# Patient Record
Sex: Female | Born: 1950
Health system: Southern US, Community
[De-identification: ages and names within clinical notes are randomized; demographics above are authoritative.]

## PROBLEM LIST (undated history)

## (undated) DIAGNOSIS — Z8639 Personal history of other endocrine, nutritional and metabolic disease: Secondary | ICD-10-CM

## (undated) DIAGNOSIS — E039 Hypothyroidism, unspecified: Secondary | ICD-10-CM

## (undated) DIAGNOSIS — K219 Gastro-esophageal reflux disease without esophagitis: Secondary | ICD-10-CM

## (undated) DIAGNOSIS — M1990 Unspecified osteoarthritis, unspecified site: Secondary | ICD-10-CM

## (undated) DIAGNOSIS — C187 Malignant neoplasm of sigmoid colon: Secondary | ICD-10-CM

## (undated) DIAGNOSIS — Z9889 Other specified postprocedural states: Secondary | ICD-10-CM

## (undated) DIAGNOSIS — F1011 Alcohol abuse, in remission: Secondary | ICD-10-CM

## (undated) DIAGNOSIS — F419 Anxiety disorder, unspecified: Secondary | ICD-10-CM

## (undated) DIAGNOSIS — E119 Type 2 diabetes mellitus without complications: Secondary | ICD-10-CM

## (undated) DIAGNOSIS — J449 Chronic obstructive pulmonary disease, unspecified: Secondary | ICD-10-CM

## (undated) DIAGNOSIS — E785 Hyperlipidemia, unspecified: Secondary | ICD-10-CM

## (undated) DIAGNOSIS — K2970 Gastritis, unspecified, without bleeding: Secondary | ICD-10-CM

## (undated) DIAGNOSIS — IMO0001 Reserved for inherently not codable concepts without codable children: Secondary | ICD-10-CM

## (undated) DIAGNOSIS — G43909 Migraine, unspecified, not intractable, without status migrainosus: Secondary | ICD-10-CM

## (undated) DIAGNOSIS — Z8719 Personal history of other diseases of the digestive system: Secondary | ICD-10-CM

## (undated) DIAGNOSIS — R112 Nausea with vomiting, unspecified: Secondary | ICD-10-CM

## (undated) DIAGNOSIS — R0600 Dyspnea, unspecified: Secondary | ICD-10-CM

## (undated) DIAGNOSIS — F329 Major depressive disorder, single episode, unspecified: Secondary | ICD-10-CM

## (undated) DIAGNOSIS — F32A Depression, unspecified: Secondary | ICD-10-CM

## (undated) DIAGNOSIS — I10 Essential (primary) hypertension: Secondary | ICD-10-CM

## (undated) HISTORY — DX: Hyperlipidemia, unspecified: E78.5

## (undated) HISTORY — DX: Unspecified osteoarthritis, unspecified site: M19.90

## (undated) HISTORY — DX: Major depressive disorder, single episode, unspecified: F32.9

## (undated) HISTORY — DX: Gastritis, unspecified, without bleeding: K29.70

## (undated) HISTORY — DX: Malignant neoplasm of sigmoid colon: C18.7

## (undated) HISTORY — DX: Essential (primary) hypertension: I10

## (undated) HISTORY — DX: Hypothyroidism, unspecified: E03.9

## (undated) HISTORY — PX: TONSILLECTOMY: SUR1361

## (undated) HISTORY — DX: Chronic obstructive pulmonary disease, unspecified: J44.9

## (undated) HISTORY — DX: Gastro-esophageal reflux disease without esophagitis: K21.9

## (undated) HISTORY — DX: Alcohol abuse, in remission: F10.11

## (undated) HISTORY — DX: Migraine, unspecified, not intractable, without status migrainosus: G43.909

## (undated) HISTORY — DX: Reserved for inherently not codable concepts without codable children: IMO0001

## (undated) HISTORY — PX: APPENDECTOMY: SHX54

## (undated) HISTORY — DX: Depression, unspecified: F32.A

---

## 1990-08-02 HISTORY — PX: ABDOMINAL HYSTERECTOMY: SHX81

## 1995-08-03 HISTORY — PX: SHOULDER SURGERY: SHX246

## 1998-11-04 ENCOUNTER — Ambulatory Visit (HOSPITAL_COMMUNITY): Admission: RE | Admit: 1998-11-04 | Discharge: 1998-11-04 | Payer: Self-pay | Admitting: Emergency Medicine

## 1999-06-02 ENCOUNTER — Encounter (INDEPENDENT_AMBULATORY_CARE_PROVIDER_SITE_OTHER): Payer: Self-pay | Admitting: Specialist

## 1999-06-02 ENCOUNTER — Ambulatory Visit (HOSPITAL_COMMUNITY): Admission: RE | Admit: 1999-06-02 | Discharge: 1999-06-03 | Payer: Self-pay

## 2001-04-20 ENCOUNTER — Other Ambulatory Visit: Admission: RE | Admit: 2001-04-20 | Discharge: 2001-04-20 | Payer: Self-pay | Admitting: Obstetrics and Gynecology

## 2002-01-13 ENCOUNTER — Inpatient Hospital Stay (HOSPITAL_COMMUNITY): Admission: AD | Admit: 2002-01-13 | Discharge: 2002-01-16 | Payer: Self-pay | Admitting: Orthopedic Surgery

## 2002-08-21 ENCOUNTER — Other Ambulatory Visit: Admission: RE | Admit: 2002-08-21 | Discharge: 2002-08-21 | Payer: Self-pay | Admitting: Obstetrics and Gynecology

## 2003-09-12 ENCOUNTER — Emergency Department (HOSPITAL_COMMUNITY): Admission: EM | Admit: 2003-09-12 | Discharge: 2003-09-13 | Payer: Self-pay | Admitting: Emergency Medicine

## 2004-08-02 HISTORY — PX: COLON RESECTION: SHX5231

## 2005-06-01 ENCOUNTER — Encounter: Admission: RE | Admit: 2005-06-01 | Discharge: 2005-06-01 | Payer: Self-pay | Admitting: Emergency Medicine

## 2005-07-02 DIAGNOSIS — C187 Malignant neoplasm of sigmoid colon: Secondary | ICD-10-CM

## 2005-07-02 HISTORY — DX: Malignant neoplasm of sigmoid colon: C18.7

## 2005-07-14 ENCOUNTER — Encounter: Admission: RE | Admit: 2005-07-14 | Discharge: 2005-07-14 | Payer: Self-pay | Admitting: Gastroenterology

## 2005-08-02 HISTORY — PX: CHOLECYSTECTOMY: SHX55

## 2005-08-09 ENCOUNTER — Encounter: Admission: RE | Admit: 2005-08-09 | Discharge: 2005-08-18 | Payer: Self-pay | Admitting: Surgery

## 2005-08-12 ENCOUNTER — Inpatient Hospital Stay (HOSPITAL_COMMUNITY): Admission: RE | Admit: 2005-08-12 | Discharge: 2005-08-18 | Payer: Self-pay | Admitting: Surgery

## 2005-08-12 ENCOUNTER — Encounter (INDEPENDENT_AMBULATORY_CARE_PROVIDER_SITE_OTHER): Payer: Self-pay | Admitting: Specialist

## 2005-08-17 ENCOUNTER — Ambulatory Visit: Payer: Self-pay | Admitting: Oncology

## 2005-08-23 ENCOUNTER — Ambulatory Visit (HOSPITAL_COMMUNITY): Admission: RE | Admit: 2005-08-23 | Discharge: 2005-08-23 | Payer: Self-pay | Admitting: Surgery

## 2005-09-03 ENCOUNTER — Ambulatory Visit: Payer: Self-pay | Admitting: Oncology

## 2006-03-16 ENCOUNTER — Ambulatory Visit: Payer: Self-pay | Admitting: Oncology

## 2006-03-21 LAB — COMPREHENSIVE METABOLIC PANEL
Albumin: 4.4 g/dL (ref 3.5–5.2)
Alkaline Phosphatase: 88 U/L (ref 39–117)
CO2: 25 mEq/L (ref 19–32)
Glucose, Bld: 96 mg/dL (ref 70–99)
Potassium: 4 mEq/L (ref 3.5–5.3)
Sodium: 139 mEq/L (ref 135–145)
Total Protein: 7.3 g/dL (ref 6.0–8.3)

## 2006-03-21 LAB — CBC WITH DIFFERENTIAL/PLATELET
Eosinophils Absolute: 0.1 10*3/uL (ref 0.0–0.5)
MONO#: 0.7 10*3/uL (ref 0.1–0.9)
MONO%: 5.5 % (ref 0.0–13.0)
NEUT#: 7.1 10*3/uL — ABNORMAL HIGH (ref 1.5–6.5)
RBC: 4.56 10*6/uL (ref 3.70–5.32)
RDW: 12.3 % (ref 11.3–14.5)
WBC: 12.2 10*3/uL — ABNORMAL HIGH (ref 3.9–10.0)

## 2006-03-21 LAB — LACTATE DEHYDROGENASE: LDH: 179 U/L (ref 94–250)

## 2006-09-14 ENCOUNTER — Ambulatory Visit: Payer: Self-pay | Admitting: Oncology

## 2007-08-03 HISTORY — PX: SHOULDER ARTHROSCOPY: SHX128

## 2008-08-02 HISTORY — PX: HAND SURGERY: SHX662

## 2009-01-02 ENCOUNTER — Ambulatory Visit: Payer: Self-pay | Admitting: Oncology

## 2009-01-06 ENCOUNTER — Ambulatory Visit (HOSPITAL_COMMUNITY): Admission: RE | Admit: 2009-01-06 | Discharge: 2009-01-06 | Payer: Self-pay | Admitting: Oncology

## 2009-01-06 LAB — CBC WITH DIFFERENTIAL/PLATELET
BASO%: 0.3 % (ref 0.0–2.0)
Basophils Absolute: 0 10*3/uL (ref 0.0–0.1)
Eosinophils Absolute: 0.2 10*3/uL (ref 0.0–0.5)
HGB: 14.7 g/dL (ref 11.6–15.9)
LYMPH%: 36.7 % (ref 14.0–49.7)
MCH: 32.7 pg (ref 25.1–34.0)
MCHC: 34.8 g/dL (ref 31.5–36.0)
MCV: 93.8 fL (ref 79.5–101.0)
MONO%: 6.3 % (ref 0.0–14.0)
NEUT#: 5.6 10*3/uL (ref 1.5–6.5)
Platelets: 232 10*3/uL (ref 145–400)
RBC: 4.5 10*6/uL (ref 3.70–5.45)
lymph#: 3.7 10*3/uL — ABNORMAL HIGH (ref 0.9–3.3)

## 2009-01-06 LAB — CEA: CEA: 7.7 ng/mL — ABNORMAL HIGH (ref 0.0–5.0)

## 2009-01-06 LAB — COMPREHENSIVE METABOLIC PANEL
Alkaline Phosphatase: 78 U/L (ref 39–117)
BUN: 12 mg/dL (ref 6–23)
Glucose, Bld: 106 mg/dL — ABNORMAL HIGH (ref 70–99)
Potassium: 3.5 mEq/L (ref 3.5–5.3)
Total Bilirubin: 0.3 mg/dL (ref 0.3–1.2)

## 2009-01-06 LAB — LACTATE DEHYDROGENASE: LDH: 161 U/L (ref 94–250)

## 2009-02-06 ENCOUNTER — Ambulatory Visit: Payer: Self-pay | Admitting: Oncology

## 2009-02-13 ENCOUNTER — Ambulatory Visit (HOSPITAL_COMMUNITY): Admission: RE | Admit: 2009-02-13 | Discharge: 2009-02-13 | Payer: Self-pay | Admitting: Oncology

## 2009-03-13 ENCOUNTER — Ambulatory Visit: Payer: Self-pay | Admitting: Oncology

## 2009-03-17 LAB — COMPREHENSIVE METABOLIC PANEL
AST: 27 U/L (ref 0–37)
Alkaline Phosphatase: 71 U/L (ref 39–117)
Glucose, Bld: 121 mg/dL — ABNORMAL HIGH (ref 70–99)
Sodium: 141 mEq/L (ref 135–145)
Total Bilirubin: 0.4 mg/dL (ref 0.3–1.2)
Total Protein: 6.8 g/dL (ref 6.0–8.3)

## 2009-03-17 LAB — CBC WITH DIFFERENTIAL/PLATELET
BASO%: 0.4 % (ref 0.0–2.0)
Basophils Absolute: 0 10*3/uL (ref 0.0–0.1)
EOS%: 1.9 % (ref 0.0–7.0)
MCH: 34 pg (ref 25.1–34.0)
MCHC: 34.7 g/dL (ref 31.5–36.0)
MCV: 97.9 fL (ref 79.5–101.0)
MONO%: 6.6 % (ref 0.0–14.0)
RBC: 3.98 10*6/uL (ref 3.70–5.45)
RDW: 14.1 % (ref 11.2–14.5)

## 2009-05-09 ENCOUNTER — Ambulatory Visit: Payer: Self-pay | Admitting: Oncology

## 2009-05-13 LAB — CBC WITH DIFFERENTIAL/PLATELET
BASO%: 0.4 % (ref 0.0–2.0)
Basophils Absolute: 0 10*3/uL (ref 0.0–0.1)
EOS%: 1.3 % (ref 0.0–7.0)
HGB: 14.8 g/dL (ref 11.6–15.9)
MCH: 34.3 pg — ABNORMAL HIGH (ref 25.1–34.0)
MCHC: 35.2 g/dL (ref 31.5–36.0)
MCV: 97.6 fL (ref 79.5–101.0)
MONO%: 6 % (ref 0.0–14.0)
RDW: 12.5 % (ref 11.2–14.5)
lymph#: 4 10*3/uL — ABNORMAL HIGH (ref 0.9–3.3)

## 2009-05-13 LAB — COMPREHENSIVE METABOLIC PANEL
ALT: 17 U/L (ref 0–35)
AST: 25 U/L (ref 0–37)
Albumin: 4.3 g/dL (ref 3.5–5.2)
Alkaline Phosphatase: 79 U/L (ref 39–117)
BUN: 10 mg/dL (ref 6–23)
Chloride: 99 mEq/L (ref 96–112)
Creatinine, Ser: 0.93 mg/dL (ref 0.40–1.20)
Potassium: 3.3 mEq/L — ABNORMAL LOW (ref 3.5–5.3)

## 2009-07-10 ENCOUNTER — Ambulatory Visit: Payer: Self-pay | Admitting: Oncology

## 2009-07-14 LAB — COMPREHENSIVE METABOLIC PANEL
ALT: 22 U/L (ref 0–35)
CO2: 32 mEq/L (ref 19–32)
Calcium: 9.2 mg/dL (ref 8.4–10.5)
Chloride: 97 mEq/L (ref 96–112)
Creatinine, Ser: 0.8 mg/dL (ref 0.40–1.20)
Glucose, Bld: 135 mg/dL — ABNORMAL HIGH (ref 70–99)
Total Bilirubin: 0.5 mg/dL (ref 0.3–1.2)
Total Protein: 7.3 g/dL (ref 6.0–8.3)

## 2009-07-14 LAB — CBC WITH DIFFERENTIAL/PLATELET
BASO%: 0.6 % (ref 0.0–2.0)
Eosinophils Absolute: 0.2 10*3/uL (ref 0.0–0.5)
HCT: 42.6 % (ref 34.8–46.6)
HGB: 14.9 g/dL (ref 11.6–15.9)
LYMPH%: 37.1 % (ref 14.0–49.7)
MCHC: 35 g/dL (ref 31.5–36.0)
MONO#: 0.8 10*3/uL (ref 0.1–0.9)
NEUT#: 6.3 10*3/uL (ref 1.5–6.5)
NEUT%: 53.7 % (ref 38.4–76.8)
Platelets: 276 10*3/uL (ref 145–400)
WBC: 11.7 10*3/uL — ABNORMAL HIGH (ref 3.9–10.3)
lymph#: 4.3 10*3/uL — ABNORMAL HIGH (ref 0.9–3.3)

## 2009-07-14 LAB — CEA: CEA: 8.2 ng/mL — ABNORMAL HIGH (ref 0.0–5.0)

## 2009-07-14 LAB — LACTATE DEHYDROGENASE: LDH: 155 U/L (ref 94–250)

## 2009-10-27 ENCOUNTER — Ambulatory Visit (HOSPITAL_COMMUNITY)
Admission: RE | Admit: 2009-10-27 | Discharge: 2009-10-27 | Payer: Self-pay | Source: Home / Self Care | Admitting: Gastroenterology

## 2009-11-11 ENCOUNTER — Ambulatory Visit: Payer: Self-pay | Admitting: Oncology

## 2009-11-13 LAB — CBC WITH DIFFERENTIAL/PLATELET
BASO%: 0.3 % (ref 0.0–2.0)
Basophils Absolute: 0 10*3/uL (ref 0.0–0.1)
EOS%: 1.3 % (ref 0.0–7.0)
Eosinophils Absolute: 0.1 10*3/uL (ref 0.0–0.5)
HCT: 43 % (ref 34.8–46.6)
HGB: 15 g/dL (ref 11.6–15.9)
LYMPH%: 36.7 % (ref 14.0–49.7)
MCH: 34.3 pg — ABNORMAL HIGH (ref 25.1–34.0)
MCHC: 34.9 g/dL (ref 31.5–36.0)
MCV: 98.2 fL (ref 79.5–101.0)
MONO#: 0.7 10*3/uL (ref 0.1–0.9)
MONO%: 6.4 % (ref 0.0–14.0)
NEUT#: 6.1 10*3/uL (ref 1.5–6.5)
NEUT%: 55.3 % (ref 38.4–76.8)
Platelets: 286 10*3/uL (ref 145–400)
RBC: 4.38 10*6/uL (ref 3.70–5.45)
RDW: 12.5 % (ref 11.2–14.5)
WBC: 11.1 10*3/uL — ABNORMAL HIGH (ref 3.9–10.3)
lymph#: 4.1 10*3/uL — ABNORMAL HIGH (ref 0.9–3.3)

## 2009-11-13 LAB — COMPREHENSIVE METABOLIC PANEL WITH GFR
ALT: 13 U/L (ref 0–35)
AST: 25 U/L (ref 0–37)
Albumin: 4.3 g/dL (ref 3.5–5.2)
Alkaline Phosphatase: 78 U/L (ref 39–117)
BUN: 12 mg/dL (ref 6–23)
CO2: 26 meq/L (ref 19–32)
Calcium: 9.3 mg/dL (ref 8.4–10.5)
Chloride: 99 meq/L (ref 96–112)
Creatinine, Ser: 0.97 mg/dL (ref 0.40–1.20)
Glucose, Bld: 153 mg/dL — ABNORMAL HIGH (ref 70–99)
Potassium: 3.6 meq/L (ref 3.5–5.3)
Sodium: 138 meq/L (ref 135–145)
Total Bilirubin: 0.4 mg/dL (ref 0.3–1.2)
Total Protein: 7 g/dL (ref 6.0–8.3)

## 2009-11-13 LAB — CEA: CEA: 9 ng/mL — ABNORMAL HIGH (ref 0.0–5.0)

## 2009-11-13 LAB — LACTATE DEHYDROGENASE: LDH: 170 U/L (ref 94–250)

## 2010-05-12 ENCOUNTER — Ambulatory Visit: Payer: Self-pay | Admitting: Oncology

## 2010-06-12 ENCOUNTER — Ambulatory Visit: Payer: Self-pay | Admitting: Oncology

## 2010-06-15 LAB — CBC WITH DIFFERENTIAL/PLATELET
BASO%: 0.4 % (ref 0.0–2.0)
Basophils Absolute: 0 10*3/uL (ref 0.0–0.1)
EOS%: 0.5 % (ref 0.0–7.0)
Eosinophils Absolute: 0.1 10*3/uL (ref 0.0–0.5)
HCT: 40.2 % (ref 34.8–46.6)
HGB: 14.1 g/dL (ref 11.6–15.9)
LYMPH%: 24.2 % (ref 14.0–49.7)
MCH: 33.8 pg (ref 25.1–34.0)
MCHC: 35.2 g/dL (ref 31.5–36.0)
MCV: 96 fL (ref 79.5–101.0)
MONO#: 0.7 10*3/uL (ref 0.1–0.9)
MONO%: 5.8 % (ref 0.0–14.0)
NEUT#: 8.6 10*3/uL — ABNORMAL HIGH (ref 1.5–6.5)
NEUT%: 69.1 % (ref 38.4–76.8)
Platelets: 265 10*3/uL (ref 145–400)
RBC: 4.18 10*6/uL (ref 3.70–5.45)
RDW: 13.1 % (ref 11.2–14.5)
WBC: 12.4 10*3/uL — ABNORMAL HIGH (ref 3.9–10.3)
lymph#: 3 10*3/uL (ref 0.9–3.3)

## 2010-06-15 LAB — COMPREHENSIVE METABOLIC PANEL
ALT: 19 U/L (ref 0–35)
AST: 37 U/L (ref 0–37)
Albumin: 3.9 g/dL (ref 3.5–5.2)
Alkaline Phosphatase: 74 U/L (ref 39–117)
BUN: 11 mg/dL (ref 6–23)
CO2: 28 mEq/L (ref 19–32)
Calcium: 9.3 mg/dL (ref 8.4–10.5)
Chloride: 100 mEq/L (ref 96–112)
Creatinine, Ser: 1.01 mg/dL (ref 0.40–1.20)
Glucose, Bld: 117 mg/dL — ABNORMAL HIGH (ref 70–99)
Potassium: 3.4 mEq/L — ABNORMAL LOW (ref 3.5–5.3)
Sodium: 140 mEq/L (ref 135–145)
Total Bilirubin: 0.4 mg/dL (ref 0.3–1.2)
Total Protein: 7.1 g/dL (ref 6.0–8.3)

## 2010-06-15 LAB — CEA: CEA: 9.3 ng/mL — ABNORMAL HIGH (ref 0.0–5.0)

## 2010-06-15 LAB — LACTATE DEHYDROGENASE: LDH: 207 U/L (ref 94–250)

## 2010-08-02 HISTORY — PX: OTHER SURGICAL HISTORY: SHX169

## 2010-09-01 LAB — CBC
Hemoglobin: 15.1 g/dL — ABNORMAL HIGH (ref 12.0–15.0)
MCH: 32.5 pg (ref 26.0–34.0)
MCHC: 33.8 g/dL (ref 30.0–36.0)
MCV: 96.1 fL (ref 78.0–100.0)
Platelets: 257 10*3/uL (ref 150–400)
RBC: 4.65 MIL/uL (ref 3.87–5.11)
RDW: 12.7 % (ref 11.5–15.5)

## 2010-09-01 LAB — URINALYSIS, ROUTINE W REFLEX MICROSCOPIC
Hgb urine dipstick: NEGATIVE
Ketones, ur: NEGATIVE mg/dL
Nitrite: NEGATIVE
Protein, ur: NEGATIVE mg/dL
Urine Glucose, Fasting: NEGATIVE mg/dL

## 2010-09-01 LAB — BASIC METABOLIC PANEL
BUN: 8 mg/dL (ref 6–23)
CO2: 31 mEq/L (ref 19–32)
Calcium: 9.6 mg/dL (ref 8.4–10.5)
Chloride: 99 mEq/L (ref 96–112)
Creatinine, Ser: 0.81 mg/dL (ref 0.4–1.2)
GFR calc Af Amer: >60 mL/min (ref >60)
GFR calc non Af Amer: >60 mL/min (ref >60)
Glucose, Bld: 98 mg/dL (ref 70–99)
Sodium: 140 mEq/L (ref 135–145)

## 2010-09-01 LAB — DIFFERENTIAL
Basophils Absolute: 0 10*3/uL (ref 0.0–0.1)
Basophils Relative: 0 % (ref 0–1)
Eosinophils Absolute: 0.2 10*3/uL (ref 0.0–0.7)
Eosinophils Relative: 2 % (ref 0–5)
Lymphocytes Relative: 43 % (ref 12–46)
Lymphs Abs: 3.8 10*3/uL (ref 0.7–4.0)
Monocytes Absolute: 0.7 10*3/uL (ref 0.1–1.0)
Monocytes Relative: 7 % (ref 3–12)
Neutro Abs: 4.2 10*3/uL (ref 1.7–7.7)
Neutrophils Relative %: 47 % (ref 43–77)

## 2010-09-01 LAB — SURGICAL PCR SCREEN: Staphylococcus aureus: POSITIVE — AB

## 2010-09-01 LAB — PROTIME-INR: Prothrombin Time: 12.9 seconds (ref 11.6–15.2)

## 2010-09-10 ENCOUNTER — Inpatient Hospital Stay (HOSPITAL_COMMUNITY)
Admission: RE | Admit: 2010-09-10 | Discharge: 2010-09-15 | DRG: 355 | Disposition: A | Discharge status: Home / Self Care | Payer: Self-pay | Source: Ambulatory Visit | Attending: Surgery | Admitting: Surgery

## 2010-09-10 DIAGNOSIS — F17200 Nicotine dependence, unspecified, uncomplicated: Secondary | ICD-10-CM | POA: Diagnosis present

## 2010-09-10 DIAGNOSIS — Z859 Personal history of malignant neoplasm, unspecified: Secondary | ICD-10-CM

## 2010-09-10 DIAGNOSIS — Z9049 Acquired absence of other specified parts of digestive tract: Secondary | ICD-10-CM

## 2010-09-10 DIAGNOSIS — I10 Essential (primary) hypertension: Secondary | ICD-10-CM | POA: Diagnosis present

## 2010-09-10 DIAGNOSIS — K430 Incisional hernia with obstruction, without gangrene: Principal | ICD-10-CM | POA: Diagnosis present

## 2010-09-24 NOTE — Op Note (Signed)
[name], [name]               ACCOUNT NO.:  [id number]  MEDICAL RECORD NO.:  [id number]           PATIENT TYPE:  I  LOCATION:  1534                         FACILITY:  [facility]  PHYSICIAN:  Velora Heckler, MD      DATE OF BIRTH:  1951/07/11  DATE OF PROCEDURE:  09/10/2010 DATE OF DISCHARGE:                              OPERATIVE REPORT   PREOPERATIVE DIAGNOSIS:  Ventral incisional hernia, incarcerated.  POSTOPERATIVE DIAGNOSIS:  Ventral incisional hernia, incarcerated.  PROCEDURE:  Repair ventral incisional hernia with Ethicon Ultrapro mesh.  SURGEON:  Velora Heckler, MD  ANESTHESIA:  General per Dr. Lucille Passy.  ESTIMATED BLOOD LOSS:  Minimal.  PREPARATION:  ChloraPrep.  COMPLICATIONS:  None.  INDICATIONS:  The patient is a 60 year old white female from [place], [place].  She underwent sigmoid colectomy in January 2007 for a T2 adenocarcinoma.  Over the past one and a half years, the patient has developed an incisional hernia in the upper midline incision, which is gradually increased in size.  She has developed moderate pain.  She underwent colonoscopy by Dr. Vida Rigger, which was essentially unremarkable.  The patient now comes to surgery for repair of ventral incisional hernia with mesh.  BODY REPORT:  Procedure was done in OR #6 at the [facility].  The patient was brought to the operating room, placed in the supine position on the operating room table.  Following administration of general anesthesia, the patient was prepped and draped in the usual strict aseptic fashion.  After ascertaining that an adequate level of anesthesia have been achieved, the midline incision was reopened with a #10 blade.  Dissection was carried down to subcutaneous tissues.  Hernia sac was opened.  It contains incarcerated omentum and a few loops of small bowel.  These were mobilized and reduced back within the peritoneal cavity.  Adhesions were then  lyse to the anterior abdominal wall circumferentially.  Good hemostasis was noted.  Hernia sacs were removed from the subcutaneous space, excised, and discarded.  The fascial plane was developed circumferentially with the electrocautery, elevating skin flaps widely.  Relaxing incisions were made in the anterior rectus sheath bilaterally.  Midline wound was then closed with interrupted #1 Novafil simple sutures.  A sheet of Ethicon Ultrapro mesh measuring 6 inches x 6 inches was placed over the midline incision.  It is used in its entirety.  It is secured circumferentially with interrupted 0 Novafil simple sutures.  Good hemostasis was noted.  Two 73 French Blake drains were brought in from inferior stab wounds and placed over the mesh.  They are secured to the skin with 2-0 nylon sutures.  Subcutaneous tissues were closed with interrupted 2-0 Vicryl sutures.  Skin was closed with stainless steel staples.  Drains were placed to bulb suction.  Sterile dressings were applied.  The patient was awakened from anesthesia and moved to the stretcher.  An abdominal binder was placed on the patient once she is on the stretcher.  The patient was then brought to the recovery room in stable condition.  The patient tolerated the procedure well.  Velora Heckler, MD     TMG/MEDQ  D:  09/10/2010  T:  09/11/2010  Job:  [id number]  cc:   Samul Dada, M.D. Fax: [phone]  Petra Kuba, M.D. Fax: [phone]  Electronically Signed by Darnell Level MD on 09/24/2010 10:32:39 AM

## 2010-09-24 NOTE — Discharge Summary (Signed)
  [name], [name]               ACCOUNT NO.:  [id number]  MEDICAL RECORD NO.:  [id number]           PATIENT TYPE:  I  LOCATION:  1534                         FACILITY:  [facility]  PHYSICIAN:  Velora Heckler, MD      DATE OF BIRTH:  09/09/1950  DATE OF ADMISSION:  09/10/2010 DATE OF DISCHARGE:                              DISCHARGE SUMMARY   REASON FOR ADMISSION:  Ventral incisional hernia.  BRIEF HISTORY:  The patient is a 60 year old white female from [place], [place].  She had undergone sigmoid colectomy for a T2 adenocarcinoma in January 2007.  Over the past 1-1/2 years, she has developed an incisional hernia in the upper midline incision.  She now comes to surgery for repair.  HOSPITAL COURSE:  The patient was admitted on the surgical service and taken to the operating room on February 9.  She underwent repair of ventral incisional hernia with Ethicon Ultrapro mesh.  Postoperative course was relatively straightforward.  She had some initial nausea and vomiting.  This responded to treatment.  She remained hydrated with intravenous fluids.  She began clear liquid diet and advanced to a regular diet.  She had return of bowel function with bowel movements. She was instructed in care of her abdominal drains.  She was prepared for discharge home on the fifth postoperative day.  DISCHARGE PLAN:  The patient is discharged home today, September 15, 2010 in good condition, tolerating a regular diet, and ambulating independently.  The patient will return to my office in 3 to 5 days for wound check and drain removal.  Discharge medications include Ultram as needed for pain.  FINAL DIAGNOSIS:  Ventral incisional hernia.  CONDITION ON DISCHARGE:  Good.     Velora Heckler, MD     TMG/MEDQ  D:  09/15/2010  T:  09/15/2010  Job:  [id number]  cc:   Samul Dada, M.D. Fax: [phone]  Petra Kuba, M.D. Fax: [phone]  Electronically Signed by Darnell Level MD on  09/24/2010 10:32:34 AM

## 2010-12-18 NOTE — Consult Note (Signed)
[name], [name]               ACCOUNT NO.:  [id number]   MEDICAL RECORD NO.:  [id number]          PATIENT TYPE:  INP   LOCATION:  1405                           FACILITY:   PHYSICIAN:  Georgann Housekeeper, MD      DATE OF BIRTH:  08-Aug-1950   DATE OF CONSULTATION:  08/15/2005  DATE OF DISCHARGE:                                   CONSULTATION   PRIMARY CARE PHYSICIAN:  Oley Balm. Georgina Pillion, M.D.   REQUESTING PHYSICIAN:  Lebron Conners, M.D. for increased heart rate.   The patient is a 60 year old with history of sigmoid adenocarcinoma, status  post hemicolectomy on August 12, 2005.   PAST MEDICAL HISTORY:  1.  Hypothyroidism.  2.  Hyperlipidemia.  3.  Active liver disease.   She was found today to have a heart rate elevated to the 140s.  The patient  was asymptomatic, denies any chest pain or shortness of breath.  She felt a  little palpitations of the heart and the chest but no dizziness or chest  pain or shortness of breath.  She denies any nausea or vomiting.  She had  intermittent pain but she was getting nauseous with the morphine, so  morphine was taken off.  Her heart rate again went back to normal to 84 on  its own.  As far as her significant lab data while potassium was 2.8.  Her  blood counts and hemoglobin were stable.  Creatinine is 0.7, calcium is 9.   SURGICAL HISTORY:  Status post hemicolectomy on August 12, 2005 for sigmoid  adenocarcinoma on the surgical service.   MEDICATIONS:  1.  Currently on Protonix 40 mg daily.  2.  Elavil 100 mg daily.  3.  Vicodin p.r.n.   She has been on Synthroid at home and has not been on it here and also takes  Zocor at home.  As far as lisinopril and hydrochlorothiazide at home, has  not been required here.   On exam today, blood pressure was 139/90, heart rate 80.  Lungs were clear.  Cardiac exam:  Regular S1 and S2 without any murmurs.  Abdomen was soft.  Extremities:  No edema.   EKG showed initially sinus tachycardia at  146 with short PR.  No ST or T  wave changes in the morning.  EKG now, sinus rhythm at 84.  No ST or T wave  changes.   IMPRESSION:  A 60 year old with status post hemicolectomy with tachycardia.  The differential diagnosis includes either sinus tachycardia with  supraventricular tachycardia.  Currently, no pain.   PLAN/RECOMMENDATIONS:  Transfer to telemetry to follow her heart rate and  blood pressure closely.  Check cardiac markers and TSH.  IV Lopressor p.r.n.  if needed.  Hospitalist, Dr. Lorenz Coaster will follow.      Georgann Housekeeper, MD  Electronically Signed     KH/MEDQ  D:  08/15/2005  T:  08/16/2005  Job:  [id number]   cc:   Oley Balm. Georgina Pillion, M.D.  Fax: [phone]   Dr. Lorenz Coaster, Hospitalist

## 2010-12-18 NOTE — Consult Note (Signed)
[name], [name]               ACCOUNT NO.:  [id number]   MEDICAL RECORD NO.:  [id number]          PATIENT TYPE:  INP   LOCATION:  1405                         FACILITY:  [facility]   PHYSICIAN:  Samul Dada, M.D.DATE OF BIRTH:  1950/08/16   DATE OF CONSULTATION:  08/17/2005  DATE OF DISCHARGE:  08/18/2005                                   CONSULTATION   REASON FOR CONSULTATION:  Colon cancer.   REFERRING PHYSICIAN:  Velora Heckler, M.D.   HISTORY OF PRESENT ILLNESS:  [name] is a pleasant 60 year old woman  found to have a sigmoid mass during a screening colonoscopy, which was  resected endoscopically on July 08, 2005.  Fragments of that mass were  sent to pathology, which were positive for moderately differentiated  adenocarcinoma arising from the tubulovillous adenoma.  She was referred  then to Dr. Gerrit Friends for a sigmoid colectomy, which was performed on August 12, 2005.  Pathology report case #[id number], Dr. Delila Spence, revealed invasive  moderately differentiated colonic adenocarcinoma, grade II, T2, N0, MX,  maximum tumor size 2.5 cm, negative proximal, distal and radial margins.  The distance of the carcinoma from the nearest margin is 3.5 cm distal.  No  visceral perforation.  The tumor invades focally the muscular ispropria.  No  vascular or lymphatic invasive.  Of 11 lymph nodes, all were negative.  We  were asked to see the patient with recommendations.   PAST MEDICAL HISTORY:  1.  Hypothyroidism.  2.  Hyperlipidemia.  3.  Asthma.  4.  History of tobacco abuse.  5.  History of migraine headaches.  6.  GERD.   SURGERIES:  1.  Status post sigmoid colectomy on August 12, 2005 by Dr. Gerrit Friends.  2.  Status post cholecystectomy by Dr. Felipa Eth in 2000.  3.  Status post hysterectomy in 1981 for fibroids.  4.  Status post tonsillectomy.  5.  Status post rotator cuff repair.  6.  Status post left breast biopsy for a benign mass, remote.  7.  Status post debridement  of the right index finger secondary to a dog      bite in June 2003.   ALLERGIES:  1.  PENICILLIN.  2.  SULFA.  3.  'MYCINS.  4.  CODEINE.   CURRENT MEDICATIONS:  1.  Elavil 100 mg q.h.s.  2.  K-Dur 20 mEq daily.  3.  Lortabs q.4h. p.r.n.  4.  Ativan q.8h. p.r.n.  5.  Lopressor 5 mg IV p.r.n.  6.  Zofran IV p.r.n.  7.  Imitrex 20 mg nasal spray p.r.n.   REVIEW OF SYSTEMS:  Remarkable for fatigue, dyspnea on exertion which is  improved since admission.  She has recent palpitations in the hospital,  requiring cardiovascular evaluation, which is now controlled.  No nausea or  vomiting.  She had diarrhea for several years.  She states that she had what  is called spastic colon since age 85.  No diarrhea at this time.  No blood  in the stools, dysuria or gross hematuria.  No peripheral edema.  No  __________.   FAMILY  HISTORY:  Mother alive in good health.  Father died with cancer of  unknown type.  One sister with diabetes.   SOCIAL HISTORY:  The patient is single.  She has a housemate.  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  She smokes two packs of tobacco a day for at least  35 years or more.  She lives in [place].  Her last physical was about 5  years ago.  Her last mammogram was also 5 years ago, negative for  malignancy.   PHYSICAL EXAMINATION:  GENERAL:  This is an obese 60 year old white female  in no acute distress, alert and oriented x3, who appears older than her  stated age.  VITAL SIGNS:  Blood pressure 144/91, pulse 83, respirations 20, temperature  98.7, pulse oximetry 95% on room air, weight 190 pounds, height 65 inches.  HEENT:  Normocephalic and atraumatic.  Pupils equal, round and reactive to  light and accommodation.  Mucous membranes without thrush or lesions.  NECK:  Supple.  No cervical or supraclavicular masses.  LUNGS:  Clear to auscultation bilaterally.  No axillary masses.  BREASTS:  Without masses.  CARDIOVASCULAR:  Regular rate and rhythm  without murmurs, rubs or gallops.  ABDOMEN:  Soft.  Tender at the incision site.  Bowel sounds x4.  Cannot  palpate the liver border.  No splenomegaly.  GU/RECTAL:  Deferred.  EXTREMITIES:  No clubbing, no cyanosis, no edema.  SKIN:  Without lesions.  No bruising or petechiae.  NEUROLOGIC:  Nonfocal.   LABORATORY DATA:  Hemoglobin 13.8, hematocrit 39.5, white count 13.2,  platelets 350, neutrophils 10.2, MCV 96.3.  PT 13, INR 1.0.  LDH 181.  TSH  2.728.  CEA on July 13, 2005 was 9.2.  Sodium 141, potassium 3.3, BUN 2,  creatinine 0.8, glucose 138.  Total bilirubin 0.7.  Alkaline phosphatase 80,  AST 22, ALT 17, total protein 6.0, albumin 2.8, calcium 8.9, uric acid 3.8.   RADIOLOGY STUDIES:  CT of the chest is currently pending.  CT of the abdomen  on July 14, 2005 shows no metastases, borderline hepatomegaly with  possible hepatic steatosis.  CT of the pelvis shows no acute process.  The  primary lesion is not identified.  No transcolonic spread.  Abnormal  appearance of the anterior-superior bladder.  It could be related to  __________ after hysterectomy.   ASSESSMENT AND PLAN:  A 60 year old white female whom we were asked to see  for evaluation of a T2, N0 sigmoid colon cancer, stage I.  At this time, no  treatment is indicated.  Will repeat her CEA.  The patient will need a  follow up visit in about 3-4 months, and an appointment will be set up with  Dr. Arline Asp, who will be seeing the patient at the [facility]  at that time.  The patient needs to work on smoking cessation.   Thank you very much for allowing [place] the opportunity to participate in the  care of Ms. Conchas.      Marlowe Kays, P.A.      Samul Dada, M.D.  Electronically Signed    SW/MEDQ  D:  08/18/2005  T:  08/18/2005  Job:  [id number]   cc:   Velora Heckler, MD  [address]  [place]  [place] [address]   Petra Kuba, M.D. Fax: [phone]

## 2010-12-18 NOTE — Op Note (Signed)
[name], [name]               ACCOUNT NO.:  [id number]   MEDICAL RECORD NO.:  [id number]          PATIENT TYPE:  INP   LOCATION:  0009                         FACILITY:  [facility]   PHYSICIAN:  Velora Heckler, MD      DATE OF BIRTH:  01/01/1951   DATE OF PROCEDURE:  08/12/2005  DATE OF DISCHARGE:                                 OPERATIVE REPORT   PREOPERATIVE DIAGNOSIS:  Sigmoid colon carcinoma.   POSTOPERATIVE DIAGNOSIS:  Sigmoid colon carcinoma.   PROCEDURE:  Sigmoid colectomy.   SURGEON:  Velora Heckler, MD, FACS   ASSISTANT:  Wilmon Arms. Corliss Skains, MD, FACS   ANESTHESIA:  General by Dr. Eilene Ghazi.   ESTIMATED BLOOD LOSS:  Less than 100 mL.   PREPARATION:  Betadine.   COMPLICATIONS:  None.   INDICATIONS:  The patient is 60 year old white female from [place], [place] referred by Dr. Vida Rigger with newly diagnosed carcinoma of the  sigmoid colon. The patient underwent screening colonoscopy July 08, 2005.  She was noted to have multiple benign polyps. There was a dominant mass.  However in the sigmoid colon. This was partially resected endoscopically.  Fragments reviewed by pathology showed moderately differentiated  adenocarcinoma arising in a tubulovillous adenoma. The patient was referred  for resection.   DESCRIPTION OF PROCEDURE:  The procedure was done in OR #11 at the [facility]. The patient is brought to the operating room,  placed in a supine position on the operating room table. Following the  administration of general anesthesia the patient is prepped and draped in  the usual strict aseptic fashion. After ascertaining that an adequate level  of anesthesia had been obtained, a midline abdominal incision is made with a  #10 blade. Dissection was carried down through the abdominal wall. The  fascia is incised in the midline and the peritoneal cavity is entered  cautiously. The abdomen is explored. The liver is normal without  palpable  mass. The small bowel appears normal. Palpation of the colon reveals a  tortuous adhesed area of the distal sigmoid colon in the pelvis. This is  adherent to the left pelvic adnexal structures. Using the electrocautery,  the peritoneum is incised along the left colic gutter and followed distally.  The sigmoid colon is completely mobilized to the rectum. Close examination  of the sigmoid colon demonstrates a mass at approximately the junction of  the middle third and distal third of the sigmoid colon. This was marked with  a 2-0 silk suture. Approximately 10 cm distal to the tumor is the peritoneal  reflection. The peritoneum is incised at this level as this will be the  distal extent of our dissection. A point approximately 15-20 cm proximal to  the tumor is selected in the proximal sigmoid colon. Mesentery is incised.  Bowel was transected between bowel clamps. The mesentery was then taken down  between [facility] clamps, divided, ligated with 2-0 silk ties and 2-0 silk  suture ligatures. Dissection is carried distally to the point at the pelvic  peritoneal reflection. Stay sutures are placed in  the proximal rectum. A  Satinsky clamp is used to occlude the distal sigmoid and the bowel is  transected with a #10 blade. The specimen is  passed off the field and  submitted to pathology for review. Good hemostasis was noted along the  mesenteric dissection. Next a end-to-end anastomosis is created between the  proximal sigmoid colon and the distal sigmoid colon. This is performed in an  end-to-end fashion with interrupted 3-0 silk sutures. Good approximation is  achieved. There is no tension on the anastomosis. There is good hemostasis  noted. The abdomen is copiously irrigated with warm saline which was  evacuated. Anastomosis is then treated with topical Tisseel  circumferentially. There is a good result. All packs are removed. Retractors  are removed. The viscera are returned to their  normal location. Omentum is  used to cover the small bowel. Midline fascia is closed with a running #1  Novofil suture. The subcutaneous tissue is irrigated and hemostasis obtained  with the electrocautery. The skin is closed with stainless steel staples.  Sterile dressings are applied. The patient is awakened from anesthesia and  brought to the recovery room in stable condition. The patient tolerated the  procedure well.      Velora Heckler, MD  Electronically Signed     TMG/MEDQ  D:  08/12/2005  T:  08/13/2005  Job:  [id number]   cc:   Petra Kuba, M.D.  Fax: [phone]   Oley Balm. Georgina Pillion, M.D.  Fax: [phone]

## 2010-12-18 NOTE — Discharge Summary (Signed)
[name], [name]               ACCOUNT NO.:  [id number]   MEDICAL RECORD NO.:  [id number]          PATIENT TYPE:  INP   LOCATION:  1405                         FACILITY:  [facility]   PHYSICIAN:  Velora Heckler, MD      DATE OF BIRTH:  01-Dec-1950   DATE OF ADMISSION:  08/12/2005  DATE OF DISCHARGE:  08/18/2005                                 DISCHARGE SUMMARY   REASON FOR ADMISSION:  Sigmoid colon carcinoma.   BRIEF HISTORY:  The patient is a 60 year old white female from [place],  [place] referred by Dr. Vida Rigger for newly diagnosed carcinoma of  the sigmoid colon. The patient had complaints of chronic diarrhea and  spastic colon. She underwent screening colonoscopy July 08, 2005. She was  found to have a dominant mass in the sigmoid colon which was partially  resected endoscopically. Pathology demonstrated moderately differentiated  adenocarcinoma arising in a tubulovillous adenoma. She was referred to  general surgery and prepared for the operating room.   HOSPITAL COURSE:  The patient was admitted on January 11th and taken  directly to the operating room where she underwent sigmoid colectomy without  complication. Postoperative course was relatively straightforward. The  patient did develop an episode of tachycardia. She was seen in consultation  by [facility] hospitalist. Concern was raised over possible pulmonary embolism. A  D-dimer level was elevated. CT scan of the chest however, on January16  demonstrated no evidence of pulmonary embolism. CT scan of the abdomen and  pelvis was also obtained due to an elevated white blood cell count. This  demonstrated no evidence of intra-abdominal abscess and no evidence of leak  from the anastomosis. The patient tolerated a regular diet and was prepared  for discharge home on the sixth postoperative day.   DISCHARGE/PLAN:  The patient's discharged home August 18, 2005 in good  condition, tolerating a regular diet, and  ambulating independently. The  patient will be seen back in my office at [facility] surgery in 5 days  for wound check and staple removal. Discharge medications include only her  home medications as per usual.   FINAL DIAGNOSIS:  Adenocarcinoma sigmoid colon.   CONDITION AT DISCHARGE:  Good.      Velora Heckler, MD  Electronically Signed     TMG/MEDQ  D:  08/18/2005  T:  08/18/2005  Job:  [id number]   cc:   Petra Kuba, M.D.  Fax: [phone]   Oley Balm. Georgina Pillion, M.D.  Fax: [phone]   Samul Dada, M.D.  Fax: [phone]

## 2010-12-18 NOTE — Discharge Summary (Signed)
[name], [name]                           ACCOUNT NO.:  [id number]   MEDICAL RECORD NO.:  [id number]                   PATIENT TYPE:   LOCATION:                                       FACILITY:   PHYSICIAN:  Katy Fitch. Sypher, M.D.              DATE OF BIRTH:  29-Mar-1951   DATE OF ADMISSION:  01/13/2002  DATE OF DISCHARGE:  01/16/2002                                 DISCHARGE SUMMARY   ADMISSION DIAGNOSIS:  1. Abscess and flexor tenosynovitis of the right hand secondary to dog bite.  2. Hypertension.  3. Hypothyroidism.  4. Asthma.  5. History of  migraines.  6. Gastroesophageal reflux disease.   DISCHARGE DIAGNOSES:  .  Abscess and flexor tenosynovitis of the right hand  secondary to dog bite.  1. Hypertension.  2. Hypothyroidism.  3. Asthma.  4. History of  migraines.  5. Gastroesophageal reflux disease.   OPERATION PERFORMED:  Incision and drainage of right hand index finger  flexor sheath and dorsal abscess on 01/13/2002 by Dr. Katy Fitch. Sypher.   HISTORY:  The patient is a 60 year old, right hand dominant, self-employed  Pension scheme manager who was bitten by her own dog on 01/11/2002 on the dorsum  of the right hand and the right index finger. This was cleaned initially by  with the patient with peroxide and salt water. It started to look infected  within two hours by the patient's account.  She was seen at [facility] the day after her bite on 01/12/2002 and started on  doxycycline 100 mg p.o. b.i.d.  She is apparently allergic to PENICILLIN.  She was rechecked in their office on 01/13/2002.  She had developed abscess  and flexor tenosynovitis and was referred to Dr. Teressa Senter for hand consult.  On exam, she had multiple bites to the right hand, index and dorsal palmar  aspect.  There was 3+ swelling of her index finger and the dorsum of the  hand.  She had 4/4 Kanavel signs of septic flexor tenosynovitis.  It was  felt that the patient would need to  be brought to the operating room to  undergo I & D and admission for intravenous antibiotics.   LABORATORY DATA:  On admission revealed a hemoglobin 15.1, hematocrit 43.6,  white blood cell count of 13.7 and 315,000 platelets, chemistry profile  within normal limits.  Intraoperative cultures eventually read out  Pasteurella multocida.   HOSPITAL COURSE:  The patient was admitted postoperatively and placed on  vancomycin per the pharmacy protocol in addition to doxycycline 100 mg p.o.  b.i.d..  Her first day postoperative she was afebrile with stable vital  signs.  She was doing well overall.  She had a headache secondary to  migraine.  Her pain was under good control, however.  She was voiding and  had a good appetite. On examination, her dressing was wet secondary to her  saline irrigation.  This was drained.  The dressing was changed and there  was less swelling.  The irrigation catheter was working well.  A new bulky  dressing was applied.  Intravenous antibiotics and intravenous irrigation  were continued.  The second day postop on 01/15/2002, she remained afebrile  with stable vital signs.  She was doing well overall.  Her pain was markedly  decreased.  She was sleeping well.  On exam, her dressing was changed again  and there was much less swelling, less erythema and no active drainage.  Her  irrigation catheter was removed without and the dressing was applied. Her  intravenous antibiotics and elevation were continued.  The patient was  instructed on active range of motion  of her digits.  Her third day postop  on 01/16/2002, she remained afebrile with stable vital signs, continued to  do well, minimal pain. She was sleeping well. Her range of motion  was  greatly improved with marked decrease in her swelling.  On exam, her  dressing was dry.  The wound was okay, there was decreased swelling and less  erythema.  Cultures at that time revealed rare gram negative rods, possibly   Pasturella multocida.   PLAN:  At this time, it was felt that the patient was stable and ready for  discharge to home.  She was given prescriptions for Percocet 5 mg #30 and  Tequin 200 mg p.o. q.d. for seven days.   WOUND CARE:  The patient will keep her dressing dry and her hand elevated.   RETURN APPOINTMENT:  She will return to see Dr. Teressa Senter in our office on  Friday 01/19/2002.  She will call for an appointment.   CONDITION ON DISCHARGE:  Stable and improving.   FINAL DIAGNOSIS:  Septic flexor tenosynovitis of the right index finger with  hand abscess requiring admission for incision and drainage and intravenous  antibiotics.         Marveen Reeks. Dasnoit, P.A.                   Katy Fitch Sypher, M.D.    RJD/MEDQ  D:  07/10/2002  T:  07/11/2002  Job:  [id number]

## 2010-12-18 NOTE — Op Note (Signed)
[facility]  Patient:    [name], [name]. Visit Number: [id number] MRN: [id number]          Service Type: Attending:  Katy Fitch. Naaman Lamb., M.D. Dictated by:   [name]., M.D. Proc. Date: 01/13/02   CC:         [name], M.D.  [name], M.D.   Operative Report  PREOPERATIVE DIAGNOSIS:  Status post dog bite on January 11, 2002 with penetrating injuries to dorsal aspect of right index finger and palmar aspect of right index finger, dorsal aspect of right long finger and incidental injury to region of wrist.  POSTOPERATIVE DIAGNOSIS:  Status post dog bite on January 11, 2002 with penetrating injuries to dorsal aspect of right index finger and palmar aspect of right index finger, dorsal aspect of right long finger and incidental injury to region of wrist.  Identification of purulent flexor tenosynovitis in both palmar and dorsal abscesses at bite marks.  OPERATION PERFORMED: 1. Incision and drainage of right index finger flexor sheath. 2. Incision and drainage of palmar abscesses times two. 3. Incision and drainage of dorsal abscess times one, right index finger.  SURGEON:  Christy Fitch. Lamb, Montez Hageman., M.D.  ASSISTANT:  None.  ANESTHESIA:  0.25% Marcaine and 2% lidocaine wrist block supplemented by IV sedation.  SUPERVISING ANESTHESIOLOGIST:  Dr. Katrinka Lamb.  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  On January 11, 2002 she sustained multiple dog bites by her own jack russell terrier who is quite elderly and has impaired vision.  She noted within a few hours development of rubor and signs of probable infection.  She saw [name], M.D. at [facility] on January 12, 2002 and was started on doxycycline 100 mg p.o. b.i.d. due to a penicillin allergy.  She did care for her wounds with irrigation and Epsom  salt soaks.  She is very experienced with bite injuries with her animal work and has had good luck in the past with this strategy.  Unfortunately despite the doxycycline, she began to experience increased pain, swelling, rubor of her finger and development of painful flexed posture of the right index finger.  She began to develop erythema and swelling around several bite marks consistent with abscess formation.  She returned to see Dr. Gerri Lamb at [facility] on January 13, 2002, was noted to have signs of flexor tenosynovitis and abscess formation and a hand surgery consult was requested.  After informed consent she was admitted to [facility] for IV antibiotic therapy and is brought to the operating room for appropriate incision and drainage of her flexor tendon sheath and abscesses.  DESCRIPTION OF PROCEDURE:  [name] was brought to the operating room and placed in supine position on the operating table.  Following placement of a wrist block by Dr. Katrinka Lamb, anesthesia was incomplete in the right index finger. Therefore, 2% lidocaine without epinephrine was infiltrated into the region of the median nerve at wrist level and along the dorsal, radial and ulnar aspects of the wrist.  After a few moments, anesthesia was satisfactory in the region of the palm and index finger.  The obvious sites of abscess formation were incised and spread with a blunt fine hemostat.  Purulent material was immediately recovered and cultured for aerobic and anaerobic growth.  Flexor sheath was exposed at the level of  the A-1 pulley, the interval between the A-4 and A-5 pulleys and at the level of the abscess.  A #5 pediatric feeding tube was placed and prior to irrigation, more purulent material was expressed from the flexor sheath and cultured for aerobic and anaerobic growth.  The flexor sheath was then irrigated with triple antibiotic solution for a total of  approximately 100 cc until the effluent was clear of all purulent material.  The dorsal abscesses were then opened and spread with a hemostat, once again with purulent material being recovered.  The ring finger wound had cellulitis but did not appear to have an abscess forming.  The #5 pediatric feeding tube was sewn into the proximal wound and will be connected to an IV saline drip at 15 cc per hour.  The wounds were dressed with Xeroflo, sterile gauze and a voluminous Kerlix and ABD dressing with Ace wrap.  The patient will be admitted to the [facility] for IV antibiotic therapy in the form of IV vancomycin by the vancomycin protocol and p.o. doxycycline 100 mg p.o. b.i.d.  She will remain on her usual home medications.  We anticipate 48 hours of IV antibiotic therapy and discharge on an oral regimen. Dictated by:   [name]., M.D. Attending:  Katy Fitch. Naaman Lamb., M.D. DD:  01/13/02 TD:  01/15/02 Job: [id number]

## 2011-03-30 ENCOUNTER — Encounter: Payer: Self-pay | Admitting: Cardiology

## 2011-03-31 ENCOUNTER — Encounter: Payer: Self-pay | Admitting: Cardiology

## 2011-03-31 ENCOUNTER — Ambulatory Visit (INDEPENDENT_AMBULATORY_CARE_PROVIDER_SITE_OTHER): Payer: Self-pay | Admitting: Cardiology

## 2011-03-31 DIAGNOSIS — I10 Essential (primary) hypertension: Secondary | ICD-10-CM

## 2011-03-31 DIAGNOSIS — R079 Chest pain, unspecified: Secondary | ICD-10-CM

## 2011-03-31 DIAGNOSIS — E785 Hyperlipidemia, unspecified: Secondary | ICD-10-CM

## 2011-03-31 DIAGNOSIS — F17200 Nicotine dependence, unspecified, uncomplicated: Secondary | ICD-10-CM

## 2011-03-31 DIAGNOSIS — R072 Precordial pain: Secondary | ICD-10-CM

## 2011-03-31 DIAGNOSIS — Z720 Tobacco use: Secondary | ICD-10-CM | POA: Insufficient documentation

## 2011-03-31 DIAGNOSIS — R9431 Abnormal electrocardiogram [ECG] [EKG]: Secondary | ICD-10-CM

## 2011-03-31 NOTE — Assessment & Plan Note (Signed)
Symptoms seem most consistent with gastrointestinal etiology. Multiple risk factors. Also with mildly abnormal electrocardiogram. Schedule stress Myoview for risk stratification.

## 2011-03-31 NOTE — Assessment & Plan Note (Signed)
Schedule Myoview for risk stratification. 

## 2011-03-31 NOTE — Patient Instructions (Signed)
Your physician has requested that you have en exercise stress myoview. For further information please visit [email or web address]. Please follow instruction sheet, as given.   

## 2011-03-31 NOTE — Assessment & Plan Note (Signed)
Discussed diet. She will most likely need a statin in the future but I will leave this to primary care.

## 2011-03-31 NOTE — Assessment & Plan Note (Signed)
Blood pressure elevated. I have asked her to follow this at home. If it remains elevated she will contact her primary care physician and her ACE inhibitor could be increased. Other antihypertensives as needed.

## 2011-03-31 NOTE — Progress Notes (Signed)
HPI: 60 yo female for evaluation of abnormal ECG. H. Apparently she was told by a surgeon in the late 90s that she had a previous infarct. This was based on an abnormal electrocardiogram. She had a stress test that apparently was normal but I do not have those records available. She was recently noted to have an abnormal electrocardiogram and we were asked to further evaluate. She does have dyspnea with more extreme activities that resolves with rest. There is no orthopnea, PND. Occasional mild pedal edema with traveling. She has chest discomfort which has been attributed to indigestion in the past. This has been present intermittently for years. It is a sharp pain that is associated with water brash. There is nausea. There is no shortness of breath or diaphoresis. It can last all day at times. She does not have exertional chest pain. Because of the above we are asked to further evaluate.  Current Outpatient Prescriptions  Medication Sig Dispense Refill  . amitriptyline (ELAVIL) 25 MG tablet Take 100 mg by mouth at bedtime.        . Aspirin-Salicylamide-Caffeine (BC HEADACHE) 325-95-16 MG TABS Take by mouth as needed.       Marland Kitchen esomeprazole (NEXIUM) 40 MG capsule Take 40 mg by mouth daily before breakfast.        . lisinopril-hydrochlorothiazide (PRINZIDE,ZESTORETIC) 20-12.5 MG per tablet Take 1 tablet by mouth daily.          Allergies  Allergen Reactions  . Benadryl (Diphenhydramine Hcl)   . Codeine   . Morphine Sulfate   . Penicillins   . Prevacid   . Red Dye   . Shellfish Allergy   . Simvastatin   . Sulfa Antibiotics     Past Medical History  Diagnosis Date  . Hypertension   . Hyperlipidemia   . Migraines   . Cancer of sigmoid colon 07/2005    T2N0M0  . Gastritis   . Reflux   . Depression   . COPD (chronic obstructive pulmonary disease)   . Chronic bronchitis   . Alcohol abuse, in remission   . OA (osteoarthritis)   . Hypothyroid     Past Surgical History  Procedure Date    . Abdominal hysterectomy   . Colon resection   . Repair of incisional hernia   . Tonsillectomy   . Cholecystectomy   . Appendectomy   . Shoulder surgery   . Hand surgery     History   Social History  . Marital Status: Single    Spouse Name: N/A    Number of Children: N/A  . Years of Education: N/A   Occupational History  . Not on file.   Social History Main Topics  . Smoking status: Current Everyday Smoker -- 1.5 packs/day    Types: Cigarettes  . Smokeless tobacco: Not on file  . Alcohol Use: No     recovering  alcoholic  . Drug Use: No  . Sexually Active: Not on file   Other Topics Concern  . Not on file   Social History Narrative  . No narrative on file    Family History  Problem Relation Age of Onset  . Heart disease Mother     Previous MI at age 30-55  . Atrial fibrillation Mother   . Stroke Mother   . Diabetes Sister     ROS: Problems with indigestion and knee arthralgias but no fevers or chills, productive cough, hemoptysis, dysphasia, odynophagia, melena, hematochezia, dysuria, hematuria, rash, seizure activity, orthopnea, PND,  claudication. Remaining systems are negative.  Physical Exam: General:  Well developed/well nourished in NAD Skin warm/dry Patient not depressed No peripheral clubbing Back-normal HEENT-normal/normal eyelids Neck supple/normal carotid upstroke bilaterally; no bruits; no JVD; no thyromegaly chest - CTA/ normal expansion CV - RRR/normal S1 and S2; no murmurs, rubs or gallops;  PMI nondisplaced Abdomen -NT/ND, no HSM, no mass, + bowel sounds, no bruit 2+ femoral pulses, no bruits Ext-no edema, chords, 2+ DP Neuro-grossly nonfocal  ECG 03/26/11 NSR, Lateral TWI.

## 2011-03-31 NOTE — Assessment & Plan Note (Signed)
Patient counseled on discontinuing. 

## 2011-04-06 ENCOUNTER — Ambulatory Visit (HOSPITAL_COMMUNITY): Payer: Self-pay | Attending: Cardiology | Admitting: Radiology

## 2011-04-06 DIAGNOSIS — R9431 Abnormal electrocardiogram [ECG] [EKG]: Secondary | ICD-10-CM

## 2011-04-06 DIAGNOSIS — R0789 Other chest pain: Secondary | ICD-10-CM

## 2011-04-06 DIAGNOSIS — R0989 Other specified symptoms and signs involving the circulatory and respiratory systems: Secondary | ICD-10-CM

## 2011-04-06 DIAGNOSIS — R072 Precordial pain: Secondary | ICD-10-CM

## 2011-04-06 MED ORDER — TECHNETIUM TC 99M TETROFOSMIN IV KIT
33.0000 | PACK | Freq: Once | INTRAVENOUS | Status: AC | PRN
  Administered 2011-04-06: 33 via INTRAVENOUS

## 2011-04-06 MED ORDER — TECHNETIUM TC 99M TETROFOSMIN IV KIT
11.0000 | PACK | Freq: Once | INTRAVENOUS | Status: AC | PRN
  Administered 2011-04-06: 11 via INTRAVENOUS

## 2011-04-06 NOTE — Progress Notes (Signed)
[facility] SITE 3 NUCLEAR MED [address] [place] [address] [phone]  Cardiology Nuclear Med Study  [name] is a 60 y.o. female [id number] 09/11/1950   Nuclear Med Background Indication for Stress Test:  Evaluation for Ischemia and Abnormal EKG History:  COPD and '97 MPS:OK per patient. Cardiac Risk Factors: Family History - CAD, Hypertension, Lipids, Overweight and Smoker  Symptoms:  Chest Pain (last episode of chest discomfort was this a.m., 3/10; none now), DOE and Nausea   Nuclear Pre-Procedure Caffeine/Decaff Intake:  None NPO After: 8:00pm   Lungs:  Coarse, but no wheezing.   IV 0.9% NS with Angio Cath:  20g  IV Site: R Forearm  IV Started by:  Stanton Kidney, EMT-P  Chest Size (in):  34 Cup Size: D  Height: 5' 5.5" (1.664 m)  Weight:  184 lb (83.462 kg)  BMI:  Body mass index is 30.15 kg/(m^2). Tech Comments:  NA    Nuclear Med Study 1 or 2 day study: 1 day  Stress Test Type:  Stress  Reading MD: Willa Rough, MD  Order Authorizing Provider:  Olga Millers, MD  Resting Radionuclide: Technetium 42m Tetrofosmin  Resting Radionuclide Dose: 11 mCi   Stress Radionuclide:  Technetium 68m Tetrofosmin  Stress Radionuclide Dose: 33 mCi           Stress Protocol Rest HR: 77 Stress HR: 141  Rest BP: 130/98 Stress BP: 185/99  Exercise Time (min): 6:30 METS: 7.0   Predicted Max HR: 160 bpm % Max HR: 88.12 bpm Rate Pressure Product: 21308   Dose of Adenosine (mg):  n/a Dose of Lexiscan: n/a mg  Dose of Atropine (mg): n/a Dose of Dobutamine: n/a mcg/kg/min (at max HR)  Stress Test Technologist: Smiley Houseman, CMA-N  Nuclear Technologist:  Domenic Polite, CNMT     Rest Procedure:  Myocardial perfusion imaging was performed at rest 45 minutes following the intravenous administration of Technetium 32m Tetrofosmin.  Rest ECG: Nonspecific T-wave changes.  Stress Procedure:  The patient exercised for 6:30 on the treadmill utilizing the  Bruce protocol.  The patient stopped due to fatigue.  She c/o chest tightness, 3-4/10, with exercise.  There were no diagnostic ST-T wave changes.  She had a mild hypertensive response to exercise, 179/109.  Technetium 43m Tetrofosmin was injected at peak exercise and myocardial perfusion imaging was performed after a brief delay.  Stress ECG: No significant change from baseline ECG  QPS Raw Data Images:  Patient motion noted; appropriate software correction applied. Stress Images:  Normal homogeneous uptake in all areas of the myocardium. Rest Images:  Normal homogeneous uptake in all areas of the myocardium. Subtraction (SDS):  No evidence of ischemia. Transient Ischemic Dilatation (Normal <1.22):  .94 Lung/Heart Ratio (Normal <0.45):  .27  Quantitative Gated Spect Images QGS EDV:  64 ml QGS ESV:  19 ml QGS cine images:  Normal Wall Motion QGS EF: 70%  Impression Exercise Capacity:  Fair exercise capacity. BP Response:  Hypertensive blood pressure response. Clinical Symptoms:  Chest tight 3/10 ECG Impression:  No significant ST segment change suggestive of ischemia. Comparison with Prior Nuclear Study: No images to compare  Overall Impression:  Normal stress nuclear study.  Willa Rough

## 2011-04-08 ENCOUNTER — Telehealth: Payer: Self-pay | Admitting: Cardiology

## 2011-04-08 ENCOUNTER — Encounter: Payer: Self-pay | Admitting: Cardiology

## 2011-04-08 NOTE — Telephone Encounter (Signed)
Pt has question re stress test results

## 2011-04-08 NOTE — Telephone Encounter (Signed)
Spoke with pt, aware of nuclear results [name]  

## 2011-04-08 NOTE — Telephone Encounter (Signed)
Pt calling wanting to know results of her stress test. Please return call to discuss further.

## 2011-04-09 NOTE — Telephone Encounter (Signed)
Patient calling back to speak with nurse.

## 2011-04-09 NOTE — Telephone Encounter (Signed)
Spoke with pt, questions answered [name]  

## 2011-06-15 ENCOUNTER — Ambulatory Visit: Payer: Self-pay | Admitting: Physician Assistant

## 2011-06-15 ENCOUNTER — Other Ambulatory Visit: Payer: Self-pay | Admitting: Lab

## 2013-01-19 ENCOUNTER — Telehealth: Payer: Self-pay | Admitting: Oncology

## 2013-01-19 ENCOUNTER — Encounter: Payer: Self-pay | Admitting: Oncology

## 2013-01-19 ENCOUNTER — Other Ambulatory Visit: Payer: Self-pay

## 2013-01-19 DIAGNOSIS — Z85038 Personal history of other malignant neoplasm of large intestine: Secondary | ICD-10-CM

## 2013-01-19 NOTE — Progress Notes (Signed)
We received a copy of labs sent to [place] by Dr. Mila Palmer,  CEA was 15.4 Albumin 4.5 CBC was normal with a hemoglobin of 14.9 We do not have an LDH.     This patient was last seen by [place] on 06/15/2010. We have been following her for a T2, N0, M0, stage I sigmoid colon cancer, status post colectomy in 2006. The patient did not show for an appointment that had been scheduled for 06/15/2011. Currently she has no scheduled followup with [place].  At the moment I do not have access to my previous notes, however this patient has had a persistently elevated CEA, most recently 9.3 on 06/15/2000. I believe she is a heavy smoker.  Most recent chest x-ray I see in the EMR is from 09/01/2010 that was normal.   We will go ahead and repeat the CEA and check an LDH. Patient may need at least a chest x-ray possibly a CT scan of the chest if in fact her CEA has increased. She may also need a colonoscopy if she hasn't had one recently.

## 2013-01-22 ENCOUNTER — Other Ambulatory Visit: Payer: Self-pay

## 2013-01-22 ENCOUNTER — Other Ambulatory Visit (HOSPITAL_BASED_OUTPATIENT_CLINIC_OR_DEPARTMENT_OTHER): Payer: BC Managed Care – PPO

## 2013-01-22 DIAGNOSIS — Z85038 Personal history of other malignant neoplasm of large intestine: Secondary | ICD-10-CM

## 2013-01-22 DIAGNOSIS — C187 Malignant neoplasm of sigmoid colon: Secondary | ICD-10-CM

## 2013-01-22 LAB — CEA: CEA: 13 ng/mL — ABNORMAL HIGH (ref 0.0–5.0)

## 2013-01-23 ENCOUNTER — Telehealth: Payer: Self-pay | Admitting: Medical Oncology

## 2013-01-23 ENCOUNTER — Telehealth: Payer: Self-pay | Admitting: Oncology

## 2013-01-23 ENCOUNTER — Other Ambulatory Visit: Payer: Self-pay | Admitting: Medical Oncology

## 2013-01-23 DIAGNOSIS — Z85038 Personal history of other malignant neoplasm of large intestine: Secondary | ICD-10-CM

## 2013-01-23 NOTE — Telephone Encounter (Signed)
I called pt to inform her that her CEA is 13.0. It was 15.4 when drawn at Dr. Paulino Rily office. I asked when she had her last colonoscopy and she states it was 2011. I explained that Dr. Arline Asp is concerned with the elevation of the CEA. He is going to order a CT of the chest,abdomen and pelvis. She is aware the scheduler will call her with an appointment.

## 2013-01-24 ENCOUNTER — Telehealth: Payer: Self-pay

## 2013-01-24 NOTE — Telephone Encounter (Signed)
Pt called asking if lab from Dr Paulino Rily would suffice for her CT on Friday. The labs were obtained, no BMET was included so informed pt to come in for lab at [facility] before her CT.

## 2013-01-26 ENCOUNTER — Ambulatory Visit (HOSPITAL_COMMUNITY)
Admission: RE | Admit: 2013-01-26 | Discharge: 2013-01-26 | Disposition: A | Discharge status: Home / Self Care | Payer: BC Managed Care – PPO | Source: Ambulatory Visit | Attending: Oncology | Admitting: Oncology

## 2013-01-26 ENCOUNTER — Encounter (HOSPITAL_COMMUNITY): Payer: Self-pay

## 2013-01-26 ENCOUNTER — Other Ambulatory Visit (HOSPITAL_BASED_OUTPATIENT_CLINIC_OR_DEPARTMENT_OTHER): Payer: BC Managed Care – PPO | Admitting: Lab

## 2013-01-26 DIAGNOSIS — Z9089 Acquired absence of other organs: Secondary | ICD-10-CM | POA: Insufficient documentation

## 2013-01-26 DIAGNOSIS — Z90710 Acquired absence of both cervix and uterus: Secondary | ICD-10-CM | POA: Insufficient documentation

## 2013-01-26 DIAGNOSIS — I700 Atherosclerosis of aorta: Secondary | ICD-10-CM | POA: Insufficient documentation

## 2013-01-26 DIAGNOSIS — K432 Incisional hernia without obstruction or gangrene: Secondary | ICD-10-CM | POA: Insufficient documentation

## 2013-01-26 DIAGNOSIS — Z85038 Personal history of other malignant neoplasm of large intestine: Secondary | ICD-10-CM

## 2013-01-26 DIAGNOSIS — J438 Other emphysema: Secondary | ICD-10-CM | POA: Insufficient documentation

## 2013-01-26 DIAGNOSIS — E041 Nontoxic single thyroid nodule: Secondary | ICD-10-CM | POA: Insufficient documentation

## 2013-01-26 DIAGNOSIS — R970 Elevated carcinoembryonic antigen [CEA]: Secondary | ICD-10-CM | POA: Insufficient documentation

## 2013-01-26 LAB — BASIC METABOLIC PANEL (CC13)
BUN: 12.4 mg/dL (ref 7.0–26.0)
Chloride: 102 mEq/L (ref 98–109)
Creatinine: 0.9 mg/dL (ref 0.6–1.1)

## 2013-01-26 MED ORDER — IOHEXOL 300 MG/ML  SOLN
100.0000 mL | Freq: Once | INTRAMUSCULAR | Status: AC | PRN
  Administered 2013-01-26: 100 mL via INTRAVENOUS

## 2013-01-26 NOTE — Progress Notes (Signed)
Quick Note:  Please notify patient and call/fax these results to patient's doctors. ______ 

## 2013-01-29 ENCOUNTER — Encounter: Payer: Self-pay | Admitting: Oncology

## 2013-01-29 ENCOUNTER — Telehealth: Payer: Self-pay | Admitting: Medical Oncology

## 2013-01-29 NOTE — Telephone Encounter (Signed)
I called pt to inform her that her  CT's were negative for cancer. Dr. Arline Asp is still concerned about the elevated CEA. He would like for [place] to refer her back to Dr. Ewing Schlein. Marland Kitchen Her last colonoscopy was in 2011. I explained the importance of following up on this. She voiced understanding.

## 2013-01-29 NOTE — Progress Notes (Signed)
This patient has been undergoing a workup for an elevated CEA, 13.0 on 01/22/2013.   CT scans of chest abdomen and pelvis carried out on 01/26/2013 were negative for cancer.   The patient had a colonoscopy by Dr. Vida Rigger approximately 3 years ago. She has an appointment to see him in late July for reevaluation and consideration of repeat colonoscopy in view of her elevated CEA which had been running in the 9 range previously.

## 2013-01-29 NOTE — Telephone Encounter (Signed)
I called Dr. Marlane Hatcher office to get pt a follow up appointment for her elevated CEA. I faxed labs, CT's and office note. I called with appointment of July 30 th at 900 am.

## 2013-03-14 ENCOUNTER — Other Ambulatory Visit: Payer: Self-pay | Admitting: Gastroenterology

## 2013-04-29 ENCOUNTER — Emergency Department (HOSPITAL_COMMUNITY)
Admission: EM | Admit: 2013-04-29 | Discharge: 2013-04-29 | Disposition: A | Discharge status: Home / Self Care | Payer: BC Managed Care – PPO | Attending: Emergency Medicine | Admitting: Emergency Medicine

## 2013-04-29 ENCOUNTER — Encounter (HOSPITAL_COMMUNITY): Payer: Self-pay | Admitting: *Deleted

## 2013-04-29 DIAGNOSIS — Z8639 Personal history of other endocrine, nutritional and metabolic disease: Secondary | ICD-10-CM | POA: Insufficient documentation

## 2013-04-29 DIAGNOSIS — F3289 Other specified depressive episodes: Secondary | ICD-10-CM | POA: Insufficient documentation

## 2013-04-29 DIAGNOSIS — W540XXA Bitten by dog, initial encounter: Secondary | ICD-10-CM | POA: Insufficient documentation

## 2013-04-29 DIAGNOSIS — Z79899 Other long term (current) drug therapy: Secondary | ICD-10-CM | POA: Insufficient documentation

## 2013-04-29 DIAGNOSIS — Y9389 Activity, other specified: Secondary | ICD-10-CM | POA: Insufficient documentation

## 2013-04-29 DIAGNOSIS — Z9889 Other specified postprocedural states: Secondary | ICD-10-CM | POA: Insufficient documentation

## 2013-04-29 DIAGNOSIS — J449 Chronic obstructive pulmonary disease, unspecified: Secondary | ICD-10-CM | POA: Insufficient documentation

## 2013-04-29 DIAGNOSIS — Z85038 Personal history of other malignant neoplasm of large intestine: Secondary | ICD-10-CM | POA: Insufficient documentation

## 2013-04-29 DIAGNOSIS — M1990 Unspecified osteoarthritis, unspecified site: Secondary | ICD-10-CM | POA: Insufficient documentation

## 2013-04-29 DIAGNOSIS — K219 Gastro-esophageal reflux disease without esophagitis: Secondary | ICD-10-CM | POA: Insufficient documentation

## 2013-04-29 DIAGNOSIS — I10 Essential (primary) hypertension: Secondary | ICD-10-CM | POA: Insufficient documentation

## 2013-04-29 DIAGNOSIS — S61451A Open bite of right hand, initial encounter: Secondary | ICD-10-CM

## 2013-04-29 DIAGNOSIS — S61452A Open bite of left hand, initial encounter: Secondary | ICD-10-CM

## 2013-04-29 DIAGNOSIS — Y92009 Unspecified place in unspecified non-institutional (private) residence as the place of occurrence of the external cause: Secondary | ICD-10-CM | POA: Insufficient documentation

## 2013-04-29 DIAGNOSIS — G43909 Migraine, unspecified, not intractable, without status migrainosus: Secondary | ICD-10-CM | POA: Insufficient documentation

## 2013-04-29 DIAGNOSIS — F17200 Nicotine dependence, unspecified, uncomplicated: Secondary | ICD-10-CM | POA: Insufficient documentation

## 2013-04-29 DIAGNOSIS — S61409A Unspecified open wound of unspecified hand, initial encounter: Secondary | ICD-10-CM | POA: Insufficient documentation

## 2013-04-29 DIAGNOSIS — J4489 Other specified chronic obstructive pulmonary disease: Secondary | ICD-10-CM | POA: Insufficient documentation

## 2013-04-29 DIAGNOSIS — Z862 Personal history of diseases of the blood and blood-forming organs and certain disorders involving the immune mechanism: Secondary | ICD-10-CM | POA: Insufficient documentation

## 2013-04-29 DIAGNOSIS — Z880 Allergy status to penicillin: Secondary | ICD-10-CM | POA: Insufficient documentation

## 2013-04-29 DIAGNOSIS — F329 Major depressive disorder, single episode, unspecified: Secondary | ICD-10-CM | POA: Insufficient documentation

## 2013-04-29 MED ORDER — CLINDAMYCIN HCL 300 MG PO CAPS: 300.0000 mg | ORAL_CAPSULE | Freq: Four times a day (QID) | ORAL | Status: DC

## 2013-04-29 MED ORDER — DOXYCYCLINE HYCLATE 100 MG PO CAPS: 100.0000 mg | ORAL_CAPSULE | Freq: Two times a day (BID) | ORAL | Status: DC

## 2013-04-29 NOTE — ED Notes (Signed)
Pt states that she was transporting an injured dog from her neighborhood and when she went to move it the dog bit her rt hand ans scratched bilateral arms; pt reports that the animal has had its vaccinations

## 2013-04-29 NOTE — ED Provider Notes (Signed)
CSN: [id number]     Arrival date & time 04/29/13  2101 History  This chart was scribed for non-physician practitioner Ivonne Andrew, working with Nelia Shi, MD by Carl Best, ED Scribe. This patient was seen in room WTR9/WTR9 and the patient's care was started at 11:02 PM.    Chief Complaint  Patient presents with  . Animal Bite    Patient is a 62 y.o. female presenting with animal bite. The history is provided by the patient. No language interpreter was used.  Animal Bite  HPI Comments: Allicia D Basista is a 62 y.o. female who presents to the Emergency Department complaining of dog bite to her right hand that occurred this evening.  She states that she tried to transport the dog to the animal clinic when it bit her right and scratched her both arms bilaterally.  The dog is current on his immunizations and was injured from an attack by another larger Rottweiler dog. She states she takes blood pressure medication.  The patient denies being on any anticoagulants.  She states she is a "boarderline" diabetic.  She states she is using the electronic cigarettes.  She states that her TD is UTD.  No other aggravating or alleviating factors. No other associated symptoms.   Past Medical History  Diagnosis Date  . Hypertension   . Hyperlipidemia   . Migraines   . Cancer of sigmoid colon 07/2005    T2N0M0  . Gastritis   . Reflux   . Depression   . COPD (chronic obstructive pulmonary disease)   . Chronic bronchitis   . Alcohol abuse, in remission   . OA (osteoarthritis)   . Hypothyroid    Past Surgical History  Procedure Laterality Date  . Abdominal hysterectomy    . Colon resection    . Repair of incisional hernia    . Tonsillectomy    . Cholecystectomy    . Appendectomy    . Shoulder surgery    . Hand surgery     Family History  Problem Relation Age of Onset  . Heart disease Mother     Previous MI at age 25-55  . Atrial fibrillation Mother   . Stroke Mother   . Diabetes  Sister    History  Substance Use Topics  . Smoking status: Current Every Day Smoker -- 1.50 packs/day    Types: Cigarettes  . Smokeless tobacco: Not on file  . Alcohol Use: No     Comment: recovering  alcoholic   OB History   Grav Para Term Preterm Abortions TAB SAB Ect Mult Living                 Review of Systems  Skin: Positive for wound (3rd and 4th right MCP joint).    Allergies  Benadryl; Codeine; Lansoprazole; Morphine sulfate; Penicillins; Red dye; Shellfish allergy; Simvastatin; and Sulfa antibiotics  Home Medications   Current Outpatient Rx  Name  Route  Sig  Dispense  Refill  . amitriptyline (ELAVIL) 25 MG tablet   Oral   Take 100 mg by mouth at bedtime.           . Aspirin-Salicylamide-Caffeine (BC HEADACHE) 325-95-16 MG TABS   Oral   Take by mouth as needed.          Marland Kitchen esomeprazole (NEXIUM) 40 MG capsule   Oral   Take 40 mg by mouth daily before breakfast.           . lisinopril-hydrochlorothiazide (PRINZIDE,ZESTORETIC) 20-12.5 MG  per tablet   Oral   Take 1 tablet by mouth daily.            Triage Vitals: BP 147/90  Pulse 85  Temp(Src) 98.2 F (36.8 C) (Oral)  Resp 20  SpO2 100%  Physical Exam  Nursing note and vitals reviewed. Constitutional: She is oriented to person, place, and time. She appears well-developed and well-nourished. No distress.  HENT:  Head: Normocephalic and atraumatic.  Right Ear: External ear normal.  Left Ear: External ear normal.  Nose: Nose normal.  Eyes: Conjunctivae are normal.  Neck: Neck supple.  Cardiovascular:  Good capillary refill to her right hand.  Pulmonary/Chest: Effort normal.  Musculoskeletal:  No decreased weakness in the right hand. Normal sensation to all the fingers with normal capillary refill.  Neurological: She is alert and oriented to person, place, and time.  Skin: Skin is warm and dry. She is not diaphoretic.  A few bites along the left hand, and bite marks on the 3rd and 4th MCP  joints on the right hand. No deep structure or tendon involvement through full range of motion  Psychiatric: She has a normal mood and affect.    ED Course  Procedures   DIAGNOSTIC STUDIES: Oxygen Saturation is 100% on room air, normal by my interpretation.    COORDINATION OF CARE: 11:05 PM- Discussed closing the wound to her right hand with butterfly bandages and loose tape with the patient.  Discussed discharging the patient with an antibiotics and advised the patient to follow up with the orthopedic hand specialist.  The patient agreed to the treatment plan.      MDM   1. Dog bite of hand, right, initial encounter   2. Dog bite of hand without complication, left, initial encounter    Patient seen and evaluated. Patient well appearing. The dog was current on shots. Patient is up-to-date on tetanus. I discussed recommendations for x-rays however patient has refused. She has good range of motion there is no deep structure or tendon involvement through full range of motion. Nurse has performed wound care and cleaning. No closure.  I personally performed the services described in this documentation, which was scribed in my presence. The recorded information has been reviewed and is accurate.    Angus Seller, PA-C 04/30/13 [phone]

## 2013-05-11 NOTE — ED Provider Notes (Signed)
Medical screening examination/treatment/procedure(s) were performed by non-physician practitioner and as supervising physician I was immediately available for consultation/collaboration.    Guliana Weyandt L Eriana Suliman, MD 05/11/13 0748 

## 2014-09-02 ENCOUNTER — Ambulatory Visit (HOSPITAL_BASED_OUTPATIENT_CLINIC_OR_DEPARTMENT_OTHER): Payer: 59 | Admitting: Hematology

## 2014-09-02 ENCOUNTER — Encounter: Payer: Self-pay | Admitting: Hematology

## 2014-09-02 ENCOUNTER — Encounter (INDEPENDENT_AMBULATORY_CARE_PROVIDER_SITE_OTHER): Payer: Self-pay

## 2014-09-02 ENCOUNTER — Ambulatory Visit: Payer: 59

## 2014-09-02 VITALS — BP 166/91 | HR 77 | Temp 97.6°F | Resp 18 | Ht 65.5 in | Wt 151.8 lb

## 2014-09-02 DIAGNOSIS — Z720 Tobacco use: Secondary | ICD-10-CM

## 2014-09-02 DIAGNOSIS — Z85038 Personal history of other malignant neoplasm of large intestine: Secondary | ICD-10-CM

## 2014-09-02 NOTE — Progress Notes (Signed)
[place]  [phone] [phone]) [phone]  Clinic New Consult Note   Patient Care Team: Christy Coma, MD as PCP - General (Family Medicine) Christy Merle, MD as Consulting Physician (Hematology) 09/02/2014  CHIEF COMPLAINTS/PURPOSE OF CONSULTATION:  Follow up colon cancer and elevated CEA.  DIAGNOSIS: pT2N0M0 stage I colon cancer, diagnosed on 08/12/2005   HISTORY OF PRESENTING ILLNESS:  [name] 64 y.o. female is here because of elevated CEA.  She was diagnosed with stage I sigmoid colon cancer in January 2007, status post sigmoid colon resection with negative margins. She did not require a adjuvant chemotherapy, and was subsequently followed by Dr. Ralene Lamb until 1-2 years ago, when Dr. Ralene Lamb retired.   She has been doing well overall, no new complaints. She did not have a preoperative CEA level checked in 2007, and her CEA level has been always slightly elevated in the range of 8-14 since August 2007. Her primary care physician Dr. Stephanie Lamb referred her back for follow-up.  She has chronic back pain and arthritis joint pain. No other complains. She has good appetite and eats well, no weight loss.  MEDICAL HISTORY:  Past Medical History  Diagnosis Date  . Hypertension   . Hyperlipidemia   . Migraines   . Cancer of sigmoid colon 07/2005    T2N0M0  . Gastritis   . Reflux   . Depression   . COPD (chronic obstructive pulmonary disease)   . Chronic bronchitis   . Alcohol abuse, in remission   . OA (osteoarthritis)   . Hypothyroid     SURGICAL HISTORY: Past Surgical History  Procedure Laterality Date  . Abdominal hysterectomy    . Colon resection    . Repair of incisional hernia    . Tonsillectomy    . Cholecystectomy    . Appendectomy    . Shoulder surgery    . Hand surgery      SOCIAL HISTORY: History   Social History  . Marital Status: Single    Spouse Name: N/A    Number of Children: N/A  . Years of Education: N/A    Occupational History  . Not on file.   Social History Main Topics  . Smoking status: Current Every Day Smoker -- 1.50 packs/day    Types: Cigarettes  . Smokeless tobacco: Not on file  . Alcohol Use: No     Comment: recovering  alcoholic  . Drug Use: No  . Sexual Activity: Not on file   Other Topics Concern  . Not on file   Social History Narrative    FAMILY HISTORY: Family History  Problem Relation Age of Onset  . Heart disease Mother     Previous MI at age 69-55  . Atrial fibrillation Mother   . Stroke Mother   . Diabetes Sister     ALLERGIES:  is allergic to benadryl; codeine; lansoprazole; morphine sulfate; penicillins; red dye; shellfish allergy; simvastatin; and sulfa antibiotics.  MEDICATIONS:  Current Outpatient Prescriptions  Medication Sig Dispense Refill  . amitriptyline (ELAVIL) 25 MG tablet Take 100 mg by mouth at bedtime.      . Aspirin-Salicylamide-Caffeine (BC HEADACHE) 325-95-16 MG TABS Take by mouth as needed.     . clindamycin (CLEOCIN) 300 MG capsule Take 1 capsule (300 mg total) by mouth 4 (four) times daily. X 7 days 28 capsule 0  .    0  . esomeprazole (NEXIUM) 40 MG capsule Take 40 mg by mouth daily before breakfast.      .  lisinopril-hydrochlorothiazide (PRINZIDE,ZESTORETIC) 20-12.5 MG per tablet Take 1 tablet by mouth daily.       No current facility-administered medications for this visit.    REVIEW OF SYSTEMS:   Constitutional: Denies fevers, chills or abnormal night sweats Eyes: Denies blurriness of vision, double vision or watery eyes Ears, nose, mouth, throat, and face: Denies mucositis or sore throat Respiratory: Denies cough, dyspnea or wheezes Cardiovascular: Denies palpitation, chest discomfort or lower extremity swelling Gastrointestinal:  Denies nausea, heartburn or change in bowel habits Skin: Denies abnormal skin rashes Lymphatics: Denies new lymphadenopathy or easy bruising Neurological:Denies numbness, tingling or new  weaknesses Behavioral/Psych: Mood is stable, no new changes  All other systems were reviewed with the patient and are negative.  PHYSICAL EXAMINATION: ECOG PERFORMANCE STATUS: 0 - Asymptomatic  Filed Vitals:   09/02/14 1117  BP: 166/91  Pulse: 77  Temp: 97.6 F (36.4 C)  Resp: 18   Filed Weights   09/02/14 1108 09/02/14 1117  Weight: 151 lb 12.8 oz (68.856 kg) 151 lb 12.8 oz (68.856 kg)    GENERAL:alert, no distress and comfortable SKIN: skin color, texture, turgor are normal, no rashes or significant lesions EYES: normal, conjunctiva are pink and non-injected, sclera clear OROPHARYNX:no exudate, no erythema and lips, buccal mucosa, and tongue normal  NECK: supple, thyroid normal size, non-tender, without nodularity LYMPH:  no palpable lymphadenopathy in the cervical, axillary or inguinal LUNGS: clear to auscultation and percussion with normal breathing effort HEART: regular rate & rhythm and no murmurs and no lower extremity edema ABDOMEN:abdomen soft, non-tender and normal bowel sounds Musculoskeletal:no cyanosis of digits and no clubbing  PSYCH: alert & oriented x 3 with fluent speech NEURO: no focal motor/sensory deficits  LABORATORY DATA:  I have reviewed the data as listed Lab Results  Component Value Date   WBC 8.8 09/01/2010   HGB 15.1* 09/01/2010   HCT 44.7 09/01/2010   MCV 96.1 09/01/2010   PLT 257 09/01/2010   CMP Latest Ref Rng 01/26/2013 09/01/2010 06/15/2010  Glucose 70 - 140 mg/dl 133 98 117(H)  BUN 7.0 - 26.0 mg/dL 12.4 8 11   Creatinine 0.6 - 1.1 mg/dL 0.9 0.81 1.01  Sodium 136 - 145 mEq/L 141 140 140  Potassium 3.5 - 5.1 mEq/L 4.0 4.3 3.4(L)  Chloride 96 - 112 mEq/L - 99 100  CO2 22 - 29 mEq/L 29 31 28   Calcium 8.4 - 10.4 mg/dL 9.2 9.6 9.3  Total Protein 6.0 - 8.3 g/dL - - 7.1  Total Bilirubin 0.3 - 1.2 mg/dL - - 0.4  Alkaline Phos 39 - 117 U/L - - 74  AST 0 - 37 U/L - - 37  ALT 0 - 35 U/L - - 19   Results for [name], [name] (MRN [id number])  as of 09/02/2014 08:10  Ref. Range 03/21/2006 13:54 01/06/2009 14:29 05/13/2009 14:27 07/14/2009 15:57 11/13/2009 14:30 06/15/2010 15:54 01/22/2013 12:21  CEA Latest Range: 0.0-5.0 ng/mL 10.5 (H) 7.7 (H) 8.4 (H) 8.2 (H) 9.0 (H) 9.3 (H) 13.0 (H)   PATHOLOGY REPORT 1. Surgical [P], rectal colon polyp, excision 03/04/2013 - HYPERPLASTIC POLYP, 1 FRAGMENT. - BENIGN COLORECTAL MUCOSA, 1 FRAGMENT. - NO DYSPLASIA OR MALIGNANCY IDENTIFIED. 2. Surgical [P], esophagus, bx - BENIGN SQUAMOUS MUCOSA WITH MILD REACTIVE CHANGES, SEE COMMENT. - LESS THAN 5 EOSINOPHILS PER HIGH POWER FIELD. - NO DYSPLASIA OR MALIGNANCY IDENTIFIED.  MRN: [id number] Pathologist: Lyndon Code M.D., Vonna Kotyk DOB/Age November 30, 1950 (Age: 59) Gender: F Date Taken: 10/27/2009 Date Received: 10/27/2009  FINAL DIAGNOSIS   1. COLON, POLYP(S),  CECAL : - TUBULAR ADENOMA. - high grade dysplasia is not identified.  SIGMOID COLON, SEGMENTAL RESECTION: 08/12/2005  - INVASIVE MODERATELY DIFFERENTIATED COLONIC ADENOCARCINOMA. - TUMOR IS FOCALLY INVASIVE INTO MUSCULARIS PROPRIA. - MARGINS OF RESECTION AND ELEVEN PERICOLONIC LYMPH NODES FREE OF TUMOR.  COMMENT ONCOLOGY TABLE- COLON AND RECTUM  1. Maximum tumor size (cm): 2.5 cm 2. Histology: Adenocarcinoma 3. Grade: G2 4. Margins: proximal- free ; distal- free ; radial- free Distance of invasive carcinoma from nearest margin: 3.5 cm, distal margin 5. Perforation of visceral peritoneum: No 6. Depth of invasion: Tumor is focally invasive into muscularis propria 7. Vascular/lymphatic invasion: Not identified 8. Lymph nodes: # examined 11 ; # positive 0 9. TNM code: pT2, pN0, pMX 10. Comments: The tumor is invasive into the submucosa for the most part, however, focally there is superficial invasion of the muscularis propria making this a T2 lesion. (EA:jy) 08/16/05  jy Date Reported: 08/16/2005 Jaquelyn Bitter A. Rodney Cruise, MD   Clinical information (ms)  specimen(s)  obtained Colon, segmental resection for tumor, sigmoid  Gross Description Specimen: Sigmoid Length: 15 cm Serosa: Pink to hyperemic with focal adhesions. There is a small amount of soft mesentery with focal adhesions. Tumor location: Serosal wall Tumor size (cm): 2.5 x 2.5 x 0.5 cm tan-red, firm sessile mass % Circumference involved by tumor: 40% Depth of invasion: None grossly Margins: 3.5 and 9 cm from margins ; radial- 4 cm Other mucosal lesions: None Lymph nodes: Found are seventeen possible soft, fatty lymph nodes ranging from 0.1 to 0.4 cm in greatest dimension. Block summary: A = margin nearest mass B = margin furthest from mass C - F = mass, entirely submitted G = four nodes, whole H = four nodes, whole I = four nodes, whole J = five nodes, whole Total: 10 blocks (SW:jy) 08/13/05   RADIOGRAPHIC STUDIES: I have personally reviewed the radiological images as listed and agreed with the findings in the report.  CT chest w contrast 01/06/2013 1. No evidence of thoracic metastatic disease. 2. No acute chest findings demonstrated. 3. Mild emphysema. 4. Small left thyroid nodule. It is difficult confirm the presence of this nodule on the prior chest CT. Thyroid ultrasound correlation should be considered.  CT abdomen and pelvis 01/06/2013 1. No evidence of abdominal pelvic metastatic disease. No explanation for elevated CEA levels is identified; continued follow- up of the CEA levels is recommended. 2. Small residual/recurrent ventral hernia into which a knuckle of the transverse colon protrudes. No evidence of incarceration or obstruction. 3. No acute findings.  ASSESSMENT & PLAN:  64 year old female, with history of stage I sigmoid colon cancer in 2007, status post surgical resection.  1. Colon cancer, pT2N0M0, stage I, 08/2005 -She is clinically doing very well, her physical exam today was negative. -last colonoscopy was negative in  2014, she also had CT of chest abdomen and pelvis in June 2014 for elevated CEA which showed no evidence of recurrence. -I think her elevated CEA is not related to her history of colon cancer, she is 9 years out of her initial surgery, currently has no evidence of disease recurrence. -Continue yearly surveillance.  2. Elevated CEA  -Her CEA level has been elevated in the similar range since 2007 (7 months after her initial surgery), and has not changed much in the past 8-9 years. I don't think this is a marker of cancer recurrence. -CEA elevation can also be seen in other type of solid tumors, but giving her low and stable level over  8-9 years, negative CT chest scan last year, I do not think this is related to malignancy. -CEA elevation can also be seen inr nonmalignant situation, such as chronic smoker, liver disease, colitis, hypothyroidism. She does have significant smoking history, and is still actively smoking. We discussed smoking cessation, and she is willing to try.  3. Smoking  -I discussed smoking cessation with her, she is willing to quit.  Follow-up: Return in 1 year with lab.   All questions were answered. The patient knows to call the clinic with any problems, questions or concerns. I spent 30 minutes counseling the patient face to face. The total time spent in the appointment was 40 minutes and more than 50% was on counseling.     Christy Merle, MD 09/02/2014 11:49 AM

## 2014-09-02 NOTE — Progress Notes (Signed)
Checked in new pt with no financial concerns prior to seeing the dr. Informed pt if chemo is part of his treatment Raquel will obtain auth from her insurance company as well as contact foundations that offer copay assistance for chemo if needed. Pt has Raquel's card for any billing questions or concerns.

## 2014-09-03 ENCOUNTER — Telehealth: Payer: Self-pay | Admitting: Hematology

## 2014-09-03 NOTE — Telephone Encounter (Signed)
Left message to confirm appointments for January/February 2017. Mailed calendar

## 2015-08-18 DIAGNOSIS — Z23 Encounter for immunization: Secondary | ICD-10-CM | POA: Diagnosis not present

## 2015-08-18 DIAGNOSIS — G43909 Migraine, unspecified, not intractable, without status migrainosus: Secondary | ICD-10-CM | POA: Diagnosis not present

## 2015-08-18 DIAGNOSIS — R030 Elevated blood-pressure reading, without diagnosis of hypertension: Secondary | ICD-10-CM | POA: Diagnosis not present

## 2015-09-01 ENCOUNTER — Other Ambulatory Visit (HOSPITAL_BASED_OUTPATIENT_CLINIC_OR_DEPARTMENT_OTHER): Payer: Self-pay

## 2015-09-01 DIAGNOSIS — Z85038 Personal history of other malignant neoplasm of large intestine: Secondary | ICD-10-CM | POA: Diagnosis not present

## 2015-09-01 LAB — COMPREHENSIVE METABOLIC PANEL WITH GFR
ALT: <9 U/L (ref 0–55)
AST: 13 U/L (ref 5–34)
Albumin: 3.8 g/dL (ref 3.5–5.0)
Alkaline Phosphatase: 90 U/L (ref 40–150)
Anion Gap: 10 meq/L (ref 3–11)
BUN: 14.8 mg/dL (ref 7.0–26.0)
CO2: 27 meq/L (ref 22–29)
Calcium: 9.5 mg/dL (ref 8.4–10.4)
Chloride: 104 meq/L (ref 98–109)
Creatinine: 0.9 mg/dL (ref 0.6–1.1)
EGFR: 71 ml/min/1.73 m2 — ABNORMAL LOW
Glucose: 85 mg/dL (ref 70–140)
Potassium: 4 meq/L (ref 3.5–5.1)
Sodium: 141 meq/L (ref 136–145)
Total Bilirubin: <0.3 mg/dL (ref 0.20–1.20)
Total Protein: 7.5 g/dL (ref 6.4–8.3)

## 2015-09-01 LAB — CBC WITH DIFFERENTIAL/PLATELET
BASO%: 0.7 % (ref 0.0–2.0)
Basophils Absolute: 0.1 10e3/uL (ref 0.0–0.1)
EOS%: 4.1 % (ref 0.0–7.0)
Eosinophils Absolute: 0.3 10e3/uL (ref 0.0–0.5)
HCT: 45.1 % (ref 34.8–46.6)
HGB: 15.1 g/dL (ref 11.6–15.9)
LYMPH%: 36.5 % (ref 14.0–49.7)
MCH: 32.3 pg (ref 25.1–34.0)
MCHC: 33.5 g/dL (ref 31.5–36.0)
MCV: 96.3 fL (ref 79.5–101.0)
MONO#: 0.5 10e3/uL (ref 0.1–0.9)
MONO%: 5.9 % (ref 0.0–14.0)
NEUT#: 4.3 10e3/uL (ref 1.5–6.5)
NEUT%: 52.8 % (ref 38.4–76.8)
Platelets: 242 10e3/uL (ref 145–400)
RBC: 4.68 10e6/uL (ref 3.70–5.45)
RDW: 12.5 % (ref 11.2–14.5)
WBC: 8.2 10e3/uL (ref 3.9–10.3)
lymph#: 3 10e3/uL (ref 0.9–3.3)

## 2015-09-02 LAB — CEA: CEA1: 15.5 ng/mL — AB (ref 0.0–4.7)

## 2015-09-08 ENCOUNTER — Ambulatory Visit (HOSPITAL_BASED_OUTPATIENT_CLINIC_OR_DEPARTMENT_OTHER): Payer: Medicare Other | Admitting: Hematology

## 2015-09-08 ENCOUNTER — Encounter: Payer: Self-pay | Admitting: Hematology

## 2015-09-08 VITALS — BP 168/91 | HR 80 | Temp 98.5°F | Resp 18 | Ht 65.5 in | Wt 162.8 lb

## 2015-09-08 DIAGNOSIS — Z85038 Personal history of other malignant neoplasm of large intestine: Secondary | ICD-10-CM | POA: Diagnosis not present

## 2015-09-08 DIAGNOSIS — K469 Unspecified abdominal hernia without obstruction or gangrene: Secondary | ICD-10-CM

## 2015-09-08 DIAGNOSIS — R970 Elevated carcinoembryonic antigen [CEA]: Secondary | ICD-10-CM | POA: Diagnosis not present

## 2015-09-08 DIAGNOSIS — Z720 Tobacco use: Secondary | ICD-10-CM

## 2015-09-08 NOTE — Progress Notes (Signed)
[place]  [phone] [phone]  Clinic follow Up Note   Patient Care Team: Jonathon Jordan, MD as PCP - General (Family Medicine) Truitt Merle, MD as Consulting Physician (Hematology) Clarene Essex, MD as Consulting Physician (Gastroenterology) 09/08/2015  CHIEF COMPLAINTS:  Follow up colon cancer and elevated CEA.  DIAGNOSIS: pT2N0M0 stage I colon cancer, diagnosed on 08/12/2005   CURRENT THERAPY:  Observation  HISTORY OF PRESENTING ILLNESS:  [name] 65 y.o. female is here because of elevated CEA.  She was diagnosed with stage I sigmoid colon cancer in January 2007, status post sigmoid colon resection with negative margins. She did not require a adjuvant chemotherapy, and was subsequently followed by Dr. Ralene Ok until 1-2 years ago, when Dr. Ralene Ok retired.   She has been doing well overall, she has an abdominal hernia and it causes some abdominal discomfort with exertion, she also has chronic back pain which rediates to right hip, no leg pain, or weakkness. No other new complains, she has good appetite and  Her weight has been stable.  She has been busy taking care of her mother, who had stroke lately and lives in nursing home.  She still smokes 1 pack a day,  If you tried electronic cigarettes. She used to smoke much more before.   MEDICAL HISTORY:  Past Medical History  Diagnosis Date  . Hypertension   . Hyperlipidemia   . Migraines   . Cancer of sigmoid colon ([place]) 07/2005    T2N0M0  . Gastritis   . Reflux   . Depression   . COPD (chronic obstructive pulmonary disease) ([place])   . Chronic bronchitis   . Alcohol abuse, in remission   . OA (osteoarthritis)   . Hypothyroid     SURGICAL HISTORY: Past Surgical History  Procedure Laterality Date  . Abdominal hysterectomy    . Colon resection    . Repair of incisional hernia    . Tonsillectomy    . Cholecystectomy    . Appendectomy    . Shoulder surgery    . Hand surgery        SOCIAL HISTORY: Social History   Social History  . Marital Status: Single    Spouse Name: N/A  . Number of Children: N/A  . Years of Education: N/A   Occupational History  . Not on file.   Social History Main Topics  . Smoking status: Current Every Day Smoker -- 1.50 packs/day    Types: Cigarettes  . Smokeless tobacco: Not on file  . Alcohol Use: No     Comment: recovering  alcoholic  . Drug Use: No  . Sexual Activity: Not on file   Other Topics Concern  . Not on file   Social History Narrative    FAMILY HISTORY: Family History  Problem Relation Age of Onset  . Heart disease Mother     Previous MI at age 59-55  . Atrial fibrillation Mother   . Stroke Mother   . Diabetes Sister   . Cancer Father 67    Prostate cancer   . Cancer Maternal Aunt     lung cancer   . Cancer Maternal Uncle     kidney ca    ALLERGIES:  is allergic to benadryl; codeine; lansoprazole; morphine sulfate; penicillins; red dye; shellfish allergy; simvastatin; and sulfa antibiotics.  MEDICATIONS:    Medication List       This list is accurate as of: 09/08/15 10:42 AM.  Always use your most recent  med list.               amitriptyline 25 MG tablet  Commonly known as:  ELAVIL  Take 100 mg by mouth at bedtime.     VITAMIN B 12 PO  Take 5,000 mcg by mouth daily.         REVIEW OF SYSTEMS:   Constitutional: Denies fevers, chills or abnormal night sweats Eyes: Denies blurriness of vision, double vision or watery eyes Ears, nose, mouth, throat, and face: Denies mucositis or sore throat Respiratory: Denies cough, dyspnea or wheezes Cardiovascular: Denies palpitation, chest discomfort or lower extremity swelling Gastrointestinal:  Denies nausea, heartburn or change in bowel habits Skin: Denies abnormal skin rashes Lymphatics: Denies new lymphadenopathy or easy bruising Neurological:Denies numbness, tingling or new weaknesses Behavioral/Psych: Mood is stable, no new changes   All other systems were reviewed with the patient and are negative.  PHYSICAL EXAMINATION: ECOG PERFORMANCE STATUS: 0 - Asymptomatic  Filed Vitals:   09/08/15 1017  BP: 168/91  Pulse: 80  Temp: 98.5 F (36.9 C)  Resp: 18   Filed Weights   09/08/15 1017  Weight: 162 lb 12.8 oz (73.846 kg)    GENERAL:alert, no distress and comfortable SKIN: skin color, texture, turgor are normal, no rashes or significant lesions EYES: normal, conjunctiva are pink and non-injected, sclera clear OROPHARYNX:no exudate, no erythema and lips, buccal mucosa, and tongue normal  NECK: supple, thyroid normal size, non-tender, without nodularity LYMPH:  no palpable lymphadenopathy in the cervical, axillary or inguinal LUNGS: clear to auscultation and percussion with normal breathing effort HEART: regular rate & rhythm and no murmurs and no lower extremity edema ABDOMEN:abdomen soft, non-tender and normal bowel sounds, (+) small  Abdominal hernia at the midline incision when she coughs.  Musculoskeletal:no cyanosis of digits and no clubbing  PSYCH: alert & oriented x 3 with fluent speech NEURO: no focal motor/sensory deficits  She declined breast exam  LABORATORY DATA:  I have reviewed the data as listed CBC Latest Ref Rng 09/01/2015 09/01/2010 06/15/2010  WBC 3.9 - 10.3 10e3/uL 8.2 8.8 12.4(H)  Hemoglobin 11.6 - 15.9 g/dL 15.1 15.1(H) 14.1  Hematocrit 34.8 - 46.6 % 45.1 44.7 40.2  Platelets 145 - 400 10e3/uL 242 257 265    CMP Latest Ref Rng 09/01/2015 01/26/2013 09/01/2010  Glucose 70 - 140 mg/dl 85 133 98  BUN 7.0 - 26.0 mg/dL 14.8 12.4 8  Creatinine 0.6 - 1.1 mg/dL 0.9 0.9 0.81  Sodium 136 - 145 mEq/L 141 141 140  Potassium 3.5 - 5.1 mEq/L 4.0 4.0 4.3  Chloride 96 - 112 mEq/L - - 99  CO2 22 - 29 mEq/L 27 29 31   Calcium 8.4 - 10.4 mg/dL 9.5 9.2 9.6  Total Protein 6.4 - 8.3 g/dL 7.5 - -  Total Bilirubin 0.20 - 1.20 mg/dL <0.30 - -  Alkaline Phos 40 - 150 U/L 90 - -  AST 5 - 34 U/L 13 - -  ALT 0  - 55 U/L <9 - -   Results for [name], [name] (MRN [id number]) as of 09/08/2015 06:35  Ref. Range 05/13/2009 14:27 07/14/2009 15:57 11/13/2009 14:30 06/15/2010 15:54 01/22/2013 12:21  CEA Latest Ref Range: 0.0-5.0 ng/mL 8.4 (H) 8.2 (H) 9.0 (H) 9.3 (H) 13.0 (H)   CEA  Status: Finalresult Visible to patient:  Not Released Nextappt: Today at 10:30 AM in [facility], Krista Blue, MD) Dx:  H/O colon cancer, stage I            Ref  Range 7d ago    CEA 0.0 - 4.7 ng/mL 15.5 (H)         PATHOLOGY REPORT 1. Surgical [P], rectal colon polyp, excision 03/04/2013 - HYPERPLASTIC POLYP, 1 FRAGMENT. - BENIGN COLORECTAL MUCOSA, 1 FRAGMENT. - NO DYSPLASIA OR MALIGNANCY IDENTIFIED. 2. Surgical [P], esophagus, bx - BENIGN SQUAMOUS MUCOSA WITH MILD REACTIVE CHANGES, SEE COMMENT. - LESS THAN 5 EOSINOPHILS PER HIGH POWER FIELD. - NO DYSPLASIA OR MALIGNANCY IDENTIFIED.  MRN: [id number] Pathologist: Lyndon Code M.D., Vonna Kotyk DOB/Age May 29, 1951 (Age: 20) Gender: F Date Taken: 10/27/2009 Date Received: 10/27/2009  FINAL DIAGNOSIS   1. COLON, POLYP(S), CECAL : - TUBULAR ADENOMA. - high grade dysplasia is not identified.  SIGMOID COLON, SEGMENTAL RESECTION: 08/12/2005  - INVASIVE MODERATELY DIFFERENTIATED COLONIC ADENOCARCINOMA. - TUMOR IS FOCALLY INVASIVE INTO MUSCULARIS PROPRIA. - MARGINS OF RESECTION AND ELEVEN PERICOLONIC LYMPH NODES FREE OF TUMOR.  COMMENT ONCOLOGY TABLE- COLON AND RECTUM  1. Maximum tumor size (cm): 2.5 cm 2. Histology: Adenocarcinoma 3. Grade: G2 4. Margins: proximal- free ; distal- free ; radial- free Distance of invasive carcinoma from nearest margin: 3.5 cm, distal margin 5. Perforation of visceral peritoneum: No 6. Depth of invasion: Tumor is focally invasive into muscularis propria 7. Vascular/lymphatic invasion: Not identified 8. Lymph nodes: # examined 11 ; # positive 0 9. TNM code: pT2, pN0, pMX 10. Comments: The tumor is  invasive into the submucosa for the most part, however, focally there is superficial invasion of the muscularis propria making this a T2 lesion. (EA:jy) 08/16/05  jy Date Reported: 08/16/2005 Jaquelyn Bitter A. Rodney Cruise, MD   Clinical information (ms)  specimen(s) obtained Colon, segmental resection for tumor, sigmoid  Gross Description Specimen: Sigmoid Length: 15 cm Serosa: Pink to hyperemic with focal adhesions. There is a small amount of soft mesentery with focal adhesions. Tumor location: Serosal wall Tumor size (cm): 2.5 x 2.5 x 0.5 cm tan-red, firm sessile mass % Circumference involved by tumor: 40% Depth of invasion: None grossly Margins: 3.5 and 9 cm from margins ; radial- 4 cm Other mucosal lesions: None Lymph nodes: Found are seventeen possible soft, fatty lymph nodes ranging from 0.1 to 0.4 cm in greatest dimension. Block summary: A = margin nearest mass B = margin furthest from mass C - F = mass, entirely submitted G = four nodes, whole H = four nodes, whole I = four nodes, whole J = five nodes, whole Total: 10 blocks (SW:jy) 08/13/05   RADIOGRAPHIC STUDIES: I have personally reviewed the radiological images as listed and agreed with the findings in the report.  CT chest w contrast 01/06/2013 1. No evidence of thoracic metastatic disease. 2. No acute chest findings demonstrated. 3. Mild emphysema. 4. Small left thyroid nodule. It is difficult confirm the presence of this nodule on the prior chest CT. Thyroid ultrasound correlation should be considered.  CT abdomen and pelvis 01/06/2013 1. No evidence of abdominal pelvic metastatic disease. No explanation for elevated CEA levels is identified; continued follow- up of the CEA levels is recommended. 2. Small residual/recurrent ventral hernia into which a knuckle of the transverse colon protrudes. No evidence of incarceration or obstruction. 3. No acute findings.  ASSESSMENT  & PLAN:  65 year old female, with history of stage I sigmoid colon cancer in 2007, status post surgical resection.  1. Colon cancer, pT2N0M0, stage I, 08/2005 -She is clinically doing very well, her physical exam today was negative. -last colonoscopy was negative in 2014, she also had CT of chest abdomen and pelvis in June 2014  for elevated CEA which showed no evidence of recurrence. -I think her elevated CEA is not related to her history of colon cancer, she is 10 years out of her initial surgery, currently has no evidence of disease recurrence. -Continue yearly surveillance.  She prefers to be followed at our cancer center.  2. Elevated CEA  -Her CEA level has been elevated in the similar range since 2007 (7 months after her initial surgery), and has not changed much in the past 8-9 years. I don't think this is a marker of cancer recurrence. -CEA elevation can also be seen in other type of solid tumors, but giving her low and stable level over 8-9 years, negative CT chest scan last year, I do not think this is related to malignancy. -CEA elevation can also be seen inr nonmalignant situation, such as chronic smoker, liver disease, colitis, hypothyroidism. She does have significant smoking history, and is still actively smoking. We discussed smoking cessation, and she is willing to try.  3. Smoking  -I discussed smoking cessation with her again, she is willing to try,  But has difficulties to quit.  4. Abdominal hernia  - she will follow-up with her surgeon.  5.  Cancer screening - I strongly encouraged her to have annual mammogram,  She is overdue. She will discuss with her primary care physician on her any checkup this months.  Follow-up: Return in 1 year with lab.   All questions were answered. The patient knows to call the clinic with any problems, questions or concerns. I spent 20 minutes counseling the patient face to face. The total time spent in the appointment was 25 minutes and  more than 50% was on counseling.     Truitt Merle, MD 09/08/2015 10:42 AM

## 2015-09-09 ENCOUNTER — Telehealth: Payer: Self-pay | Admitting: Hematology

## 2015-09-09 NOTE — Telephone Encounter (Signed)
per pof to sch pt appt-mailed pt copy of avs °

## 2015-09-17 DIAGNOSIS — E1165 Type 2 diabetes mellitus with hyperglycemia: Secondary | ICD-10-CM | POA: Diagnosis not present

## 2015-09-17 DIAGNOSIS — I10 Essential (primary) hypertension: Secondary | ICD-10-CM | POA: Diagnosis not present

## 2015-09-17 DIAGNOSIS — N182 Chronic kidney disease, stage 2 (mild): Secondary | ICD-10-CM | POA: Diagnosis not present

## 2015-09-17 DIAGNOSIS — E039 Hypothyroidism, unspecified: Secondary | ICD-10-CM | POA: Diagnosis not present

## 2015-09-17 DIAGNOSIS — Z85038 Personal history of other malignant neoplasm of large intestine: Secondary | ICD-10-CM | POA: Diagnosis not present

## 2015-09-17 DIAGNOSIS — D7589 Other specified diseases of blood and blood-forming organs: Secondary | ICD-10-CM | POA: Diagnosis not present

## 2015-09-17 DIAGNOSIS — Z79899 Other long term (current) drug therapy: Secondary | ICD-10-CM | POA: Diagnosis not present

## 2015-09-17 DIAGNOSIS — E1121 Type 2 diabetes mellitus with diabetic nephropathy: Secondary | ICD-10-CM | POA: Diagnosis not present

## 2015-09-17 DIAGNOSIS — Z0000 Encounter for general adult medical examination without abnormal findings: Secondary | ICD-10-CM | POA: Diagnosis not present

## 2015-09-17 DIAGNOSIS — E559 Vitamin D deficiency, unspecified: Secondary | ICD-10-CM | POA: Diagnosis not present

## 2015-09-17 DIAGNOSIS — E782 Mixed hyperlipidemia: Secondary | ICD-10-CM | POA: Diagnosis not present

## 2015-09-17 DIAGNOSIS — Z720 Tobacco use: Secondary | ICD-10-CM | POA: Diagnosis not present

## 2015-09-18 DIAGNOSIS — E1121 Type 2 diabetes mellitus with diabetic nephropathy: Secondary | ICD-10-CM | POA: Diagnosis not present

## 2015-09-18 DIAGNOSIS — Z0000 Encounter for general adult medical examination without abnormal findings: Secondary | ICD-10-CM | POA: Diagnosis not present

## 2015-10-28 DIAGNOSIS — Z780 Asymptomatic menopausal state: Secondary | ICD-10-CM | POA: Diagnosis not present

## 2015-12-18 DIAGNOSIS — N63 Unspecified lump in breast: Secondary | ICD-10-CM | POA: Diagnosis not present

## 2015-12-22 DIAGNOSIS — G43909 Migraine, unspecified, not intractable, without status migrainosus: Secondary | ICD-10-CM | POA: Diagnosis not present

## 2015-12-22 DIAGNOSIS — Z79899 Other long term (current) drug therapy: Secondary | ICD-10-CM | POA: Diagnosis not present

## 2015-12-22 DIAGNOSIS — F325 Major depressive disorder, single episode, in full remission: Secondary | ICD-10-CM | POA: Diagnosis not present

## 2015-12-22 DIAGNOSIS — E782 Mixed hyperlipidemia: Secondary | ICD-10-CM | POA: Diagnosis not present

## 2015-12-22 DIAGNOSIS — I10 Essential (primary) hypertension: Secondary | ICD-10-CM | POA: Diagnosis not present

## 2015-12-22 DIAGNOSIS — E039 Hypothyroidism, unspecified: Secondary | ICD-10-CM | POA: Diagnosis not present

## 2015-12-22 DIAGNOSIS — E1121 Type 2 diabetes mellitus with diabetic nephropathy: Secondary | ICD-10-CM | POA: Diagnosis not present

## 2015-12-22 DIAGNOSIS — E559 Vitamin D deficiency, unspecified: Secondary | ICD-10-CM | POA: Diagnosis not present

## 2016-03-29 DIAGNOSIS — M1711 Unilateral primary osteoarthritis, right knee: Secondary | ICD-10-CM | POA: Diagnosis not present

## 2016-04-27 DIAGNOSIS — M5416 Radiculopathy, lumbar region: Secondary | ICD-10-CM | POA: Diagnosis not present

## 2016-04-27 DIAGNOSIS — M545 Low back pain: Secondary | ICD-10-CM | POA: Diagnosis not present

## 2016-05-03 DIAGNOSIS — M5416 Radiculopathy, lumbar region: Secondary | ICD-10-CM | POA: Diagnosis not present

## 2016-05-03 DIAGNOSIS — M545 Low back pain: Secondary | ICD-10-CM | POA: Diagnosis not present

## 2016-05-25 DIAGNOSIS — M545 Low back pain: Secondary | ICD-10-CM | POA: Diagnosis not present

## 2016-05-25 DIAGNOSIS — M25552 Pain in left hip: Secondary | ICD-10-CM | POA: Diagnosis not present

## 2016-05-25 DIAGNOSIS — M25551 Pain in right hip: Secondary | ICD-10-CM | POA: Diagnosis not present

## 2016-05-25 DIAGNOSIS — M5416 Radiculopathy, lumbar region: Secondary | ICD-10-CM | POA: Diagnosis not present

## 2016-05-31 DIAGNOSIS — M545 Low back pain: Secondary | ICD-10-CM | POA: Diagnosis not present

## 2016-06-04 DIAGNOSIS — M545 Low back pain: Secondary | ICD-10-CM | POA: Diagnosis not present

## 2016-06-04 DIAGNOSIS — M5416 Radiculopathy, lumbar region: Secondary | ICD-10-CM | POA: Diagnosis not present

## 2016-06-29 DIAGNOSIS — M5416 Radiculopathy, lumbar region: Secondary | ICD-10-CM | POA: Diagnosis not present

## 2016-06-29 DIAGNOSIS — M545 Low back pain: Secondary | ICD-10-CM | POA: Diagnosis not present

## 2016-08-10 DIAGNOSIS — M5416 Radiculopathy, lumbar region: Secondary | ICD-10-CM | POA: Diagnosis not present

## 2016-08-10 DIAGNOSIS — M545 Low back pain: Secondary | ICD-10-CM | POA: Diagnosis not present

## 2016-08-20 DIAGNOSIS — Z23 Encounter for immunization: Secondary | ICD-10-CM | POA: Diagnosis not present

## 2016-08-23 DIAGNOSIS — M48062 Spinal stenosis, lumbar region with neurogenic claudication: Secondary | ICD-10-CM | POA: Diagnosis not present

## 2016-08-30 ENCOUNTER — Telehealth: Payer: Self-pay | Admitting: Hematology

## 2016-08-30 NOTE — Telephone Encounter (Signed)
left message to advise patient that appointment has changed for same day 09/08/16 from 12pm to 10:30am

## 2016-09-01 ENCOUNTER — Other Ambulatory Visit: Payer: Medicare Other

## 2016-09-08 ENCOUNTER — Ambulatory Visit: Payer: Medicare Other | Admitting: Hematology

## 2016-09-10 ENCOUNTER — Ambulatory Visit (HOSPITAL_BASED_OUTPATIENT_CLINIC_OR_DEPARTMENT_OTHER): Payer: Medicare Other

## 2016-09-10 ENCOUNTER — Ambulatory Visit (HOSPITAL_BASED_OUTPATIENT_CLINIC_OR_DEPARTMENT_OTHER): Payer: Medicare Other | Admitting: Hematology

## 2016-09-10 ENCOUNTER — Telehealth: Payer: Self-pay | Admitting: Hematology

## 2016-09-10 VITALS — BP 151/78 | HR 84 | Temp 98.1°F | Resp 18 | Ht 65.5 in | Wt 165.7 lb

## 2016-09-10 DIAGNOSIS — Z720 Tobacco use: Secondary | ICD-10-CM

## 2016-09-10 DIAGNOSIS — R970 Elevated carcinoembryonic antigen [CEA]: Secondary | ICD-10-CM | POA: Diagnosis not present

## 2016-09-10 DIAGNOSIS — K469 Unspecified abdominal hernia without obstruction or gangrene: Secondary | ICD-10-CM

## 2016-09-10 DIAGNOSIS — Z85038 Personal history of other malignant neoplasm of large intestine: Secondary | ICD-10-CM

## 2016-09-10 LAB — CBC WITH DIFFERENTIAL/PLATELET
BASO%: 0.3 % (ref 0.0–2.0)
Basophils Absolute: 0 10*3/uL (ref 0.0–0.1)
EOS ABS: 0.3 10*3/uL (ref 0.0–0.5)
EOS%: 2.6 % (ref 0.0–7.0)
HEMATOCRIT: 42.3 % (ref 34.8–46.6)
HGB: 14.3 g/dL (ref 11.6–15.9)
LYMPH#: 3.4 10*3/uL — AB (ref 0.9–3.3)
LYMPH%: 30.7 % (ref 14.0–49.7)
MCH: 32.2 pg (ref 25.1–34.0)
MCHC: 33.8 g/dL (ref 31.5–36.0)
MCV: 95.3 fL (ref 79.5–101.0)
MONO#: 0.7 10*3/uL (ref 0.1–0.9)
MONO%: 6.3 % (ref 0.0–14.0)
NEUT%: 60.1 % (ref 38.4–76.8)
NEUTROS ABS: 6.6 10*3/uL — AB (ref 1.5–6.5)
PLATELETS: 258 10*3/uL (ref 145–400)
RBC: 4.44 10*6/uL (ref 3.70–5.45)
RDW: 12.7 % (ref 11.2–14.5)
WBC: 10.9 10*3/uL — AB (ref 3.9–10.3)

## 2016-09-10 LAB — COMPREHENSIVE METABOLIC PANEL
ALBUMIN: 4.1 g/dL (ref 3.5–5.0)
ALK PHOS: 88 U/L (ref 40–150)
ALT: 8 U/L (ref 0–55)
AST: 14 U/L (ref 5–34)
Anion Gap: 10 mEq/L (ref 3–11)
BILIRUBIN TOTAL: 0.42 mg/dL (ref 0.20–1.20)
BUN: 20.2 mg/dL (ref 7.0–26.0)
CALCIUM: 10.3 mg/dL (ref 8.4–10.4)
CO2: 29 mEq/L (ref 22–29)
CREATININE: 0.9 mg/dL (ref 0.6–1.1)
Chloride: 100 mEq/L (ref 98–109)
EGFR: 70 mL/min/{1.73_m2} — AB (ref >90)
Glucose: 94 mg/dl (ref 70–140)
Potassium: 3.7 mEq/L (ref 3.5–5.1)
Sodium: 139 mEq/L (ref 136–145)
TOTAL PROTEIN: 7.6 g/dL (ref 6.4–8.3)

## 2016-09-10 NOTE — Progress Notes (Signed)
[place]  [phone] [phone]  Clinic follow Up Note   Patient Care Team: Jonathon Jordan, MD as PCP - General (Family Medicine) Truitt Merle, MD as Consulting Physician (Hematology) Clarene Essex, MD as Consulting Physician (Gastroenterology) 09/10/2016  CHIEF COMPLAINTS:  Follow up colon cancer and elevated CEA.  DIAGNOSIS: pT2N0M0 stage I colon cancer, diagnosed on 08/12/2005   CURRENT THERAPY:  Observation  HISTORY OF PRESENTING ILLNESS:  [name] 66 y.o. female is here because of elevated CEA.  She was diagnosed with stage I sigmoid colon cancer in January 2007, status post sigmoid colon resection with negative margins. She did not require a adjuvant chemotherapy, and was subsequently followed by Dr. Ralene Ok until 1-2 years ago, when Dr. Ralene Ok retired.  INTERIM HISTORY: [name] presents for a routine follow up. She reports chronic joint pain in her back and knees. She states her orthopedic want to remove a disk. She reports some constipation and strains to pass it. She reports a dark stool. Denies blood in stool or taking an iron supplement. She still smokes and is using an electronic cigarette. She reports smoking less now. The patient reports having a mammogram at [facility] last year. The patient states she has intestinally lost some weight.  MEDICAL HISTORY:  Past Medical History:  Diagnosis Date  . Alcohol abuse, in remission   . Cancer of sigmoid colon ([place]) 07/2005   T2N0M0  . Chronic bronchitis   . COPD (chronic obstructive pulmonary disease) ([place])   . Depression   . Gastritis   . Hyperlipidemia   . Hypertension   . Hypothyroid   . Migraines   . OA (osteoarthritis)   . Reflux     SURGICAL HISTORY: Past Surgical History:  Procedure Laterality Date  . ABDOMINAL HYSTERECTOMY    . APPENDECTOMY    . CHOLECYSTECTOMY    . COLON RESECTION    . HAND SURGERY    . repair of incisional hernia    . SHOULDER SURGERY      . TONSILLECTOMY      SOCIAL HISTORY: Social History   Social History  . Marital status: Single    Spouse name: N/A  . Number of children: N/A  . Years of education: N/A   Occupational History  . Not on file.   Social History Main Topics  . Smoking status: Current Every Day Smoker    Packs/day: 1.50    Types: Cigarettes  . Smokeless tobacco: Not on file  . Alcohol use No     Comment: recovering  alcoholic  . Drug use: No  . Sexual activity: Not on file   Other Topics Concern  . Not on file   Social History Narrative  . No narrative on file    FAMILY HISTORY: Family History  Problem Relation Age of Onset  . Heart disease Mother     Previous MI at age 54-55  . Atrial fibrillation Mother   . Stroke Mother   . Diabetes Sister   . Cancer Father 60    Prostate cancer   . Cancer Maternal Aunt     lung cancer   . Cancer Maternal Uncle     kidney ca    ALLERGIES:  is allergic to benadryl [diphenhydramine hcl]; codeine; lansoprazole; morphine sulfate; penicillins; red dye; shellfish allergy; simvastatin; and sulfa antibiotics.  MEDICATIONS:  Allergies as of 09/10/2016      Reactions   Benadryl [diphenhydramine Hcl]    Codeine  Lansoprazole    Morphine Sulfate    Penicillins    Red Dye    Also allergic to [employer] Allergy    Simvastatin    Sulfa Antibiotics       Medication List       Accurate as of 09/10/16 11:59 PM. Always use your most recent med list.          amitriptyline 25 MG tablet Commonly known as:  ELAVIL Take 100 mg by mouth at bedtime.   amLODipine 5 MG tablet Commonly known as:  NORVASC Take 5 mg by mouth at bedtime.   gabapentin 300 MG capsule Commonly known as:  NEURONTIN Take 300 mg by mouth 3 (three) times daily.   ranitidine 150 MG tablet Commonly known as:  ZANTAC Take 150 mg by mouth at bedtime.   VITAMIN B 12 PO Take 5,000 mcg by mouth daily.   Vitamin D 2000 units Caps Take 2,000 Units by mouth at  bedtime.   vitamin E 100 UNIT capsule Take 100 Units by mouth at bedtime.        REVIEW OF SYSTEMS:   Constitutional: Denies fevers, chills or abnormal night sweats Eyes: Denies blurriness of vision, double vision or watery eyes Ears, nose, mouth, throat, and face: Denies mucositis or sore throat Respiratory: Denies cough, dyspnea or wheezes Cardiovascular: Denies palpitation, chest discomfort or lower extremity swelling Gastrointestinal:  Denies nausea, heartburn (+) Constipation Skin: Denies abnormal skin rashes Lymphatics: Denies new lymphadenopathy or easy bruising Neurological:Denies numbness, tingling or new weaknesses Behavioral/Psych: Mood is stable, no new changes  All other systems were reviewed with the patient and are negative.  PHYSICAL EXAMINATION: ECOG PERFORMANCE STATUS: 0 - Asymptomatic  Vitals:   09/10/16 1350 09/10/16 1405  BP: (!) 144/103 (!) 151/78  Pulse: 87 84  Resp: 18   Temp: 98.1 F (36.7 C)    Filed Weights   09/10/16 1350  Weight: 165 lb 11.2 oz (75.2 kg)    GENERAL:alert, no distress and comfortable SKIN: skin color, texture, turgor are normal, no rashes or significant lesions EYES: normal, conjunctiva are pink and non-injected, sclera clear OROPHARYNX:no exudate, no erythema and lips, buccal mucosa, and tongue normal  NECK: supple, thyroid normal size, non-tender, without nodularity LYMPH:  no palpable lymphadenopathy in the cervical, axillary or inguinal LUNGS: clear to auscultation and percussion with normal breathing effort HEART: regular rate & rhythm and no murmurs and no lower extremity edema ABDOMEN:abdomen soft, non-tender and normal bowel sounds, (+) small abdominal hernia at the midline incision when she coughs. Musculoskeletal:no cyanosis of digits and no clubbing  PSYCH: alert & oriented x 3 with fluent speech NEURO: no focal motor/sensory deficits  She declined breast exam  LABORATORY DATA:  I have reviewed the data as  listed CBC Latest Ref Rng & Units 09/10/2016 09/01/2015 09/01/2010  WBC 3.9 - 10.3 10e3/uL 10.9(H) 8.2 8.8  Hemoglobin 11.6 - 15.9 g/dL 14.3 15.1 15.1(H)  Hematocrit 34.8 - 46.6 % 42.3 45.1 44.7  Platelets 145 - 400 10e3/uL 258 242 257    CMP Latest Ref Rng & Units 09/10/2016 09/01/2015 01/26/2013  Glucose 70 - 140 mg/dl 94 85 133  BUN 7.0 - 26.0 mg/dL 20.2 14.8 12.4  Creatinine 0.6 - 1.1 mg/dL 0.9 0.9 0.9  Sodium 136 - 145 mEq/L 139 141 141  Potassium 3.5 - 5.1 mEq/L 3.7 4.0 4.0  Chloride 96 - 112 mEq/L - - -  CO2 22 - 29 mEq/L 29 27 29  Calcium 8.4 - 10.4 mg/dL 10.3 9.5 9.2  Total Protein 6.4 - 8.3 g/dL 7.6 7.5 -  Total Bilirubin 0.20 - 1.20 mg/dL 0.42 <0.30 -  Alkaline Phos 40 - 150 U/L 88 90 -  AST 5 - 34 U/L 14 13 -  ALT 0 - 55 U/L 8 <9 -   Results for TRENADY, DEBENEDETTO (MRN [id number]) as of 09/10/2016 13:58  Ref. Range 07/14/2009 15:57 11/13/2009 14:30 06/15/2010 15:54 01/22/2013 12:21 09/01/2015 10:23  CEA Latest Ref Range: 0.0 - 5.0 ng/mL 8.2 (H) 9.0 (H) 9.3 (H) 13.0 (H)   CEA Latest Ref Range: 0.0 - 4.7 ng/mL     15.5 (H)   CEA today is pending  PATHOLOGY REPORT 1. Surgical [P], rectal colon polyp, excision 03/04/2013 - HYPERPLASTIC POLYP, 1 FRAGMENT. - BENIGN COLORECTAL MUCOSA, 1 FRAGMENT. - NO DYSPLASIA OR MALIGNANCY IDENTIFIED. 2. Surgical [P], esophagus, bx - BENIGN SQUAMOUS MUCOSA WITH MILD REACTIVE CHANGES, SEE COMMENT. - LESS THAN 5 EOSINOPHILS PER HIGH POWER FIELD. - NO DYSPLASIA OR MALIGNANCY IDENTIFIED.  MRN: [id number] Pathologist: Lyndon Code M.D., Vonna Kotyk DOB/Age February 03, 1951 (Age: 55) Gender: F Date Taken: 10/27/2009 Date Received: 10/27/2009  FINAL DIAGNOSIS   1. COLON, POLYP(S), CECAL : - TUBULAR ADENOMA. - high grade dysplasia is not identified.  SIGMOID COLON, SEGMENTAL RESECTION: 08/12/2005  - INVASIVE MODERATELY DIFFERENTIATED COLONIC ADENOCARCINOMA. - TUMOR IS FOCALLY INVASIVE INTO MUSCULARIS PROPRIA. - MARGINS OF RESECTION AND ELEVEN PERICOLONIC LYMPH  NODES FREE OF TUMOR.  COMMENT ONCOLOGY TABLE- COLON AND RECTUM  1. Maximum tumor size (cm): 2.5 cm 2. Histology: Adenocarcinoma 3. Grade: G2 4. Margins: proximal- free ; distal- free ; radial- free Distance of invasive carcinoma from nearest margin: 3.5 cm, distal margin 5. Perforation of visceral peritoneum: No 6. Depth of invasion: Tumor is focally invasive into muscularis propria 7. Vascular/lymphatic invasion: Not identified 8. Lymph nodes: # examined 11 ; # positive 0 9. TNM code: pT2, pN0, pMX 10. Comments: The tumor is invasive into the submucosa for the most part, however, focally there is superficial invasion of the muscularis propria making this a T2 lesion. (EA:jy) 08/16/05  jy Date Reported: 08/16/2005 Jaquelyn Bitter A. Rodney Cruise, MD   Clinical information (ms)  specimen(s) obtained Colon, segmental resection for tumor, sigmoid  Gross Description Specimen: Sigmoid Length: 15 cm Serosa: Pink to hyperemic with focal adhesions. There is a small amount of soft mesentery with focal adhesions. Tumor location: Serosal wall Tumor size (cm): 2.5 x 2.5 x 0.5 cm tan-red, firm sessile mass % Circumference involved by tumor: 40% Depth of invasion: None grossly Margins: 3.5 and 9 cm from margins ; radial- 4 cm Other mucosal lesions: None Lymph nodes: Found are seventeen possible soft, fatty lymph nodes ranging from 0.1 to 0.4 cm in greatest dimension. Block summary: A = margin nearest mass B = margin furthest from mass C - F = mass, entirely submitted G = four nodes, whole H = four nodes, whole I = four nodes, whole J = five nodes, whole Total: 10 blocks (SW:jy) 08/13/05   RADIOGRAPHIC STUDIES: I have personally reviewed the radiological images as listed and agreed with the findings in the report.  CT chest w contrast 01/06/2013 1. No evidence of thoracic metastatic disease. 2. No acute chest findings demonstrated. 3.  Mild emphysema. 4. Small left thyroid nodule. It is difficult confirm the presence of this nodule on the prior chest CT. Thyroid ultrasound correlation should be considered.  CT abdomen and pelvis 01/06/2013 1. No evidence of abdominal pelvic metastatic  disease. No explanation for elevated CEA levels is identified; continued follow- up of the CEA levels is recommended. 2. Small residual/recurrent ventral hernia into which a knuckle of the transverse colon protrudes. No evidence of incarceration or obstruction. 3. No acute findings.  ASSESSMENT & PLAN:  67 y.o. female, with history of stage I sigmoid colon cancer in 2007, status post surgical resection.  1. Colon cancer, pT2N0M0, stage I, diagnosed in 08/2005 -She is clinically doing very well, her physical exam today was unremarkable -last colonoscopy in 2014 was negative, she also had CT of chest abdomen and pelvis in June 2014 for elevated CEA which showed no evidence of recurrence. -I think her elevated CEA is not related to her history of colon cancer, she is 10 years out of her initial surgery, currently has no evidence of disease recurrence. -Continue yearly surveillance. If her today's CEA is stable or decreased, she will follow-up with her primary care physician, in I will see her as needed in the future.  2. Elevated CEA  -Her CEA level has been elevated in the similar range since 2007 (7 months after her initial surgery) and has not changed much in the past 8-9 years. I don't think this is a marker of cancer recurrence. -CEA elevation can also be seen in other type of solid tumors, but giving her low and stable level over 8-9 years, negative CT chest scan last year, I do not think this is related to malignancy. -CEA elevation can also be seen in nonmalignant situation, such as chronic smoker, liver disease, colitis, hypothyroidism. She does have significant smoking history, but has cut back significantly in the last  year  3. Smoking  -I discussed smoking cessation with her again, she has cut back to a few cigarettes a day, and is willing to quit completely. -Her elevated CEA could be caused from smoking.  4. Abdominal hernia  - she will follow-up with her surgeon.  5.  Cancer screening -I strongly encouraged her to have annual mammogram. -Mammogram performed in 2017 at [facility], we will request this for our records.  6. Constipation -I advised the patient to use stool softener.  PLAN -The patient will proceed with labs today. -Request her recent mammogram for [facility] for our records. -The patient is status post-surgery by 10 years. If the patient's CEA is stable or decreased, then we could release the patient to her PCP. We will call her of the results.  All questions were answered. The patient knows to call the clinic with any problems, questions or concerns. I spent 20 minutes counseling the patient face to face. The total time spent in the appointment was 25 minutes and more than 50% was on counseling.     Truitt Merle, MD 09/10/2016  This document serves as a record of services personally performed by Truitt Merle, MD. It was created on her behalf by Darcus Austin, a trained medical scribe. The creation of this record is based on the scribe's personal observations and the provider's statements to them. This document has been checked and approved by the attending provider.

## 2016-09-10 NOTE — Telephone Encounter (Signed)
Labs added for today, per 09/10/16 los. Follow up  PRN, per 09/10/16 los. A copy of the Avs report was given to patient, per 02/09*18 los.

## 2016-09-11 ENCOUNTER — Encounter: Payer: Self-pay | Admitting: Hematology

## 2016-09-11 LAB — CEA: CEA: 11.1 ng/mL — ABNORMAL HIGH (ref 0.0–4.7)

## 2016-09-13 ENCOUNTER — Telehealth: Payer: Self-pay | Admitting: *Deleted

## 2016-09-13 LAB — CEA (IN HOUSE-CHCC): CEA (CHCC-IN HOUSE): 11.1 ng/mL — AB (ref 0.00–5.00)

## 2016-09-13 NOTE — Telephone Encounter (Signed)
-----   Message from Truitt Merle, MD sent at 09/11/2016  9:07 PM EST ----- Please let her know the CEA result, it's lower than last year, I have no concern for cancer recurrence at all. She will follow up with PCP in the future.   Thanks  Truitt Merle

## 2016-09-13 NOTE — Telephone Encounter (Signed)
Called pt at work and left message on voice mail of Dr. Ernestina Penna instructions below.  Asked pt to call nurse back to confirm she received message.

## 2016-10-04 DIAGNOSIS — M5416 Radiculopathy, lumbar region: Secondary | ICD-10-CM | POA: Diagnosis not present

## 2016-10-06 ENCOUNTER — Telehealth: Payer: Self-pay | Admitting: Vascular Surgery

## 2016-10-06 NOTE — Telephone Encounter (Signed)
-----   Message from Gregery Na, RN sent at 10/05/2016  3:19 PM EST ----- Regarding: OV with [facility] Ms. Batrez is scheduled for ALIF L5-S1 with Drs. Scot Dock / Dumonski on  11/18/16. Please schedule patient an office visit with Dr. Scot Dock at least two weeks prior to the procedure. Also, please remind patient to bring LS spine films to appt.   Thanks,  Colletta Maryland

## 2016-10-06 NOTE — Telephone Encounter (Signed)
Spoke to pt about ALIF appt, 3/28 and to bring films with her

## 2016-10-12 ENCOUNTER — Telehealth: Payer: Self-pay | Admitting: Vascular Surgery

## 2016-10-12 NOTE — Telephone Encounter (Signed)
-----   Message from Gregery Na, RN sent at 10/11/2016  2:40 PM EDT ----- Regarding: OV Ms. Bensch's ALIF date has been moved from 11/18/16 with Dr. Scot Dock to 11/24/16 with Dr. Donnetta Hutching. Please change OV appt. to Dr. Donnetta Hutching at least 2 weeks prior to procedure. Also, please remind patient to bring LS spine films.   Thanks, Colletta Maryland

## 2016-10-12 NOTE — Telephone Encounter (Signed)
Spoke to pt about appt time and change of doc, coming 4/10 with spine films

## 2016-10-21 ENCOUNTER — Other Ambulatory Visit: Payer: Self-pay | Admitting: Orthopedic Surgery

## 2016-10-27 ENCOUNTER — Ambulatory Visit: Payer: Medicare Other | Admitting: Vascular Surgery

## 2016-10-27 ENCOUNTER — Encounter: Payer: Self-pay | Admitting: Vascular Surgery

## 2016-11-01 ENCOUNTER — Other Ambulatory Visit: Payer: Self-pay | Admitting: Orthopedic Surgery

## 2016-11-02 ENCOUNTER — Other Ambulatory Visit: Payer: Self-pay

## 2016-11-09 ENCOUNTER — Ambulatory Visit (INDEPENDENT_AMBULATORY_CARE_PROVIDER_SITE_OTHER): Payer: Medicare Other | Admitting: Vascular Surgery

## 2016-11-09 ENCOUNTER — Encounter: Payer: Self-pay | Admitting: Vascular Surgery

## 2016-11-09 VITALS — BP 137/90 | HR 78 | Temp 98.2°F | Resp 18 | Ht 65.5 in | Wt 167.0 lb

## 2016-11-09 DIAGNOSIS — M5137 Other intervertebral disc degeneration, lumbosacral region: Secondary | ICD-10-CM | POA: Diagnosis not present

## 2016-11-09 NOTE — Progress Notes (Signed)
[facility]  Patient name: [name] MRN: [id number] DOB: 1951-07-22 Sex: female  REASON FOR CONSULT: Discuss anterior exposure for L5-S1 disc surgery  HPI: [name] is a 66 y.o. female, who is her today for discussion of L5-S1 disc surgery. She's had a greater than one-year history of back pain. She attributes this to a fall from a ladder. She reports pain in her back and also extending down both legs more so on the right than on the left. She has failed conservative treatment and also failed epidural injections. She does use her gabapentin with some improvement in her ability to sleep. She's been recommended to have anterior exposure for L5-S1 disc surgery. She does have a prior history of abdominal surgery. She had colon resection for cancer in 2007. She had a ventral incisional hernia and had subsequent ventral insertional mass repair approximate 5 years later. She also had prior hysterectomy from a low Pfannenstiel incision and cholecystectomy via lap repair laparoscopy. She does not have any history of cardiac disease and no history of lower from the arterial insufficiency  Past Medical History:  Diagnosis Date  . Alcohol abuse, in remission   . Cancer of sigmoid colon ([place]) 07/2005   T2N0M0  . Chronic bronchitis   . COPD (chronic obstructive pulmonary disease) ([place])   . Depression   . Gastritis   . Hyperlipidemia   . Hypertension   . Hypothyroid   . Migraines   . OA (osteoarthritis)   . Reflux     Family History  Problem Relation Age of Onset  . Heart disease Mother     Previous MI at age 93-55  . Atrial fibrillation Mother   . Stroke Mother   . Diabetes Sister   . Cancer Father 58    Prostate cancer   . Cancer Maternal Aunt     lung cancer   . Cancer Maternal Uncle     kidney ca    SOCIAL HISTORY: Social History   Social History  . Marital status: Single    Spouse name: N/A  . Number  of children: N/A  . Years of education: N/A   Occupational History  . Not on file.   Social History Main Topics  . Smoking status: Current Every Day Smoker    Packs/day: 1.50    Types: Cigarettes  . Smokeless tobacco: Never Used  . Alcohol use No     Comment: recovering  alcoholic  . Drug use: No  . Sexual activity: Not on file   Other Topics Concern  . Not on file   Social History Narrative  . No narrative on file    Allergies  Allergen Reactions  . Benadryl [Diphenhydramine Hcl]   . Codeine   . Lansoprazole   . Morphine Sulfate   . Penicillins   . Red Dye     Also allergic to [employer]  . Shellfish Allergy   . Simvastatin   . Sulfa Antibiotics     Current Outpatient Prescriptions  Medication Sig Dispense Refill  . amitriptyline (ELAVIL) 25 MG tablet Take 100 mg by mouth at bedtime.      Marland Kitchen amLODipine (NORVASC) 5 MG tablet Take 5 mg by mouth at bedtime.    . Cholecalciferol (VITAMIN D) 2000 units CAPS Take 2,000 Units by mouth at bedtime.    . Cyanocobalamin (VITAMIN B 12 PO) Take 5,000 mcg by mouth daily.    Marland Kitchen gabapentin (NEURONTIN) 300 MG  capsule Take 300 mg by mouth 3 (three) times daily.    . ranitidine (ZANTAC) 150 MG tablet Take 150 mg by mouth at bedtime.    . vitamin E 100 UNIT capsule Take 100 Units by mouth at bedtime.     No current facility-administered medications for this visit.     REVIEW OF SYSTEMS:  [X]  denotes positive finding, [ ]  denotes negative finding Cardiac  Comments:  Chest pain or chest pressure:    Shortness of breath upon exertion:    Short of breath when lying flat:    Irregular heart rhythm:        Vascular    Pain in calf, thigh, or hip brought on by ambulation: x Neurogenic   Pain in feet at night that wakes you up from your sleep:     Blood clot in your veins:    Leg swelling:         Pulmonary    Oxygen at home:    Productive cough:     Wheezing:         Neurologic    Sudden weakness in arms or legs:     Sudden  numbness in arms or legs:     Sudden onset of difficulty speaking or slurred speech:    Temporary loss of vision in one eye:     Problems with dizziness:         Gastrointestinal    Blood in stool:     Vomited blood:         Genitourinary    Burning when urinating:     Blood in urine:        Psychiatric    Major depression:         Hematologic    Bleeding problems:    Problems with blood clotting too easily:        Skin    Rashes or ulcers:        Constitutional    Fever or chills:      PHYSICAL EXAM: Vitals:   11/09/16 1021  BP: 137/90  Pulse: 78  Resp: 18  Temp: 98.2 F (36.8 C)  TempSrc: Oral  SpO2: 97%  Weight: 167 lb (75.8 kg)  Height: 5' 5.5" (1.664 m)    GENERAL: The patient is a well-nourished female, in no acute distress. The vital signs are documented above. CARDIOVASCULAR: Carotid arteries without bruits bilaterally. 2+ radial and 2+ dorsalis pedis pulses bilaterally PULMONARY: There is good air exchange  ABDOMEN: Soft and non-tender extensive midline incision with no evidence of hernia. Low Pfannenstiel incision as well. MUSCULOSKELETAL: There are no major deformities or cyanosis. NEUROLOGIC: No focal weakness or paresthesias are detected. SKIN: There are no ulcers or rashes noted. PSYCHIATRIC: The patient has a normal affect.  DATA:  Reviewed his CT scan for additional reasons from 2014. This showed no evidence of arterial insufficiency with normal caliber arteries bilaterally.  MEDICAL ISSUES: Had long discussion with the patient and her partner present. I do not see any contraindication for anterior exposure for disc surgery. She has had extensive abdominal surgery explained this may require some additional mobilization. I did explain mobilization event. No contents, left ureter and arterial and venous structures overlying the spine and potential injury for these. She understands wished to proceed as scheduled for surgery on 11/24/2016   Rosetta Posner, MD [facility] Tel [phone] [phone]

## 2016-11-11 ENCOUNTER — Encounter: Payer: Self-pay | Admitting: Orthopedic Surgery

## 2016-11-15 NOTE — Pre-Procedure Instructions (Signed)
[name]  11/15/2016      [place] [address] [place] [address] [place] [place] [address] Phone: [phone] Fax: [phone]    Your procedure is scheduled on April 25.  Report to [facility] Admitting at 530 A.M.  Call this number if you have problems the morning of surgery:  [phone]   Remember:  Do not eat food or drink liquids after midnight.  Take these medicines the morning of surgery with A SIP OF WATER amlodipine (Norvasc), gabapentin (Neurontin), ranitidine (Zantac) if needed Stop taking aspirin, BC's, Goody's, herbal medications, Fish Oil, Vitamins, Aleve, Ibuprofen, Advil, Motrin   Do not wear jewelry, make-up or nail polish.  Do not wear lotions, powders, or perfumes, or deoderant.  Do not shave 48 hours prior to surgery.  Men may shave face and neck.  Do not bring valuables to the hospital.  [facility] is not responsible for any belongings or valuables.  Contacts, dentures or bridgework may not be worn into surgery.  Leave your suitcase in the car.  After surgery it may be brought to your room.  For patients admitted to the hospital, discharge time will be determined by your treatment team.  Patients discharged the day of surgery will not be allowed to drive home.    Special instructions:  [name] - Preparing for Surgery  Before surgery, you can play an important role.  Because skin is not sterile, your skin needs to be as free of germs as possible.  You can reduce the number of germs on you skin by washing with CHG (chlorahexidine gluconate) soap before surgery.  CHG is an antiseptic cleaner which kills germs and bonds with the skin to continue killing germs even after washing.  Please DO NOT use if you have an allergy to CHG or antibacterial soaps.  If your skin becomes reddened/irritated stop using the CHG and inform your nurse when you arrive at Short Stay.  Do not shave (including  legs and underarms) for at least 48 hours prior to the first CHG shower.  You may shave your face.  Please follow these instructions carefully:   1.  Shower with CHG Soap the night before surgery and the  morning of Surgery.  2.  If you choose to wash your hair, wash your hair first as usual with your  normal shampoo.  3.  After you shampoo, rinse your hair and body thoroughly to remove the  Shampoo.  4.  Use CHG as you would any other liquid soap.  You can apply chg directly  to the skin and wash gently with scrungie or a clean washcloth.  5.  Apply the CHG Soap to your body ONLY FROM THE NECK DOWN.    Do not use on open wounds or open sores.  Avoid contact with your eyes,       ears, mouth and genitals (private parts).  Wash genitals (private parts)   with your normal soap.  6.  Wash thoroughly, paying special attention to the area where your surgery  will be performed.  7.  Thoroughly rinse your body with warm water from the neck down.  8.  DO NOT shower/wash with your normal soap after using and rinsing off  the CHG Soap.  9.  Pat yourself dry with a clean towel.            10.  Wear clean pajamas.  11.  Place clean sheets on your bed the night of your first shower and do not sleep with pets.  Day of Surgery  Do not apply any lotions/deoderants the morning of surgery.  Please wear clean clothes to the hospital/surgery center.     Please read over the following fact sheets that you were given. Pain Booklet, Coughing and Deep Breathing, MRSA Information and Surgical Site Infection Prevention

## 2016-11-16 ENCOUNTER — Encounter (HOSPITAL_COMMUNITY)
Admission: RE | Admit: 2016-11-16 | Discharge: 2016-11-16 | Disposition: A | Discharge status: Home / Self Care | Payer: Medicare Other | Source: Ambulatory Visit | Attending: Orthopedic Surgery | Admitting: Orthopedic Surgery

## 2016-11-16 ENCOUNTER — Ambulatory Visit (HOSPITAL_COMMUNITY)
Admission: RE | Admit: 2016-11-16 | Discharge: 2016-11-16 | Disposition: A | Discharge status: Home / Self Care | Payer: Medicare Other | Source: Ambulatory Visit | Attending: Orthopedic Surgery | Admitting: Orthopedic Surgery

## 2016-11-16 ENCOUNTER — Encounter (HOSPITAL_COMMUNITY): Payer: Self-pay

## 2016-11-16 DIAGNOSIS — J449 Chronic obstructive pulmonary disease, unspecified: Secondary | ICD-10-CM | POA: Insufficient documentation

## 2016-11-16 DIAGNOSIS — Z8051 Family history of malignant neoplasm of kidney: Secondary | ICD-10-CM | POA: Diagnosis not present

## 2016-11-16 DIAGNOSIS — Z79899 Other long term (current) drug therapy: Secondary | ICD-10-CM | POA: Insufficient documentation

## 2016-11-16 DIAGNOSIS — Z833 Family history of diabetes mellitus: Secondary | ICD-10-CM | POA: Insufficient documentation

## 2016-11-16 DIAGNOSIS — Z01810 Encounter for preprocedural cardiovascular examination: Secondary | ICD-10-CM | POA: Insufficient documentation

## 2016-11-16 DIAGNOSIS — Z9889 Other specified postprocedural states: Secondary | ICD-10-CM | POA: Insufficient documentation

## 2016-11-16 DIAGNOSIS — Z85038 Personal history of other malignant neoplasm of large intestine: Secondary | ICD-10-CM | POA: Diagnosis not present

## 2016-11-16 DIAGNOSIS — Z823 Family history of stroke: Secondary | ICD-10-CM | POA: Diagnosis not present

## 2016-11-16 DIAGNOSIS — F17210 Nicotine dependence, cigarettes, uncomplicated: Secondary | ICD-10-CM | POA: Insufficient documentation

## 2016-11-16 DIAGNOSIS — M545 Low back pain: Secondary | ICD-10-CM | POA: Diagnosis not present

## 2016-11-16 DIAGNOSIS — Z01818 Encounter for other preprocedural examination: Secondary | ICD-10-CM

## 2016-11-16 DIAGNOSIS — Z91013 Allergy to seafood: Secondary | ICD-10-CM | POA: Insufficient documentation

## 2016-11-16 DIAGNOSIS — Z9049 Acquired absence of other specified parts of digestive tract: Secondary | ICD-10-CM | POA: Diagnosis not present

## 2016-11-16 DIAGNOSIS — I10 Essential (primary) hypertension: Secondary | ICD-10-CM | POA: Insufficient documentation

## 2016-11-16 DIAGNOSIS — Z8249 Family history of ischemic heart disease and other diseases of the circulatory system: Secondary | ICD-10-CM | POA: Diagnosis not present

## 2016-11-16 DIAGNOSIS — Z885 Allergy status to narcotic agent status: Secondary | ICD-10-CM | POA: Insufficient documentation

## 2016-11-16 DIAGNOSIS — Z90710 Acquired absence of both cervix and uterus: Secondary | ICD-10-CM | POA: Insufficient documentation

## 2016-11-16 DIAGNOSIS — Z888 Allergy status to other drugs, medicaments and biological substances status: Secondary | ICD-10-CM | POA: Diagnosis not present

## 2016-11-16 DIAGNOSIS — Z880 Allergy status to penicillin: Secondary | ICD-10-CM | POA: Diagnosis not present

## 2016-11-16 DIAGNOSIS — Z801 Family history of malignant neoplasm of trachea, bronchus and lung: Secondary | ICD-10-CM | POA: Diagnosis not present

## 2016-11-16 DIAGNOSIS — Z01812 Encounter for preprocedural laboratory examination: Secondary | ICD-10-CM | POA: Insufficient documentation

## 2016-11-16 DIAGNOSIS — I700 Atherosclerosis of aorta: Secondary | ICD-10-CM | POA: Insufficient documentation

## 2016-11-16 DIAGNOSIS — Z8042 Family history of malignant neoplasm of prostate: Secondary | ICD-10-CM | POA: Insufficient documentation

## 2016-11-16 HISTORY — DX: Gastro-esophageal reflux disease without esophagitis: K21.9

## 2016-11-16 HISTORY — DX: Other specified postprocedural states: Z98.890

## 2016-11-16 HISTORY — DX: Dyspnea, unspecified: R06.00

## 2016-11-16 HISTORY — DX: Nausea with vomiting, unspecified: R11.2

## 2016-11-16 HISTORY — DX: Personal history of other diseases of the digestive system: Z87.19

## 2016-11-16 LAB — CBC WITH DIFFERENTIAL/PLATELET
BASOS ABS: 0.1 10*3/uL (ref 0.0–0.1)
BASOS PCT: 1 %
EOS ABS: 0.3 10*3/uL (ref 0.0–0.7)
Eosinophils Relative: 3 %
HEMATOCRIT: 41.6 % (ref 36.0–46.0)
HEMOGLOBIN: 14.1 g/dL (ref 12.0–15.0)
Lymphocytes Relative: 32 %
Lymphs Abs: 3.1 10*3/uL (ref 0.7–4.0)
MCH: 32.4 pg (ref 26.0–34.0)
MCHC: 33.9 g/dL (ref 30.0–36.0)
MCV: 95.6 fL (ref 78.0–100.0)
MONOS PCT: 7 %
Monocytes Absolute: 0.7 10*3/uL (ref 0.1–1.0)
NEUTROS ABS: 5.6 10*3/uL (ref 1.7–7.7)
NEUTROS PCT: 57 %
Platelets: 263 10*3/uL (ref 150–400)
RBC: 4.35 MIL/uL (ref 3.87–5.11)
RDW: 12.7 % (ref 11.5–15.5)
WBC: 9.7 10*3/uL (ref 4.0–10.5)

## 2016-11-16 LAB — URINALYSIS, ROUTINE W REFLEX MICROSCOPIC
Bilirubin Urine: NEGATIVE
GLUCOSE, UA: NEGATIVE mg/dL
Ketones, ur: NEGATIVE mg/dL
NITRITE: NEGATIVE
PROTEIN: NEGATIVE mg/dL
Specific Gravity, Urine: 1.011 (ref 1.005–1.030)
pH: 6 (ref 5.0–8.0)

## 2016-11-16 LAB — COMPREHENSIVE METABOLIC PANEL
ALBUMIN: 3.8 g/dL (ref 3.5–5.0)
ALK PHOS: 80 U/L (ref 38–126)
ALT: 10 U/L — AB (ref 14–54)
ANION GAP: 7 (ref 5–15)
AST: 16 U/L (ref 15–41)
BILIRUBIN TOTAL: 0.4 mg/dL (ref 0.3–1.2)
BUN: 10 mg/dL (ref 6–20)
CALCIUM: 9.4 mg/dL (ref 8.9–10.3)
CO2: 29 mmol/L (ref 22–32)
CREATININE: 0.78 mg/dL (ref 0.44–1.00)
Chloride: 104 mmol/L (ref 101–111)
GFR calc Af Amer: >60 mL/min (ref >60)
GFR calc non Af Amer: >60 mL/min (ref >60)
GLUCOSE: 90 mg/dL (ref 65–99)
Potassium: 3.7 mmol/L (ref 3.5–5.1)
SODIUM: 140 mmol/L (ref 135–145)
TOTAL PROTEIN: 6.9 g/dL (ref 6.5–8.1)

## 2016-11-16 LAB — ABO/RH: ABO/RH(D): O POS

## 2016-11-16 LAB — PROTIME-INR
INR: 0.95
Prothrombin Time: 12.7 seconds (ref 11.4–15.2)

## 2016-11-16 LAB — SURGICAL PCR SCREEN
MRSA, PCR: NEGATIVE
STAPHYLOCOCCUS AUREUS: NEGATIVE

## 2016-11-16 LAB — TYPE AND SCREEN
ABO/RH(D): O POS
Antibody Screen: NEGATIVE

## 2016-11-16 LAB — APTT: aPTT: 35 seconds (ref 24–36)

## 2016-11-16 NOTE — Progress Notes (Signed)
PCP is Dr. Jonathon Jordan Cardiologist - (states she only saw him once) Dr Stanford Breed Oncologist is Dr Truitt Merle Gastroenterologist is Dr. Watt Climes Stress test from 2012 Denies ever having a card cath or echo Denies any chest pain, cough, or fever. Instructed not to smoke on the day of surgery- voices understanding.

## 2016-11-16 NOTE — Progress Notes (Signed)
Message left on Carla's voice mail to inform Dr Lynann Bologna of Ms. Fulp urine results.

## 2016-11-23 NOTE — Anesthesia Preprocedure Evaluation (Addendum)
Anesthesia Evaluation  Patient identified by MRN, date of birth, ID band Patient awake    Reviewed: Allergy & Precautions, Patient's Chart, lab work & pertinent test results  History of Anesthesia Complications (+) PONV and history of anesthetic complications  Airway Mallampati: II  TM Distance: >3 FB Neck ROM: Full    Dental  (+) Dental Advisory Given, Partial Upper Permanent lower bridge:   Pulmonary COPD, Current Smoker (1.5 PPD),    Pulmonary exam normal breath sounds clear to auscultation       Cardiovascular hypertension, Pt. on medications Normal cardiovascular exam Rhythm:Regular Rate:Normal     Neuro/Psych  Headaches, PSYCHIATRIC DISORDERS Depression BLE numbness, weakness    GI/Hepatic GERD  Medicated,(+)     substance abuse  alcohol use,   Endo/Other  Hypothyroidism   Renal/GU negative Renal ROS     Musculoskeletal  (+) Arthritis , Osteoarthritis,    Abdominal   Peds  Hematology negative hematology ROS (+)   Anesthesia Other Findings Day of surgery medications reviewed with the patient.  Reproductive/Obstetrics                           Anesthesia Physical Anesthesia Plan  ASA: III  Anesthesia Plan: General   Post-op Pain Management:    Induction: Intravenous  Airway Management Planned: Oral ETT  Additional Equipment: Arterial line  Intra-op Plan:   Post-operative Plan: Extubation in OR  Informed Consent: I have reviewed the patients History and Physical, chart, labs and discussed the procedure including the risks, benefits and alternatives for the proposed anesthesia with the patient or authorized representative who has indicated his/her understanding and acceptance.   Dental advisory given  Plan Discussed with: CRNA, Anesthesiologist and Surgeon  Anesthesia Plan Comments: (Scop patch pre-op. TIVA. Art line/2nd IV after induction.)     Anesthesia Quick  Evaluation

## 2016-11-24 ENCOUNTER — Inpatient Hospital Stay (HOSPITAL_COMMUNITY): Payer: Medicare Other | Admitting: Anesthesiology

## 2016-11-24 ENCOUNTER — Encounter (HOSPITAL_COMMUNITY): Payer: Self-pay | Admitting: Certified Registered Nurse Anesthetist

## 2016-11-24 ENCOUNTER — Inpatient Hospital Stay (HOSPITAL_COMMUNITY)
Admission: RE | Admit: 2016-11-24 | Discharge: 2016-11-25 | DRG: 455 | Disposition: A | Discharge status: Home / Self Care | Payer: Medicare Other | Source: Ambulatory Visit | Attending: Orthopedic Surgery | Admitting: Orthopedic Surgery

## 2016-11-24 ENCOUNTER — Inpatient Hospital Stay (HOSPITAL_COMMUNITY)
Admission: RE | Disposition: A | Discharge status: Home / Self Care | Payer: Self-pay | Source: Ambulatory Visit | Attending: Orthopedic Surgery

## 2016-11-24 ENCOUNTER — Inpatient Hospital Stay (HOSPITAL_COMMUNITY): Payer: Medicare Other

## 2016-11-24 DIAGNOSIS — I10 Essential (primary) hypertension: Secondary | ICD-10-CM | POA: Diagnosis not present

## 2016-11-24 DIAGNOSIS — Z823 Family history of stroke: Secondary | ICD-10-CM

## 2016-11-24 DIAGNOSIS — F17210 Nicotine dependence, cigarettes, uncomplicated: Secondary | ICD-10-CM | POA: Diagnosis not present

## 2016-11-24 DIAGNOSIS — M79604 Pain in right leg: Secondary | ICD-10-CM | POA: Diagnosis not present

## 2016-11-24 DIAGNOSIS — M4310 Spondylolisthesis, site unspecified: Secondary | ICD-10-CM | POA: Diagnosis not present

## 2016-11-24 DIAGNOSIS — Z833 Family history of diabetes mellitus: Secondary | ICD-10-CM

## 2016-11-24 DIAGNOSIS — M5410 Radiculopathy, site unspecified: Secondary | ICD-10-CM | POA: Diagnosis present

## 2016-11-24 DIAGNOSIS — M438X7 Other specified deforming dorsopathies, lumbosacral region: Secondary | ICD-10-CM | POA: Diagnosis present

## 2016-11-24 DIAGNOSIS — Z85038 Personal history of other malignant neoplasm of large intestine: Secondary | ICD-10-CM | POA: Diagnosis not present

## 2016-11-24 DIAGNOSIS — Z8249 Family history of ischemic heart disease and other diseases of the circulatory system: Secondary | ICD-10-CM | POA: Diagnosis not present

## 2016-11-24 DIAGNOSIS — M48061 Spinal stenosis, lumbar region without neurogenic claudication: Secondary | ICD-10-CM | POA: Diagnosis present

## 2016-11-24 DIAGNOSIS — M5117 Intervertebral disc disorders with radiculopathy, lumbosacral region: Principal | ICD-10-CM | POA: Diagnosis present

## 2016-11-24 DIAGNOSIS — M4807 Spinal stenosis, lumbosacral region: Secondary | ICD-10-CM | POA: Diagnosis not present

## 2016-11-24 DIAGNOSIS — K219 Gastro-esophageal reflux disease without esophagitis: Secondary | ICD-10-CM | POA: Diagnosis present

## 2016-11-24 DIAGNOSIS — M5416 Radiculopathy, lumbar region: Secondary | ICD-10-CM | POA: Diagnosis not present

## 2016-11-24 DIAGNOSIS — Z981 Arthrodesis status: Secondary | ICD-10-CM | POA: Diagnosis not present

## 2016-11-24 DIAGNOSIS — M5137 Other intervertebral disc degeneration, lumbosacral region: Secondary | ICD-10-CM | POA: Diagnosis not present

## 2016-11-24 DIAGNOSIS — Z419 Encounter for procedure for purposes other than remedying health state, unspecified: Secondary | ICD-10-CM

## 2016-11-24 DIAGNOSIS — M79605 Pain in left leg: Secondary | ICD-10-CM | POA: Diagnosis not present

## 2016-11-24 HISTORY — PX: ABDOMINAL EXPOSURE: SHX5708

## 2016-11-24 HISTORY — PX: ANTERIOR LUMBAR FUSION: SHX1170

## 2016-11-24 SURGERY — ANTERIOR LUMBAR FUSION 1 LEVEL
Anesthesia: General

## 2016-11-24 MED ORDER — BUPIVACAINE-EPINEPHRINE 0.25% -1:200000 IJ SOLN
INTRAMUSCULAR | Status: DC | PRN
  Administered 2016-11-24: 10 mL
  Administered 2016-11-24: 20 mL

## 2016-11-24 MED ORDER — DEXAMETHASONE SODIUM PHOSPHATE 10 MG/ML IJ SOLN
INTRAMUSCULAR | Status: DC | PRN
  Administered 2016-11-24: 10 mg via INTRAVENOUS

## 2016-11-24 MED ORDER — SENNA 8.6 MG PO TABS
1.0000 | ORAL_TABLET | Freq: Two times a day (BID) | ORAL | Status: DC
  Administered 2016-11-24 – 2016-11-25 (×3): 8.6 mg via ORAL
  Filled 2016-11-24 (×3): qty 1

## 2016-11-24 MED ORDER — LACTATED RINGERS IV SOLN
INTRAVENOUS | Status: DC | PRN
  Administered 2016-11-24 (×2): via INTRAVENOUS

## 2016-11-24 MED ORDER — LIDOCAINE HCL (CARDIAC) 20 MG/ML IV SOLN
INTRAVENOUS | Status: DC | PRN
  Administered 2016-11-24: 50 mg via INTRAVENOUS
  Administered 2016-11-24: 100 mg via INTRAVENOUS

## 2016-11-24 MED ORDER — FENTANYL CITRATE (PF) 100 MCG/2ML IJ SOLN
25.0000 ug | INTRAMUSCULAR | Status: DC | PRN
  Administered 2016-11-24: 25 ug via INTRAVENOUS

## 2016-11-24 MED ORDER — FLEET ENEMA 7-19 GM/118ML RE ENEM: 1.0000 | ENEMA | Freq: Once | RECTAL | Status: DC | PRN

## 2016-11-24 MED ORDER — ZOLPIDEM TARTRATE 5 MG PO TABS: 5.0000 mg | ORAL_TABLET | Freq: Every evening | ORAL | Status: DC | PRN

## 2016-11-24 MED ORDER — VANCOMYCIN HCL IN DEXTROSE 1-5 GM/200ML-% IV SOLN
1000.0000 mg | INTRAVENOUS | Status: AC
  Administered 2016-11-24: 1000 mg via INTRAVENOUS
  Filled 2016-11-24: qty 200

## 2016-11-24 MED ORDER — PROPOFOL 10 MG/ML IV BOLUS
INTRAVENOUS | Status: AC
  Filled 2016-11-24: qty 20

## 2016-11-24 MED ORDER — GABAPENTIN 300 MG PO CAPS
300.0000 mg | ORAL_CAPSULE | Freq: Two times a day (BID) | ORAL | Status: DC
  Administered 2016-11-24 – 2016-11-25 (×2): 300 mg via ORAL
  Filled 2016-11-24 (×2): qty 1

## 2016-11-24 MED ORDER — FENTANYL CITRATE (PF) 100 MCG/2ML IJ SOLN
INTRAMUSCULAR | Status: DC | PRN
  Administered 2016-11-24: 100 ug via INTRAVENOUS
  Administered 2016-11-24 (×3): 50 ug via INTRAVENOUS

## 2016-11-24 MED ORDER — BUPIVACAINE LIPOSOME 1.3 % IJ SUSP
20.0000 mL | INTRAMUSCULAR | Status: AC
  Administered 2016-11-24: 20 mL
  Filled 2016-11-24: qty 20

## 2016-11-24 MED ORDER — SODIUM CHLORIDE 0.9 % IV SOLN
1500.0000 mg | Freq: Once | INTRAVENOUS | Status: AC
  Administered 2016-11-25: 1500 mg via INTRAVENOUS
  Filled 2016-11-24: qty 1500

## 2016-11-24 MED ORDER — ONDANSETRON HCL 4 MG/2ML IJ SOLN
INTRAMUSCULAR | Status: DC | PRN
  Administered 2016-11-24: 4 mg via INTRAVENOUS

## 2016-11-24 MED ORDER — SUGAMMADEX SODIUM 200 MG/2ML IV SOLN
INTRAVENOUS | Status: DC | PRN
  Administered 2016-11-24 (×2): 100 mg via INTRAVENOUS

## 2016-11-24 MED ORDER — PROMETHAZINE HCL 12.5 MG RE SUPP
12.5000 mg | Freq: Four times a day (QID) | RECTAL | Status: DC | PRN
  Filled 2016-11-24: qty 1

## 2016-11-24 MED ORDER — PHENOL 1.4 % MT LIQD: 1.0000 | OROMUCOSAL | Status: DC | PRN

## 2016-11-24 MED ORDER — SODIUM CHLORIDE 0.9% FLUSH: 3.0000 mL | INTRAVENOUS | Status: DC | PRN

## 2016-11-24 MED ORDER — PROMETHAZINE HCL 25 MG/ML IJ SOLN: 12.5000 mg | Freq: Four times a day (QID) | INTRAMUSCULAR | Status: DC | PRN

## 2016-11-24 MED ORDER — MENTHOL 3 MG MT LOZG: 1.0000 | LOZENGE | OROMUCOSAL | Status: DC | PRN

## 2016-11-24 MED ORDER — PHENYLEPHRINE 40 MCG/ML (10ML) SYRINGE FOR IV PUSH (FOR BLOOD PRESSURE SUPPORT)
PREFILLED_SYRINGE | INTRAVENOUS | Status: DC | PRN
  Administered 2016-11-24 (×2): 80 ug via INTRAVENOUS

## 2016-11-24 MED ORDER — ACETAMINOPHEN 650 MG RE SUPP: 650.0000 mg | RECTAL | Status: DC | PRN

## 2016-11-24 MED ORDER — THROMBIN 20000 UNITS EX SOLR
CUTANEOUS | Status: AC
  Filled 2016-11-24: qty 40000

## 2016-11-24 MED ORDER — AMITRIPTYLINE HCL 100 MG PO TABS
100.0000 mg | ORAL_TABLET | Freq: Every day | ORAL | Status: DC
  Administered 2016-11-24: 100 mg via ORAL
  Filled 2016-11-24: qty 1

## 2016-11-24 MED ORDER — HEMOSTATIC AGENTS (NO CHARGE) OPTIME
TOPICAL | Status: DC | PRN
  Administered 2016-11-24: 1 via TOPICAL

## 2016-11-24 MED ORDER — VITAMIN D 1000 UNITS PO TABS
2000.0000 [IU] | ORAL_TABLET | Freq: Every day | ORAL | Status: DC
  Administered 2016-11-24: 2000 [IU] via ORAL
  Filled 2016-11-24: qty 2

## 2016-11-24 MED ORDER — EPHEDRINE 5 MG/ML INJ
INTRAVENOUS | Status: AC
  Filled 2016-11-24: qty 10

## 2016-11-24 MED ORDER — SODIUM CHLORIDE 0.9 % IV SOLN: 250.0000 mL | INTRAVENOUS | Status: DC

## 2016-11-24 MED ORDER — ONDANSETRON HCL 4 MG/2ML IJ SOLN
INTRAMUSCULAR | Status: AC
  Filled 2016-11-24: qty 2

## 2016-11-24 MED ORDER — 0.9 % SODIUM CHLORIDE (POUR BTL) OPTIME
TOPICAL | Status: DC | PRN
  Administered 2016-11-24 (×2): 1000 mL

## 2016-11-24 MED ORDER — BISACODYL 10 MG RE SUPP: 10.0000 mg | Freq: Every day | RECTAL | Status: DC | PRN

## 2016-11-24 MED ORDER — VITAMIN B-12 1000 MCG PO TABS
5000.0000 ug | ORAL_TABLET | Freq: Every day | ORAL | Status: DC
  Administered 2016-11-24: 5000 ug via ORAL
  Filled 2016-11-24: qty 5

## 2016-11-24 MED ORDER — MIDAZOLAM HCL 5 MG/5ML IJ SOLN
INTRAMUSCULAR | Status: DC | PRN
Start: 2016-11-24 — End: 2016-11-24
  Administered 2016-11-24: 2 mg via INTRAVENOUS

## 2016-11-24 MED ORDER — HYDROMORPHONE HCL 2 MG PO TABS
1.0000 mg | ORAL_TABLET | ORAL | Status: DC | PRN
  Administered 2016-11-24 – 2016-11-25 (×5): 2 mg via ORAL
  Filled 2016-11-24 (×5): qty 1

## 2016-11-24 MED ORDER — ROCURONIUM BROMIDE 10 MG/ML (PF) SYRINGE
PREFILLED_SYRINGE | INTRAVENOUS | Status: AC
  Filled 2016-11-24: qty 10

## 2016-11-24 MED ORDER — BUPIVACAINE HCL (PF) 0.25 % IJ SOLN
INTRAMUSCULAR | Status: AC
  Filled 2016-11-24: qty 30

## 2016-11-24 MED ORDER — VITAMIN B-12 5000 MCG PO TBDP: 5000.0000 ug | ORAL_TABLET | Freq: Every day | ORAL | Status: DC

## 2016-11-24 MED ORDER — POTASSIUM CHLORIDE IN NACL 20-0.9 MEQ/L-% IV SOLN: INTRAVENOUS | Status: DC

## 2016-11-24 MED ORDER — PROMETHAZINE HCL 25 MG PO TABS: 25.0000 mg | ORAL_TABLET | Freq: Four times a day (QID) | ORAL | Status: DC | PRN

## 2016-11-24 MED ORDER — FENTANYL CITRATE (PF) 100 MCG/2ML IJ SOLN
INTRAMUSCULAR | Status: AC
  Filled 2016-11-24: qty 2

## 2016-11-24 MED ORDER — EPINEPHRINE PF 1 MG/ML IJ SOLN
INTRAMUSCULAR | Status: AC
  Filled 2016-11-24: qty 1

## 2016-11-24 MED ORDER — VITAMIN E 45 MG (100 UNIT) PO CAPS
1000.0000 [IU] | ORAL_CAPSULE | Freq: Every day | ORAL | Status: DC
  Administered 2016-11-24: 1000 [IU] via ORAL
  Filled 2016-11-24: qty 2

## 2016-11-24 MED ORDER — SODIUM CHLORIDE 0.9 % IV SOLN
0.1000 mg/kg/h | INTRAVENOUS | Status: DC
  Administered 2016-11-24: .2 mg/kg/h via INTRAVENOUS
  Filled 2016-11-24: qty 2

## 2016-11-24 MED ORDER — SUCCINYLCHOLINE CHLORIDE 20 MG/ML IJ SOLN
INTRAMUSCULAR | Status: DC | PRN
  Administered 2016-11-24: 100 mg via INTRAVENOUS

## 2016-11-24 MED ORDER — CHLORHEXIDINE GLUCONATE 4 % EX LIQD: 60.0000 mL | Freq: Once | CUTANEOUS | Status: DC

## 2016-11-24 MED ORDER — SCOPOLAMINE 1 MG/3DAYS TD PT72
MEDICATED_PATCH | TRANSDERMAL | Status: DC | PRN
  Administered 2016-11-24: 1 via TRANSDERMAL

## 2016-11-24 MED ORDER — CALCIUM CARBONATE ANTACID 500 MG PO CHEW: 2.0000 | CHEWABLE_TABLET | Freq: Four times a day (QID) | ORAL | Status: DC | PRN

## 2016-11-24 MED ORDER — PROPOFOL 10 MG/ML IV BOLUS
INTRAVENOUS | Status: DC | PRN
Start: 2016-11-24 — End: 2016-11-24
  Administered 2016-11-24: 100 mg via INTRAVENOUS

## 2016-11-24 MED ORDER — HYDROMORPHONE HCL 1 MG/ML IJ SOLN
0.5000 mg | INTRAMUSCULAR | Status: DC | PRN
  Administered 2016-11-24: 1 mg via INTRAVENOUS
  Filled 2016-11-24: qty 1

## 2016-11-24 MED ORDER — POLYETHYLENE GLYCOL 3350 17 G PO PACK: 17.0000 g | PACK | Freq: Every day | ORAL | Status: DC | PRN

## 2016-11-24 MED ORDER — VITAMIN D 50 MCG (2000 UT) PO CAPS: 2000.0000 [IU] | ORAL_CAPSULE | Freq: Every day | ORAL | Status: DC

## 2016-11-24 MED ORDER — OXYCODONE-ACETAMINOPHEN 5-325 MG PO TABS: 1.0000 | ORAL_TABLET | ORAL | Status: DC | PRN

## 2016-11-24 MED ORDER — AMLODIPINE BESYLATE 5 MG PO TABS
5.0000 mg | ORAL_TABLET | Freq: Every day | ORAL | Status: DC
  Administered 2016-11-24: 5 mg via ORAL
  Filled 2016-11-24: qty 1

## 2016-11-24 MED ORDER — ONDANSETRON HCL 4 MG/2ML IJ SOLN: 4.0000 mg | Freq: Four times a day (QID) | INTRAMUSCULAR | Status: DC | PRN

## 2016-11-24 MED ORDER — MIDAZOLAM HCL 2 MG/2ML IJ SOLN
INTRAMUSCULAR | Status: AC
  Filled 2016-11-24: qty 2

## 2016-11-24 MED ORDER — FENTANYL CITRATE (PF) 250 MCG/5ML IJ SOLN
INTRAMUSCULAR | Status: AC
  Filled 2016-11-24: qty 5

## 2016-11-24 MED ORDER — METHYLENE BLUE 0.5 % INJ SOLN
INTRAVENOUS | Status: AC
  Filled 2016-11-24: qty 10

## 2016-11-24 MED ORDER — CHLORHEXIDINE GLUCONATE 4 % EX LIQD
60.0000 mL | Freq: Once | CUTANEOUS | Status: DC
Start: 2016-11-25 — End: 2016-11-24

## 2016-11-24 MED ORDER — SUCCINYLCHOLINE CHLORIDE 200 MG/10ML IV SOSY
PREFILLED_SYRINGE | INTRAVENOUS | Status: AC
  Filled 2016-11-24: qty 10

## 2016-11-24 MED ORDER — POVIDONE-IODINE 7.5 % EX SOLN
Freq: Once | CUTANEOUS | Status: DC
  Filled 2016-11-24: qty 118

## 2016-11-24 MED ORDER — DIAZEPAM 5 MG PO TABS
5.0000 mg | ORAL_TABLET | Freq: Four times a day (QID) | ORAL | Status: DC | PRN
  Administered 2016-11-24: 5 mg via ORAL
  Filled 2016-11-24: qty 1

## 2016-11-24 MED ORDER — SODIUM CHLORIDE 0.9% FLUSH
3.0000 mL | Freq: Two times a day (BID) | INTRAVENOUS | Status: DC
  Administered 2016-11-24: 3 mL via INTRAVENOUS

## 2016-11-24 MED ORDER — ROCURONIUM BROMIDE 100 MG/10ML IV SOLN
INTRAVENOUS | Status: DC | PRN
  Administered 2016-11-24: 50 mg via INTRAVENOUS
  Administered 2016-11-24: 20 mg via INTRAVENOUS
  Administered 2016-11-24: 30 mg via INTRAVENOUS
  Administered 2016-11-24: 20 mg via INTRAVENOUS

## 2016-11-24 MED ORDER — ONDANSETRON HCL 4 MG PO TABS: 4.0000 mg | ORAL_TABLET | Freq: Four times a day (QID) | ORAL | Status: DC | PRN

## 2016-11-24 MED ORDER — FAMOTIDINE 20 MG PO TABS
20.0000 mg | ORAL_TABLET | Freq: Every day | ORAL | Status: DC
  Administered 2016-11-24 – 2016-11-25 (×2): 20 mg via ORAL
  Filled 2016-11-24 (×2): qty 1

## 2016-11-24 MED ORDER — GLYCOPYRROLATE 0.2 MG/ML IV SOSY
PREFILLED_SYRINGE | INTRAVENOUS | Status: DC | PRN
  Administered 2016-11-24: .2 mg via INTRAVENOUS

## 2016-11-24 MED ORDER — LACTATED RINGERS IV SOLN
INTRAVENOUS | Status: DC | PRN
  Administered 2016-11-24 (×3): via INTRAVENOUS

## 2016-11-24 MED ORDER — THROMBIN 20000 UNITS EX KIT
PACK | CUTANEOUS | Status: DC | PRN
  Administered 2016-11-24: 20000 [IU] via TOPICAL

## 2016-11-24 MED ORDER — PROPOFOL 500 MG/50ML IV EMUL
INTRAVENOUS | Status: DC | PRN
  Administered 2016-11-24: 50 ug/kg/min via INTRAVENOUS

## 2016-11-24 MED ORDER — ACETAMINOPHEN 325 MG PO TABS: 650.0000 mg | ORAL_TABLET | ORAL | Status: DC | PRN

## 2016-11-24 MED ORDER — PHENYLEPHRINE HCL 10 MG/ML IJ SOLN
INTRAVENOUS | Status: DC | PRN
  Administered 2016-11-24: 40 ug/min via INTRAVENOUS

## 2016-11-24 MED FILL — Heparin Sodium (Porcine) Inj 1000 Unit/ML: INTRAMUSCULAR | Qty: 30 | Status: AC

## 2016-11-24 MED FILL — Sodium Chloride IV Soln 0.9%: INTRAVENOUS | Qty: 1000 | Status: AC

## 2016-11-24 SURGICAL SUPPLY — 140 items
ADH SKN CLS APL DERMABOND .7 (GAUZE/BANDAGES/DRESSINGS) ×2
APL SKNCLS STERI-STRIP NONHPOA (GAUZE/BANDAGES/DRESSINGS) ×4
APPLIER CLIP 11 MED OPEN (CLIP) ×4
APR CLP MED 11 20 MLT OPN (CLIP) ×2
BENZOIN TINCTURE PRP APPL 2/3 (GAUZE/BANDAGES/DRESSINGS) ×6 IMPLANT
BLADE CLIPPER SURG (BLADE) ×2 IMPLANT
BLADE SURG 10 STRL SS (BLADE) ×4 IMPLANT
BONE VIVIGEN FORMABLE 10CC (Bone Implant) ×4 IMPLANT
BUR PRESCISION 1.7 ELITE (BURR) ×4 IMPLANT
BUR ROUND PRECISION 4.0 (BURR) IMPLANT
BUR ROUND PRECISION 4.0MM (BURR)
BUR SABER RD CUTTING 3.0 (BURR) IMPLANT
BUR SABER RD CUTTING 3.0MM (BURR)
CAGE COUGAR LG 14 10 (Cage) ×1 IMPLANT
CAGE COUGAR LG 14MM 10 (Cage) ×1 IMPLANT
CARTRIDGE OIL MAESTRO DRILL (MISCELLANEOUS) ×2 IMPLANT
CLIP APPLIE 11 MED OPEN (CLIP) ×2 IMPLANT
CLIP LIGATING EXTRA MED SLVR (CLIP) ×4 IMPLANT
CLIP LIGATING EXTRA SM BLUE (MISCELLANEOUS) ×4 IMPLANT
CLOSURE WOUND 1/2 X4 (GAUZE/BANDAGES/DRESSINGS) ×2
CONT SPEC 4OZ CLIKSEAL STRL BL (MISCELLANEOUS) ×4 IMPLANT
CORDS BIPOLAR (ELECTRODE) ×4 IMPLANT
COUNTER NEEDLE 20 DBL MAG RED (NEEDLE) ×2 IMPLANT
COVER MAYO STAND STRL (DRAPES) ×8 IMPLANT
COVER SURGICAL LIGHT HANDLE (MISCELLANEOUS) ×6 IMPLANT
DERMABOND ADVANCED (GAUZE/BANDAGES/DRESSINGS) ×2
DERMABOND ADVANCED .7 DNX12 (GAUZE/BANDAGES/DRESSINGS) ×2 IMPLANT
DIFFUSER DRILL AIR PNEUMATIC (MISCELLANEOUS) ×4 IMPLANT
DRAIN CHANNEL 15F RND FF W/TCR (WOUND CARE) IMPLANT
DRAPE C-ARM 42X72 X-RAY (DRAPES) ×10 IMPLANT
DRAPE C-ARMOR (DRAPES) ×2 IMPLANT
DRAPE INCISE IOBAN 66X45 STRL (DRAPES) ×2 IMPLANT
DRAPE POUCH INSTRU U-SHP 10X18 (DRAPES) ×4 IMPLANT
DRAPE SURG 17X23 STRL (DRAPES) ×16 IMPLANT
DRSG MEPILEX BORDER 4X12 (GAUZE/BANDAGES/DRESSINGS) ×4 IMPLANT
DRSG MEPILEX BORDER 4X8 (GAUZE/BANDAGES/DRESSINGS) ×2 IMPLANT
DURAPREP 26ML APPLICATOR (WOUND CARE) ×4 IMPLANT
ELECT BLADE 4.0 EZ CLEAN MEGAD (MISCELLANEOUS) ×8
ELECT CAUTERY BLADE 6.4 (BLADE) ×6 IMPLANT
ELECT REM PT RETURN 9FT ADLT (ELECTROSURGICAL) ×4
ELECTRODE BLDE 4.0 EZ CLN MEGD (MISCELLANEOUS) ×4 IMPLANT
ELECTRODE REM PT RTRN 9FT ADLT (ELECTROSURGICAL) ×2 IMPLANT
EVACUATOR SILICONE 100CC (DRAIN) IMPLANT
FEE INTRAOP MONITOR IMPULS NCS (MISCELLANEOUS) IMPLANT
GAUZE SPONGE 4X4 12PLY STRL (GAUZE/BANDAGES/DRESSINGS) ×4 IMPLANT
GAUZE SPONGE 4X4 16PLY XRAY LF (GAUZE/BANDAGES/DRESSINGS) ×4 IMPLANT
GLOVE BIO SURGEON STRL SZ7 (GLOVE) ×4 IMPLANT
GLOVE BIO SURGEON STRL SZ8 (GLOVE) ×4 IMPLANT
GLOVE BIOGEL PI IND STRL 7.0 (GLOVE) ×2 IMPLANT
GLOVE BIOGEL PI IND STRL 8 (GLOVE) ×4 IMPLANT
GLOVE BIOGEL PI INDICATOR 7.0 (GLOVE) ×2
GLOVE BIOGEL PI INDICATOR 8 (GLOVE) ×4
GLOVE SS BIOGEL STRL SZ 7.5 (GLOVE) ×2 IMPLANT
GLOVE SUPERSENSE BIOGEL SZ 7.5 (GLOVE) ×2
GOWN STRL REUS W/ TWL LRG LVL3 (GOWN DISPOSABLE) ×6 IMPLANT
GOWN STRL REUS W/ TWL XL LVL3 (GOWN DISPOSABLE) ×2 IMPLANT
GOWN STRL REUS W/TWL LRG LVL3 (GOWN DISPOSABLE) ×12
GOWN STRL REUS W/TWL XL LVL3 (GOWN DISPOSABLE) ×4
GRAFT BNE MATRIX VG FRMBL L 10 (Bone Implant) IMPLANT
GUIDEWIRE BLUNT VIPER II 1.45 (WIRE) ×2 IMPLANT
GUIDEWIRE SHARP VIPER II (WIRE) ×8 IMPLANT
HEMOSTAT SURGICEL 2X14 (HEMOSTASIS) IMPLANT
INSERT FOGARTY 61MM (MISCELLANEOUS) IMPLANT
INSERT FOGARTY SM (MISCELLANEOUS) IMPLANT
INTRAOP MONITOR FEE IMPULS NCS (MISCELLANEOUS) ×2
INTRAOP MONITOR FEE IMPULSE (MISCELLANEOUS) ×2
IV CATH 14GX2 1/4 (CATHETERS) ×4 IMPLANT
KIT BASIN OR (CUSTOM PROCEDURE TRAY) ×4 IMPLANT
KIT MAXAN 1 LEVEL (KITS) ×2 IMPLANT
KIT POSITION SURG JACKSON T1 (MISCELLANEOUS) ×4 IMPLANT
KIT ROOM TURNOVER OR (KITS) ×4 IMPLANT
LOOP VESSEL MAXI BLUE (MISCELLANEOUS) IMPLANT
LOOP VESSEL MINI RED (MISCELLANEOUS) IMPLANT
MARKER SKIN DUAL TIP RULER LAB (MISCELLANEOUS) ×4 IMPLANT
NDL HYPO 25GX1X1/2 BEV (NEEDLE) ×2 IMPLANT
NDL JAMSHIDI VIPER (NEEDLE) IMPLANT
NDL SAFETY ECLIPSE 18X1.5 (NEEDLE) ×2 IMPLANT
NDL SPNL 18GX3.5 QUINCKE PK (NEEDLE) ×4 IMPLANT
NEEDLE 22X1 1/2 (OR ONLY) (NEEDLE) ×4 IMPLANT
NEEDLE HYPO 18GX1.5 SHARP (NEEDLE) ×4
NEEDLE HYPO 25GX1X1/2 BEV (NEEDLE) ×4 IMPLANT
NEEDLE JAMSHIDI VIPER (NEEDLE) ×8 IMPLANT
NEEDLE SPNL 18GX3.5 QUINCKE PK (NEEDLE) ×8 IMPLANT
NS IRRIG 1000ML POUR BTL (IV SOLUTION) ×8 IMPLANT
OIL CARTRIDGE MAESTRO DRILL (MISCELLANEOUS) ×4
PACK LAMINECTOMY ORTHO (CUSTOM PROCEDURE TRAY) ×4 IMPLANT
PACK UNIVERSAL I (CUSTOM PROCEDURE TRAY) ×4 IMPLANT
PAD ARMBOARD 7.5X6 YLW CONV (MISCELLANEOUS) ×16 IMPLANT
PATTIES SURGICAL .5 X1 (DISPOSABLE) ×4 IMPLANT
PATTIES SURGICAL .5X1.5 (GAUZE/BANDAGES/DRESSINGS) ×4 IMPLANT
PENCIL BUTTON HOLSTER BLD 10FT (ELECTRODE) ×2 IMPLANT
PROBE NEUROSIGN BIPOLAR (INSTRUMENTS) ×2 IMPLANT
PROBE PEDCLE PROBE MAGSTM DISP (MISCELLANEOUS) ×2 IMPLANT
ROD VIPER II LORDOSED 5.5X40 (Rod) ×4 IMPLANT
SCREW SET SINGLE INNER MIS (Screw) ×8 IMPLANT
SPONGE INTESTINAL PEANUT (DISPOSABLE) ×12 IMPLANT
SPONGE LAP 18X18 X RAY DECT (DISPOSABLE) IMPLANT
SPONGE LAP 4X18 X RAY DECT (DISPOSABLE) IMPLANT
SPONGE SURGIFOAM ABS GEL 100 (HEMOSTASIS) ×8 IMPLANT
STAPLER VISISTAT 35W (STAPLE) IMPLANT
STRIP CLOSURE SKIN 1/2X4 (GAUZE/BANDAGES/DRESSINGS) ×6 IMPLANT
SURGIFLO W/THROMBIN 8M KIT (HEMOSTASIS) IMPLANT
SUT MNCRL AB 3-0 PS2 18 (SUTURE) ×2 IMPLANT
SUT MNCRL AB 4-0 PS2 18 (SUTURE) ×6 IMPLANT
SUT PDS AB 1 CTX 36 (SUTURE) ×8 IMPLANT
SUT PROLENE 4 0 RB 1 (SUTURE)
SUT PROLENE 4-0 RB1 .5 CRCL 36 (SUTURE) IMPLANT
SUT PROLENE 5 0 C 1 24 (SUTURE) IMPLANT
SUT PROLENE 5 0 CC1 (SUTURE) IMPLANT
SUT PROLENE 6 0 C 1 30 (SUTURE) ×4 IMPLANT
SUT PROLENE 6 0 CC (SUTURE) IMPLANT
SUT SILK 0 TIES 10X30 (SUTURE) ×4 IMPLANT
SUT SILK 2 0 TIES 10X30 (SUTURE) ×8 IMPLANT
SUT SILK 2 0SH CR/8 30 (SUTURE) IMPLANT
SUT SILK 3 0 TIES 10X30 (SUTURE) ×8 IMPLANT
SUT SILK 3 0SH CR/8 30 (SUTURE) IMPLANT
SUT VIC AB 0 CT1 18XCR BRD 8 (SUTURE) ×2 IMPLANT
SUT VIC AB 0 CT1 8-18 (SUTURE) ×8
SUT VIC AB 1 CT1 18XCR BRD 8 (SUTURE) ×2 IMPLANT
SUT VIC AB 1 CT1 27 (SUTURE) ×8
SUT VIC AB 1 CT1 27XBRD ANBCTR (SUTURE) ×4 IMPLANT
SUT VIC AB 1 CT1 8-18 (SUTURE) ×8
SUT VIC AB 1 CTX 36 (SUTURE) ×8
SUT VIC AB 1 CTX36XBRD ANBCTR (SUTURE) ×4 IMPLANT
SUT VIC AB 2-0 CT2 18 VCP726D (SUTURE) ×10 IMPLANT
SYR 20CC LL (SYRINGE) ×4 IMPLANT
SYR BULB IRRIGATION 50ML (SYRINGE) ×4 IMPLANT
SYR CONTROL 10ML LL (SYRINGE) ×10 IMPLANT
SYR TB 1ML LUER SLIP (SYRINGE) ×4 IMPLANT
TABS EXTENSION VIPER 6X35 (Neuro Prosthesis/Implant) IMPLANT
TAP CANN VIPER2 DL 5.0 (TAP) ×4 IMPLANT
TAPE CLOTH SURG 4X10 WHT LF (GAUZE/BANDAGES/DRESSINGS) ×2 IMPLANT
TAPE CLOTH SURG 6X10 WHT LF (GAUZE/BANDAGES/DRESSINGS) ×2 IMPLANT
TOWEL OR 17X24 6PK STRL BLUE (TOWEL DISPOSABLE) ×4 IMPLANT
TOWEL OR 17X26 10 PK STRL BLUE (TOWEL DISPOSABLE) ×4 IMPLANT
TRAY FOLEY CATH SILVER 16FR (SET/KITS/TRAYS/PACK) ×4 IMPLANT
TRAY FOLEY W/METER SILVER 16FR (SET/KITS/TRAYS/PACK) ×4 IMPLANT
WATER STERILE IRR 1000ML POUR (IV SOLUTION) ×4 IMPLANT
X-TABS VIPER 6X35 (Neuro Prosthesis/Implant) ×16 IMPLANT
YANKAUER SUCT BULB TIP NO VENT (SUCTIONS) ×6 IMPLANT

## 2016-11-24 NOTE — Op Note (Signed)
    OPERATIVE REPORT  DATE OF SURGERY: 11/24/2016  PATIENT: [name], 66 y.o. female MRN: [id number]  DOB: [birth year]  PRE-OPERATIVE DIAGNOSIS: Degenerative disc disease L5-S1  POST-OPERATIVE DIAGNOSIS:  Same  PROCEDURE: Anterior exposure for L5-S1 disc surgery  SURGEON:  Curt Jews, M.D.  Co-surgeon for the exposure Dr. Burt Knack  ANESTHESIA:  Gen.  EBL: 100 ml  Total I/O In: 3322.5 [I.V.:3322.5] Out: 850 [Urine:750; Blood:100]  BLOOD ADMINISTERED: None  DRAINS: None  SPECIMEN: None  COUNTS CORRECT:  YES  PLAN OF CARE: PACU   PATIENT DISPOSITION:  PACU - hemodynamically stable  PROCEDURE DETAILS: The patient was taken to the operating placed supine position where the area of the abdomen was prepped and draped in usual sterile fashion. Crosstable lateral imaging revealed the level of the L5-S1 disc and this was marked on the surface of the skin. Was made from the midline to the left several finger breaths below the umbilicus. The patient had prior midline incision and also a low Pfannenstiel incision. The rectus muscle was mobilized and the retroperitoneum was entered lateral to the rectus in the left lower quadrant. Intra-abdominal contents were reflected to the right. Dissection was continued above the level of the iliac vessels. The left ureter was identified and was mobilized to the right. Blunt dissection over the L5-S1 disc was continued to give adequate exposure to the right and left of the L5-S1 disc. Blunt dissection superiorly and inferiorly also gave adequate exposure. Once full mobilization was obtained the [facility] retractor was brought onto the field reverse lip 150 blades were positioned to the right and left of the L5-S1 disc. Malleable 140 blades were positioned superior and inferiorly for exposure. C-arm was brought back onto the field and a needle was placed in the disc space. This confirmed that this was the L5-S1 disc. The remainder the procedure  will be dictated as a separate note by Dr. Levora Dredge, M.D., [facility] 11/24/2016 2:13 PM

## 2016-11-24 NOTE — Anesthesia Procedure Notes (Signed)
Procedure Name: Intubation Date/Time: 11/24/2016 7:44 AM Performed by: Carney Living Pre-anesthesia Checklist: Patient identified, Emergency Drugs available, Suction available, Patient being monitored and Timeout performed Patient Re-evaluated:Patient Re-evaluated prior to inductionOxygen Delivery Method: Circle system utilized Preoxygenation: Pre-oxygenation with 100% oxygen Intubation Type: IV induction Ventilation: Mask ventilation without difficulty Laryngoscope Size: Mac and 4 Grade View: Grade I Tube type: Oral Tube size: 7.0 mm Number of attempts: 1 Airway Equipment and Method: Stylet Placement Confirmation: ETT inserted through vocal cords under direct vision,  positive ETCO2 and breath sounds checked- equal and bilateral Secured at: 21 cm Tube secured with: Tape Dental Injury: Teeth and Oropharynx as per pre-operative assessment

## 2016-11-24 NOTE — Transfer of Care (Signed)
Immediate Anesthesia Transfer of Care Note  Patient: [name]  Procedure(s) Performed: Procedure(s) with comments: LUMBAR 5-SACRUM 1 ANTERIOR LUMBAR INTERBODY FUSION (Bilateral) - LUMBAR 5-SACRUM 1 ANTERIOR LUMBAR INTERBODY FUSION  LUMBAR 5-SACRUM 1 POSTERIOR SPINAL FUSION WITH INSTRUMENTATION AND ALLOGRAFT (Bilateral) - LUMBAR 1-SACRUM 5POSTERIOR SPINAL FUSION WITH INSTRUMENTATION AND ALLOGRAFT ABDOMINAL EXPOSURE (N/A)  Patient Location: PACU  Anesthesia Type:General  Level of Consciousness: awake, alert , oriented and patient cooperative  Airway & Oxygen Therapy: Patient Spontanous Breathing and Patient connected to nasal cannula oxygen  Post-op Assessment: Report given to RN, Post -op Vital signs reviewed and stable and Patient moving all extremities X 4  Post vital signs: Reviewed and stable  Last Vitals:  Vitals:   11/24/16 0557 11/24/16 1215  BP: 130/73   Pulse: 78   Resp: 18   Temp:  36.1 C    Last Pain:  Vitals:   11/24/16 0557  PainSc: 6          Complications: No apparent anesthesia complications

## 2016-11-24 NOTE — OR Nursing (Signed)
Radiology verbal report to room at 1018:  No unexpected foreign bodies present; per Dr. Jacqualyn Posey.

## 2016-11-24 NOTE — Op Note (Signed)
NAME:  [name], [name]                    ACCOUNT NO.:  MEDICAL RECORD NO.:  [id number]  LOCATION:                                 FACILITY:  PHYSICIAN:  Phylliss Bob, MD           DATE OF BIRTH:  DATE OF PROCEDURE:  11/24/2016                              OPERATIVE REPORT   PREOPERATIVE DIAGNOSIS: 1. Bilateral lumbar radiculopathy. 2. Bilateral neural foraminal stenosis, L5-S1. 3. L5-S1 retrolisthesis. 4. L5-S1 degenerative disk disease.  POSTOPERATIVE DIAGNOSIS: 1. Bilateral lumbar radiculopathy. 2. Bilateral neural foraminal stenosis, L5-S1. 3. L5-S1 retrolisthesis. 4. L5-S1 degenerative disk disease.  PROCEDURE: 1. Anterior lumbar interbody fusion, L5-S1. 2. Insertion of interbody device x1 (14 mm, 10 degrees of lordosis,     small, intervertebral spacer). 3. Placement of anterior instrumentation, L5-S1. 4. Intraoperative use of fluoroscopy. 5. Posterior spinal fusion, L5-S1. 6. Placement of the posterior instrumentation, L5, S1. 7. Use of allograft (Vivagen) 8. CO-SURGEON with Dr. Sherren Mocha Early for the retroperitoneal exposure.  SURGEON:  Phylliss Bob, MD  ASSISTANT:  Pricilla Holm, PAC.  ANESTHESIA:  General endotracheal anesthesia.  COMPLICATIONS:  None.  DISPOSITION:  Stable.  ESTIMATED BLOOD LOSS:  Minimal.  INDICATIONS FOR SURGERY:  Briefly, [name] is a pleasant 66 year old female who did present to me with ongoing chronic right greater than left leg pain.  The patient's MRI and x-rays did reveal the findings outlined above.  Given the patient's ongoing pain and dysfunction, we did discuss proceeding with the procedure reflected above.  The patient was fully aware of the risks and limitations of surgery and did elect to proceed.  OPERATIVE DETAILS:  On November 24, 2016, the patient was brought to surgery and general endotracheal anesthesia was administered.  The patient was placed supine on the hospital bed.  All bony prominences were padded.  The  abdomen was prepped and draped in the usual sterile fashion.  A time-out was performed.  A retroperitoneal approach was then performed by Dr. Curt Jews, and I did function as his first assistant for the approach.  Once the L5-S1 intervertebral space was identified, I did perform a thorough and complete L5-S1 intervertebral diskectomy, to the posterior annulus.  The endplates were then appropriately prepared. I then placed a series of trials and the appropriate size intervertebral spacer was liberally packed with ViviGen and tamped into position in the usual fashion.  I was very pleased with the restoration of disk height. I then proceeded with anterior instrumentation.  I did cannulate the upper anterior aspect of the S1 vertebral body.  A screw was placed with an anterior fixation device, in order to secure the intervertebral spacer into the appropriate position.  An excellent press fit of the spacer and fixation device was noted.  The wound was then irrigated.  I was very pleased with the final AP and lateral fluoroscopic images.  The wound was then closed.  The fascia was closed using 1 PDS and the skin was closed using 2-0 Vicryl followed by 4-0 Monocryl.  Benzoin and Steri- Strips were applied followed by sterile dressing.  Of note, Pricilla Holm, PAC, did function as my first assistant  for the diskectomy and fusion and anterior instrumentation.  The patient was then rolled prone onto a spinal frame.  All bony prominences were padded.  The back was prepped and draped in the usual sterile fashion.  Using fluoroscopy, I did Stanislav Gervase out the L5 and S1 pedicles.  The L5-S1 pedicles were then cannulated in the usual fashion, first using a Jamshidi needle followed by 5 mm tap.  The tap was tested and there was no tap that tested below 20 milliamps.  On the patient's left side, the L5-S1 facet joint was decorticated and additional ViviGen was then packed into the posterolateral gutter and  along the region of the facet joint to help aid in the fusion.  I then placed 6 x 35 mm screws bilaterally at L5 and S1.  A 40 mm rod was then used to connect the tulip heads of the screws bilaterally.  Caps were then placed and a final locking procedure was performed.  I was very pleased with the final AP and lateral fluoroscopic images.  The wounds were then copiously irrigated.  The fascia was closed using #1 Vicryl and the skin was closed using 2-0 Vicryl followed by 4-0 Monocryl bilaterally. Benzoin and Steri-Strips were applied followed by sterile dressing. There was no abnormal EMG activity noted throughout the entire surgery.  Of note, Pricilla Holm was my assistant for both the anterior and posterior portions of the procedure, and did aid in retraction, suctioning, and closure.     Phylliss Bob, MD     MD/MEDQ  D:  11/24/2016  T:  11/24/2016  Job:  [id number]

## 2016-11-24 NOTE — H&P (Signed)
PREOPERATIVE H&P  Chief Complaint: R > L leg pain  HPI: [name] is a 66 y.o. female who presents with ongoing pain in the bilateral legs  MRI reveals NF stenosis b/l at L5/S1 and a retrolisthesis and DDD is also noted at L5/S1  Patient has failed multiple forms of conservative care and continues to have pain (see office notes for additional details regarding the patient's full course of treatment)  Past Medical History:  Diagnosis Date  . Alcohol abuse, in remission   . Cancer of sigmoid colon ([place]) 07/2005   T2N0M0  . Chronic bronchitis   . Depression   . Dyspnea   . Gastritis   . GERD (gastroesophageal reflux disease)   . History of hiatal hernia   . Hyperlipidemia   . Hypertension   . Hypothyroid    no meds  . Migraines   . OA (osteoarthritis)   . PONV (postoperative nausea and vomiting)   . Reflux    Past Surgical History:  Procedure Laterality Date  . ABDOMINAL HYSTERECTOMY    . APPENDECTOMY    . CHOLECYSTECTOMY    . COLON RESECTION    . COLONOSCOPY    . HAND SURGERY    . repair of incisional hernia    . SHOULDER SURGERY     both shoulders  . TONSILLECTOMY     Social History   Social History  . Marital status: Single    Spouse name: N/A  . Number of children: N/A  . Years of education: N/A   Social History Main Topics  . Smoking status: Current Every Day Smoker    Packs/day: 1.50    Types: Cigarettes  . Smokeless tobacco: Never Used  . Alcohol use No     Comment: recovering  alcoholic  . Drug use: No  . Sexual activity: Not on file   Other Topics Concern  . Not on file   Social History Narrative  . No narrative on file   Family History  Problem Relation Age of Onset  . Heart disease Mother     Previous MI at age 63-55  . Atrial fibrillation Mother   . Stroke Mother   . Diabetes Sister   . Cancer Father 84    Prostate cancer   . Cancer Maternal Aunt     lung cancer   . Cancer Maternal Uncle     kidney ca   Allergies   Allergen Reactions  . Benadryl [Diphenhydramine Hcl] Nausea And Vomiting and Other (See Comments)    Migraine headaches.  . Lansoprazole Nausea And Vomiting and Other (See Comments)    Migraine headache  . Morphine Sulfate Nausea And Vomiting and Other (See Comments)    Migraine headaches.  . Penicillins Nausea And Vomiting and Other (See Comments)    INTOLERANCE >  NAUSEA & VOMITING YEAST INFECTION > CHANGE IN NORMAL FLORA  Has patient had a PCN reaction causing immediate rash, facial/tongue/throat swelling, SOB or lightheadedness with hypotension:No Has patient had a PCN reaction causing severe rash involving mucus membranes or skin necrosis: No Has patient had a PCN reaction that required hospitalization No Has patient had a PCN reaction occurring within the last 10 years:No   . Red Dye Other (See Comments)    Also allergic to [facility] Dye-migraine headaches.  . Simvastatin Nausea And Vomiting and Other (See Comments)    Migraine   . Codeine Nausea And Vomiting  . Shellfish Allergy Other (See Comments)  Congestion  . Sulfa Antibiotics Nausea And Vomiting   Prior to Admission medications   Medication Sig Start Date End Date Taking? Authorizing Provider  amitriptyline (ELAVIL) 25 MG tablet Take 100 mg by mouth at bedtime.     Yes Historical Provider, MD  amLODipine (NORVASC) 5 MG tablet Take 5 mg by mouth at bedtime. 09/04/16  Yes Historical Provider, MD  Aspirin-Salicylamide-Caffeine (BC FAST PAIN RELIEF) 650-195-33.3 MG PACK Take 1 packet by mouth every 8 (eight) hours as needed (for migraine headaches.).   Yes Historical Provider, MD  calcium carbonate (TUMS - DOSED IN MG ELEMENTAL CALCIUM) 500 MG chewable tablet Chew 2 tablets by mouth 4 (four) times daily as needed for indigestion or heartburn.   Yes Historical Provider, MD  Cholecalciferol (VITAMIN D) 2000 units CAPS Take 2,000 Units by mouth at bedtime.   Yes Historical Provider, MD  Cyanocobalamin (VITAMIN B-12) 5000 MCG TBDP  Take 5,000 mcg by mouth at bedtime.   Yes Historical Provider, MD  gabapentin (NEURONTIN) 300 MG capsule Take 300 mg by mouth See admin instructions. PATIENT TYPICALLY TAKES TWICE DAILY--BUT WILL TAKE THE 3RD DOSE OCCASIONALLY 08/23/16  Yes Historical Provider, MD  ranitidine (ZANTAC) 150 MG tablet Take 150 mg by mouth daily as needed (for acid reflux/heartburn).    Yes Historical Provider, MD  vitamin E 1000 UNIT capsule Take 1,000 Units by mouth at bedtime.   Yes Historical Provider, MD     All other systems have been reviewed and were otherwise negative with the exception of those mentioned in the HPI and as above.  Physical Exam: Vitals:   11/24/16 0557  BP: 130/73  Pulse: 78  Resp: 18    General: Alert, no acute distress Cardiovascular: No pedal edema Respiratory: No cyanosis, no use of accessory musculature Skin: No lesions in the area of chief complaint Neurologic: Sensation intact distally Psychiatric: Patient is competent for consent with normal mood and affect Lymphatic: No axillary or cervical lymphadenopathy  Assessment/Plan: Bilateral leg pain Plan for Procedure(s): LUMBAR 5-SACRUM 1 ANTERIOR LUMBAR INTERBODY FUSION LUMBAR 1-SACRUM 5POSTERIOR SPINAL FUSION WITH INSTRUMENTATION AND ALLOGRAFT   [name], MD 11/24/2016 6:58 AM

## 2016-11-25 ENCOUNTER — Encounter (HOSPITAL_COMMUNITY): Payer: Self-pay | Admitting: Orthopedic Surgery

## 2016-11-25 ENCOUNTER — Inpatient Hospital Stay (HOSPITAL_COMMUNITY): Admission: RE | Admit: 2016-11-25 | Payer: Medicare Other | Source: Ambulatory Visit | Admitting: Orthopedic Surgery

## 2016-11-25 ENCOUNTER — Inpatient Hospital Stay (HOSPITAL_COMMUNITY): Admission: RE | Payer: Self-pay | Source: Ambulatory Visit

## 2016-11-25 SURGERY — POSTERIOR LUMBAR FUSION 1 LEVEL
Anesthesia: General

## 2016-11-25 MED ORDER — HYDROMORPHONE HCL 2 MG PO TABS: 1.0000 mg | ORAL_TABLET | ORAL | 0 refills | Status: DC | PRN

## 2016-11-25 MED FILL — Thrombin For Soln 20000 Unit: CUTANEOUS | Qty: 1 | Status: AC

## 2016-11-25 NOTE — Progress Notes (Signed)
Patient ID: [name], female   DOB: 1950/11/10, 66 y.o.   MRN: [id number] Looks great this morning. Has been up walking in the hall several times. Comfortable with her back brace in place. No nausea or vomiting. Minimal abdominal soreness. Reports that her leg pain is completely resolved.  Outstanding Venson Ferencz result. Will not follow actively. Please call if we can assist.

## 2016-11-25 NOTE — Evaluation (Signed)
Occupational Therapy Evaluation/Discharge  Patient Details Name: [name] MRN: [id number] DOB: 1950/12/25 Today's Date: 11/25/2016    History of Present Illness Pt is a 66 y/o female who presents s/p L5-S1 ALIF on 11/24/16.   Clinical Impression   Pt reports she was independent with ADL PTA. Currently pt overall supervision for ADL and functional mobility with the exception of min assist for LB dressing. All back, safety, and ADL education completed. Pt planning to d/c home with 24/7 supervision from a friend. No further acute OT needs identified; signing off at this time. Please re-consult if needs change. Thank you for this referral.     Follow Up Recommendations  No OT follow up;Supervision - Intermittent    Equipment Recommendations  None recommended by OT    Recommendations for Other Services       Precautions / Restrictions Precautions Precautions: Fall;Back Precaution Booklet Issued: No Precaution Comments: Reviewed back precautions with pt Required Braces or Orthoses: Spinal Brace Spinal Brace: Thoracolumbosacral orthotic;Applied in sitting position Restrictions Weight Bearing Restrictions: No      Mobility Bed Mobility Overal bed mobility: Needs Assistance Bed Mobility: Rolling;Sidelying to Sit;Sit to Sidelying Rolling: Supervision Sidelying to sit: Min guard     Sit to sidelying: Min guard General bed mobility comments: Pt sitting EOB upon arrival  Transfers Overall transfer level: Needs assistance Equipment used: None Transfers: Sit to/from Stand Sit to Stand: Supervision         General transfer comment: Supervision for safety; no physical assist    Balance Overall balance assessment: No apparent balance deficits (not formally assessed)                                         ADL either performed or assessed with clinical judgement   ADL Overall ADL's : Needs assistance/impaired Eating/Feeding: Set up;Sitting    Grooming: Supervision/safety;Standing Grooming Details (indicate cue type and reason): Educated on use of 2 cups for oral care Upper Body Bathing: Set up;Supervision/ safety;Sitting   Lower Body Bathing: Set up;Supervison/ safety;Sit to/from stand   Upper Body Dressing : Set up;Supervision/safety;Sitting Upper Body Dressing Details (indicate cue type and reason): pt reports no difficulties donning brace; declines to practice Lower Body Dressing: Minimal assistance;Sit to/from stand Lower Body Dressing Details (indicate cue type and reason): Pt unable to cross foot over opposite knee. Friend to assist as needed Toilet Transfer: Supervision/safety;Ambulation;Comfort height toilet Toilet Transfer Details (indicate cue type and reason): Simulated by sit to stand from EOB with functional mobility in room   Toileting - Clothing Manipulation Details (indicate cue type and reason): Educated on proper technique for peri care without twisting and use of wet wipes Tub/ Shower Transfer: Supervision/safety;Walk-in shower;Ambulation;Shower Scientist, research (medical) Details (indicate cue type and reason): Educated on use of shower seat for safety with bathing Functional mobility during ADLs: Supervision/safety General ADL Comments: Educated pt on maintaining back precautions during functional activities, keeping frequently used items at counter top height, frequent mobility throghout the day upon return home.     Vision         Perception     Praxis      Pertinent Vitals/Pain Pain Assessment: Faces Faces Pain Scale: Hurts a little bit Pain Location: back Pain Descriptors / Indicators: Sore Pain Intervention(s): Monitored during session;Repositioned     Hand Dominance     Extremity/Trunk Assessment Upper Extremity Assessment Upper Extremity Assessment:  Overall WFL for tasks assessed   Lower Extremity Assessment Lower Extremity Assessment: Defer to PT evaluation   Cervical / Trunk  Assessment Cervical / Trunk Assessment: Other exceptions Cervical / Trunk Exceptions: forward head/rounded shoulders; s/p surgery   Communication Communication Communication: No difficulties   Cognition Arousal/Alertness: Awake/alert Behavior During Therapy: WFL for tasks assessed/performed Overall Cognitive Status: Within Functional Limits for tasks assessed                                     General Comments       Exercises     Shoulder Instructions      Home Living Family/patient expects to be discharged to:: Private residence Living Arrangements: Non-relatives/Friends Available Help at Discharge: Available 24 hours/day;Friend(s) Type of Home: House Home Access: Stairs to enter [employer] of Steps: 3   Home Layout: Two level;Bed/bath upstairs Alternate Level Stairs-Number of Steps: Flight   Bathroom Shower/Tub: Hospital doctor Toilet: Handicapped height     Home Equipment: Environmental consultant - 2 wheels;Walker - 4 wheels;Cane - single point;Shower seat;Grab bars - toilet;Grab bars - tub/shower;Hand held shower head          Prior Functioning/Environment Level of Independence: Independent        Comments: Occasionally uses knee brace        OT Problem List: Decreased strength;Decreased knowledge of precautions;Pain      OT Treatment/Interventions:      OT Goals(Current goals can be found in the care plan section) Acute Rehab OT Goals Patient Stated Goal: Home today OT Goal Formulation: All assessment and education complete, DC therapy  OT Frequency:     Barriers to D/C:            Co-evaluation              End of Session Equipment Utilized During Treatment: Back brace Nurse Communication: Mobility status;Other (comment) (no equipment or f/u needs)  Activity Tolerance: Patient tolerated treatment well Patient left: in chair;with call bell/phone within reach  OT Visit Diagnosis: Pain Pain - part of body:   (back)                Time: 5320-2334 OT Time Calculation (min): 13 min Charges:  OT General Charges $OT Visit: 1 Procedure OT Evaluation $OT Eval Moderate Complexity: 1 Procedure G-Codes:     Marisabel Macpherson A. Ulice Brilliant, M.S., OTR/L Pager: [place] 11/25/2016, 9:58 AM

## 2016-11-25 NOTE — Progress Notes (Signed)
    Patient doing well Patient denies leg pain Minimal back pain Has been ambulating   Physical Exam: Vitals:   11/24/16 2335 11/25/16 0408  BP: 117/65 102/63  Pulse: 84 86  Resp: 18 18  Temp: 97.9 F (36.6 C) 98.1 F (36.7 C)    Dressing in place NVI  POD #1 s/p L5/S1 A/P fusion, doing well  - up with PT/OT, encourage ambulation - Dilaudid for pain, Valium for muscle spasms - likely d/c home today vs tomorrow depending on progress

## 2016-11-25 NOTE — Anesthesia Postprocedure Evaluation (Signed)
Anesthesia Post Note  Patient: [name]  Procedure(s) Performed: Procedure(s) (LRB): LUMBAR 5-SACRUM 1 ANTERIOR LUMBAR INTERBODY FUSION (Bilateral) LUMBAR 5-SACRUM 1 POSTERIOR SPINAL FUSION WITH INSTRUMENTATION AND ALLOGRAFT (Bilateral) ABDOMINAL EXPOSURE (N/A)  Patient location during evaluation: PACU Anesthesia Type: General Level of consciousness: awake and alert Pain management: pain level controlled Vital Signs Assessment: post-procedure vital signs reviewed and stable Respiratory status: spontaneous breathing, nonlabored ventilation, respiratory function stable and patient connected to nasal cannula oxygen Cardiovascular status: blood pressure returned to baseline and stable Postop Assessment: no signs of nausea or vomiting Anesthetic complications: no       Last Vitals:  Vitals:   11/24/16 2335 11/25/16 0408  BP: 117/65 102/63  Pulse: 84 86  Resp: 18 18  Temp: 36.6 C 36.7 C    Last Pain:  Vitals:   11/25/16 0624  TempSrc:   PainSc: [place] Edward Keshayla Schrum

## 2016-11-25 NOTE — Progress Notes (Signed)
Pt doing well. Pt given D/C instructions with Rx's, verbal understanding was provided. Pt's IV was removed prior to D/C. Pt's incision is clean and dry with no sign of infection. Pt D/C'd home via wheelchair with TLSO brace @ 1155 per MD order. Pt is stable @ D/C and has no other needs at this time. Holli Humbles, RN

## 2016-11-25 NOTE — Evaluation (Signed)
Physical Therapy Evaluation Patient Details Name: [name] MRN: [id number] DOB: [birth year] Today's Date: 11/25/2016   History of Present Illness  Pt is a 66 y/o female who presents s/p L5-S1 ALIF on 11/24/16.  Clinical Impression  Pt admitted with above diagnosis. Pt currently with functional limitations due to the deficits listed below (see PT Problem List). At the time of PT eval pt was able to perform transfers and ambulation with min guard assist for balance support and safety. Pt will benefit from skilled PT to increase their independence and safety with mobility to allow discharge to the venue listed below.       Follow Up Recommendations No PT follow up;Supervision for mobility/OOB    Equipment Recommendations  None recommended by PT    Recommendations for Other Services       Precautions / Restrictions Precautions Precautions: Fall;Back Precaution Booklet Issued: Yes (comment) Precaution Comments: Handout reviewed. Pt was cued for precautions during functional mobility.  Required Braces or Orthoses: Spinal Brace Spinal Brace: Thoracolumbosacral orthotic;Applied in sitting position Restrictions Weight Bearing Restrictions: No      Mobility  Bed Mobility Overal bed mobility: Needs Assistance Bed Mobility: Rolling;Sidelying to Sit;Sit to Sidelying Rolling: Supervision Sidelying to sit: Min guard     Sit to sidelying: Min guard General bed mobility comments: VC's for log roll technique. Pt required step-by-step instruction to maintain precautions.   Transfers Overall transfer level: Needs assistance Equipment used: None Transfers: Sit to/from Stand Sit to Stand: Supervision         General transfer comment: Close supervision for safety. Pt was able to power-up to full standing position without assistance.   Ambulation/Gait Ambulation/Gait assistance: Min guard Ambulation Distance (Feet): 300 Feet Assistive device: None Gait Pattern/deviations:  Step-through pattern;Decreased stride length;Trunk flexed Gait velocity: Decreased Gait velocity interpretation: Below normal speed for age/gender General Gait Details: VC's for improved posture, general safety, and maintenance of precautions. No gross unsteadiness or LOB noted.   Stairs Stairs: Yes Stairs assistance: Min guard Stair Management: One rail Left;Step to pattern;Forwards Number of Stairs: 10 General stair comments: VC's for sequencing and general safety  Wheelchair Mobility    Modified Rankin (Stroke Patients Only)       Balance Overall balance assessment: No apparent balance deficits (not formally assessed)                                           Pertinent Vitals/Pain Pain Assessment: Faces Faces Pain Scale: Hurts a little bit Pain Descriptors / Indicators: Operative site guarding;Discomfort    Home Living Family/patient expects to be discharged to:: Private residence Living Arrangements: Spouse/significant other Available Help at Discharge: Family;Available 24 hours/day Type of Home: House Home Access: Stairs to enter   [employer] of Steps: 3 Home Layout: Two level;Bed/bath upstairs Home Equipment: Walker - 2 wheels;Walker - 4 wheels;Cane - single point      Prior Function Level of Independence: Independent         Comments: Occasionally uses knee brace     Hand Dominance        Extremity/Trunk Assessment   Upper Extremity Assessment Upper Extremity Assessment: Defer to OT evaluation    Lower Extremity Assessment Lower Extremity Assessment: Generalized weakness (Consistent with pre-op diagnosis)    Cervical / Trunk Assessment Cervical / Trunk Assessment: Other exceptions Cervical / Trunk Exceptions: forward head/rounded shoulders; s/p surgery  Communication   Communication: No difficulties  Cognition Arousal/Alertness: Awake/alert Behavior During Therapy: WFL for tasks assessed/performed Overall  Cognitive Status: Within Functional Limits for tasks assessed                                        General Comments      Exercises     Assessment/Plan    PT Assessment Patient needs continued PT services  PT Problem List Decreased strength;Decreased range of motion;Decreased activity tolerance;Decreased balance;Decreased mobility;Decreased knowledge of use of DME;Decreased safety awareness;Decreased knowledge of precautions;Pain       PT Treatment Interventions DME instruction;Gait training;Stair training;Functional mobility training;Therapeutic activities;Therapeutic exercise;Neuromuscular re-education;Patient/family education    PT Goals (Current goals can be found in the Care Plan section)  Acute Rehab PT Goals Patient Stated Goal: Home today PT Goal Formulation: With patient Time For Goal Achievement: 12/02/16 Potential to Achieve Goals: Good    Frequency Min 5X/week   Barriers to discharge Decreased caregiver support Housemate/partner has MS and is unable to provide assistance for her    Co-evaluation               End of Session Equipment Utilized During Treatment: Gait belt;Back brace Activity Tolerance: Patient tolerated treatment well Patient left: Other (comment) (Sitting EOB with OT) Nurse Communication: Mobility status PT Visit Diagnosis: Other abnormalities of gait and mobility (R26.89);Pain Pain - part of body:  (Back)    Time: 0354-6568 PT Time Calculation (min) (ACUTE ONLY): 36 min   Charges:   PT Evaluation $PT Eval Moderate Complexity: 1 Procedure PT Treatments $Gait Training: 8-22 mins   PT G Codes:        Rolinda Roan, PT, DPT Acute Rehabilitation Services Pager: [phone]   Thelma Comp 11/25/2016, 9:16 AM

## 2016-12-08 DIAGNOSIS — Z9889 Other specified postprocedural states: Secondary | ICD-10-CM | POA: Diagnosis not present

## 2016-12-08 NOTE — Discharge Summary (Signed)
Patient ID: [name] MRN: [id number] DOB/AGE: 12/07/1950 66 y.o.  Admit date: 11/24/2016 Discharge date: 11/25/2016  Admission Diagnoses:  Active Problems:   Radiculopathy   Discharge Diagnoses:  Same  Past Medical History:  Diagnosis Date  . Alcohol abuse, in remission   . Cancer of sigmoid colon ([place]) 07/2005   T2N0M0  . Chronic bronchitis   . Depression   . Dyspnea   . Gastritis   . GERD (gastroesophageal reflux disease)   . History of hiatal hernia   . Hyperlipidemia   . Hypertension   . Hypothyroid    no meds  . Migraines   . OA (osteoarthritis)   . PONV (postoperative nausea and vomiting)   . Reflux     Surgeries: Procedure(s): LUMBAR 5-SACRUM 1 ANTERIOR LUMBAR INTERBODY FUSION LUMBAR 5-SACRUM 1 POSTERIOR SPINAL FUSION WITH INSTRUMENTATION AND ALLOGRAFT ABDOMINAL EXPOSURE on 11/24/2016   Consultants: Dr Donnetta Hutching for vascular exposure   Discharged Condition: Improved  Hospital Course: [name] is an 66 y.o. female who was admitted 11/24/2016 for operative treatment of radiculopathy and DDD. Patient has severe unremitting pain that affects sleep, daily activities, and work/hobbies. After pre-op clearance the patient was taken to the operating room on 11/24/2016 and underwent  Procedure(s): LUMBAR 5-SACRUM 1 ANTERIOR LUMBAR INTERBODY FUSION LUMBAR 5-SACRUM 1 POSTERIOR SPINAL FUSION WITH INSTRUMENTATION AND ALLOGRAFT ABDOMINAL EXPOSURE.    Patient was given perioperative antibiotics:  Anti-infectives    Start     Dose/Rate Route Frequency Ordered Stop   11/25/16 0200  vancomycin (VANCOCIN) 1,500 mg in sodium chloride 0.9 % 500 mL IVPB     1,500 mg 250 mL/hr over 120 Minutes Intravenous  Once 11/24/16 1503 11/25/16 0331   11/24/16 0542  vancomycin (VANCOCIN) IVPB 1000 mg/200 mL premix     1,000 mg 200 mL/hr over 60 Minutes Intravenous On call to O.R. 11/24/16 0542 11/24/16 0845       Patient was given sequential compression devices, early  ambulation to prevent DVT.  Patient benefited maximally from hospital stay and there were no complications.    Recent vital signs: BP 125/72   Pulse 79   Temp 98.2 F (36.8 C)   Resp 16   SpO2 99%    Discharge Medications:   Allergies as of 11/25/2016      Reactions   Benadryl [diphenhydramine Hcl] Nausea And Vomiting, Other (See Comments)   Migraine headaches.   Lansoprazole Nausea And Vomiting, Other (See Comments)   Migraine headache   Morphine Sulfate Nausea And Vomiting, Other (See Comments)   Migraine headaches.   Penicillins Nausea And Vomiting, Other (See Comments)   INTOLERANCE >  NAUSEA & VOMITING YEAST INFECTION > CHANGE IN NORMAL FLORA Has patient had a PCN reaction causing immediate rash, facial/tongue/throat swelling, SOB or lightheadedness with hypotension:No Has patient had a PCN reaction causing severe rash involving mucus membranes or skin necrosis: No Has patient had a PCN reaction that required hospitalization No Has patient had a PCN reaction occurring within the last 10 years:No   Red Dye Other (See Comments)   Also allergic to [facility] Dye-migraine headaches.   Simvastatin Nausea And Vomiting, Other (See Comments)   Migraine   Codeine Nausea And Vomiting   Shellfish Allergy Other (See Comments)   Congestion   Sulfa Antibiotics Nausea And Vomiting      Medication List    STOP taking these medications   BC FAST PAIN RELIEF 650-195-33.3 MG Pack Generic drug:  Aspirin-Salicylamide-Caffeine  TAKE these medications   amitriptyline 25 MG tablet Commonly known as:  ELAVIL Take 100 mg by mouth at bedtime.   amLODipine 5 MG tablet Commonly known as:  NORVASC Take 5 mg by mouth at bedtime.   calcium carbonate 500 MG chewable tablet Commonly known as:  TUMS - dosed in mg elemental calcium Chew 2 tablets by mouth 4 (four) times daily as needed for indigestion or heartburn.   gabapentin 300 MG capsule Commonly known as:  NEURONTIN Take 300 mg by  mouth See admin instructions. PATIENT TYPICALLY TAKES TWICE DAILY--BUT WILL TAKE THE 3RD DOSE OCCASIONALLY   HYDROmorphone 2 MG tablet Commonly known as:  DILAUDID Take 0.5-1 tablets (1-2 mg total) by mouth every 4 (four) hours as needed for severe pain.   ranitidine 150 MG tablet Commonly known as:  ZANTAC Take 150 mg by mouth daily as needed (for acid reflux/heartburn).   Vitamin B-12 5000 MCG Tbdp Take 5,000 mcg by mouth at bedtime.   Vitamin D 2000 units Caps Take 2,000 Units by mouth at bedtime.   vitamin E 1000 UNIT capsule Take 1,000 Units by mouth at bedtime.       Diagnostic Studies: Dg Chest 2 View  Result Date: 11/16/2016 CLINICAL DATA:  Preoperative examination prior to lumbar surgery. No current chest complaints. History of medicated hypertension, current smoker. EXAM: CHEST  2 VIEW COMPARISON:  Chest x-ray of September 01, 2010 and CT scan of the chest of January 26, 2013 FINDINGS: The lungs are adequately inflated and clear. The heart and pulmonary vascularity are normal. There are calcifications in the wall of the aortic arch. The mediastinum is normal in width. There is no pleural effusion. There is very gentle S shaped thoracolumbar curvature which is chronic. The bony thorax exhibits no acute abnormality. IMPRESSION: There is no acute cardiopulmonary abnormality. Thoracic aortic atherosclerosis. Electronically Signed   By: David  Martinique M.D.   On: 11/16/2016 13:44   Dg Lumbar Spine 2-3 Views  Result Date: 11/24/2016 CLINICAL DATA:  Anterior fusion at L5-S1 EXAM: LUMBAR SPINE - 2-3 VIEW COMPARISON:  MR lumbar spine of 05/31/2016 FINDINGS: Three C-arm spot films were returned. These show anterior fusion at S1 with posterior hardware placed for fusion at L5-S1. An body interbody fusion plug is noted at L5-S1 as well which appears to be in good position. IMPRESSION: Anterior and posterior fusion at L5-S1. Interbody fusion plug in good position. Electronically Signed   By: Ivar Drape M.D.   On: 11/24/2016 11:45   Dg C-arm 1-60 Min  Result Date: 11/24/2016 CLINICAL DATA:  L5-S1 fusion EXAM: DG C-ARM 61-120 MIN COMPARISON:  MR lumbar spine of 05/31/2016 FINDINGS: C-arm fluoroscopy was provided during anterior and posterior fusion at L5-S1. Fluoroscopy time of 1 minutes 52 seconds was recorded. IMPRESSION: C-arm fluoroscopy provided during fusion at L5-S1. Electronically Signed   By: Ivar Drape M.D.   On: 11/24/2016 11:47   Dg Or Local Abdomen  Addendum Date: 11/24/2016   ADDENDUM REPORT: 11/24/2016 10:19 ADDENDUM: Addendum created after discussing the plain film with the operating room staff. No radiopaque foreign body identified. These results were called by telephone at the time of interpretation on 11/24/2016 at 10:19 am to Ms Katy Apo, RN, who verbally acknowledged these results. Electronically Signed   By: Corrie Mckusick D.O.   On: 11/24/2016 10:19   Result Date: 11/24/2016 CLINICAL DATA:  66 year old female with a history of anterior lumbar interbody fusion EXAM: OR LOCAL ABDOMEN COMPARISON:  MRI 05/31/2016,  CT 01/26/2013 FINDINGS: Single intraoperative anterior view of the pelvis demonstrates surgical hardware of reported anterior lumbar interbody fusion. Gas within the left anatomic pelvis, compatible with early postop change. IMPRESSION: Single anterior view of the pelvis demonstrates early postoperative changes of L5-S1 anterior lumbar interbody fusion. Electronically Signed: By: Corrie Mckusick D.O. On: 11/24/2016 10:10    Disposition: 01-Home or Self Care   POD #1 s/p L5/S1 A/P fusion, doing well  - up with PT/OT, encourage ambulation - Dilaudid for pain, Valium for muscle spasms -Written scripts for pain signed and in chart -D/C instructions sheet printed and in chart -D/C today  -F/U in office 2 weeks   Signed: Justice Britain 12/08/2016, 2:04 PM

## 2017-01-04 DIAGNOSIS — M545 Low back pain: Secondary | ICD-10-CM | POA: Diagnosis not present

## 2017-01-28 DIAGNOSIS — F322 Major depressive disorder, single episode, severe without psychotic features: Secondary | ICD-10-CM | POA: Diagnosis not present

## 2017-02-14 DIAGNOSIS — M545 Low back pain: Secondary | ICD-10-CM | POA: Diagnosis not present

## 2017-02-14 DIAGNOSIS — Z9889 Other specified postprocedural states: Secondary | ICD-10-CM | POA: Diagnosis not present

## 2017-02-16 ENCOUNTER — Other Ambulatory Visit: Payer: Self-pay | Admitting: Orthopedic Surgery

## 2017-02-16 DIAGNOSIS — M5416 Radiculopathy, lumbar region: Secondary | ICD-10-CM

## 2017-02-21 DIAGNOSIS — M4326 Fusion of spine, lumbar region: Secondary | ICD-10-CM | POA: Diagnosis not present

## 2017-02-21 DIAGNOSIS — M545 Low back pain: Secondary | ICD-10-CM | POA: Diagnosis not present

## 2017-02-27 ENCOUNTER — Ambulatory Visit
Admission: RE | Admit: 2017-02-27 | Discharge: 2017-02-27 | Disposition: A | Discharge status: Home / Self Care | Payer: Medicare Other | Source: Ambulatory Visit | Attending: Orthopedic Surgery | Admitting: Orthopedic Surgery

## 2017-02-27 DIAGNOSIS — M5416 Radiculopathy, lumbar region: Secondary | ICD-10-CM

## 2017-02-27 DIAGNOSIS — M5126 Other intervertebral disc displacement, lumbar region: Secondary | ICD-10-CM | POA: Diagnosis not present

## 2017-03-01 DIAGNOSIS — M545 Low back pain: Secondary | ICD-10-CM | POA: Diagnosis not present

## 2017-03-02 ENCOUNTER — Other Ambulatory Visit: Payer: Self-pay | Admitting: Orthopedic Surgery

## 2017-03-02 DIAGNOSIS — G8929 Other chronic pain: Secondary | ICD-10-CM

## 2017-03-02 DIAGNOSIS — M545 Low back pain: Principal | ICD-10-CM

## 2017-03-03 ENCOUNTER — Telehealth: Payer: Self-pay | Admitting: Radiology

## 2017-03-03 NOTE — Telephone Encounter (Signed)
Called 13 hour prep in to [employer]. Called pt and explained prep and times to take Prednisone and Benadryl

## 2017-03-07 DIAGNOSIS — M545 Low back pain: Secondary | ICD-10-CM | POA: Diagnosis not present

## 2017-03-07 DIAGNOSIS — M4326 Fusion of spine, lumbar region: Secondary | ICD-10-CM | POA: Diagnosis not present

## 2017-03-11 ENCOUNTER — Encounter: Payer: Self-pay | Admitting: Radiology

## 2017-03-14 ENCOUNTER — Ambulatory Visit
Admission: RE | Admit: 2017-03-14 | Discharge: 2017-03-14 | Disposition: A | Discharge status: Home / Self Care | Payer: Medicare Other | Source: Ambulatory Visit | Attending: Orthopedic Surgery | Admitting: Orthopedic Surgery

## 2017-03-14 DIAGNOSIS — M545 Low back pain: Principal | ICD-10-CM

## 2017-03-14 DIAGNOSIS — R102 Pelvic and perineal pain: Secondary | ICD-10-CM | POA: Diagnosis not present

## 2017-03-14 DIAGNOSIS — G8929 Other chronic pain: Secondary | ICD-10-CM

## 2017-03-16 DIAGNOSIS — H5203 Hypermetropia, bilateral: Secondary | ICD-10-CM | POA: Diagnosis not present

## 2017-03-16 DIAGNOSIS — M5441 Lumbago with sciatica, right side: Secondary | ICD-10-CM | POA: Diagnosis not present

## 2017-03-16 DIAGNOSIS — H52223 Regular astigmatism, bilateral: Secondary | ICD-10-CM | POA: Diagnosis not present

## 2017-03-16 DIAGNOSIS — H35362 Drusen (degenerative) of macula, left eye: Secondary | ICD-10-CM | POA: Diagnosis not present

## 2017-03-28 DIAGNOSIS — M533 Sacrococcygeal disorders, not elsewhere classified: Secondary | ICD-10-CM | POA: Diagnosis not present

## 2017-04-08 DIAGNOSIS — M533 Sacrococcygeal disorders, not elsewhere classified: Secondary | ICD-10-CM | POA: Diagnosis not present

## 2017-04-14 DIAGNOSIS — M533 Sacrococcygeal disorders, not elsewhere classified: Secondary | ICD-10-CM | POA: Diagnosis not present

## 2017-04-28 DIAGNOSIS — H53001 Unspecified amblyopia, right eye: Secondary | ICD-10-CM | POA: Diagnosis not present

## 2017-04-28 DIAGNOSIS — H25811 Combined forms of age-related cataract, right eye: Secondary | ICD-10-CM | POA: Diagnosis not present

## 2017-04-28 DIAGNOSIS — H25812 Combined forms of age-related cataract, left eye: Secondary | ICD-10-CM | POA: Diagnosis not present

## 2017-05-09 DIAGNOSIS — H25811 Combined forms of age-related cataract, right eye: Secondary | ICD-10-CM | POA: Diagnosis not present

## 2017-05-09 DIAGNOSIS — H2512 Age-related nuclear cataract, left eye: Secondary | ICD-10-CM | POA: Diagnosis not present

## 2017-05-09 DIAGNOSIS — H2511 Age-related nuclear cataract, right eye: Secondary | ICD-10-CM | POA: Diagnosis not present

## 2017-05-16 DIAGNOSIS — M533 Sacrococcygeal disorders, not elsewhere classified: Secondary | ICD-10-CM | POA: Diagnosis not present

## 2017-06-06 DIAGNOSIS — H25812 Combined forms of age-related cataract, left eye: Secondary | ICD-10-CM | POA: Diagnosis not present

## 2017-08-02 HISTORY — PX: COLONOSCOPY: SHX174

## 2017-08-05 DIAGNOSIS — N182 Chronic kidney disease, stage 2 (mild): Secondary | ICD-10-CM | POA: Diagnosis not present

## 2017-08-05 DIAGNOSIS — E782 Mixed hyperlipidemia: Secondary | ICD-10-CM | POA: Diagnosis not present

## 2017-08-05 DIAGNOSIS — F17210 Nicotine dependence, cigarettes, uncomplicated: Secondary | ICD-10-CM | POA: Diagnosis not present

## 2017-08-05 DIAGNOSIS — I10 Essential (primary) hypertension: Secondary | ICD-10-CM | POA: Diagnosis not present

## 2017-08-05 DIAGNOSIS — F322 Major depressive disorder, single episode, severe without psychotic features: Secondary | ICD-10-CM | POA: Diagnosis not present

## 2017-08-05 DIAGNOSIS — E1121 Type 2 diabetes mellitus with diabetic nephropathy: Secondary | ICD-10-CM | POA: Diagnosis not present

## 2017-08-05 DIAGNOSIS — F410 Panic disorder [episodic paroxysmal anxiety] without agoraphobia: Secondary | ICD-10-CM | POA: Diagnosis not present

## 2017-08-09 DIAGNOSIS — E039 Hypothyroidism, unspecified: Secondary | ICD-10-CM | POA: Diagnosis not present

## 2017-08-09 DIAGNOSIS — E1121 Type 2 diabetes mellitus with diabetic nephropathy: Secondary | ICD-10-CM | POA: Diagnosis not present

## 2017-08-09 DIAGNOSIS — E559 Vitamin D deficiency, unspecified: Secondary | ICD-10-CM | POA: Diagnosis not present

## 2017-08-09 DIAGNOSIS — Z79899 Other long term (current) drug therapy: Secondary | ICD-10-CM | POA: Diagnosis not present

## 2017-08-09 DIAGNOSIS — E782 Mixed hyperlipidemia: Secondary | ICD-10-CM | POA: Diagnosis not present

## 2017-08-13 ENCOUNTER — Other Ambulatory Visit: Payer: Self-pay | Admitting: Family Medicine

## 2017-08-13 DIAGNOSIS — F17210 Nicotine dependence, cigarettes, uncomplicated: Secondary | ICD-10-CM

## 2017-08-25 DIAGNOSIS — Z79899 Other long term (current) drug therapy: Secondary | ICD-10-CM | POA: Diagnosis not present

## 2017-08-29 ENCOUNTER — Ambulatory Visit
Admission: RE | Admit: 2017-08-29 | Discharge: 2017-08-29 | Disposition: A | Discharge status: Home / Self Care | Payer: Medicare Other | Source: Ambulatory Visit | Attending: Family Medicine | Admitting: Family Medicine

## 2017-08-29 DIAGNOSIS — F17210 Nicotine dependence, cigarettes, uncomplicated: Secondary | ICD-10-CM

## 2017-09-12 DIAGNOSIS — Z716 Tobacco abuse counseling: Secondary | ICD-10-CM | POA: Diagnosis not present

## 2017-09-12 DIAGNOSIS — F17210 Nicotine dependence, cigarettes, uncomplicated: Secondary | ICD-10-CM | POA: Diagnosis not present

## 2017-09-12 DIAGNOSIS — M533 Sacrococcygeal disorders, not elsewhere classified: Secondary | ICD-10-CM | POA: Diagnosis not present

## 2017-09-27 DIAGNOSIS — M533 Sacrococcygeal disorders, not elsewhere classified: Secondary | ICD-10-CM | POA: Diagnosis not present

## 2017-10-17 DIAGNOSIS — F17210 Nicotine dependence, cigarettes, uncomplicated: Secondary | ICD-10-CM | POA: Diagnosis not present

## 2017-10-17 DIAGNOSIS — M533 Sacrococcygeal disorders, not elsewhere classified: Secondary | ICD-10-CM | POA: Diagnosis not present

## 2017-10-17 DIAGNOSIS — F17291 Nicotine dependence, other tobacco product, in remission: Secondary | ICD-10-CM | POA: Diagnosis not present

## 2017-10-17 DIAGNOSIS — Z87891 Personal history of nicotine dependence: Secondary | ICD-10-CM | POA: Diagnosis not present

## 2018-01-12 DIAGNOSIS — E559 Vitamin D deficiency, unspecified: Secondary | ICD-10-CM | POA: Diagnosis not present

## 2018-01-12 DIAGNOSIS — E7800 Pure hypercholesterolemia, unspecified: Secondary | ICD-10-CM | POA: Diagnosis not present

## 2018-04-13 DIAGNOSIS — N182 Chronic kidney disease, stage 2 (mild): Secondary | ICD-10-CM | POA: Diagnosis not present

## 2018-04-13 DIAGNOSIS — E7800 Pure hypercholesterolemia, unspecified: Secondary | ICD-10-CM | POA: Diagnosis not present

## 2018-04-13 DIAGNOSIS — F322 Major depressive disorder, single episode, severe without psychotic features: Secondary | ICD-10-CM | POA: Diagnosis not present

## 2018-04-13 DIAGNOSIS — E1121 Type 2 diabetes mellitus with diabetic nephropathy: Secondary | ICD-10-CM | POA: Diagnosis not present

## 2018-04-13 DIAGNOSIS — Z23 Encounter for immunization: Secondary | ICD-10-CM | POA: Diagnosis not present

## 2018-04-13 DIAGNOSIS — I10 Essential (primary) hypertension: Secondary | ICD-10-CM | POA: Diagnosis not present

## 2018-04-19 DIAGNOSIS — N182 Chronic kidney disease, stage 2 (mild): Secondary | ICD-10-CM | POA: Diagnosis not present

## 2018-04-19 DIAGNOSIS — E559 Vitamin D deficiency, unspecified: Secondary | ICD-10-CM | POA: Diagnosis not present

## 2018-04-19 DIAGNOSIS — I10 Essential (primary) hypertension: Secondary | ICD-10-CM | POA: Diagnosis not present

## 2018-04-21 ENCOUNTER — Ambulatory Visit
Admission: RE | Admit: 2018-04-21 | Discharge: 2018-04-21 | Disposition: A | Discharge status: Home / Self Care | Payer: Medicare Other | Source: Ambulatory Visit | Attending: Physician Assistant | Admitting: Physician Assistant

## 2018-04-21 ENCOUNTER — Other Ambulatory Visit (INDEPENDENT_AMBULATORY_CARE_PROVIDER_SITE_OTHER): Payer: Self-pay | Admitting: Physician Assistant

## 2018-04-21 DIAGNOSIS — M25561 Pain in right knee: Secondary | ICD-10-CM

## 2018-04-21 DIAGNOSIS — M549 Dorsalgia, unspecified: Secondary | ICD-10-CM

## 2018-04-21 DIAGNOSIS — M25562 Pain in left knee: Principal | ICD-10-CM

## 2018-04-21 DIAGNOSIS — M4726 Other spondylosis with radiculopathy, lumbar region: Secondary | ICD-10-CM | POA: Diagnosis not present

## 2018-04-21 DIAGNOSIS — M170 Bilateral primary osteoarthritis of knee: Secondary | ICD-10-CM | POA: Diagnosis not present

## 2018-09-05 ENCOUNTER — Other Ambulatory Visit: Payer: Self-pay | Admitting: Family Medicine

## 2018-09-05 DIAGNOSIS — F17210 Nicotine dependence, cigarettes, uncomplicated: Secondary | ICD-10-CM

## 2018-09-25 ENCOUNTER — Ambulatory Visit
Admission: RE | Admit: 2018-09-25 | Discharge: 2018-09-25 | Disposition: A | Discharge status: Home / Self Care | Payer: Medicare Other | Source: Ambulatory Visit | Attending: Family Medicine | Admitting: Family Medicine

## 2018-09-25 DIAGNOSIS — F17210 Nicotine dependence, cigarettes, uncomplicated: Secondary | ICD-10-CM

## 2018-10-26 DIAGNOSIS — F322 Major depressive disorder, single episode, severe without psychotic features: Secondary | ICD-10-CM | POA: Diagnosis not present

## 2019-04-28 DIAGNOSIS — Z23 Encounter for immunization: Secondary | ICD-10-CM | POA: Diagnosis not present

## 2019-06-21 DIAGNOSIS — E1121 Type 2 diabetes mellitus with diabetic nephropathy: Secondary | ICD-10-CM | POA: Diagnosis not present

## 2019-06-21 DIAGNOSIS — E039 Hypothyroidism, unspecified: Secondary | ICD-10-CM | POA: Diagnosis not present

## 2019-06-21 DIAGNOSIS — Z79899 Other long term (current) drug therapy: Secondary | ICD-10-CM | POA: Diagnosis not present

## 2019-06-21 DIAGNOSIS — E782 Mixed hyperlipidemia: Secondary | ICD-10-CM | POA: Diagnosis not present

## 2019-06-21 DIAGNOSIS — E559 Vitamin D deficiency, unspecified: Secondary | ICD-10-CM | POA: Diagnosis not present

## 2019-11-09 ENCOUNTER — Other Ambulatory Visit: Payer: Self-pay | Admitting: Family Medicine

## 2019-11-09 DIAGNOSIS — F17200 Nicotine dependence, unspecified, uncomplicated: Secondary | ICD-10-CM

## 2019-11-09 DIAGNOSIS — Z122 Encounter for screening for malignant neoplasm of respiratory organs: Secondary | ICD-10-CM

## 2019-11-12 ENCOUNTER — Other Ambulatory Visit: Payer: Self-pay | Admitting: Family Medicine

## 2019-11-12 DIAGNOSIS — F17200 Nicotine dependence, unspecified, uncomplicated: Secondary | ICD-10-CM

## 2019-11-12 DIAGNOSIS — Z122 Encounter for screening for malignant neoplasm of respiratory organs: Secondary | ICD-10-CM

## 2019-12-11 ENCOUNTER — Ambulatory Visit
Admission: RE | Admit: 2019-12-11 | Discharge: 2019-12-11 | Disposition: A | Discharge status: Home / Self Care | Payer: Medicare Other | Source: Ambulatory Visit | Attending: Family Medicine | Admitting: Family Medicine

## 2019-12-11 DIAGNOSIS — Z122 Encounter for screening for malignant neoplasm of respiratory organs: Secondary | ICD-10-CM

## 2019-12-11 DIAGNOSIS — F17210 Nicotine dependence, cigarettes, uncomplicated: Secondary | ICD-10-CM | POA: Diagnosis not present

## 2019-12-11 DIAGNOSIS — F17200 Nicotine dependence, unspecified, uncomplicated: Secondary | ICD-10-CM

## 2020-01-04 DIAGNOSIS — E782 Mixed hyperlipidemia: Secondary | ICD-10-CM | POA: Diagnosis not present

## 2020-01-04 DIAGNOSIS — D692 Other nonthrombocytopenic purpura: Secondary | ICD-10-CM | POA: Diagnosis not present

## 2020-01-04 DIAGNOSIS — E1121 Type 2 diabetes mellitus with diabetic nephropathy: Secondary | ICD-10-CM | POA: Diagnosis not present

## 2020-01-04 DIAGNOSIS — I10 Essential (primary) hypertension: Secondary | ICD-10-CM | POA: Diagnosis not present

## 2020-01-04 DIAGNOSIS — F331 Major depressive disorder, recurrent, moderate: Secondary | ICD-10-CM | POA: Diagnosis not present

## 2020-01-04 DIAGNOSIS — J439 Emphysema, unspecified: Secondary | ICD-10-CM | POA: Diagnosis not present

## 2020-01-04 DIAGNOSIS — E039 Hypothyroidism, unspecified: Secondary | ICD-10-CM | POA: Diagnosis not present

## 2020-01-09 DIAGNOSIS — K5901 Slow transit constipation: Secondary | ICD-10-CM | POA: Diagnosis not present

## 2020-01-09 DIAGNOSIS — Z85038 Personal history of other malignant neoplasm of large intestine: Secondary | ICD-10-CM | POA: Diagnosis not present

## 2020-02-20 DIAGNOSIS — Z85038 Personal history of other malignant neoplasm of large intestine: Secondary | ICD-10-CM | POA: Diagnosis not present

## 2020-02-20 DIAGNOSIS — K5730 Diverticulosis of large intestine without perforation or abscess without bleeding: Secondary | ICD-10-CM | POA: Diagnosis not present

## 2020-02-20 DIAGNOSIS — Z86010 Personal history of colonic polyps: Secondary | ICD-10-CM | POA: Diagnosis not present

## 2020-02-20 DIAGNOSIS — D125 Benign neoplasm of sigmoid colon: Secondary | ICD-10-CM | POA: Diagnosis not present

## 2020-02-20 DIAGNOSIS — D124 Benign neoplasm of descending colon: Secondary | ICD-10-CM | POA: Diagnosis not present

## 2020-02-20 DIAGNOSIS — D120 Benign neoplasm of cecum: Secondary | ICD-10-CM | POA: Diagnosis not present

## 2020-02-20 DIAGNOSIS — Z980 Intestinal bypass and anastomosis status: Secondary | ICD-10-CM | POA: Diagnosis not present

## 2020-02-22 DIAGNOSIS — D125 Benign neoplasm of sigmoid colon: Secondary | ICD-10-CM | POA: Diagnosis not present

## 2020-02-22 DIAGNOSIS — D120 Benign neoplasm of cecum: Secondary | ICD-10-CM | POA: Diagnosis not present

## 2020-02-22 DIAGNOSIS — D124 Benign neoplasm of descending colon: Secondary | ICD-10-CM | POA: Diagnosis not present

## 2020-03-16 DIAGNOSIS — M25522 Pain in left elbow: Secondary | ICD-10-CM | POA: Diagnosis not present

## 2020-03-16 DIAGNOSIS — Z79899 Other long term (current) drug therapy: Secondary | ICD-10-CM | POA: Diagnosis not present

## 2020-03-16 DIAGNOSIS — G8911 Acute pain due to trauma: Secondary | ICD-10-CM | POA: Diagnosis not present

## 2020-03-16 DIAGNOSIS — S51012A Laceration without foreign body of left elbow, initial encounter: Secondary | ICD-10-CM | POA: Diagnosis not present

## 2020-03-16 DIAGNOSIS — F17210 Nicotine dependence, cigarettes, uncomplicated: Secondary | ICD-10-CM | POA: Diagnosis not present

## 2020-03-16 DIAGNOSIS — Z885 Allergy status to narcotic agent status: Secondary | ICD-10-CM | POA: Diagnosis not present

## 2020-03-16 DIAGNOSIS — Z23 Encounter for immunization: Secondary | ICD-10-CM | POA: Diagnosis not present

## 2020-03-19 DIAGNOSIS — S41109A Unspecified open wound of unspecified upper arm, initial encounter: Secondary | ICD-10-CM | POA: Diagnosis not present

## 2020-03-25 DIAGNOSIS — F17200 Nicotine dependence, unspecified, uncomplicated: Secondary | ICD-10-CM | POA: Diagnosis not present

## 2020-03-25 DIAGNOSIS — F17210 Nicotine dependence, cigarettes, uncomplicated: Secondary | ICD-10-CM | POA: Diagnosis not present

## 2020-03-25 DIAGNOSIS — L98492 Non-pressure chronic ulcer of skin of other sites with fat layer exposed: Secondary | ICD-10-CM | POA: Diagnosis not present

## 2020-04-01 DIAGNOSIS — L98492 Non-pressure chronic ulcer of skin of other sites with fat layer exposed: Secondary | ICD-10-CM | POA: Diagnosis not present

## 2020-04-01 DIAGNOSIS — F17200 Nicotine dependence, unspecified, uncomplicated: Secondary | ICD-10-CM | POA: Diagnosis not present

## 2020-04-01 DIAGNOSIS — F17210 Nicotine dependence, cigarettes, uncomplicated: Secondary | ICD-10-CM | POA: Diagnosis not present

## 2020-04-14 ENCOUNTER — Other Ambulatory Visit: Payer: Self-pay

## 2020-04-14 ENCOUNTER — Encounter (HOSPITAL_BASED_OUTPATIENT_CLINIC_OR_DEPARTMENT_OTHER): Payer: Self-pay | Admitting: *Deleted

## 2020-04-14 ENCOUNTER — Emergency Department (HOSPITAL_BASED_OUTPATIENT_CLINIC_OR_DEPARTMENT_OTHER)
Admission: EM | Admit: 2020-04-14 | Discharge: 2020-04-14 | Disposition: A | Discharge status: Home / Self Care | Payer: Medicare Other | Attending: Emergency Medicine | Admitting: Emergency Medicine

## 2020-04-14 DIAGNOSIS — Z85038 Personal history of other malignant neoplasm of large intestine: Secondary | ICD-10-CM | POA: Insufficient documentation

## 2020-04-14 DIAGNOSIS — E039 Hypothyroidism, unspecified: Secondary | ICD-10-CM | POA: Insufficient documentation

## 2020-04-14 DIAGNOSIS — I10 Essential (primary) hypertension: Secondary | ICD-10-CM | POA: Diagnosis not present

## 2020-04-14 DIAGNOSIS — M7989 Other specified soft tissue disorders: Secondary | ICD-10-CM | POA: Diagnosis not present

## 2020-04-14 DIAGNOSIS — F17210 Nicotine dependence, cigarettes, uncomplicated: Secondary | ICD-10-CM | POA: Insufficient documentation

## 2020-04-14 LAB — COMPREHENSIVE METABOLIC PANEL
ALT: 11 U/L (ref 0–44)
AST: 19 U/L (ref 15–41)
Albumin: 4.1 g/dL (ref 3.5–5.0)
Alkaline Phosphatase: 80 U/L (ref 38–126)
Anion gap: 10 (ref 5–15)
BUN: 16 mg/dL (ref 8–23)
CO2: 26 mmol/L (ref 22–32)
Calcium: 9.3 mg/dL (ref 8.9–10.3)
Chloride: 102 mmol/L (ref 98–111)
Creatinine, Ser: 0.8 mg/dL (ref 0.44–1.00)
GFR calc Af Amer: >60 mL/min (ref >60)
GFR calc non Af Amer: >60 mL/min (ref >60)
Glucose, Bld: 155 mg/dL — ABNORMAL HIGH (ref 70–99)
Potassium: 3.9 mmol/L (ref 3.5–5.1)
Sodium: 138 mmol/L (ref 135–145)
Total Bilirubin: 0.4 mg/dL (ref 0.3–1.2)
Total Protein: 7.5 g/dL (ref 6.5–8.1)

## 2020-04-14 LAB — CBC WITH DIFFERENTIAL/PLATELET
Abs Immature Granulocytes: 0.05 10*3/uL (ref 0.00–0.07)
Basophils Absolute: 0.1 10*3/uL (ref 0.0–0.1)
Basophils Relative: 0 %
Eosinophils Absolute: 0.1 10*3/uL (ref 0.0–0.5)
Eosinophils Relative: 1 %
HCT: 43.7 % (ref 36.0–46.0)
Hemoglobin: 14.9 g/dL (ref 12.0–15.0)
Immature Granulocytes: 0 %
Lymphocytes Relative: 21 %
Lymphs Abs: 2.5 10*3/uL (ref 0.7–4.0)
MCH: 33.2 pg (ref 26.0–34.0)
MCHC: 34.1 g/dL (ref 30.0–36.0)
MCV: 97.3 fL (ref 80.0–100.0)
Monocytes Absolute: 0.9 10*3/uL (ref 0.1–1.0)
Monocytes Relative: 7 %
Neutro Abs: 8.3 10*3/uL — ABNORMAL HIGH (ref 1.7–7.7)
Neutrophils Relative %: 71 %
Platelets: 274 10*3/uL (ref 150–400)
RBC: 4.49 MIL/uL (ref 3.87–5.11)
RDW: 12.8 % (ref 11.5–15.5)
WBC: 11.9 10*3/uL — ABNORMAL HIGH (ref 4.0–10.5)
nRBC: 0 % (ref 0.0–0.2)

## 2020-04-14 MED ORDER — APIXABAN 2.5 MG PO TABS
ORAL_TABLET | ORAL | Status: AC
  Administered 2020-04-14: 10 mg via ORAL
  Filled 2020-04-14: qty 4

## 2020-04-14 MED ORDER — APIXABAN 2.5 MG PO TABS: 10.0000 mg | ORAL_TABLET | Freq: Once | ORAL | Status: AC

## 2020-04-14 NOTE — Discharge Instructions (Addendum)
You were evaluated in the Emergency Department and after careful evaluation, we did not find any emergent condition requiring admission or further testing in the hospital.  Your exam is suspicious for a blood clot in the leg.  Please return tomorrow morning for an ultrasound.  We have given you a single dose of blood thinner here in the emergency department.  If your ultrasound positive for blood clot, you will need a prescription for blood thinner to be taken twice daily.  Please return to the Emergency Department if you experience any worsening of your condition.  Thank you for allowing [place] to be a part of your care.

## 2020-04-14 NOTE — ED Provider Notes (Signed)
[facility] Emergency Department Provider Note MRN:  [id number]  Arrival date & time: 04/14/20     Chief Complaint   Leg Swelling   History of Present Illness   [name] is a 69 y.o. year-old female with a history of hypertension, hyperlipidemia presenting to the ED with chief complaint of leg swelling.  Worsening swelling and mild pain to the left leg for the past week.  Denies any chest pain or shortness of breath, no headache or vision change, no abdominal pain, no symptoms to the right leg.  No trauma to the left leg.  Pain worse with ambulation.  Symptoms constant.  Review of Systems  A complete 10 system review of systems was obtained and all systems are negative except as noted in the HPI and PMH.   Patient's Health History    Past Medical History:  Diagnosis Date  . Alcohol abuse, in remission   . Cancer of sigmoid colon ([place]) 07/2005   T2N0M0  . Chronic bronchitis   . Depression   . Dyspnea   . Gastritis   . GERD (gastroesophageal reflux disease)   . History of hiatal hernia   . Hyperlipidemia   . Hypertension   . Hypothyroid    no meds  . Migraines   . OA (osteoarthritis)   . PONV (postoperative nausea and vomiting)   . Reflux     Past Surgical History:  Procedure Laterality Date  . ABDOMINAL EXPOSURE N/A 11/24/2016   Procedure: ABDOMINAL EXPOSURE;  Surgeon: Rosetta Posner, MD;  Location: [place];  Service: Vascular;  Laterality: N/A;  . ABDOMINAL HYSTERECTOMY    . ANTERIOR LUMBAR FUSION Bilateral 11/24/2016   Procedure: LUMBAR 5-SACRUM 1 ANTERIOR LUMBAR INTERBODY FUSION;  Surgeon: Phylliss Bob, MD;  Location: [place];  Service: Orthopedics;  Laterality: Bilateral;  LUMBAR 5-SACRUM 1 ANTERIOR LUMBAR INTERBODY FUSION   . APPENDECTOMY    . CHOLECYSTECTOMY    . COLON RESECTION    . COLONOSCOPY    . HAND SURGERY    . repair of incisional hernia    . SHOULDER SURGERY     both shoulders  . TONSILLECTOMY      Family History    Problem Relation Age of Onset  . Heart disease Mother        Previous MI at age 52-55  . Atrial fibrillation Mother   . Stroke Mother   . Diabetes Sister   . Cancer Father 76       Prostate cancer   . Cancer Maternal Aunt        lung cancer   . Cancer Maternal Uncle        kidney ca    Social History   Socioeconomic History  . Marital status: Single    Spouse name: Not on file  . Number of children: Not on file  . Years of education: Not on file  . Highest education level: Not on file  Occupational History  . Not on file  Tobacco Use  . Smoking status: Current Every Day Smoker    Packs/day: 1.50    Types: Cigarettes  . Smokeless tobacco: Never Used  Substance and Sexual Activity  . Alcohol use: No    Comment: recovering  alcoholic  . Drug use: No  . Sexual activity: Not on file  Other Topics Concern  . Not on file  Social History Narrative  . Not on file   Social Determinants of Health   Financial  Resource Strain:   . Difficulty of Paying Living Expenses: Not on file  Food Insecurity:   . Worried About Charity fundraiser in the Last Year: Not on file  . Ran Out of Food in the Last Year: Not on file  Transportation Needs:   . Lack of Transportation (Medical): Not on file  . Lack of Transportation (Non-Medical): Not on file  Physical Activity:   . Days of Exercise per Week: Not on file  . Minutes of Exercise per Session: Not on file  Stress:   . Feeling of Stress : Not on file  Social Connections:   . Frequency of Communication with Friends and Family: Not on file  . Frequency of Social Gatherings with Friends and Family: Not on file  . Attends Religious Services: Not on file  . Active Member of Clubs or Organizations: Not on file  . Attends Archivist Meetings: Not on file  . Marital Status: Not on file  Intimate Partner Violence:   . Fear of Current or Ex-Partner: Not on file  . Emotionally Abused: Not on file  . Physically Abused: Not on  file  . Sexually Abused: Not on file     Physical Exam   Vitals:   04/14/20 1718  BP: 139/89  Pulse: 83  Resp: 18  Temp: 98.2 F (36.8 C)  SpO2: 97%    CONSTITUTIONAL: Well-appearing, NAD NEURO:  Alert and oriented x 3, no focal deficits EYES:  eyes equal and reactive ENT/NECK:  no LAD, no JVD CARDIO: Regular rate, well-perfused, normal S1 and S2 PULM:  CTAB no wheezing or rhonchi GI/GU:  normal bowel sounds, non-distended, non-tender MSK/SPINE: Asymmetric pitting edema to the left lower extremity, neurovascularly intact with normal range of motion of the joints SKIN:  no rash, atraumatic PSYCH:  Appropriate speech and behavior  *Additional and/or pertinent findings included in MDM below  Diagnostic and Interventional Summary    EKG Interpretation  Date/Time:    Ventricular Rate:    PR Interval:    QRS Duration:   QT Interval:    QTC Calculation:   R Axis:     Text Interpretation:        Labs Reviewed  COMPREHENSIVE METABOLIC PANEL - Abnormal; Notable for the following components:      Result Value   Glucose, Bld 155 (*)    All other components within normal limits  CBC WITH DIFFERENTIAL/PLATELET - Abnormal; Notable for the following components:   WBC 11.9 (*)    Neutro Abs 8.3 (*)    All other components within normal limits    US Venous Img Lower Unilateral Left    (Results Pending)    Medications  apixaban (ELIQUIS) tablet 10 mg (has no administration in time range)     Procedures  /  Critical Care Procedures  ED Course and Medical Decision Making  I have reviewed the triage vital signs, the nursing notes, and pertinent available records from the EMR.  Listed above are laboratory and imaging tests that I personally ordered, reviewed, and interpreted and then considered in my medical decision making (see below for details).  Exam suspicious for DVT, we do not have ultrasound abilities at this time.  Will empirically provide single dose Eliquis and  order ultrasound for the morning.  No chest pain or shortness of breath, normal vital signs, appropriate for discharge.       Barth Kirks. Sedonia Small, [place] [email or web address]  Final Clinical Impressions(s) / ED Diagnoses     ICD-10-CM   1. Left leg swelling  M79.89     ED Discharge Orders         Ordered    US Venous Img Lower Unilateral Left        04/14/20 2031           Discharge Instructions Discussed with and Provided to Patient:     Discharge Instructions     You were evaluated in the Emergency Department and after careful evaluation, we did not find any emergent condition requiring admission or further testing in the hospital.  Your exam is suspicious for a blood clot in the leg.  Please return tomorrow morning for an ultrasound.  We have given you a single dose of blood thinner here in the emergency department.  If your ultrasound positive for blood clot, you will need a prescription for blood thinner to be taken twice daily.  Please return to the Emergency Department if you experience any worsening of your condition.  Thank you for allowing [place] to be a part of your care.       Maudie Flakes, MD 04/14/20 2052

## 2020-04-14 NOTE — ED Triage Notes (Signed)
C/o left lower leg/ calf swelling and pain  X 1 week .

## 2020-04-15 ENCOUNTER — Ambulatory Visit (HOSPITAL_BASED_OUTPATIENT_CLINIC_OR_DEPARTMENT_OTHER)
Admission: RE | Admit: 2020-04-15 | Discharge: 2020-04-15 | Disposition: A | Discharge status: Home / Self Care | Payer: Medicare Other | Source: Ambulatory Visit | Attending: Emergency Medicine | Admitting: Emergency Medicine

## 2020-04-15 DIAGNOSIS — M7122 Synovial cyst of popliteal space [Baker], left knee: Secondary | ICD-10-CM | POA: Insufficient documentation

## 2020-04-15 DIAGNOSIS — L98492 Non-pressure chronic ulcer of skin of other sites with fat layer exposed: Secondary | ICD-10-CM | POA: Diagnosis not present

## 2020-04-15 DIAGNOSIS — F17200 Nicotine dependence, unspecified, uncomplicated: Secondary | ICD-10-CM | POA: Diagnosis not present

## 2020-04-15 DIAGNOSIS — M79662 Pain in left lower leg: Secondary | ICD-10-CM | POA: Diagnosis not present

## 2020-04-15 DIAGNOSIS — F17210 Nicotine dependence, cigarettes, uncomplicated: Secondary | ICD-10-CM | POA: Diagnosis not present

## 2020-04-15 DIAGNOSIS — M7989 Other specified soft tissue disorders: Secondary | ICD-10-CM | POA: Diagnosis not present

## 2020-04-15 NOTE — ED Provider Notes (Signed)
As result and she has a Baker's cyst but no other acute findings.  She has no signs of infection with no redness no streaking no fevers.  She is told to follow-up with her primary doctor for management of her acute Baker's cyst.  She understands this and she will come back if she has any changes or concerns.  Strict return precautions were discussed and she agrees to this plan she is safe for discharge   Breck Coons, MD 04/15/20 1005

## 2020-05-14 DIAGNOSIS — M25561 Pain in right knee: Secondary | ICD-10-CM | POA: Diagnosis not present

## 2020-05-14 DIAGNOSIS — M25562 Pain in left knee: Secondary | ICD-10-CM | POA: Diagnosis not present

## 2020-05-16 DIAGNOSIS — Z23 Encounter for immunization: Secondary | ICD-10-CM | POA: Diagnosis not present

## 2020-07-07 DIAGNOSIS — M25561 Pain in right knee: Secondary | ICD-10-CM | POA: Diagnosis not present

## 2020-07-07 DIAGNOSIS — M25562 Pain in left knee: Secondary | ICD-10-CM | POA: Diagnosis not present

## 2020-07-14 DIAGNOSIS — M170 Bilateral primary osteoarthritis of knee: Secondary | ICD-10-CM | POA: Diagnosis not present

## 2020-07-21 DIAGNOSIS — M170 Bilateral primary osteoarthritis of knee: Secondary | ICD-10-CM | POA: Diagnosis not present

## 2020-07-28 DIAGNOSIS — M170 Bilateral primary osteoarthritis of knee: Secondary | ICD-10-CM | POA: Diagnosis not present

## 2020-07-29 DIAGNOSIS — E782 Mixed hyperlipidemia: Secondary | ICD-10-CM | POA: Diagnosis not present

## 2020-07-29 DIAGNOSIS — I700 Atherosclerosis of aorta: Secondary | ICD-10-CM | POA: Diagnosis not present

## 2020-07-29 DIAGNOSIS — R600 Localized edema: Secondary | ICD-10-CM | POA: Diagnosis not present

## 2020-07-29 DIAGNOSIS — I10 Essential (primary) hypertension: Secondary | ICD-10-CM | POA: Diagnosis not present

## 2020-07-29 DIAGNOSIS — E1121 Type 2 diabetes mellitus with diabetic nephropathy: Secondary | ICD-10-CM | POA: Diagnosis not present

## 2020-07-29 DIAGNOSIS — Z7984 Long term (current) use of oral hypoglycemic drugs: Secondary | ICD-10-CM | POA: Diagnosis not present

## 2020-07-29 DIAGNOSIS — Z0000 Encounter for general adult medical examination without abnormal findings: Secondary | ICD-10-CM | POA: Diagnosis not present

## 2020-07-29 DIAGNOSIS — I2510 Atherosclerotic heart disease of native coronary artery without angina pectoris: Secondary | ICD-10-CM | POA: Diagnosis not present

## 2020-07-29 DIAGNOSIS — E559 Vitamin D deficiency, unspecified: Secondary | ICD-10-CM | POA: Diagnosis not present

## 2020-07-29 DIAGNOSIS — E039 Hypothyroidism, unspecified: Secondary | ICD-10-CM | POA: Diagnosis not present

## 2020-07-29 DIAGNOSIS — N182 Chronic kidney disease, stage 2 (mild): Secondary | ICD-10-CM | POA: Diagnosis not present

## 2020-07-29 DIAGNOSIS — Z79899 Other long term (current) drug therapy: Secondary | ICD-10-CM | POA: Diagnosis not present

## 2020-10-02 ENCOUNTER — Emergency Department (HOSPITAL_BASED_OUTPATIENT_CLINIC_OR_DEPARTMENT_OTHER)
Admission: EM | Admit: 2020-10-02 | Discharge: 2020-10-02 | Disposition: A | Discharge status: Home / Self Care | Payer: Medicare Other | Attending: Emergency Medicine | Admitting: Emergency Medicine

## 2020-10-02 ENCOUNTER — Encounter (HOSPITAL_BASED_OUTPATIENT_CLINIC_OR_DEPARTMENT_OTHER): Payer: Self-pay | Admitting: *Deleted

## 2020-10-02 ENCOUNTER — Other Ambulatory Visit: Payer: Self-pay

## 2020-10-02 DIAGNOSIS — S61411A Laceration without foreign body of right hand, initial encounter: Secondary | ICD-10-CM | POA: Diagnosis not present

## 2020-10-02 DIAGNOSIS — Y92009 Unspecified place in unspecified non-institutional (private) residence as the place of occurrence of the external cause: Secondary | ICD-10-CM | POA: Insufficient documentation

## 2020-10-02 DIAGNOSIS — W108XXA Fall (on) (from) other stairs and steps, initial encounter: Secondary | ICD-10-CM | POA: Insufficient documentation

## 2020-10-02 DIAGNOSIS — S61011A Laceration without foreign body of right thumb without damage to nail, initial encounter: Secondary | ICD-10-CM | POA: Insufficient documentation

## 2020-10-02 DIAGNOSIS — F17210 Nicotine dependence, cigarettes, uncomplicated: Secondary | ICD-10-CM | POA: Diagnosis not present

## 2020-10-02 DIAGNOSIS — I10 Essential (primary) hypertension: Secondary | ICD-10-CM | POA: Diagnosis not present

## 2020-10-02 DIAGNOSIS — E039 Hypothyroidism, unspecified: Secondary | ICD-10-CM | POA: Diagnosis not present

## 2020-10-02 DIAGNOSIS — S01512A Laceration without foreign body of oral cavity, initial encounter: Secondary | ICD-10-CM | POA: Diagnosis not present

## 2020-10-02 DIAGNOSIS — Z23 Encounter for immunization: Secondary | ICD-10-CM | POA: Diagnosis not present

## 2020-10-02 DIAGNOSIS — S0181XA Laceration without foreign body of other part of head, initial encounter: Secondary | ICD-10-CM | POA: Insufficient documentation

## 2020-10-02 DIAGNOSIS — S51811A Laceration without foreign body of right forearm, initial encounter: Secondary | ICD-10-CM | POA: Insufficient documentation

## 2020-10-02 DIAGNOSIS — Z79899 Other long term (current) drug therapy: Secondary | ICD-10-CM | POA: Diagnosis not present

## 2020-10-02 DIAGNOSIS — Z7984 Long term (current) use of oral hypoglycemic drugs: Secondary | ICD-10-CM | POA: Diagnosis not present

## 2020-10-02 DIAGNOSIS — S61001A Unspecified open wound of right thumb without damage to nail, initial encounter: Secondary | ICD-10-CM | POA: Diagnosis not present

## 2020-10-02 DIAGNOSIS — Z85038 Personal history of other malignant neoplasm of large intestine: Secondary | ICD-10-CM | POA: Insufficient documentation

## 2020-10-02 DIAGNOSIS — S0993XA Unspecified injury of face, initial encounter: Secondary | ICD-10-CM | POA: Diagnosis present

## 2020-10-02 DIAGNOSIS — S01419A Laceration without foreign body of unspecified cheek and temporomandibular area, initial encounter: Secondary | ICD-10-CM | POA: Diagnosis not present

## 2020-10-02 MED ORDER — LIDOCAINE HCL 1 % IJ SOLN
20.0000 mL | Freq: Once | INTRAMUSCULAR | Status: AC
  Administered 2020-10-02: 20 mL

## 2020-10-02 MED ORDER — TETANUS-DIPHTH-ACELL PERTUSSIS 5-2.5-18.5 LF-MCG/0.5 IM SUSY
0.5000 mL | PREFILLED_SYRINGE | Freq: Once | INTRAMUSCULAR | Status: AC
  Administered 2020-10-02: 0.5 mL via INTRAMUSCULAR
  Filled 2020-10-02: qty 0.5

## 2020-10-02 MED ORDER — LIDOCAINE HCL (PF) 1 % IJ SOLN
30.0000 mL | Freq: Once | INTRAMUSCULAR | Status: DC
  Filled 2020-10-02: qty 30

## 2020-10-02 NOTE — ED Provider Notes (Addendum)
[facility] EMERGENCY DEPARTMENT Provider Note   CSN: [id number] Arrival date & time: 10/02/20  1304     History Chief Complaint  Patient presents with  . Laceration    [name] is a 70 y.o. female with a history of pretension, hyperlipidemia.  Patient presents with a chief complaint of multiple lacerations due to a fall.  Patient reports that she tripped going up one step onto her porch.  Patient reports that she fell forward and hit her chin on a table.  Patient was able to ambulate after her fall.  Patient denies any loss of consciousness.  Reports that she has a history of low back pain at baseline.  Patient denies any neck pain, or any new back pain.  Patient denies any numbness or tingling to extremities, weakness of extremities, saddle anesthesia, bowel or bladder dysfunction.   Patient reports that all bleeding was controlled with direct pressure.  Patient denies taking any blood thinners.  She denies any drug or alcohol use.  Patient denies any pain.  Patient is unsure when her last tetanus shot was.   Patient has laceration to her chin, right thumb, right palm, and right forearm.   Patient is right-hand dominant.  HPI     Past Medical History:  Diagnosis Date  . Alcohol abuse, in remission   . Cancer of sigmoid colon ([place]) 07/2005   T2N0M0  . Chronic bronchitis   . Depression   . Dyspnea   . Gastritis   . GERD (gastroesophageal reflux disease)   . History of hiatal hernia   . Hyperlipidemia   . Hypertension   . Hypothyroid    no meds  . Migraines   . OA (osteoarthritis)   . PONV (postoperative nausea and vomiting)   . Reflux     Patient Active Problem List   Diagnosis Date Noted  . Radiculopathy 11/24/2016  . H/O colon cancer, stage I 01/19/2013  . Nonspecific abnormal electrocardiogram (ECG) (EKG) 03/31/2011  . Chest pain 03/31/2011  . Hyperlipidemia 03/31/2011  . Hypertension 03/31/2011  . Tobacco abuse 03/31/2011    Past Surgical  History:  Procedure Laterality Date  . ABDOMINAL EXPOSURE N/A 11/24/2016   Procedure: ABDOMINAL EXPOSURE;  Surgeon: Rosetta Posner, MD;  Location: [facility];  Service: Vascular;  Laterality: N/A;  . ABDOMINAL HYSTERECTOMY    . ANTERIOR LUMBAR FUSION Bilateral 11/24/2016   Procedure: LUMBAR 5-SACRUM 1 ANTERIOR LUMBAR INTERBODY FUSION;  Surgeon: Phylliss Bob, MD;  Location: [facility];  Service: Orthopedics;  Laterality: Bilateral;  LUMBAR 5-SACRUM 1 ANTERIOR LUMBAR INTERBODY FUSION   . APPENDECTOMY    . CHOLECYSTECTOMY    . COLON RESECTION    . COLONOSCOPY    . HAND SURGERY    . repair of incisional hernia    . SHOULDER SURGERY     both shoulders  . TONSILLECTOMY       OB History   No obstetric history on file.     Family History  Problem Relation Age of Onset  . Heart disease Mother        Previous MI at age 54-55  . Atrial fibrillation Mother   . Stroke Mother   . Diabetes Sister   . Cancer Father 47       Prostate cancer   . Cancer Maternal Aunt        lung cancer   . Cancer Maternal Uncle        kidney ca    Social History  Tobacco Use  . Smoking status: Current Every Day Smoker    Packs/day: 1.50    Types: Cigarettes  . Smokeless tobacco: Never Used  Substance Use Topics  . Alcohol use: No    Comment: recovering  alcoholic  . Drug use: No    Home Medications Prior to Admission medications   Medication Sig Start Date End Date Taking? Authorizing Provider  amitriptyline (ELAVIL) 25 MG tablet 4 tablets 02/20/20  Yes [provider]  amLODipine (NORVASC) 10 MG tablet Take 1 tablet by mouth daily. 12/20/19  Yes [provider]  ARIPiprazole (ABILIFY) 15 MG tablet Take 1 tablet by mouth daily. 02/12/20  Yes [provider]  furosemide (LASIX) 20 MG tablet 1 tablet 07/30/20  Yes [provider]  metFORMIN (GLUCOPHAGE-XR) 500 MG 24 hr tablet 1 tablet for 7 days, then 2 tablets with evening meal 07/31/20  Yes [provider]   PARoxetine (PAXIL) 20 MG tablet 1 tablet in the morning 02/11/20  Yes [provider]  rosuvastatin (CRESTOR) 5 MG tablet Take 1 tablet by mouth at bedtime. 01/11/20  Yes [provider]  ALPRAZolam Duanne Moron) 0.5 MG tablet 1 tablet 02/24/20   [provider]  amitriptyline (ELAVIL) 25 MG tablet Take 100 mg by mouth at bedtime.      [provider]  amLODipine (NORVASC) 5 MG tablet Take 5 mg by mouth at bedtime. 09/04/16   [provider]  calcium carbonate (TUMS - DOSED IN MG ELEMENTAL CALCIUM) 500 MG chewable tablet Chew 2 tablets by mouth 4 (four) times daily as needed for indigestion or heartburn.    [provider]  Cholecalciferol (VITAMIN D) 2000 units CAPS Take 2,000 Units by mouth at bedtime.    [provider]  Cyanocobalamin (VITAMIN B-12) 5000 MCG TBDP Take 5,000 mcg by mouth at bedtime.    [provider]  Cyanocobalamin (VITAMIN B12) 1000 MCG TBCR 1 tablet    [provider]  gabapentin (NEURONTIN) 300 MG capsule Take 300 mg by mouth See admin instructions. PATIENT TYPICALLY TAKES TWICE DAILY--BUT WILL TAKE THE 3RD DOSE OCCASIONALLY 08/23/16   [provider]  HYDROmorphone (DILAUDID) 2 MG tablet Take 0.5-1 tablets (1-2 mg total) by mouth every 4 (four) hours as needed for severe pain. 11/25/16   Phylliss Bob, MD  meloxicam (MOBIC) 15 MG tablet 1 tablet    [provider]  ranitidine (ZANTAC) 150 MG tablet Take 150 mg by mouth daily as needed (for acid reflux/heartburn).     [provider]  vitamin E 1000 UNIT capsule Take 1,000 Units by mouth at bedtime.    [provider]    Allergies    Benadryl [diphenhydramine hcl], Lansoprazole, Morphine sulfate, Penicillins, Red dye, Simvastatin, Codeine, Shellfish allergy, and Sulfa antibiotics  Review of Systems   Review of Systems  Gastrointestinal: Negative for nausea and vomiting.  Genitourinary: Negative for difficulty  urinating.  Musculoskeletal: Positive for back pain (low back pain baseline for patient). Negative for neck pain.  Skin: Positive for wound. Negative for color change, pallor and rash.  Neurological: Negative for dizziness, syncope, weakness and numbness.  Psychiatric/Behavioral: Negative for confusion.    Physical Exam Updated Vital Signs BP (!) 131/112 (BP Location: Left Arm)   Pulse 88   Temp 98.8 F (37.1 C) (Oral)   Resp 20   Ht 5\' 5"  (1.651 m)   Wt 78 kg   SpO2 97%   BMI 28.62 kg/m   Physical Exam Vitals and  nursing note reviewed.  Constitutional:      General: She is not in acute distress.    Appearance: She is not ill-appearing, toxic-appearing or diaphoretic.  HENT:     Head: Normocephalic. Laceration present. No raccoon eyes, Battle's sign, abrasion, contusion or masses.     Jaw: No trismus, tenderness or pain on movement.     Comments: 3 cm linear laceration noted to patient's chin; wound communicates with patient oral cavity, all bleeding controlled    Mouth/Throat:     Mouth: Mucous membranes are moist. Lacerations present.     Pharynx: Uvula midline.     Comments: 2 cm laceration noted below frenulum Tenderness to gums below teeth 26-23; denies any dental tenderness as patient reports she has a bridge in place Bruising noted to gums below teeth 26-23 Eyes:     General: No scleral icterus.       Right eye: No discharge.        Left eye: No discharge.     Extraocular Movements: Extraocular movements intact.     Pupils: Pupils are equal, round, and reactive to light.  Cardiovascular:     Rate and Rhythm: Normal rate.  Pulmonary:     Effort: Pulmonary effort is normal.  Abdominal:     Palpations: Abdomen is soft.     Tenderness: There is no abdominal tenderness.  Musculoskeletal:     Cervical back: Normal range of motion and neck supple. No swelling, edema, deformity, erythema, signs of trauma, lacerations, rigidity, spasms, torticollis, tenderness or bony  tenderness. No pain with movement. Normal range of motion.     Thoracic back: No swelling, edema, deformity, signs of trauma, lacerations, spasms, tenderness or bony tenderness.     Lumbar back: No swelling, edema, deformity, signs of trauma, lacerations, spasms, tenderness or bony tenderness.     Right lower leg: No edema.     Left lower leg: No edema.     Comments: +3 right radial pulse, cap refill less than 2 in all digits of right hand, full extension and flexion all digits of right hand, sensation to all digits of right hand  2 cm L-shaped laceration to patient's right forearm, all hemorrhage controlled  1 cm laceration noted to patient's right palm, ragged edges  1 cm laceration noted to patient's right thumb below IP joint; ragged edges and skin flap noted  Skin:    General: Skin is warm and dry.  Neurological:     General: No focal deficit present.     Mental Status: She is alert.     GCS: GCS eye subscore is 4. GCS verbal subscore is 5. GCS motor subscore is 6.     Cranial Nerves: No cranial nerve deficit or facial asymmetry.     Sensory: Sensation is intact.     Motor: No weakness, tremor, seizure activity or pronator drift.     Gait: Gait is intact. Gait normal.     Comments: CN II-XII intact, equal grip strength, +5 strength to bilateral upper and lower extremities     Psychiatric:        Behavior: Behavior is cooperative.             ED Results / Procedures / Treatments   Labs (all labs ordered are listed, but only abnormal results are displayed) Labs Reviewed - No data to display  EKG None  Radiology No results found.  Procedures .Marland KitchenLaceration Repair  Date/Time: 10/02/2020 5:21 PM Performed by: Loni Beckwith, PA-C Authorized  by: Loni Beckwith, PA-C   Consent:    Consent obtained:  Verbal   Consent given by:  Patient   Risks discussed:  Infection, need for additional repair, pain, poor cosmetic result and poor wound healing    Alternatives discussed:  No treatment and delayed treatment Universal protocol:    Procedure explained and questions answered to patient or proxy's satisfaction: yes     Site/side marked: yes     Patient identity confirmed:  Verbally with patient and arm band Anesthesia:    Anesthesia method:  Local infiltration and nerve block   Block location:  Mental    Block needle gauge:  25 G   Block anesthetic:  Lidocaine 1% w/o epi   Block injection procedure:  Anatomic landmarks identified, introduced needle, incremental injection and negative aspiration for blood   Block outcome:  Anesthesia achieved Laceration details:    Location:  Mouth   Length (cm):  2   Depth (mm):  5 Pre-procedure details:    Preparation:  Patient was prepped and draped in usual sterile fashion Exploration:    Hemostasis achieved with:  Direct pressure   Wound exploration: entire depth of wound visualized     Contaminated: no   Skin repair:    Repair method:  Sutures   Suture size:  5-0   Wound skin closure material used: Vicryl.   Suture technique:  Simple interrupted   Number of sutures:  3 Approximation:    Approximation:  Loose Post-procedure details:    Dressing:  Open (no dressing)   Procedure completion:  Tolerated well, no immediate complications .Marland KitchenLaceration Repair  Date/Time: 10/02/2020 5:24 PM Performed by: Loni Beckwith, PA-C Authorized by: Loni Beckwith, PA-C   Consent:    Consent obtained:  Verbal   Consent given by:  Patient   Risks discussed:  Infection, need for additional repair, pain, poor cosmetic result and poor wound healing   Alternatives discussed:  No treatment and delayed treatment Universal protocol:    Procedure explained and questions answered to patient or proxy's satisfaction: yes     Patient identity confirmed:  Verbally with patient and arm band Anesthesia:    Anesthesia method:  Local infiltration and nerve block   Block location:  Mental   Block needle  gauge:  25 G   Block anesthetic:  Lidocaine 1% w/o epi   Block injection procedure:  Anatomic landmarks identified, introduced needle, incremental injection and negative aspiration for blood   Block outcome:  Anesthesia achieved Laceration details:    Location:  Face   Face location:  Chin   Length (cm):  3   Depth (mm):  10 Pre-procedure details:    Preparation:  Patient was prepped and draped in usual sterile fashion Exploration:    Hemostasis achieved with:  Direct pressure   Wound exploration: entire depth of wound visualized     Wound extent: no foreign bodies/material noted     Contaminated: no   Treatment:    Area cleansed with:  Povidone-iodine   Amount of cleaning:  Standard Skin repair:    Repair method:  Sutures   Suture size:  5-0   Suture material:  Prolene   Suture technique:  Simple interrupted   Number of sutures:  5 Approximation:    Approximation:  Close Repair type:    Repair type:  Simple Post-procedure details:    Dressing:  Sterile dressing   Procedure completion:  Tolerated well, no immediate complications .Marland KitchenLaceration Repair  Date/Time:  10/02/2020 5:26 PM Performed by: Loni Beckwith, PA-C Authorized by: Loni Beckwith, PA-C   Consent:    Consent obtained:  Verbal   Consent given by:  Patient   Risks discussed:  Infection, need for additional repair, pain, poor cosmetic result and poor wound healing   Alternatives discussed:  No treatment and delayed treatment Universal protocol:    Procedure explained and questions answered to patient or proxy's satisfaction: yes     Patient identity confirmed:  Verbally with patient and arm band Anesthesia:    Anesthesia method:  Local infiltration   Local anesthetic:  Lidocaine 1% w/o epi Laceration details:    Location:  Shoulder/arm   Shoulder/arm location:  R lower arm   Length (cm):  2   Depth (mm):  5 Pre-procedure details:    Preparation:  Patient was prepped and draped in usual sterile  fashion Exploration:    Hemostasis achieved with:  Direct pressure   Wound extent: no foreign bodies/material noted     Contaminated: no   Treatment:    Area cleansed with:  Povidone-iodine   Amount of cleaning:  Standard Skin repair:    Repair method:  Sutures   Suture size:  4-0   Suture material:  Prolene   Suture technique:  Simple interrupted   Number of sutures:  3 Approximation:    Approximation:  Close Repair type:    Repair type:  Simple Post-procedure details:    Dressing:  Sterile dressing   Procedure completion:  Tolerated well, no immediate complications .Marland KitchenLaceration Repair  Date/Time: 10/02/2020 6:33 PM Performed by: Loni Beckwith, PA-C Authorized by: Loni Beckwith, PA-C   Consent:    Consent obtained:  Verbal   Consent given by:  Patient   Risks discussed:  Infection, need for additional repair, pain, poor cosmetic result and poor wound healing   Alternatives discussed:  No treatment and delayed treatment Universal protocol:    Procedure explained and questions answered to patient or proxy's satisfaction: yes     Patient identity confirmed:  Verbally with patient and arm band Anesthesia:    Anesthesia method:  Local infiltration   Local anesthetic:  Lidocaine 1% w/o epi Laceration details:    Location:  Hand   Hand location:  R palm   Length (cm):  1   Depth (mm):  2 Pre-procedure details:    Preparation:  Patient was prepped and draped in usual sterile fashion Exploration:    Hemostasis achieved with:  Direct pressure   Wound exploration: entire depth of wound visualized     Wound extent: no foreign bodies/material noted   Treatment:    Area cleansed with:  Povidone-iodine   Amount of cleaning:  Standard Skin repair:    Repair method:  Sutures   Suture size:  4-0   Suture material:  Prolene   Suture technique:  Simple interrupted   Number of sutures:  3 Approximation:    Approximation:  Close Repair type:    Repair type:   Simple Post-procedure details:    Dressing:  Bulky dressing   Procedure completion:  Tolerated well, no immediate complications .Marland KitchenLaceration Repair  Date/Time: 10/02/2020 6:35 PM Performed by: Loni Beckwith, PA-C Authorized by: Loni Beckwith, PA-C   Consent:    Consent obtained:  Verbal   Consent given by:  Patient   Risks discussed:  Infection, need for additional repair, pain, poor cosmetic result and poor wound healing   Alternatives discussed:  No treatment and  delayed treatment Universal protocol:    Procedure explained and questions answered to patient or proxy's satisfaction: yes     Patient identity confirmed:  Verbally with patient and arm band Anesthesia:    Anesthesia method:  Local infiltration   Local anesthetic:  Lidocaine 1% w/o epi Laceration details:    Location:  Finger   Finger location:  R thumb   Length (cm):  1   Depth (mm):  1 Pre-procedure details:    Preparation:  Patient was prepped and draped in usual sterile fashion Exploration:    Wound extent: no foreign bodies/material noted, no nerve damage noted, no tendon damage noted and no vascular damage noted   Treatment:    Area cleansed with:  Povidone-iodine   Amount of cleaning:  Standard Skin repair:    Repair method:  Sutures   Suture size:  4-0   Suture material:  Prolene   Suture technique:  Simple interrupted   Number of sutures:  3 Approximation:    Approximation:  Close Repair type:    Repair type:  Simple Post-procedure details:    Dressing:  Bulky dressing   Procedure completion:  Tolerated well, no immediate complications    Medications Ordered in ED Medications  Tdap (BOOSTRIX) injection 0.5 mL (0.5 mLs Intramuscular Given 10/02/20 1422)  lidocaine (XYLOCAINE) 1 % (with pres) injection 20 mL (20 mLs Infiltration Given by Other 10/02/20 1456)    ED Course  I have reviewed the triage vital signs and the nursing notes.  Pertinent labs & imaging results that were  available during my care of the patient were reviewed by me and considered in my medical decision making (see chart for details).    MDM Rules/Calculators/A&P                          Alert 70 year old female no acute distress, nontoxic appearing.  Patient presents with chief complaint of multiple lacerations due to a fall.  Fall was mechanical in nature, patient did not lose consciousness.  Patient is not on any blood thinners.  Patient denies any alcohol or illicit drug use.  No tender spinous process, deformity, step-off, or midline tenderness to cervical, thoracic, or lumbar back.  CN II-XII intact, equal grip strength, +5 strength to bilateral upper and lower extremities.  Patient denies any neck pain, back pain, nausea, vomiting, saddle anesthesia, bowel or bladder dysfunction, numbness or tingling to extremities, weakness to extremities.  Low suspicion for intracranial hemorrhage or C-spine injury.  Patient is unsure when her last tetanus shot was.  No documentation could be found in chart review.  Patient was given tetanus booster.  Multiple lacerations from patient's fall.  See physical exam for further documentation and photographs.  Laceration repair as noted in procedure notes.  Discussed findings, treatment and follow up. Patient advised of return precautions. Patient verbalized understanding and agreed with plan.    Final Clinical Impression(s) / ED Diagnoses Final diagnoses:  Laceration of right thumb without foreign body without damage to nail, initial encounter  Laceration of right hand without foreign body, initial encounter  Laceration of right forearm, initial encounter  Chin laceration, initial encounter  Laceration of oral cavity, initial encounter    Rx / DC Orders ED Discharge Orders    None       Loni Beckwith, PA-C 10/02/20 2147    Loni Beckwith, PA-C 10/02/20 2147    Loni Beckwith, PA-C 10/02/20 2223  Sherwood Gambler,  MD 10/03/20 [phone]

## 2020-10-02 NOTE — Discharge Instructions (Addendum)
You came to the emergency department to be evaluated for your injuries for suffering a fall. Your physical exam was reassuring. You received multiple sutures to different lacerations. Please keep all of the wounds dry for the next 24 hours. After this you may wash the areas with soap and water. Please do not submerge any of the wounds under water.  Please have the stitches on your chin removed in 3 to 5 days.  Please have the stitches on your forearm removed in 7 to 10 days.  Please have the stitches on your hand and thumb removed and 10 to 14 days.  The stitches inside your mouth are dissolvable and will disappear over time. These perform salt water rinses and gargles after eating meals to help prevent any food from accumulating in the laceration within your mouth.  You may go to your primary care provider, urgent care, or return to the emergency department to have these sutures removed.  Get help right away if: You develop severe swelling around the wound. Your pain suddenly increases and is severe. You develop painful lumps near the wound or on skin anywhere else on your body. You have a red streak going away from your wound. The wound is on your hand or foot and you cannot properly move a finger or toe. The wound is on your hand or foot, and you notice that your fingers or toes look pale or bluish.

## 2020-10-02 NOTE — ED Triage Notes (Signed)
She tripped and fell this am. Laceration to her chin, right thumb and right wrist. Bleeding controlled. She is ambulatory.

## 2020-10-07 DIAGNOSIS — S0181XA Laceration without foreign body of other part of head, initial encounter: Secondary | ICD-10-CM | POA: Diagnosis not present

## 2020-10-07 DIAGNOSIS — I2510 Atherosclerotic heart disease of native coronary artery without angina pectoris: Secondary | ICD-10-CM | POA: Diagnosis not present

## 2020-10-07 DIAGNOSIS — R600 Localized edema: Secondary | ICD-10-CM | POA: Diagnosis not present

## 2020-10-13 DIAGNOSIS — S41111D Laceration without foreign body of right upper arm, subsequent encounter: Secondary | ICD-10-CM | POA: Diagnosis not present

## 2020-10-17 DIAGNOSIS — M170 Bilateral primary osteoarthritis of knee: Secondary | ICD-10-CM | POA: Diagnosis not present

## 2020-10-23 NOTE — Progress Notes (Unsigned)
Cardiology Office Note:   Date:  10/24/2020  NAME:  [name]    MRN: [id number] DOB:  [birth year]   PCP:  Jonathon Jordan, MD  Cardiologist:  No primary care provider on file.   Referring MD: Jonathon Jordan, MD   Chief Complaint  Patient presents with  . Shortness of Breath   History of Present Illness:   [name] is a 70 y.o. female with a hx of HTN, HLD, tobacco abuse who is being seen today for the evaluation of coronary calcifications at the request of Jonathon Jordan, MD.  CT scan shows minimal calcifications in the LAD.  She has a 50-pack-year history of smoking.  Recent CT lung cancer screening shows emphysema and minimal calcifications in the LAD territory.  She was sent for further evaluation.  She also has lower extremity edema.  She reports this is worse at the end of the day.  She has great pulses in the lower extremities.  This is inconsistent with PAD.  She is not active but does get short of breath with exertion.  She reports activity can get her feeling tired and fatigued.  She is not tried any inhalers.  She does have emphysema seen on her recent CT scan.  She is diabetic.  Her most recent cholesterol levels are not at goal.  I recommended to increase her Crestor to 20 mg daily.  She is okay to do this.  She does have a family history of heart disease.  She reports no tightness or chest pressure.  She does not have any symptoms concerning for angina.  Her EKG in office shows normal sinus rhythm with no acute ischemic changes or evidence of infarction.  She reports the lower extremity edema in her legs does bother her.  She also reports her shortness of breath is bothersome.  She is a recovering alcoholic and no longer drinks.  She has a partner.  No children.  She still works in [employer] adjust[employer], [employer].  Symptoms of shortness of breath and edema occur daily.  They do improve in the morning and are worse in the evenings.  Problem List 1. DM -A1c 7.3 2. HTN 3. HLD -T chol 185, HDL  51, LDL 95, TG 233 4. Tobacco abuse -53 years  5. Coronary calcification on CT scan  -minimal LAD calcifications   Past Medical History: Past Medical History:  Diagnosis Date  . Alcohol abuse, in remission   . Cancer of sigmoid colon ([place]) 07/2005   T2N0M0  . Chronic bronchitis   . Depression   . Dyspnea   . Gastritis   . GERD (gastroesophageal reflux disease)   . History of hiatal hernia   . Hyperlipidemia   . Hypertension   . Hypothyroid    no meds  . Migraines   . OA (osteoarthritis)   . PONV (postoperative nausea and vomiting)   . Reflux     Past Surgical History: Past Surgical History:  Procedure Laterality Date  . ABDOMINAL EXPOSURE N/A 11/24/2016   Procedure: ABDOMINAL EXPOSURE;  Surgeon: Rosetta Posner, MD;  Location: [facility];  Service: Vascular;  Laterality: N/A;  . ABDOMINAL HYSTERECTOMY    . ANTERIOR LUMBAR FUSION Bilateral 11/24/2016   Procedure: LUMBAR 5-SACRUM 1 ANTERIOR LUMBAR INTERBODY FUSION;  Surgeon: Phylliss Bob, MD;  Location: [place] [facility];  Service: Orthopedics;  Laterality: Bilateral;  LUMBAR 5-SACRUM 1 ANTERIOR LUMBAR INTERBODY FUSION   . APPENDECTOMY    . CHOLECYSTECTOMY    . COLON RESECTION    .  COLONOSCOPY    . HAND SURGERY    . repair of incisional hernia    . SHOULDER SURGERY     both shoulders  . TONSILLECTOMY      Current Medications: Current Meds  Medication Sig  . albuterol (VENTOLIN HFA) 108 (90 Base) MCG/ACT inhaler Inhale 2 puffs into the lungs every 6 (six) hours as needed for wheezing or shortness of breath.  . ALPRAZolam (XANAX) 0.5 MG tablet 1 tablet  . amitriptyline (ELAVIL) 25 MG tablet Take 100 mg by mouth at bedtime.  . ARIPiprazole (ABILIFY) 15 MG tablet Take 1 tablet by mouth daily.  . calcium carbonate (TUMS - DOSED IN MG ELEMENTAL CALCIUM) 500 MG chewable tablet Chew 2 tablets by mouth 4 (four) times daily as needed for indigestion or heartburn.  . Cholecalciferol (VITAMIN D) 2000 units CAPS Take 2,000 Units by mouth at  bedtime.  . Cyanocobalamin (VITAMIN B-12) 5000 MCG TBDP Take 5,000 mcg by mouth at bedtime.  Marland Kitchen losartan (COZAAR) 25 MG tablet Take 1 tablet (25 mg total) by mouth daily.  . metFORMIN (GLUCOPHAGE-XR) 500 MG 24 hr tablet 1,000 mg.  . PARoxetine (PAXIL) 20 MG tablet 1 tablet in the morning  . ranitidine (ZANTAC) 150 MG tablet Take 150 mg by mouth daily as needed (for acid reflux/heartburn).   . vitamin E 1000 UNIT capsule Take 1,000 Units by mouth at bedtime.  . [DISCONTINUED] amLODipine (NORVASC) 10 MG tablet Take 1 tablet by mouth daily.  . [DISCONTINUED] rosuvastatin (CRESTOR) 5 MG tablet Take 1 tablet by mouth at bedtime.     Allergies:    Benadryl [diphenhydramine hcl], Lansoprazole, Morphine sulfate, Penicillins, Red dye, Simvastatin, Codeine, Shellfish allergy, and Sulfa antibiotics   Social History: Social History   Socioeconomic History  . Marital status: Single    Spouse name: Not on file  . Number of children: 0  . Years of education: Not on file  . Highest education level: Not on file  Occupational History  . Occupation: pet care  Tobacco Use  . Smoking status: Current Every Day Smoker    Packs/day: 1.50    Years: 50.00    Pack years: 75.00    Types: Cigarettes  . Smokeless tobacco: Never Used  Substance and Sexual Activity  . Alcohol use: No    Comment: recovering  alcoholic  . Drug use: No  . Sexual activity: Not on file  Other Topics Concern  . Not on file  Social History Narrative  . Not on file   Social Determinants of Health   Financial Resource Strain: Not on file  Food Insecurity: Not on file  Transportation Needs: Not on file  Physical Activity: Not on file  Stress: Not on file  Social Connections: Not on file     Family History: The patient's family history includes Arrhythmia in her sister; Atrial fibrillation in her mother; Cancer in her maternal aunt and maternal uncle; Cancer (age of onset: 51) in her father; Diabetes in her sister; Heart  disease in her mother; Heart failure in her mother; Stroke in her mother.  ROS:   All other ROS reviewed and negative. Pertinent positives noted in the HPI.     EKGs/Labs/Other Studies Reviewed:   The following studies were personally reviewed by me today:  EKG:  EKG is ordered today.  The ekg ordered today demonstrates normal sinus rhythm heart rate 76, no acute ischemic changes or evidence of infarction, and was personally reviewed by me.   Recent Labs: 04/14/2020:  ALT 11; BUN 16; Creatinine, Ser 0.80; Hemoglobin 14.9; Platelets 274; Potassium 3.9; Sodium 138   Recent Lipid Panel No results found for: CHOL, TRIG, HDL, CHOLHDL, VLDL, LDLCALC, LDLDIRECT  Physical Exam:   VS:  BP 134/72 (BP Location: Left Arm, Patient Position: Sitting, Cuff Size: Normal)   Pulse 62   Ht 5\' 4"  (1.626 m)   Wt 178 lb (80.7 kg)   BMI 30.55 kg/m    Wt Readings from Last 3 Encounters:  10/24/20 178 lb (80.7 kg)  10/02/20 171 lb 15.3 oz (78 kg)  04/14/20 172 lb (78 kg)    General: Well nourished, well developed, in no acute distress Head: Atraumatic, normal size  Eyes: PEERLA, EOMI  Neck: Supple, no JVD Endocrine: No thryomegaly Cardiac: Normal S1, S2; RRR; no murmurs, rubs, or gallops Lungs: Diminished breath sounds, rhonchi noted in the upper lung fields Abd: Soft, nontender, no hepatomegaly  Ext: Trace edema, 2+ bounding pulses in the lower extremities Musculoskeletal: No deformities, BUE and BLE strength normal and equal Skin: Warm and dry, no rashes   Neuro: Alert and oriented to person, place, time, and situation, CNII-XII grossly intact, no focal deficits  Psych: Normal mood and affect   ASSESSMENT:   [name] is a 70 y.o. female who presents for the following: 1. SOB (shortness of breath) on exertion   2. Edema, unspecified type   3. Coronary artery calcification seen on CAT scan   4. Mixed hyperlipidemia     PLAN:   1. SOB (shortness of breath) on exertion -50-pack-year  history.  Recent CT scan shows emphysema.  She is still smoking.  She has diminished breath sounds on examination with rhonchi in the upper lung fields.  Her EKG is normal.  She has no findings consistent with congestive heart failure.  We will exclude this with a BNP and echocardiogram.  I hear no murmurs to suggest any underlying valvular heart disease.  I highly suspect all of her symptoms are related to COPD.  We will start her on albuterol as needed.  We also will obtain PFTs in addition to a BNP and echo.  She reports no exertional chest pain or pressure.  I see no need for stress test at this time.  Her CT scan showed minimal calcifications in the LAD.  If symptoms do not improve with albuterol or the work-up is unremarkable we may consider a stress test.  Her EKG shows sinus rhythm and is really not alarming at all.  It is very normal.  This is reassuring.  2. Edema, unspecified type -Trace edema on examination.  She is on amlodipine.  We will stop amlodipine.  This could be the cause.  We will switch her to losartan 25 mg daily.  We will check a BNP and echo.  She has no JVD.  I do not think this is heart failure.  She has excellent 2+ pulses in the lower extremity.  She does not have PAD.  3. Coronary artery calcification seen on CAT scan 4. Mixed hyperlipidemia -Minimal calcifications in the LAD on review of her CT scan.  She does have significant mitral annular calcification but this is not coronary calcifications.  We will increase her Crestor to 20 mg daily.  Given her diabetes and calcifications she needs an LDL less than 70 and triglycerides less than 150.  We will plan to recheck this at some point.   Disposition: Return in about 2 months (around 12/24/2020).  Medication Adjustments/Labs and  Tests Ordered: Current medicines are reviewed at length with the patient today.  Concerns regarding medicines are outlined above.  Orders Placed This Encounter  Procedures  . Brain natriuretic  peptide  . EKG 12-Lead  . ECHOCARDIOGRAM COMPLETE  . Pulmonary Function Test   Meds ordered this encounter  Medications  . losartan (COZAAR) 25 MG tablet    Sig: Take 1 tablet (25 mg total) by mouth daily.    Dispense:  90 tablet    Refill:  3  . rosuvastatin (CRESTOR) 20 MG tablet    Sig: Take 1 tablet (20 mg total) by mouth at bedtime.    Dispense:  90 tablet    Refill:  1  . albuterol (VENTOLIN HFA) 108 (90 Base) MCG/ACT inhaler    Sig: Inhale 2 puffs into the lungs every 6 (six) hours as needed for wheezing or shortness of breath.    Dispense:  8 g    Refill:  2    Patient Instructions  Medication Instructions:  Stop Amlodipine  Start Losartan 25 mg daily  Increase Crestor 20 mg daily  Start Albuterol inhaler every 6 hours as needed  *If you need a refill on your cardiac medications before your next appointment, please call your pharmacy*   Lab Work: BNP today  If you have labs (blood work) drawn today and your tests are completely normal, you will receive your results only by: Marland Kitchen MyChart Message (if you have MyChart) OR . A paper copy in the mail If you have any lab test that is abnormal or we need to change your treatment, we will call you to review the results.   Testing/Procedures: Echocardiogram - Your physician has requested that you have an echocardiogram. Echocardiography is a painless test that uses sound waves to create images of your heart. It provides your doctor with information about the size and shape of your heart and how well your heart's chambers and valves are working. This procedure takes approximately one hour. There are no restrictions for this procedure. This will be performed at our [facility] location - [address], [address].  Your physician has recommended that you have a pulmonary function test. Pulmonary Function Tests are a group of tests that measure how well air moves in and out of your lungs.   Follow-Up: At [facility], you  and your health needs are our priority.  As part of our continuing mission to provide you with exceptional heart care, we have created designated Provider Care Teams.  These Care Teams include your primary Cardiologist (physician) and Advanced Practice Providers (APPs -  Physician Assistants and Nurse Practitioners) who all work together to provide you with the care you need, when you need it.  We recommend signing up for the patient portal called "MyChart".  Sign up information is provided on this After Visit Summary.  MyChart is used to connect with patients for Virtual Visits (Telemedicine).  Patients are able to view lab/test results, encounter notes, upcoming appointments, etc.  Non-urgent messages can be sent to your provider as well.   To learn more about what you can do with MyChart, go to [email or web address].    Your next appointment:   2 month(s)  The format for your next appointment:   In Person  Provider:   Eleonore Chiquito, MD          Signed, Addison Naegeli. Audie Box, MD, [place]  [address] [place], [place] [address] [phone]  [phone]  10/24/2020 9:44 AM

## 2020-10-24 ENCOUNTER — Ambulatory Visit (INDEPENDENT_AMBULATORY_CARE_PROVIDER_SITE_OTHER): Payer: Medicare Other | Admitting: Cardiovascular Disease

## 2020-10-24 ENCOUNTER — Encounter: Payer: Self-pay | Admitting: Cardiovascular Disease

## 2020-10-24 ENCOUNTER — Other Ambulatory Visit: Payer: Self-pay

## 2020-10-24 VITALS — BP 134/72 | HR 62 | Ht 64.0 in | Wt 178.0 lb

## 2020-10-24 DIAGNOSIS — E782 Mixed hyperlipidemia: Secondary | ICD-10-CM

## 2020-10-24 DIAGNOSIS — I2510 Atherosclerotic heart disease of native coronary artery without angina pectoris: Secondary | ICD-10-CM | POA: Diagnosis not present

## 2020-10-24 DIAGNOSIS — R609 Edema, unspecified: Secondary | ICD-10-CM

## 2020-10-24 DIAGNOSIS — R0602 Shortness of breath: Secondary | ICD-10-CM

## 2020-10-24 MED ORDER — ROSUVASTATIN CALCIUM 20 MG PO TABS: 20.0000 mg | ORAL_TABLET | Freq: Every day | ORAL | 1 refills | Status: DC

## 2020-10-24 MED ORDER — LOSARTAN POTASSIUM 25 MG PO TABS: 25.0000 mg | ORAL_TABLET | Freq: Every day | ORAL | 3 refills | Status: DC

## 2020-10-24 MED ORDER — ALBUTEROL SULFATE HFA 108 (90 BASE) MCG/ACT IN AERS: 2.0000 | INHALATION_SPRAY | Freq: Four times a day (QID) | RESPIRATORY_TRACT | 2 refills | Status: DC | PRN

## 2020-10-24 NOTE — Patient Instructions (Signed)
Medication Instructions:  Stop Amlodipine  Start Losartan 25 mg daily  Increase Crestor 20 mg daily  Start Albuterol inhaler every 6 hours as needed  *If you need a refill on your cardiac medications before your next appointment, please call your pharmacy*   Lab Work: BNP today  If you have labs (blood work) drawn today and your tests are completely normal, you will receive your results only by: Marland Kitchen MyChart Message (if you have MyChart) OR . A paper copy in the mail If you have any lab test that is abnormal or we need to change your treatment, we will call you to review the results.   Testing/Procedures: Echocardiogram - Your physician has requested that you have an echocardiogram. Echocardiography is a painless test that uses sound waves to create images of your heart. It provides your doctor with information about the size and shape of your heart and how well your heart's chambers and valves are working. This procedure takes approximately one hour. There are no restrictions for this procedure. This will be performed at our [facility] location - [address].  Your physician has recommended that you have a pulmonary function test. Pulmonary Function Tests are a group of tests that measure how well air moves in and out of your lungs.   Follow-Up: At [facility], you and your health needs are our priority.  As part of our continuing mission to provide you with exceptional heart care, we have created designated Provider Care Teams.  These Care Teams include your primary Cardiologist (physician) and Advanced Practice Providers (APPs -  Physician Assistants and Nurse Practitioners) who all work together to provide you with the care you need, when you need it.  We recommend signing up for the patient portal called "MyChart".  Sign up information is provided on this After Visit Summary.  MyChart is used to connect with patients for Virtual Visits (Telemedicine).  Patients are able  to view lab/test results, encounter notes, upcoming appointments, etc.  Non-urgent messages can be sent to your provider as well.   To learn more about what you can do with MyChart, go to [email or web address].    Your next appointment:   2 month(s)  The format for your next appointment:   In Person  Provider:   Eleonore Chiquito, MD

## 2020-10-26 ENCOUNTER — Other Ambulatory Visit: Payer: Self-pay | Admitting: Cardiovascular Disease

## 2020-10-27 LAB — BRAIN NATRIURETIC PEPTIDE: BNP: 20.9 pg/mL (ref 0.0–100.0)

## 2020-10-28 ENCOUNTER — Other Ambulatory Visit (HOSPITAL_COMMUNITY)
Admission: RE | Admit: 2020-10-28 | Discharge: 2020-10-28 | Disposition: A | Discharge status: Home / Self Care | Payer: Medicare Other | Source: Ambulatory Visit | Attending: Cardiovascular Disease | Admitting: Cardiovascular Disease

## 2020-10-28 DIAGNOSIS — Z01812 Encounter for preprocedural laboratory examination: Secondary | ICD-10-CM | POA: Insufficient documentation

## 2020-10-28 DIAGNOSIS — Z20822 Contact with and (suspected) exposure to covid-19: Secondary | ICD-10-CM | POA: Diagnosis not present

## 2020-10-28 LAB — SARS CORONAVIRUS 2 (TAT 6-24 HRS): SARS Coronavirus 2: NEGATIVE

## 2020-10-30 ENCOUNTER — Other Ambulatory Visit: Payer: Self-pay

## 2020-10-30 ENCOUNTER — Ambulatory Visit (HOSPITAL_COMMUNITY)
Admission: RE | Admit: 2020-10-30 | Discharge: 2020-10-30 | Disposition: A | Discharge status: Home / Self Care | Payer: Medicare Other | Source: Ambulatory Visit | Attending: Cardiovascular Disease | Admitting: Cardiovascular Disease

## 2020-10-30 DIAGNOSIS — R0602 Shortness of breath: Secondary | ICD-10-CM | POA: Diagnosis not present

## 2020-10-30 LAB — PULMONARY FUNCTION TEST
DL/VA % pred: 83 %
DL/VA: 3.41 ml/min/mmHg/L
DLCO unc % pred: 73 %
DLCO unc: 14.84 ml/min/mmHg
FEF 25-75 Post: 1.5 L/sec
FEF 25-75 Pre: 1.69 L/sec
FEF2575-%Change-Post: -11 %
FEF2575-%Pred-Post: 76 %
FEF2575-%Pred-Pre: 86 %
FEV1-%Change-Post: -3 %
FEV1-%Pred-Post: 81 %
FEV1-%Pred-Pre: 84 %
FEV1-Post: 1.93 L
FEV1-Pre: 2 L
FEV1FVC-%Change-Post: -1 %
FEV1FVC-%Pred-Pre: 103 %
FEV6-%Change-Post: -2 %
FEV6-%Pred-Post: 83 %
FEV6-%Pred-Pre: 85 %
FEV6-Post: 2.49 L
FEV6-Pre: 2.55 L
FEV6FVC-%Change-Post: 0 %
FEV6FVC-%Pred-Post: 103 %
FEV6FVC-%Pred-Pre: 104 %
FVC-%Change-Post: -1 %
FVC-%Pred-Post: 80 %
FVC-%Pred-Pre: 81 %
FVC-Post: 2.51 L
FVC-Pre: 2.55 L
Post FEV1/FVC ratio: 77 %
Post FEV6/FVC ratio: 99 %
Pre FEV1/FVC ratio: 78 %
Pre FEV6/FVC Ratio: 100 %
RV % pred: 117 %
RV: 2.63 L
TLC % pred: 99 %
TLC: 5.17 L

## 2020-10-30 MED ORDER — ALBUTEROL SULFATE (2.5 MG/3ML) 0.083% IN NEBU
2.5000 mg | INHALATION_SOLUTION | Freq: Once | RESPIRATORY_TRACT | Status: AC
Start: 2020-10-30 — End: 2020-10-30
  Administered 2020-10-30: 2.5 mg via RESPIRATORY_TRACT

## 2020-11-04 ENCOUNTER — Other Ambulatory Visit: Payer: Self-pay

## 2020-11-04 DIAGNOSIS — J439 Emphysema, unspecified: Secondary | ICD-10-CM

## 2020-11-26 ENCOUNTER — Ambulatory Visit (HOSPITAL_COMMUNITY): Payer: Medicare Other | Attending: Internal Medicine

## 2020-11-26 ENCOUNTER — Other Ambulatory Visit: Payer: Self-pay

## 2020-11-26 DIAGNOSIS — R0602 Shortness of breath: Secondary | ICD-10-CM | POA: Insufficient documentation

## 2020-11-27 LAB — ECHOCARDIOGRAM COMPLETE
Area-P 1/2: 2.56 cm2
S' Lateral: 2.6 cm

## 2020-12-12 ENCOUNTER — Ambulatory Visit (INDEPENDENT_AMBULATORY_CARE_PROVIDER_SITE_OTHER): Payer: Medicare Other | Admitting: Pulmonary Disease

## 2020-12-12 ENCOUNTER — Other Ambulatory Visit: Payer: Self-pay

## 2020-12-12 ENCOUNTER — Encounter: Payer: Self-pay | Admitting: Pulmonary Disease

## 2020-12-12 VITALS — BP 130/84 | HR 87 | Temp 98.0°F | Ht 65.0 in | Wt 180.0 lb

## 2020-12-12 DIAGNOSIS — Z720 Tobacco use: Secondary | ICD-10-CM | POA: Diagnosis not present

## 2020-12-12 DIAGNOSIS — J432 Centrilobular emphysema: Secondary | ICD-10-CM | POA: Diagnosis not present

## 2020-12-12 MED ORDER — STIOLTO RESPIMAT 2.5-2.5 MCG/ACT IN AERS: 2.0000 | INHALATION_SPRAY | Freq: Every day | RESPIRATORY_TRACT | 12 refills | Status: DC

## 2020-12-12 MED ORDER — SPIRIVA RESPIMAT 2.5 MCG/ACT IN AERS: 2.0000 | INHALATION_SPRAY | Freq: Every day | RESPIRATORY_TRACT | 0 refills | Status: DC

## 2020-12-12 MED ORDER — STIOLTO RESPIMAT 2.5-2.5 MCG/ACT IN AERS: 2.0000 | INHALATION_SPRAY | Freq: Every day | RESPIRATORY_TRACT | 0 refills | Status: DC

## 2020-12-12 MED ORDER — INCRUSE ELLIPTA 62.5 MCG/INH IN AEPB: 1.0000 | INHALATION_SPRAY | Freq: Every day | RESPIRATORY_TRACT | 0 refills | Status: DC

## 2020-12-12 NOTE — Patient Instructions (Addendum)
Mild centrilobular emphysema/COPD --START Spiriva 2.5 mcg TWO puffs ONCE a day. Sample provided --CONTINUE Albuterol as needed for shortness of breath or wheezing --Continue regular activity daily  Please contact your insurance regarding prices for alternative inhalers based on your plan. Once you have selected an inhaler, please call our office so we can adjust your inhaler.  Follow-up with me 3 month    Tobacco Use Disorder Tobacco use disorder (TUD) occurs when a person craves, seeks, and uses tobacco, regardless of the consequences. This disorder can cause problems with mental and physical health. It can affect your ability to have healthy relationships, and it can keep you from meeting your responsibilities at work, home, or school. Tobacco may be:  Smoked as a cigarette or cigar.  Inhaled using e-cigarettes.  Smoked in a pipe or hookah.  Chewed as smokeless tobacco.  Inhaled into the nostrils as snuff. Tobacco products contain a dangerous chemical called nicotine, which is very addictive. Nicotine triggers hormones that make the body feel stimulated and works on areas of the brain that make you feel good. These effects can make it hard for people to quit nicotine. Tobacco contains many other unsafe chemicals that can damage almost every organ in the body. Smoking tobacco also puts others in danger due to fire risk and possible health problems caused by breathing in secondhand smoke. What are the signs or symptoms? Symptoms of TUD may include:  Being unable to slow down or stop your tobacco use.  Spending an abnormal amount of time getting or using tobacco.  Craving tobacco. Cravings may last for up to 6 months after quitting.  Tobacco use that: ? Interferes with your work, school, or home life. ? Interferes with your personal and social relationships. ? Makes you give up activities that you once enjoyed or found important.  Using tobacco even though you know that it  is: ? Dangerous or bad for your health or someone else's health. ? Causing problems in your life.  Needing more and more of the substance to get the same effect (developing tolerance).  Experiencing unpleasant symptoms if you do not use the substance (withdrawal). Withdrawal symptoms may include: ? Depressed, anxious, or irritable mood. ? Difficulty concentrating. ? Increased appetite. ? Restlessness or trouble sleeping.  Using the substance to avoid withdrawal. How is this diagnosed? This condition may be diagnosed based on:  Your current and past tobacco use. Your health care provider may ask questions about how your tobacco use affects your life.  A physical exam. You may be diagnosed with TUD if you have at least two symptoms within a 61-month period. How is this treated? This condition is treated by stopping tobacco use. Many people are unable to quit on their own and need help. Treatment may include:  Nicotine replacement therapy (NRT). NRT provides nicotine without the other harmful chemicals in tobacco. NRT gradually lowers the dosage of nicotine in the body and reduces withdrawal symptoms. NRT is available as: ? Over-the-counter gums, lozenges, and skin patches. ? Prescription mouth inhalers and nasal sprays.  Medicine that acts on the brain to reduce cravings and withdrawal symptoms.  A type of talk therapy that examines your triggers for tobacco use, how to avoid them, and how to cope with cravings (behavioral therapy).  Hypnosis. This may help with withdrawal symptoms.  Joining a support group for others coping with TUD. The best treatment for TUD is usually a combination of medicine, talk therapy, and support groups. Recovery can be a  long process. Many people start using tobacco again after stopping (relapse). If you relapse, it does not mean that treatment will not work. Follow these instructions at home: Lifestyle  Do not use any products that contain nicotine or  tobacco, such as cigarettes and e-cigarettes.  Avoid things that trigger tobacco use as much as you can. Triggers include people and situations that usually cause you to use tobacco.  Avoid drinks that contain caffeine, including coffee. These may worsen some withdrawal symptoms.  Find ways to manage stress. Wanting to smoke may cause stress, and stress can make you want to smoke. Relaxation techniques such as deep breathing, meditation, and yoga may help.  Attend support groups as needed. These groups are an important part of long-term recovery for many people. General instructions  Take over-the-counter and prescription medicines only as told by your health care provider.  Check with your health care provider before taking any new prescription or over-the-counter medicines.  Decide on a friend, family member, or smoking quit-line (such as 1-800-QUIT-NOW in the [place] that you can call or text when you feel the urge to smoke or when you need help coping with cravings.  Keep all follow-up visits as told by your health care provider and therapist. This is important.   Contact a health care provider if:  You are not able to take your medicines as prescribed.  Your symptoms get worse, even with treatment. Summary  Tobacco use disorder (TUD) occurs when a person craves, seeks, and uses tobacco regardless of the consequences.  This condition may be diagnosed based on your current and past tobacco use and a physical exam.  Many people are unable to quit on their own and need help. Recovery can be a long process.  The most effective treatment for TUD is usually a combination of medicine, talk therapy, and support groups. This information is not intended to replace advice given to you by your health care provider. Make sure you discuss any questions you have with your health care provider. Document Revised: 07/06/2017 Document Reviewed: 07/06/2017 Elsevier Patient Education  2021 [employer].

## 2020-12-12 NOTE — Progress Notes (Signed)
Subjective:   PATIENT ID: [name] GENDER: female DOB: October 18, 1950, MRN: [id number]   HPI  Chief Complaint  Patient presents with  . Follow-up  . Consult    SOB with exertion, some dry coughing. Feels tired all the time    Reason for Visit: New consult for shortness of rbeath  [name] is a 70 year old active smoker with hx of colon cancer s/p surgical resection (no chemoradiation), HTN and HLD who presents as new consult for shortness of breath.  She was recently evaluated with Cardiology for shortness of breath. Note by Dr. Audie Box reviewed and work-up negative for cardiac etiology. She was started on albuterol inhaler. However she reports she has not used it. She reports when she is performing heavy exertion, she will become short of breath. She can walk two blocks without issue. Occasional cough. Denies wheezing. Denies nocturnal symptoms though she sleeps reclined due to reflux related to her hiatal hernia. She would like to be more active without her shortness of breath limiting her.   Social History: Active smoker, 99pack-years. 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  Past Medical History:  Diagnosis Date  . Alcohol abuse, in remission   . Cancer of sigmoid colon ([place]) 07/2005   T2N0M0  . Chronic bronchitis   . Depression   . Dyspnea   . Gastritis   . GERD (gastroesophageal reflux disease)   . History of hiatal hernia   . Hyperlipidemia   . Hypertension   . Hypothyroid    no meds  . Migraines   . OA (osteoarthritis)   . PONV (postoperative nausea and vomiting)   . Reflux      Family History  Problem Relation Age of Onset  . Heart disease Mother        Previous MI at age 50-55  . Atrial fibrillation Mother   . Stroke Mother   . Heart failure Mother   .  Diabetes Sister   . Arrhythmia Sister   . Cancer Father 24       Prostate cancer   . Cancer Maternal Aunt        lung cancer   . Cancer Maternal Uncle        kidney ca     Social History   Occupational History  . Occupation: pet care  Tobacco Use  . Smoking status: Current Every Day Smoker    Packs/day: 2.00    Years: 50.00    Pack years: 100.00    Types: Cigarettes, E-cigarettes    Start date: 67  . Smokeless tobacco: Never Used  . Tobacco comment: 0.5 ppd of cigerettes , using e cigarette   Substance and Sexual Activity  . Alcohol use: No    Comment: recovering  alcoholic  . Drug use: No  . Sexual activity: Not on file    Allergies  Allergen Reactions  . Benadryl [Diphenhydramine Hcl] Nausea And Vomiting and Other (See Comments)    Migraine headaches.  . Lansoprazole Nausea And Vomiting and Other (See Comments)    Migraine headache  . Morphine Sulfate Nausea And Vomiting and Other (See Comments)    Migraine headaches.  . Penicillins Nausea And Vomiting and Other (See Comments)    INTOLERANCE >  NAUSEA & VOMITING YEAST INFECTION > CHANGE IN NORMAL FLORA  Has patient had a PCN  reaction causing immediate rash, facial/tongue/throat swelling, SOB or lightheadedness with hypotension:No Has patient had a PCN reaction causing severe rash involving mucus membranes or skin necrosis: No Has patient had a PCN reaction that required hospitalization No Has patient had a PCN reaction occurring within the last 10 years:No   . Red Dye Other (See Comments)    Also allergic to [facility] Dye-migraine headaches.  . Simvastatin Nausea And Vomiting and Other (See Comments)    Migraine   . Codeine Nausea And Vomiting  . Shellfish Allergy Other (See Comments)    Congestion  . Sulfa Antibiotics Nausea And Vomiting     Outpatient Medications Prior to Visit  Medication Sig Dispense Refill  . albuterol (VENTOLIN HFA) 108 (90 Base) MCG/ACT inhaler Inhale 2 puffs into the lungs every 6  (six) hours as needed for wheezing or shortness of breath. 8 g 2  . ALPRAZolam (XANAX) 0.5 MG tablet 1 tablet    . amitriptyline (ELAVIL) 25 MG tablet Take 100 mg by mouth at bedtime.    . ARIPiprazole (ABILIFY) 15 MG tablet Take 1 tablet by mouth daily.    . calcium carbonate (TUMS - DOSED IN MG ELEMENTAL CALCIUM) 500 MG chewable tablet Chew 2 tablets by mouth 4 (four) times daily as needed for indigestion or heartburn.    . Cholecalciferol (VITAMIN D) 2000 units CAPS Take 2,000 Units by mouth at bedtime.    . Cyanocobalamin (VITAMIN B-12) 5000 MCG TBDP Take 5,000 mcg by mouth at bedtime.    . furosemide (LASIX) 20 MG tablet     . losartan (COZAAR) 25 MG tablet Take 1 tablet (25 mg total) by mouth daily. 90 tablet 3  . metFORMIN (GLUCOPHAGE-XR) 500 MG 24 hr tablet 1,000 mg.    . PARoxetine (PAXIL) 20 MG tablet 1 tablet in the morning    . ranitidine (ZANTAC) 150 MG tablet Take 150 mg by mouth daily as needed (for acid reflux/heartburn).     . rosuvastatin (CRESTOR) 20 MG tablet TAKE 1 TABLET BY MOUTH AT BEDTIME 90 tablet 3  . vitamin E 1000 UNIT capsule Take 1,000 Units by mouth at bedtime.     No facility-administered medications prior to visit.    Review of Systems  Constitutional: Negative for chills, diaphoresis, fever, malaise/fatigue and weight loss.  HENT: Negative for congestion, ear pain and sore throat.   Respiratory: Positive for cough and shortness of breath. Negative for hemoptysis, sputum production and wheezing.   Cardiovascular: Negative for chest pain, palpitations and leg swelling.  Gastrointestinal: Positive for heartburn. Negative for abdominal pain and nausea.  Genitourinary: Negative for frequency.  Musculoskeletal: Negative for joint pain and myalgias.  Skin: Negative for itching and rash.  Neurological: Negative for dizziness, weakness and headaches.  Endo/Heme/Allergies: Does not bruise/bleed easily.  Psychiatric/Behavioral: Negative for depression. The patient  is not nervous/anxious.      Objective:   Vitals:   12/12/20 1442  BP: 130/84  Pulse: 87  Temp: 98 F (36.7 C)  SpO2: 96%  Weight: 180 lb (81.6 kg)  Height: 5\' 5"  (1.651 m)   SpO2: 96 % O2 Device: None (Room air)  Physical Exam: General: Well-appearing, no acute distress HENT: [place], AT Eyes: EOMI, no scleral icterus Respiratory: Clear to auscultation bilaterally.  No crackles, wheezing or rales Cardiovascular: RRR, -M/R/G, no JVD Extremities:-Edema,-tenderness Neuro: AAO x4, CNII-XII grossly intact Skin: Intact, no rashes or bruising Psych: Normal mood, normal affect  Data Reviewed:  Imaging: CT Chest Lung Screen 12/11/19 - Mild  centrilobular emphysema, upper lung predominant. Subcentimeter lung nodules with RUL measuring 2.58mm and subpleural GGO up to 3.52mm. Recommend annual screen  PFT: 10/30/20 FVC 2.51 (80%) FEV1 1.93 (81%) Ratio 78  TLC 99% DLCO 73% Interpretation: Reduced DLCO. Normal spirometry.  Labs: CBC    Component Value Date/Time   WBC 11.9 (H) 04/14/2020 1716   RBC 4.49 04/14/2020 1716   HGB 14.9 04/14/2020 1716   HGB 14.3 09/10/2016 1443   HCT 43.7 04/14/2020 1716   HCT 42.3 09/10/2016 1443   PLT 274 04/14/2020 1716   PLT 258 09/10/2016 1443   MCV 97.3 04/14/2020 1716   MCV 95.3 09/10/2016 1443   MCH 33.2 04/14/2020 1716   MCHC 34.1 04/14/2020 1716   RDW 12.8 04/14/2020 1716   RDW 12.7 09/10/2016 1443   LYMPHSABS 2.5 04/14/2020 1716   LYMPHSABS 3.4 (H) 09/10/2016 1443   MONOABS 0.9 04/14/2020 1716   MONOABS 0.7 09/10/2016 1443   EOSABS 0.1 04/14/2020 1716   EOSABS 0.3 09/10/2016 1443   BASOSABS 0.1 04/14/2020 1716   BASOSABS 0.0 09/10/2016 1443   Absolute eos 04/14/20 100  Imaging, labs and test noted above have been reviewed independently by me.     Assessment & Plan:   Discussion: 70 year old female active smoker with emphysema on CT who presents for management of her shortness of breath. We reviewed history, PFTs and CT imaging  together. We discussed the clinical course of COPD and management with available bronchodilators. I recommend starting with LAMA for initial treatment of symptoms. Will step if needed if this symptoms remain persistent. During this visit, we queried pricing and approval for multiple inhalers however prior auth required. Advised patient to contact insurance.  Mild centrilobular emphysema/COPD --START Spiriva 2.5 mcg TWO puffs ONCE a day. Sample provided --CONTINUE Albuterol as needed for shortness of breath or wheezing --Continue regular activity daily  Tobacco abuse Patient is an active smoker. We discussed smoking cessation for 3 minutes. We discussed triggers and stressors and ways to deal with them. We discussed barriers to continued smoking and benefits of smoking cessation. Provided patient with information cessation techniques and interventions including [place] quitline.   Health Maintenance Immunization History  Administered Date(s) Administered  . PFIZER(Purple Top)SARS-COV-2 Vaccination 10/12/2019, 11/05/2019, 05/02/2020  . Tdap 10/02/2020   CT Lung Screen - due for annual screen in 11/2020  No orders of the defined types were placed in this encounter.  Meds ordered this encounter  Medications  . DISCONTD: Tiotropium Bromide-Olodaterol (STIOLTO RESPIMAT) 2.5-2.5 MCG/ACT AERS    Sig: Inhale 2 puffs into the lungs daily.    Dispense:  4 g    Refill:  12  . DISCONTD: Tiotropium Bromide Monohydrate (SPIRIVA RESPIMAT) 2.5 MCG/ACT AERS    Sig: Inhale 2 puffs into the lungs daily.    Dispense:  4 g    Refill:  0  . DISCONTD: Tiotropium Bromide-Olodaterol (STIOLTO RESPIMAT) 2.5-2.5 MCG/ACT AERS    Sig: Inhale 2 puffs into the lungs daily.    Dispense:  4 g    Refill:  0  . DISCONTD: umeclidinium bromide (INCRUSE ELLIPTA) 62.5 MCG/INH AEPB    Sig: Inhale 1 puff into the lungs daily.    Dispense:  30 each    Refill:  0  . Tiotropium Bromide Monohydrate (SPIRIVA RESPIMAT) 2.5 MCG/ACT  AERS    Sig: Inhale 2 puffs into the lungs daily.    Dispense:  4 g    Refill:  0    Order Specific  Question:   Lot Number?    Answer:   [id number]    Return in about 3 months (around 03/14/2021).  I have spent a total time of 45-minutes on the day of the appointment reviewing prior documentation, coordinating care and discussing medical diagnosis and plan with the patient/family. Imaging, labs and tests included in this note have been reviewed and interpreted independently by me.  [place], MD [facility] 12/12/2020 2:54 PM  Office Number [phone]

## 2020-12-13 ENCOUNTER — Encounter: Payer: Self-pay | Admitting: Pulmonary Disease

## 2020-12-13 DIAGNOSIS — J432 Centrilobular emphysema: Secondary | ICD-10-CM | POA: Insufficient documentation

## 2020-12-22 NOTE — Progress Notes (Signed)
Cardiology Office Note:   Date:  12/24/2020  NAME:  [name]    MRN: [id number] DOB:  03/29/1951   PCP:  Jonathon Jordan, MD  Cardiologist:  None  Electrophysiologist:  None   Referring MD: Jonathon Jordan, MD   Chief Complaint  Patient presents with  . Follow-up   History of Present Illness:   [name] is a 71 y.o. female with a hx of DM, COPD, HTN, HLD who presents for follow-up. Seen for SOB. BNP 21. Echo normal. PFTs with COPD. LE edema reported. We stopped her amlodipine.  Her lower extremity edema has improved off amlodipine.  She is still smoking.  Working on quitting.  She has seen pulmonary.  She cannot afford the inhalers.  We switched her to Crestor given her coronary calcifications.  She is tolerating this well.  She has diabetic with an A1c of 7.3.  Needs to get her cholesterol lower.  She will have repeat labs this year by her primary care physician.  She is without major complaints.  She seems to be doing well.  Just need to quit smoking.  She still has intermittent shortness of breath.  Her BNP was normal.  Echocardiogram showed normal LV function with normal diastolic parameters.  Her symptoms are not related to heart failure.  BNP 21  Problem List 1. DM -A1c 7.3 2. HTN 3. HLD -T chol 185, HDL 51, LDL 95, TG 233 4. Tobacco abuse/COPD -minimal COPD -53 years  5. Coronary calcification on CT scan  -minimal LAD calcifications   Past Medical History: Past Medical History:  Diagnosis Date  . Alcohol abuse, in remission   . Cancer of sigmoid colon ([place]) 07/2005   T2N0M0  . Chronic bronchitis   . Depression   . Dyspnea   . Gastritis   . GERD (gastroesophageal reflux disease)   . History of hiatal hernia   . Hyperlipidemia   . Hypertension   . Hypothyroid    no meds  . Migraines   . OA (osteoarthritis)   . PONV (postoperative nausea and vomiting)   . Reflux     Past Surgical History: Past Surgical History:  Procedure Laterality Date  .  ABDOMINAL EXPOSURE N/A 11/24/2016   Procedure: ABDOMINAL EXPOSURE;  Surgeon: Rosetta Posner, MD;  Location: [facility];  Service: Vascular;  Laterality: N/A;  . ABDOMINAL HYSTERECTOMY    . ANTERIOR LUMBAR FUSION Bilateral 11/24/2016   Procedure: LUMBAR 5-SACRUM 1 ANTERIOR LUMBAR INTERBODY FUSION;  Surgeon: Phylliss Bob, MD;  Location: [facility];  Service: Orthopedics;  Laterality: Bilateral;  LUMBAR 5-SACRUM 1 ANTERIOR LUMBAR INTERBODY FUSION   . APPENDECTOMY    . CHOLECYSTECTOMY    . COLON RESECTION    . COLONOSCOPY    . HAND SURGERY    . repair of incisional hernia    . SHOULDER SURGERY     both shoulders  . TONSILLECTOMY      Current Medications: Current Meds  Medication Sig  . albuterol (VENTOLIN HFA) 108 (90 Base) MCG/ACT inhaler Inhale 2 puffs into the lungs every 6 (six) hours as needed for wheezing or shortness of breath.  . ALPRAZolam (XANAX) 0.5 MG tablet 1 tablet  . amitriptyline (ELAVIL) 25 MG tablet Take 100 mg by mouth at bedtime.  . ARIPiprazole (ABILIFY) 15 MG tablet Take 1 tablet by mouth daily.  . calcium carbonate (TUMS - DOSED IN MG ELEMENTAL CALCIUM) 500 MG chewable tablet Chew 2 tablets by mouth 4 (four) times daily as  needed for indigestion or heartburn.  . Cholecalciferol (VITAMIN D) 2000 units CAPS Take 2,000 Units by mouth at bedtime.  . Cyanocobalamin (VITAMIN B-12) 5000 MCG TBDP Take 5,000 mcg by mouth at bedtime.  Marland Kitchen losartan (COZAAR) 25 MG tablet Take 1 tablet (25 mg total) by mouth daily.  . metFORMIN (GLUCOPHAGE-XR) 500 MG 24 hr tablet 1,000 mg.  . PARoxetine (PAXIL) 20 MG tablet 1 tablet in the morning  . ranitidine (ZANTAC) 150 MG tablet Take 150 mg by mouth daily as needed (for acid reflux/heartburn).   . rosuvastatin (CRESTOR) 20 MG tablet TAKE 1 TABLET BY MOUTH AT BEDTIME  . Tiotropium Bromide Monohydrate (SPIRIVA RESPIMAT) 2.5 MCG/ACT AERS Inhale 2 puffs into the lungs daily.  . vitamin E 1000 UNIT capsule Take 1,000 Units by mouth at bedtime.      Allergies:    Benadryl [diphenhydramine hcl], Lansoprazole, Morphine sulfate, Penicillins, Red dye, Simvastatin, Codeine, Shellfish allergy, and Sulfa antibiotics   Social History: Social History   Socioeconomic History  . Marital status: Single    Spouse name: Not on file  . Number of children: 0  . Years of education: Not on file  . Highest education level: Not on file  Occupational History  . Occupation: pet care  Tobacco Use  . Smoking status: Current Every Day Smoker    Packs/day: 2.00    Years: 50.00    Pack years: 100.00    Types: Cigarettes, E-cigarettes    Start date: 29  . Smokeless tobacco: Never Used  . Tobacco comment: 0.5 ppd of cigerettes , using e cigarette   Substance and Sexual Activity  . Alcohol use: No    Comment: recovering  alcoholic  . Drug use: No  . Sexual activity: Not on file  Other Topics Concern  . Not on file  Social History Narrative  . Not on file   Social Determinants of Health   Financial Resource Strain: Not on file  Food Insecurity: Not on file  Transportation Needs: Not on file  Physical Activity: Not on file  Stress: Not on file  Social Connections: Not on file     Family History: The patient's family history includes Arrhythmia in her sister; Atrial fibrillation in her mother; Cancer in her maternal aunt and maternal uncle; Cancer (age of onset: 37) in her father; Diabetes in her sister; Heart disease in her mother; Heart failure in her mother; Stroke in her mother.  ROS:   All other ROS reviewed and negative. Pertinent positives noted in the HPI.     EKGs/Labs/Other Studies Reviewed:   The following studies were personally reviewed by me today:  TTE 11/27/2020 1. Left ventricular ejection fraction, by estimation, is 60 to 65%. The  left ventricle has normal function. The left ventricle has no regional  wall motion abnormalities. There is mild left ventricular hypertrophy.  Left ventricular diastolic parameters   are consistent with Grade I diastolic dysfunction (impaired relaxation).  2. Right ventricular systolic function is normal. The right ventricular  size is normal. Tricuspid regurgitation signal is inadequate for assessing  PA pressure.  3. Left atrial size was mildly dilated by visual estimate.  4. The mitral valve is grossly normal. Trivial mitral valve  regurgitation. No evidence of mitral stenosis.  5. The aortic valve is grossly normal. There is mild calcification of the  aortic valve. There is mild thickening of the aortic valve. Aortic valve  regurgitation is trivial. No aortic stenosis is present.  6. The inferior vena  cava is normal in size with greater than 50%  respiratory variability, suggesting right atrial pressure of 3 mmHg.   Recent Labs: 04/14/2020: ALT 11; BUN 16; Creatinine, Ser 0.80; Hemoglobin 14.9; Platelets 274; Potassium 3.9; Sodium 138 10/24/2020: BNP 20.9   Recent Lipid Panel No results found for: CHOL, TRIG, HDL, CHOLHDL, VLDL, LDLCALC, LDLDIRECT  Physical Exam:   VS:  BP 134/82   Pulse 72   Ht 5\' 5"  (1.651 m)   Wt 177 lb 12.8 oz (80.6 kg)   SpO2 99%   BMI 29.59 kg/m    Wt Readings from Last 3 Encounters:  12/24/20 177 lb 12.8 oz (80.6 kg)  12/12/20 180 lb (81.6 kg)  10/24/20 178 lb (80.7 kg)    General: Well nourished, well developed, in no acute distress Head: Atraumatic, normal size  Eyes: PEERLA, EOMI  Neck: Supple, no JVD Endocrine: No thryomegaly Cardiac: Normal S1, S2; RRR; no murmurs, rubs, or gallops Lungs: Clear to auscultation bilaterally, no wheezing, rhonchi or rales  Abd: Soft, nontender, no hepatomegaly  Ext: No edema, pulses 2+ Musculoskeletal: No deformities, BUE and BLE strength normal and equal Skin: Warm and dry, no rashes   Neuro: Alert and oriented to person, place, time, and situation, CNII-XII grossly intact, no focal deficits  Psych: Normal mood and affect   ASSESSMENT:   [name] is a 70 y.o. female who  presents for the following: 1. SOB (shortness of breath) on exertion   2. Edema, unspecified type   3. Coronary artery calcification seen on CAT scan   4. Mixed hyperlipidemia     PLAN:   1. SOB (shortness of breath) on exertion -Suspect this is related to COPD and smoking.  Advised to quit.  BNP normal.  Echocardiogram normal.  No symptoms concerning for angina.  2. Edema, unspecified type -Related to amlodipine.  Has improved off amlodipine.  She will continue losartan.  3. Coronary artery calcification seen on CAT scan 4. Mixed hyperlipidemia -Coronary calcification seen on CT scan.  She is diabetic.  Needs her LDL less than 70 and triglycerides less than 150.  We switched her over to Crestor last visit.  She will see [place] yearly for follow-up with lab work.  She will have this done by her PCP this year.  Disposition: Return in about 1 year (around 12/24/2021).  Medication Adjustments/Labs and Tests Ordered: Current medicines are reviewed at length with the patient today.  Concerns regarding medicines are outlined above.  No orders of the defined types were placed in this encounter.  No orders of the defined types were placed in this encounter.   Patient Instructions  Medication Instructions:  The current medical regimen is effective;  continue present plan and medications.  *If you need a refill on your cardiac medications before your next appointment, please call your pharmacy*   Follow-Up: At [facility], you and your health needs are our priority.  As part of our continuing mission to provide you with exceptional heart care, we have created designated Provider Care Teams.  These Care Teams include your primary Cardiologist (physician) and Advanced Practice Providers (APPs -  Physician Assistants and Nurse Practitioners) who all work together to provide you with the care you need, when you need it.  We recommend signing up for the patient portal called "MyChart".  Sign up  information is provided on this After Visit Summary.  MyChart is used to connect with patients for Virtual Visits (Telemedicine).  Patients are able to view  lab/test results, encounter notes, upcoming appointments, etc.  Non-urgent messages can be sent to your provider as well.   To learn more about what you can do with MyChart, go to [email or web address].    Your next appointment:   12 month(s)  The format for your next appointment:   In Person  Provider:   You may see Dr.O'Neal or one of the following Advanced Practice Providers on your designated Care Team:    Almyra Deforest, PA-C  Fabian Sharp, [place] or   Roby Lofts, PA-C        Time Spent with Patient: I have spent a total of 25 minutes with patient reviewing hospital notes, telemetry, EKGs, labs and examining the patient as well as establishing an assessment and plan that was discussed with the patient.  > 50% of time was spent in direct patient care.  Signed, Addison Naegeli. Audie Box, MD, [place]  [address] [place], [place] [address] [phone]  12/24/2020 2:19 PM

## 2020-12-24 ENCOUNTER — Ambulatory Visit (INDEPENDENT_AMBULATORY_CARE_PROVIDER_SITE_OTHER): Payer: Medicare Other | Admitting: Cardiovascular Disease

## 2020-12-24 ENCOUNTER — Encounter: Payer: Self-pay | Admitting: Cardiovascular Disease

## 2020-12-24 ENCOUNTER — Other Ambulatory Visit: Payer: Self-pay

## 2020-12-24 VITALS — BP 134/82 | HR 72 | Ht 65.0 in | Wt 177.8 lb

## 2020-12-24 DIAGNOSIS — F17210 Nicotine dependence, cigarettes, uncomplicated: Secondary | ICD-10-CM | POA: Diagnosis not present

## 2020-12-24 DIAGNOSIS — E782 Mixed hyperlipidemia: Secondary | ICD-10-CM | POA: Diagnosis not present

## 2020-12-24 DIAGNOSIS — R609 Edema, unspecified: Secondary | ICD-10-CM | POA: Diagnosis not present

## 2020-12-24 DIAGNOSIS — I2510 Atherosclerotic heart disease of native coronary artery without angina pectoris: Secondary | ICD-10-CM

## 2020-12-24 DIAGNOSIS — R0602 Shortness of breath: Secondary | ICD-10-CM

## 2020-12-24 NOTE — Patient Instructions (Signed)
Medication Instructions:  The current medical regimen is effective;  continue present plan and medications.  *If you need a refill on your cardiac medications before your next appointment, please call your pharmacy*   Follow-Up: At [facility], you and your health needs are our priority.  As part of our continuing mission to provide you with exceptional heart care, we have created designated Provider Care Teams.  These Care Teams include your primary Cardiologist (physician) and Advanced Practice Providers (APPs -  Physician Assistants and Nurse Practitioners) who all work together to provide you with the care you need, when you need it.  We recommend signing up for the patient portal called "MyChart".  Sign up information is provided on this After Visit Summary.  MyChart is used to connect with patients for Virtual Visits (Telemedicine).  Patients are able to view lab/test results, encounter notes, upcoming appointments, etc.  Non-urgent messages can be sent to your provider as well.   To learn more about what you can do with MyChart, go to [email or web address].    Your next appointment:   12 month(s)  The format for your next appointment:   In Person  Provider:   You may see Dr.O'Neal or one of the following Advanced Practice Providers on your designated Care Team:    Almyra Deforest, PA-C  Fabian Sharp, PA-C or   Roby Lofts, [place]

## 2021-01-19 DIAGNOSIS — F325 Major depressive disorder, single episode, in full remission: Secondary | ICD-10-CM | POA: Diagnosis not present

## 2021-01-19 DIAGNOSIS — R296 Repeated falls: Secondary | ICD-10-CM | POA: Diagnosis not present

## 2021-01-19 DIAGNOSIS — E559 Vitamin D deficiency, unspecified: Secondary | ICD-10-CM | POA: Diagnosis not present

## 2021-01-19 DIAGNOSIS — E1121 Type 2 diabetes mellitus with diabetic nephropathy: Secondary | ICD-10-CM | POA: Diagnosis not present

## 2021-01-19 DIAGNOSIS — Z7984 Long term (current) use of oral hypoglycemic drugs: Secondary | ICD-10-CM | POA: Diagnosis not present

## 2021-01-22 ENCOUNTER — Emergency Department (HOSPITAL_COMMUNITY): Payer: Medicare Other

## 2021-01-22 ENCOUNTER — Emergency Department (HOSPITAL_COMMUNITY)
Admission: EM | Admit: 2021-01-22 | Discharge: 2021-01-23 | Disposition: A | Discharge status: Home / Self Care | Payer: Medicare Other | Attending: Emergency Medicine | Admitting: Emergency Medicine

## 2021-01-22 ENCOUNTER — Other Ambulatory Visit: Payer: Self-pay

## 2021-01-22 ENCOUNTER — Encounter (HOSPITAL_COMMUNITY): Payer: Self-pay | Admitting: Emergency Medicine

## 2021-01-22 DIAGNOSIS — Z85038 Personal history of other malignant neoplasm of large intestine: Secondary | ICD-10-CM | POA: Diagnosis not present

## 2021-01-22 DIAGNOSIS — Z79899 Other long term (current) drug therapy: Secondary | ICD-10-CM | POA: Insufficient documentation

## 2021-01-22 DIAGNOSIS — H53132 Sudden visual loss, left eye: Secondary | ICD-10-CM | POA: Insufficient documentation

## 2021-01-22 DIAGNOSIS — Z7984 Long term (current) use of oral hypoglycemic drugs: Secondary | ICD-10-CM | POA: Diagnosis not present

## 2021-01-22 DIAGNOSIS — E039 Hypothyroidism, unspecified: Secondary | ICD-10-CM | POA: Insufficient documentation

## 2021-01-22 DIAGNOSIS — Z043 Encounter for examination and observation following other accident: Secondary | ICD-10-CM | POA: Diagnosis not present

## 2021-01-22 DIAGNOSIS — I10 Essential (primary) hypertension: Secondary | ICD-10-CM | POA: Diagnosis not present

## 2021-01-22 DIAGNOSIS — S0993XA Unspecified injury of face, initial encounter: Secondary | ICD-10-CM | POA: Diagnosis present

## 2021-01-22 DIAGNOSIS — S0083XA Contusion of other part of head, initial encounter: Secondary | ICD-10-CM | POA: Diagnosis not present

## 2021-01-22 DIAGNOSIS — W01198A Fall on same level from slipping, tripping and stumbling with subsequent striking against other object, initial encounter: Secondary | ICD-10-CM | POA: Diagnosis not present

## 2021-01-22 DIAGNOSIS — W19XXXA Unspecified fall, initial encounter: Secondary | ICD-10-CM

## 2021-01-22 DIAGNOSIS — Y92480 Sidewalk as the place of occurrence of the external cause: Secondary | ICD-10-CM | POA: Diagnosis not present

## 2021-01-22 DIAGNOSIS — E041 Nontoxic single thyroid nodule: Secondary | ICD-10-CM | POA: Diagnosis not present

## 2021-01-22 DIAGNOSIS — F17210 Nicotine dependence, cigarettes, uncomplicated: Secondary | ICD-10-CM | POA: Insufficient documentation

## 2021-01-22 NOTE — ED Provider Notes (Signed)
Emergency Medicine Provider Triage Evaluation Note  [name] , a 70 y.o. female  was evaluated in triage.  Pt complains of mechanical nonsyncopal fall striking the left side of her head.  She denies any loss of consciousness or seizures.  Does not take any blood thinning medications.  She states that the area has been becoming more painful causing her to seek care.  This happened about half an hour prior to arrival.  She denies any vision changes in her left eye..  Review of Systems  Positive: Head/facial swelling, wound Negative: Loss of consciousness, seizures  Physical Exam  BP (!) 168/94 (BP Location: Left Arm)   Pulse (!) 108   Temp 98.2 F (36.8 C) (Oral)   Resp 18   SpO2 93%  Gen:   Awake, no distress   Resp:  Normal effort  MSK:   Moves extremities without difficulty  Other:  Obvious contusion with superficial laceration and surrounding edema on the left lateral brow ridge.   Medical Decision Making  Medically screening exam initiated at 10:00 PM.  Appropriate orders placed.  [name] was informed that the remainder of the evaluation will be completed by another provider, this initial triage assessment does not replace that evaluation, and the importance of remaining in the ED until their evaluation is complete.  Note: Portions of this report may have been transcribed using voice recognition software. Every effort was made to ensure accuracy; however, inadvertent computerized transcription errors may be present    Ollen Gross 01/22/21 2204    Wyvonnia Dusky, MD 01/23/21 [phone]

## 2021-01-22 NOTE — ED Triage Notes (Signed)
Pt reports mechanical fall tonight, hitting her head, denies LOC. Swelling to left eye.

## 2021-01-23 DIAGNOSIS — S0083XA Contusion of other part of head, initial encounter: Secondary | ICD-10-CM | POA: Diagnosis not present

## 2021-01-23 MED ORDER — FLUORESCEIN SODIUM 1 MG OP STRP
1.0000 | ORAL_STRIP | Freq: Once | OPHTHALMIC | Status: AC
  Administered 2021-01-23: 1 via OPHTHALMIC
  Filled 2021-01-23: qty 1

## 2021-01-23 MED ORDER — TETRACAINE HCL 0.5 % OP SOLN
2.0000 [drp] | Freq: Once | OPHTHALMIC | Status: AC
  Administered 2021-01-23: 2 [drp] via OPHTHALMIC
  Filled 2021-01-23: qty 4

## 2021-01-23 NOTE — ED Notes (Signed)
Patient ambulated without need of assistance

## 2021-01-23 NOTE — Discharge Instructions (Signed)
Your CT scans did not show any serious traumatic injury.  They do show a thyroid nodule which you should follow-up with your doctor for an ultrasound. Return to the ED with new or worsening symptoms

## 2021-01-23 NOTE — ED Provider Notes (Signed)
[facility] EMERGENCY DEPARTMENT Provider Note   CSN: [id number] Arrival date & time: 01/22/21  2149     History Chief Complaint  Patient presents with   Fall    [name] is a 70 y.o. female.  Patient here after mechanical fall.  States she tripped on the sidewalk striking her head about 9:30 PM.  Did not lose consciousness.  Complains of pain to her forehead and left eye.  Has some blurry vision but no spots in her vision.  Denies any vomiting.  Denies any blood thinner use.  Complains of a headache.  No neck or back pain.  No chest pain or shortness of breath.  No focal weakness, numbness or tingling.  No abdominal pain.  Landed on her bilateral knees for which she has chronic pain and this is unchanged. Denies any preceding dizziness or lightheadedness.  States one of her medications is being tapered by her PCP due to dizzy spells but she does not have any dizzy spells today. States her vision is at baseline though little bit more blurry in her left eye  The history is provided by the patient.  Fall Associated symptoms include headaches. Pertinent negatives include no chest pain, no abdominal pain and no shortness of breath.      Past Medical History:  Diagnosis Date   Alcohol abuse, in remission    Cancer of sigmoid colon ([place]) 07/2005   T2N0M0   Chronic bronchitis    Depression    Dyspnea    Gastritis    GERD (gastroesophageal reflux disease)    History of hiatal hernia    Hyperlipidemia    Hypertension    Hypothyroid    no meds   Migraines    OA (osteoarthritis)    PONV (postoperative nausea and vomiting)    Reflux     Patient Active Problem List   Diagnosis Date Noted   Centrilobular emphysema ([place]) 12/13/2020   Radiculopathy 11/24/2016   H/O colon cancer, stage I 01/19/2013   Nonspecific abnormal electrocardiogram (ECG) (EKG) 03/31/2011   Chest pain 03/31/2011   Hyperlipidemia 03/31/2011   Hypertension 03/31/2011   Tobacco abuse  03/31/2011    Past Surgical History:  Procedure Laterality Date   ABDOMINAL EXPOSURE N/A 11/24/2016   Procedure: ABDOMINAL EXPOSURE;  Surgeon: Rosetta Posner, MD;  Location: [place];  Service: Vascular;  Laterality: N/A;   ABDOMINAL HYSTERECTOMY     ANTERIOR LUMBAR FUSION Bilateral 11/24/2016   Procedure: LUMBAR 5-SACRUM 1 ANTERIOR LUMBAR INTERBODY FUSION;  Surgeon: Phylliss Bob, MD;  Location: [place];  Service: Orthopedics;  Laterality: Bilateral;  LUMBAR 5-SACRUM 1 ANTERIOR LUMBAR INTERBODY FUSION    APPENDECTOMY     CHOLECYSTECTOMY     COLON RESECTION     COLONOSCOPY     HAND SURGERY     repair of incisional hernia     SHOULDER SURGERY     both shoulders   TONSILLECTOMY       OB History   No obstetric history on file.     Family History  Problem Relation Age of Onset   Heart disease Mother        Previous MI at age 52-55   Atrial fibrillation Mother    Stroke Mother    Heart failure Mother    Diabetes Sister    Arrhythmia Sister    Cancer Father 33       Prostate cancer    Cancer Maternal Aunt  lung cancer    Cancer Maternal Uncle        kidney ca    Social History   Tobacco Use   Smoking status: Every Day    Packs/day: 2.00    Years: 50.00    Pack years: 100.00    Types: Cigarettes, E-cigarettes    Start date: 1971   Smokeless tobacco: Never   Tobacco comments:    0.5 ppd of cigerettes , using e cigarette   Substance Use Topics   Alcohol use: No    Comment: recovering  alcoholic   Drug use: No    Home Medications Prior to Admission medications   Medication Sig Start Date End Date Taking? Authorizing Provider  albuterol (VENTOLIN HFA) 108 (90 Base) MCG/ACT inhaler Inhale 2 puffs into the lungs every 6 (six) hours as needed for wheezing or shortness of breath. 10/24/20   O'Neal, Cassie Freer, MD  ALPRAZolam Duanne Moron) 0.5 MG tablet 1 tablet 02/24/20   [provider]  amitriptyline (ELAVIL) 25 MG tablet Take 100 mg by mouth at bedtime.     [provider]  ARIPiprazole (ABILIFY) 15 MG tablet Take 1 tablet by mouth daily. 02/12/20   [provider]  calcium carbonate (TUMS - DOSED IN MG ELEMENTAL CALCIUM) 500 MG chewable tablet Chew 2 tablets by mouth 4 (four) times daily as needed for indigestion or heartburn.    [provider]  Cholecalciferol (VITAMIN D) 2000 units CAPS Take 2,000 Units by mouth at bedtime.    [provider]  Cyanocobalamin (VITAMIN B-12) 5000 MCG TBDP Take 5,000 mcg by mouth at bedtime.    [provider]  losartan (COZAAR) 25 MG tablet Take 1 tablet (25 mg total) by mouth daily. 10/24/20 01/22/21  Geralynn Rile, MD  metFORMIN (GLUCOPHAGE-XR) 500 MG 24 hr tablet 1,000 mg. 07/31/20   [provider]  PARoxetine (PAXIL) 20 MG tablet 1 tablet in the morning 02/11/20   [provider]  ranitidine (ZANTAC) 150 MG tablet Take 150 mg by mouth daily as needed (for acid reflux/heartburn).     [provider]  rosuvastatin (CRESTOR) 20 MG tablet TAKE 1 TABLET BY MOUTH AT BEDTIME 10/27/20   O'Neal, Cassie Freer, MD  Tiotropium Bromide Monohydrate (SPIRIVA RESPIMAT) 2.5 MCG/ACT AERS Inhale 2 puffs into the lungs daily. 12/12/20   Margaretha Seeds, MD  vitamin E 1000 UNIT capsule Take 1,000 Units by mouth at bedtime.    [provider]    Allergies    Benadryl [diphenhydramine hcl], Lansoprazole, Morphine sulfate, Penicillins, Red dye, Simvastatin, Codeine, Shellfish allergy, and Sulfa antibiotics  Review of Systems   Review of Systems  Constitutional:  Negative for activity change, appetite change and fever.  HENT:  Negative for congestion and rhinorrhea.   Eyes:  Positive for pain and visual disturbance. Negative for photophobia.  Respiratory:  Negative for cough, chest tightness and shortness of breath.   Cardiovascular:  Negative for chest pain.  Gastrointestinal:  Negative for abdominal pain, nausea and vomiting.  Genitourinary:   Negative for dysuria and hematuria.  Musculoskeletal:  Negative for arthralgias and myalgias.  Skin:  Positive for wound.  Neurological:  Positive for headaches. Negative for dizziness, weakness and light-headedness.   all other systems are negative except as noted in the HPI and PMH.   Physical Exam Updated Vital Signs BP (!) 135/105 (BP Location: Left Arm)   Pulse 83   Temp 98.2 F (36.8 C) (Oral)   Resp 18  SpO2 97%   Physical Exam Vitals and nursing note reviewed.  Constitutional:      General: She is not in acute distress.    Appearance: She is well-developed.  HENT:     Head: Normocephalic.     Comments: Hematoma and ecchymosis to left forehead and periorbital region.  Superficial laceration.  Extraocular movements are intact    Mouth/Throat:     Pharynx: No oropharyngeal exudate.  Eyes:     Conjunctiva/sclera: Conjunctivae normal.     Pupils: Pupils are equal, round, and reactive to light.     Comments: No obvious areas of fluorescein uptake.  No hyphema or hypopyon.  Extraocular pressures 12-14.  Neck:     Comments: No C-spine tenderness Cardiovascular:     Rate and Rhythm: Normal rate and regular rhythm.     Heart sounds: Normal heart sounds. No murmur heard. Pulmonary:     Effort: Pulmonary effort is normal. No respiratory distress.     Breath sounds: Normal breath sounds.  Abdominal:     Palpations: Abdomen is soft.     Tenderness: There is no abdominal tenderness. There is no guarding or rebound.  Musculoskeletal:        General: No tenderness. Normal range of motion.     Cervical back: Normal range of motion and neck supple.     Comments: Full range of motion hips without pain bilaterally.  Knees good range of motion intact without abrasions  Skin:    General: Skin is warm.  Neurological:     Mental Status: She is alert and oriented to person, place, and time.     Cranial Nerves: No cranial nerve deficit.     Motor: No abnormal muscle tone.      Coordination: Coordination normal.     Comments:  5/5 strength throughout. CN 2-12 intact.Equal grip strength.   Psychiatric:        Behavior: Behavior normal.    ED Results / Procedures / Treatments   Labs (all labs ordered are listed, but only abnormal results are displayed) Labs Reviewed - No data to display  EKG EKG Interpretation  Date/Time:  Friday January 23 2021 02:45:27 EDT Ventricular Rate:  82 PR Interval:  158 QRS Duration: 86 QT Interval:  372 QTC Calculation: 435 R Axis:   -37 Text Interpretation: Sinus rhythm Left axis deviation Abnormal R-wave progression, late transition Nonspecific T abnormalities, lateral leads No significant change was found Confirmed by Ezequiel Essex [phone]) on 01/23/2021 3:20:21 AM  Radiology CT Head Wo Contrast  Result Date: 01/22/2021 CLINICAL DATA:  Mechanical fall tonight. EXAM: CT HEAD WITHOUT CONTRAST CT MAXILLOFACIAL WITHOUT CONTRAST CT CERVICAL SPINE WITHOUT CONTRAST TECHNIQUE: Multidetector CT imaging of the head, cervical spine, and maxillofacial structures were performed using the standard protocol without intravenous contrast. Multiplanar CT image reconstructions of the cervical spine and maxillofacial structures were also generated. COMPARISON:  None. FINDINGS: CT HEAD FINDINGS Brain: No evidence of large-territorial acute infarction. No parenchymal hemorrhage. No mass lesion. No extra-axial collection. No mass effect or midline shift. No hydrocephalus. Basilar cisterns are patent. Vascular: No hyperdense vessel. Skull: No acute fracture or focal lesion. Other: None. CT MAXILLOFACIAL FINDINGS Osseous: No fracture or mandibular dislocation. No destructive process. Sinuses/Orbits: Paranasal sinuses and mastoid air cells are clear. Bilateral lens replacement. Otherwise the orbits are unremarkable. Soft tissues: There is a 1.6 cm subcutaneus soft tissue hematoma formation along the left frontal/periorbital region. CT CERVICAL SPINE FINDINGS  Alignment: Normal. Skull base and vertebrae: Multilevel degenerative  changes of the spine. No acute fracture. No aggressive appearing focal osseous lesion or focal pathologic process. Soft tissues and spinal canal: No prevertebral fluid or swelling. No visible canal hematoma. Upper chest: Unremarkable. Other: There is a 1.8 cm left thyroid gland hypodense nodule. Subcentimeter right hypodense thyroid gland nodule. IMPRESSION: 1. No acute intracranial abnormality. 2.  No acute displaced facial fracture. 3. No acute displaced fracture or traumatic listhesis of the cervical spine. 4. A 1.8 cm left thyroid gland hypodense nodule. Recommend thyroid US (ref: J Am Coll Radiol. 2015 Feb;12(2): 143-50). Electronically Signed   By: Iven Finn M.D.   On: 01/22/2021 23:37   CT Cervical Spine Wo Contrast  Result Date: 01/22/2021 CLINICAL DATA:  Mechanical fall tonight. EXAM: CT HEAD WITHOUT CONTRAST CT MAXILLOFACIAL WITHOUT CONTRAST CT CERVICAL SPINE WITHOUT CONTRAST TECHNIQUE: Multidetector CT imaging of the head, cervical spine, and maxillofacial structures were performed using the standard protocol without intravenous contrast. Multiplanar CT image reconstructions of the cervical spine and maxillofacial structures were also generated. COMPARISON:  None. FINDINGS: CT HEAD FINDINGS Brain: No evidence of large-territorial acute infarction. No parenchymal hemorrhage. No mass lesion. No extra-axial collection. No mass effect or midline shift. No hydrocephalus. Basilar cisterns are patent. Vascular: No hyperdense vessel. Skull: No acute fracture or focal lesion. Other: None. CT MAXILLOFACIAL FINDINGS Osseous: No fracture or mandibular dislocation. No destructive process. Sinuses/Orbits: Paranasal sinuses and mastoid air cells are clear. Bilateral lens replacement. Otherwise the orbits are unremarkable. Soft tissues: There is a 1.6 cm subcutaneus soft tissue hematoma formation along the left frontal/periorbital region. CT  CERVICAL SPINE FINDINGS Alignment: Normal. Skull base and vertebrae: Multilevel degenerative changes of the spine. No acute fracture. No aggressive appearing focal osseous lesion or focal pathologic process. Soft tissues and spinal canal: No prevertebral fluid or swelling. No visible canal hematoma. Upper chest: Unremarkable. Other: There is a 1.8 cm left thyroid gland hypodense nodule. Subcentimeter right hypodense thyroid gland nodule. IMPRESSION: 1. No acute intracranial abnormality. 2.  No acute displaced facial fracture. 3. No acute displaced fracture or traumatic listhesis of the cervical spine. 4. A 1.8 cm left thyroid gland hypodense nodule. Recommend thyroid US (ref: J Am Coll Radiol. 2015 Feb;12(2): 143-50). Electronically Signed   By: Iven Finn M.D.   On: 01/22/2021 23:37   CT Maxillofacial WO CM  Result Date: 01/22/2021 CLINICAL DATA:  Mechanical fall tonight. EXAM: CT HEAD WITHOUT CONTRAST CT MAXILLOFACIAL WITHOUT CONTRAST CT CERVICAL SPINE WITHOUT CONTRAST TECHNIQUE: Multidetector CT imaging of the head, cervical spine, and maxillofacial structures were performed using the standard protocol without intravenous contrast. Multiplanar CT image reconstructions of the cervical spine and maxillofacial structures were also generated. COMPARISON:  None. FINDINGS: CT HEAD FINDINGS Brain: No evidence of large-territorial acute infarction. No parenchymal hemorrhage. No mass lesion. No extra-axial collection. No mass effect or midline shift. No hydrocephalus. Basilar cisterns are patent. Vascular: No hyperdense vessel. Skull: No acute fracture or focal lesion. Other: None. CT MAXILLOFACIAL FINDINGS Osseous: No fracture or mandibular dislocation. No destructive process. Sinuses/Orbits: Paranasal sinuses and mastoid air cells are clear. Bilateral lens replacement. Otherwise the orbits are unremarkable. Soft tissues: There is a 1.6 cm subcutaneus soft tissue hematoma formation along the left  frontal/periorbital region. CT CERVICAL SPINE FINDINGS Alignment: Normal. Skull base and vertebrae: Multilevel degenerative changes of the spine. No acute fracture. No aggressive appearing focal osseous lesion or focal pathologic process. Soft tissues and spinal canal: No prevertebral fluid or swelling. No visible canal hematoma. Upper chest: Unremarkable. Other:  There is a 1.8 cm left thyroid gland hypodense nodule. Subcentimeter right hypodense thyroid gland nodule. IMPRESSION: 1. No acute intracranial abnormality. 2.  No acute displaced facial fracture. 3. No acute displaced fracture or traumatic listhesis of the cervical spine. 4. A 1.8 cm left thyroid gland hypodense nodule. Recommend thyroid US (ref: J Am Coll Radiol. 2015 Feb;12(2): 143-50). Electronically Signed   By: Iven Finn M.D.   On: 01/22/2021 23:37    Procedures Procedures   Medications Ordered in ED Medications  fluorescein ophthalmic strip 1 strip (has no administration in time range)  tetracaine (PONTOCAINE) 0.5 % ophthalmic solution 2 drop (has no administration in time range)    ED Course  I have reviewed the triage vital signs and the nursing notes.  Pertinent labs & imaging results that were available during my care of the patient were reviewed by me and considered in my medical decision making (see chart for details).    MDM Rules/Calculators/A&P                         Apparent mechanical fall with head injury.  Neurologically intact.  CT imaging in triage is negative for traumatic injury.  Does show thyroid nodule.  Tetanus up-to-date  Visual Acuity Bilateral Near: 20/16 Bilateral Distance: 20/32 R Near: 20/40 R Distance: 20/63 L Near: 20/16 L Distance: 20/40  Exam reassuring as above.  Patient is able to tolerate p.o. and ambulate.  EKG shows no acute arrhythmia.  Patient declines x-rays of her knees.  She is able to ambulate without difficulty. Follow-up with PCP.  Recommend recheck next week.  Use  Tylenol or Motrin as needed for pain.  Follow-up for ultrasound of thyroid nodule.  Return precautions discussed Final Clinical Impression(s) / ED Diagnoses Final diagnoses:  Fall, initial encounter  Contusion of face, initial encounter    Rx / DC Orders ED Discharge Orders     None        Ellese Julius, Annie Main, MD 01/23/21 [phone]

## 2021-02-09 DIAGNOSIS — F331 Major depressive disorder, recurrent, moderate: Secondary | ICD-10-CM | POA: Diagnosis not present

## 2021-02-09 DIAGNOSIS — R296 Repeated falls: Secondary | ICD-10-CM | POA: Diagnosis not present

## 2021-02-09 DIAGNOSIS — E041 Nontoxic single thyroid nodule: Secondary | ICD-10-CM | POA: Diagnosis not present

## 2021-02-10 ENCOUNTER — Other Ambulatory Visit: Payer: Self-pay | Admitting: Family Medicine

## 2021-02-10 DIAGNOSIS — E041 Nontoxic single thyroid nodule: Secondary | ICD-10-CM

## 2021-02-20 ENCOUNTER — Ambulatory Visit
Admission: RE | Admit: 2021-02-20 | Discharge: 2021-02-20 | Disposition: A | Discharge status: Home / Self Care | Payer: Medicare Other | Source: Ambulatory Visit | Attending: Family Medicine | Admitting: Family Medicine

## 2021-02-20 ENCOUNTER — Other Ambulatory Visit: Payer: Self-pay

## 2021-02-20 DIAGNOSIS — E042 Nontoxic multinodular goiter: Secondary | ICD-10-CM | POA: Diagnosis not present

## 2021-02-20 DIAGNOSIS — E041 Nontoxic single thyroid nodule: Secondary | ICD-10-CM

## 2021-02-23 DIAGNOSIS — Z20822 Contact with and (suspected) exposure to covid-19: Secondary | ICD-10-CM | POA: Diagnosis not present

## 2021-02-24 ENCOUNTER — Other Ambulatory Visit: Payer: Self-pay | Admitting: Family Medicine

## 2021-02-24 DIAGNOSIS — E041 Nontoxic single thyroid nodule: Secondary | ICD-10-CM

## 2021-03-07 ENCOUNTER — Emergency Department (HOSPITAL_COMMUNITY)
Admission: EM | Admit: 2021-03-07 | Discharge: 2021-03-07 | Disposition: A | Discharge status: Home / Self Care | Payer: Medicare Other | Attending: Emergency Medicine | Admitting: Emergency Medicine

## 2021-03-07 ENCOUNTER — Emergency Department (HOSPITAL_COMMUNITY): Payer: Medicare Other

## 2021-03-07 ENCOUNTER — Encounter (HOSPITAL_COMMUNITY): Payer: Self-pay | Admitting: Emergency Medicine

## 2021-03-07 ENCOUNTER — Other Ambulatory Visit: Payer: Self-pay

## 2021-03-07 DIAGNOSIS — E039 Hypothyroidism, unspecified: Secondary | ICD-10-CM | POA: Diagnosis not present

## 2021-03-07 DIAGNOSIS — S0181XA Laceration without foreign body of other part of head, initial encounter: Secondary | ICD-10-CM | POA: Diagnosis not present

## 2021-03-07 DIAGNOSIS — I10 Essential (primary) hypertension: Secondary | ICD-10-CM | POA: Diagnosis not present

## 2021-03-07 DIAGNOSIS — M47812 Spondylosis without myelopathy or radiculopathy, cervical region: Secondary | ICD-10-CM | POA: Diagnosis not present

## 2021-03-07 DIAGNOSIS — S0993XA Unspecified injury of face, initial encounter: Secondary | ICD-10-CM | POA: Diagnosis not present

## 2021-03-07 DIAGNOSIS — S0990XA Unspecified injury of head, initial encounter: Secondary | ICD-10-CM | POA: Diagnosis not present

## 2021-03-07 DIAGNOSIS — S51812A Laceration without foreign body of left forearm, initial encounter: Secondary | ICD-10-CM | POA: Insufficient documentation

## 2021-03-07 DIAGNOSIS — Z79899 Other long term (current) drug therapy: Secondary | ICD-10-CM | POA: Insufficient documentation

## 2021-03-07 DIAGNOSIS — W19XXXA Unspecified fall, initial encounter: Secondary | ICD-10-CM

## 2021-03-07 DIAGNOSIS — W01198A Fall on same level from slipping, tripping and stumbling with subsequent striking against other object, initial encounter: Secondary | ICD-10-CM | POA: Diagnosis not present

## 2021-03-07 DIAGNOSIS — E041 Nontoxic single thyroid nodule: Secondary | ICD-10-CM | POA: Diagnosis not present

## 2021-03-07 DIAGNOSIS — F17210 Nicotine dependence, cigarettes, uncomplicated: Secondary | ICD-10-CM | POA: Diagnosis not present

## 2021-03-07 DIAGNOSIS — S199XXA Unspecified injury of neck, initial encounter: Secondary | ICD-10-CM | POA: Diagnosis not present

## 2021-03-07 DIAGNOSIS — M2578 Osteophyte, vertebrae: Secondary | ICD-10-CM | POA: Diagnosis not present

## 2021-03-07 DIAGNOSIS — J3489 Other specified disorders of nose and nasal sinuses: Secondary | ICD-10-CM | POA: Diagnosis not present

## 2021-03-07 DIAGNOSIS — S0101XA Laceration without foreign body of scalp, initial encounter: Secondary | ICD-10-CM | POA: Diagnosis not present

## 2021-03-07 DIAGNOSIS — Z85038 Personal history of other malignant neoplasm of large intestine: Secondary | ICD-10-CM | POA: Diagnosis not present

## 2021-03-07 MED ORDER — LIDOCAINE-EPINEPHRINE (PF) 2 %-1:200000 IJ SOLN
20.0000 mL | Freq: Once | INTRAMUSCULAR | Status: AC
  Administered 2021-03-07: 20 mL
  Filled 2021-03-07: qty 20

## 2021-03-07 NOTE — ED Triage Notes (Signed)
Pt c/o mechanical fall that occurred ~1hr PTA which resulted in large ~2 in laceration on left side of forehead. Bleeding controlled at this time. Denies LOC, dizziness, vision changes, and blood thinners.

## 2021-03-07 NOTE — ED Provider Notes (Signed)
I provided a substantive portion of the care of this patient.  I personally performed the entirety of the exam for this encounter.      Patient fell carrying her dog up some stairs and struck her left forehead on the right iron railing.  He sustained a large laceration.  No confusion.  Patient is alert with clear mental status.  Patient has large laceration approximately 5 cm to the left forehead just before the temple at the end of the brow line.  Gaping with no active bleeding.  Linear.  Agree with plan of management.   Christy Shanks, MD 03/07/21 1600

## 2021-03-07 NOTE — ED Provider Notes (Signed)
[facility] DEPT Provider Note   CSN: [id number] Arrival date & time: 03/07/21  1250     History Chief Complaint  Patient presents with   Fall   Head Injury    [name] is a 70 y.o. female who presents to the emergency department with complaints of head injury that occurred shortly prior to arrival.  Patient states that she was walking up the steps carrying her dog when she tripped and fell forward hitting her head.  She denies loss of consciousness.  She sustained a laceration to her forehead which is sore and tingly, no other significant areas of pain.  No alleviating or aggravating factors.  Last tetanus was in March 2022.  She denies visual disturbance, numbness, weakness, vomiting, seizure activity, chest pain, abdominal pain, neck pain, or back pain.  She is not on blood thinners.  HPI     Past Medical History:  Diagnosis Date   Alcohol abuse, in remission    Cancer of sigmoid colon ([place]) 07/2005   T2N0M0   Chronic bronchitis    Depression    Dyspnea    Gastritis    GERD (gastroesophageal reflux disease)    History of hiatal hernia    Hyperlipidemia    Hypertension    Hypothyroid    no meds   Migraines    OA (osteoarthritis)    PONV (postoperative nausea and vomiting)    Reflux     Patient Active Problem List   Diagnosis Date Noted   Centrilobular emphysema ([place]) 12/13/2020   Radiculopathy 11/24/2016   H/O colon cancer, stage I 01/19/2013   Nonspecific abnormal electrocardiogram (ECG) (EKG) 03/31/2011   Chest pain 03/31/2011   Hyperlipidemia 03/31/2011   Hypertension 03/31/2011   Tobacco abuse 03/31/2011    Past Surgical History:  Procedure Laterality Date   ABDOMINAL EXPOSURE N/A 11/24/2016   Procedure: ABDOMINAL EXPOSURE;  Surgeon: Rosetta Posner, MD;  Location: [facility];  Service: Vascular;  Laterality: N/A;   ABDOMINAL HYSTERECTOMY     ANTERIOR LUMBAR FUSION Bilateral 11/24/2016   Procedure: LUMBAR 5-SACRUM 1 ANTERIOR  LUMBAR INTERBODY FUSION;  Surgeon: Phylliss Bob, MD;  Location: [facility];  Service: Orthopedics;  Laterality: Bilateral;  LUMBAR 5-SACRUM 1 ANTERIOR LUMBAR INTERBODY FUSION    APPENDECTOMY     CHOLECYSTECTOMY     COLON RESECTION     COLONOSCOPY     HAND SURGERY     repair of incisional hernia     SHOULDER SURGERY     both shoulders   TONSILLECTOMY       OB History   No obstetric history on file.     Family History  Problem Relation Age of Onset   Heart disease Mother        Previous MI at age 87-55   Atrial fibrillation Mother    Stroke Mother    Heart failure Mother    Diabetes Sister    Arrhythmia Sister    Cancer Father 46       Prostate cancer    Cancer Maternal Aunt        lung cancer    Cancer Maternal Uncle        kidney ca    Social History   Tobacco Use   Smoking status: Every Day    Packs/day: 2.00    Years: 50.00    Pack years: 100.00    Types: Cigarettes, E-cigarettes    Start date: 1971   Smokeless tobacco: Never  Tobacco comments:    0.5 ppd of cigerettes , using e cigarette   Substance Use Topics   Alcohol use: No    Comment: recovering  alcoholic   Drug use: No    Home Medications Prior to Admission medications   Medication Sig Start Date End Date Taking? Authorizing Provider  albuterol (VENTOLIN HFA) 108 (90 Base) MCG/ACT inhaler Inhale 2 puffs into the lungs every 6 (six) hours as needed for wheezing or shortness of breath. 10/24/20   O'Neal, Cassie Freer, MD  ALPRAZolam Duanne Moron) 0.5 MG tablet 1 tablet 02/24/20   [provider]  amitriptyline (ELAVIL) 25 MG tablet Take 100 mg by mouth at bedtime.    [provider]  ARIPiprazole (ABILIFY) 15 MG tablet Take 1 tablet by mouth daily. 02/12/20   [provider]  calcium carbonate (TUMS - DOSED IN MG ELEMENTAL CALCIUM) 500 MG chewable tablet Chew 2 tablets by mouth 4 (four) times daily as needed for indigestion or heartburn.    [provider]  Cholecalciferol  (VITAMIN D) 2000 units CAPS Take 2,000 Units by mouth at bedtime.    [provider]  Cyanocobalamin (VITAMIN B-12) 5000 MCG TBDP Take 5,000 mcg by mouth at bedtime.    [provider]  losartan (COZAAR) 25 MG tablet Take 1 tablet (25 mg total) by mouth daily. 10/24/20 01/22/21  Geralynn Rile, MD  metFORMIN (GLUCOPHAGE-XR) 500 MG 24 hr tablet 1,000 mg. 07/31/20   [provider]  PARoxetine (PAXIL) 20 MG tablet 1 tablet in the morning 02/11/20   [provider]  ranitidine (ZANTAC) 150 MG tablet Take 150 mg by mouth daily as needed (for acid reflux/heartburn).     [provider]  rosuvastatin (CRESTOR) 20 MG tablet TAKE 1 TABLET BY MOUTH AT BEDTIME 10/27/20   O'Neal, Cassie Freer, MD  Tiotropium Bromide Monohydrate (SPIRIVA RESPIMAT) 2.5 MCG/ACT AERS Inhale 2 puffs into the lungs daily. 12/12/20   Margaretha Seeds, MD  vitamin E 1000 UNIT capsule Take 1,000 Units by mouth at bedtime.    [provider]    Allergies    Benadryl [diphenhydramine hcl], Lansoprazole, Morphine sulfate, Penicillins, Red dye, Simvastatin, Codeine, Shellfish allergy, and Sulfa antibiotics  Review of Systems   Review of Systems  Constitutional:  Negative for chills and fever.  Respiratory:  Negative for shortness of breath.   Cardiovascular:  Negative for chest pain.  Gastrointestinal:  Negative for abdominal pain and vomiting.  Musculoskeletal:  Negative for back pain and neck pain.  Neurological:  Positive for headaches. Negative for seizures, syncope and weakness.       Positive for paresthesias to the laceration site.  All other systems reviewed and are negative.  Physical Exam Updated Vital Signs BP (!) 154/100   Pulse 94   Temp 98.2 F (36.8 C)   Resp 18   SpO2 94%   Physical Exam Vitals and nursing note reviewed.  Constitutional:      General: She is not in acute distress.    Appearance: She is well-developed.  HENT:     Head: No  raccoon eyes or Battle's sign.      Comments: Other than over the left forehead laceration no facial bony tenderness to palpation.    Right Ear: No hemotympanum.     Left Ear: No hemotympanum.  Eyes:     General:        Right eye: No discharge.        Left eye: No discharge.  Extraocular Movements: Extraocular movements intact.     Conjunctiva/sclera: Conjunctivae normal.     Pupils: Pupils are equal, round, and reactive to light.  Cardiovascular:     Rate and Rhythm: Normal rate and regular rhythm.     Heart sounds: No murmur heard. Pulmonary:     Effort: No respiratory distress.     Breath sounds: Normal breath sounds. No wheezing or rales.  Chest:     Chest wall: No tenderness.  Abdominal:     General: There is no distension.     Palpations: Abdomen is soft.     Tenderness: There is no abdominal tenderness. There is no guarding or rebound.  Musculoskeletal:     Cervical back: No spinous process tenderness.     Comments: Skin tear to the left forearm.  No active bleeding.  No significant subcutaneous tissue exposure.  Patient actively ranging all major joints of the upper and lower extremities.  No focal bony tenderness. Back: No midline tenderness or step-off  Skin:    General: Skin is warm and dry.     Findings: No rash.  Neurological:     Comments: Alert.  Clear speech.  CN III through XII grossly intact.  Able to symmetrically raise her eyebrows.  Sensation and strength grossly tact bilateral upper and lower extremities.  Ambulatory.  Psychiatric:        Behavior: Behavior normal.    ED Results / Procedures / Treatments   Labs (all labs ordered are listed, but only abnormal results are displayed) Labs Reviewed - No data to display  EKG None  Radiology CT HEAD WO CONTRAST (5MM)  Result Date: 03/07/2021 CLINICAL DATA:  Fall and laceration. Patient struck face on a metal railing. Laceration of the forehead. EXAM: CT HEAD WITHOUT CONTRAST CT CERVICAL SPINE WITHOUT  CONTRAST TECHNIQUE: Multidetector CT imaging of the head and cervical spine was performed following the standard protocol without intravenous contrast. Multiplanar CT image reconstructions of the cervical spine were also generated. COMPARISON:  01/22/2021 FINDINGS: CT HEAD FINDINGS Brain: There is minimal periventricular white matter change. There is no intra or extra-axial fluid collection or mass lesion. The basilar cisterns and ventricles have a normal appearance. There is no CT evidence for acute infarction or hemorrhage. Vascular: No hyperdense vessel or unexpected calcification. Skull: Normal. Negative for fracture or focal lesion. Sinuses/Orbits: There is mucosal thickening of the paranasal sinuses. No air-fluid levels or evidence for sinus wall fracture. Other: LEFT frontal scalp laceration not associated with underlying calvarial fracture. CT CERVICAL SPINE FINDINGS Alignment: Normal. Skull base and vertebrae: No acute fracture. Soft tissues and spinal canal: LEFT thyroid nodule, as identified previously. Recent thyroid ultrasound demonstrated a 3 centimeter LEFT inferior thyroid nodule meeting criteria for FNA biopsy. Disc levels: Moderate degenerative changes in the mid cervical spine. Moderate disc height loss and uncovertebral spurring at C4-5. Upper chest: Negative. Other: None IMPRESSION: 1.  No evidence for acute intracranial abnormality. 2. LEFT frontal scalp laceration not associated fracture. 3. Chronic paranasal sinus changes. 4. Mid cervical degenerative changes without acute cervical spine injury. 5. Re-demonstrated LEFT thyroid nodule for which biopsy has been recommended. Electronically Signed   By: Nolon Nations M.D.   On: 03/07/2021 16:05   CT Cervical Spine Wo Contrast  Result Date: 03/07/2021 CLINICAL DATA:  Fall and laceration. Patient struck face on a metal railing. Laceration of the forehead. EXAM: CT HEAD WITHOUT CONTRAST CT CERVICAL SPINE WITHOUT CONTRAST TECHNIQUE:  Multidetector CT imaging of the head and cervical  spine was performed following the standard protocol without intravenous contrast. Multiplanar CT image reconstructions of the cervical spine were also generated. COMPARISON:  01/22/2021 FINDINGS: CT HEAD FINDINGS Brain: There is minimal periventricular white matter change. There is no intra or extra-axial fluid collection or mass lesion. The basilar cisterns and ventricles have a normal appearance. There is no CT evidence for acute infarction or hemorrhage. Vascular: No hyperdense vessel or unexpected calcification. Skull: Normal. Negative for fracture or focal lesion. Sinuses/Orbits: There is mucosal thickening of the paranasal sinuses. No air-fluid levels or evidence for sinus wall fracture. Other: LEFT frontal scalp laceration not associated with underlying calvarial fracture. CT CERVICAL SPINE FINDINGS Alignment: Normal. Skull base and vertebrae: No acute fracture. Soft tissues and spinal canal: LEFT thyroid nodule, as identified previously. Recent thyroid ultrasound demonstrated a 3 centimeter LEFT inferior thyroid nodule meeting criteria for FNA biopsy. Disc levels: Moderate degenerative changes in the mid cervical spine. Moderate disc height loss and uncovertebral spurring at C4-5. Upper chest: Negative. Other: None IMPRESSION: 1.  No evidence for acute intracranial abnormality. 2. LEFT frontal scalp laceration not associated fracture. 3. Chronic paranasal sinus changes. 4. Mid cervical degenerative changes without acute cervical spine injury. 5. Re-demonstrated LEFT thyroid nodule for which biopsy has been recommended. Electronically Signed   By: Nolon Nations M.D.   On: 03/07/2021 16:05    Procedures .Marland KitchenLaceration Repair  Date/Time: 03/07/2021 6:15 PM Performed by: Amaryllis Dyke, PA-C Authorized by: Amaryllis Dyke, PA-C   Consent:    Consent obtained:  Verbal   Consent given by:  Patient   Risks, benefits, and alternatives were  discussed: yes     Risks discussed:  Infection, need for additional repair, nerve damage, poor wound healing, poor cosmetic result, pain and retained foreign body   Alternatives discussed:  No treatment Anesthesia:    Anesthesia method:  Local infiltration   Local anesthetic:  Lidocaine 2% WITH epi Laceration details:    Location:  Face   Face location:  Forehead   Length (cm):  6   Depth (mm):  3 Pre-procedure details:    Preparation:  Patient was prepped and draped in usual sterile fashion and imaging obtained to evaluate for foreign bodies Treatment:    Area cleansed with:  Povidone-iodine   Amount of cleaning:  Standard   Irrigation solution:  Sterile water   Irrigation method:  Pressure wash Skin repair:    Repair method:  Sutures   Suture size:  6-0   Suture material:  Prolene   Suture technique:  Simple interrupted   Number of sutures:  10 Approximation:    Approximation:  Close Repair type:    Repair type:  Simple Post-procedure details:    Procedure completion:  Tolerated well, no immediate complications Comments:     Procedure preformed by [place] PA-S2 under my supervision & with patient's consent. o   Medications Ordered in ED Medications  lidocaine-EPINEPHrine (XYLOCAINE W/EPI) 2 %-1:200000 (PF) injection 20 mL (has no administration in time range)    ED Course  I have reviewed the triage vital signs and the nursing notes.  Pertinent labs & imaging results that were available during my care of the patient were reviewed by me and considered in my medical decision making (see chart for details).    MDM Rules/Calculators/A&P                           Patient presents to the ED with  complaints of left forehead laceration status post mechanical fall.  Nontoxic, blood pressure mildly elevated, doubt hypertensive emergency.  Additional history obtained:  Additional history obtained from chart review & nursing note review.  Last tetanus in March  2022  Imaging Studies ordered:  I ordered imaging studies which included CT head and cervical spine, I independently reviewed, formal radiology impression shows:  1.  No evidence for acute intracranial abnormality. 2. LEFT frontal scalp laceration not associated fracture. 3. Chronic paranasal sinus changes. 4. Mid cervical degenerative changes without acute cervical spine injury. 5. Re-demonstrated LEFT thyroid nodule for which biopsy has been recommended  ED Course: CT head without intracranial bleed, CT cervical spine without acute fracture.  Discussed left thyroid nodule, patient states that she has had follow-up for this and her PCP is managing it.  She has no point/focal vertebral tenderness or focal neurologic deficits.  She has no chest or abdominal tenderness.  Tetanus is up-to-date.  Wound irrigated, visualized in a bloodless field, no evidence of foreign body, repaired per procedure note above.  Tolerated well.  Discussed suture home care as well as need for removal in 5 days. I discussed results, treatment plan, need for follow-up, and return precautions with the patient. Provided opportunity for questions, patient confirmed understanding and is in agreement with plan.   Discussed findings and plan of care with supervising physician Dr. Vallery Ridge who has evaluated the patient and is in agreement.  Portions of this note were generated with [employer]. Dictation errors may occur despite best attempts at proofreading.  Final Clinical Impression(s) / ED Diagnoses Final diagnoses:  Injury of head, initial encounter  Fall, initial encounter  Laceration of forehead, initial encounter    Rx / DC Orders ED Discharge Orders     None        Leafy Kindle 03/07/21 1821    Charlesetta Shanks, MD 04/05/21 1435    Charlesetta Shanks, MD 04/05/21 1435

## 2021-03-07 NOTE — ED Provider Notes (Signed)
Emergency Medicine Provider Triage Evaluation Note  [name] , a 70 y.o. female  was evaluated in triage.  Pt complains of full and laceration.  Fall occurred just prior to arrival in the emergency department.  Was walking up steps when she lost her balance and fell forward.  Patient reports striking her face on a metal railing.  Patient denies any loss of consciousness.  Reports laceration to forehead.  Complains of pain and numbness around laceration site.  Denies any headache, visual disturbance, neck pain, back pain, numbness to extremities, weakness, saddle anesthesia, bowel or bladder dysfunction.  Patient is not on any blood thinners.  Patient reports tetanus shot was within the last 5 years.  Review of Systems  Positive: Laceration,  Negative: eadache, visual disturbance, neck pain, back pain, numbness, weakness, saddle anesthesia, bowel or bladder dysfunction  Physical Exam  BP (!) 154/100   Pulse 94   Temp 98.2 F (36.8 C)   Resp 18   SpO2 94%  Gen:   Awake, no distress   Resp:  Normal effort  MSK:   Moves extremities without difficulty  Other:  No midline tenderness or deformity to cervical, thoracic, lumbar spine.  No facial asymmetry or slurred speech.  EOM intact bilaterally, pupils PERRL.  5 cm laceration to left side of patient's forehead, hemorrhage controlled.  Numbness near laceration site.  Medical Decision Making  Medically screening exam initiated at 3:15 PM.  Appropriate orders placed.  Carling D Mapes was informed that the remainder of the evaluation will be completed by another provider, this initial triage assessment does not replace that evaluation, and the importance of remaining in the ED until their evaluation is complete.  The patient appears stable so that the remainder of the work up may be completed by another provider.      Loni Beckwith, PA-C 03/07/21 1517    Teressa Lower, MD 03/07/21 [phone]

## 2021-03-07 NOTE — Discharge Instructions (Addendum)
You were seen in the emergency department today for a laceration. Your laceration was closed with 10` stitches. Please keep this area clean and dry for the next 24 hours, after 24 hours you may get this area wet, but avoid soaking the area. Keep the area covered as best possible especially when in the sun to help in minimizing scarring.   Your CT scan showed findings of your laceration on your forehead, but no brain bleeding.  Your CT scan of your neck still noted the thyroid nodule that was previously discussed, please continue to have this evaluated by your primary care provider.  Your CT scan also showed some degenerative changes in your neck.   You will need to have the stitches removed and the wound rechecked in 5 days. Please return to the emergency department, go to an urgent care, or see your primary care provider to have this performed. Return to the ER soon should you start to experience pus type drainage from the wound, redness around the wound, or fevers as this could indicate the area is infected, please return to the ER for any other worsening symptoms or concerns that you may have.

## 2021-03-11 ENCOUNTER — Other Ambulatory Visit (HOSPITAL_COMMUNITY)
Admission: RE | Admit: 2021-03-11 | Discharge: 2021-03-11 | Disposition: A | Discharge status: Home / Self Care | Payer: Medicare Other | Source: Ambulatory Visit | Attending: Family Medicine | Admitting: Family Medicine

## 2021-03-11 ENCOUNTER — Other Ambulatory Visit: Payer: Self-pay

## 2021-03-11 ENCOUNTER — Ambulatory Visit
Admission: RE | Admit: 2021-03-11 | Discharge: 2021-03-11 | Disposition: A | Discharge status: Home / Self Care | Payer: Medicare Other | Source: Ambulatory Visit | Attending: Family Medicine | Admitting: Family Medicine

## 2021-03-11 DIAGNOSIS — E041 Nontoxic single thyroid nodule: Secondary | ICD-10-CM

## 2021-03-13 DIAGNOSIS — R296 Repeated falls: Secondary | ICD-10-CM | POA: Diagnosis not present

## 2021-03-13 DIAGNOSIS — S0181XA Laceration without foreign body of other part of head, initial encounter: Secondary | ICD-10-CM | POA: Diagnosis not present

## 2021-03-13 DIAGNOSIS — E042 Nontoxic multinodular goiter: Secondary | ICD-10-CM | POA: Diagnosis not present

## 2021-03-13 LAB — CYTOLOGY - NON PAP

## 2021-03-20 ENCOUNTER — Encounter: Payer: Self-pay | Admitting: Neurology

## 2021-03-20 ENCOUNTER — Ambulatory Visit (INDEPENDENT_AMBULATORY_CARE_PROVIDER_SITE_OTHER): Payer: Medicare Other | Admitting: Neurology

## 2021-03-20 VITALS — BP 133/89 | HR 92 | Ht 65.0 in | Wt 176.5 lb

## 2021-03-20 DIAGNOSIS — R2689 Other abnormalities of gait and mobility: Secondary | ICD-10-CM | POA: Diagnosis not present

## 2021-03-20 DIAGNOSIS — R55 Syncope and collapse: Secondary | ICD-10-CM | POA: Diagnosis not present

## 2021-03-20 DIAGNOSIS — E119 Type 2 diabetes mellitus without complications: Secondary | ICD-10-CM | POA: Diagnosis not present

## 2021-03-20 DIAGNOSIS — G629 Polyneuropathy, unspecified: Secondary | ICD-10-CM | POA: Diagnosis not present

## 2021-03-20 DIAGNOSIS — E559 Vitamin D deficiency, unspecified: Secondary | ICD-10-CM

## 2021-03-20 DIAGNOSIS — R569 Unspecified convulsions: Secondary | ICD-10-CM

## 2021-03-20 NOTE — Progress Notes (Signed)
[facility]  PATIENT: [name] DOB: February 25, 1951  REFERRING CLINICIAN: Jonathon Jordan, MD HISTORY FROM: Patient  REASON FOR VISIT: Multiple falls.   HISTORICAL  CHIEF COMPLAINT:  Chief Complaint  Patient presents with   Multiple falls    Rm 12 New Pt  "not remembering why I fall, no LOC"     HISTORY OF PRESENT ILLNESS:  This is a 70 year old woman with past medical history of hypertension hyperlipidemia, anxiety depression, colon cancer treated with surgery who is presented after multiple falls for the past year.  Patient stated in the past this is a having a total of 4 falls of unclear mechanism.  She mentioned for one fall, she remembers tripping and falling but the other 3 falls she did not know how it happened other than the fact that she woke up from the ground with injuries.  She denies any warning sign prior to the fall, denies any lightheadedness, no change in vision and no abnormal heartbeat prior to the fall.  Her last one was on August 6 when she had left forehead laceration requiring multiple stitches.  At that time a head CT was done no evidence of intracranial bleeding.  She also reports urinary incontinence with 1 event.  Patient stated she lives alone no one had witnessed the falls.  She reported no confusion after falls.  She reports following up with a cardiologist had and was told that everything was normal including an echocardiogram of the heart.   Patient also reports a balance problem, she cannot stand on one foot, reports that when shutting eye, she sway, report some numbness in the legs.   OTHER MEDICAL CONDITIONS: No reported heart disease, HTN, HLD, Anxiety/Depression, history of cancer treated with surgery    REVIEW OF SYSTEMS: Full 14 system review of systems performed and negative with exception of: as noted in the HPI  ALLERGIES: Allergies  Allergen Reactions   Benadryl [Diphenhydramine Hcl] Nausea And Vomiting and Other (See  Comments)    Migraine headaches.   Lansoprazole Nausea And Vomiting and Other (See Comments)    Migraine headache   Morphine Sulfate Nausea And Vomiting and Other (See Comments)    Migraine headaches.   Penicillins Nausea And Vomiting and Other (See Comments)    INTOLERANCE >  NAUSEA & VOMITING YEAST INFECTION > CHANGE IN NORMAL FLORA  Has patient had a PCN reaction causing immediate rash, facial/tongue/throat swelling, SOB or lightheadedness with hypotension:No Has patient had a PCN reaction causing severe rash involving mucus membranes or skin necrosis: No Has patient had a PCN reaction that required hospitalization No Has patient had a PCN reaction occurring within the last 10 years:No    Red Dye Other (See Comments)    Also allergic to [facility] Dye-migraine headaches.   Simvastatin Nausea And Vomiting and Other (See Comments)    Migraine    Codeine Nausea And Vomiting   Shellfish Allergy Other (See Comments)    Congestion   Sulfa Antibiotics Nausea And Vomiting    HOME MEDICATIONS: Outpatient Medications Prior to Visit  Medication Sig Dispense Refill   amitriptyline (ELAVIL) 25 MG tablet Take 100 mg by mouth at bedtime.     ARIPiprazole (ABILIFY) 15 MG tablet Take 1 tablet by mouth daily.     calcium carbonate (TUMS - DOSED IN MG ELEMENTAL CALCIUM) 500 MG chewable tablet Chew 2 tablets by mouth 4 (four) times daily as needed for indigestion or heartburn.     Cholecalciferol (VITAMIN D) 2000  units CAPS Take 2,000 Units by mouth at bedtime.     Cyanocobalamin (VITAMIN B-12) 5000 MCG TBDP Take 5,000 mcg by mouth at bedtime.     losartan (COZAAR) 25 MG tablet Take 1 tablet (25 mg total) by mouth daily. 90 tablet 3   metFORMIN (GLUCOPHAGE-XR) 500 MG 24 hr tablet 1,000 mg.     PARoxetine (PAXIL) 20 MG tablet 1 tablet in the morning     ranitidine (ZANTAC) 150 MG tablet Take 150 mg by mouth daily as needed (for acid reflux/heartburn).      rosuvastatin (CRESTOR) 20 MG tablet TAKE 1  TABLET BY MOUTH AT BEDTIME 90 tablet 3   Tiotropium Bromide Monohydrate (SPIRIVA RESPIMAT) 2.5 MCG/ACT AERS Inhale 2 puffs into the lungs daily. 4 g 0   vitamin E 1000 UNIT capsule Take 1,000 Units by mouth at bedtime.     albuterol (VENTOLIN HFA) 108 (90 Base) MCG/ACT inhaler Inhale 2 puffs into the lungs every 6 (six) hours as needed for wheezing or shortness of breath. (Patient not taking: Reported on 03/20/2021) 8 g 2   ALPRAZolam (XANAX) 0.5 MG tablet 1 tablet (Patient not taking: Reported on 03/20/2021)     No facility-administered medications prior to visit.    PAST MEDICAL HISTORY: Past Medical History:  Diagnosis Date   Alcohol abuse, in remission    Cancer of sigmoid colon ([place]) 07/2005   T2N0M0   Chronic bronchitis    Depression    Dyspnea    Gastritis    GERD (gastroesophageal reflux disease)    History of hiatal hernia    Hyperlipidemia    Hypertension    Hypothyroid    no meds   Migraines    OA (osteoarthritis)    PONV (postoperative nausea and vomiting)    Reflux     PAST SURGICAL HISTORY: Past Surgical History:  Procedure Laterality Date   ABDOMINAL EXPOSURE N/A 11/24/2016   Procedure: ABDOMINAL EXPOSURE;  Surgeon: Rosetta Posner, MD;  Location: [facility];  Service: Vascular;  Laterality: N/A;   ABDOMINAL HYSTERECTOMY     ANTERIOR LUMBAR FUSION Bilateral 11/24/2016   Procedure: LUMBAR 5-SACRUM 1 ANTERIOR LUMBAR INTERBODY FUSION;  Surgeon: Phylliss Bob, MD;  Location: [facility];  Service: Orthopedics;  Laterality: Bilateral;  LUMBAR 5-SACRUM 1 ANTERIOR LUMBAR INTERBODY FUSION    APPENDECTOMY     CHOLECYSTECTOMY     COLON RESECTION     COLONOSCOPY     HAND SURGERY     repair of incisional hernia     SHOULDER SURGERY     both shoulders   TONSILLECTOMY      FAMILY HISTORY: Family History  Problem Relation Age of Onset   Heart disease Mother        Previous MI at age 12-55   Atrial fibrillation Mother    Stroke Mother    Heart failure Mother    Seizures Father         poss d/t alcohol   Cancer Father 47       Prostate cancer    Diabetes Sister    Arrhythmia Sister    Cancer Maternal Aunt        lung cancer    Cancer Maternal Uncle        kidney ca    SOCIAL HISTORY: Social History   Socioeconomic History   Marital status: Soil scientist    Spouse name: Not on file   Number of children: 0   Years of education: Not on file  Highest education level: Some college, no degree  Occupational History   Occupation: pet care  Tobacco Use   Smoking status: Every Day    Packs/day: 0.50    Years: 50.00    Pack years: 25.00    Types: Cigarettes, E-cigarettes    Start date: 1971   Smokeless tobacco: Never   Tobacco comments:    0.5 ppd of cigerettes , using e cigarette   Substance and Sexual Activity   Alcohol use: No    Comment: recovering  alcoholic   Drug use: No   Sexual activity: Not on file  Other Topics Concern   Not on file  Social History Narrative   Caffeine 3 cups per day    Lives with Partner Dawn   Social Determinants of Health   Financial Resource Strain: Not on file  Food Insecurity: Not on file  Transportation Needs: Not on file  Physical Activity: Not on file  Stress: Not on file  Social Connections: Not on file  Intimate Partner Violence: Not on file     PHYSICAL EXAM GENERAL EXAM/CONSTITUTIONAL: Vitals:  Vitals:   03/20/21 0848  BP: 133/89  Pulse: 92  Weight: 176 lb 8 oz (80.1 kg)  Height: '5\' 5"'$  (1.651 m)   Body mass index is 29.37 kg/m. Wt Readings from Last 3 Encounters:  03/20/21 176 lb 8 oz (80.1 kg)  12/24/20 177 lb 12.8 oz (80.6 kg)  12/12/20 180 lb (81.6 kg)   Patient is in no distress; well developed, nourished and groomed; neck is supple  CARDIOVASCULAR: Examination of carotid arteries is normal; no carotid bruits Regular rate and rhythm, no murmurs Examination of peripheral vascular system by observation and palpation is normal  EYES: Pupils round and reactive to light, Visual  fields full to confrontation, Extraocular movements intacts,   MUSCULOSKELETAL: Gait, strength, tone, movements noted in Neurologic exam below  NEUROLOGIC: MENTAL STATUS:  awake, alert, oriented to person, place and time recent and remote memory intact normal attention and concentration language fluent, comprehension intact, naming intact fund of knowledge appropriate  CRANIAL NERVE:  2nd, 3rd, 4th, 6th - pupils equal and reactive to light, visual fields full to confrontation, extraocular muscles intact, no nystagmus 5th - facial sensation symmetric 7th - facial strength symmetric 8th - hearing intact 9th - palate elevates symmetrically, uvula midline 11th - shoulder shrug symmetric 12th - tongue protrusion midline  MOTOR:  normal bulk and tone, full strength in the BUE, BLE  SENSORY:  Decrease sensation to vibration and pinprick to the lower extremities up to ankle. Positive Romberg.  COORDINATION:  finger-nose-finger, fine finger movements normal  REFLEXES:  deep tendon reflexes present and symmetric  GAIT/STATION:  Positive Romberg, wide based gait. Unable to tandem gait   DIAGNOSTIC DATA (LABS, IMAGING, TESTING) - I reviewed patient records, labs, notes, testing and imaging myself where available.  Lab Results  Component Value Date   WBC 11.9 (H) 04/14/2020   HGB 14.9 04/14/2020   HCT 43.7 04/14/2020   MCV 97.3 04/14/2020   PLT 274 04/14/2020      Component Value Date/Time   NA 138 04/14/2020 1716   NA 139 09/10/2016 1443   K 3.9 04/14/2020 1716   K 3.7 09/10/2016 1443   CL 102 04/14/2020 1716   CO2 26 04/14/2020 1716   CO2 29 09/10/2016 1443   GLUCOSE 155 (H) 04/14/2020 1716   GLUCOSE 94 09/10/2016 1443   BUN 16 04/14/2020 1716   BUN 20.2 09/10/2016 1443  CREATININE 0.80 04/14/2020 1716   CREATININE 0.9 09/10/2016 1443   CALCIUM 9.3 04/14/2020 1716   CALCIUM 10.3 09/10/2016 1443   PROT 7.5 04/14/2020 1716   PROT 7.6 09/10/2016 1443   ALBUMIN  4.1 04/14/2020 1716   ALBUMIN 4.1 09/10/2016 1443   AST 19 04/14/2020 1716   AST 14 09/10/2016 1443   ALT 11 04/14/2020 1716   ALT 8 09/10/2016 1443   ALKPHOS 80 04/14/2020 1716   ALKPHOS 88 09/10/2016 1443   BILITOT 0.4 04/14/2020 1716   BILITOT 0.42 09/10/2016 1443   GFRNONAA >60 04/14/2020 1716   GFRAA >60 04/14/2020 1716   No results found for: CHOL, HDL, LDLCALC, LDLDIRECT, TRIG, CHOLHDL No results found for: HGBA1C No results found for: VITAMINB12 No results found for: TSH   Head CT 03/07/2021: 1.  No evidence for acute intracranial abnormality. 2. LEFT frontal scalp laceration not associated fracture. 3. Chronic paranasal sinus changes. 4. Mid cervical degenerative changes without acute cervical spine injury. 5. Re-demonstrated LEFT thyroid nodule for which biopsy has been recommended.  ASSESSMENT AND PLAN  70 y.o. year old female with past medical history of hypertension, hyperlipidemia, anxiety depression, vitamin D deficiency, who is presenting with multiple falls.  She reports no warning signs prior to the fall and denies any post ictal confusion.  She lives alone so there are no witness to the falls and no reported abnormal movements  On exam today she has evidence of neuropathy confirmed by decreased sensation to pinprick and vibration in the lower extremities.  She also have a positive Romberg test.  She cannot tandem gait.  We will order the reversible neuropathy labs.  Because of the falls without any warning signs I will also rule out seizures with a EEG. Patient will follow-up in 3 months.    1. Syncope, unspecified syncope type   2. Seizure-like activity ([place])   3. Neuropathy   4. Type 2 diabetes mellitus without complications (HCC)    5. Other abnormalities of gait and mobility    6. Vitamin D deficiency, unspecified      PLAN: We will do blood test today for neuropathy  We will order EEG to rule out seizure  Continue your current medications  Follow up  in 3 months or sooner if worse    Orders Placed This Encounter  Procedures   Thyroid Panel With TSH   Hemoglobin A1c   Vitamin B12   Sedimentation Rate   VITAMIN D 25 Hydroxy (Vit-D Deficiency, Fractures)   C-reactive Protein   EEG adult    No orders of the defined types were placed in this encounter.   Return in about 3 months (around 06/20/2021).    Alric Ran, MD 03/20/2021, 8:20 PM  [facility] [address], [place], [place] [address] [phone]

## 2021-03-20 NOTE — Patient Instructions (Signed)
We will do blood test today for neuropathy  We will order EEG to rule out seizure  Continue your current medications  Follow up in 3 months or sooner if worse

## 2021-03-21 LAB — C-REACTIVE PROTEIN: CRP: 3 mg/L (ref 0–10)

## 2021-03-21 LAB — THYROID PANEL WITH TSH
Free Thyroxine Index: 1.8 (ref 1.2–4.9)
T3 Uptake Ratio: 29 % (ref 24–39)
T4, Total: 6.2 ug/dL (ref 4.5–12.0)
TSH: 2.78 u[IU]/mL (ref 0.450–4.500)

## 2021-03-21 LAB — VITAMIN D 25 HYDROXY (VIT D DEFICIENCY, FRACTURES): Vit D, 25-Hydroxy: 23 ng/mL — ABNORMAL LOW (ref 30.0–100.0)

## 2021-03-21 LAB — VITAMIN B12: Vitamin B-12: 438 pg/mL (ref 232–1245)

## 2021-03-21 LAB — HEMOGLOBIN A1C
Est. average glucose Bld gHb Est-mCnc: 143 mg/dL
Hgb A1c MFr Bld: 6.6 % — ABNORMAL HIGH (ref 4.8–5.6)

## 2021-03-21 LAB — SEDIMENTATION RATE: Sed Rate: 14 mm/hr (ref 0–40)

## 2021-03-23 ENCOUNTER — Telehealth: Payer: Self-pay | Admitting: Neurology

## 2021-03-23 ENCOUNTER — Telehealth: Payer: Self-pay | Admitting: *Deleted

## 2021-03-23 NOTE — Telephone Encounter (Signed)
-----   Message from Alric Ran, MD sent at 03/23/2021  9:05 AM EDT ----- Please call and advise the patient that the recent labs we checked were within normal limits, except her HgA1c and her Vit D level. Please inform patient to follow up with her PMD to discuss the HgA1c level and also to start Vit D supplement (50000 units weekly). Please remind patient to keep any upcoming appointments or tests and to call us with any interim questions, concerns, problems or updates. Thanks,   Alric Ran, MD

## 2021-03-23 NOTE — Telephone Encounter (Signed)
EEG order sent to [facility], they will reach out to pt to schedule. 

## 2021-03-23 NOTE — Telephone Encounter (Signed)
I spoke to the patient who verbalized understanding of the lab findings. She is currently still taking vitamin D, 50,000 units weekly (two doses left of her prescription from primary care). She was in agreement for follow up w/ PCP.

## 2021-04-01 ENCOUNTER — Other Ambulatory Visit: Payer: Self-pay

## 2021-04-01 ENCOUNTER — Ambulatory Visit (INDEPENDENT_AMBULATORY_CARE_PROVIDER_SITE_OTHER): Payer: Medicare Other | Admitting: Neurology

## 2021-04-01 DIAGNOSIS — R569 Unspecified convulsions: Secondary | ICD-10-CM

## 2021-04-01 DIAGNOSIS — R55 Syncope and collapse: Secondary | ICD-10-CM

## 2021-04-01 NOTE — Progress Notes (Signed)
Routine EEG completed at [facility] with photic stimulation and without hyperventilation,  Full report to follow by Dr. Marijean Heath

## 2021-04-07 NOTE — Procedures (Signed)
    History:  70 yo woman with seizure like activity   EEG classification: Normal awake and drowsy  Description of the recording: The background rhythms of this recording consists of a fairly well modulated medium amplitude alpha rhythm of 10 Hz that is reactive to eye opening and closure. As the record progresses, the patient appears to remain in the waking state throughout the recording. Photic stimulation was performed, did not show any abnormalities. Hyperventilation was not performed. Toward the end of the recording, the patient enters the drowsy state with slight symmetric slowing seen. The patient never enters stage II sleep. No abnormal epileptiform discharges seen during this recording. There was no focal slowing. EKG monitor shows no evidence of cardiac rhythm abnormalities with a heart rate of 78 bpm.  Impression: This is a normal EEG recording in the waking and drowsy state. No evidence of interictal epileptiform discharges seen. A normal EEG does not exclude a diagnosis of epilepsy.    Alric Ran, MD [facility]

## 2021-04-08 NOTE — Progress Notes (Signed)
Called and informed patient that her EEG result was normal.  Follow up as scheduled.    Alric Ran, MD

## 2021-04-23 DIAGNOSIS — E559 Vitamin D deficiency, unspecified: Secondary | ICD-10-CM | POA: Diagnosis not present

## 2021-05-06 DIAGNOSIS — Z23 Encounter for immunization: Secondary | ICD-10-CM | POA: Diagnosis not present

## 2021-05-11 DIAGNOSIS — M170 Bilateral primary osteoarthritis of knee: Secondary | ICD-10-CM | POA: Diagnosis not present

## 2021-06-11 DIAGNOSIS — Z85038 Personal history of other malignant neoplasm of large intestine: Secondary | ICD-10-CM | POA: Diagnosis not present

## 2021-06-11 DIAGNOSIS — K432 Incisional hernia without obstruction or gangrene: Secondary | ICD-10-CM | POA: Diagnosis not present

## 2021-06-11 DIAGNOSIS — E042 Nontoxic multinodular goiter: Secondary | ICD-10-CM | POA: Insufficient documentation

## 2021-06-17 DIAGNOSIS — M170 Bilateral primary osteoarthritis of knee: Secondary | ICD-10-CM | POA: Diagnosis not present

## 2021-06-17 DIAGNOSIS — M1712 Unilateral primary osteoarthritis, left knee: Secondary | ICD-10-CM | POA: Diagnosis not present

## 2021-06-17 DIAGNOSIS — M1711 Unilateral primary osteoarthritis, right knee: Secondary | ICD-10-CM | POA: Diagnosis not present

## 2021-06-18 ENCOUNTER — Ambulatory Visit (INDEPENDENT_AMBULATORY_CARE_PROVIDER_SITE_OTHER): Payer: Medicare Other | Admitting: Neurology

## 2021-06-18 ENCOUNTER — Encounter: Payer: Self-pay | Admitting: Neurology

## 2021-06-18 VITALS — BP 151/96 | HR 102 | Ht 65.0 in | Wt 176.0 lb

## 2021-06-18 DIAGNOSIS — R2689 Other abnormalities of gait and mobility: Secondary | ICD-10-CM | POA: Diagnosis not present

## 2021-06-18 DIAGNOSIS — R296 Repeated falls: Secondary | ICD-10-CM

## 2021-06-18 DIAGNOSIS — G6289 Other specified polyneuropathies: Secondary | ICD-10-CM | POA: Diagnosis not present

## 2021-06-18 DIAGNOSIS — W19XXXD Unspecified fall, subsequent encounter: Secondary | ICD-10-CM

## 2021-06-18 MED ORDER — GABAPENTIN 100 MG PO CAPS: 100.0000 mg | ORAL_CAPSULE | Freq: Three times a day (TID) | ORAL | 0 refills | Status: DC

## 2021-06-18 NOTE — Progress Notes (Signed)
[facility]  PATIENT: [name] DOB: 1950-10-10  REFERRING CLINICIAN: Jonathon Jordan, MD HISTORY FROM: Patient  REASON FOR VISIT: Multiple falls.   HISTORICAL  CHIEF COMPLAINT:  Chief Complaint  Patient presents with   Follow-up    Rm 12, alone states she is doing well, reports 2 episodes with last week, almost fell to ground both times    INTERNAL HISTORY 06/18/2021  Patient presented for follow-up, last visit was on August 19, at that time plan was to get neuropathy labs, EEG for further evaluation.  EEG completed, did not show any epileptiform discharge, no seizures.  Her neuropathy labs including hemoglobin A1c, TSH and vitamin B12 were all within normal limits; her vitamin D level was low but she is on vitamin D supplement.  Patient reports since last visit she had 2 near falls and they both occurred after sudden turn, she lost her equilibrium but was able to catch herself.  No other injuries.  She does complain of pain in the lower extremities associated with her neuropathy and left knee pain which she is scheduled to have surgery in January.  She does not use assistive device for ambulation pain.    HISTORY OF PRESENT ILLNESS:  This is a 70 year old woman with past medical history of hypertension hyperlipidemia, anxiety depression, colon cancer treated with surgery who is presented after multiple falls for the past year.  Patient stated in the past this is a having a total of 4 falls of unclear mechanism.  She mentioned for one fall, she remembers tripping and falling but the other 3 falls she did not know how it happened other than the fact that she woke up from the ground with injuries.  She denies any warning sign prior to the fall, denies any lightheadedness, no change in vision and no abnormal heartbeat prior to the fall.  Her last one was on August 6 when she had left forehead laceration requiring multiple stitches.  At that time a head CT was done no  evidence of intracranial bleeding.  She also reports urinary incontinence with 1 event.  Patient stated she lives alone no one had witnessed the falls.  She reported no confusion after falls.  She reports following up with a cardiologist had and was told that everything was normal including an echocardiogram of the heart.   Patient also reports a balance problem, she cannot stand on one foot, reports that when shutting eye, she sway, report some numbness in the legs.   OTHER MEDICAL CONDITIONS: No reported heart disease, HTN, HLD, Anxiety/Depression, history of cancer treated with surgery    REVIEW OF SYSTEMS: Full 14 system review of systems performed and negative with exception of: as noted in the HPI  ALLERGIES: Allergies  Allergen Reactions   Benadryl [Diphenhydramine Hcl] Nausea And Vomiting and Other (See Comments)    Migraine headaches.   Lansoprazole Nausea And Vomiting and Other (See Comments)    Migraine headache   Morphine Sulfate Nausea And Vomiting and Other (See Comments)    Migraine headaches.   Penicillins Nausea And Vomiting and Other (See Comments)    INTOLERANCE >  NAUSEA & VOMITING YEAST INFECTION > CHANGE IN NORMAL FLORA  Has patient had a PCN reaction causing immediate rash, facial/tongue/throat swelling, SOB or lightheadedness with hypotension:No Has patient had a PCN reaction causing severe rash involving mucus membranes or skin necrosis: No Has patient had a PCN reaction that required hospitalization No Has patient had a PCN reaction occurring  within the last 10 years:No    Red Dye Other (See Comments)    Also allergic to [facility] Dye-migraine headaches.   Simvastatin Nausea And Vomiting and Other (See Comments)    Migraine    Codeine Nausea And Vomiting   Shellfish Allergy Other (See Comments)    Congestion   Sulfa Antibiotics Nausea And Vomiting    HOME MEDICATIONS: Outpatient Medications Prior to Visit  Medication Sig Dispense Refill   ALPRAZolam  (XANAX) 0.5 MG tablet 1 tablet (Patient not taking: Reported on 03/20/2021)     amitriptyline (ELAVIL) 25 MG tablet Take 100 mg by mouth at bedtime.     ARIPiprazole (ABILIFY) 15 MG tablet Take 1 tablet by mouth daily.     calcium carbonate (TUMS - DOSED IN MG ELEMENTAL CALCIUM) 500 MG chewable tablet Chew 2 tablets by mouth 4 (four) times daily as needed for indigestion or heartburn.     Cholecalciferol (VITAMIN D) 2000 units CAPS Take 2,000 Units by mouth at bedtime.     Cyanocobalamin (VITAMIN B-12) 5000 MCG TBDP Take 5,000 mcg by mouth at bedtime.     losartan (COZAAR) 25 MG tablet Take 1 tablet (25 mg total) by mouth daily. 90 tablet 3   metFORMIN (GLUCOPHAGE-XR) 500 MG 24 hr tablet 1,000 mg.     PARoxetine (PAXIL) 20 MG tablet 1 tablet in the morning     ranitidine (ZANTAC) 150 MG tablet Take 150 mg by mouth daily as needed (for acid reflux/heartburn).      rosuvastatin (CRESTOR) 20 MG tablet TAKE 1 TABLET BY MOUTH AT BEDTIME 90 tablet 3   vitamin E 1000 UNIT capsule Take 1,000 Units by mouth at bedtime.     albuterol (VENTOLIN HFA) 108 (90 Base) MCG/ACT inhaler Inhale 2 puffs into the lungs every 6 (six) hours as needed for wheezing or shortness of breath. (Patient not taking: Reported on 03/20/2021) 8 g 2   Tiotropium Bromide Monohydrate (SPIRIVA RESPIMAT) 2.5 MCG/ACT AERS Inhale 2 puffs into the lungs daily. 4 g 0   No facility-administered medications prior to visit.    PAST MEDICAL HISTORY: Past Medical History:  Diagnosis Date   Alcohol abuse, in remission    Cancer of sigmoid colon ([place]) 07/2005   [id number]   Chronic bronchitis    Depression    Dyspnea    Gastritis    GERD (gastroesophageal reflux disease)    History of hiatal hernia    Hyperlipidemia    Hypertension    Hypothyroid    no meds   Migraines    OA (osteoarthritis)    PONV (postoperative nausea and vomiting)    Reflux     PAST SURGICAL HISTORY: Past Surgical History:  Procedure Laterality Date    ABDOMINAL EXPOSURE N/A 11/24/2016   Procedure: ABDOMINAL EXPOSURE;  Surgeon: Rosetta Posner, MD;  Location: [facility];  Service: Vascular;  Laterality: N/A;   ABDOMINAL HYSTERECTOMY     ANTERIOR LUMBAR FUSION Bilateral 11/24/2016   Procedure: LUMBAR 5-SACRUM 1 ANTERIOR LUMBAR INTERBODY FUSION;  Surgeon: Phylliss Bob, MD;  Location: [facility];  Service: Orthopedics;  Laterality: Bilateral;  LUMBAR 5-SACRUM 1 ANTERIOR LUMBAR INTERBODY FUSION    APPENDECTOMY     CHOLECYSTECTOMY     COLON RESECTION     COLONOSCOPY     HAND SURGERY     repair of incisional hernia     SHOULDER SURGERY     both shoulders   TONSILLECTOMY      FAMILY HISTORY: Family History  Problem Relation Age of Onset   Heart disease Mother        Previous MI at age 20-55   Atrial fibrillation Mother    Stroke Mother    Heart failure Mother    Seizures Father        poss d/t alcohol   Cancer Father 31       Prostate cancer    Diabetes Sister    Arrhythmia Sister    Cancer Maternal Aunt        lung cancer    Cancer Maternal Uncle        kidney ca    SOCIAL HISTORY: Social History   Socioeconomic History   Marital status: Soil scientist    Spouse name: Not on file   Number of children: 0   Years of education: Not on file   Highest education level: Some college, no degree  Occupational History   Occupation: pet care  Tobacco Use   Smoking status: Every Day    Packs/day: 0.50    Years: 50.00    Pack years: 25.00    Types: Cigarettes, E-cigarettes    Start date: 1971   Smokeless tobacco: Never   Tobacco comments:    0.5 ppd of cigerettes , using e cigarette   Substance and Sexual Activity   Alcohol use: No    Comment: recovering  alcoholic   Drug use: No   Sexual activity: Not on file  Other Topics Concern   Not on file  Social History Narrative   Caffeine 3 cups per day    Lives with Partner Dawn   Social Determinants of Health   Financial Resource Strain: Not on file  Food Insecurity: Not on file   Transportation Needs: Not on file  Physical Activity: Not on file  Stress: Not on file  Social Connections: Not on file  Intimate Partner Violence: Not on file     PHYSICAL EXAM GENERAL EXAM/CONSTITUTIONAL: Vitals:  Vitals:   06/18/21 1245  BP: (!) 151/96  Pulse: (!) 102  Weight: 176 lb (79.8 kg)  Height: 5\' 5"  (1.651 m)   Body mass index is 29.29 kg/m. Wt Readings from Last 3 Encounters:  06/18/21 176 lb (79.8 kg)  03/20/21 176 lb 8 oz (80.1 kg)  12/24/20 177 lb 12.8 oz (80.6 kg)   Patient is in no distress; well developed, nourished and groomed; neck is supple  EYES: Pupils round and reactive to light, Visual fields full to confrontation, Extraocular movements intacts,   MUSCULOSKELETAL: Gait, strength, tone, movements noted in Neurologic exam below  NEUROLOGIC: MENTAL STATUS:  awake, alert, oriented to person, place and time recent and remote memory intact normal attention and concentration language fluent, comprehension intact, naming intact fund of knowledge appropriate  CRANIAL NERVE:  2nd, 3rd, 4th, 6th - pupils equal and reactive to light, visual fields full to confrontation, extraocular muscles intact, no nystagmus 5th - facial sensation symmetric 7th - facial strength symmetric 8th - hearing intact 9th - palate elevates symmetrically, uvula midline 11th - shoulder shrug symmetric 12th - tongue protrusion midline  MOTOR:  normal bulk and tone, full strength in the BUE, BLE  SENSORY:  Decrease sensation to vibration and pinprick to the lower extremities up to ankle. Positive Romberg.  COORDINATION:  finger-nose-finger, fine finger movements normal  REFLEXES:  deep tendon reflexes present and symmetric  GAIT/STATION:  Positive Romberg, wide based gait. Unable to tandem gait   DIAGNOSTIC DATA (LABS, IMAGING, TESTING) - I reviewed patient  records, labs, notes, testing and imaging myself where available.  Lab Results  Component Value Date    WBC 11.9 (H) 04/14/2020   HGB 14.9 04/14/2020   HCT 43.7 04/14/2020   MCV 97.3 04/14/2020   PLT 274 04/14/2020      Component Value Date/Time   NA 138 04/14/2020 1716   NA 139 09/10/2016 1443   K 3.9 04/14/2020 1716   K 3.7 09/10/2016 1443   CL 102 04/14/2020 1716   CO2 26 04/14/2020 1716   CO2 29 09/10/2016 1443   GLUCOSE 155 (H) 04/14/2020 1716   GLUCOSE 94 09/10/2016 1443   BUN 16 04/14/2020 1716   BUN 20.2 09/10/2016 1443   CREATININE 0.80 04/14/2020 1716   CREATININE 0.9 09/10/2016 1443   CALCIUM 9.3 04/14/2020 1716   CALCIUM 10.3 09/10/2016 1443   PROT 7.5 04/14/2020 1716   PROT 7.6 09/10/2016 1443   ALBUMIN 4.1 04/14/2020 1716   ALBUMIN 4.1 09/10/2016 1443   AST 19 04/14/2020 1716   AST 14 09/10/2016 1443   ALT 11 04/14/2020 1716   ALT 8 09/10/2016 1443   ALKPHOS 80 04/14/2020 1716   ALKPHOS 88 09/10/2016 1443   BILITOT 0.4 04/14/2020 1716   BILITOT 0.42 09/10/2016 1443   GFRNONAA >60 04/14/2020 1716   GFRAA >60 04/14/2020 1716   No results found for: CHOL, HDL, LDLCALC, LDLDIRECT, TRIG, CHOLHDL Lab Results  Component Value Date   HGBA1C 6.6 (H) 03/20/2021   Lab Results  Component Value Date   VITAMINB12 438 03/20/2021   Lab Results  Component Value Date   TSH 2.780 03/20/2021     Head CT 03/07/2021: 1.  No evidence for acute intracranial abnormality. 2. LEFT frontal scalp laceration not associated fracture. 3. Chronic paranasal sinus changes. 4. Mid cervical degenerative changes without acute cervical spine injury. 5. Re-demonstrated LEFT thyroid nodule for which biopsy has been recommended.   Routine EEG 8/31: This is a normal EEG recording in the waking and drowsy state. No evidence of interictal epileptiform discharges seen. A normal EEG does not exclude a diagnosis of epilepsy.  ASSESSMENT AND PLAN  70 y.o. year old female with past medical history of hypertension, hyperlipidemia, anxiety depression, vitamin D deficiency, and peripheral  neuropathy who is presenting for follow-up regarding her multiple falls.  Since last visit August she had 2 near falls in the setting of rapid body turn, denies any injuries, her reversible cause of neuropathy labs were within normal limit except low vitamin D level for which she is taking supplement and her EEG was negative for abnormal discharges.  Her gait instability is multifactorial including peripheral neuropathy, bilateral knees pain and deconditioning.  I will refer her to physical therapy for gait assessment and training and also start her on low-dose gabapentin for neuropathic pain.  Follow-up with your primary care doctor and return in 6 months.    1. Fall, subsequent encounter   2. Other polyneuropathy     PLAN: Will try Gabapentin 100 mg nightly for peripheral neuropathy. Ask patient to monitor for somnolence and allergic reaction. Can increase to 100 mg twice a day as tolerated.  Referral to physical therapy for gait training. Still having falls and near falls.  Return in 6 months    Orders Placed This Encounter  Procedures   Ambulatory referral to Physical Therapy     Meds ordered this encounter  Medications   gabapentin (NEURONTIN) 100 MG capsule    Sig: Take 1 capsule (100 mg total) by  mouth 3 (three) times daily.    Dispense:  90 capsule    Refill:  0     Return in about 6 months (around 12/16/2021).    Alric Ran, MD 06/18/2021, 1:18 PM  [facility] [address] [place], [place] [address] [phone]

## 2021-06-18 NOTE — Patient Instructions (Signed)
Will try Gabapentin 100 mg nightly for peripheral neuropathy. Ask patient to monitor for somnolence and allergic reaction. Can increase to 100 mg twice a day as tolerated.  Referral to physical therapy for gait training. Still having falls and near falls.  Return in 6 months

## 2021-06-23 ENCOUNTER — Telehealth: Payer: Self-pay | Admitting: Pulmonary Disease

## 2021-06-23 ENCOUNTER — Telehealth: Payer: Self-pay | Admitting: *Deleted

## 2021-06-23 NOTE — Telephone Encounter (Signed)
Call returned to patient, confirmed DOB. Patient states her PCP told her to call her cardiologist to make an appt for surgical clearance. I made her aware this is Pulmonology. She states she would also like to know whether she needs surgical clearance from [place]. I made her aware she needs to contact the doctor doing the surgery and if she needs one we can set up an appt since she has not been seen since May. Patient continues to state she needs to know whether she needs surgical clearance. Appt made.   Nothing further needed at this time.

## 2021-06-23 NOTE — Telephone Encounter (Signed)
   [facility] Pre-operative Risk Assessment    Patient Name: [name]  DOB: 01-Feb-1951 MRN: [id number]  HEARTCARE STAFF:  - IMPORTANT!!!!!! Under Visit Info/Reason for Call, type in Other and utilize the format Clearance MM/DD/YY or Clearance TBD. Do not use dashes or single digits. - Please review there is not already an duplicate clearance open for this procedure. - If request is for dental extraction, please clarify the # of teeth to be extracted. - If the patient is currently at the dentist's office, call Pre-Op Callback Staff (MA/nurse) to input urgent request.  - If the patient is not currently in the dentist office, please route to the Pre-Op pool.  Request for surgical clearance:  What type of surgery is being performed? Left total knee replacement  When is this surgery scheduled? TBD  What type of clearance is required (medical clearance vs. Pharmacy clearance to hold med vs. Both)? medical  Are there any medications that need to be held prior to surgery and how long? none  Practice name and name of physician performing surgery? [facility]  What is the office phone number? [phone]   7.   What is the office fax number? [phone]  8.   Anesthesia type (None, local, MAC, general) ? Not listed   Fredia Beets 06/23/2021, 3:53 PM  _________________________________________________________________   (provider comments below)

## 2021-06-24 ENCOUNTER — Ambulatory Visit: Payer: Medicare Other

## 2021-06-24 NOTE — Telephone Encounter (Signed)
    Patient Name: [name]  DOB: 1951/03/23 MRN: [id number]  Primary Cardiologist: Dr. Audie Box  Chart reviewed as part of pre-operative protocol coverage. Patient was last seen by Dr. Audie Box in 11/2020. She was contacted today for additional pre-op evaluation and reported doing well since last visit. She denies any chest pain, new or worsening shortness of breath, orthopnea/PND, edema, palpitations, syncope. She is able to complete >4.0 METS of activity without any angina or significant shortness of breath. Given past medical history and time since last visit, based on ACC/AHA guidelines, [name] would be at acceptable risk for the planned procedure without further cardiovascular testing.   I will route this recommendation to the requesting party via Epic fax function and remove from pre-op pool.  Please call with questions.  Darreld Mclean, PA-C 06/24/2021, 8:46 AM

## 2021-06-29 ENCOUNTER — Ambulatory Visit: Payer: Medicare Other | Attending: Family Medicine

## 2021-06-29 ENCOUNTER — Other Ambulatory Visit: Payer: Self-pay

## 2021-06-29 ENCOUNTER — Ambulatory Visit: Payer: Medicare Other

## 2021-06-29 ENCOUNTER — Telehealth: Payer: Self-pay | Admitting: Pulmonary Disease

## 2021-06-29 DIAGNOSIS — M1990 Unspecified osteoarthritis, unspecified site: Secondary | ICD-10-CM | POA: Insufficient documentation

## 2021-06-29 DIAGNOSIS — M6281 Muscle weakness (generalized): Secondary | ICD-10-CM | POA: Insufficient documentation

## 2021-06-29 DIAGNOSIS — R2681 Unsteadiness on feet: Secondary | ICD-10-CM | POA: Diagnosis not present

## 2021-06-29 DIAGNOSIS — R2689 Other abnormalities of gait and mobility: Secondary | ICD-10-CM | POA: Diagnosis not present

## 2021-06-29 DIAGNOSIS — R262 Difficulty in walking, not elsewhere classified: Secondary | ICD-10-CM | POA: Diagnosis present

## 2021-06-29 DIAGNOSIS — E039 Hypothyroidism, unspecified: Secondary | ICD-10-CM | POA: Diagnosis not present

## 2021-06-29 DIAGNOSIS — W19XXXD Unspecified fall, subsequent encounter: Secondary | ICD-10-CM | POA: Diagnosis not present

## 2021-06-29 DIAGNOSIS — E785 Hyperlipidemia, unspecified: Secondary | ICD-10-CM | POA: Diagnosis not present

## 2021-06-29 DIAGNOSIS — R42 Dizziness and giddiness: Secondary | ICD-10-CM | POA: Insufficient documentation

## 2021-06-29 DIAGNOSIS — F419 Anxiety disorder, unspecified: Secondary | ICD-10-CM | POA: Insufficient documentation

## 2021-06-29 DIAGNOSIS — G629 Polyneuropathy, unspecified: Secondary | ICD-10-CM | POA: Insufficient documentation

## 2021-06-29 DIAGNOSIS — Z9181 History of falling: Secondary | ICD-10-CM | POA: Diagnosis not present

## 2021-06-29 DIAGNOSIS — Z85038 Personal history of other malignant neoplasm of large intestine: Secondary | ICD-10-CM | POA: Diagnosis not present

## 2021-06-29 DIAGNOSIS — F1011 Alcohol abuse, in remission: Secondary | ICD-10-CM | POA: Diagnosis not present

## 2021-06-29 DIAGNOSIS — F32A Depression, unspecified: Secondary | ICD-10-CM | POA: Diagnosis not present

## 2021-06-29 DIAGNOSIS — I10 Essential (primary) hypertension: Secondary | ICD-10-CM | POA: Diagnosis not present

## 2021-06-29 NOTE — Telephone Encounter (Signed)
Fax received from Dr. Edmonia Lynch from [facility] to perform a Lt total knee replacement on patient.  Patient needs surgery clearance. Patient was seen on 12/12/2020 and has an upcoming appointment on 07/09/2022. Office protocol is a risk assessment can be sent to surgeon if patient has been seen in 60 days or less.   Sending to Dr. Loanne Drilling for risk assessment to be done at her appt on 07/09/22

## 2021-06-29 NOTE — Telephone Encounter (Signed)
Noted  

## 2021-06-29 NOTE — Therapy (Signed)
[facility] [address] [place], [place], [address] Phone: [phone]   Fax:  [phone]  Physical Therapy Evaluation  Patient Details  Name: [name] MRN: [id number] Date of Birth: [birth year] Referring Provider (PT): Alric Ran, MD   Encounter Date: 06/29/2021   PT End of Session - 06/29/21 1046     Visit Number 1    Number of Visits 17    Date for PT Re-Evaluation 08/28/21    Authorization Type Medicare part A and part B/ BCBS    Progress Note Due on Visit 10    PT Start Time 0940    PT Stop Time 1020    PT Time Calculation (min) 40 min    Equipment Utilized During Treatment Gait belt    Activity Tolerance Patient tolerated treatment well    Behavior During Therapy WFL for tasks assessed/performed             Past Medical History:  Diagnosis Date   Alcohol abuse, in remission    Cancer of sigmoid colon ([place]) 07/2005   T2N0M0   Chronic bronchitis    Depression    Dyspnea    Gastritis    GERD (gastroesophageal reflux disease)    History of hiatal hernia    Hyperlipidemia    Hypertension    Hypothyroid    no meds   Migraines    OA (osteoarthritis)    PONV (postoperative nausea and vomiting)    Reflux     Past Surgical History:  Procedure Laterality Date   ABDOMINAL EXPOSURE N/A 11/24/2016   Procedure: ABDOMINAL EXPOSURE;  Surgeon: Rosetta Posner, MD;  Location: [facility];  Service: Vascular;  Laterality: N/A;   ABDOMINAL HYSTERECTOMY     ANTERIOR LUMBAR FUSION Bilateral 11/24/2016   Procedure: LUMBAR 5-SACRUM 1 ANTERIOR LUMBAR INTERBODY FUSION;  Surgeon: Phylliss Bob, MD;  Location: [facility];  Service: Orthopedics;  Laterality: Bilateral;  LUMBAR 5-SACRUM 1 ANTERIOR LUMBAR INTERBODY FUSION    APPENDECTOMY     CHOLECYSTECTOMY     COLON RESECTION     COLONOSCOPY     HAND SURGERY     repair of incisional hernia     SHOULDER SURGERY     both shoulders   TONSILLECTOMY      There were no  vitals filed for this visit.    Subjective Assessment - 06/29/21 0945     Subjective Pt reports falling 4 times in the past six months but caught herself the other times. Pt states she has gotten stitches and injuries due to falls. Pt states she has neuropathy and is getting a knee replacement in January. Pt reports her last fall was August 6th. Pt reports she mostly loses her balance forward.    Patient Stated Goals Improve balance to not fall    Currently in Pain? Yes    Pain Score 5     Pain Location Knee    Pain Orientation Right;Left    Pain Descriptors / Indicators Aching;Shooting    Pain Type Chronic pain    Pain Onset More than a month ago    Aggravating Factors  Crawling around on the floor makes them worse, she states she has to use a pillow for this    Pain Relieving Factors Propping them up and resting them                [facility] PT Assessment - 06/29/21 0950       Assessment  Medical Diagnosis Falls    Referring Provider (PT) Alric Ran, MD    Onset Date/Surgical Date 08/18/20    Prior Therapy Not for falls or knees      Precautions   Precautions Fall      Balance Screen   Has the patient fallen in the past 6 months Yes    How many times? 4 and 2 almost falls    Has the patient had a decrease in activity level because of a fear of falling?  Yes    Is the patient reluctant to leave their home because of a fear of falling?  No      Home Environment   Living Environment Private residence    Living Arrangements Spouse/significant other   significant other has MS   Available Help at Discharge Friend(s);Family    Type of [place] Access Other (comment)   step to get into the house and steps to go off the deck   Home Layout Two level    Alternate Level Stairs-Number of Steps 1 step up front and 10-12 steps    Alternate Level Stairs-Rails Right;Left   Up to platform and then two more steps w/o   [place] - 2 wheels;Walker - 4  wheels;Cane - single point;Grab bars - toilet;Grab bars - tub/shower;Shower seat      Prior Function   Level of Independence Independent    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     Cognition   Overall Cognitive Status Within Functional Limits for tasks assessed      Sensation   Light Touch Appears Intact    Additional Comments History of neuropathy      ROM / Strength   AROM / PROM / Strength Strength      Strength   Strength Assessment Site Hip;Knee;Ankle    Right/Left Hip Right;Left    Right Hip Flexion 3+/5    Right Hip ABduction 4+/5    Right Hip ADduction 5/5    Left Hip Flexion 3+/5    Left Hip ABduction 4+/5    Left Hip ADduction 5/5    Right/Left Knee Right;Left    Right Knee Flexion 3+/5    Right Knee Extension 3+/5   w/ pain   Left Knee Flexion 3+/5    Left Knee Extension 3+/5    Right/Left Ankle Right;Left    Right Ankle Dorsiflexion 4+/5    Left Ankle Dorsiflexion 4-/5      Transfers   Transfers Sit to Stand;Stand to Sit    Sit to Stand 7: Independent    Five time sit to stand comments  12.18 sec w/o hands from standard height chair    Stand to Sit 7: Independent      Ambulation/Gait   Ambulation/Gait Yes    Ambulation/Gait Assistance 5: Supervision    Ambulation Distance (Feet) 75 Feet   throughout clinic   Assistive device None    Gait Pattern Step-through pattern;Decreased step length - left;Decreased step length - right    Ambulation Surface Level;Indoor    Gait velocity 13.03 seconds, 2.51 ft/sec      Balance   Balance Assessed Yes      Standardized Balance Assessment   Standardized Balance Assessment Berg Balance Test;Timed Up and Go Test;Five Times Sit to Stand      Berg Balance Test   Sit to Stand Able to stand without using hands and stabilize independently    Standing Unsupported Able  to stand safely 2 minutes   Pt states she feels like she is swaying   Sitting with Back Unsupported but Feet Supported on Floor or Stool  Able to sit safely and securely 2 minutes    Stand to Sit Sits safely with minimal use of hands    Transfers Able to transfer safely, minor use of hands    Standing Unsupported with Eyes Closed Able to stand 10 seconds safely   Pt states increased sway feeling   Standing Unsupported with Feet Together Able to place feet together independently and stand 1 minute safely    From Standing, Reach Forward with Outstretched Arm Can reach forward >12 cm safely (5")   mild LOB at end, but able to correct without assistance   From Standing Position, Pick up Object from Floor Able to pick up shoe safely and easily    From Standing Position, Turn to Look Behind Over each Shoulder Turn sideways only but maintains balance    Turn 360 Degrees Able to turn 360 degrees safely in 4 seconds or less   slight dizziness, "off blance" more than dizziness   Standing Unsupported, Alternately Place Feet on Step/Stool Able to stand independently and safely and complete 8 steps in 20 seconds   increased postural sway   Standing Unsupported, One Foot in Front Able to take small step independently and hold 30 seconds    Standing on One Leg Tries to lift leg/unable to hold 3 seconds but remains standing independently    Total Score 48      Timed Up and Go Test   TUG Normal TUG    Normal TUG (seconds) 9.69                        Objective measurements completed on examination: See above findings.                PT Education - 06/29/21 1051     Education Details PT educated pt on POC    Person(s) Educated Patient    Methods Explanation    Comprehension Verbalized understanding              PT Short Term Goals - 06/29/21 1113       PT SHORT TERM GOAL #1   Title Pt will be independent w/ initial HEP focused on balance.    Time 4    Period Weeks    Status New    Target Date 07/31/21      PT SHORT TERM GOAL #2   Title Pt will be able to perform 5x sit <> stand in </= 10 seconds to  demonstrate a decreased fall risk.    Baseline 06/29/21- 12.18 sec w/o hands from standard chair    Time 4    Period Weeks    Status New    Target Date 07/31/21      PT SHORT TERM GOAL #3   Title Pt will be able to ambulate at >/= 2.62 ft/sec for improved community ambulation.    Baseline 06/29/21- 2.51 ft/sec w/o AD    Time 4    Period Weeks    Status New    Target Date 07/31/21      PT SHORT TERM GOAL #4   Title Pt will be able to ambulate 500' over unlevel surfaces w/ LRAD vs no AD for improved functional mobility and community ambulation.    Time 4    Period Weeks  Status New    Target Date 07/31/21               PT Long Term Goals - 06/29/21 1122       PT LONG TERM GOAL #1   Title Pt will be independent w/ final balance focused HEP.    Time 8    Period Weeks    Status New    Target Date 08/28/21      PT LONG TERM GOAL #2   Title FGA Goal TBD    Time 8    Period Weeks    Status New    Target Date 08/28/21      PT LONG TERM GOAL #3   Title Modified CTSIB Goal TBD    Time 8    Period Weeks    Status New    Target Date 08/28/21      PT LONG TERM GOAL #4   Title Pt will be able to ambulate 1000' over varied surfaces w/ LRAD vs no AD for improved community ambulation.    Time 8    Period Weeks    Status New    Target Date 08/28/21                    Plan - 06/29/21 1039     Clinical Impression Statement Pt presents to the clinic w/ a history of falls. Pt has a past medical history of neuropathy, hypertension, hyperlipidemia, anxiety, depression, colon cancer treated with surgery, OA, hypothyroid, history of alcohol abuse but in remission. Pt presents to the clinic as a fall risk despite scoring a 48/56 on the Berg Balance Test and a 9.69 second on the TUG. PT will conduct alternate balance assessments to further challenge pt's balance in a safe setting. Pt demonstrates deficits in ambulation as her gait speed is 2.56ft/sec denoting her as a  limited community ambulator. Pt performed the 5x sit <> stand in 12.18 seconds which demonstrates an increased fall risk. Skilled PT is required to address the deficits listed above and to work towards pt achievement of STG/LTG.    Personal Factors and Comorbidities Comorbidity 3+    Comorbidities Past medical history of neuropathy, hypertension, hyperlipidemia, anxiety, depression, colon cancer treated with surgery, OA, hypothyroid, history of alcohol abuse but in remission.    Examination-Activity Limitations Caring for Others;Locomotion Level;Stairs;Carry    Examination-Participation Restrictions Yard Work;Occupation;Community Activity;Cleaning    Stability/Clinical Decision Making Evolving/Moderate complexity    Clinical Decision Making Moderate    Rehab Potential Good    PT Frequency 2x / week   plus eval   PT Duration 8 weeks    PT Treatment/Interventions ADLs/Self Care Home Management;Aquatic Therapy;Moist Heat;Cryotherapy;Gait training;Functional mobility training;Therapeutic activities;Therapeutic exercise;Balance training;Manual techniques;Neuromuscular re-education;Passive range of motion;Vestibular;Patient/family education;Canalith Repostioning;Stair training;DME Instruction    PT Next Visit Plan Conduct FGA and modified CTSIB and update goals based upon results, Check stairs. Initiate Balance HEP    Consulted and Agree with Plan of Care Patient             Patient will benefit from skilled therapeutic intervention in order to improve the following deficits and impairments:  Decreased balance, Abnormal gait, Dizziness, Decreased strength, Difficulty walking, Hypomobility, Impaired sensation, Decreased range of motion, Decreased endurance, Pain  Visit Diagnosis: Difficulty in walking, not elsewhere classified  Unsteadiness on feet  Other abnormalities of gait and mobility  Muscle weakness (generalized)  Dizziness and giddiness     Problem List Patient Active Problem  List  Diagnosis Date Noted   Centrilobular emphysema ([place]) 12/13/2020   Radiculopathy 11/24/2016   H/O colon cancer, stage I 01/19/2013   Nonspecific abnormal electrocardiogram (ECG) (EKG) 03/31/2011   Chest pain 03/31/2011   Hyperlipidemia 03/31/2011   Hypertension 03/31/2011   Tobacco abuse 03/31/2011    Lottie Mussel, Student-PT 06/29/2021, 11:46 AM  [facility] [address] [place], [place], [address] Phone: [phone]   Fax:  [phone]  Name: SHUNTIA EXTON MRN: [id number] Date of Birth: January 08, 1951

## 2021-06-30 DIAGNOSIS — E1169 Type 2 diabetes mellitus with other specified complication: Secondary | ICD-10-CM | POA: Diagnosis not present

## 2021-06-30 DIAGNOSIS — Z79899 Other long term (current) drug therapy: Secondary | ICD-10-CM | POA: Diagnosis not present

## 2021-06-30 DIAGNOSIS — Z01818 Encounter for other preprocedural examination: Secondary | ICD-10-CM | POA: Diagnosis not present

## 2021-06-30 DIAGNOSIS — Z7984 Long term (current) use of oral hypoglycemic drugs: Secondary | ICD-10-CM | POA: Diagnosis not present

## 2021-06-30 DIAGNOSIS — M1990 Unspecified osteoarthritis, unspecified site: Secondary | ICD-10-CM | POA: Diagnosis not present

## 2021-07-02 ENCOUNTER — Encounter: Payer: Self-pay | Admitting: Pulmonary Disease

## 2021-07-02 ENCOUNTER — Other Ambulatory Visit: Payer: Self-pay

## 2021-07-02 ENCOUNTER — Ambulatory Visit (INDEPENDENT_AMBULATORY_CARE_PROVIDER_SITE_OTHER): Payer: Medicare Other | Admitting: Pulmonary Disease

## 2021-07-02 VITALS — BP 140/80 | HR 94 | Temp 97.8°F | Ht 65.0 in | Wt 179.4 lb

## 2021-07-02 DIAGNOSIS — Z01811 Encounter for preprocedural respiratory examination: Secondary | ICD-10-CM

## 2021-07-02 DIAGNOSIS — J432 Centrilobular emphysema: Secondary | ICD-10-CM

## 2021-07-02 DIAGNOSIS — F17210 Nicotine dependence, cigarettes, uncomplicated: Secondary | ICD-10-CM | POA: Diagnosis not present

## 2021-07-02 DIAGNOSIS — Z720 Tobacco use: Secondary | ICD-10-CM

## 2021-07-02 NOTE — Patient Instructions (Addendum)
Peri-operative Assessment of Pulmonary Risk for Non-Thoracic Surgery: For Ms. Lucia, risk of perioperative pulmonary complications is increased by: Age greater than 38 years, COPD, Smoking  Respiratory complications generally occur in 1% of ASA Class I patients, 5% of ASA Class II and 10% of ASA Class III-IV patients These complications rarely result in mortality and include postoperative pneumonia, atelectasis, pulmonary embolism, ARDS and increased time requiring postoperative mechanical ventilation.  Overall, I recommend proceeding with the surgery if the risk for respiratory complications are outweighed by the potential benefits. This will need to be discussed between the patient and surgeon. To reduce risks of respiratory complications, I recommend: --Smoking cessation 6-8 weeks if possible --Pre- and post-operative incentive spirometry performed frequently while awake --Avoiding use of pancuronium during anesthesia.   Mild centrilobular emphysema/COPD --Unable to tolerate HFA or powdered inhalers.  --Symptoms are mild. No clinical indication for bronchodilators --Continue regular activity daily  Tobacco abuse Patient is an active smoker. We discussed smoking cessation for 5 minutes. We discussed triggers and stressors and ways to deal with them. We discussed barriers to continued smoking and benefits of smoking cessation. Provided patient with information cessation techniques and interventions including [place] quitline.  Follow-up with me in 6 months

## 2021-07-02 NOTE — Progress Notes (Signed)
Subjective:   PATIENT ID: [name] GENDER: female DOB: July 27, 1951, MRN: [id number]   HPI  Chief Complaint  Patient presents with   Follow-up    Surgical clearance     Reason for Visit: Follow-up  [name] is a 70 year old active smoker with hx of colon cancer s/p surgical resection (no chemoradiation), HTN and HLD who presents for follow-up.  Initial consult 11/2020 She was recently evaluated with Cardiology for shortness of breath. Note by Dr. Audie Box reviewed and work-up negative for cardiac etiology. She was started on albuterol inhaler. However she reports she has not used it. She reports when she is performing heavy exertion, she will become short of breath. She can walk two blocks without issue. Occasional cough. Denies wheezing. Denies nocturnal symptoms though she sleeps reclined due to reflux related to her hiatal hernia. She would like to be more active without her shortness of breath limiting her.   07/02/21 Since our last visit, she was started on Spiriva however discontinued due to coughing. She is planning for a total knee replacement and seeking pulmonary pre-op evaluation. Her activity is limited by her knee. Denies shortness of breath unless she is doing significant activity. Usually her knee will stop first. She is able to grocery shop with stopping. Able to lift items without issue. Denies wheezing or coughing. She is currently smoking 1/2 ppd.  Social History: Active smoker, 99pack-years. 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    Past Medical History:  Diagnosis Date   Alcohol abuse, in remission    Cancer of sigmoid colon ([place]) 07/2005   T2N0M0   Chronic bronchitis    Depression    Dyspnea    Gastritis    GERD (gastroesophageal reflux disease)    History of hiatal hernia    Hyperlipidemia    Hypertension    Hypothyroid    no meds   Migraines    OA  (osteoarthritis)    PONV (postoperative nausea and vomiting)    Reflux      Family History  Problem Relation Age of Onset   Heart disease Mother        Previous MI at age 24-55   Atrial fibrillation Mother    Stroke Mother    Heart failure Mother    Seizures Father        poss d/t alcohol   Cancer Father 104       Prostate cancer    Diabetes Sister    Arrhythmia Sister    Cancer Maternal Aunt        lung cancer    Cancer Maternal Uncle        kidney ca     Social History   Occupational History   Occupation: pet care  Tobacco Use   Smoking status: Every Day    Packs/day: 0.50    Years: 50.00    Pack years: 25.00    Types: Cigarettes, E-cigarettes    Start date: 1971   Smokeless tobacco: Never   Tobacco comments:    0.5 ppd of cigerettes , using e cigarette   Substance and Sexual Activity   Alcohol use: No    Comment: recovering  alcoholic   Drug use: No   Sexual activity: Not on file    Allergies  Allergen Reactions   Benadryl [Diphenhydramine Hcl] Nausea And Vomiting and Other (See Comments)    Migraine headaches.  Lansoprazole Nausea And Vomiting and Other (See Comments)    Migraine headache   Morphine Sulfate Nausea And Vomiting and Other (See Comments)    Migraine headaches.   Penicillins Nausea And Vomiting and Other (See Comments)    INTOLERANCE >  NAUSEA & VOMITING YEAST INFECTION > CHANGE IN NORMAL FLORA  Has patient had a PCN reaction causing immediate rash, facial/tongue/throat swelling, SOB or lightheadedness with hypotension:No Has patient had a PCN reaction causing severe rash involving mucus membranes or skin necrosis: No Has patient had a PCN reaction that required hospitalization No Has patient had a PCN reaction occurring within the last 10 years:No    Red Dye Other (See Comments)    Also allergic to [facility] Dye-migraine headaches.   Simvastatin Nausea And Vomiting and Other (See Comments)    Migraine    Codeine Nausea And Vomiting    Shellfish Allergy Other (See Comments)    Congestion   Sulfa Antibiotics Nausea And Vomiting     Outpatient Medications Prior to Visit  Medication Sig Dispense Refill   ALPRAZolam (XANAX) 0.5 MG tablet 1 tablet (Patient not taking: Reported on 03/20/2021)     amitriptyline (ELAVIL) 25 MG tablet Take 100 mg by mouth at bedtime.     ARIPiprazole (ABILIFY) 15 MG tablet Take 1 tablet by mouth daily.     calcium carbonate (TUMS - DOSED IN MG ELEMENTAL CALCIUM) 500 MG chewable tablet Chew 2 tablets by mouth 4 (four) times daily as needed for indigestion or heartburn.     Cholecalciferol (VITAMIN D) 2000 units CAPS Take 2,000 Units by mouth at bedtime.     Cyanocobalamin (VITAMIN B-12) 5000 MCG TBDP Take 5,000 mcg by mouth at bedtime.     gabapentin (NEURONTIN) 100 MG capsule Take 1 capsule (100 mg total) by mouth 3 (three) times daily. 90 capsule 0   losartan (COZAAR) 25 MG tablet Take 1 tablet (25 mg total) by mouth daily. 90 tablet 3   metFORMIN (GLUCOPHAGE-XR) 500 MG 24 hr tablet 1,000 mg.     PARoxetine (PAXIL) 20 MG tablet 1 tablet in the morning     ranitidine (ZANTAC) 150 MG tablet Take 150 mg by mouth daily as needed (for acid reflux/heartburn).      rosuvastatin (CRESTOR) 20 MG tablet TAKE 1 TABLET BY MOUTH AT BEDTIME 90 tablet 3   vitamin E 1000 UNIT capsule Take 1,000 Units by mouth at bedtime.     No facility-administered medications prior to visit.    Review of Systems  Constitutional:  Negative for chills, diaphoresis, fever, malaise/fatigue and weight loss.  HENT:  Negative for congestion.   Respiratory:  Negative for cough, hemoptysis, sputum production, shortness of breath and wheezing.   Cardiovascular:  Negative for chest pain, palpitations and leg swelling.    Objective:   There were no vitals filed for this visit.     Physical Exam: General: Well-appearing, no acute distress HENT: [name], AT Eyes: EOMI, no scleral icterus Respiratory: Clear to auscultation  bilaterally.  No crackles, wheezing or rales Cardiovascular: RRR, -M/R/G, no JVD Extremities:-Edema,-tenderness Neuro: AAO x4, CNII-XII grossly intact Skin: Intact, no rashes or bruising Psych: Normal mood, normal affect  Data Reviewed:  Imaging: CT Chest Lung Screen 12/11/19 - Mild centrilobular emphysema, upper lung predominant. Subcentimeter lung nodules with RUL measuring 2.63mm and subpleural GGO up to 3.53mm. Recommend annual screen  PFT: 10/30/20 FVC 2.51 (80%) FEV1 1.93 (81%) Ratio 78  TLC 99% DLCO 73% Interpretation: Reduced DLCO. Normal spirometry.  Labs:  CBC    Component Value Date/Time   WBC 11.9 (H) 04/14/2020 1716   RBC 4.49 04/14/2020 1716   HGB 14.9 04/14/2020 1716   HGB 14.3 09/10/2016 1443   HCT 43.7 04/14/2020 1716   HCT 42.3 09/10/2016 1443   PLT 274 04/14/2020 1716   PLT 258 09/10/2016 1443   MCV 97.3 04/14/2020 1716   MCV 95.3 09/10/2016 1443   MCH 33.2 04/14/2020 1716   MCHC 34.1 04/14/2020 1716   RDW 12.8 04/14/2020 1716   RDW 12.7 09/10/2016 1443   LYMPHSABS 2.5 04/14/2020 1716   LYMPHSABS 3.4 (H) 09/10/2016 1443   MONOABS 0.9 04/14/2020 1716   MONOABS 0.7 09/10/2016 1443   EOSABS 0.1 04/14/2020 1716   EOSABS 0.3 09/10/2016 1443   BASOSABS 0.1 04/14/2020 1716   BASOSABS 0.0 09/10/2016 1443   Absolute eos  09/10/16 300 04/14/20 100    Assessment & Plan:   Discussion: 70 year old female active smoker with emphysema on CT who presents for follow-up. I have discussed the risk factors and recommendations regarding pre-op recommendations with the patient.  Mild centrilobular emphysema/COPD --Unable to tolerate HFA or powdered inhalers. Limited options --Symptoms are mild. No clinical indication for bronchodilators --Continue regular activity daily  Tobacco abuse Patient is an active smoker. We discussed smoking cessation for 5 minutes. We discussed triggers and stressors and ways to deal with them. We discussed barriers to continued smoking and  benefits of smoking cessation. Provided patient with information cessation techniques and interventions including [place] quitline.   Peri-operative Assessment of Pulmonary Risk for Non-Thoracic Surgery: For Ms. Strum, risk of perioperative pulmonary complications is increased by: Age greater than 62 years, COPD, Smoking  Respiratory complications generally occur in 1% of ASA Class I patients, 5% of ASA Class II and 10% of ASA Class III-IV patients These complications rarely result in mortality and include postoperative pneumonia, atelectasis, pulmonary embolism, ARDS and increased time requiring postoperative mechanical ventilation.  Overall, I recommend proceeding with the surgery if the risk for respiratory complications are outweighed by the potential benefits. This will need to be discussed between the patient and surgeon. To reduce risks of respiratory complications, I recommend: --Smoking cessation 6-8 weeks if possible --Pre- and post-operative incentive spirometry performed frequently while awake --Avoiding use of pancuronium during anesthesia.    Health Maintenance Immunization History  Administered Date(s) Administered   PFIZER(Purple Top)SARS-COV-2 Vaccination 10/12/2019, 11/05/2019, 05/02/2020   Tdap 10/02/2020   CT Lung Screen - due for annual screen in 11/2020  No orders of the defined types were placed in this encounter.  No orders of the defined types were placed in this encounter.   Return in about 6 months (around 12/31/2021).  I have spent a total time of 36-minutes on the day of the appointment reviewing prior documentation, coordinating care and discussing medical diagnosis and plan with the patient/family. Past medical history, allergies, medications were reviewed. Pertinent imaging, labs and tests included in this note have been reviewed and interpreted independently by me.  [place], MD [facility] 07/02/2021 8:49 AM  Office Number  [phone]

## 2021-07-05 ENCOUNTER — Encounter: Payer: Self-pay | Admitting: Pulmonary Disease

## 2021-07-08 ENCOUNTER — Other Ambulatory Visit: Payer: Self-pay

## 2021-07-08 ENCOUNTER — Ambulatory Visit: Payer: Medicare Other | Attending: Family Medicine

## 2021-07-08 DIAGNOSIS — R42 Dizziness and giddiness: Secondary | ICD-10-CM | POA: Diagnosis not present

## 2021-07-08 DIAGNOSIS — R2681 Unsteadiness on feet: Secondary | ICD-10-CM | POA: Diagnosis not present

## 2021-07-08 DIAGNOSIS — M6281 Muscle weakness (generalized): Secondary | ICD-10-CM

## 2021-07-08 DIAGNOSIS — R262 Difficulty in walking, not elsewhere classified: Secondary | ICD-10-CM | POA: Diagnosis not present

## 2021-07-08 DIAGNOSIS — R2689 Other abnormalities of gait and mobility: Secondary | ICD-10-CM | POA: Insufficient documentation

## 2021-07-08 NOTE — Therapy (Signed)
[facility] [address] [place], [place], [address] Phone: [phone]   Fax:  [phone]  Physical Therapy Treatment  Patient Details  Name: [name] MRN: [id number] Date of Birth: 10/21/1950 Referring Provider (PT): Alric Ran, MD   Encounter Date: 07/08/2021   PT End of Session - 07/08/21 1122     Visit Number 2    Number of Visits 17    Date for PT Re-Evaluation 08/28/21    Authorization Type Medicare part A and part B/ BCBS    Progress Note Due on Visit 10    PT Start Time 0850    PT Stop Time 0930    PT Time Calculation (min) 40 min    Equipment Utilized During Treatment Gait belt    Activity Tolerance Patient tolerated treatment well    Behavior During Therapy WFL for tasks assessed/performed             Past Medical History:  Diagnosis Date   Alcohol abuse, in remission    Cancer of sigmoid colon ([place]) 07/2005   T2N0M0   Chronic bronchitis    Depression    Dyspnea    Gastritis    GERD (gastroesophageal reflux disease)    History of hiatal hernia    Hyperlipidemia    Hypertension    Hypothyroid    no meds   Migraines    OA (osteoarthritis)    PONV (postoperative nausea and vomiting)    Reflux     Past Surgical History:  Procedure Laterality Date   ABDOMINAL EXPOSURE N/A 11/24/2016   Procedure: ABDOMINAL EXPOSURE;  Surgeon: Rosetta Posner, MD;  Location: [facility];  Service: Vascular;  Laterality: N/A;   ABDOMINAL HYSTERECTOMY     ANTERIOR LUMBAR FUSION Bilateral 11/24/2016   Procedure: LUMBAR 5-SACRUM 1 ANTERIOR LUMBAR INTERBODY FUSION;  Surgeon: Phylliss Bob, MD;  Location: [facility];  Service: Orthopedics;  Laterality: Bilateral;  LUMBAR 5-SACRUM 1 ANTERIOR LUMBAR INTERBODY FUSION    APPENDECTOMY     CHOLECYSTECTOMY     COLON RESECTION     COLONOSCOPY     HAND SURGERY     repair of incisional hernia     SHOULDER SURGERY     both shoulders   TONSILLECTOMY      There were no  vitals filed for this visit.   Subjective Assessment - 07/08/21 0852     Subjective Pt reports feeling about the same since the last visit. Denies any falls.    Patient Stated Goals Improve balance to not fall    Currently in Pain? Yes    Pain Score 5     Pain Location Knee    Pain Orientation Right;Left    Pain Descriptors / Indicators Aching    Pain Type Chronic pain    Pain Onset More than a month ago                [facility] PT Assessment - 07/08/21 0855       Balance   Balance Assessed Yes      High Level Balance   High Level Balance Comments Pt attained full 30 seconds w/ conditions 1-3 on Modified CTSIB. Pt was only able to maitain conditition 4 for 15 seconds.      Functional Gait  Assessment   Gait assessed  Yes    Gait Level Surface Walks 20 ft in less than 7 sec but greater than 5.5 sec, uses assistive device, slower speed, mild gait  deviations, or deviates 6-10 in outside of the 12 in walkway width.    Change in Gait Speed Able to change speed, demonstrates mild gait deviations, deviates 6-10 in outside of the 12 in walkway width, or no gait deviations, unable to achieve a major change in velocity, or uses a change in velocity, or uses an assistive device.    Gait with Horizontal Head Turns Performs head turns smoothly with no change in gait. Deviates no more than 6 in outside 12 in walkway width    Gait with Vertical Head Turns Performs task with slight change in gait velocity (eg, minor disruption to smooth gait path), deviates 6 - 10 in outside 12 in walkway width or uses assistive device    Gait and Pivot Turn Pivot turns safely in greater than 3 sec and stops with no loss of balance, or pivot turns safely within 3 sec and stops with mild imbalance, requires small steps to catch balance.    Step Over Obstacle Is able to step over 2 stacked shoe boxes taped together (9 in total height) without changing gait speed. No evidence of imbalance.    Gait with Narrow Base of  Support Ambulates less than 4 steps heel to toe or cannot perform without assistance.    Gait with Eyes Closed Walks 20 ft, slow speed, abnormal gait pattern, evidence for imbalance, deviates 10-15 in outside 12 in walkway width. Requires more than 9 sec to ambulate 20 ft.    Ambulating Backwards Walks 20 ft, uses assistive device, slower speed, mild gait deviations, deviates 6-10 in outside 12 in walkway width.    Steps Alternating feet, must use rail.    Total Score 19                           [facility] Adult PT Treatment/Exercise - 07/08/21 0855       Transfers   Transfers Sit to Stand;Stand to Sit    Sit to Stand 7: Independent    Stand to Sit 7: Independent      Ambulation/Gait   Ambulation/Gait Yes    Ambulation/Gait Assistance 5: Supervision    Gait Pattern Step-through pattern;Decreased step length - left;Decreased step length - right    Ambulation Surface Level;Indoor      Balance   Balance Assessed Yes      Neuro Re-ed    Neuro Re-ed Details  In corner w/ chair in front of her pt performed x30 second of standing on pillows w/ EC for practicing HEP. Pt then performed x10 HT while standing on pillow in corner w/ chair in front vertical/horizontal each. Pt demonstrated increased sway w/ horizontal head turns need to touch the wall intermittently for balance. PT provided min guard and provided verbal cues for slow and controlled movements.      Exercises   Exercises Other Exercises    Other Exercises  At mat pt performed x10 sit to stands w/o UE assist. PT verbally cued pt to come all the way up and control the descent.                     PT Education - 07/08/21 1128     Education Details PT provided and educated pt on initial HEP focused on balance and BLE strengthening.    Person(s) Educated Patient    Methods Explanation;Handout    Comprehension Verbalized understanding;Returned demonstration  PT Short Term Goals - 06/29/21  1113       PT SHORT TERM GOAL #1   Title Pt will be independent w/ initial HEP focused on balance.    Time 4    Period Weeks    Status New    Target Date 07/31/21      PT SHORT TERM GOAL #2   Title Pt will be able to perform 5x sit <> stand in </= 10 seconds to demonstrate a decreased fall risk.    Baseline 06/29/21- 12.18 sec w/o hands from standard chair    Time 4    Period Weeks    Status New    Target Date 07/31/21      PT SHORT TERM GOAL #3   Title Pt will be able to ambulate at >/= 2.62 ft/sec for improved community ambulation.    Baseline 06/29/21- 2.51 ft/sec w/o AD    Time 4    Period Weeks    Status New    Target Date 07/31/21      PT SHORT TERM GOAL #4   Title Pt will be able to ambulate 500' over unlevel surfaces w/ LRAD vs no AD for improved functional mobility and community ambulation.    Time 4    Period Weeks    Status New    Target Date 07/31/21               PT Long Term Goals - 06/29/21 1122       PT LONG TERM GOAL #1   Title Pt will be independent w/ final balance focused HEP.    Time 8    Period Weeks    Status New    Target Date 08/28/21      PT LONG TERM GOAL #2   Title FGA Goal TBD    Time 8    Period Weeks    Status New    Target Date 08/28/21      PT LONG TERM GOAL #3   Title Modified CTSIB Goal TBD    Time 8    Period Weeks    Status New    Target Date 08/28/21      PT LONG TERM GOAL #4   Title Pt will be able to ambulate 1000' over varied surfaces w/ LRAD vs no AD for improved community ambulation.    Time 8    Period Weeks    Status New    Target Date 08/28/21                   Plan - 07/08/21 1129     Clinical Impression Statement Today's session focused on continued assessment of pt's balance and providing her w/ initial HEP focused on balance and BLE strengthening. Pt presents w/ deficits in balance while ambulating w/ EC, w/ HT, and while standing on complaint surfaces and narrow base of support. PT  will continue to progress pt as tolerated w/ activities to facilitate pt achievement of STG/LTG.    Personal Factors and Comorbidities Comorbidity 3+    Comorbidities Past medical history of neuropathy, hypertension, hyperlipidemia, anxiety, depression, colon cancer treated with surgery, OA, hypothyroid, history of alcohol abuse but in remission.    Examination-Activity Limitations Caring for Others;Locomotion Level;Stairs;Carry    Examination-Participation Restrictions Yard Work;Occupation;Community Activity;Cleaning    Stability/Clinical Decision Making Evolving/Moderate complexity    Rehab Potential Good    PT Frequency 2x / week   plus eval   PT Duration 8 weeks  PT Treatment/Interventions ADLs/Self Care Home Management;Aquatic Therapy;Moist Heat;Cryotherapy;Gait training;Functional mobility training;Therapeutic activities;Therapeutic exercise;Balance training;Manual techniques;Neuromuscular re-education;Passive range of motion;Vestibular;Patient/family education;Canalith Repostioning;Stair training;DME Instruction    PT Next Visit Plan How is HEP going?, Continue dynamic and static balance activities, BLE strengthening pre knee replacement    Consulted and Agree with Plan of Care Patient             Patient will benefit from skilled therapeutic intervention in order to improve the following deficits and impairments:  Decreased balance, Abnormal gait, Dizziness, Decreased strength, Difficulty walking, Hypomobility, Impaired sensation, Decreased range of motion, Decreased endurance, Pain  Visit Diagnosis: Unsteadiness on feet  Muscle weakness (generalized)  Difficulty in walking, not elsewhere classified     Problem List Patient Active Problem List   Diagnosis Date Noted   Centrilobular emphysema ([place]) 12/13/2020   Radiculopathy 11/24/2016   H/O colon cancer, stage I 01/19/2013   Nonspecific abnormal electrocardiogram (ECG) (EKG) 03/31/2011   Chest pain 03/31/2011    Hyperlipidemia 03/31/2011   Hypertension 03/31/2011   Tobacco abuse 03/31/2011    Lottie Mussel, Student-PT 07/08/2021, 11:32 AM  [facility] [address] [place], [place], [address] Phone: [phone]   Fax:  [phone]  Name: [name] MRN: [id number] Date of Birth: 01/11/1951

## 2021-07-08 NOTE — Patient Instructions (Signed)
Access Code: [email or web address] URL: [email or web address] Date: 07/08/2021 Prepared by: Cherly Anderson  Exercises Romberg Stance Eyes Closed on Foam Pad - 1 x daily - 7 x weekly - 3 sets - 30 hold Standing head turns - 1 x daily - 7 x weekly - 3 sets - 10 reps Romberg Stance with Head Nods on Foam Pad - 1 x daily - 7 x weekly - 3 sets - 10 reps Sit to Stand - 1 x daily - 7 x weekly - 3 sets - 10 reps

## 2021-07-09 ENCOUNTER — Ambulatory Visit: Payer: Medicare Other | Admitting: Pulmonary Disease

## 2021-07-10 ENCOUNTER — Ambulatory Visit: Payer: Medicare Other

## 2021-07-10 ENCOUNTER — Other Ambulatory Visit: Payer: Self-pay

## 2021-07-10 DIAGNOSIS — R2689 Other abnormalities of gait and mobility: Secondary | ICD-10-CM | POA: Diagnosis not present

## 2021-07-10 DIAGNOSIS — R2681 Unsteadiness on feet: Secondary | ICD-10-CM

## 2021-07-10 DIAGNOSIS — M6281 Muscle weakness (generalized): Secondary | ICD-10-CM | POA: Diagnosis not present

## 2021-07-10 DIAGNOSIS — R262 Difficulty in walking, not elsewhere classified: Secondary | ICD-10-CM | POA: Diagnosis not present

## 2021-07-10 DIAGNOSIS — R42 Dizziness and giddiness: Secondary | ICD-10-CM | POA: Diagnosis not present

## 2021-07-10 NOTE — Patient Instructions (Addendum)
Gaze Stabilization: Standing Feet Apart    Feet shoulder width apart, keeping eyes on target on wall 3-4 feet away, tilt head down 15-30 and move head side to side for 60 seconds. Repeat while moving head up and down for 30 seconds. Do 2-3 sessions per day. Copyright  VHI. All rights reserved.    Gaze Stabilization: Tip Card  1.Target must remain in focus, not blurry, and appear stationary while head is in motion. 2.Perform exercises with small head movements (45 to either side of midline). 3.Increase speed of head motion so long as target is in focus. 4.If you wear eyeglasses, be sure you can see target through lens (therapist will give specific instructions for bifocal / progressive lenses). 5.These exercises may provoke dizziness or nausea. Work through these symptoms. If too dizzy, slow head movement slightly. Rest between each exercise. 6.Exercises demand concentration; avoid distractions. 7.For safety, perform standing exercises close to a counter, wall, corner, or next to someone.  Copyright  VHI. All rights reserved.

## 2021-07-10 NOTE — Therapy (Signed)
[facility] [address] [place], [place], [address] Phone: [phone]   Fax:  [phone]  Physical Therapy Treatment  Patient Details  Name: [name] MRN: [id number] Date of Birth: Sep 03, 1950 Referring Provider (PT): Alric Ran, MD   Encounter Date: 07/10/2021   PT End of Session - 07/10/21 0800     Visit Number 3    Number of Visits 17    Date for PT Re-Evaluation 08/28/21    Authorization Type Medicare part A and part B/ BCBS    Progress Note Due on Visit 10    PT Start Time 0800    PT Stop Time 0841    PT Time Calculation (min) 41 min    Equipment Utilized During Treatment Gait belt    Activity Tolerance Patient tolerated treatment well    Behavior During Therapy WFL for tasks assessed/performed             Past Medical History:  Diagnosis Date   Alcohol abuse, in remission    Cancer of sigmoid colon ([place]) 07/2005   T2N0M0   Chronic bronchitis    Depression    Dyspnea    Gastritis    GERD (gastroesophageal reflux disease)    History of hiatal hernia    Hyperlipidemia    Hypertension    Hypothyroid    no meds   Migraines    OA (osteoarthritis)    PONV (postoperative nausea and vomiting)    Reflux     Past Surgical History:  Procedure Laterality Date   ABDOMINAL EXPOSURE N/A 11/24/2016   Procedure: ABDOMINAL EXPOSURE;  Surgeon: Rosetta Posner, MD;  Location: [facility];  Service: Vascular;  Laterality: N/A;   ABDOMINAL HYSTERECTOMY     ANTERIOR LUMBAR FUSION Bilateral 11/24/2016   Procedure: LUMBAR 5-SACRUM 1 ANTERIOR LUMBAR INTERBODY FUSION;  Surgeon: Phylliss Bob, MD;  Location: [facility];  Service: Orthopedics;  Laterality: Bilateral;  LUMBAR 5-SACRUM 1 ANTERIOR LUMBAR INTERBODY FUSION    APPENDECTOMY     CHOLECYSTECTOMY     COLON RESECTION     COLONOSCOPY     HAND SURGERY     repair of incisional hernia     SHOULDER SURGERY     both shoulders   TONSILLECTOMY      There were no  vitals filed for this visit.   Subjective Assessment - 07/10/21 0801     Subjective Reports feeling about the same. Unable to try exercicses due to unable to find corner. No falls.    Patient Stated Goals Improve balance to not fall    Currently in Pain? Yes    Pain Score 5     Pain Location Knee    Pain Orientation Right;Left    Pain Descriptors / Indicators Aching    Pain Type Chronic pain    Pain Onset More than a month ago                     Vestibular Assessment - 07/10/21 0001       Oculomotor Exam-Fixation Suppressed    Left Head Impulse Positive    Right Head Impulse Negative      Visual Acuity   Static 10    Dynamic 7             [facility] Adult PT Treatment/Exercise - 07/10/21 0001       Neuro Re-ed    Neuro Re-ed Details  Reviewed HEP provided at previous session.  Vestibular Treatment/Exercise - 07/10/21 0001       Vestibular Treatment/Exercise   Vestibular Treatment Provided Gaze    Gaze Exercises X1 Viewing Horizontal;X1 Viewing Vertical      X1 Viewing Horizontal   Foot Position standing feet apart    Reps 2    Comments x 30 seconds; x 60 seconds. 4/10 dizziness after completion.      X1 Viewing Vertical   Foot Position standing feet apart    Reps 2    Comments x 30 seconds, 3-4 dizziness              Balance Exercises - 07/10/21 0001       Balance Exercises: Standing   Standing Eyes Opened Narrow base of support (BOS);Head turns;Foam/compliant surface;Limitations    Standing Eyes Opened Limitations vertical/horizontal head turns x 10 reps each.    Standing Eyes Closed Wide (BOA);Foam/compliant surface;Head turns;Limitations;Narrow base of support (BOS)    Standing Eyes Closed Limitations standing with wide BOS EC with horiz/vertical head turns x 10 reps. CGA. mild dizziness with vertical head turns. narrow BOS on airex with EC, 3 x 30 seconds. reviewed proper set up for exercises at home as patient unable to  find corner.    Tandem Stance Eyes open;Limitations    Tandem Stance Time standing full tandem EO 2 x 30 secs each, intermittent touch A    SLS with Vectors Foam/compliant surface;Intermittent upper extremity assist;Limitations    SLS with Vectors Limitations standing on airex, compelted alternating toe taps to cones x 10 reps bilat forwards, intermittent CGA and touch A to // bars.    Stepping Strategy Anterior;Posterior;Foam/compliant surface;Limitations    Stepping Strategy Limitations standing on airex, compelted alternating anterior/posterior steps, cues for increased step length and clearance, completed x 10 reps bilat in both directions. CGA.    Tandem Gait Forward;Intermittent upper extremity support;Limitations;3 reps    Tandem Gait Limitations completed x 3 laps down and back in // bars.            Gaze Stabilization: Standing Feet Apart Feet shoulder width apart, keeping eyes on target on wall 3-4 feet away, tilt head down 15-30 and move head side to side for 60 seconds. Repeat while moving head up and down for 30 seconds. Do 2-3 sessions per day. Copyright  VHI. All rights reserved.    Gaze Stabilization: Tip Card  1.Target must remain in focus, not blurry, and appear stationary while head is in motion. 2.Perform exercises with small head movements (45 to either side of midline). 3.Increase speed of head motion so long as target is in focus. 4.If you wear eyeglasses, be sure you can see target through lens (therapist will give specific instructions for bifocal / progressive lenses). 5.These exercises may provoke dizziness or nausea. Work through these symptoms. If too dizzy, slow head movement slightly. Rest between each exercise. 6.Exercises demand concentration; avoid distractions. 7.For safety, perform standing exercises close to a counter, wall, corner, or next to someone.  Copyright  VHI. All rights reserved.     PT Education - 07/10/21 1018     Education  Details HEP Review; VOR x 1    Person(s) Educated Patient    Methods Demonstration;Explanation;Handout    Comprehension Returned demonstration;Verbalized understanding              PT Short Term Goals - 06/29/21 1113       PT SHORT TERM GOAL #1   Title Pt will be independent w/ initial HEP focused on  balance.    Time 4    Period Weeks    Status New    Target Date 07/31/21      PT SHORT TERM GOAL #2   Title Pt will be able to perform 5x sit <> stand in </= 10 seconds to demonstrate a decreased fall risk.    Baseline 06/29/21- 12.18 sec w/o hands from standard chair    Time 4    Period Weeks    Status New    Target Date 07/31/21      PT SHORT TERM GOAL #3   Title Pt will be able to ambulate at >/= 2.62 ft/sec for improved community ambulation.    Baseline 06/29/21- 2.51 ft/sec w/o AD    Time 4    Period Weeks    Status New    Target Date 07/31/21      PT SHORT TERM GOAL #4   Title Pt will be able to ambulate 500' over unlevel surfaces w/ LRAD vs no AD for improved functional mobility and community ambulation.    Time 4    Period Weeks    Status New    Target Date 07/31/21               PT Long Term Goals - 06/29/21 1122       PT LONG TERM GOAL #1   Title Pt will be independent w/ final balance focused HEP.    Time 8    Period Weeks    Status New    Target Date 08/28/21      PT LONG TERM GOAL #2   Title FGA Goal TBD    Time 8    Period Weeks    Status New    Target Date 08/28/21      PT LONG TERM GOAL #3   Title Modified CTSIB Goal TBD    Time 8    Period Weeks    Status New    Target Date 08/28/21      PT LONG TERM GOAL #4   Title Pt will be able to ambulate 1000' over varied surfaces w/ LRAD vs no AD for improved community ambulation.    Time 8    Period Weeks    Status New    Target Date 08/28/21                   Plan - 07/10/21 0981     Clinical Impression Statement Reviewed HEP with patient due to unable to find a  proper corner to complete, therefore educated on other options for compliance at home. patietn did 3 line difference on DVA and positive HIT on L side indicating impaired VOR. Initiated VOR x 1 with patient tolerating well. Rest of session focused on continued balanace activities, most challenge noted with SLS activities. Will continue per POC.    Personal Factors and Comorbidities Comorbidity 3+    Comorbidities Past medical history of neuropathy, hypertension, hyperlipidemia, anxiety, depression, colon cancer treated with surgery, OA, hypothyroid, history of alcohol abuse but in remission.    Examination-Activity Limitations Caring for Others;Locomotion Level;Stairs;Carry    Examination-Participation Restrictions Yard Work;Occupation;Community Activity;Cleaning    Stability/Clinical Decision Making Evolving/Moderate complexity    Rehab Potential Good    PT Frequency 2x / week   plus eval   PT Duration 8 weeks    PT Treatment/Interventions ADLs/Self Care Home Management;Aquatic Therapy;Moist Heat;Cryotherapy;Gait training;Functional mobility training;Therapeutic activities;Therapeutic exercise;Balance training;Manual techniques;Neuromuscular re-education;Passive range of motion;Vestibular;Patient/family education;Canalith Repostioning;Stair training;DME Instruction  PT Next Visit Plan How is HEP going? Review VOR. Continue dynamic and static balance activities, BLE strengthening pre knee replacement    Consulted and Agree with Plan of Care Patient             Patient will benefit from skilled therapeutic intervention in order to improve the following deficits and impairments:  Decreased balance, Abnormal gait, Dizziness, Decreased strength, Difficulty walking, Hypomobility, Impaired sensation, Decreased range of motion, Decreased endurance, Pain  Visit Diagnosis: Unsteadiness on feet  Muscle weakness (generalized)  Difficulty in walking, not elsewhere classified  Other abnormalities of  gait and mobility  Dizziness and giddiness     Problem List Patient Active Problem List   Diagnosis Date Noted   Centrilobular emphysema ([place]) 12/13/2020   Radiculopathy 11/24/2016   H/O colon cancer, stage I 01/19/2013   Nonspecific abnormal electrocardiogram (ECG) (EKG) 03/31/2011   Chest pain 03/31/2011   Hyperlipidemia 03/31/2011   Hypertension 03/31/2011   Tobacco abuse 03/31/2011    Jones Bales, PT, DPT 07/10/2021, 10:19 AM  [facility] [address] [place], [place], [address] Phone: [phone]   Fax:  [phone]  Name: [name] MRN: [id number] Date of Birth: June 24, 1951

## 2021-07-14 ENCOUNTER — Ambulatory Visit: Payer: Medicare Other

## 2021-07-14 ENCOUNTER — Other Ambulatory Visit: Payer: Self-pay

## 2021-07-14 DIAGNOSIS — R42 Dizziness and giddiness: Secondary | ICD-10-CM | POA: Diagnosis not present

## 2021-07-14 DIAGNOSIS — R2689 Other abnormalities of gait and mobility: Secondary | ICD-10-CM | POA: Diagnosis not present

## 2021-07-14 DIAGNOSIS — R262 Difficulty in walking, not elsewhere classified: Secondary | ICD-10-CM

## 2021-07-14 DIAGNOSIS — R2681 Unsteadiness on feet: Secondary | ICD-10-CM

## 2021-07-14 DIAGNOSIS — M6281 Muscle weakness (generalized): Secondary | ICD-10-CM

## 2021-07-14 NOTE — Therapy (Signed)
[facility]  [address] [place], [place], [address] Phone: [phone]   Fax:  [phone]  Physical Therapy Treatment  Patient Details  Name: [name] MRN: [id number] Date of Birth: February 03, 1951 Referring Provider (PT): Alric Ran, MD   Encounter Date: 07/14/2021   PT End of Session - 07/14/21 1717     Visit Number 4    Number of Visits 17    Date for PT Re-Evaluation 08/28/21    Authorization Type Medicare part A and part B/ BCBS    Progress Note Due on Visit 10    PT Start Time 1532    PT Stop Time 2725    PT Time Calculation (min) 42 min    Equipment Utilized During Treatment Gait belt    Activity Tolerance Patient tolerated treatment well    Behavior During Therapy WFL for tasks assessed/performed             Past Medical History:  Diagnosis Date   Alcohol abuse, in remission    Cancer of sigmoid colon ([place]) 07/2005   T2N0M0   Chronic bronchitis    Depression    Dyspnea    Gastritis    GERD (gastroesophageal reflux disease)    History of hiatal hernia    Hyperlipidemia    Hypertension    Hypothyroid    no meds   Migraines    OA (osteoarthritis)    PONV (postoperative nausea and vomiting)    Reflux     Past Surgical History:  Procedure Laterality Date   ABDOMINAL EXPOSURE N/A 11/24/2016   Procedure: ABDOMINAL EXPOSURE;  Surgeon: Rosetta Posner, MD;  Location: [facility];  Service: Vascular;  Laterality: N/A;   ABDOMINAL HYSTERECTOMY     ANTERIOR LUMBAR FUSION Bilateral 11/24/2016   Procedure: LUMBAR 5-SACRUM 1 ANTERIOR LUMBAR INTERBODY FUSION;  Surgeon: Phylliss Bob, MD;  Location: [facility];  Service: Orthopedics;  Laterality: Bilateral;  LUMBAR 5-SACRUM 1 ANTERIOR LUMBAR INTERBODY FUSION    APPENDECTOMY     CHOLECYSTECTOMY     COLON RESECTION     COLONOSCOPY     HAND SURGERY     repair of incisional hernia     SHOULDER SURGERY     both shoulders   TONSILLECTOMY      There were no  vitals filed for this visit.   Subjective Assessment - 07/14/21 1533     Subjective Pt reports that her legs have been feeling wobbly and that she catches herself stumbling more the past two days possibly due to overworking them.    Patient Stated Goals Improve balance to not fall    Currently in Pain? Yes    Pain Score 6     Pain Location Knee    Pain Orientation Right;Left    Pain Type Chronic pain    Pain Onset More than a month ago                               [facility] Adult PT Treatment/Exercise - 07/14/21 1545       Transfers   Transfers Sit to Stand;Stand to Sit    Sit to Stand 7: Independent    Stand to Sit 7: Independent      Ambulation/Gait   Ambulation/Gait Yes    Ambulation/Gait Assistance 5: Supervision    Gait Pattern Step-through pattern;Decreased step length - left;Decreased step length - right    Ambulation Surface Level;Indoor  Neuro Re-ed    Neuro Re-ed Details  In // pt performed: reciprocal gait over hurdles x3 laps. Pt had increased difficulty when supporting herself on [name]. Pt required intermittent UE assist to maintain balance. PT provided min guard and verbal cues for technique. 3 laps of side steps over hurdles w/ UE assist. PT verbally cued pt to maintain forward hips and to get closer to hurdles to allow space for both feet to clear hurdle. Pt performed 3x10 cone taps on airex BLE each. Pt demonstrated increased difficulty w/ standing on [name]. PT did not require UE assist but needed PT min guard for safety. PT verbally cued pt for slow controlled movements. Pt performed 2x30 EO/EC on airex each. Pt demonstrated increased sway w/ EC but did not require UE assist. PT provided min guard.             Vestibular Treatment/Exercise - 07/14/21 1536       Vestibular Treatment/Exercise   Vestibular Treatment Provided Gaze    Gaze Exercises X1 Viewing Horizontal;X1 Viewing Vertical      X1 Viewing Horizontal   Foot Position standing  feet together    Reps 2    Comments x 60 seconds, no dizziness just feels like her balance is wobbly      X1 Viewing Vertical   Foot Position standing feet together    Reps 2    Comments x45 seconds x60 seconds, no dizziness just feels like her balance is wobbly                      PT Short Term Goals - 06/29/21 1113       PT SHORT TERM GOAL #1   Title Pt will be independent w/ initial HEP focused on balance.    Time 4    Period Weeks    Status New    Target Date 07/31/21      PT SHORT TERM GOAL #2   Title Pt will be able to perform 5x sit <> stand in </= 10 seconds to demonstrate a decreased fall risk.    Baseline 06/29/21- 12.18 sec w/o hands from standard chair    Time 4    Period Weeks    Status New    Target Date 07/31/21      PT SHORT TERM GOAL #3   Title Pt will be able to ambulate at >/= 2.62 ft/sec for improved community ambulation.    Baseline 06/29/21- 2.51 ft/sec w/o AD    Time 4    Period Weeks    Status New    Target Date 07/31/21      PT SHORT TERM GOAL #4   Title Pt will be able to ambulate 500' over unlevel surfaces w/ LRAD vs no AD for improved functional mobility and community ambulation.    Time 4    Period Weeks    Status New    Target Date 07/31/21               PT Long Term Goals - 07/14/21 1719       PT LONG TERM GOAL #1   Title Pt will be independent w/ final balance focused HEP.    Time 8    Period Weeks    Status New    Target Date 08/28/21      PT LONG TERM GOAL #2   Title Pt will improve score to >/= 25/30 on FGA demonstrating a decreased fall risk.  Baseline 07/08/21-19/30    Time 8    Period Weeks    Status New    Target Date 08/28/21      PT LONG TERM GOAL #3   Title Pt will be able to go full 30 secons on condition 4 on Modified CTSIB for improved balance.    Baseline 07/08/21/- Full 30 seconds conditions 1-3, 15 seconds on condition 4    Time 8    Period Weeks    Status New    Target Date  08/28/21      PT LONG TERM GOAL #4   Title Pt will be able to ambulate 1000' over varied surfaces w/ LRAD vs no AD for improved community ambulation.    Time 8    Period Weeks    Status New    Target Date 08/28/21                   Plan - 07/14/21 1721     Clinical Impression Statement Today's session focused on continued VOR and high level balance training. Pt still presents w/ deficits in balance on complaint surfaces w/ EC and when having to support herself on single lower extremtiy ([name]>RLE). PT will continue to progress pt as tolerated w/ POC towards pt achievment of STG/LTG.    Personal Factors and Comorbidities Comorbidity 3+    Comorbidities Past medical history of neuropathy, hypertension, hyperlipidemia, anxiety, depression, colon cancer treated with surgery, OA, hypothyroid, history of alcohol abuse but in remission.    Examination-Activity Limitations Caring for Others;Locomotion Level;Stairs;Carry    Examination-Participation Restrictions Yard Work;Occupation;Community Activity;Cleaning    Stability/Clinical Decision Making Evolving/Moderate complexity    Rehab Potential Good    PT Frequency 2x / week   plus eval   PT Duration 8 weeks    PT Treatment/Interventions ADLs/Self Care Home Management;Aquatic Therapy;Moist Heat;Cryotherapy;Gait training;Functional mobility training;Therapeutic activities;Therapeutic exercise;Balance training;Manual techniques;Neuromuscular re-education;Passive range of motion;Vestibular;Patient/family education;Canalith Repostioning;Stair training;DME Instruction    PT Next Visit Plan How is HEP going? Review VOR. Continue dynamic and static balance activities, BLE strengthening pre knee replacement    Consulted and Agree with Plan of Care Patient             Patient will benefit from skilled therapeutic intervention in order to improve the following deficits and impairments:  Decreased balance, Abnormal gait, Dizziness, Decreased  strength, Difficulty walking, Hypomobility, Impaired sensation, Decreased range of motion, Decreased endurance, Pain  Visit Diagnosis: Unsteadiness on feet  Muscle weakness (generalized)  Difficulty in walking, not elsewhere classified     Problem List Patient Active Problem List   Diagnosis Date Noted   Centrilobular emphysema ([place]) 12/13/2020   Radiculopathy 11/24/2016   H/O colon cancer, stage I 01/19/2013   Nonspecific abnormal electrocardiogram (ECG) (EKG) 03/31/2011   Chest pain 03/31/2011   Hyperlipidemia 03/31/2011   Hypertension 03/31/2011   Tobacco abuse 03/31/2011    Lottie Mussel, Student-PT 07/14/2021, 5:23 PM  [facility] [address] [place], [place], [address] Phone: [phone]   Fax:  [phone]  Name: [name] MRN: [id number] Date of Birth: 08/10/1950

## 2021-07-17 ENCOUNTER — Other Ambulatory Visit: Payer: Self-pay

## 2021-07-17 ENCOUNTER — Ambulatory Visit: Payer: Medicare Other

## 2021-07-17 DIAGNOSIS — M6281 Muscle weakness (generalized): Secondary | ICD-10-CM | POA: Diagnosis not present

## 2021-07-17 DIAGNOSIS — R262 Difficulty in walking, not elsewhere classified: Secondary | ICD-10-CM | POA: Diagnosis not present

## 2021-07-17 DIAGNOSIS — R2681 Unsteadiness on feet: Secondary | ICD-10-CM

## 2021-07-17 DIAGNOSIS — R2689 Other abnormalities of gait and mobility: Secondary | ICD-10-CM | POA: Diagnosis not present

## 2021-07-17 DIAGNOSIS — R42 Dizziness and giddiness: Secondary | ICD-10-CM | POA: Diagnosis not present

## 2021-07-17 NOTE — Telephone Encounter (Signed)
Patient's OV notes and clearance form have been faxed back to [employer]. Nothing further needed at this time.

## 2021-07-17 NOTE — Therapy (Signed)
[facility] [address] [place], [place], [address] Phone: [phone]   Fax:  [phone]  Physical Therapy Treatment  Patient Details  Name: [name] MRN: [id number] Date of Birth: 05/22/1951 Referring Provider (PT): Alric Ran, MD   Encounter Date: 07/17/2021   PT End of Session - 07/17/21 1454     Visit Number 5    Number of Visits 17    Date for PT Re-Evaluation 08/28/21    Authorization Type Medicare part A and part B/ BCBS    Progress Note Due on Visit 10    PT Start Time 1403    PT Stop Time 0623    PT Time Calculation (min) 42 min    Equipment Utilized During Treatment Gait belt    Activity Tolerance Patient tolerated treatment well    Behavior During Therapy WFL for tasks assessed/performed             Past Medical History:  Diagnosis Date   Alcohol abuse, in remission    Cancer of sigmoid colon ([place]) 07/2005   T2N0M0   Chronic bronchitis    Depression    Dyspnea    Gastritis    GERD (gastroesophageal reflux disease)    History of hiatal hernia    Hyperlipidemia    Hypertension    Hypothyroid    no meds   Migraines    OA (osteoarthritis)    PONV (postoperative nausea and vomiting)    Reflux     Past Surgical History:  Procedure Laterality Date   ABDOMINAL EXPOSURE N/A 11/24/2016   Procedure: ABDOMINAL EXPOSURE;  Surgeon: Rosetta Posner, MD;  Location: [facility];  Service: Vascular;  Laterality: N/A;   ABDOMINAL HYSTERECTOMY     ANTERIOR LUMBAR FUSION Bilateral 11/24/2016   Procedure: LUMBAR 5-SACRUM 1 ANTERIOR LUMBAR INTERBODY FUSION;  Surgeon: Phylliss Bob, MD;  Location: [facility];  Service: Orthopedics;  Laterality: Bilateral;  LUMBAR 5-SACRUM 1 ANTERIOR LUMBAR INTERBODY FUSION    APPENDECTOMY     CHOLECYSTECTOMY     COLON RESECTION     COLONOSCOPY     HAND SURGERY     repair of incisional hernia     SHOULDER SURGERY     both shoulders   TONSILLECTOMY      There were no  vitals filed for this visit.   Subjective Assessment - 07/17/21 1405     Subjective Pt reports her legs have continued to feel wobbly the last two days. Pt reports she has been practicing her head turns even though she does not have an open corner.    Patient Stated Goals Improve balance to not fall    Currently in Pain? Yes    Pain Score 7     Pain Location Knee    Pain Orientation Right;Left    Pain Descriptors / Indicators Aching    Pain Type Chronic pain    Pain Onset More than a month ago                               [facility] Adult PT Treatment/Exercise - 07/17/21 1409       Transfers   Transfers Sit to Stand;Stand to Sit    Sit to Stand 7: Independent    Stand to Sit 7: Independent      Ambulation/Gait   Ambulation/Gait Yes    Ambulation/Gait Assistance 5: Supervision    Gait Pattern Step-through pattern;Decreased  step length - left;Decreased step length - right    Ambulation Surface Level;Indoor      Neuro Re-ed    Neuro Re-ed Details  In // pt peformed: 2x3 laps of side steps on blue foam balance beam. Pt required intermittent UE assist to maintain balance. PT provided verbal cues for technique and min guard. 4inch step ups w/ airex on top 2x10 BLE each. Pt did not require UE support to perform exercise. Pt demonstrated incresaed difficutly w/ stepping up w/ [name] extremity. PT provided min guard and verbal cues for technique. On ramp pt performed: 2x30 seconds rhomberg EO/EC each. Pt demonstrated increased sway with EC. PT provided min guard. 2x10 of stationary marches BLE each. Pt demonstrated occasional stumbles but was able to self-correct. PT provided verbal cues for technique and upright posture. PT provided min guard.             Vestibular Treatment/Exercise - 07/17/21 0001       Vestibular Treatment/Exercise   Vestibular Treatment Provided Gaze    Gaze Exercises X1 Viewing Horizontal;X1 Viewing Vertical      X1 Viewing Horizontal   Foot  Position standing feet apart and standing feet together on airex    Reps 2    Comments x60 seconds, no dizziness      X1 Viewing Vertical   Foot Position standing feet apart and standing feet together on airex    Reps 2    Comments x60 seconds, increased sway compared to horizontal                      PT Short Term Goals - 06/29/21 1113       PT SHORT TERM GOAL #1   Title Pt will be independent w/ initial HEP focused on balance.    Time 4    Period Weeks    Status New    Target Date 07/31/21      PT SHORT TERM GOAL #2   Title Pt will be able to perform 5x sit <> stand in </= 10 seconds to demonstrate a decreased fall risk.    Baseline 06/29/21- 12.18 sec w/o hands from standard chair    Time 4    Period Weeks    Status New    Target Date 07/31/21      PT SHORT TERM GOAL #3   Title Pt will be able to ambulate at >/= 2.62 ft/sec for improved community ambulation.    Baseline 06/29/21- 2.51 ft/sec w/o AD    Time 4    Period Weeks    Status New    Target Date 07/31/21      PT SHORT TERM GOAL #4   Title Pt will be able to ambulate 500' over unlevel surfaces w/ LRAD vs no AD for improved functional mobility and community ambulation.    Time 4    Period Weeks    Status New    Target Date 07/31/21               PT Long Term Goals - 07/14/21 1719       PT LONG TERM GOAL #1   Title Pt will be independent w/ final balance focused HEP.    Time 8    Period Weeks    Status New    Target Date 08/28/21      PT LONG TERM GOAL #2   Title Pt will improve score to >/= 25/30 on FGA demonstrating a decreased fall  risk.    Baseline 07/08/21-19/30    Time 8    Period Weeks    Status New    Target Date 08/28/21      PT LONG TERM GOAL #3   Title Pt will be able to go full 30 seconds on condition 4 on Modified CTSIB for improved balance.    Baseline 07/08/21/- Full 30 seconds conditions 1-3, 15 seconds on condition 4    Time 8    Period Weeks    Status New     Target Date 08/28/21      PT LONG TERM GOAL #4   Title Pt will be able to ambulate 1000' over varied surfaces w/ LRAD vs no AD for improved community ambulation.    Time 8    Period Weeks    Status New    Target Date 08/28/21                   Plan - 07/17/21 1455     Clinical Impression Statement Today's PT session focused on continued progression on VOR and dynamic balance activities. Pt demonstrates progress in static balance but continues to demonstrate deficits in dynamic balance activities on ueven surfaces. PT will continue to progress pt w/ POC as tolerated towards pt achievement of STG/LTG.    Personal Factors and Comorbidities Comorbidity 3+    Comorbidities Past medical history of neuropathy, hypertension, hyperlipidemia, anxiety, depression, colon cancer treated with surgery, OA, hypothyroid, history of alcohol abuse but in remission.    Examination-Activity Limitations Caring for Others;Locomotion Level;Stairs;Carry    Examination-Participation Restrictions Yard Work;Occupation;Community Activity;Cleaning    Stability/Clinical Decision Making Evolving/Moderate complexity    Rehab Potential Good    PT Frequency 2x / week   plus eval   PT Duration 8 weeks    PT Treatment/Interventions ADLs/Self Care Home Management;Aquatic Therapy;Moist Heat;Cryotherapy;Gait training;Functional mobility training;Therapeutic activities;Therapeutic exercise;Balance training;Manual techniques;Neuromuscular re-education;Passive range of motion;Vestibular;Patient/family education;Canalith Repostioning;Stair training;DME Instruction    PT Next Visit Plan How is HEP going? Review VOR. Continue dynamic and static balance activities, BLE strengthening pre knee replacement    Consulted and Agree with Plan of Care Patient             Patient will benefit from skilled therapeutic intervention in order to improve the following deficits and impairments:  Decreased balance, Abnormal gait,  Dizziness, Decreased strength, Difficulty walking, Hypomobility, Impaired sensation, Decreased range of motion, Decreased endurance, Pain  Visit Diagnosis: Difficulty in walking, not elsewhere classified  Muscle weakness (generalized)  Unsteadiness on feet     Problem List Patient Active Problem List   Diagnosis Date Noted   Centrilobular emphysema ([place]) 12/13/2020   Radiculopathy 11/24/2016   H/O colon cancer, stage I 01/19/2013   Nonspecific abnormal electrocardiogram (ECG) (EKG) 03/31/2011   Chest pain 03/31/2011   Hyperlipidemia 03/31/2011   Hypertension 03/31/2011   Tobacco abuse 03/31/2011    Lottie Mussel, Student-PT 07/17/2021, 2:57 PM  [facility] [address] [place], [place], [address] Phone: [phone]   Fax:  [phone]  Name: [name] MRN: [id number] Date of Birth: [birth year]

## 2021-07-21 ENCOUNTER — Ambulatory Visit: Payer: Medicare Other

## 2021-07-21 ENCOUNTER — Other Ambulatory Visit: Payer: Self-pay

## 2021-07-21 DIAGNOSIS — R2689 Other abnormalities of gait and mobility: Secondary | ICD-10-CM | POA: Diagnosis not present

## 2021-07-21 DIAGNOSIS — R42 Dizziness and giddiness: Secondary | ICD-10-CM

## 2021-07-21 DIAGNOSIS — R2681 Unsteadiness on feet: Secondary | ICD-10-CM

## 2021-07-21 DIAGNOSIS — M6281 Muscle weakness (generalized): Secondary | ICD-10-CM | POA: Diagnosis not present

## 2021-07-21 DIAGNOSIS — R262 Difficulty in walking, not elsewhere classified: Secondary | ICD-10-CM | POA: Diagnosis not present

## 2021-07-21 NOTE — Patient Instructions (Signed)
Access Code: [email or web address] URL: [email or web address] Date: 07/21/2021 Prepared by: Cherly Anderson  Exercises Romberg Stance Eyes Closed on Foam Pad - 1 x daily - 7 x weekly - 3 sets - 30 hold Standing head turns - 1 x daily - 7 x weekly - 3 sets - 10 reps Romberg Stance with Head Nods on Foam Pad - 1 x daily - 7 x weekly - 3 sets - 10 reps Sit to Stand - 1 x daily - 7 x weekly - 3 sets - 10 reps Seated Nose to Left Knee Vestibular Habituation - 1 x daily - 7 x weekly - 3 sets - 5 reps

## 2021-07-22 DIAGNOSIS — M1712 Unilateral primary osteoarthritis, left knee: Secondary | ICD-10-CM | POA: Diagnosis not present

## 2021-07-22 NOTE — Therapy (Signed)
[facility] [address] [place], [place], [address] Phone: [phone]   Fax:  [phone]  Physical Therapy Treatment  Patient Details  Name: [name] MRN: [id number] Date of Birth: 03-06-1951 Referring Provider (PT): Alric Ran, MD   Encounter Date: 07/21/2021   PT End of Session - 07/21/21 1534     Visit Number 6    Number of Visits 17    Date for PT Re-Evaluation 08/28/21    Authorization Type Medicare part A and part B/ BCBS    Progress Note Due on Visit 10    PT Start Time 1534    PT Stop Time 1616    PT Time Calculation (min) 42 min    Equipment Utilized During Treatment Gait belt    Activity Tolerance Patient tolerated treatment well    Behavior During Therapy WFL for tasks assessed/performed             Past Medical History:  Diagnosis Date   Alcohol abuse, in remission    Cancer of sigmoid colon ([place]) 07/2005   T2N0M0   Chronic bronchitis    Depression    Dyspnea    Gastritis    GERD (gastroesophageal reflux disease)    History of hiatal hernia    Hyperlipidemia    Hypertension    Hypothyroid    no meds   Migraines    OA (osteoarthritis)    PONV (postoperative nausea and vomiting)    Reflux     Past Surgical History:  Procedure Laterality Date   ABDOMINAL EXPOSURE N/A 11/24/2016   Procedure: ABDOMINAL EXPOSURE;  Surgeon: Rosetta Posner, MD;  Location: [facility];  Service: Vascular;  Laterality: N/A;   ABDOMINAL HYSTERECTOMY     ANTERIOR LUMBAR FUSION Bilateral 11/24/2016   Procedure: LUMBAR 5-SACRUM 1 ANTERIOR LUMBAR INTERBODY FUSION;  Surgeon: Phylliss Bob, MD;  Location: [facility];  Service: Orthopedics;  Laterality: Bilateral;  LUMBAR 5-SACRUM 1 ANTERIOR LUMBAR INTERBODY FUSION    APPENDECTOMY     CHOLECYSTECTOMY     COLON RESECTION     COLONOSCOPY     HAND SURGERY     repair of incisional hernia     SHOULDER SURGERY     both shoulders   TONSILLECTOMY      There were no  vitals filed for this visit.   Subjective Assessment - 07/21/21 1535     Subjective Pt reports that she has been going up and down steps more as has to help one of the animals up/down 4x/day. Denies any falls but did have a bout of feeling dizzy when leaning forward to wash her hair with eyes closed. Does not happen everytime. Describes mostly as unsteady but did have some room spinning.    Patient Stated Goals Improve balance to not fall    Currently in Pain? Yes    Pain Score 5     Pain Location Knee    Pain Orientation Right;Left    Pain Descriptors / Indicators Aching    Pain Type Chronic pain    Pain Onset More than a month ago                               [facility] Adult PT Treatment/Exercise - 07/21/21 1538       Ambulation/Gait   Ambulation/Gait Yes    Ambulation/Gait Assistance 5: Supervision    Ambulation/Gait Assistance Details around in clinic between activities  Assistive device None    Gait Pattern Step-to pattern;Antalgic    Ambulation Surface Level;Indoor      Neuro Re-ed    Neuro Re-ed Details  In // bars: standing on blue mat staggered stance 30 sec x 2, feet together eyes closed x 30 sec then head turns with eyes closed x 30 sec, staggered stance with head turns and eyes closed x 10 each position CGA. Sidestepping over blue mat without UE support 6' x 4 then marching over blue mat 6' x 4 with occasional UE support.             Vestibular Treatment/Exercise - 07/21/21 1549       Vestibular Treatment/Exercise   Vestibular Treatment Provided Gaze;Habituation    Habituation Exercises Seated Diagonal Head Turns    Gaze Exercises X1 Viewing Horizontal;X1 Viewing Vertical      Seated Diagonal Head Turns   Symptoms Description  Performed going up/down with head towards knee first eyes open and then eyes closed as no issues with eyes open. Reported feeling a little wobbly with eyes closed but no room spinning. Had her perform x 5 to each leg  with eyes closed. Standing had pt bend over to pick up 4 cones. Cued to come up slowly. No dizziness reported just felt a little wobbly.      X1 Viewing Horizontal   Foot Position standing feet apart then standing feet together    Reps 2   each position   Comments 60 sec x 2 feet apart then 30 sec x 2 feet together. Initially 4/10 dizziness then 2/10 after first bout. Needed tactile cues to prevent turning shoulders and verbal cues to decrease range and go a little faster.      X1 Viewing Vertical   Foot Position standing feet apart    Reps 2    Comments 60 sec                    PT Education - 07/22/21 0807     Education Details Added habituation exercises with eyes closed to HEP    Person(s) Educated Patient    Methods Explanation;Demonstration;Handout    Comprehension Verbalized understanding;Returned demonstration              PT Short Term Goals - 06/29/21 1113       PT SHORT TERM GOAL #1   Title Pt will be independent w/ initial HEP focused on balance.    Time 4    Period Weeks    Status New    Target Date 07/31/21      PT SHORT TERM GOAL #2   Title Pt will be able to perform 5x sit <> stand in </= 10 seconds to demonstrate a decreased fall risk.    Baseline 06/29/21- 12.18 sec w/o hands from standard chair    Time 4    Period Weeks    Status New    Target Date 07/31/21      PT SHORT TERM GOAL #3   Title Pt will be able to ambulate at >/= 2.62 ft/sec for improved community ambulation.    Baseline 06/29/21- 2.51 ft/sec w/o AD    Time 4    Period Weeks    Status New    Target Date 07/31/21      PT SHORT TERM GOAL #4   Title Pt will be able to ambulate 500' over unlevel surfaces w/ LRAD vs no AD for improved functional mobility and community ambulation.  Time 4    Period Weeks    Status New    Target Date 07/31/21               PT Long Term Goals - 07/14/21 1719       PT LONG TERM GOAL #1   Title Pt will be independent w/ final  balance focused HEP.    Time 8    Period Weeks    Status New    Target Date 08/28/21      PT LONG TERM GOAL #2   Title Pt will improve score to >/= 25/30 on FGA demonstrating a decreased fall risk.    Baseline 07/08/21-19/30    Time 8    Period Weeks    Status New    Target Date 08/28/21      PT LONG TERM GOAL #3   Title Pt will be able to go full 30 seconds on condition 4 on Modified CTSIB for improved balance.    Baseline 07/08/21/- Full 30 seconds conditions 1-3, 15 seconds on condition 4    Time 8    Period Weeks    Status New    Target Date 08/28/21      PT LONG TERM GOAL #4   Title Pt will be able to ambulate 1000' over varied surfaces w/ LRAD vs no AD for improved community ambulation.    Time 8    Period Weeks    Status New    Target Date 08/28/21                   Plan - 07/22/21 0808     Clinical Impression Statement Pt had decrease in symptoms as went on with horizontal VOR gaze stabilization exercise. Started on seated habituation exercise as pt reporting getting dizzy in shower when looking down with eyes closed.    Personal Factors and Comorbidities Comorbidity 3+    Comorbidities Past medical history of neuropathy, hypertension, hyperlipidemia, anxiety, depression, colon cancer treated with surgery, OA, hypothyroid, history of alcohol abuse but in remission.    Examination-Activity Limitations Caring for Others;Locomotion Level;Stairs;Carry    Examination-Participation Restrictions Yard Work;Occupation;Community Activity;Cleaning    Stability/Clinical Decision Making Evolving/Moderate complexity    Rehab Potential Good    PT Frequency 2x / week   plus eval   PT Duration 8 weeks    PT Treatment/Interventions ADLs/Self Care Home Management;Aquatic Therapy;Moist Heat;Cryotherapy;Gait training;Functional mobility training;Therapeutic activities;Therapeutic exercise;Balance training;Manual techniques;Neuromuscular re-education;Passive range of  motion;Vestibular;Patient/family education;Canalith Repostioning;Stair training;DME Instruction    PT Next Visit Plan Check STGs. How is habituation exercise going? Review VOR. Continue dynamic and static balance activities, BLE strengthening pre knee replacement    Consulted and Agree with Plan of Care Patient             Patient will benefit from skilled therapeutic intervention in order to improve the following deficits and impairments:  Decreased balance, Abnormal gait, Dizziness, Decreased strength, Difficulty walking, Hypomobility, Impaired sensation, Decreased range of motion, Decreased endurance, Pain  Visit Diagnosis: Other abnormalities of gait and mobility  Unsteadiness on feet  Dizziness and giddiness     Problem List Patient Active Problem List   Diagnosis Date Noted   Centrilobular emphysema ([place]) 12/13/2020   Radiculopathy 11/24/2016   H/O colon cancer, stage I 01/19/2013   Nonspecific abnormal electrocardiogram (ECG) (EKG) 03/31/2011   Chest pain 03/31/2011   Hyperlipidemia 03/31/2011   Hypertension 03/31/2011   Tobacco abuse 03/31/2011    Electa Sniff, PT, DPT,  NCS 07/22/2021, 8:11 AM  [facility] [address] [place], [place], [address] Phone: [phone]   Fax:  [phone]  Name: [name] MRN: [id number] Date of Birth: [birth year]

## 2021-07-24 ENCOUNTER — Ambulatory Visit: Payer: Medicare Other

## 2021-07-24 ENCOUNTER — Other Ambulatory Visit: Payer: Self-pay

## 2021-07-24 DIAGNOSIS — R2681 Unsteadiness on feet: Secondary | ICD-10-CM

## 2021-07-24 DIAGNOSIS — R2689 Other abnormalities of gait and mobility: Secondary | ICD-10-CM | POA: Diagnosis not present

## 2021-07-24 DIAGNOSIS — M6281 Muscle weakness (generalized): Secondary | ICD-10-CM

## 2021-07-24 DIAGNOSIS — R42 Dizziness and giddiness: Secondary | ICD-10-CM

## 2021-07-24 DIAGNOSIS — R262 Difficulty in walking, not elsewhere classified: Secondary | ICD-10-CM

## 2021-07-24 NOTE — Therapy (Signed)
[facility] [address] [place], [place], [address] Phone: [phone]   Fax:  [phone]  Physical Therapy Treatment  Patient Details  Name: [name] MRN: [id number] Date of Birth: [birth year] Referring Provider (PT): Alric Ran, MD   Encounter Date: 07/24/2021   PT End of Session - 07/24/21 1150     Visit Number 7    Number of Visits 17    Date for PT Re-Evaluation 08/28/21    Authorization Type Medicare part A and part B/ BCBS    Progress Note Due on Visit 10    PT Start Time 1146    PT Stop Time 1230    PT Time Calculation (min) 44 min    Equipment Utilized During Treatment Gait belt    Activity Tolerance Patient tolerated treatment well    Behavior During Therapy WFL for tasks assessed/performed             Past Medical History:  Diagnosis Date   Alcohol abuse, in remission    Cancer of sigmoid colon ([place]) 07/2005   T2N0M0   Chronic bronchitis    Depression    Dyspnea    Gastritis    GERD (gastroesophageal reflux disease)    History of hiatal hernia    Hyperlipidemia    Hypertension    Hypothyroid    no meds   Migraines    OA (osteoarthritis)    PONV (postoperative nausea and vomiting)    Reflux     Past Surgical History:  Procedure Laterality Date   ABDOMINAL EXPOSURE N/A 11/24/2016   Procedure: ABDOMINAL EXPOSURE;  Surgeon: Rosetta Posner, MD;  Location: [facility];  Service: Vascular;  Laterality: N/A;   ABDOMINAL HYSTERECTOMY     ANTERIOR LUMBAR FUSION Bilateral 11/24/2016   Procedure: LUMBAR 5-SACRUM 1 ANTERIOR LUMBAR INTERBODY FUSION;  Surgeon: Phylliss Bob, MD;  Location: [facility];  Service: Orthopedics;  Laterality: Bilateral;  LUMBAR 5-SACRUM 1 ANTERIOR LUMBAR INTERBODY FUSION    APPENDECTOMY     CHOLECYSTECTOMY     COLON RESECTION     COLONOSCOPY     HAND SURGERY     repair of incisional hernia     SHOULDER SURGERY     both shoulders   TONSILLECTOMY      There were no  vitals filed for this visit.   Subjective Assessment - 07/24/21 1150     Subjective Patient reports that she has been having some pain in the knee. Reports no significant dizziness since last session.    Patient Stated Goals Improve balance to not fall    Currently in Pain? Yes    Pain Score 4     Pain Location Knee    Pain Orientation Right    Pain Descriptors / Indicators Aching    Pain Type Chronic pain    Pain Onset More than a month ago              [facility] Adult PT Treatment/Exercise - 07/24/21 0001       Transfers   Transfers Sit to Stand;Stand to Sit    Sit to Stand 7: Independent    Five time sit to stand comments  11.16 secs w/o hand from standard chair    Stand to Sit 7: Independent    Number of Reps 2 sets;Other reps (comment)   5   Comments sit <> stands with staggered stance to promote strentheningof BLE, alternating foot position.      Ambulation/Gait  Ambulation/Gait Yes    Ambulation/Gait Assistance 5: Supervision    Ambulation/Gait Assistance Details completed ambulation indoors on unlevel surfaces and level surfaces, along with obstacle negoitation to furtheer balance x 500 ft. mild unsteadiness noted with stepping over obstalce and unlevel but supervision. mild discomfort in knees with long distance ambulation    Ambulation Distance (Feet) 500 Feet    Assistive device None    Gait Pattern Step-to pattern;Antalgic    Ambulation Surface Level;Indoor    Gait velocity 9.81 secs = 3.34 ft/sec      Neuro Re-ed    Neuro Re-ed Details  Reviewed entire HEP; see medbridge program for details             Vestibular Treatment/Exercise - 07/24/21 0001       Vestibular Treatment/Exercise   Vestibular Treatment Provided Gaze    Gaze Exercises X1 Viewing Horizontal;X1 Viewing Vertical      X1 Viewing Horizontal   Foot Position standing feet together    Reps 1    Comments x 60 seconds; mirror background      X1 Viewing Vertical   Foot Position standing  feet together    Reps 1    Comments x 60 seconds; mirror background             Gaze Stabilization: Standing Feet Together    Feet together, keeping eyes on target on mirror 3-4 feet away, tilt head down 15-30 and move head side to side for 60 seconds. Repeat while moving head up and down for 60 seconds. Do 2-3 sessions per day. Copyright  VHI. All rights reserved.    Reviewed and completed all of the following exercises during session.  Updated HEP based on patient's progress, bolded are progressions completed in today's session Access Code: [email or web address] URL: [email or web address] Date: 07/24/2021 Prepared by: Baldomero Lamy  Exercises Seated Nose to Left Knee Vestibular Habituation - 1 x daily - 7 x weekly - 3 sets - 5 reps Staggered Sit-to-Stand - 1 x daily - 7 x weekly - 4 sets - 5 reps Romberg Stance Eyes Closed on Foam Pad - 1 x daily - 7 x weekly - 3 sets - 30 hold Standing Feet Apart with Eyes Closed and Head Nods on Foam Pad - 1 x daily - 7 x weekly - 2 sets - 10 reps Standing Feet Apart with Eyes Closed and Head Rotation on Foam Pad - 1 x daily - 7 x weekly - 2 sets - 10 reps       PT Education - 07/24/21 1152     Education Details progress toward STGs; HEP update    Person(s) Educated Patient    Methods Explanation;Demonstration;Handout    Comprehension Verbalized understanding;Returned demonstration              PT Short Term Goals - 07/24/21 1156       PT SHORT TERM GOAL #1   Title Pt will be independent w/ initial HEP focused on balance.    Baseline reports independence with updated HEP    Time 4    Period Weeks    Status Achieved    Target Date 07/31/21      PT SHORT TERM GOAL #2   Title Pt will be able to perform 5x sit <> stand in </= 10 seconds to demonstrate a decreased fall risk.    Baseline 06/29/21- 12.18 sec w/o hands from standard chair; 11.16 secs w/o UE support    Time 4    Period  Weeks    Status Partially Met     Target Date 07/31/21      PT SHORT TERM GOAL #3   Title Pt will be able to ambulate at >/= 2.62 ft/sec for improved community ambulation.    Baseline 06/29/21- 2.51 ft/sec w/o AD; 3.34 ft/sec w/o AD    Time 4    Period Weeks    Status Achieved    Target Date 07/31/21      PT SHORT TERM GOAL #4   Title Pt will be able to ambulate 500' over unlevel surfaces w/ LRAD vs no AD for improved functional mobility and community ambulation.    Time 4    Period Weeks    Status Partially Met    Target Date 07/31/21               PT Long Term Goals - 07/14/21 1719       PT LONG TERM GOAL #1   Title Pt will be independent w/ final balance focused HEP.    Time 8    Period Weeks    Status New    Target Date 08/28/21      PT LONG TERM GOAL #2   Title Pt will improve score to >/= 25/30 on FGA demonstrating a decreased fall risk.    Baseline 07/08/21-19/30    Time 8    Period Weeks    Status New    Target Date 08/28/21      PT LONG TERM GOAL #3   Title Pt will be able to go full 30 seconds on condition 4 on Modified CTSIB for improved balance.    Baseline 07/08/21/- Full 30 seconds conditions 1-3, 15 seconds on condition 4    Time 8    Period Weeks    Status New    Target Date 08/28/21      PT LONG TERM GOAL #4   Title Pt will be able to ambulate 1000' over varied surfaces w/ LRAD vs no AD for improved community ambulation.    Time 8    Period Weeks    Status New    Target Date 08/28/21                   Plan - 07/24/21 1241     Clinical Impression Statement Completed assesment of patient's progress toward STG. Patient able to meet or partially meet all STGs. Demonstrating improved gait speed at 3.34 ft/sec and improved time on 5x sit <> stand to 11.16 secs without UE support. Pt continue to demo mild unsteadiness with SLS activites and on complaint surfaces. Reviewed and progressed HEP to patient's tolernace during rest of session. Will continue to progress toward all  LTGs.    Personal Factors and Comorbidities Comorbidity 3+    Comorbidities Past medical history of neuropathy, hypertension, hyperlipidemia, anxiety, depression, colon cancer treated with surgery, OA, hypothyroid, history of alcohol abuse but in remission.    Examination-Activity Limitations Caring for Others;Locomotion Level;Stairs;Carry    Examination-Participation Restrictions Yard Work;Occupation;Community Activity;Cleaning    Stability/Clinical Decision Making Evolving/Moderate complexity    Rehab Potential Good    PT Frequency 2x / week   plus eval   PT Duration 8 weeks    PT Treatment/Interventions ADLs/Self Care Home Management;Aquatic Therapy;Moist Heat;Cryotherapy;Gait training;Functional mobility training;Therapeutic activities;Therapeutic exercise;Balance training;Manual techniques;Neuromuscular re-education;Passive range of motion;Vestibular;Patient/family education;Canalith Repostioning;Stair training;DME Instruction    PT Next Visit Plan How was updated HEP? Continue VOR. Continue dynamic and static balance activities, BLE strengthening  pre knee replacement    Consulted and Agree with Plan of Care Patient             Patient will benefit from skilled therapeutic intervention in order to improve the following deficits and impairments:  Decreased balance, Abnormal gait, Dizziness, Decreased strength, Difficulty walking, Hypomobility, Impaired sensation, Decreased range of motion, Decreased endurance, Pain  Visit Diagnosis: Other abnormalities of gait and mobility  Unsteadiness on feet  Dizziness and giddiness  Difficulty in walking, not elsewhere classified  Muscle weakness (generalized)     Problem List Patient Active Problem List   Diagnosis Date Noted   Centrilobular emphysema ([place]) 12/13/2020   Radiculopathy 11/24/2016   H/O colon cancer, stage I 01/19/2013   Nonspecific abnormal electrocardiogram (ECG) (EKG) 03/31/2011   Chest pain 03/31/2011    Hyperlipidemia 03/31/2011   Hypertension 03/31/2011   Tobacco abuse 03/31/2011    Jones Bales, PT, DPT 07/24/2021, 12:47 PM  [facility] [address] [place], [place], [address] Phone: [phone]   Fax:  [phone]  Name: [name] MRN: [id number] Date of Birth: 10-20-1950

## 2021-07-24 NOTE — Patient Instructions (Addendum)
Gaze Stabilization: Standing Feet Together    Feet together, keeping eyes on target on mirror 3-4 feet away, tilt head down 15-30 and move head side to side for 60 seconds. Repeat while moving head up and down for 60 seconds. Do 2-3 sessions per day. Copyright  VHI. All rights reserved.     Access Code: [email or web address] URL: [email or web address] Date: 07/24/2021 Prepared by: Baldomero Lamy  Exercises Seated Nose to Left Knee Vestibular Habituation - 1 x daily - 7 x weekly - 3 sets - 5 reps Staggered Sit-to-Stand - 1 x daily - 7 x weekly - 4 sets - 5 reps Romberg Stance Eyes Closed on Foam Pad - 1 x daily - 7 x weekly - 3 sets - 30 hold Standing Feet Apart with Eyes Closed and Head Nods on Foam Pad - 1 x daily - 7 x weekly - 2 sets - 10 reps Standing Feet Apart with Eyes Closed and Head Rotation on Foam Pad - 1 x daily - 7 x weekly - 2 sets - 10 reps

## 2021-07-28 ENCOUNTER — Ambulatory Visit: Payer: Medicare Other

## 2021-07-30 ENCOUNTER — Other Ambulatory Visit: Payer: Self-pay

## 2021-07-30 ENCOUNTER — Ambulatory Visit: Payer: Medicare Other

## 2021-07-30 DIAGNOSIS — R262 Difficulty in walking, not elsewhere classified: Secondary | ICD-10-CM | POA: Diagnosis not present

## 2021-07-30 DIAGNOSIS — M6281 Muscle weakness (generalized): Secondary | ICD-10-CM | POA: Diagnosis not present

## 2021-07-30 DIAGNOSIS — R2681 Unsteadiness on feet: Secondary | ICD-10-CM | POA: Diagnosis not present

## 2021-07-30 DIAGNOSIS — R42 Dizziness and giddiness: Secondary | ICD-10-CM | POA: Diagnosis not present

## 2021-07-30 DIAGNOSIS — R2689 Other abnormalities of gait and mobility: Secondary | ICD-10-CM

## 2021-07-30 NOTE — Therapy (Signed)
[facility] [address] [place], [place], [address] Phone: [phone]   Fax:  [phone]  Physical Therapy Treatment  Patient Details  Name: [name] MRN: [id number] Date of Birth: 07-24-1951 Referring Provider (PT): Alric Ran, MD   Encounter Date: 07/30/2021   PT End of Session - 07/30/21 1536     Visit Number 8    Number of Visits 17    Date for PT Re-Evaluation 08/28/21    Authorization Type Medicare part A and part B/ BCBS    Progress Note Due on Visit 10    PT Start Time 1532    PT Stop Time 1612    PT Time Calculation (min) 40 min    Equipment Utilized During Treatment Gait belt    Activity Tolerance Patient tolerated treatment well    Behavior During Therapy WFL for tasks assessed/performed             Past Medical History:  Diagnosis Date   Alcohol abuse, in remission    Cancer of sigmoid colon ([place]) 07/2005   [id number]   Chronic bronchitis    Depression    Dyspnea    Gastritis    GERD (gastroesophageal reflux disease)    History of hiatal hernia    Hyperlipidemia    Hypertension    Hypothyroid    no meds   Migraines    OA (osteoarthritis)    PONV (postoperative nausea and vomiting)    Reflux     Past Surgical History:  Procedure Laterality Date   ABDOMINAL EXPOSURE N/A 11/24/2016   Procedure: ABDOMINAL EXPOSURE;  Surgeon: Rosetta Posner, MD;  Location: [facility];  Service: Vascular;  Laterality: N/A;   ABDOMINAL HYSTERECTOMY     ANTERIOR LUMBAR FUSION Bilateral 11/24/2016   Procedure: LUMBAR 5-SACRUM 1 ANTERIOR LUMBAR INTERBODY FUSION;  Surgeon: Phylliss Bob, MD;  Location: [facility];  Service: Orthopedics;  Laterality: Bilateral;  LUMBAR 5-SACRUM 1 ANTERIOR LUMBAR INTERBODY FUSION    APPENDECTOMY     CHOLECYSTECTOMY     COLON RESECTION     COLONOSCOPY     HAND SURGERY     repair of incisional hernia     SHOULDER SURGERY     both shoulders   TONSILLECTOMY      There were no  vitals filed for this visit.   Subjective Assessment - 07/30/21 1534     Subjective Reports the R knee was given her trouble over the weekend, and is still their present. No other changes. No falls. Report one she had one trip but was able to catch herself on the couch.    Patient Stated Goals Improve balance to not fall    Currently in Pain? Yes    Pain Score 7     Pain Location Knee    Pain Orientation Right    Pain Descriptors / Indicators Aching    Pain Type Acute pain    Pain Onset In the past 7 days    Pain Frequency Intermittent                               [facility] Adult PT Treatment/Exercise - 07/30/21 0001       Transfers   Transfers Sit to Stand;Stand to Sit    Sit to Stand 7: Independent    Stand to Sit 7: Independent      Ambulation/Gait   Ambulation/Gait Yes  Ambulation/Gait Assistance 5: Supervision    Ambulation/Gait Assistance Details throughout therapy gym with activities    Assistive device None    Gait Pattern Step-to pattern;Antalgic    Ambulation Surface Level;Indoor               Balance Exercises - 07/30/21 0001       Balance Exercises: Standing   Standing Eyes Opened Narrow base of support (BOS);Foam/compliant surface;2 reps;30 secs;Limitations    Standing Eyes Opened Limitations standing EO on airex 2 x 30 seconds with narrow BOS    Standing Eyes Closed Narrow base of support (BOS);Head turns;Foam/compliant surface;3 reps;30 secs;Limitations    Standing Eyes Closed Limitations EC with narrow BOS 3 x 30 seconds; then progressed to horizontal/vertical head turns x 10 reps.    SLS Eyes open;Foam/compliant surface;3 reps;15 secs;Limitations    SLS Limitations with [name] standing on airex, and intermittent UE support required    Rockerboard Anterior/posterior;Head turns;EO;Intermittent UE support;Limitations    Rockerboard Limitations standing on rockerboard A/P focus on holding steady, completed horizontal/vertical head turns  with EO x 10 reps. Then completed EC 2 x 30 seconds. Increased posterior sway noted.    Tandem Gait Forward;Retro;Intermittent upper extremity support;3 reps;Limitations    Tandem Gait Limitations on firm surface, compelted forward followed by retro tandem gait x 3 laps.    Sidestepping Foam/compliant support;Upper extremity support;Limitations    Sidestepping Limitations standing across blue balance beam completed side stepping x 3 laps down and back. Intermittent UE support.                  PT Short Term Goals - 07/24/21 1156       PT SHORT TERM GOAL #1   Title Pt will be independent w/ initial HEP focused on balance.    Baseline reports independence with updated HEP    Time 4    Period Weeks    Status Achieved    Target Date 07/31/21      PT SHORT TERM GOAL #2   Title Pt will be able to perform 5x sit <> stand in </= 10 seconds to demonstrate a decreased fall risk.    Baseline 06/29/21- 12.18 sec w/o hands from standard chair; 11.16 secs w/o UE support    Time 4    Period Weeks    Status Partially Met    Target Date 07/31/21      PT SHORT TERM GOAL #3   Title Pt will be able to ambulate at >/= 2.62 ft/sec for improved community ambulation.    Baseline 06/29/21- 2.51 ft/sec w/o AD; 3.34 ft/sec w/o AD    Time 4    Period Weeks    Status Achieved    Target Date 07/31/21      PT SHORT TERM GOAL #4   Title Pt will be able to ambulate 500' over unlevel surfaces w/ LRAD vs no AD for improved functional mobility and community ambulation.    Time 4    Period Weeks    Status Partially Met    Target Date 07/31/21               PT Long Term Goals - 07/14/21 1719       PT LONG TERM GOAL #1   Title Pt will be independent w/ final balance focused HEP.    Time 8    Period Weeks    Status New    Target Date 08/28/21      PT LONG TERM GOAL #2  Title Pt will improve score to >/= 25/30 on FGA demonstrating a decreased fall risk.    Baseline 07/08/21-19/30    Time  8    Period Weeks    Status New    Target Date 08/28/21      PT LONG TERM GOAL #3   Title Pt will be able to go full 30 seconds on condition 4 on Modified CTSIB for improved balance.    Baseline 07/08/21/- Full 30 seconds conditions 1-3, 15 seconds on condition 4    Time 8    Period Weeks    Status New    Target Date 08/28/21      PT LONG TERM GOAL #4   Title Pt will be able to ambulate 1000' over varied surfaces w/ LRAD vs no AD for improved community ambulation.    Time 8    Period Weeks    Status New    Target Date 08/28/21                   Plan - 07/30/21 1626     Clinical Impression Statement Continued balance activities and SLS activities today, with main focus on [name] due to RLE pain. Patient was slightly limited in participation due to RLE pain/stiffness today. Will continue to progress toward all LTGs. PLanned d/c next visit due to upcoming TKA.    Personal Factors and Comorbidities Comorbidity 3+    Comorbidities Past medical history of neuropathy, hypertension, hyperlipidemia, anxiety, depression, colon cancer treated with surgery, OA, hypothyroid, history of alcohol abuse but in remission.    Examination-Activity Limitations Caring for Others;Locomotion Level;Stairs;Carry    Examination-Participation Restrictions Yard Work;Occupation;Community Activity;Cleaning    Stability/Clinical Decision Making Evolving/Moderate complexity    Rehab Potential Good    PT Frequency 2x / week   plus eval   PT Duration 8 weeks    PT Treatment/Interventions ADLs/Self Care Home Management;Aquatic Therapy;Moist Heat;Cryotherapy;Gait training;Functional mobility training;Therapeutic activities;Therapeutic exercise;Balance training;Manual techniques;Neuromuscular re-education;Passive range of motion;Vestibular;Patient/family education;Canalith Repostioning;Stair training;DME Instruction    PT Next Visit Plan Continue VOR. Continue dynamic and static balance activities, BLE  strengthening pre knee replacement. planned updated HEP and d/c next week.    Consulted and Agree with Plan of Care Patient             Patient will benefit from skilled therapeutic intervention in order to improve the following deficits and impairments:  Decreased balance, Abnormal gait, Dizziness, Decreased strength, Difficulty walking, Hypomobility, Impaired sensation, Decreased range of motion, Decreased endurance, Pain  Visit Diagnosis: Other abnormalities of gait and mobility  Unsteadiness on feet  Dizziness and giddiness  Difficulty in walking, not elsewhere classified  Muscle weakness (generalized)     Problem List Patient Active Problem List   Diagnosis Date Noted   Centrilobular emphysema ([place]) 12/13/2020   Radiculopathy 11/24/2016   H/O colon cancer, stage I 01/19/2013   Nonspecific abnormal electrocardiogram (ECG) (EKG) 03/31/2011   Chest pain 03/31/2011   Hyperlipidemia 03/31/2011   Hypertension 03/31/2011   Tobacco abuse 03/31/2011    Jones Bales, PT, DPT 07/30/2021, 4:30 PM  [facility] [address] [place], [place], [address] Phone: [phone]   Fax:  [phone]  Name: [name] MRN: [id number] Date of Birth: 02/04/1951

## 2021-07-30 NOTE — Patient Instructions (Signed)
DUE TO COVID-19 ONLY ONE VISITOR IS ALLOWED TO COME WITH YOU AND STAY IN THE WAITING ROOM ONLY DURING PRE OP AND PROCEDURE DAY OF SURGERY IF YOU ARE GOING HOME AFTER SURGERY. IF YOU ARE SPENDING THE NIGHT 2 PEOPLE MAY VISIT WITH YOU IN YOUR PRIVATE ROOM AFTER SURGERY UNTIL VISITING  HOURS ARE OVER AT 800 PM AND 1  VISITOR  MAY  SPEND THE NIGHT.                  [name]    Your procedure is scheduled on: 08/11/21   Report to [facility] Main  Entrance   Report to short stay at 5:15 AM     Call this number if you have problems the morning of surgery [phone]    No food after midnight.    You may have clear liquid until 4:30 AM.    At 4:00 AM drink pre surgery drink.   Nothing by mouth after 4:30 AM.   CLEAR LIQUID DIET   Foods Allowed                                                                     Foods Excluded  Coffee and tea, regular and decaf                             liquids that you cannot  Plain Jell-O any favor except red or purple                                           see through such as: Fruit ices (not with fruit pulp)                                     milk, soups, orange juice  Iced Popsicles                                    All solid food Carbonated beverages, regular and diet                                    Cranberry, grape and apple juices Sports drinks like Gatorade Lightly seasoned clear broth or consume(fat free) Sugar     BRUSH YOUR TEETH MORNING OF SURGERY AND RINSE YOUR MOUTH OUT, NO CHEWING GUM CANDY OR MINTS.     Take these medicines the morning of surgery with A SIP OF WATER: Gabapentin, Abilify                                You may not have any metal on your body including hair pins and              piercings  Do not wear jewelry, make-up, lotions, powders or perfumes, deodorant  Do not wear nail polish on your fingernails.  Do not shave  48 hours prior to surgery.                 Do not bring  valuables to the hospital. [facility].  Contacts, dentures or bridgework may not be worn into surgery.     Patients discharged the day of surgery will not be allowed to drive home.  IF YOU ARE HAVING SURGERY AND GOING HOME THE SAME DAY, YOU MUST HAVE AN ADULT TO DRIVE YOU HOME AND BE WITH YOU FOR 24 HOURS. YOU MAY GO HOME BY TAXI OR UBER OR ORTHERWISE, BUT AN ADULT MUST ACCOMPANY YOU HOME AND STAY WITH YOU FOR 24 HOURS.  Name and phone number of your driver:  Special Instructions: N/A              Please read over the following fact sheets you were given: _____________________________________________________________________             [facility] - Preparing for Surgery Before surgery, you can play an important role.  Because skin is not sterile, your skin needs to be as free of germs as possible.  You can reduce the number of germs on your skin by washing with CHG (chlorahexidine gluconate) soap before surgery.  CHG is an antiseptic cleaner which kills germs and bonds with the skin to continue killing germs even after washing. Please DO NOT use if you have an allergy to CHG or antibacterial soaps.  If your skin becomes reddened/irritated stop using the CHG and inform your nurse when you arrive at Short Stay. Do not shave (including legs and underarms) for at least 48 hours prior to the first CHG shower.   Please follow these instructions carefully:  1.  Shower with CHG Soap the night before surgery and the  morning of Surgery.  2.  If you choose to wash your hair, wash your hair first as usual with your  normal  shampoo.  3.  After you shampoo, rinse your hair and body thoroughly to remove the  shampoo.                            4.  Use CHG as you would any other liquid soap.  You can apply chg directly  to the skin and wash                       Gently with a scrungie or clean washcloth.  5.  Apply the CHG Soap to your body ONLY FROM THE  NECK DOWN.   Do not use on face/ open                           Wound or open sores. Avoid contact with eyes, ears mouth and genitals (private parts).                       Wash face,  Genitals (private parts) with your normal soap.             6.  Wash thoroughly, paying special attention to the area where your surgery  will be performed.  7.  Thoroughly rinse your body with warm water from the neck down.  8.  DO  NOT shower/wash with your normal soap after using and rinsing off  the CHG Soap.                9.  Pat yourself dry with a clean towel.            10.  Wear clean pajamas.            11.  Place clean sheets on your bed the night of your first shower and do not  sleep with pets. Day of Surgery : Do not apply any lotions/deodorants the morning of surgery.  Please wear clean clothes to the hospital/surgery center.  FAILURE TO FOLLOW THESE INSTRUCTIONS MAY RESULT IN THE CANCELLATION OF YOUR SURGERY PATIENT SIGNATURE_________________________________  NURSE SIGNATURE__________________________________  ________________________________________________________________________   [name]  An incentive spirometer is a tool that can help keep your lungs clear and active. This tool measures how well you are filling your lungs with each breath. Taking long deep breaths may help reverse or decrease the chance of developing breathing (pulmonary) problems (especially infection) following: A long period of time when you are unable to move or be active. BEFORE THE PROCEDURE  If the spirometer includes an indicator to show your best effort, your nurse or respiratory therapist will set it to a desired goal. If possible, sit up straight or lean slightly forward. Try not to slouch. Hold the incentive spirometer in an upright position. INSTRUCTIONS FOR USE  Sit on the edge of your bed if possible, or sit up as far as you can in bed or on a chair. Hold the incentive spirometer in an  upright position. Breathe out normally. Place the mouthpiece in your mouth and seal your lips tightly around it. Breathe in slowly and as deeply as possible, raising the piston or the ball toward the top of the column. Hold your breath for 3-5 seconds or for as long as possible. Allow the piston or ball to fall to the bottom of the column. Remove the mouthpiece from your mouth and breathe out normally. Rest for a few seconds and repeat Steps 1 through 7 at least 10 times every 1-2 hours when you are awake. Take your time and take a few normal breaths between deep breaths. The spirometer may include an indicator to show your best effort. Use the indicator as a goal to work toward during each repetition. After each set of 10 deep breaths, practice coughing to be sure your lungs are clear. If you have an incision (the cut made at the time of surgery), support your incision when coughing by placing a pillow or rolled up towels firmly against it. Once you are able to get out of bed, walk around indoors and cough well. You may stop using the incentive spirometer when instructed by your caregiver.  RISKS AND COMPLICATIONS Take your time so you do not get dizzy or light-headed. If you are in pain, you may need to take or ask for pain medication before doing incentive spirometry. It is harder to take a deep breath if you are having pain. AFTER USE Rest and breathe slowly and easily. It can be helpful to keep track of a log of your progress. Your caregiver can provide you with a simple table to help with this. If you are using the spirometer at home, follow these instructions: [place] IF:  You are having difficultly using the spirometer. You have trouble using the spirometer as often as instructed. Your pain medication is not giving  enough relief while using the spirometer. You develop fever of 100.5 F (38.1 C) or higher. SEEK IMMEDIATE MEDICAL CARE IF:  You cough up bloody sputum that had  not been present before. You develop fever of 102 F (38.9 C) or greater. You develop worsening pain at or near the incision site. MAKE SURE YOU:  Understand these instructions. Will watch your condition. Will get help right away if you are not doing well or get worse. Document Released: 11/29/2006 Document Revised: 10/11/2011 Document Reviewed: 01/30/2007 [facility] Patient Information 2014 [place], [place].   ________________________________________________________________________

## 2021-07-31 ENCOUNTER — Other Ambulatory Visit: Payer: Self-pay

## 2021-07-31 ENCOUNTER — Encounter (HOSPITAL_COMMUNITY): Payer: Self-pay

## 2021-07-31 ENCOUNTER — Encounter (HOSPITAL_COMMUNITY)
Admission: RE | Admit: 2021-07-31 | Discharge: 2021-07-31 | Disposition: A | Discharge status: Home / Self Care | Payer: Medicare Other | Source: Ambulatory Visit | Attending: Orthopedic Surgery | Admitting: Orthopedic Surgery

## 2021-07-31 VITALS — BP 160/87 | HR 89 | Temp 98.3°F | Resp 20 | Ht 65.0 in | Wt 180.0 lb

## 2021-07-31 DIAGNOSIS — E119 Type 2 diabetes mellitus without complications: Secondary | ICD-10-CM | POA: Diagnosis not present

## 2021-07-31 DIAGNOSIS — M1712 Unilateral primary osteoarthritis, left knee: Secondary | ICD-10-CM | POA: Insufficient documentation

## 2021-07-31 DIAGNOSIS — F17210 Nicotine dependence, cigarettes, uncomplicated: Secondary | ICD-10-CM | POA: Diagnosis not present

## 2021-07-31 DIAGNOSIS — J449 Chronic obstructive pulmonary disease, unspecified: Secondary | ICD-10-CM | POA: Insufficient documentation

## 2021-07-31 DIAGNOSIS — F1021 Alcohol dependence, in remission: Secondary | ICD-10-CM | POA: Diagnosis not present

## 2021-07-31 DIAGNOSIS — Z01812 Encounter for preprocedural laboratory examination: Secondary | ICD-10-CM | POA: Diagnosis present

## 2021-07-31 DIAGNOSIS — Z01818 Encounter for other preprocedural examination: Secondary | ICD-10-CM

## 2021-07-31 DIAGNOSIS — I10 Essential (primary) hypertension: Secondary | ICD-10-CM | POA: Insufficient documentation

## 2021-07-31 HISTORY — DX: Type 2 diabetes mellitus without complications: E11.9

## 2021-07-31 HISTORY — DX: Personal history of other endocrine, nutritional and metabolic disease: Z86.39

## 2021-07-31 LAB — CBC
HCT: 41.9 % (ref 36.0–46.0)
Hemoglobin: 13.5 g/dL (ref 12.0–15.0)
MCH: 32.5 pg (ref 26.0–34.0)
MCHC: 32.2 g/dL (ref 30.0–36.0)
MCV: 100.7 fL — ABNORMAL HIGH (ref 80.0–100.0)
Platelets: 252 10*3/uL (ref 150–400)
RBC: 4.16 MIL/uL (ref 3.87–5.11)
RDW: 13.5 % (ref 11.5–15.5)
WBC: 11.5 10*3/uL — ABNORMAL HIGH (ref 4.0–10.5)
nRBC: 0 % (ref 0.0–0.2)

## 2021-07-31 LAB — COMPREHENSIVE METABOLIC PANEL
ALT: 16 U/L (ref 0–44)
AST: 19 U/L (ref 15–41)
Albumin: 3.6 g/dL (ref 3.5–5.0)
Alkaline Phosphatase: 80 U/L (ref 38–126)
Anion gap: 7 (ref 5–15)
BUN: 13 mg/dL (ref 8–23)
CO2: 28 mmol/L (ref 22–32)
Calcium: 8.9 mg/dL (ref 8.9–10.3)
Chloride: 102 mmol/L (ref 98–111)
Creatinine, Ser: 0.76 mg/dL (ref 0.44–1.00)
GFR, Estimated: >60 mL/min (ref >60)
Glucose, Bld: 104 mg/dL — ABNORMAL HIGH (ref 70–99)
Potassium: 4.1 mmol/L (ref 3.5–5.1)
Sodium: 137 mmol/L (ref 135–145)
Total Bilirubin: 0.6 mg/dL (ref 0.3–1.2)
Total Protein: 7.1 g/dL (ref 6.5–8.1)

## 2021-07-31 LAB — GLUCOSE, CAPILLARY: Glucose-Capillary: 110 mg/dL — ABNORMAL HIGH (ref 70–99)

## 2021-07-31 LAB — SURGICAL PCR SCREEN
MRSA, PCR: NEGATIVE
Staphylococcus aureus: NEGATIVE

## 2021-07-31 LAB — HEMOGLOBIN A1C
Hgb A1c MFr Bld: 6.8 % — ABNORMAL HIGH (ref 4.8–5.6)
Mean Plasma Glucose: 148.46 mg/dL

## 2021-07-31 NOTE — Progress Notes (Signed)
COVID test- NA   PCP - Dr. Solon Palm Cardiologist - [facility]  Chest x-ray - 03/11/21-epic EKG - 01/23/21-epic Stress Test - no ECHO - 11/26/20-epic Cardiac Cath -NA  Pacemaker/ICD device last checked:NA  Sleep Study - no CPAP -   Fasting Blood Sugar - Doesn't test Checks Blood Sugar _____ times a day  Blood Thinner Instructions:NA Aspirin Instructions: Last Dose:  Anesthesia review: yes  Patient denies shortness of breath, fever, cough and chest pain at PAT appointment Pt is a smoker and had a pulmonary function test 10/30/20.  She gets SOB climbing stairs and walking any distance but not while doing housework or with ADLs. Her balance is off and she has had falls. She cares for her Significant other who has MS.  Patient verbalized understanding of instructions that were given to them at the PAT appointment. Patient was also instructed that they will need to review over the PAT instructions again at home before surgery. yes

## 2021-08-04 ENCOUNTER — Other Ambulatory Visit: Payer: Self-pay

## 2021-08-04 ENCOUNTER — Ambulatory Visit: Payer: Medicare Other | Attending: Family Medicine

## 2021-08-04 ENCOUNTER — Encounter (HOSPITAL_COMMUNITY): Payer: Self-pay

## 2021-08-04 DIAGNOSIS — M6281 Muscle weakness (generalized): Secondary | ICD-10-CM | POA: Insufficient documentation

## 2021-08-04 DIAGNOSIS — R262 Difficulty in walking, not elsewhere classified: Secondary | ICD-10-CM | POA: Diagnosis not present

## 2021-08-04 DIAGNOSIS — R2681 Unsteadiness on feet: Secondary | ICD-10-CM | POA: Diagnosis not present

## 2021-08-04 DIAGNOSIS — R42 Dizziness and giddiness: Secondary | ICD-10-CM | POA: Insufficient documentation

## 2021-08-04 DIAGNOSIS — R2689 Other abnormalities of gait and mobility: Secondary | ICD-10-CM | POA: Diagnosis not present

## 2021-08-04 NOTE — H&P (Signed)
KNEE ARTHROPLASTY ADMISSION H&P  Patient ID: [name] MRN: [id number] DOB/AGE: 1951-07-28 71 y.o.  Chief Complaint: left knee pain.  Planned Procedure Date: 08/11/21 Medical Clearance by Dr. Stephanie Acre   Cardiac Clearance by Dr. Davina Poke Pulmonary clearance by Dr. Loanne Drilling   HPI: [name] is a 71 y.o. female who presents for evaluation of OA LEFT KNEE. The patient has a history of pain and functional disability in the left knee due to arthritis and has failed non-surgical conservative treatments for greater than 12 weeks to include NSAID's and/or analgesics, corticosteriod injections, viscosupplementation injections, use of assistive devices, weight reduction as appropriate, and activity modification.  Onset of symptoms was gradual, starting >10 years ago with gradually worsening course since that time. The patient noted no past surgery on the left knee.  Patient currently rates pain at 5 out of 10 with activity. Patient has night pain, worsening of pain with activity and weight bearing, and pain that interferes with activities of daily living.  Patient has evidence of subchondral sclerosis, periarticular osteophytes, and joint space narrowing by imaging studies.  There is no active infection.  Past Medical History:  Diagnosis Date   Alcohol abuse, in remission    Cancer of sigmoid colon ([place]) 07/2005   T2N0M0   Chronic bronchitis    COPD (chronic obstructive pulmonary disease) (HCC)    Depression    Diabetes mellitus without complication (HCC)    Dyspnea    GERD (gastroesophageal reflux disease)    History of thyroid nodule    monitoring   Hyperlipidemia    Hypertension    Migraines    OA (osteoarthritis)    PONV (postoperative nausea and vomiting)    Reflux    Past Surgical History:  Procedure Laterality Date   ABDOMINAL EXPOSURE N/A 11/24/2016   Procedure: ABDOMINAL EXPOSURE;  Surgeon: Rosetta Posner, MD;  Location: [facility];  Service: Vascular;  Laterality: N/A;    ABDOMINAL HYSTERECTOMY  1992   age 26 and appendics   ANTERIOR LUMBAR FUSION Bilateral 11/24/2016   Procedure: LUMBAR 5-SACRUM 1 ANTERIOR LUMBAR INTERBODY FUSION;  Surgeon: Phylliss Bob, MD;  Location: [facility];  Service: Orthopedics;  Laterality: Bilateral;  LUMBAR 5-SACRUM 1 ANTERIOR LUMBAR INTERBODY FUSION    CHOLECYSTECTOMY  2007   COLON RESECTION  2006   COLONOSCOPY  2019   HAND SURGERY  2010   repair of incisional hernia  2012   SHOULDER ARTHROSCOPY Left 2009   SHOULDER SURGERY Right 1997   rotator cuff   TONSILLECTOMY     as a child   Allergies  Allergen Reactions   Benadryl [Diphenhydramine Hcl] Nausea And Vomiting and Other (See Comments)    Migraine headaches.   Lansoprazole Nausea And Vomiting and Other (See Comments)    Migraine headache   Morphine Sulfate Nausea And Vomiting and Other (See Comments)    Migraine headaches.   Penicillins Nausea And Vomiting and Other (See Comments)    INTOLERANCE >  NAUSEA & VOMITING YEAST INFECTION > CHANGE IN NORMAL FLORA  Has patient had a PCN reaction causing immediate rash, facial/tongue/throat swelling, SOB or lightheadedness with hypotension:No Has patient had a PCN reaction causing severe rash involving mucus membranes or skin necrosis: No Has patient had a PCN reaction that required hospitalization No Has patient had a PCN reaction occurring within the last 10 years:No    Red Dye Other (See Comments)    Also allergic to [facility] Dye-migraine headaches.   Simvastatin Nausea And Vomiting and Other (  See Comments)    Migraine    Codeine Nausea And Vomiting   Shellfish Allergy Other (See Comments)    Congestion   Spiriva Respimat [Tiotropium Bromide Monohydrate] Cough   Sulfa Antibiotics Nausea And Vomiting   Prior to Admission medications   Medication Sig Start Date End Date Taking? Authorizing Provider  amitriptyline (ELAVIL) 100 MG tablet Take 100 mg by mouth at bedtime.   Yes [provider]  ARIPiprazole (ABILIFY)  15 MG tablet Take 15 mg by mouth daily. 02/12/20  Yes [provider]  calcium carbonate (TUMS - DOSED IN MG ELEMENTAL CALCIUM) 500 MG chewable tablet Chew 2 tablets by mouth 4 (four) times daily as needed for indigestion or heartburn.   Yes [provider]  Cholecalciferol (VITAMIN D) 2000 units CAPS Take 2,000 Units by mouth at bedtime.   Yes [provider]  Cyanocobalamin (VITAMIN B-12) 5000 MCG TBDP Take 5,000 mcg by mouth at bedtime.   Yes [provider]  gabapentin (NEURONTIN) 100 MG capsule Take 1 capsule (100 mg total) by mouth 3 (three) times daily. 06/18/21 07/24/21 Yes Alric Ran, MD  losartan (COZAAR) 25 MG tablet Take 1 tablet (25 mg total) by mouth daily. 10/24/20 07/24/21 Yes O'Neal, Cassie Freer, MD  metFORMIN (GLUCOPHAGE-XR) 500 MG 24 hr tablet Take 1,000 mg by mouth every evening. 07/31/20  Yes [provider]  PARoxetine (PAXIL) 20 MG tablet Take 20 mg by mouth daily. 02/11/20  Yes [provider]  rosuvastatin (CRESTOR) 20 MG tablet TAKE 1 TABLET BY MOUTH AT BEDTIME Patient taking differently: Take 20 mg by mouth daily. 10/27/20  Yes O'Neal, Cassie Freer, MD   Social History   Socioeconomic History   Marital status: Domestic Partner    Spouse name: Not on file   Number of children: 0   Years of education: Not on file   Highest education level: Some college, no degree  Occupational History   Occupation: pet care  Tobacco Use   Smoking status: Every Day    Packs/day: 0.50    Years: 50.00    Pack years: 25.00    Types: Cigarettes, E-cigarettes    Start date: 1971   Smokeless tobacco: Never   Tobacco comments:    0.5 ppd of cigerettes , using e cigarette   Vaping Use   Vaping Use: Every day   Substances: Nicotine, Flavoring  Substance and Sexual Activity   Alcohol use: No    Comment: recovering  alcoholic   Drug use: No   Sexual activity: Not on file  Other Topics Concern   Not on file  Social History  Narrative   Caffeine 3 cups per day    Lives with Partner [name]   Social Determinants of Health   Financial Resource Strain: Not on file  Food Insecurity: Not on file  Transportation Needs: Not on file  Physical Activity: Not on file  Stress: Not on file  Social Connections: Not on file   Family History  Problem Relation Age of Onset   Heart disease Mother        Previous MI at age 42-55   Atrial fibrillation Mother    Stroke Mother    Heart failure Mother    Seizures Father        poss d/t alcohol   Cancer Father 52       Prostate cancer    Diabetes Sister    Arrhythmia Sister    Cancer Maternal Aunt  lung cancer    Cancer Maternal Uncle        kidney ca    ROS: Currently denies lightheadedness, dizziness, Fever, chills, CP, SOB.   No personal history of DVT, PE, MI, or CVA. No loose teeth. Has partial dentures All other systems have been reviewed and were otherwise currently negative with the exception of those mentioned in the HPI and as above.  Objective: Vitals: Ht: 5'6" Wt: 183 lbs Temp: 98.1 BP: 165/89 Pulse: 77 O2 95% on room air.   Physical Exam: General: Alert, NAD.  Antalgic Gait  HEENT: EOMI, Good Neck Extension  Pulm: No increased work of breathing.  Clear B/L A/P w/o crackle or wheeze.  CV: RRR, No m/g/r appreciated  GI: soft, NT, ND. BS x 4 quadrants Neuro: CN II-XII grossly intact without focal deficit.  Sensation intact distally Skin: No lesions in the area of chief complaint MSK/Surgical Site: left knee  + JLT. ROM 0-115 degrees.  Decreased strength in extension and flexion.  +EHL/FHL.  NVI.  Stable varus and valgus stress.    Imaging Review Plain radiographs demonstrate severe degenerative joint disease of the bilateral knees, left worse than right.   The overall alignment is mild varus. The bone quality appears to be fair for age and reported activity level.  Preoperative templating of the joint replacement has been completed,  documented, and submitted to the Operating Room personnel in order to optimize intra-operative equipment management.  Assessment: OA LEFT KNEE Active Problems:   * No active hospital problems. *   Plan: Plan for Procedure(s): TOTAL KNEE ARTHROPLASTY  The patient history, physical exam, clinical judgement of the provider and imaging are consistent with end stage degenerative joint disease and total joint arthroplasty is deemed medically necessary. The treatment options including medical management, injection therapy, and arthroplasty were discussed at length. The risks and benefits of Procedure(s): TOTAL KNEE ARTHROPLASTY were presented and reviewed.  The risks of nonoperative treatment, versus surgical intervention including but not limited to continued pain, aseptic loosening, stiffness, dislocation/subluxation, infection, bleeding, nerve injury, blood clots, cardiopulmonary complications, morbidity, mortality, among others were discussed. The patient verbalizes understanding and wishes to proceed with the plan.  Patient is being admitted for inpatient treatment for surgery, pain control, PT, prophylactic antibiotics, VTE prophylaxis, progressive ambulation, ADL's and discharge planning.   Dental prophylaxis discussed and recommended for 2 years postoperatively.  The patient does meet the criteria for TXA which will be used perioperatively.   ASA 81 mg BID will be used postoperatively for DVT prophylaxis in addition to SCDs, and early ambulation. Plan for Tylenol, Celebrex, oxycodone for pain. Will continue to take her Gabapentin 100mg  tid as well. Robaxin for muscle spasms.   Zofran for nausea and vomiting. Has h/o severe PONV so will need large supply. Omeprazole for gastric protection. Pharmacy- [facility] on [address] The patient is planning to be discharged home with OPPT and into the care of her partner [name] who can be reached at [phone] Follow up appt 08/26/21 at  [facility], [place] Office [phone] 08/04/2021 3:17 PM

## 2021-08-04 NOTE — Therapy (Signed)
[name] [address] [place], [place], [address] Phone: [phone]   Fax:  [phone]  Physical Therapy Treatment  Patient Details  Name: [name] MRN: [id number] Date of Birth: January 28, 1951 Referring Provider (PT): Alric Ran, MD   Encounter Date: 08/04/2021   PT End of Session - 08/04/21 1401     Visit Number 9    Number of Visits 17    Date for PT Re-Evaluation 08/28/21    Authorization Type Medicare part A and part B/ BCBS    Progress Note Due on Visit 10    PT Start Time 4037    PT Stop Time 1443    PT Time Calculation (min) 41 min    Equipment Utilized During Treatment Gait belt    Activity Tolerance Patient tolerated treatment well    Behavior During Therapy WFL for tasks assessed/performed             Past Medical History:  Diagnosis Date   Alcohol abuse, in remission    Cancer of sigmoid colon ([place]) 07/2005   T2N0M0   Chronic bronchitis    COPD (chronic obstructive pulmonary disease) ([place] [facility])    Depression    Diabetes mellitus without complication (HCC)    Dyspnea    GERD (gastroesophageal reflux disease)    History of thyroid nodule    monitoring   Hyperlipidemia    Hypertension    Migraines    OA (osteoarthritis)    PONV (postoperative nausea and vomiting)    Reflux     Past Surgical History:  Procedure Laterality Date   ABDOMINAL EXPOSURE N/A 11/24/2016   Procedure: ABDOMINAL EXPOSURE;  Surgeon: Rosetta Posner, MD;  Location: [facility];  Service: Vascular;  Laterality: N/A;   ABDOMINAL HYSTERECTOMY  1992   age 50 and appendics   ANTERIOR LUMBAR FUSION Bilateral 11/24/2016   Procedure: LUMBAR 5-SACRUM 1 ANTERIOR LUMBAR INTERBODY FUSION;  Surgeon: Phylliss Bob, MD;  Location: [facility];  Service: Orthopedics;  Laterality: Bilateral;  LUMBAR 5-SACRUM 1 ANTERIOR LUMBAR INTERBODY FUSION    CHOLECYSTECTOMY  2007   COLON RESECTION  2006   COLONOSCOPY  2019   HAND SURGERY  2010   repair  of incisional hernia  2012   SHOULDER ARTHROSCOPY Left 2009   SHOULDER SURGERY Right 1997   rotator cuff   TONSILLECTOMY     as a child    There were no vitals filed for this visit.   Subjective Assessment - 08/04/21 1401     Subjective Patient reports pre admission testing went well. R knee is still bothering you. No other new changes/complaints. No falls, but stumbles.    Patient Stated Goals Improve balance to not fall    Currently in Pain? Yes    Pain Score 6     Pain Location Knee    Pain Orientation Right    Pain Descriptors / Indicators Aching;Throbbing    Pain Type Chronic pain    Pain Radiating Towards into the R foot    Pain Onset In the past 7 days    Pain Frequency Intermittent                [facility] Adult PT Treatment/Exercise - 08/04/21 0001       Ambulation/Gait   Ambulation/Gait Yes    Ambulation/Gait Assistance 5: Supervision    Assistive device None    Gait Pattern Step-to pattern;Antalgic    Ambulation Surface Level;Indoor      Neuro Re-ed  Neuro Re-ed Details  PT educated on fall prevention handout, and reviewed safety within and outside of the home to promote safety (see medbridge)             Vestibular Treatment/Exercise - 08/04/21 0001       Vestibular Treatment/Exercise   Vestibular Treatment Provided Gaze    Gaze Exercises X1 Viewing Horizontal;X1 Viewing Vertical      X1 Viewing Horizontal   Foot Position seated (reviewed as this is how she will need to complete it at after surgery)    Reps 1    Comments x 60 seconds.      X1 Viewing Vertical   Foot Position seated (reviewed as this is how she will need to complete it at after surgery)    Reps 1    Comments x 60 seconds.                Balance Exercises - 08/04/21 0001       Balance Exercises: Standing   Standing Eyes Closed Narrow base of support (BOS);Head turns;Foam/compliant surface;3 reps;30 secs;Limitations    Standing Eyes Closed Limitations EC with  narrow BOS 3 x 30 seconds; then progressed to horizontal/vertical head turns x 10 reps with eyes closed    Tandem Stance Eyes open;Limitations;Foam/compliant surface    Tandem Stance Time standing partial tandem EO 2 x 30 secs each.    Tandem Gait Forward;Intermittent upper extremity support;3 reps;Limitations    Tandem Gait Limitations on firm surface, compelted forward tandem gait x 3 laps.    Marching Solid surface;Intermittent upper extremity assist;Static;Limitations    Marching Limitations at  countertop x 4 laps, cues for slowed controlled pace to promote SLS             Reviewed the following HEP:  Access Code: [email or web address] URL: [email or web address] Date: 08/04/2021 Prepared by: Baldomero Lamy  Exercises Seated Nose to Left Knee Vestibular Habituation - 1 x daily - 7 x weekly - 3 sets - 5 reps Staggered Sit-to-Stand - 1 x daily - 7 x weekly - 4 sets - 5 reps Romberg Stance Eyes Closed on Foam Pad - 1 x daily - 7 x weekly - 3 sets - 30 hold Standing Feet Apart with Eyes Closed and Head Nods on Foam Pad - 1 x daily - 7 x weekly - 2 sets - 10 reps Standing Feet Apart with Eyes Closed and Head Rotation on Foam Pad - 1 x daily - 7 x weekly - 2 sets - 10 reps  Patient Education Falls at [facility] and provided handout   PT Education - 08/04/21 1445     Education Details HEP review    Person(s) Educated Patient    Methods Explanation    Comprehension Verbalized understanding              PT Short Term Goals - 07/24/21 1156       PT SHORT TERM GOAL #1   Title Pt will be independent w/ initial HEP focused on balance.    Baseline reports independence with updated HEP    Time 4    Period Weeks    Status Achieved    Target Date 07/31/21      PT SHORT TERM GOAL #2   Title Pt will be able to perform 5x sit <> stand in </= 10 seconds to demonstrate a decreased fall risk.    Baseline 06/29/21- 12.18 sec w/o hands from standard chair; 11.16  secs w/o UE support  Time 4    Period Weeks    Status Partially Met    Target Date 07/31/21      PT SHORT TERM GOAL #3   Title Pt will be able to ambulate at >/= 2.62 ft/sec for improved community ambulation.    Baseline 06/29/21- 2.51 ft/sec w/o AD; 3.34 ft/sec w/o AD    Time 4    Period Weeks    Status Achieved    Target Date 07/31/21      PT SHORT TERM GOAL #4   Title Pt will be able to ambulate 500' over unlevel surfaces w/ LRAD vs no AD for improved functional mobility and community ambulation.    Time 4    Period Weeks    Status Partially Met    Target Date 07/31/21               PT Long Term Goals - 07/14/21 1719       PT LONG TERM GOAL #1   Title Pt will be independent w/ final balance focused HEP.    Time 8    Period Weeks    Status New    Target Date 08/28/21      PT LONG TERM GOAL #2   Title Pt will improve score to >/= 25/30 on FGA demonstrating a decreased fall risk.    Baseline 07/08/21-19/30    Time 8    Period Weeks    Status New    Target Date 08/28/21      PT LONG TERM GOAL #3   Title Pt will be able to go full 30 seconds on condition 4 on Modified CTSIB for improved balance.    Baseline 07/08/21/- Full 30 seconds conditions 1-3, 15 seconds on condition 4    Time 8    Period Weeks    Status New    Target Date 08/28/21      PT LONG TERM GOAL #4   Title Pt will be able to ambulate 1000' over varied surfaces w/ LRAD vs no AD for improved community ambulation.    Time 8    Period Weeks    Status New    Target Date 08/28/21                   Plan - 08/04/21 1502     Clinical Impression Statement PT provided education on fall prevention and reviewed HEP with patient to ensure compliance, patient reports independence with current HEP. No updates at this time. Rest of session focused on continued balance activities and VOR. Continue to be limited due to R knee pain. Planned d/c at next visit due to upcoming surgery.    Personal  Factors and Comorbidities Comorbidity 3+    Comorbidities Past medical history of neuropathy, hypertension, hyperlipidemia, anxiety, depression, colon cancer treated with surgery, OA, hypothyroid, history of alcohol abuse but in remission.    Examination-Activity Limitations Caring for Others;Locomotion Level;Stairs;Carry    Examination-Participation Restrictions Yard Work;Occupation;Community Activity;Cleaning    Stability/Clinical Decision Making Evolving/Moderate complexity    Rehab Potential Good    PT Frequency 2x / week   plus eval   PT Duration 8 weeks    PT Treatment/Interventions ADLs/Self Care Home Management;Aquatic Therapy;Moist Heat;Cryotherapy;Gait training;Functional mobility training;Therapeutic activities;Therapeutic exercise;Balance training;Manual techniques;Neuromuscular re-education;Passive range of motion;Vestibular;Patient/family education;Canalith Repostioning;Stair training;DME Instruction    PT Next Visit Plan Check LTG + D/C?    Consulted and Agree with Plan of Care Patient  Patient will benefit from skilled therapeutic intervention in order to improve the following deficits and impairments:  Decreased balance, Abnormal gait, Dizziness, Decreased strength, Difficulty walking, Hypomobility, Impaired sensation, Decreased range of motion, Decreased endurance, Pain  Visit Diagnosis: Other abnormalities of gait and mobility  Unsteadiness on feet  Dizziness and giddiness  Difficulty in walking, not elsewhere classified  Muscle weakness (generalized)     Problem List Patient Active Problem List   Diagnosis Date Noted   Centrilobular emphysema ([place]) 12/13/2020   Radiculopathy 11/24/2016   H/O colon cancer, stage I 01/19/2013   Nonspecific abnormal electrocardiogram (ECG) (EKG) 03/31/2011   Chest pain 03/31/2011   Hyperlipidemia 03/31/2011   Hypertension 03/31/2011   Tobacco abuse 03/31/2011    Jones Bales, PT, DPT 08/04/2021, 3:46  PM  [facility] [address] [place], [place], [address] Phone: [phone]   Fax:  [phone]  Name: QUANA CHAMBERLAIN MRN: [id number] Date of Birth: Dec 11, 1950

## 2021-08-04 NOTE — Anesthesia Preprocedure Evaluation (Addendum)
Anesthesia Evaluation  Patient identified by MRN, date of birth, ID band Patient awake    Reviewed: Allergy & Precautions, H&P , NPO status , Patient's Chart, lab work & pertinent test results  History of Anesthesia Complications (+) PONV and history of anesthetic complications  Airway Mallampati: II  TM Distance: >3 FB Neck ROM: Full    Dental no notable dental hx. (+) Teeth Intact, Dental Advisory Given   Pulmonary COPD, Current SmokerPatient did not abstain from smoking.,    Pulmonary exam normal breath sounds clear to auscultation       Cardiovascular Exercise Tolerance: Good hypertension, Pt. on medications  Rhythm:Regular Rate:Normal     Neuro/Psych  Headaches, Depression    GI/Hepatic Neg liver ROS, GERD  ,  Endo/Other  diabetes, Type 2, Oral Hypoglycemic Agents  Renal/GU negative Renal ROS  negative genitourinary   Musculoskeletal  (+) Arthritis , Osteoarthritis,    Abdominal   Peds  Hematology negative hematology ROS (+)   Anesthesia Other Findings   Reproductive/Obstetrics negative OB ROS                           Anesthesia Physical Anesthesia Plan  ASA: 2  Anesthesia Plan: Spinal   Post-op Pain Management: Regional block and Tylenol PO (pre-op)   Induction: Intravenous  PONV Risk Score and Plan: 3 and Propofol infusion, Ondansetron and Treatment may vary due to age or medical condition  Airway Management Planned: Simple Face Mask and Natural Airway  Additional Equipment:   Intra-op Plan:   Post-operative Plan:   Informed Consent: I have reviewed the patients History and Physical, chart, labs and discussed the procedure including the risks, benefits and alternatives for the proposed anesthesia with the patient or authorized representative who has indicated his/her understanding and acceptance.     Dental advisory given  Plan Discussed with: CRNA  Anesthesia  Plan Comments: (See APP note by Durel Salts, FNP )      Anesthesia Quick Evaluation

## 2021-08-04 NOTE — Progress Notes (Signed)
Anesthesia Chart Review:   Case: [id number] Date/Time: 08/11/21 0715   Procedure: TOTAL KNEE ARTHROPLASTY (Left: Knee)   Anesthesia type: Choice   Pre-op diagnosis: OA LEFT KNEE   Location: [facility] ROOM 08 / [facility]   Surgeons: Renette Butters, MD       DISCUSSION: Pt is 70 years old with hx HTN, DM, COPD, hx alcohol abuse. Current smoker.   VS: BP (!) 160/87    Pulse 89    Temp 36.8 C (Oral)    Resp 20    Ht 5\' 5"  (1.651 m)    Wt 81.6 kg    SpO2 95%    BMI 29.95 kg/m   PROVIDERS: - PCP is Jonathon Jordan, MD  - Saw cardiologist Eleonore Chiquito, MD this past year for coronary calcifications on CT and DOE. PFTs ordered, show emphysema, pt referred to pulmonology. Echo normal. Pt cleared for surgery at acceptable risk by Sande Rives, PA on 06/24/21  - Pulmonologist is Chi Rodman Pickle, MD. Cleared pt for surgery at last office visit 07/02/21, noting:  "Overall, I recommend proceeding with the surgery if the risk for respiratory complications are outweighed by the potential benefits. This will need to be discussed between the patient and surgeon. To reduce risks of respiratory complications, I recommend: --Smoking cessation 6-8 weeks if possible --Pre- and post-operative incentive spirometry performed frequently while awake --Avoiding use of pancuronium during anesthesia    LABS: Labs reviewed: Acceptable for surgery. (all labs ordered are listed, but only abnormal results are displayed)  Labs Reviewed  COMPREHENSIVE METABOLIC PANEL - Abnormal; Notable for the following components:      Result Value   Glucose, Bld 104 (*)    All other components within normal limits  CBC - Abnormal; Notable for the following components:   WBC 11.5 (*)    MCV 100.7 (*)    All other components within normal limits  GLUCOSE, CAPILLARY - Abnormal; Notable for the following components:   Glucose-Capillary 110 (*)    All other components within normal limits  HEMOGLOBIN A1C - Abnormal; Notable for  the following components:   Hgb A1c MFr Bld 6.8 (*)    All other components within normal limits  SURGICAL PCR SCREEN     EKG 01/23/21: Sinus rhythm. Left axis deviation. Abnormal R-wave progression, late transition. Nonspecific T abnormalities, lateral leads   CV: Echo 11/26/20:  1. Left ventricular ejection fraction, by estimation, is 60 to 65%. The left ventricle has normal function. The left ventricle has no regional wall motion abnormalities. There is mild left ventricular hypertrophy. Left ventricular diastolic parameters  are consistent with Grade I diastolic dysfunction (impaired relaxation).  2. Right ventricular systolic function is normal. The right ventricular size is normal. Tricuspid regurgitation signal is inadequate for assessing PA pressure.  3. Left atrial size was mildly dilated by visual estimate.  4. The mitral valve is grossly normal. Trivial mitral valve  regurgitation. No evidence of mitral stenosis.  5. The aortic valve is grossly normal. There is mild calcification of the aortic valve. There is mild thickening of the aortic valve. Aortic valve regurgitation is trivial. No aortic stenosis is present.  6. The inferior vena cava is normal in size with greater than 50% respiratory variability, suggesting right atrial pressure of 3 mmHg.    Past Medical History:  Diagnosis Date   Alcohol abuse, in remission    Cancer of sigmoid colon ([place]) 07/2005   T2N0M0   Chronic bronchitis  Depression    Dyspnea    GERD (gastroesophageal reflux disease)    History of thyroid nodule    monitoring   Hyperlipidemia    Hypertension    Migraines    OA (osteoarthritis)    PONV (postoperative nausea and vomiting)    Reflux     Past Surgical History:  Procedure Laterality Date   ABDOMINAL EXPOSURE N/A 11/24/2016   Procedure: ABDOMINAL EXPOSURE;  Surgeon: Rosetta Posner, MD;  Location: [facility];  Service: Vascular;  Laterality: N/A;   ABDOMINAL HYSTERECTOMY  1992   age 65 and  appendics   ANTERIOR LUMBAR FUSION Bilateral 11/24/2016   Procedure: LUMBAR 5-SACRUM 1 ANTERIOR LUMBAR INTERBODY FUSION;  Surgeon: Phylliss Bob, MD;  Location: [facility];  Service: Orthopedics;  Laterality: Bilateral;  LUMBAR 5-SACRUM 1 ANTERIOR LUMBAR INTERBODY FUSION    CHOLECYSTECTOMY  2007   COLON RESECTION  2006   COLONOSCOPY  2019   HAND SURGERY  2010   repair of incisional hernia  2012   SHOULDER ARTHROSCOPY Left 2009   SHOULDER SURGERY Right 1997   rotator cuff   TONSILLECTOMY     as a child    MEDICATIONS:  amitriptyline (ELAVIL) 100 MG tablet   ARIPiprazole (ABILIFY) 15 MG tablet   calcium carbonate (TUMS - DOSED IN MG ELEMENTAL CALCIUM) 500 MG chewable tablet   Cholecalciferol (VITAMIN D) 2000 units CAPS   Cyanocobalamin (VITAMIN B-12) 5000 MCG TBDP   gabapentin (NEURONTIN) 100 MG capsule   losartan (COZAAR) 25 MG tablet   metFORMIN (GLUCOPHAGE-XR) 500 MG 24 hr tablet   PARoxetine (PAXIL) 20 MG tablet   rosuvastatin (CRESTOR) 20 MG tablet   No current facility-administered medications for this encounter.    If no changes, I anticipate pt can proceed with surgery as scheduled.   Willeen Cass, PhD, FNP-BC [facility] Stay [facility]/Anesthesiology Phone: [phone] 08/04/2021 2:41 PM

## 2021-08-04 NOTE — Care Plan (Signed)
Ortho Bundle Case Management Note  Patient Details  Name: [name] MRN: [id number] Date of Birth: [birth year]  Spoke with patient prior to surgery. She will discharge to home with family to assist. Rolling walker ordered for home use. OPPT set up with [facility]. Patient and MD in agreement with plan. Choice offered                     DME Arranged:  Walker rolling DME Agency:  [employer]  HH Arranged:    [place] Agency:     Additional Comments: Please contact me with any questions of if this plan should need to change.  Ladell Heads,  [facility]  [phone] 08/04/2021, 8:31 AM

## 2021-08-06 ENCOUNTER — Ambulatory Visit: Payer: Medicare Other

## 2021-08-06 ENCOUNTER — Other Ambulatory Visit: Payer: Self-pay

## 2021-08-06 DIAGNOSIS — R262 Difficulty in walking, not elsewhere classified: Secondary | ICD-10-CM | POA: Diagnosis not present

## 2021-08-06 DIAGNOSIS — M6281 Muscle weakness (generalized): Secondary | ICD-10-CM

## 2021-08-06 DIAGNOSIS — R2681 Unsteadiness on feet: Secondary | ICD-10-CM

## 2021-08-06 DIAGNOSIS — R42 Dizziness and giddiness: Secondary | ICD-10-CM | POA: Diagnosis not present

## 2021-08-06 DIAGNOSIS — R2689 Other abnormalities of gait and mobility: Secondary | ICD-10-CM

## 2021-08-06 NOTE — Therapy (Signed)
[facility] [address] [place], [place], [address] Phone: [phone]   Fax:  [phone]  Physical Therapy Treatment/Progress Note/Discharge Summary  Patient Details  Name: [name] MRN: [id number] Date of Birth: Apr 09, 1951 Referring Provider (PT): Alric Ran, MD  Physical Therapy Progress Note   Dates of Reporting Period:06/29/21 - 08/06/21   See Note below for Objective Data and Assessment of Progress/Goals.  Thank you for the referral of this patient. Guillermina City, PT, DPT  PHYSICAL THERAPY DISCHARGE SUMMARY  Visits from Start of Care: 10  Current functional level related to goals / functional outcomes: See Clinical Impression Statement   Remaining deficits: Pain, Abnormal Gait   Education / Equipment: HEP   Patient agrees to discharge. Patient goals were partially met. Patient is being discharged due to meeting the stated rehab goals.   Encounter Date: 08/06/2021   PT End of Session - 08/06/21 1559     Visit Number 10    Number of Visits 17    Date for PT Re-Evaluation 08/28/21    Authorization Type Medicare part A and part B/ BCBS    Progress Note Due on Visit 10    PT Start Time 1530    PT Stop Time 1600    PT Time Calculation (min) 30 min    Equipment Utilized During Treatment Gait belt    Activity Tolerance Patient tolerated treatment well    Behavior During Therapy WFL for tasks assessed/performed             Past Medical History:  Diagnosis Date   Alcohol abuse, in remission    Cancer of sigmoid colon ([place]) 07/2005   T2N0M0   Chronic bronchitis    COPD (chronic obstructive pulmonary disease) ([place])    Depression    Diabetes mellitus without complication ([facility])    Dyspnea    GERD (gastroesophageal reflux disease)    History of thyroid nodule    monitoring   Hyperlipidemia    Hypertension    Migraines    OA (osteoarthritis)    PONV (postoperative nausea and vomiting)     Reflux     Past Surgical History:  Procedure Laterality Date   ABDOMINAL EXPOSURE N/A 11/24/2016   Procedure: ABDOMINAL EXPOSURE;  Surgeon: Rosetta Posner, MD;  Location: [facility];  Service: Vascular;  Laterality: N/A;   ABDOMINAL HYSTERECTOMY  1992   age 71 and appendics   ANTERIOR LUMBAR FUSION Bilateral 11/24/2016   Procedure: LUMBAR 5-SACRUM 1 ANTERIOR LUMBAR INTERBODY FUSION;  Surgeon: Phylliss Bob, MD;  Location: [facility];  Service: Orthopedics;  Laterality: Bilateral;  LUMBAR 5-SACRUM 1 ANTERIOR LUMBAR INTERBODY FUSION    CHOLECYSTECTOMY  2007   COLON RESECTION  2006   COLONOSCOPY  2019   HAND SURGERY  2010   repair of incisional hernia  2012   SHOULDER ARTHROSCOPY Left 2009   SHOULDER SURGERY Right 1997   rotator cuff   TONSILLECTOMY     as a child    There were no vitals filed for this visit.   Subjective Assessment - 08/06/21 1533     Subjective Patient reports R knee still bothering, wish they would do the surgery on the R one.    Patient Stated Goals Improve balance to not fall    Currently in Pain? Yes    Pain Score 8     Pain Location Knee    Pain Orientation Right    Pain Descriptors / Indicators Aching;Throbbing  Pain Type Chronic pain    Pain Onset In the past 7 days                [facility] PT Assessment - 08/06/21 0001       Assessment   Medical Diagnosis Falls    Referring Provider (PT) Alric Ran, MD      Functional Gait  Assessment   Gait assessed  Yes    Gait Level Surface Walks 20 ft in less than 7 sec but greater than 5.5 sec, uses assistive device, slower speed, mild gait deviations, or deviates 6-10 in outside of the 12 in walkway width.    Change in Gait Speed Able to smoothly change walking speed without loss of balance or gait deviation. Deviate no more than 6 in outside of the 12 in walkway width.    Gait with Horizontal Head Turns Performs head turns smoothly with no change in gait. Deviates no more than 6 in outside 12 in walkway  width    Gait with Vertical Head Turns Performs task with slight change in gait velocity (eg, minor disruption to smooth gait path), deviates 6 - 10 in outside 12 in walkway width or uses assistive device    Gait and Pivot Turn Pivot turns safely within 3 sec and stops quickly with no loss of balance.    Step Over Obstacle Is able to step over 2 stacked shoe boxes taped together (9 in total height) without changing gait speed. No evidence of imbalance.    Gait with Narrow Base of Support Ambulates 4-7 steps.    Gait with Eyes Closed Walks 20 ft, slow speed, abnormal gait pattern, evidence for imbalance, deviates 10-15 in outside 12 in walkway width. Requires more than 9 sec to ambulate 20 ft.    Ambulating Backwards Walks 20 ft, uses assistive device, slower speed, mild gait deviations, deviates 6-10 in outside 12 in walkway width.    Steps Alternating feet, must use rail.    Total Score 22    FGA comment: 22/30              [facility] Adult PT Treatment/Exercise - 08/06/21 0001       Transfers   Transfers Sit to Stand;Stand to Sit    Sit to Stand 7: Independent    Stand to Sit 7: Independent      Ambulation/Gait   Ambulation/Gait Yes    Ambulation/Gait Assistance 6: Modified independent (Device/Increase time)    Ambulation/Gait Assistance Details ambulation outdoors unlevel surfaces x 1100 ft no imbalance noted, but still present with antaglic gait pattern due to knee pain.    Ambulation Distance (Feet) 1100 Feet    Assistive device None    Gait Pattern Step-to pattern;Antalgic    Ambulation Surface Unlevel;Outdoor;Paved;Grass      Neuro Re-ed    Neuro Re-ed Details  Completed M-CTSIB: able to complete situation 1-3 for full 30 seconds, situation 4: 25 seconds. mild sway with vision removed.            HEP:  Gaze Stabilization: Standing Feet Together    Feet together, keeping eyes on target on mirror 3-4 feet away, tilt head down 15-30 and move head side to side for 60  seconds. Repeat while moving head up and down for 60 seconds. Do 2-3 sessions per day. Copyright  VHI. All rights reserved.        Access Code: [email or web address] URL: [email or web address] Date: 07/24/2021 Prepared by: Baldomero Lamy   Exercises Seated Nose to  Left Knee Vestibular Habituation - 1 x daily - 7 x weekly - 3 sets - 5 reps Staggered Sit-to-Stand - 1 x daily - 7 x weekly - 4 sets - 5 reps Romberg Stance Eyes Closed on Foam Pad - 1 x daily - 7 x weekly - 3 sets - 30 hold Standing Feet Apart with Eyes Closed and Head Nods on Foam Pad - 1 x daily - 7 x weekly - 2 sets - 10 reps Standing Feet Apart with Eyes Closed and Head Rotation on Foam Pad - 1 x daily - 7 x weekly - 2 sets - 10 reps         PT Education - 08/06/21 1603     Education Details progress toward LTGs; HEP Compliance    Person(s) Educated Patient    Methods Explanation    Comprehension Verbalized understanding              PT Short Term Goals - 07/24/21 1156       PT SHORT TERM GOAL #1   Title Pt will be independent w/ initial HEP focused on balance.    Baseline reports independence with updated HEP    Time 4    Period Weeks    Status Achieved    Target Date 07/31/21      PT SHORT TERM GOAL #2   Title Pt will be able to perform 5x sit <> stand in </= 10 seconds to demonstrate a decreased fall risk.    Baseline 06/29/21- 12.18 sec w/o hands from standard chair; 11.16 secs w/o UE support    Time 4    Period Weeks    Status Partially Met    Target Date 07/31/21      PT SHORT TERM GOAL #3   Title Pt will be able to ambulate at >/= 2.62 ft/sec for improved community ambulation.    Baseline 06/29/21- 2.51 ft/sec w/o AD; 3.34 ft/sec w/o AD    Time 4    Period Weeks    Status Achieved    Target Date 07/31/21      PT SHORT TERM GOAL #4   Title Pt will be able to ambulate 500' over unlevel surfaces w/ LRAD vs no AD for improved functional mobility and community ambulation.    Time 4     Period Weeks    Status Partially Met    Target Date 07/31/21               PT Long Term Goals - 08/06/21 1544       PT LONG TERM GOAL #1   Title Pt will be independent w/ final balance focused HEP.    Baseline indepdent with HEP    Time 8    Period Weeks    Status Achieved    Target Date 08/28/21      PT LONG TERM GOAL #2   Title Pt will improve score to >/= 25/30 on FGA demonstrating a decreased fall risk.    Baseline 07/08/21-19/30; 22/30    Time 8    Period Weeks    Status Not Met    Target Date 08/28/21      PT LONG TERM GOAL #3   Title Pt will be able to go full 30 seconds on condition 4 on Modified CTSIB for improved balance.    Baseline 07/08/21/- Full 30 seconds conditions 1-3, 15 seconds on condition 4; 25 seconds on situation 4.    Time 8    Period  Weeks    Status Partially Met    Target Date 08/28/21      PT LONG TERM GOAL #4   Title Pt will be able to ambulate 1000' over varied surfaces w/ LRAD vs no AD for improved community ambulation.    Baseline 1100' outdoor unlevel surfaces    Time 8    Period Weeks    Status Achieved    Target Date 08/28/21                   Plan - 08/06/21 1604     Clinical Impression Statement Today's skilled PT session included assesment of patient's progress toward LTGs due to upcoming surgery. Patient able to meet LTG #1, 3 and 4. Patient made progress toward LTG #2 but not to goal level. Improved FGA from 19/30 to 22/30 demonstrating reduced fall risk. Patient able to demo improvements on M-CTSIB situation 4 with eyes closed. Reviewed HEP, educating on complaince and plan to complete after recovery from surgery. Pt agreeable to d/c.    Personal Factors and Comorbidities Comorbidity 3+    Comorbidities Past medical history of neuropathy, hypertension, hyperlipidemia, anxiety, depression, colon cancer treated with surgery, OA, hypothyroid, history of alcohol abuse but in remission.    Examination-Activity  Limitations Caring for Others;Locomotion Level;Stairs;Carry    Examination-Participation Restrictions Yard Work;Occupation;Community Activity;Cleaning    Stability/Clinical Decision Making Evolving/Moderate complexity    Rehab Potential Good    PT Frequency 2x / week   plus eval   PT Duration 8 weeks    PT Treatment/Interventions ADLs/Self Care Home Management;Aquatic Therapy;Moist Heat;Cryotherapy;Gait training;Functional mobility training;Therapeutic activities;Therapeutic exercise;Balance training;Manual techniques;Neuromuscular re-education;Passive range of motion;Vestibular;Patient/family education;Canalith Repostioning;Stair training;DME Instruction    Consulted and Agree with Plan of Care Patient             Patient will benefit from skilled therapeutic intervention in order to improve the following deficits and impairments:  Decreased balance, Abnormal gait, Dizziness, Decreased strength, Difficulty walking, Hypomobility, Impaired sensation, Decreased range of motion, Decreased endurance, Pain  Visit Diagnosis: Other abnormalities of gait and mobility  Unsteadiness on feet  Muscle weakness (generalized)  Difficulty in walking, not elsewhere classified     Problem List Patient Active Problem List   Diagnosis Date Noted   Centrilobular emphysema ([place]) 12/13/2020   Radiculopathy 11/24/2016   H/O colon cancer, stage I 01/19/2013   Nonspecific abnormal electrocardiogram (ECG) (EKG) 03/31/2011   Chest pain 03/31/2011   Hyperlipidemia 03/31/2011   Hypertension 03/31/2011   Tobacco abuse 03/31/2011    [name], PT, DPT 08/06/2021, 4:08 PM  [facility] [address] [place], [place], [address] Phone: [phone]   Fax:  [phone]  Name: [name] MRN: [id number] Date of Birth: 04/19/1951

## 2021-08-11 ENCOUNTER — Ambulatory Visit (HOSPITAL_COMMUNITY): Payer: Medicare Other

## 2021-08-11 ENCOUNTER — Ambulatory Visit (HOSPITAL_COMMUNITY): Payer: Medicare Other | Admitting: Emergency Medicine

## 2021-08-11 ENCOUNTER — Ambulatory Visit (HOSPITAL_COMMUNITY): Payer: Medicare Other | Admitting: Certified Registered Nurse Anesthetist

## 2021-08-11 ENCOUNTER — Ambulatory Visit (HOSPITAL_COMMUNITY)
Admission: RE | Admit: 2021-08-11 | Discharge: 2021-08-11 | Disposition: A | Discharge status: Home / Self Care | Payer: Medicare Other | Attending: Orthopedic Surgery | Admitting: Orthopedic Surgery

## 2021-08-11 ENCOUNTER — Ambulatory Visit: Payer: Medicare Other

## 2021-08-11 ENCOUNTER — Encounter (HOSPITAL_COMMUNITY)
Admission: RE | Disposition: A | Discharge status: Home / Self Care | Payer: Self-pay | Source: Home / Self Care | Attending: Orthopedic Surgery

## 2021-08-11 ENCOUNTER — Encounter (HOSPITAL_COMMUNITY): Payer: Self-pay | Admitting: Orthopedic Surgery

## 2021-08-11 DIAGNOSIS — K219 Gastro-esophageal reflux disease without esophagitis: Secondary | ICD-10-CM | POA: Diagnosis not present

## 2021-08-11 DIAGNOSIS — F32A Depression, unspecified: Secondary | ICD-10-CM | POA: Diagnosis not present

## 2021-08-11 DIAGNOSIS — Z7984 Long term (current) use of oral hypoglycemic drugs: Secondary | ICD-10-CM | POA: Diagnosis not present

## 2021-08-11 DIAGNOSIS — M170 Bilateral primary osteoarthritis of knee: Secondary | ICD-10-CM | POA: Insufficient documentation

## 2021-08-11 DIAGNOSIS — F17290 Nicotine dependence, other tobacco product, uncomplicated: Secondary | ICD-10-CM | POA: Diagnosis not present

## 2021-08-11 DIAGNOSIS — J449 Chronic obstructive pulmonary disease, unspecified: Secondary | ICD-10-CM | POA: Insufficient documentation

## 2021-08-11 DIAGNOSIS — M1712 Unilateral primary osteoarthritis, left knee: Secondary | ICD-10-CM | POA: Diagnosis not present

## 2021-08-11 DIAGNOSIS — M25762 Osteophyte, left knee: Secondary | ICD-10-CM | POA: Diagnosis not present

## 2021-08-11 DIAGNOSIS — E119 Type 2 diabetes mellitus without complications: Secondary | ICD-10-CM | POA: Insufficient documentation

## 2021-08-11 DIAGNOSIS — Z96652 Presence of left artificial knee joint: Secondary | ICD-10-CM | POA: Diagnosis not present

## 2021-08-11 DIAGNOSIS — I10 Essential (primary) hypertension: Secondary | ICD-10-CM | POA: Diagnosis not present

## 2021-08-11 DIAGNOSIS — G8918 Other acute postprocedural pain: Secondary | ICD-10-CM | POA: Diagnosis not present

## 2021-08-11 HISTORY — PX: TOTAL KNEE ARTHROPLASTY: SHX125

## 2021-08-11 LAB — GLUCOSE, CAPILLARY
Glucose-Capillary: 119 mg/dL — ABNORMAL HIGH (ref 70–99)
Glucose-Capillary: 147 mg/dL — ABNORMAL HIGH (ref 70–99)

## 2021-08-11 SURGERY — ARTHROPLASTY, KNEE, TOTAL
Anesthesia: Spinal | Site: Knee | Laterality: Left

## 2021-08-11 MED ORDER — BUPIVACAINE LIPOSOME 1.3 % IJ SUSP
INTRAMUSCULAR | Status: AC
  Filled 2021-08-11: qty 20

## 2021-08-11 MED ORDER — HYDROMORPHONE HCL 1 MG/ML IJ SOLN: 0.5000 mg | INTRAMUSCULAR | Status: DC | PRN

## 2021-08-11 MED ORDER — ACETAMINOPHEN 500 MG PO TABS
1000.0000 mg | ORAL_TABLET | Freq: Once | ORAL | Status: AC
  Administered 2021-08-11: 1000 mg via ORAL
  Filled 2021-08-11: qty 2

## 2021-08-11 MED ORDER — CHLORHEXIDINE GLUCONATE 0.12 % MT SOLN
15.0000 mL | Freq: Once | OROMUCOSAL | Status: AC
  Administered 2021-08-11: 15 mL via OROMUCOSAL

## 2021-08-11 MED ORDER — METHOCARBAMOL 500 MG PO TABS: 500.0000 mg | ORAL_TABLET | Freq: Four times a day (QID) | ORAL | Status: DC | PRN

## 2021-08-11 MED ORDER — POLYETHYLENE GLYCOL 3350 17 G PO PACK: 17.0000 g | PACK | Freq: Every day | ORAL | Status: DC | PRN

## 2021-08-11 MED ORDER — OXYCODONE HCL 5 MG PO TABS
5.0000 mg | ORAL_TABLET | Freq: Four times a day (QID) | ORAL | 0 refills | Status: DC | PRN
Start: 2021-08-11 — End: 2021-12-17

## 2021-08-11 MED ORDER — BUPIVACAINE-EPINEPHRINE (PF) 0.5% -1:200000 IJ SOLN
INTRAMUSCULAR | Status: DC | PRN
Start: 2021-08-11 — End: 2021-08-11
  Administered 2021-08-11: 20 mL via PERINEURAL

## 2021-08-11 MED ORDER — SODIUM CHLORIDE 0.9% FLUSH
INTRAVENOUS | Status: DC | PRN
  Administered 2021-08-11: 30 mL

## 2021-08-11 MED ORDER — ALUM & MAG HYDROXIDE-SIMETH 200-200-20 MG/5ML PO SUSP: 30.0000 mL | ORAL | Status: DC | PRN

## 2021-08-11 MED ORDER — ASPIRIN 81 MG PO CHEW: 81.0000 mg | CHEWABLE_TABLET | Freq: Two times a day (BID) | ORAL | Status: DC

## 2021-08-11 MED ORDER — CEFAZOLIN SODIUM-DEXTROSE 2-4 GM/100ML-% IV SOLN: 2.0000 g | Freq: Four times a day (QID) | INTRAVENOUS | Status: DC

## 2021-08-11 MED ORDER — ONDANSETRON HCL 4 MG/2ML IJ SOLN
INTRAMUSCULAR | Status: DC | PRN
  Administered 2021-08-11: 4 mg via INTRAVENOUS

## 2021-08-11 MED ORDER — TRANEXAMIC ACID-NACL 1000-0.7 MG/100ML-% IV SOLN
1000.0000 mg | INTRAVENOUS | Status: AC
  Administered 2021-08-11: 1000 mg via INTRAVENOUS
  Filled 2021-08-11: qty 100

## 2021-08-11 MED ORDER — PROPOFOL 10 MG/ML IV BOLUS
INTRAVENOUS | Status: DC | PRN
  Administered 2021-08-11: 20 mg via INTRAVENOUS

## 2021-08-11 MED ORDER — FENTANYL CITRATE (PF) 100 MCG/2ML IJ SOLN
INTRAMUSCULAR | Status: DC | PRN
  Administered 2021-08-11 (×2): 50 ug via INTRAVENOUS

## 2021-08-11 MED ORDER — ONDANSETRON HCL 4 MG/2ML IJ SOLN
INTRAMUSCULAR | Status: AC
  Filled 2021-08-11: qty 2

## 2021-08-11 MED ORDER — ONDANSETRON HCL 4 MG PO TABS: 4.0000 mg | ORAL_TABLET | Freq: Four times a day (QID) | ORAL | Status: DC | PRN

## 2021-08-11 MED ORDER — METHOCARBAMOL 750 MG PO TABS: 750.0000 mg | ORAL_TABLET | Freq: Three times a day (TID) | ORAL | 0 refills | Status: DC | PRN

## 2021-08-11 MED ORDER — DIPHENHYDRAMINE HCL 12.5 MG/5ML PO ELIX: 12.5000 mg | ORAL_SOLUTION | ORAL | Status: DC | PRN

## 2021-08-11 MED ORDER — WATER FOR IRRIGATION, STERILE IR SOLN
Status: DC | PRN
  Administered 2021-08-11: 2000 mL

## 2021-08-11 MED ORDER — POVIDONE-IODINE 10 % EX SWAB: 2.0000 "application " | Freq: Once | CUTANEOUS | Status: DC

## 2021-08-11 MED ORDER — PHENYLEPHRINE HCL-NACL 20-0.9 MG/250ML-% IV SOLN
INTRAVENOUS | Status: DC | PRN
  Administered 2021-08-11: 35 ug/min via INTRAVENOUS

## 2021-08-11 MED ORDER — MELOXICAM 15 MG PO TABS: 15.0000 mg | ORAL_TABLET | Freq: Every day | ORAL | 0 refills | Status: DC | PRN

## 2021-08-11 MED ORDER — OXYCODONE HCL 5 MG PO TABS: 5.0000 mg | ORAL_TABLET | ORAL | Status: DC | PRN

## 2021-08-11 MED ORDER — FENTANYL CITRATE (PF) 100 MCG/2ML IJ SOLN
INTRAMUSCULAR | Status: AC
  Filled 2021-08-11: qty 2

## 2021-08-11 MED ORDER — ACETAMINOPHEN 500 MG PO TABS: 1000.0000 mg | ORAL_TABLET | Freq: Four times a day (QID) | ORAL | Status: DC

## 2021-08-11 MED ORDER — PHENOL 1.4 % MT LIQD: 1.0000 | OROMUCOSAL | Status: DC | PRN

## 2021-08-11 MED ORDER — DEXAMETHASONE SODIUM PHOSPHATE 10 MG/ML IJ SOLN
8.0000 mg | Freq: Once | INTRAMUSCULAR | Status: AC
  Administered 2021-08-11: 8 mg via INTRAVENOUS

## 2021-08-11 MED ORDER — ONDANSETRON 4 MG PO TBDP: 4.0000 mg | ORAL_TABLET | Freq: Three times a day (TID) | ORAL | 0 refills | Status: DC | PRN

## 2021-08-11 MED ORDER — OMEPRAZOLE MAGNESIUM 20 MG PO TBEC: 20.0000 mg | DELAYED_RELEASE_TABLET | Freq: Every day | ORAL | 0 refills | Status: DC

## 2021-08-11 MED ORDER — BISACODYL 10 MG RE SUPP: 10.0000 mg | Freq: Every day | RECTAL | Status: DC | PRN

## 2021-08-11 MED ORDER — TRANEXAMIC ACID-NACL 1000-0.7 MG/100ML-% IV SOLN: 1000.0000 mg | Freq: Once | INTRAVENOUS | Status: DC

## 2021-08-11 MED ORDER — ACETAMINOPHEN 325 MG PO TABS: 325.0000 mg | ORAL_TABLET | Freq: Four times a day (QID) | ORAL | Status: DC | PRN

## 2021-08-11 MED ORDER — METOCLOPRAMIDE HCL 5 MG/ML IJ SOLN: 5.0000 mg | Freq: Three times a day (TID) | INTRAMUSCULAR | Status: DC | PRN

## 2021-08-11 MED ORDER — BUPIVACAINE IN DEXTROSE 0.75-8.25 % IT SOLN
INTRATHECAL | Status: DC | PRN
  Administered 2021-08-11: 1.6 mL via INTRATHECAL

## 2021-08-11 MED ORDER — TRAMADOL HCL 50 MG PO TABS: 50.0000 mg | ORAL_TABLET | Freq: Four times a day (QID) | ORAL | Status: DC

## 2021-08-11 MED ORDER — MENTHOL 3 MG MT LOZG: 1.0000 | LOZENGE | OROMUCOSAL | Status: DC | PRN

## 2021-08-11 MED ORDER — PROPOFOL 1000 MG/100ML IV EMUL
INTRAVENOUS | Status: AC
  Filled 2021-08-11: qty 100

## 2021-08-11 MED ORDER — LACTATED RINGERS IV BOLUS
250.0000 mL | Freq: Once | INTRAVENOUS | Status: AC
  Administered 2021-08-11: 250 mL via INTRAVENOUS

## 2021-08-11 MED ORDER — LIDOCAINE 2% (20 MG/ML) 5 ML SYRINGE
INTRAMUSCULAR | Status: DC | PRN
  Administered 2021-08-11: 40 mg via INTRAVENOUS

## 2021-08-11 MED ORDER — BUPIVACAINE-EPINEPHRINE (PF) 0.25% -1:200000 IJ SOLN
INTRAMUSCULAR | Status: AC
  Filled 2021-08-11: qty 30

## 2021-08-11 MED ORDER — ORAL CARE MOUTH RINSE: 15.0000 mL | Freq: Once | OROMUCOSAL | Status: AC

## 2021-08-11 MED ORDER — VANCOMYCIN HCL IN DEXTROSE 1-5 GM/200ML-% IV SOLN
1000.0000 mg | INTRAVENOUS | Status: AC
  Administered 2021-08-11: 1000 mg via INTRAVENOUS
  Filled 2021-08-11: qty 200

## 2021-08-11 MED ORDER — LACTATED RINGERS IV BOLUS
500.0000 mL | Freq: Once | INTRAVENOUS | Status: AC
  Administered 2021-08-11: 500 mL via INTRAVENOUS

## 2021-08-11 MED ORDER — ONDANSETRON HCL 4 MG/2ML IJ SOLN: 4.0000 mg | Freq: Four times a day (QID) | INTRAMUSCULAR | Status: DC | PRN

## 2021-08-11 MED ORDER — LACTATED RINGERS IV BOLUS: 250.0000 mL | Freq: Once | INTRAVENOUS | Status: DC

## 2021-08-11 MED ORDER — PANTOPRAZOLE SODIUM 40 MG PO TBEC: 40.0000 mg | DELAYED_RELEASE_TABLET | Freq: Every day | ORAL | Status: DC

## 2021-08-11 MED ORDER — SODIUM CHLORIDE (PF) 0.9 % IJ SOLN
INTRAMUSCULAR | Status: AC
  Filled 2021-08-11: qty 30

## 2021-08-11 MED ORDER — ASPIRIN EC 81 MG PO TBEC: 81.0000 mg | DELAYED_RELEASE_TABLET | Freq: Two times a day (BID) | ORAL | 0 refills | Status: DC

## 2021-08-11 MED ORDER — BUPIVACAINE-EPINEPHRINE 0.25% -1:200000 IJ SOLN
INTRAMUSCULAR | Status: DC | PRN
  Administered 2021-08-11: 30 mL

## 2021-08-11 MED ORDER — BUPIVACAINE LIPOSOME 1.3 % IJ SUSP: 20.0000 mL | Freq: Once | INTRAMUSCULAR | Status: DC

## 2021-08-11 MED ORDER — DOCUSATE SODIUM 100 MG PO CAPS: 100.0000 mg | ORAL_CAPSULE | Freq: Two times a day (BID) | ORAL | Status: DC

## 2021-08-11 MED ORDER — ACETAMINOPHEN 500 MG PO TABS: 1000.0000 mg | ORAL_TABLET | Freq: Four times a day (QID) | ORAL | 0 refills | Status: DC | PRN

## 2021-08-11 MED ORDER — DEXAMETHASONE SODIUM PHOSPHATE 10 MG/ML IJ SOLN: 10.0000 mg | Freq: Once | INTRAMUSCULAR | Status: DC

## 2021-08-11 MED ORDER — DEXAMETHASONE SODIUM PHOSPHATE 10 MG/ML IJ SOLN
INTRAMUSCULAR | Status: AC
  Filled 2021-08-11: qty 1

## 2021-08-11 MED ORDER — METOCLOPRAMIDE HCL 5 MG PO TABS: 5.0000 mg | ORAL_TABLET | Freq: Three times a day (TID) | ORAL | Status: DC | PRN

## 2021-08-11 MED ORDER — LACTATED RINGERS IV SOLN: INTRAVENOUS | Status: DC

## 2021-08-11 MED ORDER — PROPOFOL 500 MG/50ML IV EMUL
INTRAVENOUS | Status: DC | PRN
  Administered 2021-08-11: 100 ug/kg/min via INTRAVENOUS

## 2021-08-11 MED ORDER — HYDROMORPHONE HCL 1 MG/ML IJ SOLN: 0.2500 mg | INTRAMUSCULAR | Status: DC | PRN

## 2021-08-11 MED ORDER — SODIUM CHLORIDE 0.9 % IR SOLN
Status: DC | PRN
  Administered 2021-08-11: 1000 mL

## 2021-08-11 MED ORDER — PHENYLEPHRINE HCL-NACL 20-0.9 MG/250ML-% IV SOLN
INTRAVENOUS | Status: AC
  Filled 2021-08-11: qty 500

## 2021-08-11 MED ORDER — METHOCARBAMOL 500 MG IVPB - SIMPLE MED: 500.0000 mg | Freq: Four times a day (QID) | INTRAVENOUS | Status: DC | PRN

## 2021-08-11 MED ORDER — 0.9 % SODIUM CHLORIDE (POUR BTL) OPTIME
TOPICAL | Status: DC | PRN
  Administered 2021-08-11: 1000 mL

## 2021-08-11 MED ORDER — OXYCODONE HCL 5 MG PO TABS: 10.0000 mg | ORAL_TABLET | ORAL | Status: DC | PRN

## 2021-08-11 SURGICAL SUPPLY — 55 items
BAG COUNTER SPONGE SURGICOUNT (BAG) IMPLANT
BAG SPNG CNTER NS LX DISP (BAG)
BLADE HEX COATED 2.75 (ELECTRODE) ×3 IMPLANT
BLADE SAG 18X100X1.27 (BLADE) ×3 IMPLANT
BLADE SAGITTAL 25.0X1.37X90 (BLADE) ×3 IMPLANT
BLADE SURG 15 STRL LF DISP TIS (BLADE) ×2 IMPLANT
BLADE SURG 15 STRL SS (BLADE) ×2
BLADE SURG SZ10 CARB STEEL (BLADE) ×6 IMPLANT
BNDG CMPR MED 10X6 ELC LF (GAUZE/BANDAGES/DRESSINGS) ×1
BNDG ELASTIC 6X10 VLCR STRL LF (GAUZE/BANDAGES/DRESSINGS) ×3 IMPLANT
BOWL SMART MIX CTS (DISPOSABLE) IMPLANT
BSPLAT TIB 4 KN TRITANIUM (Knees) ×1 IMPLANT
CLSR STERI-STRIP ANTIMIC 1/2X4 (GAUZE/BANDAGES/DRESSINGS) ×3 IMPLANT
COVER SURGICAL LIGHT HANDLE (MISCELLANEOUS) ×3 IMPLANT
CUFF TOURN SGL QUICK 34 (TOURNIQUET CUFF) ×2
CUFF TRNQT CYL 34X4.125X (TOURNIQUET CUFF) ×2 IMPLANT
DECANTER SPIKE VIAL GLASS SM (MISCELLANEOUS) ×3 IMPLANT
DRAPE INCISE IOBAN 66X45 STRL (DRAPES) ×9 IMPLANT
DRAPE U-SHAPE 47X51 STRL (DRAPES) ×3 IMPLANT
DRSG MEPILEX BORDER 4X12 (GAUZE/BANDAGES/DRESSINGS) ×3 IMPLANT
DRSG MEPILEX BORDER 4X8 (GAUZE/BANDAGES/DRESSINGS) ×1 IMPLANT
DURAPREP 26ML APPLICATOR (WOUND CARE) ×6 IMPLANT
ELECT PENCIL ROCKER SW 15FT (MISCELLANEOUS) ×1 IMPLANT
FEMORAL RETAINING CRUC KNEE #4 (Knees) ×1 IMPLANT
GLOVE SRG 8 PF TXTR STRL LF DI (GLOVE) ×2 IMPLANT
GLOVE SURG ENC MOIS LTX SZ7.5 (GLOVE) ×3 IMPLANT
GLOVE SURG POLYISO LF SZ7.5 (GLOVE) ×3 IMPLANT
GLOVE SURG UNDER POLY LF SZ7.5 (GLOVE) ×3 IMPLANT
GLOVE SURG UNDER POLY LF SZ8 (GLOVE) ×2
GOWN STRL REUS W/TWL LRG LVL3 (GOWN DISPOSABLE) ×3 IMPLANT
GOWN STRL REUS W/TWL XL LVL3 (GOWN DISPOSABLE) ×3 IMPLANT
HANDPIECE INTERPULSE COAX TIP (DISPOSABLE) ×2
HOLDER FOLEY CATH W/STRAP (MISCELLANEOUS) IMPLANT
IMMOBILIZER KNEE 20 (SOFTGOODS) ×2
IMMOBILIZER KNEE 20 THIGH 36 (SOFTGOODS) IMPLANT
IMMOBILIZER KNEE 22 UNIV (SOFTGOODS) ×3 IMPLANT
INSERT TIB BEARING X3 SZ4 11 (Joint) ×1 IMPLANT
KNEE PATELLA ASYMMETRIC 9X29 (Knees) ×1 IMPLANT
KNEE TIBIAL COMP TRI SZ4 (Knees) ×1 IMPLANT
MANIFOLD NEPTUNE II (INSTRUMENTS) ×3 IMPLANT
NS IRRIG 1000ML POUR BTL (IV SOLUTION) ×3 IMPLANT
PACK TOTAL KNEE CUSTOM (KITS) ×3 IMPLANT
PROTECTOR NERVE ULNAR (MISCELLANEOUS) ×3 IMPLANT
SET HNDPC FAN SPRY TIP SCT (DISPOSABLE) ×2 IMPLANT
SPONGE T-LAP 18X18 ~~LOC~~+RFID (SPONGE) ×9 IMPLANT
STRIP CLOSURE SKIN 1/2X4 (GAUZE/BANDAGES/DRESSINGS) ×2 IMPLANT
SUT MNCRL AB 3-0 PS2 18 (SUTURE) ×3 IMPLANT
SUT VIC AB 0 CT1 36 (SUTURE) ×3 IMPLANT
SUT VIC AB 1 CT1 36 (SUTURE) ×6 IMPLANT
SUT VIC AB 2-0 CT1 27 (SUTURE) ×2
SUT VIC AB 2-0 CT1 TAPERPNT 27 (SUTURE) ×2 IMPLANT
TRAY FOLEY MTR SLVR 14FR STAT (SET/KITS/TRAYS/PACK) IMPLANT
TRAY FOLEY MTR SLVR 16FR STAT (SET/KITS/TRAYS/PACK) IMPLANT
TUBE SUCTION HIGH CAP CLEAR NV (SUCTIONS) ×3 IMPLANT
WRAP KNEE MAXI GEL POST OP (GAUZE/BANDAGES/DRESSINGS) ×3 IMPLANT

## 2021-08-11 NOTE — Transfer of Care (Signed)
Immediate Anesthesia Transfer of Care Note  Patient: [name]  Procedure(s) Performed: TOTAL KNEE ARTHROPLASTY (Left: Knee)  Patient Location: PACU  Anesthesia Type:Spinal  Level of Consciousness: awake, alert , oriented and patient cooperative  Airway & Oxygen Therapy: Patient Spontanous Breathing  Post-op Assessment: Report given to RN and Post -op Vital signs reviewed and stable  Post vital signs: Reviewed and stable  Last Vitals:  Vitals Value Taken Time  BP 145/83 08/11/21 0921  Temp    Pulse 98 08/11/21 0922  Resp 20 08/11/21 0922  SpO2 95 % 08/11/21 0922  Vitals shown include unvalidated device data.  Last Pain:  Vitals:   08/11/21 0546  TempSrc:   PainSc: 7       Patients Stated Pain Goal: 4 (94/70/76 1518)  Complications: No notable events documented.

## 2021-08-11 NOTE — Anesthesia Procedure Notes (Signed)
Anesthesia Regional Block: Adductor canal block   Pre-Anesthetic Checklist: , timeout performed,  Correct Patient, Correct Site, Correct Laterality,  Correct Procedure, Correct Position, site marked,  Risks and benefits discussed,  Pre-op evaluation,  At surgeon's request and post-op pain management  Laterality: Left  Prep: Maximum Sterile Barrier Precautions used, chloraprep       Needles:  Injection technique: Single-shot  Needle Type: Echogenic Stimulator Needle     Needle Length: 9cm  Needle Gauge: 21     Additional Needles:   Procedures:,,,, ultrasound used (permanent image in chart),,    Narrative:  Start time: 08/11/2021 6:56 AM End time: 08/11/2021 7:06 AM Injection made incrementally with aspirations every 5 mL.  Performed by: Personally  Anesthesiologist: Roderic Palau, MD

## 2021-08-11 NOTE — Anesthesia Postprocedure Evaluation (Incomplete)
Anesthesia Post Note  Patient: [name]  Procedure(s) Performed: TOTAL KNEE ARTHROPLASTY (Left: Knee)     Anesthesia Post Evaluation No notable events documented.  Last Vitals:  Vitals:   08/11/21 0527  BP: (!) 183/93  Pulse: 85  Resp: 16  Temp: 36.5 C  SpO2: 96%    Last Pain:  Vitals:   08/11/21 0546  TempSrc:   PainSc: [place]

## 2021-08-11 NOTE — Anesthesia Procedure Notes (Signed)
Spinal  Patient location during procedure: OR Start time: 08/11/2021 7:20 AM End time: 08/11/2021 7:23 AM Reason for block: surgical anesthesia Staffing Performed: resident/CRNA  Resident/CRNA: Claudia Desanctis, CRNA Preanesthetic Checklist Completed: patient identified, IV checked, site marked, risks and benefits discussed, surgical consent, monitors and equipment checked, pre-op evaluation and timeout performed Spinal Block Patient position: sitting Prep: DuraPrep Patient monitoring: heart rate, cardiac monitor, continuous pulse ox and blood pressure Approach: midline Location: L3-4 Injection technique: single-shot Needle Needle type: Pencan  Needle gauge: 24 G Needle length: 10 cm Needle insertion depth: 7 cm Assessment Sensory level: T4 Events: CSF return Additional Notes Exp dates checked, csf flow at beginning and end of injection.  Tolerated well

## 2021-08-11 NOTE — Op Note (Signed)
DATE OF SURGERY:  08/11/2021 TIME: 9:56 AM  PATIENT NAME:  [name]   AGE: 71 y.o.    PRE-OPERATIVE DIAGNOSIS:  OA LEFT KNEE  POST-OPERATIVE DIAGNOSIS:  Same  PROCEDURE:  Procedure(s): TOTAL KNEE ARTHROPLASTY   SURGEON:  Renette Butters, MD   ASSISTANT:  Aggie Moats, PA-C, he was present and scrubbed throughout the case, critical for completion in a timely fashion, and for retraction, instrumentation, and closure.    OPERATIVE IMPLANTS: [employer] Triathlon CR. Press fit knee  Femur size 4, Tibia size 4, Patella size 29 3-peg oval button, with a 11 mm polyethylene insert.   PREOPERATIVE INDICATIONS:  [name] is a 71 y.o. year old female with end stage bone on bone degenerative arthritis of the knee who failed conservative treatment, including injections, antiinflammatories, activity modification, and assistive devices, and had significant impairment of their activities of daily living, and elected for Total Knee Arthroplasty.   The risks, benefits, and alternatives were discussed at length including but not limited to the risks of infection, bleeding, nerve injury, stiffness, blood clots, the need for revision surgery, cardiopulmonary complications, among others, and they were willing to proceed.   OPERATIVE DESCRIPTION:  The patient was brought to the operative room and placed in a supine position.  General anesthesia was administered.  IV antibiotics were given.  The lower extremity was prepped and draped in the usual sterile fashion.  Time out was performed.  The leg was elevated and exsanguinated and the tourniquet was inflated.  Anterior approach was performed.  The patella was everted and osteophytes were removed.  The anterior horn of the medial and lateral meniscus was removed.   The distal femur was opened with the drill and the intramedullary distal femoral cutting jig was utilized, set at 5 degrees resecting 8 mm off the distal femur.  Care was taken to  protect the collateral ligaments.  The distal femoral sizing jig was applied, taking care to avoid notching.  Then the 4-in-1 cutting jig was applied and the anterior and posterior femur was cut, along with the chamfer cuts.  All posterior osteophytes were removed.  The flexion gap was then measured and was symmetric with the extension gap.  Then the extramedullary tibial cutting jig was utilized making the appropriate cut using the anterior tibial crest as a reference building in appropriate posterior slope.  Care was taken during the cut to protect the medial and collateral ligaments.  The proximal tibia was removed along with the posterior horns of the menisci.  The PCL was sacrificed.    The extensor gap was measured and was approximately 51mm.    I completed the distal femoral preparation using the appropriate jig to prepare the box.  The patella was then measured, and cut with the saw.    The proximal tibia sized and prepared accordingly with the reamer and the punch, and then all components were trialed with the above sized poly insert.  The knee was found to have excellent balance and full motion.    The above named components were then impacted into place and Poly tibial piece and patella were inserted.  I was very happy with his stability and ROM  I performed a periarticular injection with marcaine and toradol  The knee was easily taken through a range of motion and the patella tracked well and the knee irrigated copiously and the parapatellar and subcutaneous tissue closed with vicryl, and monocryl with steri strips for the skin.  The  incision was dressed with sterile gauze and the tourniquet released and the patient was awakened and returned to the PACU in stable and satisfactory condition.  There were no complications.  Total tourniquet time was roughly 60 minutes.   POSTOPERATIVE PLAN: post op Abx, DVT px: SCD's, TED's, Early ambulation and chemical px

## 2021-08-11 NOTE — Discharge Instructions (Signed)

## 2021-08-11 NOTE — Evaluation (Signed)
Physical Therapy Evaluation Patient Details Name: [name] MRN: [id number] DOB: 01/28/1951 Today's Date: 08/11/2021  History of Present Illness  Pt is 71 yo female admitted on 08/11/21 for L TKA.  Pt with hx including but not limited to COPD, DM, depression, HTN, OA.  Clinical Impression  Pt is s/p TKA resulting in the deficits listed below (see PT Problem List). At baseline, pt is independent at baseline.  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  Pt seen in PACU for possible same day discharge. Pt was able to ambulate 100' RW and supervision transfers.  She performed stairs similar to home.  Pt with excellent pain control, quad activation (able to SLR no lag) , and ROM 0 to 80 degrees.  Pt expected to progress well.  Pt demonstrates safe gait & transfers in order to return home from PT perspective once discharged by MD.  While in hospital, will continue to benefit from PT for skilled therapy to advance mobility and exercises.  Of note , pt reports issues with transportation.  Her housemate can drive, but pt typically has to push her out on rollator and then load rollator.  Pt will be unable to do this.  Could possibly benefit from HHPT due to transportation issues - notified PA who reports will discuss with office case manager.         Recommendations for follow up therapy are one component of a multi-disciplinary discharge planning process, led by the attending physician.  Recommendations may be updated based on patient status, additional functional criteria and insurance authorization.  Follow Up Recommendations Follow physician's recommendations for discharge plan and follow up therapies (Pt did report issues with transportation - notified PA)    Assistance Recommended at Discharge Intermittent Supervision/Assistance  Patient can return home with the following  A little help with walking and/or transfers;A little help with bathing/dressing/bathroom;Assistance with  cooking/housework    Equipment Recommendations Rolling walker (2 wheels)  Recommendations for Other Services       Functional Status Assessment Patient has had a recent decline in their functional status and demonstrates the ability to make significant improvements in function in a reasonable and predictable amount of time.     Precautions / Restrictions Precautions Precautions: Fall Required Braces or Orthoses: Knee Immobilizer - Left Knee Immobilizer - Left: Discontinue once straight leg raise with < 10 degree lag Restrictions Weight Bearing Restrictions: Yes [name] Weight Bearing: Weight bearing as tolerated      Mobility  Bed Mobility Overal bed mobility: Needs Assistance Bed Mobility: Supine to Sit     Supine to sit: Supervision          Transfers Overall transfer level: Needs assistance Equipment used: Rolling walker (2 wheels) Transfers: Sit to/from Stand Sit to Stand: Min guard           General transfer comment: min guard for safety with cues for L LE managmeent; progressed to supervision    Ambulation/Gait Ambulation/Gait assistance: Min guard;Supervision Gait Distance (Feet): 100 Feet Assistive device: Rolling walker (2 wheels) Gait Pattern/deviations: Step-through pattern;Decreased stride length Gait velocity: decreased     General Gait Details: Cues for RW use; min guard progressed to supervision  Stairs Stairs: Yes Stairs assistance: Min guard Stair Management: Two rails;Step to pattern;Forwards;With walker Number of Stairs: 5 General stair comments: Up 4 with bil rails and 1 with RW.  Cued for sequencing; min guard for safety; performed easily  Wheelchair Mobility    Modified Rankin (  Stroke Patients Only)       Balance Overall balance assessment: Needs assistance Sitting-balance support: No upper extremity supported Sitting balance-Leahy Scale: Good     Standing balance support: Bilateral upper extremity supported;No upper  extremity supported Standing balance-Leahy Scale: Fair Standing balance comment: RW to ambulate but could do toileting ADLs without UE support                             Pertinent Vitals/Pain Pain Assessment: No/denies pain    Home Living Family/patient expects to be discharged to:: Private residence Living Arrangements: Spouse/significant other (live with sig other but pt is the caregiver (has MS and uses rollator)) Available Help at Discharge: Neighbor;Available PRN/intermittently Type of Home: House Home Access: Stairs to enter Entrance Stairs-Rails: None Entrance Stairs-Number of Steps: 1 step   Home Layout: Two level;Able to live on main level with bedroom/bathroom Home Equipment: Shower seat;Cane - single point      Prior Function Prior Level of Function : Independent/Modified Independent;Driving;Working/employed             Mobility Comments: Could ambulate in community without AD; owns pet care company ADLs Comments: Independent with ADLs and IADLS     Hand Dominance        Extremity/Trunk Assessment   Upper Extremity Assessment Upper Extremity Assessment: Overall WFL for tasks assessed    Lower Extremity Assessment Lower Extremity Assessment: [name] deficits/detail;RLE deficits/detail RLE Deficits / Details: Overall WFL but does report R knee "is in bad shape and needs TKA";  MMT 5/5 throughout RLE Sensation: decreased light touch (Reports bottom of foot still feels slightly numb but was able to identify all light touch locations) [name] Deficits / Details: Expected post op changes; ROM : ankle and hip WFL, knee 0 to 80 degrees; MMT: ankle 5/5, knee 3/5, hip 3/5 [name] Sensation:  (Reports bottom of foot still feels slightly numb but was able to identify all light touch locations)    Cervical / Trunk Assessment Cervical / Trunk Assessment: Normal  Communication   Communication: No difficulties  Cognition Arousal/Alertness: Awake/alert Behavior  During Therapy: WFL for tasks assessed/performed Overall Cognitive Status: Within Functional Limits for tasks assessed                                          General Comments      Exercises Total Joint Exercises Ankle Circles/Pumps: AROM;Both;10 reps;Supine Quad Sets: AROM;Both;10 reps;Supine Heel Slides: AAROM;Left;5 reps;Supine Hip ABduction/ADduction: AAROM;Left;5 reps;Supine Long Arc Quad: AROM;Left;5 reps;Seated Knee Flexion: AROM;Left;5 reps;Seated Goniometric ROM: L knee 0 to 80 degrees   Assessment/Plan    PT Assessment Patient needs continued PT services  PT Problem List Decreased strength;Decreased mobility;Decreased safety awareness;Decreased range of motion;Decreased activity tolerance;Decreased balance;Decreased knowledge of use of DME       PT Treatment Interventions DME instruction;Therapeutic activities;Modalities;Gait training;Therapeutic exercise;Patient/family education;Balance training;Stair training;Functional mobility training    PT Goals (Current goals can be found in the Care Plan section)  Acute Rehab PT Goals Patient Stated Goal: return home PT Goal Formulation: With patient Time For Goal Achievement: 08/25/21 Potential to Achieve Goals: Good    Frequency 7X/week     Co-evaluation               AM-PAC PT "6 Clicks" Mobility  Outcome Measure Help needed turning from your back to your side  while in a flat bed without using bedrails?: None Help needed moving from lying on your back to sitting on the side of a flat bed without using bedrails?: A Little Help needed moving to and from a bed to a chair (including a wheelchair)?: A Little Help needed standing up from a chair using your arms (e.g., wheelchair or bedside chair)?: A Little Help needed to walk in hospital room?: A Little Help needed climbing 3-5 steps with a railing? : A Little 6 Click Score: 19    End of Session Equipment Utilized During Treatment: Gait  belt Activity Tolerance: Patient tolerated treatment well Patient left: with call bell/phone within reach;in chair Nurse Communication: Mobility status PT Visit Diagnosis: Other abnormalities of gait and mobility (R26.89);Muscle weakness (generalized) (M62.81)    Time: 0223-3612 PT Time Calculation (min) (ACUTE ONLY): 43 min   Charges:   PT Evaluation $PT Eval Low Complexity: 1 Low PT Treatments $Gait Training: 8-22 mins $Therapeutic Exercise: 8-22 mins        Abran Richard, PT Acute Rehab Services Pager [phone] [facility] 08/11/2021, 1:50 PM

## 2021-08-11 NOTE — Anesthesia Postprocedure Evaluation (Signed)
Anesthesia Post Note  Patient: [name]  Procedure(s) Performed: TOTAL KNEE ARTHROPLASTY (Left: Knee)     Patient location during evaluation: PACU Anesthesia Type: Spinal and Regional Level of consciousness: oriented and awake and alert Pain management: pain level controlled Vital Signs Assessment: post-procedure vital signs reviewed and stable Respiratory status: spontaneous breathing and respiratory function stable Cardiovascular status: blood pressure returned to baseline and stable Postop Assessment: no headache, no backache, no apparent nausea or vomiting, spinal receding and patient able to bend at knees Anesthetic complications: no   No notable events documented.  Last Vitals:  Vitals:   08/11/21 1015 08/11/21 1030  BP: (!) 159/86 (!) 156/88  Pulse: 83 80  Resp: (!) 30 18  Temp:  (!) 36.3 C  SpO2: 90% 99%    Last Pain:  Vitals:   08/11/21 1030  TempSrc:   PainSc: 0-No pain                 Sherie Dobrowolski,W. EDMOND

## 2021-08-11 NOTE — Interval H&P Note (Signed)
History and Physical Interval Note:  08/11/2021 6:41 AM  [name]  has presented today for surgery, with the diagnosis of OA LEFT KNEE.  The various methods of treatment have been discussed with the patient and family. After consideration of risks, benefits and other options for treatment, the patient has consented to  Procedure(s): TOTAL KNEE ARTHROPLASTY (Left) as a surgical intervention.  The patient's history has been reviewed, patient examined, no change in status, stable for surgery.  I have reviewed the patient's chart and labs.  Questions were answered to the patient's satisfaction.     Renette Butters

## 2021-08-12 ENCOUNTER — Encounter (HOSPITAL_COMMUNITY): Payer: Self-pay | Admitting: Orthopedic Surgery

## 2021-08-12 DIAGNOSIS — Z96652 Presence of left artificial knee joint: Secondary | ICD-10-CM | POA: Diagnosis not present

## 2021-08-12 DIAGNOSIS — M1712 Unilateral primary osteoarthritis, left knee: Secondary | ICD-10-CM | POA: Diagnosis not present

## 2021-08-13 ENCOUNTER — Ambulatory Visit: Payer: Medicare Other

## 2021-08-14 DIAGNOSIS — F17210 Nicotine dependence, cigarettes, uncomplicated: Secondary | ICD-10-CM | POA: Diagnosis not present

## 2021-08-14 DIAGNOSIS — E785 Hyperlipidemia, unspecified: Secondary | ICD-10-CM | POA: Diagnosis not present

## 2021-08-14 DIAGNOSIS — Z471 Aftercare following joint replacement surgery: Secondary | ICD-10-CM | POA: Diagnosis not present

## 2021-08-14 DIAGNOSIS — K219 Gastro-esophageal reflux disease without esophagitis: Secondary | ICD-10-CM | POA: Diagnosis not present

## 2021-08-14 DIAGNOSIS — E041 Nontoxic single thyroid nodule: Secondary | ICD-10-CM | POA: Diagnosis not present

## 2021-08-14 DIAGNOSIS — G43909 Migraine, unspecified, not intractable, without status migrainosus: Secondary | ICD-10-CM | POA: Diagnosis not present

## 2021-08-14 DIAGNOSIS — J449 Chronic obstructive pulmonary disease, unspecified: Secondary | ICD-10-CM | POA: Diagnosis not present

## 2021-08-14 DIAGNOSIS — F1011 Alcohol abuse, in remission: Secondary | ICD-10-CM | POA: Diagnosis not present

## 2021-08-14 DIAGNOSIS — Z85038 Personal history of other malignant neoplasm of large intestine: Secondary | ICD-10-CM | POA: Diagnosis not present

## 2021-08-14 DIAGNOSIS — F32A Depression, unspecified: Secondary | ICD-10-CM | POA: Diagnosis not present

## 2021-08-14 DIAGNOSIS — E119 Type 2 diabetes mellitus without complications: Secondary | ICD-10-CM | POA: Diagnosis not present

## 2021-08-14 DIAGNOSIS — Z7984 Long term (current) use of oral hypoglycemic drugs: Secondary | ICD-10-CM | POA: Diagnosis not present

## 2021-08-14 DIAGNOSIS — M1711 Unilateral primary osteoarthritis, right knee: Secondary | ICD-10-CM | POA: Diagnosis not present

## 2021-08-14 DIAGNOSIS — Z7982 Long term (current) use of aspirin: Secondary | ICD-10-CM | POA: Diagnosis not present

## 2021-08-14 DIAGNOSIS — Z96652 Presence of left artificial knee joint: Secondary | ICD-10-CM | POA: Diagnosis not present

## 2021-08-14 DIAGNOSIS — I10 Essential (primary) hypertension: Secondary | ICD-10-CM | POA: Diagnosis not present

## 2021-08-18 ENCOUNTER — Ambulatory Visit: Payer: Medicare Other

## 2021-08-20 ENCOUNTER — Ambulatory Visit: Payer: Medicare Other

## 2021-08-26 DIAGNOSIS — M1712 Unilateral primary osteoarthritis, left knee: Secondary | ICD-10-CM | POA: Diagnosis not present

## 2021-08-31 DIAGNOSIS — M6281 Muscle weakness (generalized): Secondary | ICD-10-CM | POA: Diagnosis not present

## 2021-08-31 DIAGNOSIS — M25562 Pain in left knee: Secondary | ICD-10-CM | POA: Diagnosis not present

## 2021-08-31 DIAGNOSIS — M1712 Unilateral primary osteoarthritis, left knee: Secondary | ICD-10-CM | POA: Diagnosis not present

## 2021-08-31 DIAGNOSIS — M25662 Stiffness of left knee, not elsewhere classified: Secondary | ICD-10-CM | POA: Diagnosis not present

## 2021-09-03 DIAGNOSIS — M25662 Stiffness of left knee, not elsewhere classified: Secondary | ICD-10-CM | POA: Diagnosis not present

## 2021-09-03 DIAGNOSIS — M6281 Muscle weakness (generalized): Secondary | ICD-10-CM | POA: Diagnosis not present

## 2021-09-03 DIAGNOSIS — M1712 Unilateral primary osteoarthritis, left knee: Secondary | ICD-10-CM | POA: Diagnosis not present

## 2021-09-03 DIAGNOSIS — M25562 Pain in left knee: Secondary | ICD-10-CM | POA: Diagnosis not present

## 2021-09-07 DIAGNOSIS — M6281 Muscle weakness (generalized): Secondary | ICD-10-CM | POA: Diagnosis not present

## 2021-09-07 DIAGNOSIS — M25662 Stiffness of left knee, not elsewhere classified: Secondary | ICD-10-CM | POA: Diagnosis not present

## 2021-09-07 DIAGNOSIS — M1712 Unilateral primary osteoarthritis, left knee: Secondary | ICD-10-CM | POA: Diagnosis not present

## 2021-09-07 DIAGNOSIS — M25562 Pain in left knee: Secondary | ICD-10-CM | POA: Diagnosis not present

## 2021-09-10 DIAGNOSIS — M1712 Unilateral primary osteoarthritis, left knee: Secondary | ICD-10-CM | POA: Diagnosis not present

## 2021-09-10 DIAGNOSIS — M25662 Stiffness of left knee, not elsewhere classified: Secondary | ICD-10-CM | POA: Diagnosis not present

## 2021-09-10 DIAGNOSIS — M25562 Pain in left knee: Secondary | ICD-10-CM | POA: Diagnosis not present

## 2021-09-10 DIAGNOSIS — M6281 Muscle weakness (generalized): Secondary | ICD-10-CM | POA: Diagnosis not present

## 2021-09-14 DIAGNOSIS — M25562 Pain in left knee: Secondary | ICD-10-CM | POA: Diagnosis not present

## 2021-09-14 DIAGNOSIS — M25662 Stiffness of left knee, not elsewhere classified: Secondary | ICD-10-CM | POA: Diagnosis not present

## 2021-09-14 DIAGNOSIS — M1712 Unilateral primary osteoarthritis, left knee: Secondary | ICD-10-CM | POA: Diagnosis not present

## 2021-09-14 DIAGNOSIS — M6281 Muscle weakness (generalized): Secondary | ICD-10-CM | POA: Diagnosis not present

## 2021-09-17 DIAGNOSIS — M6281 Muscle weakness (generalized): Secondary | ICD-10-CM | POA: Diagnosis not present

## 2021-09-17 DIAGNOSIS — M25562 Pain in left knee: Secondary | ICD-10-CM | POA: Diagnosis not present

## 2021-09-17 DIAGNOSIS — M25662 Stiffness of left knee, not elsewhere classified: Secondary | ICD-10-CM | POA: Diagnosis not present

## 2021-09-17 DIAGNOSIS — M1712 Unilateral primary osteoarthritis, left knee: Secondary | ICD-10-CM | POA: Diagnosis not present

## 2021-09-22 DIAGNOSIS — M1712 Unilateral primary osteoarthritis, left knee: Secondary | ICD-10-CM | POA: Diagnosis not present

## 2021-09-22 DIAGNOSIS — M25662 Stiffness of left knee, not elsewhere classified: Secondary | ICD-10-CM | POA: Diagnosis not present

## 2021-09-22 DIAGNOSIS — M6281 Muscle weakness (generalized): Secondary | ICD-10-CM | POA: Diagnosis not present

## 2021-09-22 DIAGNOSIS — M25562 Pain in left knee: Secondary | ICD-10-CM | POA: Diagnosis not present

## 2021-09-24 DIAGNOSIS — M6281 Muscle weakness (generalized): Secondary | ICD-10-CM | POA: Diagnosis not present

## 2021-09-24 DIAGNOSIS — M25662 Stiffness of left knee, not elsewhere classified: Secondary | ICD-10-CM | POA: Diagnosis not present

## 2021-09-24 DIAGNOSIS — M25562 Pain in left knee: Secondary | ICD-10-CM | POA: Diagnosis not present

## 2021-09-24 DIAGNOSIS — M1712 Unilateral primary osteoarthritis, left knee: Secondary | ICD-10-CM | POA: Diagnosis not present

## 2021-09-28 DIAGNOSIS — M6281 Muscle weakness (generalized): Secondary | ICD-10-CM | POA: Diagnosis not present

## 2021-09-28 DIAGNOSIS — M1712 Unilateral primary osteoarthritis, left knee: Secondary | ICD-10-CM | POA: Diagnosis not present

## 2021-09-28 DIAGNOSIS — M25662 Stiffness of left knee, not elsewhere classified: Secondary | ICD-10-CM | POA: Diagnosis not present

## 2021-09-28 DIAGNOSIS — M25562 Pain in left knee: Secondary | ICD-10-CM | POA: Diagnosis not present

## 2021-10-01 DIAGNOSIS — M25562 Pain in left knee: Secondary | ICD-10-CM | POA: Diagnosis not present

## 2021-10-01 DIAGNOSIS — M6281 Muscle weakness (generalized): Secondary | ICD-10-CM | POA: Diagnosis not present

## 2021-10-01 DIAGNOSIS — M25662 Stiffness of left knee, not elsewhere classified: Secondary | ICD-10-CM | POA: Diagnosis not present

## 2021-10-01 DIAGNOSIS — M1712 Unilateral primary osteoarthritis, left knee: Secondary | ICD-10-CM | POA: Diagnosis not present

## 2021-10-02 DIAGNOSIS — M1712 Unilateral primary osteoarthritis, left knee: Secondary | ICD-10-CM | POA: Diagnosis not present

## 2021-10-03 IMAGING — CT CT MAXILLOFACIAL W/O CM
3 of 5 series · 15 of 47 positions shown, 18 images · non-contrast
Comparison: None.

CLINICAL DATA: Mechanical fall tonight.

EXAM:
CT HEAD WITHOUT CONTRAST
CT MAXILLOFACIAL WITHOUT CONTRAST
CT CERVICAL SPINE WITHOUT CONTRAST
TECHNIQUE: Multidetector CT imaging of the head, cervical spine, and
maxillofacial structures were performed using the standard protocol
without intravenous contrast. Multiplanar CT image reconstructions
of the cervical spine and maxillofacial structures were also
generated.

[Series 6: 1.0 thins bone · axial · 0.33mm/px · z∈[-253,-127]mm · 9 of 156 slices shown, 12 images]
[im 15/156  brain]
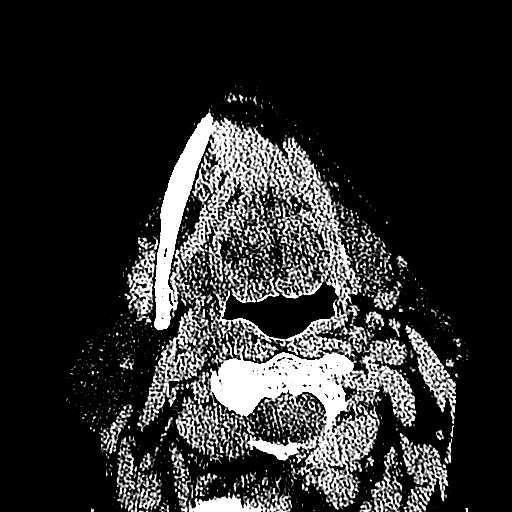
[im 15/156  bone]
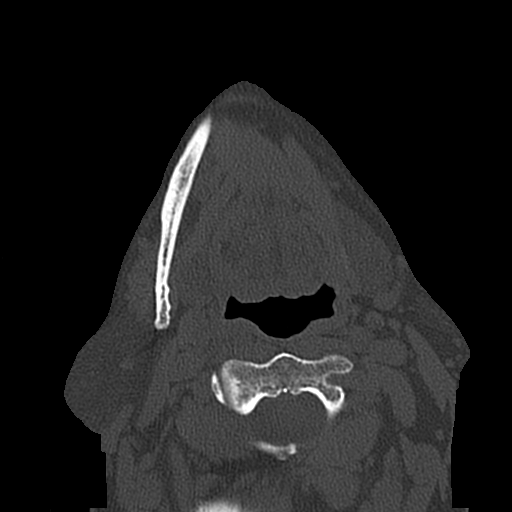
[im 29/156  bone]
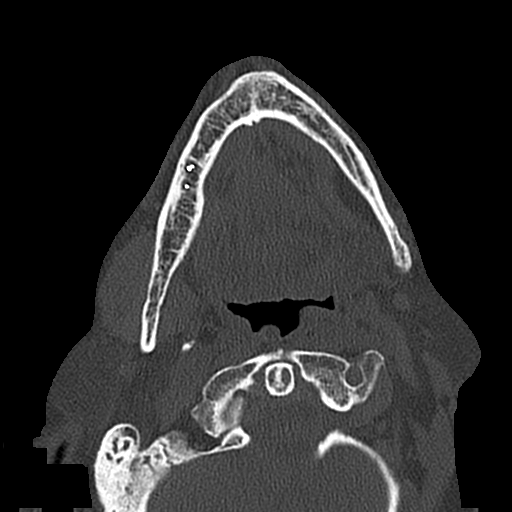
[im 43/156  bone]
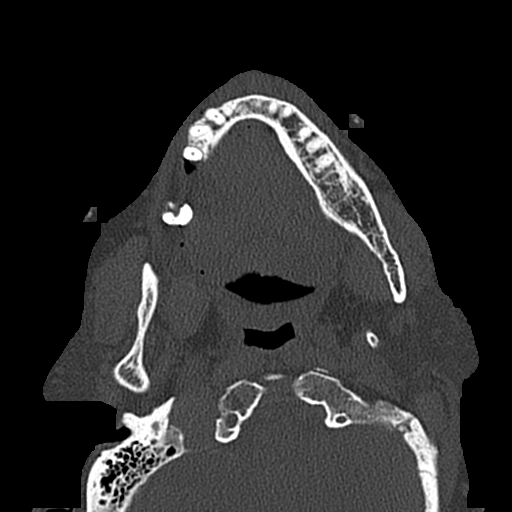
[im 57/156  bone]
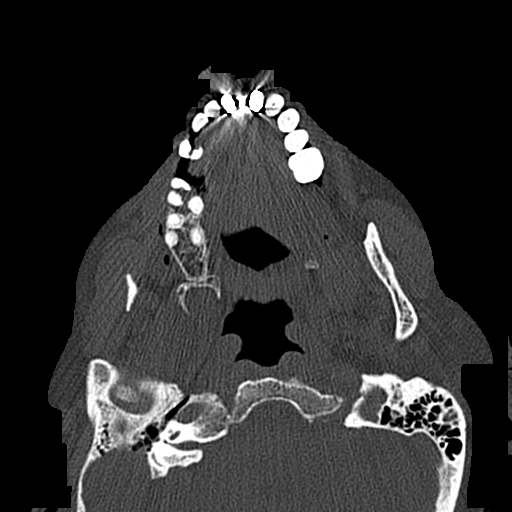
[im 85/156  brain]
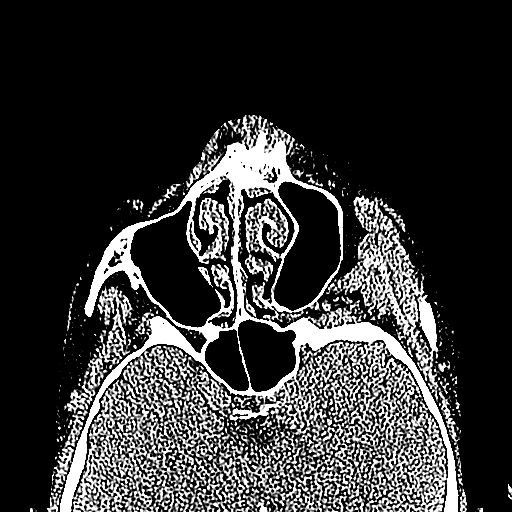
[im 85/156  bone]
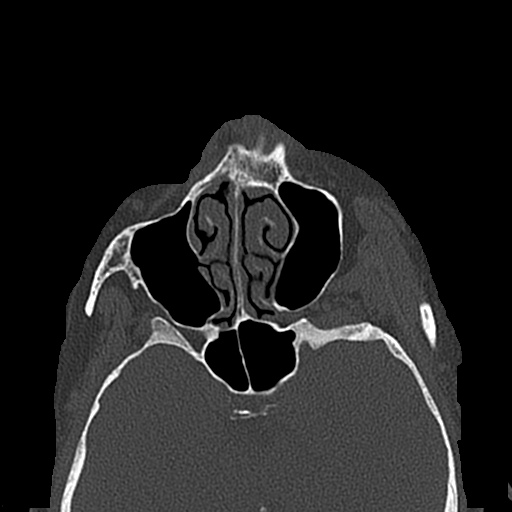
[im 99/156  bone]
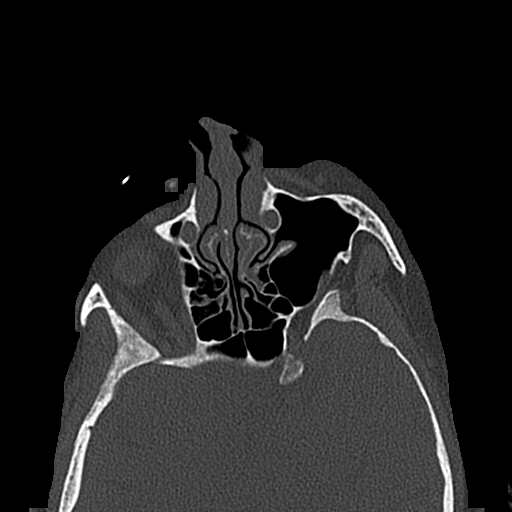
[im 113/156  bone]
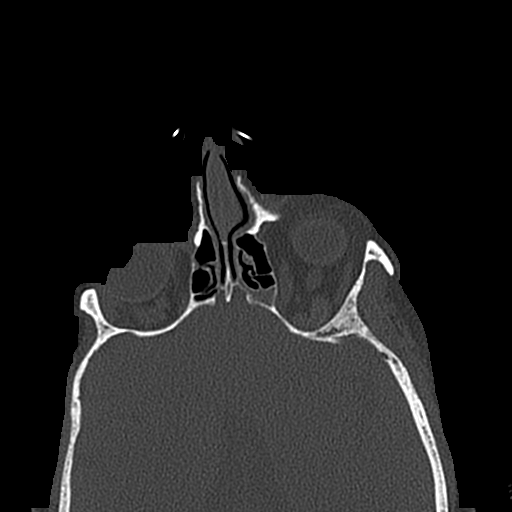
[im 127/156  bone]
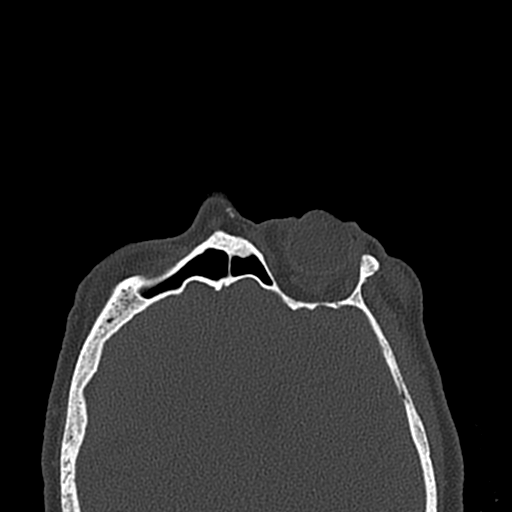
[im 141/156  brain]
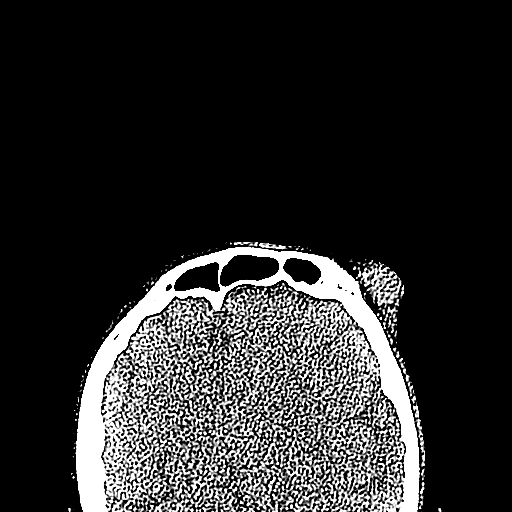
[im 141/156  bone]
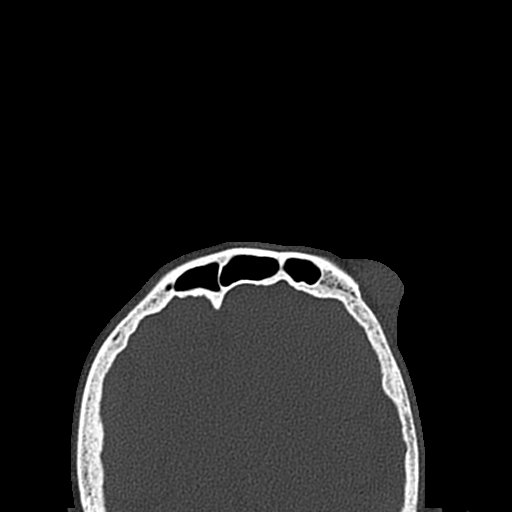

[Series 7: coronal soft tissue · coronal · 0.34mm/px · 3 of 76 slices shown]
[im 26/76  bone]
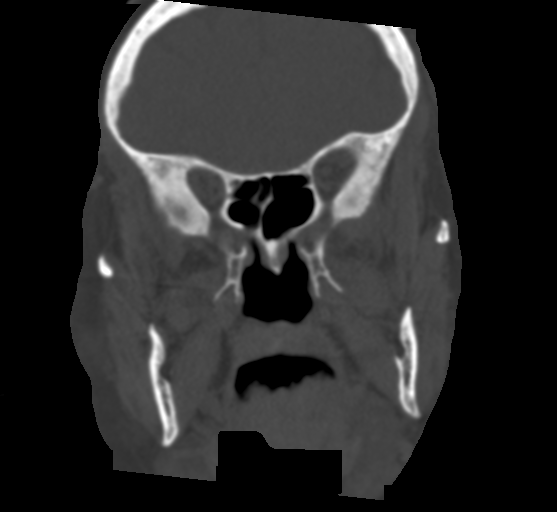
[im 34/76  bone]
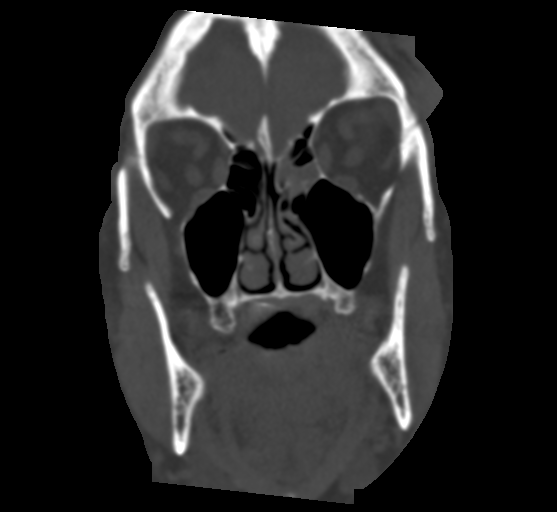
[im 42/76  bone]
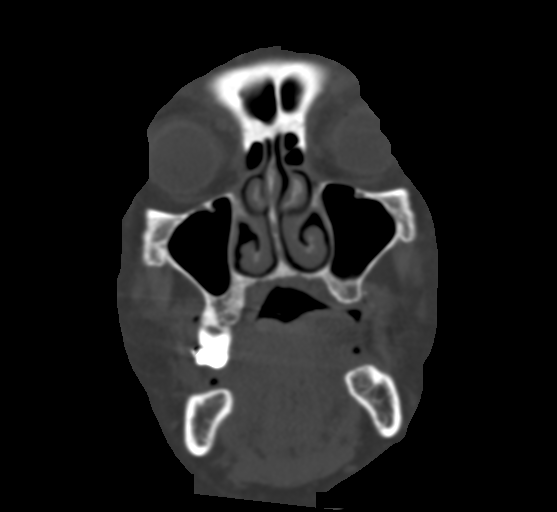

[Series 8: sagittal soft tissue · sagittal · 0.33mm/px · 3 of 85 slices shown]
[im 29/85  bone]
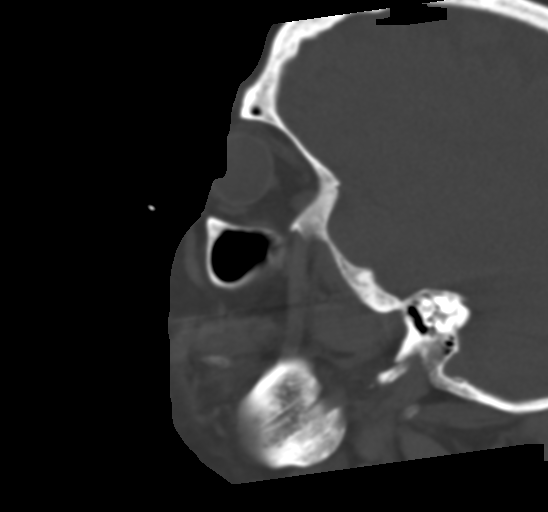
[im 43/85  bone]
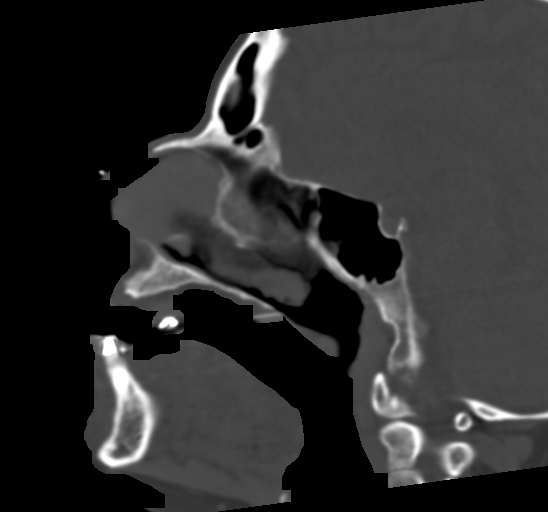
[im 57/85  bone]
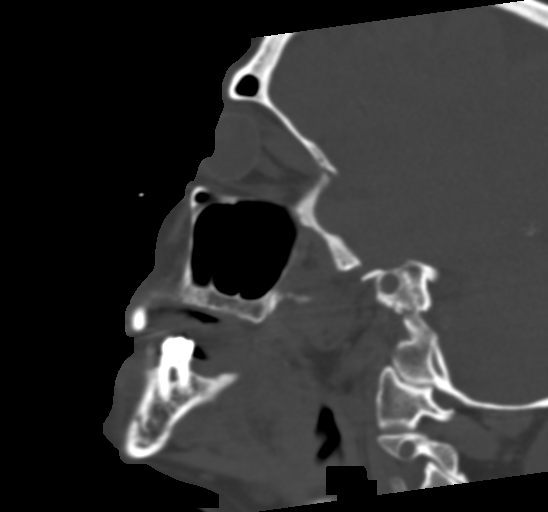

[15 of 47 positions shown; findings below may reference images not displayed]

FINDINGS: CT HEAD FINDINGS

Brain:

No evidence of large-territorial acute infarction. No parenchymal
hemorrhage. No mass lesion. No extra-axial collection.

No mass effect or midline shift. No hydrocephalus. Basilar cisterns
are patent.

Vascular: No hyperdense vessel.

Skull: No acute fracture or focal lesion.

Other: None.

CT MAXILLOFACIAL FINDINGS

Osseous: No fracture or mandibular dislocation. No destructive
process.

Sinuses/Orbits: Paranasal sinuses and mastoid air cells are clear.
Bilateral lens replacement. Otherwise the orbits are unremarkable.

Soft tissues: There is a 1.6 cm subcutaneus soft tissue hematoma
formation along the left frontal/periorbital region.

CT CERVICAL SPINE FINDINGS

Alignment: Normal.

Skull base and vertebrae: Multilevel degenerative changes of the
spine. No acute fracture. No aggressive appearing focal osseous
lesion or focal pathologic process.

Soft tissues and spinal canal: No prevertebral fluid or swelling. No
visible canal hematoma.

Upper chest: Unremarkable.

Other: There is a 1.8 cm left thyroid gland hypodense nodule.
Subcentimeter right hypodense thyroid gland nodule.
IMPRESSION: 1. No acute intracranial abnormality.
2.  No acute displaced facial fracture.
3. No acute displaced fracture or traumatic listhesis of the
cervical spine.
4. A 1.8 cm left thyroid gland hypodense nodule. Recommend thyroid
US (ref: [HOSPITAL]. [DATE]): 143-50).

## 2021-10-05 DIAGNOSIS — M25662 Stiffness of left knee, not elsewhere classified: Secondary | ICD-10-CM | POA: Diagnosis not present

## 2021-10-05 DIAGNOSIS — M25562 Pain in left knee: Secondary | ICD-10-CM | POA: Diagnosis not present

## 2021-10-05 DIAGNOSIS — M6281 Muscle weakness (generalized): Secondary | ICD-10-CM | POA: Diagnosis not present

## 2021-10-05 DIAGNOSIS — M1712 Unilateral primary osteoarthritis, left knee: Secondary | ICD-10-CM | POA: Diagnosis not present

## 2021-10-08 DIAGNOSIS — M1712 Unilateral primary osteoarthritis, left knee: Secondary | ICD-10-CM | POA: Diagnosis not present

## 2021-10-08 DIAGNOSIS — M6281 Muscle weakness (generalized): Secondary | ICD-10-CM | POA: Diagnosis not present

## 2021-10-08 DIAGNOSIS — M25662 Stiffness of left knee, not elsewhere classified: Secondary | ICD-10-CM | POA: Diagnosis not present

## 2021-10-08 DIAGNOSIS — M25562 Pain in left knee: Secondary | ICD-10-CM | POA: Diagnosis not present

## 2021-10-12 DIAGNOSIS — M1712 Unilateral primary osteoarthritis, left knee: Secondary | ICD-10-CM | POA: Diagnosis not present

## 2021-10-12 DIAGNOSIS — M6281 Muscle weakness (generalized): Secondary | ICD-10-CM | POA: Diagnosis not present

## 2021-10-12 DIAGNOSIS — M25562 Pain in left knee: Secondary | ICD-10-CM | POA: Diagnosis not present

## 2021-10-12 DIAGNOSIS — M25662 Stiffness of left knee, not elsewhere classified: Secondary | ICD-10-CM | POA: Diagnosis not present

## 2021-10-15 DIAGNOSIS — M25562 Pain in left knee: Secondary | ICD-10-CM | POA: Diagnosis not present

## 2021-10-15 DIAGNOSIS — M1712 Unilateral primary osteoarthritis, left knee: Secondary | ICD-10-CM | POA: Diagnosis not present

## 2021-10-15 DIAGNOSIS — M6281 Muscle weakness (generalized): Secondary | ICD-10-CM | POA: Diagnosis not present

## 2021-10-15 DIAGNOSIS — M25662 Stiffness of left knee, not elsewhere classified: Secondary | ICD-10-CM | POA: Diagnosis not present

## 2021-10-19 DIAGNOSIS — M25562 Pain in left knee: Secondary | ICD-10-CM | POA: Diagnosis not present

## 2021-10-19 DIAGNOSIS — M6281 Muscle weakness (generalized): Secondary | ICD-10-CM | POA: Diagnosis not present

## 2021-10-19 DIAGNOSIS — M25662 Stiffness of left knee, not elsewhere classified: Secondary | ICD-10-CM | POA: Diagnosis not present

## 2021-10-19 DIAGNOSIS — M1712 Unilateral primary osteoarthritis, left knee: Secondary | ICD-10-CM | POA: Diagnosis not present

## 2021-10-22 DIAGNOSIS — M1712 Unilateral primary osteoarthritis, left knee: Secondary | ICD-10-CM | POA: Diagnosis not present

## 2021-10-22 DIAGNOSIS — M6281 Muscle weakness (generalized): Secondary | ICD-10-CM | POA: Diagnosis not present

## 2021-10-22 DIAGNOSIS — M25662 Stiffness of left knee, not elsewhere classified: Secondary | ICD-10-CM | POA: Diagnosis not present

## 2021-10-22 DIAGNOSIS — M25562 Pain in left knee: Secondary | ICD-10-CM | POA: Diagnosis not present

## 2021-10-27 DIAGNOSIS — M25662 Stiffness of left knee, not elsewhere classified: Secondary | ICD-10-CM | POA: Diagnosis not present

## 2021-10-27 DIAGNOSIS — M6281 Muscle weakness (generalized): Secondary | ICD-10-CM | POA: Diagnosis not present

## 2021-10-27 DIAGNOSIS — M25562 Pain in left knee: Secondary | ICD-10-CM | POA: Diagnosis not present

## 2021-10-27 DIAGNOSIS — M1712 Unilateral primary osteoarthritis, left knee: Secondary | ICD-10-CM | POA: Diagnosis not present

## 2021-10-29 DIAGNOSIS — M25562 Pain in left knee: Secondary | ICD-10-CM | POA: Diagnosis not present

## 2021-10-29 DIAGNOSIS — M6281 Muscle weakness (generalized): Secondary | ICD-10-CM | POA: Diagnosis not present

## 2021-10-29 DIAGNOSIS — M1712 Unilateral primary osteoarthritis, left knee: Secondary | ICD-10-CM | POA: Diagnosis not present

## 2021-10-29 DIAGNOSIS — M25662 Stiffness of left knee, not elsewhere classified: Secondary | ICD-10-CM | POA: Diagnosis not present

## 2021-11-03 DIAGNOSIS — M6281 Muscle weakness (generalized): Secondary | ICD-10-CM | POA: Diagnosis not present

## 2021-11-03 DIAGNOSIS — M1712 Unilateral primary osteoarthritis, left knee: Secondary | ICD-10-CM | POA: Diagnosis not present

## 2021-11-03 DIAGNOSIS — M25662 Stiffness of left knee, not elsewhere classified: Secondary | ICD-10-CM | POA: Diagnosis not present

## 2021-11-03 DIAGNOSIS — M25562 Pain in left knee: Secondary | ICD-10-CM | POA: Diagnosis not present

## 2021-11-05 DIAGNOSIS — M1712 Unilateral primary osteoarthritis, left knee: Secondary | ICD-10-CM | POA: Diagnosis not present

## 2021-11-05 DIAGNOSIS — M25662 Stiffness of left knee, not elsewhere classified: Secondary | ICD-10-CM | POA: Diagnosis not present

## 2021-11-05 DIAGNOSIS — M25562 Pain in left knee: Secondary | ICD-10-CM | POA: Diagnosis not present

## 2021-11-05 DIAGNOSIS — M6281 Muscle weakness (generalized): Secondary | ICD-10-CM | POA: Diagnosis not present

## 2021-11-25 DIAGNOSIS — M1711 Unilateral primary osteoarthritis, right knee: Secondary | ICD-10-CM | POA: Diagnosis not present

## 2021-12-02 DIAGNOSIS — M1711 Unilateral primary osteoarthritis, right knee: Secondary | ICD-10-CM | POA: Diagnosis not present

## 2021-12-08 DIAGNOSIS — F331 Major depressive disorder, recurrent, moderate: Secondary | ICD-10-CM | POA: Diagnosis not present

## 2021-12-08 DIAGNOSIS — I10 Essential (primary) hypertension: Secondary | ICD-10-CM | POA: Diagnosis not present

## 2021-12-08 DIAGNOSIS — E782 Mixed hyperlipidemia: Secondary | ICD-10-CM | POA: Diagnosis not present

## 2021-12-08 DIAGNOSIS — E1121 Type 2 diabetes mellitus with diabetic nephropathy: Secondary | ICD-10-CM | POA: Diagnosis not present

## 2021-12-09 DIAGNOSIS — M1711 Unilateral primary osteoarthritis, right knee: Secondary | ICD-10-CM | POA: Diagnosis not present

## 2021-12-16 DIAGNOSIS — M1711 Unilateral primary osteoarthritis, right knee: Secondary | ICD-10-CM | POA: Diagnosis not present

## 2021-12-17 ENCOUNTER — Encounter: Payer: Self-pay | Admitting: Neurology

## 2021-12-17 ENCOUNTER — Ambulatory Visit (INDEPENDENT_AMBULATORY_CARE_PROVIDER_SITE_OTHER): Payer: Medicare Other | Admitting: Neurology

## 2021-12-17 VITALS — BP 157/87 | HR 81 | Ht 65.0 in | Wt 185.0 lb

## 2021-12-17 DIAGNOSIS — G6289 Other specified polyneuropathies: Secondary | ICD-10-CM | POA: Diagnosis not present

## 2021-12-17 DIAGNOSIS — G629 Polyneuropathy, unspecified: Secondary | ICD-10-CM

## 2021-12-17 DIAGNOSIS — E119 Type 2 diabetes mellitus without complications: Secondary | ICD-10-CM

## 2021-12-17 DIAGNOSIS — R2689 Other abnormalities of gait and mobility: Secondary | ICD-10-CM

## 2021-12-17 DIAGNOSIS — W19XXXD Unspecified fall, subsequent encounter: Secondary | ICD-10-CM

## 2021-12-17 MED ORDER — GABAPENTIN 100 MG PO CAPS: 100.0000 mg | ORAL_CAPSULE | Freq: Three times a day (TID) | ORAL | 6 refills | Status: DC

## 2021-12-17 NOTE — Progress Notes (Signed)
[facility]  PATIENT: [name] DOB: [birth year]  REFERRING CLINICIAN: Jonathon Jordan, MD HISTORY FROM: Patient  REASON FOR VISIT: Multiple falls.   HISTORICAL  CHIEF COMPLAINT:  Chief Complaint  Patient presents with   Follow-up    Rm EMG/NCV 3. Alone. Pt reports two falls since surgery. Pt is currently out of gabapentin, requesting refills.    INTERVAL HISTORY 12/17/21:  Patient presents today for follow-up, at last visit plan was to start gabapentin for neuropathy.  She reported gabapentin helps with the neuropathy but she ran out of medication.  In January she did have total knee replacement on the left but currently her right knee is flaring up.  She completed PT and is doing PT at home. No other complaints.   INTERNAL HISTORY 06/18/2021  Patient presented for follow-up, last visit was on August 19, at that time plan was to get neuropathy labs, EEG for further evaluation.  EEG completed, did not show any epileptiform discharge, no seizures.  Her neuropathy labs including hemoglobin A1c, TSH and vitamin B12 were all within normal limits; her vitamin D level was low but she is on vitamin D supplement.  Patient reports since last visit she had 2 near falls and they both occurred after sudden turn, she lost her equilibrium but was able to catch herself.  No other injuries.  She does complain of pain in the lower extremities associated with her neuropathy and left knee pain which she is scheduled to have surgery in January.  She does not use assistive device for ambulation pain.   HISTORY OF PRESENT ILLNESS:  This is a 71 year old woman with past medical history of hypertension hyperlipidemia, anxiety depression, colon cancer treated with surgery who is presented after multiple falls for the past year.  Patient stated in the past this is a having a total of 4 falls of unclear mechanism.  She mentioned for one fall, she remembers tripping and falling but the  other 3 falls she did not know how it happened other than the fact that she woke up from the ground with injuries.  She denies any warning sign prior to the fall, denies any lightheadedness, no change in vision and no abnormal heartbeat prior to the fall.  Her last one was on August 6 when she had left forehead laceration requiring multiple stitches.  At that time a head CT was done no evidence of intracranial bleeding.  She also reports urinary incontinence with 1 event.  Patient stated she lives alone no one had witnessed the falls.  She reported no confusion after falls.  She reports following up with a cardiologist had and was told that everything was normal including an echocardiogram of the heart.   Patient also reports a balance problem, she cannot stand on one foot, reports that when shutting eye, she sway, report some numbness in the legs.   OTHER MEDICAL CONDITIONS: No reported heart disease, HTN, HLD, Anxiety/Depression, history of cancer treated with surgery    REVIEW OF SYSTEMS: Full 14 system review of systems performed and negative with exception of: as noted in the HPI  ALLERGIES: Allergies  Allergen Reactions   Benadryl [Diphenhydramine Hcl] Nausea And Vomiting and Other (See Comments)    Migraine headaches.   Lansoprazole Nausea And Vomiting and Other (See Comments)    Migraine headache   Morphine Sulfate Nausea And Vomiting and Other (See Comments)    Migraine headaches.   Penicillins Nausea And Vomiting and Other (See Comments)  INTOLERANCE >  NAUSEA & VOMITING YEAST INFECTION > CHANGE IN NORMAL FLORA  Has patient had a PCN reaction causing immediate rash, facial/tongue/throat swelling, SOB or lightheadedness with hypotension:No Has patient had a PCN reaction causing severe rash involving mucus membranes or skin necrosis: No Has patient had a PCN reaction that required hospitalization No Has patient had a PCN reaction occurring within the last 10 years:No    Red Dye  Other (See Comments)    Also allergic to [facility] Dye-migraine headaches.   Simvastatin Nausea And Vomiting and Other (See Comments)    Migraine    Codeine Nausea And Vomiting   Shellfish Allergy Other (See Comments)    Congestion   Spiriva Respimat [Tiotropium Bromide Monohydrate] Cough   Sulfa Antibiotics Nausea And Vomiting    HOME MEDICATIONS: Outpatient Medications Prior to Visit  Medication Sig Dispense Refill   acetaminophen (TYLENOL) 500 MG tablet Take 2 tablets (1,000 mg total) by mouth every 6 (six) hours as needed for mild pain or moderate pain. 60 tablet 0   amitriptyline (ELAVIL) 100 MG tablet Take 100 mg by mouth at bedtime.     ARIPiprazole (ABILIFY) 15 MG tablet Take 15 mg by mouth daily.     calcium carbonate (TUMS - DOSED IN MG ELEMENTAL CALCIUM) 500 MG chewable tablet Chew 2 tablets by mouth 4 (four) times daily as needed for indigestion or heartburn.     Cholecalciferol (VITAMIN D) 2000 units CAPS Take 2,000 Units by mouth at bedtime.     Cyanocobalamin (VITAMIN B-12) 5000 MCG TBDP Take 5,000 mcg by mouth at bedtime.     metFORMIN (GLUCOPHAGE-XR) 500 MG 24 hr tablet Take 1,000 mg by mouth every evening.     ondansetron (ZOFRAN-ODT) 4 MG disintegrating tablet Take 1 tablet (4 mg total) by mouth every 8 (eight) hours as needed for nausea or vomiting. 30 tablet 0   PARoxetine (PAXIL) 20 MG tablet Take 20 mg by mouth daily.     rosuvastatin (CRESTOR) 20 MG tablet TAKE 1 TABLET BY MOUTH AT BEDTIME (Patient taking differently: Take 20 mg by mouth daily.) 90 tablet 3   losartan (COZAAR) 25 MG tablet Take 1 tablet (25 mg total) by mouth daily. 90 tablet 3   omeprazole (PRILOSEC OTC) 20 MG tablet Take 1 tablet (20 mg total) by mouth daily. For gastric protection 30 tablet 0   aspirin EC 81 MG tablet Take 1 tablet (81 mg total) by mouth 2 (two) times daily. For DVT prophylaxis for 30 days after surgery. 60 tablet 0   gabapentin (NEURONTIN) 100 MG capsule Take 1 capsule (100 mg  total) by mouth 3 (three) times daily. (Patient not taking: Reported on 12/17/2021) 90 capsule 0   meloxicam (MOBIC) 15 MG tablet Take 1 tablet (15 mg total) by mouth daily as needed for pain (and inflammation). 30 tablet 0   methocarbamol (ROBAXIN-750) 750 MG tablet Take 1 tablet (750 mg total) by mouth every 8 (eight) hours as needed for muscle spasms. 20 tablet 0   oxyCODONE (ROXICODONE) 5 MG immediate release tablet Take 1 tablet (5 mg total) by mouth every 6 (six) hours as needed for severe pain. Do not take more than 6 tablets in a 24 hour period. 28 tablet 0   No facility-administered medications prior to visit.    PAST MEDICAL HISTORY: Past Medical History:  Diagnosis Date   Alcohol abuse, in remission    Cancer of sigmoid colon ([place]) 07/2005   T2N0M0   Chronic bronchitis  COPD (chronic obstructive pulmonary disease) (HCC)    Depression    Diabetes mellitus without complication (HCC)    Dyspnea    GERD (gastroesophageal reflux disease)    History of thyroid nodule    monitoring   Hyperlipidemia    Hypertension    Migraines    OA (osteoarthritis)    PONV (postoperative nausea and vomiting)    Reflux     PAST SURGICAL HISTORY: Past Surgical History:  Procedure Laterality Date   ABDOMINAL EXPOSURE N/A 11/24/2016   Procedure: ABDOMINAL EXPOSURE;  Surgeon: Rosetta Posner, MD;  Location: [facility];  Service: Vascular;  Laterality: N/A;   ABDOMINAL HYSTERECTOMY  1992   age 59 and appendics   ANTERIOR LUMBAR FUSION Bilateral 11/24/2016   Procedure: LUMBAR 5-SACRUM 1 ANTERIOR LUMBAR INTERBODY FUSION;  Surgeon: Phylliss Bob, MD;  Location: [facility];  Service: Orthopedics;  Laterality: Bilateral;  LUMBAR 5-SACRUM 1 ANTERIOR LUMBAR INTERBODY FUSION    CHOLECYSTECTOMY  2007   COLON RESECTION  2006   COLONOSCOPY  2019   HAND SURGERY  2010   repair of incisional hernia  2012   SHOULDER ARTHROSCOPY Left 2009   SHOULDER SURGERY Right 1997   rotator cuff   TONSILLECTOMY     as a child    TOTAL KNEE ARTHROPLASTY Left 08/11/2021   Procedure: TOTAL KNEE ARTHROPLASTY;  Surgeon: Renette Butters, MD;  Location: [facility];  Service: Orthopedics;  Laterality: Left;    FAMILY HISTORY: Family History  Problem Relation Age of Onset   Heart disease Mother        Previous MI at age 40-55   Atrial fibrillation Mother    Stroke Mother    Heart failure Mother    Seizures Father        poss d/t alcohol   Cancer Father 75       Prostate cancer    Diabetes Sister    Arrhythmia Sister    Cancer Maternal Aunt        lung cancer    Cancer Maternal Uncle        kidney ca    SOCIAL HISTORY: Social History   Socioeconomic History   Marital status: Soil scientist    Spouse name: Not on file   Number of children: 0   Years of education: Not on file   Highest education level: Some college, no degree  Occupational History   Occupation: pet care  Tobacco Use   Smoking status: Every Day    Packs/day: 0.50    Years: 50.00    Pack years: 25.00    Types: Cigarettes, E-cigarettes    Start date: 1971   Smokeless tobacco: Never   Tobacco comments:    0.5 ppd of cigerettes , using e cigarette   Vaping Use   Vaping Use: Every day   Substances: Nicotine, Flavoring  Substance and Sexual Activity   Alcohol use: No    Comment: recovering  alcoholic   Drug use: No   Sexual activity: Not on file  Other Topics Concern   Not on file  Social History Narrative   Caffeine 3 cups per day    Lives with Partner Dawn   Social Determinants of Health   Financial Resource Strain: Not on file  Food Insecurity: Not on file  Transportation Needs: Not on file  Physical Activity: Not on file  Stress: Not on file  Social Connections: Not on file  Intimate Partner Violence: Not on file    PHYSICAL  EXAM GENERAL EXAM/CONSTITUTIONAL: Vitals:  Vitals:   12/17/21 1126  BP: (!) 157/87  Pulse: 81  Weight: 185 lb (83.9 kg)  Height: '5\' 5"'$  (1.651 m)   Body mass index is 30.79 kg/m. Wt  Readings from Last 3 Encounters:  12/17/21 185 lb (83.9 kg)  08/11/21 180 lb (81.6 kg)  07/31/21 180 lb (81.6 kg)   Patient is in no distress; well developed, nourished and groomed; neck is supple  EYES: Pupils round and reactive to light, Visual fields full to confrontation, Extraocular movements intacts,   MUSCULOSKELETAL: Gait, strength, tone, movements noted in Neurologic exam below  NEUROLOGIC: MENTAL STATUS:  awake, alert, oriented to person, place and time recent and remote memory intact normal attention and concentration language fluent, comprehension intact, naming intact fund of knowledge appropriate  CRANIAL NERVE:  2nd, 3rd, 4th, 6th - pupils equal and reactive to light, visual fields full to confrontation, extraocular muscles intact, no nystagmus 5th - facial sensation symmetric 7th - facial strength symmetric 8th - hearing intact 9th - palate elevates symmetrically, uvula midline 11th - shoulder shrug symmetric 12th - tongue protrusion midline  MOTOR:  normal bulk and tone, full strength in the BUE, BLE  SENSORY:  Decrease sensation to vibration and pinprick to the lower extremities up to ankle. Positive Romberg.  COORDINATION:  finger-nose-finger, fine finger movements normal  REFLEXES:  deep tendon reflexes present and symmetric  GAIT/STATION:  Positive Romberg, wide based gait. Unable to tandem gait   DIAGNOSTIC DATA (LABS, IMAGING, TESTING) - I reviewed patient records, labs, notes, testing and imaging myself where available.  Lab Results  Component Value Date   WBC 11.5 (H) 07/31/2021   HGB 13.5 07/31/2021   HCT 41.9 07/31/2021   MCV 100.7 (H) 07/31/2021   PLT 252 07/31/2021      Component Value Date/Time   NA 137 07/31/2021 1058   NA 139 09/10/2016 1443   K 4.1 07/31/2021 1058   K 3.7 09/10/2016 1443   CL 102 07/31/2021 1058   CO2 28 07/31/2021 1058   CO2 29 09/10/2016 1443   GLUCOSE 104 (H) 07/31/2021 1058   GLUCOSE 94 09/10/2016  1443   BUN 13 07/31/2021 1058   BUN 20.2 09/10/2016 1443   CREATININE 0.76 07/31/2021 1058   CREATININE 0.9 09/10/2016 1443   CALCIUM 8.9 07/31/2021 1058   CALCIUM 10.3 09/10/2016 1443   PROT 7.1 07/31/2021 1058   PROT 7.6 09/10/2016 1443   ALBUMIN 3.6 07/31/2021 1058   ALBUMIN 4.1 09/10/2016 1443   AST 19 07/31/2021 1058   AST 14 09/10/2016 1443   ALT 16 07/31/2021 1058   ALT 8 09/10/2016 1443   ALKPHOS 80 07/31/2021 1058   ALKPHOS 88 09/10/2016 1443   BILITOT 0.6 07/31/2021 1058   BILITOT 0.42 09/10/2016 1443   GFRNONAA >60 07/31/2021 1058   GFRAA >60 04/14/2020 1716   No results found for: CHOL, HDL, LDLCALC, LDLDIRECT, TRIG, CHOLHDL Lab Results  Component Value Date   HGBA1C 6.8 (H) 07/31/2021   Lab Results  Component Value Date   VITAMINB12 438 03/20/2021   Lab Results  Component Value Date   TSH 2.780 03/20/2021     Head CT 03/07/2021: 1.  No evidence for acute intracranial abnormality. 2. LEFT frontal scalp laceration not associated fracture. 3. Chronic paranasal sinus changes. 4. Mid cervical degenerative changes without acute cervical spine injury. 5. Re-demonstrated LEFT thyroid nodule for which biopsy has been recommended.   Routine EEG 8/31: This is a normal EEG  recording in the waking and drowsy state. No evidence of interictal epileptiform discharges seen. A normal EEG does not exclude a diagnosis of epilepsy.    ASSESSMENT AND PLAN  71 y.o. year old female with past medical history of hypertension, hyperlipidemia, anxiety depression, vitamin D deficiency, and peripheral neuropathy who is presenting for follow-up regarding her multiple falls and neuropathy. She had a knee replacement surgery in January of this year and since then that 2 for near falls, denies any injury.  She said currently her right knee is bothering her and she is expecting to have a knee surgery January next year.  In terms of the neuropathy, patient reports the gabapentin helps  but she ran out.  I will refill the medication, advised patient to continue with physical therapy, to use knee braces for both knees and to follow-up with the orthopedist as scheduled.  I will see her in 1 year for follow-up or sooner if worse.  She is comfortable with plans.    1. Fall, subsequent encounter   2. Other polyneuropathy   3. Neuropathy   4. Type 2 diabetes mellitus without complication, without long-term current use of insulin (HCC)   5. Other abnormalities of gait and mobility      PLAN: Continue with Gabapentin 100 mg twice daily. Continue other medications Continue physical therapy Follow-up with your doctors Return in 1 year or sooner if worse.   No orders of the defined types were placed in this encounter.   Meds ordered this encounter  Medications   gabapentin (NEURONTIN) 100 MG capsule    Sig: Take 1 capsule (100 mg total) by mouth 3 (three) times daily.    Dispense:  90 capsule    Refill:  6    Return in about 1 year (around 12/18/2022).  I have spent a total of 30 minutes dedicated to this patient today, preparing to see patient, performing a medically appropriate examination and evaluation, ordering tests and/or medications and procedures, and counseling and educating the patient/family/caregiver; independently interpreting result and communicating results to the family/patient/caregiver; and documenting clinical information in the electronic medical record.   Alric Ran, MD 12/17/2021, 1:54 PM  [facility] [address], [place], [place] [address] [phone]

## 2021-12-17 NOTE — Patient Instructions (Signed)
Continue with Gabapentin 100 mg twice daily. Continue other medications Continue physical therapy Follow-up with your doctors Return in 1 year or sooner if worse.

## 2022-01-19 ENCOUNTER — Other Ambulatory Visit: Payer: Self-pay

## 2022-01-19 MED ORDER — ROSUVASTATIN CALCIUM 20 MG PO TABS: 20.0000 mg | ORAL_TABLET | Freq: Every day | ORAL | 3 refills | Status: DC

## 2022-02-01 ENCOUNTER — Telehealth: Payer: Self-pay

## 2022-02-01 DIAGNOSIS — M1711 Unilateral primary osteoarthritis, right knee: Secondary | ICD-10-CM | POA: Diagnosis not present

## 2022-02-01 NOTE — Telephone Encounter (Signed)
Left message for the pt to call the office back to schedule an in office appt for pre op clearance. Pt can see Dr. Audie Box or APP.

## 2022-02-01 NOTE — Telephone Encounter (Signed)
   Pre-operative Risk Assessment    Patient Name: [name]  DOB: 01-Mar-1951 MRN: [id number]      Request for Surgical Clearance    Procedure:   Rt total knee replacement  Date of Surgery:  Clearance TBD                                 Surgeon:  Edmonia Lynch, M.D. Surgeon's Group or Practice Name:  [facility] Phone number:  [phone] Ext [phone]  Fax number:  [phone] ATTN: [facility]   Type of Clearance Requested:   - Medical    Type of Anesthesia:  Spinal   Additional requests/questions:   None  Signed, Annelise Mccoy   02/01/2022, 4:27 PM

## 2022-02-01 NOTE — Telephone Encounter (Signed)
   Name: [name]  DOB: Nov 13, 1950  MRN: [id number]  Primary Cardiologist: Evalina Field, MD  Chart reviewed as part of pre-operative protocol coverage. Because of [name]'s past medical history and time since last visit, she will require a follow-up in-office visit in order to better assess preoperative cardiovascular risk.  Pre-op covering staff: - Please schedule appointment and call patient to inform them. If patient already had an upcoming appointment within acceptable timeframe, please add "pre-op clearance" to the appointment notes so provider is aware. - Please contact requesting surgeon's office via preferred method (i.e, phone, fax) to inform them of need for appointment prior to surgery.    Lenna Sciara, NP  02/01/2022, 4:37 PM

## 2022-02-03 ENCOUNTER — Telehealth: Payer: Self-pay | Admitting: Pulmonary Disease

## 2022-02-03 NOTE — Telephone Encounter (Signed)
I faxed last ov note containing surgery clearance to [employer].

## 2022-02-03 NOTE — Telephone Encounter (Signed)
Fax received from Dr. Edmonia Lynch from [facility] to perform a RIGHT total knee replacement on patient.  Patient needs surgery clearance. Patient was seen on 07/02/2021. Office protocol is a risk assessment can be sent to surgeon if patient has been seen in 60 days or less.    Sending to Dr. Loanne Drilling for risk assessment or recommendations if patient needs to be seen in office prior to surgical procedure.

## 2022-02-03 NOTE — Telephone Encounter (Signed)
Looks like Dr. Loanne Drilling performed preop evaluation at last visit. Can just send last visit note.

## 2022-02-03 NOTE — Telephone Encounter (Signed)
Pt has in office appt 02/11/22 with Dr. Audie Box for pre op clearance. I will forward notes to MD for upcoming appt. Will send FYI to requesting office the pt has appt

## 2022-02-05 DIAGNOSIS — E782 Mixed hyperlipidemia: Secondary | ICD-10-CM | POA: Diagnosis not present

## 2022-02-05 DIAGNOSIS — Z79899 Other long term (current) drug therapy: Secondary | ICD-10-CM | POA: Diagnosis not present

## 2022-02-05 DIAGNOSIS — E041 Nontoxic single thyroid nodule: Secondary | ICD-10-CM | POA: Diagnosis not present

## 2022-02-05 DIAGNOSIS — Z0000 Encounter for general adult medical examination without abnormal findings: Secondary | ICD-10-CM | POA: Diagnosis not present

## 2022-02-05 DIAGNOSIS — E1121 Type 2 diabetes mellitus with diabetic nephropathy: Secondary | ICD-10-CM | POA: Diagnosis not present

## 2022-02-05 DIAGNOSIS — I10 Essential (primary) hypertension: Secondary | ICD-10-CM | POA: Diagnosis not present

## 2022-02-10 NOTE — Progress Notes (Unsigned)
Cardiology Office Note:   Date:  02/11/2022  NAME:  [name]    MRN: [id number] DOB:  November 14, 1950   PCP:  Jonathon Jordan, MD  Cardiologist:  Evalina Field, MD  Electrophysiologist:  None   Referring MD: Jonathon Jordan, MD   Chief Complaint  Patient presents with   Follow-up        History of Present Illness:   [name] is a 71 y.o. female with a hx of COPD, hypertension, diabetes, hyperlipidemia who presents for follow-up.  She reports she will have left knee surgery.  Recently had a right knee replaced.  EKG demonstrates sinus rhythm with single PAC.  She denies chest pains or trouble breathing.  Still smoking 1/2 pack/day.  Blood pressure is well controlled.  She can complete greater than 4 METS without limitations.   Problem List 1. DM -A1c 7.4 2. HTN 3. HLD -T chol 135, HDL 50, LDL 48, triglycerides 235 4. Tobacco abuse/COPD -minimal COPD -53 years  5. Coronary calcification on CT scan  -minimal LAD calcifications   Past Medical History: Past Medical History:  Diagnosis Date   Alcohol abuse, in remission    Cancer of sigmoid colon ([place]) 07/2005   T2N0M0   Chronic bronchitis    COPD (chronic obstructive pulmonary disease) (HCC)    Depression    Diabetes mellitus without complication (HCC)    Dyspnea    GERD (gastroesophageal reflux disease)    History of thyroid nodule    monitoring   Hyperlipidemia    Hypertension    Migraines    OA (osteoarthritis)    PONV (postoperative nausea and vomiting)    Reflux     Past Surgical History: Past Surgical History:  Procedure Laterality Date   ABDOMINAL EXPOSURE N/A 11/24/2016   Procedure: ABDOMINAL EXPOSURE;  Surgeon: Rosetta Posner, MD;  Location: [place];  Service: Vascular;  Laterality: N/A;   ABDOMINAL HYSTERECTOMY  1992   age 28 and appendics   ANTERIOR LUMBAR FUSION Bilateral 11/24/2016   Procedure: LUMBAR 5-SACRUM 1 ANTERIOR LUMBAR INTERBODY FUSION;  Surgeon: Phylliss Bob, MD;  Location: [place];   Service: Orthopedics;  Laterality: Bilateral;  LUMBAR 5-SACRUM 1 ANTERIOR LUMBAR INTERBODY FUSION    CHOLECYSTECTOMY  2007   COLON RESECTION  2006   COLONOSCOPY  2019   HAND SURGERY  2010   repair of incisional hernia  2012   SHOULDER ARTHROSCOPY Left 2009   SHOULDER SURGERY Right 1997   rotator cuff   TONSILLECTOMY     as a child   TOTAL KNEE ARTHROPLASTY Left 08/11/2021   Procedure: TOTAL KNEE ARTHROPLASTY;  Surgeon: Renette Butters, MD;  Location[facility];  Service: Orthopedics;  Laterality: Left;    Current Medications: Current Meds  Medication Sig   acetaminophen (TYLENOL) 500 MG tablet Take 2 tablets (1,000 mg total) by mouth every 6 (six) hours as needed for mild pain or moderate pain.   amitriptyline (ELAVIL) 100 MG tablet Take 100 mg by mouth at bedtime.   ARIPiprazole (ABILIFY) 15 MG tablet Take 15 mg by mouth daily.   calcium carbonate (TUMS - DOSED IN MG ELEMENTAL CALCIUM) 500 MG chewable tablet Chew 2 tablets by mouth 4 (four) times daily as needed for indigestion or heartburn.   Cholecalciferol (VITAMIN D) 2000 units CAPS Take 2,000 Units by mouth at bedtime.   Cyanocobalamin (VITAMIN B-12) 5000 MCG TBDP Take 5,000 mcg by mouth at bedtime.   gabapentin (NEURONTIN) 100 MG capsule Take 1  capsule (100 mg total) by mouth 3 (three) times daily.   losartan (COZAAR) 25 MG tablet Take 1 tablet (25 mg total) by mouth daily.   metFORMIN (GLUCOPHAGE-XR) 500 MG 24 hr tablet Take 1,000 mg by mouth every evening.   omeprazole (PRILOSEC OTC) 20 MG tablet Take 1 tablet (20 mg total) by mouth daily. For gastric protection   PARoxetine (PAXIL) 20 MG tablet Take 20 mg by mouth daily.   rosuvastatin (CRESTOR) 20 MG tablet Take 1 tablet (20 mg total) by mouth at bedtime.     Allergies:    Benadryl [diphenhydramine hcl], Lansoprazole, Morphine sulfate, Penicillins, Red dye, Simvastatin, Codeine, Shellfish allergy, Spiriva respimat [tiotropium bromide monohydrate], and Sulfa antibiotics    Social History: Social History   Socioeconomic History   Marital status: Soil scientist    Spouse name: Not on file   Number of children: 0   Years of education: Not on file   Highest education level: Some college, no degree  Occupational History   Occupation: pet care  Tobacco Use   Smoking status: Every Day    Packs/day: 0.50    Years: 50.00    Total pack years: 25.00    Types: Cigarettes, E-cigarettes    Start date: 1971   Smokeless tobacco: Never   Tobacco comments:    0.5 ppd of cigerettes , using e cigarette   Vaping Use   Vaping Use: Every day   Substances: Nicotine, Flavoring  Substance and Sexual Activity   Alcohol use: No    Comment: recovering  alcoholic   Drug use: No   Sexual activity: Not on file  Other Topics Concern   Not on file  Social History Narrative   Caffeine 3 cups per day    Lives with Partner Dawn   Social Determinants of Health   Financial Resource Strain: Not on file  Food Insecurity: Not on file  Transportation Needs: Not on file  Physical Activity: Not on file  Stress: Not on file  Social Connections: Not on file     Family History: The patient's family history includes Arrhythmia in her sister; Atrial fibrillation in her mother; Cancer in her maternal aunt and maternal uncle; Cancer (age of onset: 82) in her father; Diabetes in her sister; Heart disease in her mother; Heart failure in her mother; Seizures in her father; Stroke in her mother.  ROS:   All other ROS reviewed and negative. Pertinent positives noted in the HPI.     EKGs/Labs/Other Studies Reviewed:   The following studies were personally reviewed by me today:  EKG:  EKG is ordered today.  The ekg ordered today demonstrates normal sinus rhythm with PACs, no acute ischemic changes, and was personally reviewed by me.   Recent Labs: 03/20/2021: TSH 2.780 07/31/2021: ALT 16; BUN 13; Creatinine, Ser 0.76; Hemoglobin 13.5; Platelets 252; Potassium 4.1; Sodium 137    Recent Lipid Panel No results found for: "CHOL", "TRIG", "HDL", "CHOLHDL", "VLDL", "LDLCALC", "LDLDIRECT"  Physical Exam:   VS:  BP 140/86   Pulse 88   Ht '5\' 5"'$  (1.651 m)   Wt 180 lb 12.8 oz (82 kg)   SpO2 99%   BMI 30.09 kg/m    Wt Readings from Last 3 Encounters:  02/11/22 180 lb 12.8 oz (82 kg)  12/17/21 185 lb (83.9 kg)  08/11/21 180 lb (81.6 kg)    General: Well nourished, well developed, in no acute distress Head: Atraumatic, normal size  Eyes: PEERLA, EOMI  Neck: Supple, no  JVD Endocrine: No thryomegaly Cardiac: Normal S1, S2; RRR; no murmurs, rubs, or gallops Lungs: Clear to auscultation bilaterally, no wheezing, rhonchi or rales  Abd: Soft, nontender, no hepatomegaly  Ext: No edema, pulses 2+ Musculoskeletal: No deformities, BUE and BLE strength normal and equal Skin: Warm and dry, no rashes   Neuro: Alert and oriented to person, place, time, and situation, CNII-XII grossly intact, no focal deficits  Psych: Normal mood and affect   ASSESSMENT:   Leontyne D Lem is a 71 y.o. female who presents for the following: 1. Preoperative cardiovascular examination   2. Coronary artery calcification seen on CAT scan   3. Mixed hyperlipidemia   4. Tobacco abuse     PLAN:   1. Preoperative cardiovascular examination -RCRI equals 0.  Very low perioperative risk for major cardiovascular event.  Risk is 0.4%.  She has no history of MI, CHF, diabetes on insulin, CKD, stroke.  She can complete greater than 4 METS.  Would recommend no further testing.  Echocardiogram was normal.  EKG shows single PACs.  No symptoms from this.  Does not need further testing.  Thyroid studies are normal lately.  Echo was also normal.  2. Coronary artery calcification seen on CAT scan 3. Mixed hyperlipidemia -Minimal LAD coronary calcifications on CT.  She is diabetic.  She is currently on Crestor 20 mg daily.  Most recent LDL is at goal.  She will continue this.  4. Tobacco abuse -Still smoking  half pack per day.  3 minutes of smoking cessation counseling was provided in office today.  Disposition: Return in about 1 year (around 02/12/2023).  Medication Adjustments/Labs and Tests Ordered: Current medicines are reviewed at length with the patient today.  Concerns regarding medicines are outlined above.  Orders Placed This Encounter  Procedures   EKG 12-Lead   No orders of the defined types were placed in this encounter.   Patient Instructions  Medication Instructions:  The current medical regimen is effective;  continue present plan and medications.  *If you need a refill on your cardiac medications before your next appointment, please call your pharmacy*   Follow-Up: At [facility], you and your health needs are our priority.  As part of our continuing mission to provide you with exceptional heart care, we have created designated Provider Care Teams.  These Care Teams include your primary Cardiologist (physician) and Advanced Practice Providers (APPs -  Physician Assistants and Nurse Practitioners) who all work together to provide you with the care you need, when you need it.  We recommend signing up for the patient portal called "MyChart".  Sign up information is provided on this After Visit Summary.  MyChart is used to connect with patients for Virtual Visits (Telemedicine).  Patients are able to view lab/test results, encounter notes, upcoming appointments, etc.  Non-urgent messages can be sent to your provider as well.   To learn more about what you can do with MyChart, go to [email or web address].    Your next appointment:   12 month(s)  The format for your next appointment:   In Person  Provider:   Evalina Field, MD or Sande Rives PA-C or Almyra Deforest, PA-C           Time Spent with Patient: I have spent a total of 25 minutes with patient reviewing hospital notes, telemetry, EKGs, labs and examining the patient as well as establishing an assessment and  plan that was discussed with the patient.  > 50% of time was  spent in direct patient care.  Signed, Addison Naegeli. Audie Box, MD, [place]  [address] [place], [place] [address] [phone]  02/11/2022 3:34 PM

## 2022-02-11 ENCOUNTER — Ambulatory Visit: Payer: Medicare Other | Admitting: Cardiovascular Disease

## 2022-02-11 ENCOUNTER — Encounter: Payer: Self-pay | Admitting: Cardiovascular Disease

## 2022-02-11 VITALS — BP 140/86 | HR 88 | Ht 65.0 in | Wt 180.8 lb

## 2022-02-11 DIAGNOSIS — Z01810 Encounter for preprocedural cardiovascular examination: Secondary | ICD-10-CM

## 2022-02-11 DIAGNOSIS — Z720 Tobacco use: Secondary | ICD-10-CM | POA: Diagnosis not present

## 2022-02-11 DIAGNOSIS — E782 Mixed hyperlipidemia: Secondary | ICD-10-CM | POA: Diagnosis not present

## 2022-02-11 DIAGNOSIS — I2510 Atherosclerotic heart disease of native coronary artery without angina pectoris: Secondary | ICD-10-CM | POA: Diagnosis not present

## 2022-02-11 NOTE — Patient Instructions (Addendum)
Medication Instructions:  The current medical regimen is effective;  continue present plan and medications.  *If you need a refill on your cardiac medications before your next appointment, please call your pharmacy*   Follow-Up: At [facility], you and your health needs are our priority.  As part of our continuing mission to provide you with exceptional heart care, we have created designated Provider Care Teams.  These Care Teams include your primary Cardiologist (physician) and Advanced Practice Providers (APPs -  Physician Assistants and Nurse Practitioners) who all work together to provide you with the care you need, when you need it.  We recommend signing up for the patient portal called "MyChart".  Sign up information is provided on this After Visit Summary.  MyChart is used to connect with patients for Virtual Visits (Telemedicine).  Patients are able to view lab/test results, encounter notes, upcoming appointments, etc.  Non-urgent messages can be sent to your provider as well.   To learn more about what you can do with MyChart, go to [email or web address].    Your next appointment:   12 month(s)  The format for your next appointment:   In Person  Provider:   Evalina Field, MD or Sande Rives PA-C or Almyra Deforest, [place]

## 2022-04-07 DIAGNOSIS — M1711 Unilateral primary osteoarthritis, right knee: Secondary | ICD-10-CM | POA: Diagnosis not present

## 2022-04-21 ENCOUNTER — Ambulatory Visit: Payer: Medicare Other | Admitting: Podiatry

## 2022-04-21 DIAGNOSIS — B351 Tinea unguium: Secondary | ICD-10-CM

## 2022-04-21 DIAGNOSIS — M79675 Pain in left toe(s): Secondary | ICD-10-CM | POA: Diagnosis not present

## 2022-04-21 DIAGNOSIS — M79674 Pain in right toe(s): Secondary | ICD-10-CM

## 2022-04-21 NOTE — Progress Notes (Signed)
  Subjective:  Patient ID: [name], female    DOB: 1951-02-19,  MRN: [id number]  Chief Complaint  Patient presents with   Nail Problem    Nail trim    71 y.o. female returns for the above complaint.  Patient presents with thickened elongated dystrophic toenails x10 mild pain on palpation.  Hurts with ambulation hurts with pressure.  She would like for me to be down she is not able to do it herself.  She is a diabetic with unknown A1c  Objective:  There were no vitals filed for this visit. Podiatric Exam: Vascular: dorsalis pedis and posterior tibial pulses are palpable bilateral. Capillary return is immediate. Temperature gradient is WNL. Skin turgor WNL  Sensorium: Normal Semmes Weinstein monofilament test. Normal tactile sensation bilaterally. Nail Exam: Pt has thick disfigured discolored nails with subungual debris noted bilateral entire nail hallux through fifth toenails.  Pain on palpation to the nails. Ulcer Exam: There is no evidence of ulcer or pre-ulcerative changes or infection. Orthopedic Exam: Muscle tone and strength are WNL. No limitations in general ROM. No crepitus or effusions noted.  Skin: No Porokeratosis. No infection or ulcers    Assessment & Plan:   1. Pain due to onychomycosis of toenails of both feet     Patient was evaluated and treated and all questions answered.  Onychomycosis with pain  -Nails palliatively debrided as below. -Educated on self-care  Procedure: Nail Debridement Rationale: pain  Type of Debridement: manual, sharp debridement. Instrumentation: Nail nipper, rotary burr. Number of Nails: 10  Procedures and Treatment: Consent by patient was obtained for treatment procedures. The patient understood the discussion of treatment and procedures well. All questions were answered thoroughly reviewed. Debridement of mycotic and hypertrophic toenails, 1 through 5 bilateral and clearing of subungual debris. No ulceration, no infection noted.   Return Visit-Office Procedure: Patient instructed to return to the office for a follow up visit 3 months for continued evaluation and treatment.  Boneta Lucks, DPM    Return in about 3 months (around 07/21/2022) for Routine Foot Care.

## 2022-06-02 ENCOUNTER — Other Ambulatory Visit: Payer: Self-pay | Admitting: Surgery

## 2022-06-02 DIAGNOSIS — E042 Nontoxic multinodular goiter: Secondary | ICD-10-CM

## 2022-06-03 ENCOUNTER — Ambulatory Visit
Admission: RE | Admit: 2022-06-03 | Discharge: 2022-06-03 | Disposition: A | Discharge status: Home / Self Care | Payer: Medicare Other | Source: Ambulatory Visit | Attending: Surgery | Admitting: Surgery

## 2022-06-03 DIAGNOSIS — E042 Nontoxic multinodular goiter: Secondary | ICD-10-CM | POA: Diagnosis not present

## 2022-06-07 NOTE — Progress Notes (Signed)
USN is stable with no new or worrisome findings.  Brule, MD [facility] [place] practice Office: [phone]

## 2022-06-11 NOTE — Progress Notes (Signed)
Anesthesia Review:  PCP: Cardiologist : Eleonore Chiquito LOV 02/11/22  Pulm- Chi Elison  Neuro- Amadou Camara LOV 12/17/21  Chest x-ray : EKG : 02/11/22  Echo : 11/27/20  Stress test: Cardiac Cath :  Activity level:  Sleep Study/ CPAP : Fasting Blood Sugar :      / Checks Blood Sugar -- times a day:   Blood Thinner/ Instructions /Last Dose: ASA / Instructions/ Last Dose :  DM- type  Hgba1c-

## 2022-06-14 DIAGNOSIS — M1711 Unilateral primary osteoarthritis, right knee: Secondary | ICD-10-CM | POA: Diagnosis not present

## 2022-06-14 NOTE — H&P (Signed)
KNEE ARTHROPLASTY ADMISSION H&P  Patient ID: [name] MRN: [id number] DOB/AGE: 71-Dec-1952 71 y.o.  Chief Complaint: right knee pain.  Planned Procedure Date: 06/29/22 Medical Clearance by Dr. Alessandra Grout   Cardiac Clearance by Dr. Audie Box Pulmonary clearance by Dr. Loanne Drilling   HPI: [name] is a 71 y.o. female who presents for evaluation of OA RIGHT KNEE. The patient has a history of pain and functional disability in the right knee due to arthritis and has failed non-surgical conservative treatments for greater than 12 weeks to include NSAID's and/or analgesics, corticosteriod injections, viscosupplementation injections, use of assistive devices, and activity modification.  Onset of symptoms was gradual, starting >10 years ago with gradually worsening course since that time. The patient noted no past surgery on the right knee.  Patient currently rates pain at 8 out of 10 with activity. Patient has night pain, worsening of pain with activity and weight bearing, and pain that interferes with activities of daily living.  Patient has evidence of subchondral sclerosis, periarticular osteophytes, and joint space narrowing by imaging studies.  There is no active infection.  Past Medical History:  Diagnosis Date   Alcohol abuse, in remission    Cancer of sigmoid colon ([place]) 07/2005   T2N0M0   Chronic bronchitis    COPD (chronic obstructive pulmonary disease) (HCC)    Depression    Diabetes mellitus without complication (HCC)    Dyspnea    GERD (gastroesophageal reflux disease)    History of thyroid nodule    monitoring   Hyperlipidemia    Hypertension    Migraines    OA (osteoarthritis)    PONV (postoperative nausea and vomiting)    Reflux    Past Surgical History:  Procedure Laterality Date   ABDOMINAL EXPOSURE N/A 11/24/2016   Procedure: ABDOMINAL EXPOSURE;  Surgeon: Rosetta Posner, MD;  Location: [place];  Service: Vascular;  Laterality: N/A;   ABDOMINAL HYSTERECTOMY  1992    age 84 and appendics   ANTERIOR LUMBAR FUSION Bilateral 11/24/2016   Procedure: LUMBAR 5-SACRUM 1 ANTERIOR LUMBAR INTERBODY FUSION;  Surgeon: Phylliss Bob, MD;  Location: [place];  Service: Orthopedics;  Laterality: Bilateral;  LUMBAR 5-SACRUM 1 ANTERIOR LUMBAR INTERBODY FUSION    CHOLECYSTECTOMY  2007   COLON RESECTION  2006   COLONOSCOPY  2019   HAND SURGERY  2010   repair of incisional hernia  2012   SHOULDER ARTHROSCOPY Left 2009   SHOULDER SURGERY Right 1997   rotator cuff   TONSILLECTOMY     as a child   TOTAL KNEE ARTHROPLASTY Left 08/11/2021   Procedure: TOTAL KNEE ARTHROPLASTY;  Surgeon: Renette Butters, MD;  Location[facility];  Service: Orthopedics;  Laterality: Left;   Allergies  Allergen Reactions   Benadryl [Diphenhydramine Hcl] Nausea And Vomiting and Other (See Comments)    Migraine headaches.   Lansoprazole Nausea And Vomiting and Other (See Comments)    Migraine headache   Morphine Sulfate Nausea And Vomiting and Other (See Comments)    Migraine headaches.   Penicillins Nausea And Vomiting and Other (See Comments)    INTOLERANCE >  NAUSEA & VOMITING YEAST INFECTION > CHANGE IN NORMAL FLORA  Has patient had a PCN reaction causing immediate rash, facial/tongue/throat swelling, SOB or lightheadedness with hypotension:No Has patient had a PCN reaction causing severe rash involving mucus membranes or skin necrosis: No Has patient had a PCN reaction that required hospitalization No Has patient had a PCN reaction occurring within the last 10  years:No    Red Dye Other (See Comments)    Also allergic to [facility] Dye-migraine headaches.   Simvastatin Nausea And Vomiting and Other (See Comments)    Migraine    Codeine Nausea And Vomiting   Shellfish Allergy Other (See Comments)    Congestion   Spiriva Respimat [Tiotropium Bromide Monohydrate] Cough   Sulfa Antibiotics Nausea And Vomiting   Prior to Admission medications   Medication Sig Start Date End Date Taking?  Authorizing Provider  acetaminophen (TYLENOL) 500 MG tablet Take 2 tablets (1,000 mg total) by mouth every 6 (six) hours as needed for mild pain or moderate pain. 08/11/21   Britt Bottom, PA-C  amitriptyline (ELAVIL) 100 MG tablet Take 100 mg by mouth at bedtime.    [provider]  ARIPiprazole (ABILIFY) 15 MG tablet Take 15 mg by mouth daily. 02/12/20   [provider]  calcium carbonate (TUMS - DOSED IN MG ELEMENTAL CALCIUM) 500 MG chewable tablet Chew 2 tablets by mouth 4 (four) times daily as needed for indigestion or heartburn.    [provider]  Cholecalciferol (VITAMIN D) 2000 units CAPS Take 2,000 Units by mouth at bedtime.    [provider]  Cyanocobalamin (VITAMIN B-12) 5000 MCG TBDP Take 5,000 mcg by mouth at bedtime.    [provider]  gabapentin (NEURONTIN) 100 MG capsule Take 1 capsule (100 mg total) by mouth 3 (three) times daily. 12/17/21 07/15/22  Alric Ran, MD  losartan (COZAAR) 25 MG tablet Take 1 tablet (25 mg total) by mouth daily. 10/24/20 02/11/22  Geralynn Rile, MD  metFORMIN (GLUCOPHAGE-XR) 500 MG 24 hr tablet Take 1,000 mg by mouth every evening. 07/31/20   [provider]  omeprazole (PRILOSEC OTC) 20 MG tablet Take 1 tablet (20 mg total) by mouth daily. For gastric protection 08/11/21 02/11/22  Aggie Moats M, PA-C  PARoxetine (PAXIL) 20 MG tablet Take 20 mg by mouth daily. 02/11/20   [provider]  rosuvastatin (CRESTOR) 20 MG tablet Take 1 tablet (20 mg total) by mouth at bedtime. 01/19/22   O'Neal, Cassie Freer, MD   Social History   Socioeconomic History   Marital status: Soil scientist    Spouse name: Not on file   Number of children: 0   Years of education: Not on file   Highest education level: Some college, no degree  Occupational History   Occupation: pet care  Tobacco Use   Smoking status: Every Day    Packs/day: 0.50    Years: 50.00    Total pack years: 25.00    Types:  Cigarettes, E-cigarettes    Start date: 1971   Smokeless tobacco: Never   Tobacco comments:    0.5 ppd of cigerettes , using e cigarette   Vaping Use   Vaping Use: Every day   Substances: Nicotine, Flavoring  Substance and Sexual Activity   Alcohol use: No    Comment: recovering  alcoholic   Drug use: No   Sexual activity: Not on file  Other Topics Concern   Not on file  Social History Narrative   Caffeine 3 cups per day    Lives with Partner [name]   Social Determinants of Health   Financial Resource Strain: Not on file  Food Insecurity: Not on file  Transportation Needs: Not on file  Physical Activity: Not on file  Stress: Not on file  Social Connections: Not on file   Family History  Problem Relation Age of Onset  Heart disease Mother        Previous MI at age 85-55   Atrial fibrillation Mother    Stroke Mother    Heart failure Mother    Seizures Father        poss d/t alcohol   Cancer Father 69       Prostate cancer    Diabetes Sister    Arrhythmia Sister    Cancer Maternal Aunt        lung cancer    Cancer Maternal Uncle        kidney ca    ROS: Currently denies lightheadedness, dizziness, Fever, chills, CP, SOB.   No personal history of DVT, PE, MI, or CVA. No loose teeth. + partial dentures present All other systems have been reviewed and were otherwise currently negative with the exception of those mentioned in the HPI and as above.  Objective: Vitals: Ht: 5'7" Wt: 180 lbs Temp: 98.0 BP: 148/87 Pulse: 88 O2 93% on room air.   Physical Exam: General: Alert, NAD.  Antalgic Gait  HEENT: EOMI, Good Neck Extension  Pulm: No increased work of breathing. B/L inspiratory wheezing in all lung fields.   CV: RRR, No m/g/r appreciated  GI: soft, NT, ND. BS x 4 quadrants Neuro: CN II-XII grossly intact without focal deficit.  Sensation intact distally Skin: No lesions in the area of chief complaint MSK/Surgical Site:  + JLT. ROM 10-100 degrees.  5/5 strength  in extension and flexion.  +EHL/FHL.  NVI.  Pain and instability with varus and valgus stress.    Imaging Review Plain radiographs demonstrate severe degenerative joint disease of the right knee.   The overall alignment isneutral. The bone quality appears to be fair for age and reported activity level.  Preoperative templating of the joint replacement has been completed, documented, and submitted to the Operating Room personnel in order to optimize intra-operative equipment management.  Assessment: OA RIGHT KNEE Active Problems:   * No active hospital problems. *   Plan: Plan for Procedure(s): TOTAL KNEE ARTHROPLASTY  The patient history, physical exam, clinical judgement of the provider and imaging are consistent with end stage degenerative joint disease and total joint arthroplasty is deemed medically necessary. The treatment options including medical management, injection therapy, and arthroplasty were discussed at length. The risks and benefits of Procedure(s): TOTAL KNEE ARTHROPLASTY were presented and reviewed.  The risks of nonoperative treatment, versus surgical intervention including but not limited to continued pain, aseptic loosening, stiffness, dislocation/subluxation, infection, bleeding, nerve injury, blood clots, cardiopulmonary complications, morbidity, mortality, among others were discussed. The patient verbalizes understanding and wishes to proceed with the plan.  Patient is being admitted for inpatient treatment for surgery, pain control, PT, prophylactic antibiotics, VTE prophylaxis, progressive ambulation, ADL's and discharge planning. She will spend the night in observation.  Dental prophylaxis discussed and recommended for 2 years postoperatively.  The patient does meet the criteria for TXA which will be used perioperatively.   ASA 81 mg BID will be used postoperatively for DVT prophylaxis in addition to SCDs, and early ambulation. Plan for Tylenol, Mobic,  oxycodone for pain.   Robaxin for muscle spasms.   Zofran for nausea and vomiting. Sennakot for constipation prevention [place] The patient is planning to be discharged home with OPPT and into the care of her partner [name] who can be reached at [phone] Follow up appt 07/14/22 at 4:15pm     Alisa Graff Office [phone] 06/14/2022 4:04 PM

## 2022-06-14 NOTE — Patient Instructions (Signed)
SURGICAL WAITING ROOM VISITATION Patients having surgery or a procedure may have no more than 2 support people in the waiting area - these visitors may rotate.   Children under the age of 83 must have an adult with them who is not the patient. If the patient needs to stay at the hospital during part of their recovery, the visitor guidelines for inpatient rooms apply. Pre-op nurse will coordinate an appropriate time for 1 support person to accompany patient in pre-op.  This support person may not rotate.    Please refer to the [facility] website for the visitor guidelines for Inpatients (after your surgery is over and you are in a regular room).       Your procedure is scheduled on:  06/29/22    Report to [facility] Main Entrance    Report to admitting at   [place] AM   Call this number if you have problems the morning of surgery [phone]   Do not eat food :After Midnight.   After Midnight you may have the following liquids until ____ 0430__ AM DAY OF SURGERY  Water Non-Citrus Juices (without pulp, NO RED) Carbonated Beverages Black Coffee (NO MILK/CREAM OR CREAMERS, sugar ok)  Clear Tea (NO MILK/CREAM OR CREAMERS, sugar ok) regular and decaf                             Plain Jell-O (NO RED)                                           Fruit ices (not with fruit pulp, NO RED)                                     Popsicles (NO RED)                                                               Sports drinks like Gatorade (NO RED)                    The day of surgery:  Drink ONE (1) Pre-Surgery Clear Ensure or G2 at  0430AM  ( have completed by )  the morning of surgery. Drink in one sitting. Do not sip.  This drink was given to you during your hospital  pre-op appointment visit. Nothing else to drink after completing the  Pre-Surgery Clear Ensure or G2.          If you have questions, please contact your surgeon's office.        Oral Hygiene is also important to  reduce your risk of infection.                                    Remember - BRUSH YOUR TEETH THE MORNING OF SURGERY WITH YOUR REGULAR TOOTHPASTE   Do NOT smoke after Midnight   Take these medicines the morning of surgery with A SIP OF WATER:  abilify, gabapentin, omeprazole, paxil  DO NOT TAKE ANY ORAL DIABETIC MEDICATIONS DAY OF YOUR SURGERY  Bring CPAP mask and tubing day of surgery.                              You may not have any metal on your body including hair pins, jewelry, and body piercing             Do not wear make-up, lotions, powders, perfumes/cologne, or deodorant  Do not wear nail polish including gel and S&S, artificial/acrylic nails, or any other type of covering on natural nails including finger and toenails. If you have artificial nails, gel coating, etc. that needs to be removed by a nail salon please have this removed prior to surgery or surgery may need to be canceled/ delayed if the surgeon/ anesthesia feels like they are unable to be safely monitored.   Do not shave  48 hours prior to surgery.               Men may shave face and neck.   Do not bring valuables to the hospital. [place].   Contacts, dentures or bridgework may not be worn into surgery.   Bring small overnight bag day of surgery.   DO NOT [place]. PHARMACY WILL DISPENSE MEDICATIONS LISTED ON YOUR MEDICATION LIST TO YOU DURING YOUR ADMISSION [place]!    Patients discharged on the day of surgery will not be allowed to drive home.  Someone NEEDS to stay with you for the first 24 hours after anesthesia.   Special Instructions: Bring a copy of your healthcare power of attorney and living will documents the day of surgery if you haven't scanned them before.              Please read over the following fact sheets you were given: IF [place]  [phone]   If you received a COVID test during your pre-op visit  it is requested that you wear a mask when out in public, stay away from anyone that may not be feeling well and notify your surgeon if you develop symptoms. If you test positive for Covid or have been in contact with anyone that has tested positive in the last 10 days please notify you surgeon.    [place] - Preparing for Surgery Before surgery, you can play an important role.  Because skin is not sterile, your skin needs to be as free of germs as possible.  You can reduce the number of germs on your skin by washing with CHG (chlorahexidine gluconate) soap before surgery.  CHG is an antiseptic cleaner which kills germs and bonds with the skin to continue killing germs even after washing. Please DO NOT use if you have an allergy to CHG or antibacterial soaps.  If your skin becomes reddened/irritated stop using the CHG and inform your nurse when you arrive at Short Stay. Do not shave (including legs and underarms) for at least 48 hours prior to the first CHG shower.  You may shave your face/neck. Please follow these instructions carefully:  1.  Shower with CHG Soap the night before surgery and the  morning of Surgery.  2.  If you choose to wash your hair, wash your hair first as usual with  your  normal  shampoo.  3.  After you shampoo, rinse your hair and body thoroughly to remove the  shampoo.                           4.  Use CHG as you would any other liquid soap.  You can apply chg directly  to the skin and wash                       Gently with a scrungie or clean washcloth.  5.  Apply the CHG Soap to your body ONLY FROM THE NECK DOWN.   Do not use on face/ open                           Wound or open sores. Avoid contact with eyes, ears mouth and genitals (private parts).                       Wash face,  Genitals (private parts) with your normal soap.             6.  Wash thoroughly, paying special attention to the area  where your surgery  will be performed.  7.  Thoroughly rinse your body with warm water from the neck down.  8.  DO NOT shower/wash with your normal soap after using and rinsing off  the CHG Soap.                9.  Pat yourself dry with a clean towel.            10.  Wear clean pajamas.            11.  Place clean sheets on your bed the night of your first shower and do not  sleep with pets. Day of Surgery : Do not apply any lotions/deodorants the morning of surgery.  Please wear clean clothes to the hospital/surgery center.  FAILURE TO FOLLOW THESE INSTRUCTIONS MAY RESULT IN THE CANCELLATION OF YOUR SURGERY PATIENT SIGNATURE_________________________________  NURSE SIGNATURE__________________________________  ________________________________________________________________________

## 2022-06-16 ENCOUNTER — Other Ambulatory Visit: Payer: Self-pay

## 2022-06-16 ENCOUNTER — Encounter (HOSPITAL_COMMUNITY)
Admission: RE | Admit: 2022-06-16 | Discharge: 2022-06-16 | Disposition: A | Discharge status: Home / Self Care | Payer: Medicare Other | Source: Ambulatory Visit | Attending: Orthopedic Surgery | Admitting: Orthopedic Surgery

## 2022-06-16 ENCOUNTER — Encounter (HOSPITAL_COMMUNITY): Payer: Self-pay

## 2022-06-16 VITALS — BP 154/83 | HR 75 | Temp 98.7°F | Resp 16 | Ht 65.0 in | Wt 179.0 lb

## 2022-06-16 DIAGNOSIS — M1711 Unilateral primary osteoarthritis, right knee: Secondary | ICD-10-CM | POA: Insufficient documentation

## 2022-06-16 DIAGNOSIS — F17210 Nicotine dependence, cigarettes, uncomplicated: Secondary | ICD-10-CM | POA: Diagnosis not present

## 2022-06-16 DIAGNOSIS — Z01812 Encounter for preprocedural laboratory examination: Secondary | ICD-10-CM | POA: Insufficient documentation

## 2022-06-16 DIAGNOSIS — Z01818 Encounter for other preprocedural examination: Secondary | ICD-10-CM

## 2022-06-16 HISTORY — DX: Anxiety disorder, unspecified: F41.9

## 2022-06-16 LAB — BASIC METABOLIC PANEL
Anion gap: 9 (ref 5–15)
BUN: 11 mg/dL (ref 8–23)
CO2: 29 mmol/L (ref 22–32)
Calcium: 9.3 mg/dL (ref 8.9–10.3)
Chloride: 101 mmol/L (ref 98–111)
Creatinine, Ser: 0.61 mg/dL (ref 0.44–1.00)
GFR, Estimated: >60 mL/min (ref >60)
Glucose, Bld: 215 mg/dL — ABNORMAL HIGH (ref 70–99)
Potassium: 4 mmol/L (ref 3.5–5.1)
Sodium: 139 mmol/L (ref 135–145)

## 2022-06-16 LAB — CBC
HCT: 42.3 % (ref 36.0–46.0)
Hemoglobin: 13.9 g/dL (ref 12.0–15.0)
MCH: 32 pg (ref 26.0–34.0)
MCHC: 32.9 g/dL (ref 30.0–36.0)
MCV: 97.2 fL (ref 80.0–100.0)
Platelets: 207 10*3/uL (ref 150–400)
RBC: 4.35 MIL/uL (ref 3.87–5.11)
RDW: 12.9 % (ref 11.5–15.5)
WBC: 9.6 10*3/uL (ref 4.0–10.5)
nRBC: 0 % (ref 0.0–0.2)

## 2022-06-16 LAB — GLUCOSE, CAPILLARY: Glucose-Capillary: 227 mg/dL — ABNORMAL HIGH (ref 70–99)

## 2022-06-16 LAB — HEMOGLOBIN A1C
Hgb A1c MFr Bld: 6.8 % — ABNORMAL HIGH (ref 4.8–5.6)
Mean Plasma Glucose: 148.46 mg/dL

## 2022-06-17 LAB — SURGICAL PCR SCREEN
MRSA, PCR: POSITIVE — AB
Staphylococcus aureus: POSITIVE — AB

## 2022-06-17 NOTE — Progress Notes (Signed)
Anesthesia Chart Review   Case: [id number] Date/Time: 06/29/22 0715   Procedure: TOTAL KNEE ARTHROPLASTY (Right: Knee)   Anesthesia type: Choice   Pre-op diagnosis: OA RIGHT KNEE   Location: [facility] ROOM 08 / [facility]   Surgeons: Renette Butters, MD       DISCUSSION:71 y.o. smoker with h/o PONV, HTN, DM II, COPD, h/o alcohol abuse, right knee OA scheduled for above procedure 06/29/2022 with Dr. Edmonia Lynch.   Pt last seen by cardiology 02/11/2022. Per OV note, "Preoperative cardiovascular examination -RCRI equals 0.  Very low perioperative risk for major cardiovascular event.  Risk is 0.4%.  She has no history of MI, CHF, diabetes on insulin, CKD, stroke.  She can complete greater than 4 METS.  Would recommend no further testing.  Echocardiogram was normal.  EKG shows single PACs.  No symptoms from this.  Does not need further testing.  Thyroid studies are normal lately.  Echo was also normal."  Anticipate pt can proceed with planned procedure barring acute status change.   VS: BP (!) 154/83   Pulse 75   Temp 37.1 C (Oral)   Resp 16   Ht '5\' 5"'$  (1.651 m)   Wt 81.2 kg   SpO2 96%   BMI 29.79 kg/m   PROVIDERS: Jonathon Jordan, MD is PCP   Cardiologist:  Evalina Field, MD   LABS: Labs reviewed: Acceptable for surgery. (all labs ordered are listed, but only abnormal results are displayed)  Labs Reviewed  SURGICAL PCR SCREEN - Abnormal; Notable for the following components:      Result Value   MRSA, PCR POSITIVE (*)    Staphylococcus aureus POSITIVE (*)    All other components within normal limits  HEMOGLOBIN A1C - Abnormal; Notable for the following components:   Hgb A1c MFr Bld 6.8 (*)    All other components within normal limits  BASIC METABOLIC PANEL - Abnormal; Notable for the following components:   Glucose, Bld 215 (*)    All other components within normal limits  GLUCOSE, CAPILLARY - Abnormal; Notable for the following components:   Glucose-Capillary 227 (*)    All  other components within normal limits  CBC     IMAGES:   EKG:   CV: Echo 11/26/2020 1. Left ventricular ejection fraction, by estimation, is 60 to 65%. The  left ventricle has normal function. The left ventricle has no regional  wall motion abnormalities. There is mild left ventricular hypertrophy.  Left ventricular diastolic parameters  are consistent with Grade I diastolic dysfunction (impaired relaxation).   2. Right ventricular systolic function is normal. The right ventricular  size is normal. Tricuspid regurgitation signal is inadequate for assessing  PA pressure.   3. Left atrial size was mildly dilated by visual estimate.   4. The mitral valve is grossly normal. Trivial mitral valve  regurgitation. No evidence of mitral stenosis.   5. The aortic valve is grossly normal. There is mild calcification of the  aortic valve. There is mild thickening of the aortic valve. Aortic valve  regurgitation is trivial. No aortic stenosis is present.   6. The inferior vena cava is normal in size with greater than 50%  respiratory variability, suggesting right atrial pressure of 3 mmHg.  Past Medical History:  Diagnosis Date   Alcohol abuse, in remission    Anxiety    Cancer of sigmoid colon ([place]) 07/2005   T2N0M0   Chronic bronchitis    COPD (chronic obstructive pulmonary disease) ([place])  Depression    Diabetes mellitus without complication (HCC)    GERD (gastroesophageal reflux disease)    History of thyroid nodule    monitoring   Hyperlipidemia    Hypertension    Migraines    OA (osteoarthritis)    PONV (postoperative nausea and vomiting)    Reflux     Past Surgical History:  Procedure Laterality Date   ABDOMINAL EXPOSURE N/A 11/24/2016   Procedure: ABDOMINAL EXPOSURE;  Surgeon: Rosetta Posner, MD;  Location: [place];  Service: Vascular;  Laterality: N/A;   ABDOMINAL HYSTERECTOMY  1992   age 55 and appendics   ANTERIOR LUMBAR FUSION Bilateral 11/24/2016   Procedure:  LUMBAR 5-SACRUM 1 ANTERIOR LUMBAR INTERBODY FUSION;  Surgeon: Phylliss Bob, MD;  Location: [place];  Service: Orthopedics;  Laterality: Bilateral;  LUMBAR 5-SACRUM 1 ANTERIOR LUMBAR INTERBODY FUSION    bilateral breast surgery      benign tumors removed   CHOLECYSTECTOMY  2007   COLON RESECTION  2006   COLONOSCOPY  2019   HAND SURGERY  2010   repair of incisional hernia  2012   SHOULDER ARTHROSCOPY Left 2009   SHOULDER SURGERY Right 1997   rotator cuff   TONSILLECTOMY     as a child   TOTAL KNEE ARTHROPLASTY Left 08/11/2021   Procedure: TOTAL KNEE ARTHROPLASTY;  Surgeon: Renette Butters, MD;  Location: [facility];  Service: Orthopedics;  Laterality: Left;    MEDICATIONS:  acetaminophen (TYLENOL) 500 MG tablet   amitriptyline (ELAVIL) 25 MG tablet   ARIPiprazole (ABILIFY) 15 MG tablet   calcium carbonate (TUMS - DOSED IN MG ELEMENTAL CALCIUM) 500 MG chewable tablet   Cholecalciferol (VITAMIN D) 2000 units CAPS   Cyanocobalamin (VITAMIN B-12) 5000 MCG TBDP   famotidine (PEPCID) 20 MG tablet   gabapentin (NEURONTIN) 100 MG capsule   losartan (COZAAR) 25 MG tablet   meloxicam (MOBIC) 15 MG tablet   metFORMIN (GLUCOPHAGE-XR) 500 MG 24 hr tablet   omeprazole (PRILOSEC OTC) 20 MG tablet   PARoxetine (PAXIL) 30 MG tablet   rosuvastatin (CRESTOR) 20 MG tablet   No current facility-administered medications for this encounter.   Konrad Felix Ward, PA-C [facility] Pre-Surgical Testing [phone]

## 2022-06-29 ENCOUNTER — Encounter (HOSPITAL_COMMUNITY): Payer: Self-pay | Admitting: Orthopedic Surgery

## 2022-06-29 ENCOUNTER — Other Ambulatory Visit: Payer: Self-pay

## 2022-06-29 ENCOUNTER — Observation Stay (HOSPITAL_COMMUNITY)
Admission: RE | Admit: 2022-06-29 | Discharge: 2022-06-30 | Disposition: A | Discharge status: Home / Self Care | Payer: Medicare Other | Attending: Orthopedic Surgery | Admitting: Orthopedic Surgery

## 2022-06-29 ENCOUNTER — Ambulatory Visit (HOSPITAL_COMMUNITY): Payer: Medicare Other | Admitting: Physician Assistant

## 2022-06-29 ENCOUNTER — Ambulatory Visit (HOSPITAL_BASED_OUTPATIENT_CLINIC_OR_DEPARTMENT_OTHER): Payer: Medicare Other | Admitting: Anesthesiology

## 2022-06-29 ENCOUNTER — Ambulatory Visit (HOSPITAL_COMMUNITY): Payer: Medicare Other

## 2022-06-29 ENCOUNTER — Encounter (HOSPITAL_COMMUNITY)
Admission: RE | Disposition: A | Discharge status: Home / Self Care | Payer: Self-pay | Source: Home / Self Care | Attending: Orthopedic Surgery

## 2022-06-29 DIAGNOSIS — E119 Type 2 diabetes mellitus without complications: Secondary | ICD-10-CM | POA: Insufficient documentation

## 2022-06-29 DIAGNOSIS — Z96651 Presence of right artificial knee joint: Secondary | ICD-10-CM | POA: Diagnosis not present

## 2022-06-29 DIAGNOSIS — Z471 Aftercare following joint replacement surgery: Secondary | ICD-10-CM | POA: Diagnosis not present

## 2022-06-29 DIAGNOSIS — M25461 Effusion, right knee: Secondary | ICD-10-CM | POA: Diagnosis not present

## 2022-06-29 DIAGNOSIS — J449 Chronic obstructive pulmonary disease, unspecified: Secondary | ICD-10-CM | POA: Insufficient documentation

## 2022-06-29 DIAGNOSIS — I10 Essential (primary) hypertension: Secondary | ICD-10-CM

## 2022-06-29 DIAGNOSIS — Z96652 Presence of left artificial knee joint: Secondary | ICD-10-CM | POA: Diagnosis not present

## 2022-06-29 DIAGNOSIS — Z79899 Other long term (current) drug therapy: Secondary | ICD-10-CM | POA: Diagnosis not present

## 2022-06-29 DIAGNOSIS — F17210 Nicotine dependence, cigarettes, uncomplicated: Secondary | ICD-10-CM | POA: Insufficient documentation

## 2022-06-29 DIAGNOSIS — M1711 Unilateral primary osteoarthritis, right knee: Principal | ICD-10-CM | POA: Insufficient documentation

## 2022-06-29 DIAGNOSIS — F17200 Nicotine dependence, unspecified, uncomplicated: Secondary | ICD-10-CM | POA: Diagnosis not present

## 2022-06-29 DIAGNOSIS — Z7984 Long term (current) use of oral hypoglycemic drugs: Secondary | ICD-10-CM | POA: Insufficient documentation

## 2022-06-29 DIAGNOSIS — Z85038 Personal history of other malignant neoplasm of large intestine: Secondary | ICD-10-CM | POA: Diagnosis not present

## 2022-06-29 DIAGNOSIS — Z01818 Encounter for other preprocedural examination: Secondary | ICD-10-CM

## 2022-06-29 HISTORY — PX: TOTAL KNEE ARTHROPLASTY: SHX125

## 2022-06-29 LAB — GLUCOSE, CAPILLARY
Glucose-Capillary: 147 mg/dL — ABNORMAL HIGH (ref 70–99)
Glucose-Capillary: 145 mg/dL — ABNORMAL HIGH (ref 70–99)

## 2022-06-29 SURGERY — ARTHROPLASTY, KNEE, TOTAL
Anesthesia: Regional | Site: Knee | Laterality: Right

## 2022-06-29 MED ORDER — LACTATED RINGERS IV BOLUS
500.0000 mL | Freq: Once | INTRAVENOUS | Status: AC
  Administered 2022-06-29: 500 mL via INTRAVENOUS

## 2022-06-29 MED ORDER — CHLORHEXIDINE GLUCONATE 0.12 % MT SOLN
15.0000 mL | Freq: Once | OROMUCOSAL | Status: AC
  Administered 2022-06-29: 15 mL via OROMUCOSAL

## 2022-06-29 MED ORDER — ASPIRIN 81 MG PO TBEC: 81.0000 mg | DELAYED_RELEASE_TABLET | Freq: Two times a day (BID) | ORAL | 0 refills | Status: DC

## 2022-06-29 MED ORDER — METFORMIN HCL 500 MG PO TABS: 1000.0000 mg | ORAL_TABLET | Freq: Every day | ORAL | Status: DC

## 2022-06-29 MED ORDER — ONDANSETRON 4 MG PO TBDP: 4.0000 mg | ORAL_TABLET | Freq: Two times a day (BID) | ORAL | 0 refills | Status: DC | PRN

## 2022-06-29 MED ORDER — LOSARTAN POTASSIUM 25 MG PO TABS
25.0000 mg | ORAL_TABLET | Freq: Every evening | ORAL | Status: DC
  Administered 2022-06-29: 25 mg via ORAL
  Filled 2022-06-29: qty 1

## 2022-06-29 MED ORDER — OXYCODONE HCL 5 MG PO TABS
ORAL_TABLET | ORAL | Status: AC
  Administered 2022-06-29: 10 mg via ORAL
  Filled 2022-06-29: qty 2

## 2022-06-29 MED ORDER — BUPIVACAINE LIPOSOME 1.3 % IJ SUSP
INTRAMUSCULAR | Status: AC
  Filled 2022-06-29: qty 20

## 2022-06-29 MED ORDER — TRANEXAMIC ACID-NACL 1000-0.7 MG/100ML-% IV SOLN: 1000.0000 mg | Freq: Once | INTRAVENOUS | Status: DC

## 2022-06-29 MED ORDER — ACETAMINOPHEN 500 MG PO TABS
1000.0000 mg | ORAL_TABLET | Freq: Four times a day (QID) | ORAL | Status: AC
  Administered 2022-06-29 – 2022-06-30 (×2): 1000 mg via ORAL
  Filled 2022-06-29 (×2): qty 2

## 2022-06-29 MED ORDER — VANCOMYCIN HCL IN DEXTROSE 1-5 GM/200ML-% IV SOLN
1000.0000 mg | INTRAVENOUS | Status: AC
  Administered 2022-06-29: 1000 mg via INTRAVENOUS
  Filled 2022-06-29: qty 200

## 2022-06-29 MED ORDER — POLYETHYLENE GLYCOL 3350 17 G PO PACK: 17.0000 g | PACK | Freq: Every day | ORAL | Status: DC | PRN

## 2022-06-29 MED ORDER — TRANEXAMIC ACID-NACL 1000-0.7 MG/100ML-% IV SOLN
INTRAVENOUS | Status: AC
  Filled 2022-06-29: qty 100

## 2022-06-29 MED ORDER — MAGNESIUM CITRATE PO SOLN: 1.0000 | Freq: Once | ORAL | Status: DC | PRN

## 2022-06-29 MED ORDER — SODIUM CHLORIDE 0.9 % IR SOLN
Status: DC | PRN
  Administered 2022-06-29: 1000 mL

## 2022-06-29 MED ORDER — TRANEXAMIC ACID-NACL 1000-0.7 MG/100ML-% IV SOLN
1000.0000 mg | INTRAVENOUS | Status: DC
  Filled 2022-06-29: qty 100

## 2022-06-29 MED ORDER — 0.9 % SODIUM CHLORIDE (POUR BTL) OPTIME
TOPICAL | Status: DC | PRN
  Administered 2022-06-29: 1000 mL

## 2022-06-29 MED ORDER — PROPOFOL 10 MG/ML IV BOLUS
INTRAVENOUS | Status: DC | PRN
  Administered 2022-06-29: 20 mg via INTRAVENOUS

## 2022-06-29 MED ORDER — PHENOL 1.4 % MT LIQD: 1.0000 | OROMUCOSAL | Status: DC | PRN

## 2022-06-29 MED ORDER — PROPOFOL 1000 MG/100ML IV EMUL
INTRAVENOUS | Status: AC
  Filled 2022-06-29: qty 100

## 2022-06-29 MED ORDER — DEXAMETHASONE SODIUM PHOSPHATE 10 MG/ML IJ SOLN
10.0000 mg | Freq: Once | INTRAMUSCULAR | Status: AC
  Administered 2022-06-30: 10 mg via INTRAVENOUS
  Filled 2022-06-29: qty 1

## 2022-06-29 MED ORDER — VANCOMYCIN HCL IN DEXTROSE 1-5 GM/200ML-% IV SOLN
1000.0000 mg | Freq: Two times a day (BID) | INTRAVENOUS | Status: AC
  Administered 2022-06-29: 1000 mg via INTRAVENOUS
  Filled 2022-06-29: qty 200

## 2022-06-29 MED ORDER — MIDAZOLAM HCL 2 MG/2ML IJ SOLN
INTRAMUSCULAR | Status: AC
  Filled 2022-06-29: qty 2

## 2022-06-29 MED ORDER — PHENYLEPHRINE HCL (PRESSORS) 10 MG/ML IV SOLN
INTRAVENOUS | Status: AC
  Filled 2022-06-29: qty 1

## 2022-06-29 MED ORDER — METHOCARBAMOL 500 MG IVPB - SIMPLE MED
INTRAVENOUS | Status: AC
  Administered 2022-06-29: 500 mg via INTRAVENOUS
  Filled 2022-06-29: qty 55

## 2022-06-29 MED ORDER — METOCLOPRAMIDE HCL 5 MG PO TABS: 5.0000 mg | ORAL_TABLET | Freq: Three times a day (TID) | ORAL | Status: DC | PRN

## 2022-06-29 MED ORDER — ALUM & MAG HYDROXIDE-SIMETH 200-200-20 MG/5ML PO SUSP: 30.0000 mL | ORAL | Status: DC | PRN

## 2022-06-29 MED ORDER — AMISULPRIDE (ANTIEMETIC) 5 MG/2ML IV SOLN: 10.0000 mg | Freq: Once | INTRAVENOUS | Status: DC | PRN

## 2022-06-29 MED ORDER — ACETAMINOPHEN 500 MG PO TABS
ORAL_TABLET | ORAL | Status: AC
  Administered 2022-06-29: 1000 mg via ORAL
  Filled 2022-06-29: qty 2

## 2022-06-29 MED ORDER — BUPIVACAINE LIPOSOME 1.3 % IJ SUSP: 20.0000 mL | Freq: Once | INTRAMUSCULAR | Status: DC

## 2022-06-29 MED ORDER — POVIDONE-IODINE 10 % EX SWAB: 2.0000 | Freq: Once | CUTANEOUS | Status: DC

## 2022-06-29 MED ORDER — PANTOPRAZOLE SODIUM 40 MG PO TBEC
40.0000 mg | DELAYED_RELEASE_TABLET | Freq: Every day | ORAL | Status: DC
  Administered 2022-06-29 – 2022-06-30 (×2): 40 mg via ORAL
  Filled 2022-06-29 (×2): qty 1

## 2022-06-29 MED ORDER — ROSUVASTATIN CALCIUM 20 MG PO TABS
20.0000 mg | ORAL_TABLET | Freq: Every day | ORAL | Status: DC
  Administered 2022-06-29: 20 mg via ORAL
  Filled 2022-06-29: qty 1

## 2022-06-29 MED ORDER — MIDAZOLAM HCL 5 MG/5ML IJ SOLN
INTRAMUSCULAR | Status: DC | PRN
  Administered 2022-06-29: 2 mg via INTRAVENOUS

## 2022-06-29 MED ORDER — BUPIVACAINE-EPINEPHRINE (PF) 0.5% -1:200000 IJ SOLN
INTRAMUSCULAR | Status: AC
  Filled 2022-06-29: qty 30

## 2022-06-29 MED ORDER — ONDANSETRON HCL 4 MG/2ML IJ SOLN: 4.0000 mg | Freq: Four times a day (QID) | INTRAMUSCULAR | Status: DC | PRN

## 2022-06-29 MED ORDER — LACTATED RINGERS IV SOLN: INTRAVENOUS | Status: DC

## 2022-06-29 MED ORDER — LACTATED RINGERS IV BOLUS: 250.0000 mL | Freq: Once | INTRAVENOUS | Status: DC

## 2022-06-29 MED ORDER — ACETAMINOPHEN 325 MG PO TABS: 325.0000 mg | ORAL_TABLET | Freq: Four times a day (QID) | ORAL | Status: DC | PRN

## 2022-06-29 MED ORDER — ONDANSETRON HCL 4 MG/2ML IJ SOLN: 4.0000 mg | Freq: Once | INTRAMUSCULAR | Status: DC | PRN

## 2022-06-29 MED ORDER — PAROXETINE HCL 20 MG PO TABS
30.0000 mg | ORAL_TABLET | Freq: Every day | ORAL | Status: DC
  Administered 2022-06-29 – 2022-06-30 (×2): 30 mg via ORAL
  Filled 2022-06-29 (×2): qty 1

## 2022-06-29 MED ORDER — ONDANSETRON HCL 4 MG/2ML IJ SOLN
INTRAMUSCULAR | Status: DC | PRN
  Administered 2022-06-29: 4 mg via INTRAVENOUS

## 2022-06-29 MED ORDER — FENTANYL CITRATE (PF) 100 MCG/2ML IJ SOLN
INTRAMUSCULAR | Status: DC | PRN
  Administered 2022-06-29 (×2): 50 ug via INTRAVENOUS

## 2022-06-29 MED ORDER — FENTANYL CITRATE (PF) 100 MCG/2ML IJ SOLN
INTRAMUSCULAR | Status: AC
  Filled 2022-06-29: qty 2

## 2022-06-29 MED ORDER — BISACODYL 10 MG RE SUPP: 10.0000 mg | Freq: Every day | RECTAL | Status: DC | PRN

## 2022-06-29 MED ORDER — DOCUSATE SODIUM 100 MG PO CAPS: 100.0000 mg | ORAL_CAPSULE | Freq: Two times a day (BID) | ORAL | Status: DC

## 2022-06-29 MED ORDER — SODIUM CHLORIDE 0.9 % IV SOLN
INTRAVENOUS | Status: DC | PRN
  Administered 2022-06-29: 70 mL

## 2022-06-29 MED ORDER — IPRATROPIUM-ALBUTEROL 0.5-2.5 (3) MG/3ML IN SOLN
RESPIRATORY_TRACT | Status: AC
  Administered 2022-06-29: 3 mL via RESPIRATORY_TRACT
  Filled 2022-06-29: qty 3

## 2022-06-29 MED ORDER — ONDANSETRON HCL 4 MG PO TABS: 4.0000 mg | ORAL_TABLET | Freq: Four times a day (QID) | ORAL | Status: DC | PRN

## 2022-06-29 MED ORDER — METHOCARBAMOL 500 MG PO TABS: 500.0000 mg | ORAL_TABLET | Freq: Four times a day (QID) | ORAL | Status: DC | PRN

## 2022-06-29 MED ORDER — SODIUM CHLORIDE (PF) 0.9 % IJ SOLN
INTRAMUSCULAR | Status: AC
  Filled 2022-06-29: qty 30

## 2022-06-29 MED ORDER — MENTHOL 3 MG MT LOZG: 1.0000 | LOZENGE | OROMUCOSAL | Status: DC | PRN

## 2022-06-29 MED ORDER — OXYCODONE HCL 5 MG PO TABS: 5.0000 mg | ORAL_TABLET | Freq: Four times a day (QID) | ORAL | 0 refills | Status: DC | PRN

## 2022-06-29 MED ORDER — HYDROMORPHONE HCL 1 MG/ML IJ SOLN: 0.5000 mg | INTRAMUSCULAR | Status: DC | PRN

## 2022-06-29 MED ORDER — DEXAMETHASONE SODIUM PHOSPHATE 10 MG/ML IJ SOLN
INTRAMUSCULAR | Status: DC | PRN
  Administered 2022-06-29: 10 mg via INTRAVENOUS

## 2022-06-29 MED ORDER — ACETAMINOPHEN 500 MG PO TABS
1000.0000 mg | ORAL_TABLET | Freq: Once | ORAL | Status: AC
  Administered 2022-06-29: 1000 mg via ORAL
  Filled 2022-06-29: qty 2

## 2022-06-29 MED ORDER — SENNA 8.6 MG PO TABS
1.0000 | ORAL_TABLET | Freq: Every day | ORAL | Status: DC
  Administered 2022-06-29: 8.6 mg via ORAL
  Filled 2022-06-29: qty 1

## 2022-06-29 MED ORDER — DEXAMETHASONE SODIUM PHOSPHATE 10 MG/ML IJ SOLN: 8.0000 mg | Freq: Once | INTRAMUSCULAR | Status: DC

## 2022-06-29 MED ORDER — ORAL CARE MOUTH RINSE: 15.0000 mL | Freq: Once | OROMUCOSAL | Status: AC

## 2022-06-29 MED ORDER — PROPOFOL 500 MG/50ML IV EMUL
INTRAVENOUS | Status: DC | PRN
  Administered 2022-06-29: 75 ug/kg/min via INTRAVENOUS

## 2022-06-29 MED ORDER — METFORMIN HCL ER 500 MG PO TB24
1000.0000 mg | ORAL_TABLET | Freq: Every day | ORAL | Status: DC
  Administered 2022-06-29: 1000 mg via ORAL
  Filled 2022-06-29: qty 2

## 2022-06-29 MED ORDER — IPRATROPIUM-ALBUTEROL 0.5-2.5 (3) MG/3ML IN SOLN: 3.0000 mL | Freq: Once | RESPIRATORY_TRACT | Status: DC

## 2022-06-29 MED ORDER — DEXAMETHASONE SODIUM PHOSPHATE 10 MG/ML IJ SOLN
INTRAMUSCULAR | Status: AC
  Filled 2022-06-29: qty 1

## 2022-06-29 MED ORDER — DIPHENHYDRAMINE HCL 12.5 MG/5ML PO ELIX: 12.5000 mg | ORAL_SOLUTION | ORAL | Status: DC | PRN

## 2022-06-29 MED ORDER — SENNA-DOCUSATE SODIUM 8.6-50 MG PO TABS: 2.0000 | ORAL_TABLET | Freq: Every day | ORAL | 1 refills | Status: DC | PRN

## 2022-06-29 MED ORDER — METOCLOPRAMIDE HCL 5 MG/ML IJ SOLN: 5.0000 mg | Freq: Three times a day (TID) | INTRAMUSCULAR | Status: DC | PRN

## 2022-06-29 MED ORDER — METHOCARBAMOL 750 MG PO TABS: 750.0000 mg | ORAL_TABLET | Freq: Three times a day (TID) | ORAL | 0 refills | Status: DC | PRN

## 2022-06-29 MED ORDER — ONDANSETRON HCL 4 MG/2ML IJ SOLN
INTRAMUSCULAR | Status: AC
  Filled 2022-06-29: qty 2

## 2022-06-29 MED ORDER — IPRATROPIUM-ALBUTEROL 0.5-2.5 (3) MG/3ML IN SOLN: 3.0000 mL | Freq: Once | RESPIRATORY_TRACT | Status: AC

## 2022-06-29 MED ORDER — OXYCODONE HCL 5 MG PO TABS
ORAL_TABLET | ORAL | Status: AC
  Filled 2022-06-29: qty 10

## 2022-06-29 MED ORDER — BUPIVACAINE-EPINEPHRINE (PF) 0.5% -1:200000 IJ SOLN
INTRAMUSCULAR | Status: DC | PRN
  Administered 2022-06-29: 30 mL via PERINEURAL

## 2022-06-29 MED ORDER — ARIPIPRAZOLE 5 MG PO TABS
15.0000 mg | ORAL_TABLET | Freq: Every day | ORAL | Status: DC
  Administered 2022-06-29: 15 mg via ORAL
  Filled 2022-06-29 (×2): qty 1

## 2022-06-29 MED ORDER — IPRATROPIUM-ALBUTEROL 0.5-2.5 (3) MG/3ML IN SOLN: 3.0000 mL | RESPIRATORY_TRACT | Status: DC

## 2022-06-29 MED ORDER — OXYCODONE HCL 5 MG PO TABS
5.0000 mg | ORAL_TABLET | ORAL | Status: DC | PRN
  Administered 2022-06-29 – 2022-06-30 (×2): 10 mg via ORAL
  Filled 2022-06-29 (×2): qty 2

## 2022-06-29 MED ORDER — FENTANYL CITRATE PF 50 MCG/ML IJ SOSY: 25.0000 ug | PREFILLED_SYRINGE | INTRAMUSCULAR | Status: DC | PRN

## 2022-06-29 MED ORDER — BUPIVACAINE IN DEXTROSE 0.75-8.25 % IT SOLN
INTRATHECAL | Status: DC | PRN
  Administered 2022-06-29: 1.6 mL via INTRATHECAL

## 2022-06-29 MED ORDER — CEFAZOLIN SODIUM-DEXTROSE 2-4 GM/100ML-% IV SOLN
2.0000 g | INTRAVENOUS | Status: AC
  Administered 2022-06-29: 2 g via INTRAVENOUS
  Filled 2022-06-29: qty 100

## 2022-06-29 MED ORDER — POVIDONE-IODINE 10 % EX SWAB
2.0000 | Freq: Once | CUTANEOUS | Status: AC
  Administered 2022-06-29: 2 via TOPICAL

## 2022-06-29 MED ORDER — ASPIRIN 81 MG PO CHEW
81.0000 mg | CHEWABLE_TABLET | Freq: Two times a day (BID) | ORAL | Status: DC
  Administered 2022-06-29 – 2022-06-30 (×2): 81 mg via ORAL
  Filled 2022-06-29 (×2): qty 1

## 2022-06-29 MED ORDER — MELOXICAM 15 MG PO TABS: 15.0000 mg | ORAL_TABLET | Freq: Every day | ORAL | 0 refills | Status: DC | PRN

## 2022-06-29 MED ORDER — OXYCODONE HCL 5 MG PO TABS: 10.0000 mg | ORAL_TABLET | ORAL | Status: DC | PRN

## 2022-06-29 MED ORDER — METHOCARBAMOL 500 MG IVPB - SIMPLE MED: 500.0000 mg | Freq: Four times a day (QID) | INTRAVENOUS | Status: DC | PRN

## 2022-06-29 SURGICAL SUPPLY — 55 items
BAG COUNTER SPONGE SURGICOUNT (BAG) IMPLANT
BAG SPNG CNTER NS LX DISP (BAG)
BLADE HEX COATED 2.75 (ELECTRODE) ×2 IMPLANT
BLADE SAG 18X100X1.27 (BLADE) ×2 IMPLANT
BLADE SAGITTAL 25.0X1.37X90 (BLADE) ×2 IMPLANT
BLADE SURG 15 STRL LF DISP TIS (BLADE) ×2 IMPLANT
BLADE SURG 15 STRL SS (BLADE) ×1
BLADE SURG SZ10 CARB STEEL (BLADE) IMPLANT
BNDG CMPR MED 10X6 ELC LF (GAUZE/BANDAGES/DRESSINGS) ×1
BNDG ELASTIC 6X10 VLCR STRL LF (GAUZE/BANDAGES/DRESSINGS) ×2 IMPLANT
BOWL SMART MIX CTS (DISPOSABLE) IMPLANT
BSPLAT TIB 4 KN TRITANIUM (Knees) ×1 IMPLANT
CLSR STERI-STRIP ANTIMIC 1/2X4 (GAUZE/BANDAGES/DRESSINGS) ×2 IMPLANT
COVER SURGICAL LIGHT HANDLE (MISCELLANEOUS) ×2 IMPLANT
CUFF TOURN SGL QUICK 34 (TOURNIQUET CUFF) ×1
CUFF TRNQT CYL 34X4.125X (TOURNIQUET CUFF) ×2 IMPLANT
DRAPE U-SHAPE 47X51 STRL (DRAPES) ×2 IMPLANT
DRSG MEPILEX POST OP 4X12 (GAUZE/BANDAGES/DRESSINGS) ×2 IMPLANT
DURAPREP 26ML APPLICATOR (WOUND CARE) ×4 IMPLANT
GLOVE BIO SURGEON STRL SZ7.5 (GLOVE) ×4 IMPLANT
GLOVE BIOGEL PI IND STRL 7.5 (GLOVE) ×2 IMPLANT
GLOVE BIOGEL PI IND STRL 8 (GLOVE) ×2 IMPLANT
GLOVE SURG SYN 7.5  E (GLOVE) ×1
GLOVE SURG SYN 7.5 E (GLOVE) ×1 IMPLANT
GLOVE SURG SYN 7.5 PF PI (GLOVE) ×2 IMPLANT
GOWN STRL REUS W/ TWL LRG LVL3 (GOWN DISPOSABLE) ×2 IMPLANT
GOWN STRL REUS W/ TWL XL LVL3 (GOWN DISPOSABLE) ×2 IMPLANT
GOWN STRL REUS W/TWL LRG LVL3 (GOWN DISPOSABLE) ×1
GOWN STRL REUS W/TWL XL LVL3 (GOWN DISPOSABLE) ×1
HANDPIECE INTERPULSE COAX TIP (DISPOSABLE) ×1
HOLDER FOLEY CATH W/STRAP (MISCELLANEOUS) IMPLANT
IMMOBILIZER KNEE 20 (SOFTGOODS) ×1
IMMOBILIZER KNEE 20 THIGH 36 (SOFTGOODS) IMPLANT
IMMOBILIZER KNEE 22 UNIV (SOFTGOODS) ×2 IMPLANT
INSERT TRIATH X3 SZ4 9 (Insert) IMPLANT
KNEE FEMORAL COMP RT RETAIN (Knees) IMPLANT
KNEE PATELLA ASYMMETRIC 9X29 (Knees) IMPLANT
KNEE TIBIAL COMP TRI SZ4 (Knees) IMPLANT
MANIFOLD NEPTUNE II (INSTRUMENTS) ×2 IMPLANT
NS IRRIG 1000ML POUR BTL (IV SOLUTION) ×2 IMPLANT
PACK TOTAL KNEE CUSTOM (KITS) ×2 IMPLANT
PIN FLUTED HEDLESS FIX 3.5X1/8 (PIN) IMPLANT
PROTECTOR NERVE ULNAR (MISCELLANEOUS) ×2 IMPLANT
SET HNDPC FAN SPRY TIP SCT (DISPOSABLE) ×2 IMPLANT
SPIKE FLUID TRANSFER (MISCELLANEOUS) ×2 IMPLANT
STRIP CLOSURE SKIN 1/2X4 (GAUZE/BANDAGES/DRESSINGS) IMPLANT
SUT MNCRL AB 3-0 PS2 18 (SUTURE) ×2 IMPLANT
SUT VIC AB 0 CT1 36 (SUTURE) ×2 IMPLANT
SUT VIC AB 1 CT1 36 (SUTURE) ×4 IMPLANT
SUT VIC AB 2-0 CT1 27 (SUTURE) ×1
SUT VIC AB 2-0 CT1 TAPERPNT 27 (SUTURE) ×2 IMPLANT
TRAY FOLEY MTR SLVR 14FR STAT (SET/KITS/TRAYS/PACK) IMPLANT
TRAY FOLEY MTR SLVR 16FR STAT (SET/KITS/TRAYS/PACK) IMPLANT
TUBE SUCTION HIGH CAP CLEAR NV (SUCTIONS) ×2 IMPLANT
WRAP KNEE MAXI GEL POST OP (GAUZE/BANDAGES/DRESSINGS) ×2 IMPLANT

## 2022-06-29 NOTE — Plan of Care (Signed)
  Problem: Nutrition: Goal: Adequate nutrition will be maintained Outcome: Progressing   Problem: Pain Managment: Goal: General experience of comfort will improve Outcome: Progressing   Problem: Nutrition: Goal: Adequate nutrition will be maintained Outcome: Progressing   Problem: Pain Managment: Goal: General experience of comfort will improve Outcome: Progressing

## 2022-06-29 NOTE — Anesthesia Preprocedure Evaluation (Addendum)
Anesthesia Evaluation  Patient identified by MRN, date of birth, ID band Patient awake    Reviewed: Allergy & Precautions, NPO status , Patient's Chart, lab work & pertinent test results  History of Anesthesia Complications (+) PONV and history of anesthetic complications  Airway Mallampati: III  TM Distance: >3 FB Neck ROM: Full    Dental  (+) Missing   Pulmonary COPD, Current Smoker and Patient abstained from smoking.   Pulmonary exam normal        Cardiovascular hypertension, Pt. on medications Normal cardiovascular exam     Neuro/Psych  Headaches PSYCHIATRIC DISORDERS Anxiety Depression     Neuromuscular disease    GI/Hepatic Neg liver ROS,GERD  Medicated and Controlled,,  Endo/Other  diabetes, Oral Hypoglycemic Agents    Renal/GU negative Renal ROS     Musculoskeletal  (+) Arthritis ,    Abdominal   Peds  Hematology negative hematology ROS (+)   Anesthesia Other Findings OA RIGHT KNEE  Reproductive/Obstetrics                             Anesthesia Physical Anesthesia Plan  ASA: 3  Anesthesia Plan: Spinal and Regional   Post-op Pain Management: Regional block*   Induction: Intravenous  PONV Risk Score and Plan: 2 and Ondansetron, Dexamethasone, Propofol infusion, Midazolam and Treatment may vary due to age or medical condition  Airway Management Planned: Simple Face Mask  Additional Equipment:   Intra-op Plan:   Post-operative Plan:   Informed Consent: I have reviewed the patients History and Physical, chart, labs and discussed the procedure including the risks, benefits and alternatives for the proposed anesthesia with the patient or authorized representative who has indicated his/her understanding and acceptance.     Dental advisory given  Plan Discussed with: CRNA  Anesthesia Plan Comments:        Anesthesia Quick Evaluation

## 2022-06-29 NOTE — Anesthesia Procedure Notes (Signed)
Spinal  Patient location during procedure: OR Start time: 06/29/2022 7:15 AM End time: 06/29/2022 7:21 AM Reason for block: surgical anesthesia Staffing Performed: anesthesiologist  Anesthesiologist: Murvin Natal, MD Performed by: Murvin Natal, MD Authorized by: Murvin Natal, MD   Preanesthetic Checklist Completed: patient identified, IV checked, risks and benefits discussed, surgical consent, monitors and equipment checked, pre-op evaluation and timeout performed Spinal Block Patient position: sitting Prep: DuraPrep Patient monitoring: cardiac monitor, continuous pulse ox and blood pressure Approach: midline Location: L4-5 Injection technique: single-shot Needle Needle type: Pencan  Needle gauge: 24 G Needle length: 9 cm Assessment Sensory level: T10 Events: CSF return Additional Notes Functioning IV was confirmed and monitors were applied. Sterile prep and drape, including hand hygiene and sterile gloves were used. The patient was positioned and the spine was prepped. The skin was anesthetized with lidocaine.  Free flow of clear CSF was obtained prior to injecting local anesthetic into the CSF.  The spinal needle aspirated freely following injection.  The needle was carefully withdrawn.  The patient tolerated the procedure well.

## 2022-06-29 NOTE — Discharge Instructions (Signed)

## 2022-06-29 NOTE — Anesthesia Procedure Notes (Signed)
Anesthesia Regional Block: Adductor canal block   Pre-Anesthetic Checklist: , timeout performed,  Correct Patient, Correct Site, Correct Laterality,  Correct Procedure,, site marked,  Risks and benefits discussed,  Surgical consent,  Pre-op evaluation,  At surgeon's request and post-op pain management  Laterality: Right  Prep: chloraprep       Needles:  Injection technique: Single-shot  Needle Type: Echogenic Stimulator Needle     Needle Length: 10cm  Needle Gauge: 20     Additional Needles:   Procedures:,,,, ultrasound used (permanent image in chart),,    Narrative:  Start time: 06/29/2022 6:50 AM End time: 06/29/2022 7:00 AM Injection made incrementally with aspirations every 5 mL.  Performed by: Personally  Anesthesiologist: Murvin Natal, MD  Additional Notes: Functioning IV was confirmed and monitors were applied. A time-out was performed. Hand hygiene and sterile gloves were used. The thigh was placed in a frog-leg position and prepped in a sterile fashion. A 156m 20ga Bbraun echogenic stimulator needle was placed using ultrasound guidance.  Negative aspiration and negative test dose prior to incremental administration of local anesthetic. The patient tolerated the procedure well.

## 2022-06-29 NOTE — Transfer of Care (Signed)
Immediate Anesthesia Transfer of Care Note  Patient: [name]  Procedure(s) Performed: TOTAL KNEE ARTHROPLASTY (Right: Knee)  Patient Location: PACU  Anesthesia Type:General  Level of Consciousness: awake and drowsy  Airway & Oxygen Therapy: Patient Spontanous Breathing and Patient connected to face mask oxygen  Post-op Assessment: Report given to RN and Post -op Vital signs reviewed and stable  Post vital signs: Reviewed and stable  Last Vitals:  Vitals Value Taken Time  BP 110/60 06/29/22 0915  Temp 37.1 C 06/29/22 0910  Pulse 82 06/29/22 0924  Resp 18 06/29/22 0924  SpO2 100 % 06/29/22 0924  Vitals shown include unvalidated device data.  Last Pain:  Vitals:   06/29/22 0613  TempSrc: Oral         Complications: No notable events documented.

## 2022-06-29 NOTE — Evaluation (Signed)
Physical Therapy Evaluation Patient Details Name: [name] MRN: [id number] DOB: [birth year] Today's Date: 06/29/2022  History of Present Illness  Pt is a 71yo female presenting s/p R-TKA on 06/29/22. PMH: hx of colon cancer, COPD, chronic bronchitis, DM, GERD, HLD, HTN, anterior lumbar fusion L5-S1, L-tKA 08/11/21   Clinical Impression  [name] is a 71 y.o. female POD 0 s/p R-TKA. Patient reports modified independence using SPC with mobility at baseline; does endorse recent fall ~3 weeks ago where she fell backwards while ascending step into the house from the garage, reports she did not hit her head so chose to not seek medical attention. Patient is now limited by functional impairments (see PT problem list below) and requires min guard for bed mobility and min assist for transfers, further mobility deferred as pt reporting RLE numbness and exhibiting R knee buckling despite use of R-KI. Additionally, pt on 2LO2 via [place] and SpO2 dropped to 85% despite supplementation and required pursed lip breathing and rest break to return to >90%. Patient instructed in exercise to facilitate ROM and circulation to manage edema. Patient will benefit from continued skilled PT interventions to address impairments and progress towards PLOF. Acute PT will follow to progress mobility and stair training in preparation for safe discharge home.       Recommendations for follow up therapy are one component of a multi-disciplinary discharge planning process, led by the attending physician.  Recommendations may be updated based on patient status, additional functional criteria and insurance authorization.  Follow Up Recommendations Follow physician's recommendations for discharge plan and follow up therapies      Assistance Recommended at Discharge Frequent or constant Supervision/Assistance  Patient can return home with the following  Assist for transportation;Help with stairs or ramp for entrance;Assistance  with cooking/housework;A little help with bathing/dressing/bathroom;A little help with walking and/or transfers    Equipment Recommendations None recommended by PT  Recommendations for Other Services       Functional Status Assessment Patient has had a recent decline in their functional status and demonstrates the ability to make significant improvements in function in a reasonable and predictable amount of time.     Precautions / Restrictions Precautions Precautions: Fall;Knee Precaution Booklet Issued: No Precaution Comments: no pillow under the knee. recent fall 3 weeks ago. Watch SpO2 Required Braces or Orthoses: Knee Immobilizer - Right Knee Immobilizer - Right: On when out of bed or walking;Discontinue once straight leg raise with < 10 degree lag Restrictions Weight Bearing Restrictions: No Other Position/Activity Restrictions: wbat      Mobility  Bed Mobility Overal bed mobility: Needs Assistance Bed Mobility: Supine to Sit     Supine to sit: Min guard     General bed mobility comments: For safety only. Pause for recovery of SpO2 as pt dipped to 89% despite remaining on 2L    Transfers Overall transfer level: Needs assistance Equipment used: Rolling walker (2 wheels) Transfers: Sit to/from Stand Sit to Stand: Min assist, From elevated surface           General transfer comment: Min assist for steadying, pt exhibiting R knee buckling despite use of KI, reporting numbness in R foot. SpO2 also dipped to 85% despite remaining on 2LO2 via . Further mobility deferred.    Ambulation/Gait               General Gait Details: unsafe at present  Science writer  Modified Rankin (Stroke Patients Only)       Balance Overall balance assessment: Needs assistance Sitting-balance support: Feet supported, No upper extremity supported Sitting balance-Leahy Scale: Good     Standing balance support: Reliant on assistive device  for balance, During functional activity, Bilateral upper extremity supported Standing balance-Leahy Scale: Poor                               Pertinent Vitals/Pain Pain Assessment Pain Assessment: 0-10 Pain Score: 8  Pain Location: right knee Pain Descriptors / Indicators: Operative site guarding, Headache Pain Intervention(s): Limited activity within patient's tolerance, Monitored during session, Repositioned, Ice applied    Home Living Family/patient expects to be discharged to:: Private residence Living Arrangements: Spouse/significant other Available Help at Discharge: Neighbor;Available PRN/intermittently;Family;Available 24 hours/day Type of Home: House Home Access: Stairs to enter Entrance Stairs-Rails: None Entrance Stairs-Number of Steps: 1 Alternate Level Stairs-Number of Steps: flight Home Layout: Two level;Able to live on main level with bedroom/bathroom Home Equipment: Shower seat;Cane - single point;Rolling Walker (2 wheels);Electric scooter      Prior Function Prior Level of Function : Independent/Modified Independent;Driving;Working/employed;History of Falls (last six months) (Dogsitting/critter care for work)             Mobility Comments: SPC for community distances. Fall: couple weeks ago, stepping into the house and lost balance and fell backwards. ADLs Comments: IND     Hand Dominance        Extremity/Trunk Assessment   Upper Extremity Assessment Upper Extremity Assessment: Overall WFL for tasks assessed    Lower Extremity Assessment Lower Extremity Assessment: RLE deficits/detail;[name] deficits/detail RLE Deficits / Details: MMT ank DF/PF 4/5, very small SLR 1" at most RLE Sensation: decreased light touch (from plantar surface of foot to hip) [name] Deficits / Details: MMT ank DF/PF 5/5 [name] Sensation: WNL    Cervical / Trunk Assessment Cervical / Trunk Assessment: Kyphotic  Communication   Communication: No difficulties   Cognition Arousal/Alertness: Awake/alert Behavior During Therapy: WFL for tasks assessed/performed Overall Cognitive Status: Within Functional Limits for tasks assessed                                          General Comments      Exercises Total Joint Exercises Ankle Circles/Pumps: AROM, Both, 20 reps   Assessment/Plan    PT Assessment Patient needs continued PT services  PT Problem List Decreased strength;Decreased range of motion;Decreased activity tolerance;Decreased balance;Decreased mobility;Decreased coordination;Pain       PT Treatment Interventions Gait training;DME instruction;Stair training;Functional mobility training;Therapeutic activities;Therapeutic exercise;Balance training;Neuromuscular re-education;Patient/family education    PT Goals (Current goals can be found in the Care Plan section)  Acute Rehab PT Goals Patient Stated Goal: Walk without pain PT Goal Formulation: With patient Time For Goal Achievement: 07/06/22 Potential to Achieve Goals: Good    Frequency 7X/week     Co-evaluation               AM-PAC PT "6 Clicks" Mobility  Outcome Measure Help needed turning from your back to your side while in a flat bed without using bedrails?: None Help needed moving from lying on your back to sitting on the side of a flat bed without using bedrails?: A Little Help needed moving to and from a bed to a chair (including a wheelchair)?: A Little Help  needed standing up from a chair using your arms (e.g., wheelchair or bedside chair)?: A Little Help needed to walk in hospital room?: A Little Help needed climbing 3-5 steps with a railing? : A Little 6 Click Score: 19    End of Session Equipment Utilized During Treatment: Gait belt;Right knee immobilizer;Oxygen (2LO2 via [place]) Activity Tolerance: Treatment limited secondary to medical complications (Comment) (SpO2 below 88% despite supplemental O2, R-knee buckling) Patient left: in  bed;with call bell/phone within reach (on stretcher in PACU) Nurse Communication: Mobility status PT Visit Diagnosis: Pain;Difficulty in walking, not elsewhere classified (R26.2) Pain - Right/Left: Right Pain - part of body: Knee    Time: 9470-7615 PT Time Calculation (min) (ACUTE ONLY): 36 min   Charges:   PT Evaluation $PT Eval Low Complexity: 1 Low PT Treatments $Therapeutic Activity: 8-22 mins        Coolidge Breeze, PT, DPT [facility] Department Office: [phone] Weekend pager: [phone]  Coolidge Breeze 06/29/2022, 4:21 PM

## 2022-06-29 NOTE — Progress Notes (Signed)
Orthopedic Tech Progress Note Patient Details:  [name] 1950/10/24 [id number]  Patient ID: [name], female   DOB: 1950-11-18, 71 y.o.   MRN: [id number]  [name] 06/29/2022, 10:15 AM Right bone foam applied in pacu

## 2022-06-29 NOTE — Interval H&P Note (Signed)
History and Physical Interval Note:  06/29/2022 6:55 AM  [name]  has presented today for surgery, with the diagnosis of OA RIGHT KNEE.  The various methods of treatment have been discussed with the patient and family. After consideration of risks, benefits and other options for treatment, the patient has consented to  Procedure(s): TOTAL KNEE ARTHROPLASTY (Right) as a surgical intervention.  The patient's history has been reviewed, patient examined, no change in status, stable for surgery.  I have reviewed the patient's chart and labs.  Questions were answered to the patient's satisfaction.     Renette Butters

## 2022-06-29 NOTE — Op Note (Signed)
DATE OF SURGERY:  06/29/2022 TIME: 8:31 AM  PATIENT NAME:  [name]   AGE: 71 y.o.    PRE-OPERATIVE DIAGNOSIS:  OA RIGHT KNEE  POST-OPERATIVE DIAGNOSIS:  Same  PROCEDURE:  Procedure(s): TOTAL KNEE ARTHROPLASTY   SURGEON:  Renette Butters, MD   ASSISTANT:  Aggie Moats, PA-C, he was present and scrubbed throughout the case, critical for completion in a timely fashion, and for retraction, instrumentation, and closure.    OPERATIVE IMPLANTS: [employer] Triathlon CR. Press fit knee  Femur size 4, Tibia size 4, Patella size 29 3-peg oval button, with a 9 mm polyethylene insert.   PREOPERATIVE INDICATIONS:  [name] is a 71 y.o. year old female with end stage bone on bone degenerative arthritis of the knee who failed conservative treatment, including injections, antiinflammatories, activity modification, and assistive devices, and had significant impairment of their activities of daily living, and elected for Total Knee Arthroplasty.   The risks, benefits, and alternatives were discussed at length including but not limited to the risks of infection, bleeding, nerve injury, stiffness, blood clots, the need for revision surgery, cardiopulmonary complications, among others, and they were willing to proceed.   OPERATIVE DESCRIPTION:  The patient was brought to the operative room and placed in a supine position.  General anesthesia was administered.  IV antibiotics were given.  The lower extremity was prepped and draped in the usual sterile fashion.  Time out was performed.  The leg was elevated and exsanguinated and the tourniquet was inflated.  Anterior approach was performed.  The patella was everted and osteophytes were removed.  The anterior horn of the medial and lateral meniscus was removed.   The distal femur was opened with the drill and the intramedullary distal femoral cutting jig was utilized, set at 5 degrees resecting 8 mm off the distal femur.  Care was taken to  protect the collateral ligaments.  The distal femoral sizing jig was applied, taking care to avoid notching.  Then the 4-in-1 cutting jig was applied and the anterior and posterior femur was cut, along with the chamfer cuts.  All posterior osteophytes were removed.  The flexion gap was then measured and was symmetric with the extension gap.  Then the extramedullary tibial cutting jig was utilized making the appropriate cut using the anterior tibial crest as a reference building in appropriate posterior slope.  Care was taken during the cut to protect the medial and collateral ligaments.  The proximal tibia was removed along with the posterior horns of the menisci.  The PCL was sacrificed.    The extensor gap was measured and was approximately 50m.    I completed the distal femoral preparation using the appropriate jig to prepare the box.  The patella was then measured, and cut with the saw.    The proximal tibia sized and prepared accordingly with the reamer and the punch, and then all components were trialed with the above sized poly insert.  The knee was found to have excellent balance and full motion.    The above named components were then impacted into place and Poly tibial piece and patella were inserted.  I was very happy with his stability and ROM  I performed a periarticular injection with marcaine and toradol  The knee was easily taken through a range of motion and the patella tracked well and the knee irrigated copiously and the parapatellar and subcutaneous tissue closed with vicryl, and monocryl with steri strips for the skin.  The  incision was dressed with sterile gauze and the tourniquet released and the patient was awakened and returned to the PACU in stable and satisfactory condition.  There were no complications.  Total tourniquet time was roughly 60 minutes.   POSTOPERATIVE PLAN: post op Abx, DVT px: SCD's, TED's, Early ambulation and chemical px

## 2022-06-30 ENCOUNTER — Encounter (HOSPITAL_COMMUNITY): Payer: Self-pay | Admitting: Orthopedic Surgery

## 2022-06-30 DIAGNOSIS — M1711 Unilateral primary osteoarthritis, right knee: Secondary | ICD-10-CM | POA: Diagnosis not present

## 2022-06-30 DIAGNOSIS — Z96652 Presence of left artificial knee joint: Secondary | ICD-10-CM | POA: Diagnosis not present

## 2022-06-30 DIAGNOSIS — Z79899 Other long term (current) drug therapy: Secondary | ICD-10-CM | POA: Diagnosis not present

## 2022-06-30 DIAGNOSIS — I10 Essential (primary) hypertension: Secondary | ICD-10-CM | POA: Diagnosis not present

## 2022-06-30 DIAGNOSIS — E119 Type 2 diabetes mellitus without complications: Secondary | ICD-10-CM | POA: Diagnosis not present

## 2022-06-30 DIAGNOSIS — Z7984 Long term (current) use of oral hypoglycemic drugs: Secondary | ICD-10-CM | POA: Diagnosis not present

## 2022-06-30 DIAGNOSIS — J449 Chronic obstructive pulmonary disease, unspecified: Secondary | ICD-10-CM | POA: Diagnosis not present

## 2022-06-30 DIAGNOSIS — Z96651 Presence of right artificial knee joint: Secondary | ICD-10-CM | POA: Diagnosis not present

## 2022-06-30 DIAGNOSIS — Z85038 Personal history of other malignant neoplasm of large intestine: Secondary | ICD-10-CM | POA: Diagnosis not present

## 2022-06-30 DIAGNOSIS — F17210 Nicotine dependence, cigarettes, uncomplicated: Secondary | ICD-10-CM | POA: Diagnosis not present

## 2022-06-30 NOTE — Discharge Summary (Signed)
Physician Discharge Summary  Patient ID: [name] MRN: [id number] DOB/AGE: July 02, 1951 71 y.o.  Admit date: 06/29/2022 Discharge date: 06/30/2022  Admission Diagnoses: right knee OA  Discharge Diagnoses:  Principal Problem:   S/P total knee arthroplasty, right   Discharged Condition: fair  Hospital Course: Patient underwent a right TKA with Dr. Percell Miller on 48/01/65 without complications. She had to spend the night in observation due to pain control, mobility issues, and low O2 saturation levels even on RA. She has passed her PT evaluation as far as mobilization goes but continues to struggle to keep her O2 levels >90%. I have decided to send her home with an order for home O2 that I want her to use constantly until her follow up visit.   Consults: None  Significant Diagnostic Studies: n/a  Treatments: IV hydration, antibiotics: vancomycin, analgesia: acetaminophen, Dilaudid, and Oxycodone, anticoagulation: ASA, respiratory therapy: O2, therapies: PT, OT, and SW, and surgery: right TKA  Discharge Exam: Blood pressure (!) 149/85, pulse 90, temperature 98.8 F (37.1 C), temperature source Oral, resp. rate 18, height '5\' 5"'$  (1.651 m), weight 81.2 kg, SpO2 93 %. General appearance: alert, cooperative, and no distress Head: Normocephalic, without obvious abnormality, atraumatic Resp: wheezes bilaterally Cardio: regular rate and rhythm, S1, S2 normal, no murmur, click, rub or gallop Extremities: extremities normal, atraumatic, no cyanosis or edema Pulses:  L brachial 2+ R brachial 2+  L radial 2+ R radial 2+  L inguinal 2+ R inguinal 2+  L popliteal 2+ R popliteal 2+  L posterior tibial 2+ R posterior tibial 2+  L dorsalis pedis 2+ R dorsalis pedis 2+   Neurologic: Grossly normal Incision/Wound: c/d/i  Disposition: Discharge disposition: 01-Home or Self Care       Discharge Instructions     CPM   Complete by: As directed    Continuous passive motion machine (CPM):       Use the CPM from 0 to 90 degrees for 6 hours per day.      You may break it up into 2 or 3 sessions per day.      Use CPM for 3 weeks or until you are told to stop.   Call MD / Call 911   Complete by: As directed    If you experience chest pain or shortness of breath, CALL 911 and be transported to the hospital emergency room.  If you develope a fever above 101 F, pus (white drainage) or increased drainage or redness at the wound, or calf pain, call your surgeon's office.   Call MD / Call 911   Complete by: As directed    If you experience chest pain or shortness of breath, CALL 911 and be transported to the hospital emergency room.  If you develope a fever above 101 F, pus (white drainage) or increased drainage or redness at the wound, or calf pain, call your surgeon's office.   Diet - low sodium heart healthy   Complete by: As directed    Diet - low sodium heart healthy   Complete by: As directed    Discharge instructions   Complete by: As directed    You may bear weight as tolerated. Keep your dressing on and dry until follow up. Take medicine to prevent blood clots as directed. Take pain medicine as needed with the goal of transitioning to over the counter medicines.    INSTRUCTIONS AFTER JOINT REPLACEMENT   Remove items at home which could result in a fall. This includes  throw rugs or furniture in walking pathways ICE to the affected joint every three hours while awake for 30 minutes at a time, for at least the first 3-5 days, and then as needed for pain and swelling.  Continue to use ice for pain and swelling. You may notice swelling that will progress down to the foot and ankle.  This is normal after surgery.  Elevate your leg when you are not up walking on it.   Continue to use the breathing machine you got in the hospital (incentive spirometer) which will help keep your temperature down.  It is common for your temperature to cycle up and down following surgery, especially at night  when you are not up moving around and exerting yourself.  The breathing machine keeps your lungs expanded and your temperature down.   DIET:  As you were doing prior to hospitalization, we recommend a well-balanced diet.  DRESSING / WOUND CARE / SHOWERING  You may shower 3 days after surgery, but keep the wounds dry during showering.  You may use an occlusive plastic wrap (Press'n Seal for example) with blue painter's tape at edges, NO SOAKING/SUBMERGING IN THE BATHTUB.  If the bandage gets wet, call the office.   ACTIVITY  Increase activity slowly as tolerated, but follow the weight bearing instructions below.   No driving for 6 weeks or until further direction given by your physician.  You cannot drive while taking narcotics.  No lifting or carrying greater than 10 lbs. until further directed by your surgeon. Avoid periods of inactivity such as sitting longer than an hour when not asleep. This helps prevent blood clots.  You may return to work once you are authorized by your doctor.    WEIGHT BEARING   Weight bearing as tolerated with assist device (walker, cane, etc) as directed, use it as long as suggested by your surgeon or therapist, typically at least 4-6 weeks.   EXERCISES  Results after joint replacement surgery are often greatly improved when you follow the exercise, range of motion and muscle strengthening exercises prescribed by your doctor. Safety measures are also important to protect the joint from further injury. Any time any of these exercises cause you to have increased pain or swelling, decrease what you are doing until you are comfortable again and then slowly increase them. If you have problems or questions, call your caregiver or physical therapist for advice.   Rehabilitation is important following a joint replacement. After just a few days of immobilization, the muscles of the leg can become weakened and shrink (atrophy).  These exercises are designed to build up  the tone and strength of the thigh and leg muscles and to improve motion. Often times heat used for twenty to thirty minutes before working out will loosen up your tissues and help with improving the range of motion but do not use heat for the first two weeks following surgery (sometimes heat can increase post-operative swelling).   These exercises can be done on a training (exercise) mat, on the floor, on a table or on a bed. Use whatever works the best and is most comfortable for you.    Use music or television while you are exercising so that the exercises are a pleasant break in your day. This will make your life better with the exercises acting as a break in your routine that you can look forward to.   Perform all exercises about fifteen times, three times per day or as directed.  You should exercise both the operative leg and the other leg as well.  Exercises include:   Quad Sets - Tighten up the muscle on the front of the thigh (Quad) and hold for 5-10 seconds.   Straight Leg Raises - With your knee straight (if you were given a brace, keep it on), lift the leg to 60 degrees, hold for 3 seconds, and slowly lower the leg.  Perform this exercise against resistance later as your leg gets stronger.  Leg Slides: Lying on your back, slowly slide your foot toward your buttocks, bending your knee up off the floor (only go as far as is comfortable). Then slowly slide your foot back down until your leg is flat on the floor again.  Angel Wings: Lying on your back spread your legs to the side as far apart as you can without causing discomfort.  Hamstring Strength:  Lying on your back, push your heel against the floor with your leg straight by tightening up the muscles of your buttocks.  Repeat, but this time bend your knee to a comfortable angle, and push your heel against the floor.  You may put a pillow under the heel to make it more comfortable if necessary.   A rehabilitation program following joint  replacement surgery can speed recovery and prevent re-injury in the future due to weakened muscles. Contact your doctor or a physical therapist for more information on knee rehabilitation.    CONSTIPATION  Constipation is defined medically as fewer than three stools per week and severe constipation as less than one stool per week.  Even if you have a regular bowel pattern at home, your normal regimen is likely to be disrupted due to multiple reasons following surgery.  Combination of anesthesia, postoperative narcotics, change in appetite and fluid intake all can affect your bowels.   YOU MUST use at least one of the following options; they are listed in order of increasing strength to get the job done.  They are all available over the counter, and you may need to use some, POSSIBLY even all of these options:    Drink plenty of fluids (prune juice may be helpful) and high fiber foods Colace 100 mg by mouth twice a day  Senokot for constipation as directed and as needed Dulcolax (bisacodyl), take with full glass of water  Miralax (polyethylene glycol) once or twice a day as needed.  If you have tried all these things and are unable to have a bowel movement in the first 3-4 days after surgery call either your surgeon or your primary doctor.    If you experience loose stools or diarrhea, hold the medications until you stool forms back up.  If your symptoms do not get better within 1 week or if they get worse, check with your doctor.  If you experience "the worst abdominal pain ever" or develop nausea or vomiting, please contact the office immediately for further recommendations for treatment.   ITCHING:  If you experience itching with your medications, try taking only a single pain pill, or even half a pain pill at a time.  You can also use Benadryl over the counter for itching or also to help with sleep.   TED HOSE STOCKINGS:  Use stockings on both legs until for at least 2 weeks or as directed by  physician office. They may be removed at night for sleeping.  MEDICATIONS:  See your medication summary on the "After Visit Summary" that nursing will review with you.  You may have some home medications which will be placed on hold until you complete the course of blood thinner medication.  It is important for you to complete the blood thinner medication as prescribed.  Take medicines as prescribed.   You have several different medicines that work in different ways. - Tylenol is for mild to moderate pain. Try to take this medicine before turning to your narcotic medicines.  - Meloxicam is to reduce pain / inflammation - Robaxin is for muscle spasms. This medicine can make you drowsy. - Oxycodone is a narcotic pain medicine.  Take this for severe pain. This medicine can be dehydrating / constipating. - Zofran is for nausea and vomiting. - Senokot is for constipation prevention while taking narcotics.   - Aspirin is to prevent blood clots after surgery. YOU MUST TAKE THIS MEDICINE!  PRECAUTIONS:  If you experience chest pain or shortness of breath - call 911 immediately for transfer to the hospital emergency department.   If you develop a fever greater that 101 F, purulent drainage from wound, increased redness or drainage from wound, foul odor from the wound/dressing, or calf pain - CONTACT YOUR SURGEON.                                                   FOLLOW-UP APPOINTMENTS:  07/14/22 at 4:15pm   OTHER INSTRUCTIONS:   MAKE SURE YOU:  Understand these instructions.  Get help right away if you are not doing well or get worse.    Thank you for letting us be a part of your medical care team.  It is a privilege we respect greatly.  We hope these instructions will help you stay on track for a fast and full recovery!   Do not put a pillow under the knee. Place it under the heel.   Complete by: As directed    Driving restrictions   Complete by: As directed    No driving for 2-6 weeks    Post-operative opioid taper instructions:   Complete by: As directed    POST-OPERATIVE OPIOID TAPER INSTRUCTIONS: It is important to wean off of your opioid medication as soon as possible. If you do not need pain medication after your surgery it is ok to stop day one. Opioids include: Codeine, Hydrocodone(Norco, Vicodin), Oxycodone(Percocet, oxycontin) and hydromorphone amongst others.  Long term and even short term use of opiods can cause: Increased pain response Dependence Constipation Depression Respiratory depression And more.  Withdrawal symptoms can include Flu like symptoms Nausea, vomiting And more Techniques to manage these symptoms Hydrate well Eat regular healthy meals Stay active Use relaxation techniques(deep breathing, meditating, yoga) Do Not substitute Alcohol to help with tapering If you have been on opioids for less than two weeks and do not have pain than it is ok to stop all together.  Plan to wean off of opioids This plan should start within one week post op of your joint replacement. Maintain the same interval or time between taking each dose and first decrease the dose.  Cut the total daily intake of opioids by one tablet each day Next start to increase the time between doses. The last dose that should be eliminated is the evening dose.      TED hose   Complete by: As directed    Use stockings (TED hose) for 2  weeks on right leg(s).  You may remove them at night for sleeping.   Weight bearing as tolerated   Complete by: As directed    Laterality: right   Extremity: Lower   Weight bearing as tolerated   Complete by: As directed       Allergies as of 06/30/2022       Reactions   Benadryl [diphenhydramine Hcl] Nausea And Vomiting, Other (See Comments)   Migraine headaches.   Lansoprazole Nausea And Vomiting, Other (See Comments)   Migraine headache   Morphine Sulfate Nausea And Vomiting, Other (See Comments)   Migraine headaches.   Penicillins  Nausea And Vomiting, Other (See Comments)   INTOLERANCE >  NAUSEA & VOMITING YEAST INFECTION > CHANGE IN NORMAL FLORA Has patient had a PCN reaction causing immediate rash, facial/tongue/throat swelling, SOB or lightheadedness with hypotension:No Has patient had a PCN reaction causing severe rash involving mucus membranes or skin necrosis: No Has patient had a PCN reaction that required hospitalization No Has patient had a PCN reaction occurring within the last 10 years:No   Red Dye Other (See Comments)   Also allergic to [facility] Dye-migraine headaches.   Simvastatin Nausea And Vomiting, Other (See Comments)   Migraine   Codeine Nausea And Vomiting   Shellfish Allergy Other (See Comments)   Congestion   Spiriva Respimat [tiotropium Bromide Monohydrate] Cough   Sulfa Antibiotics Nausea And Vomiting        Medication List     TAKE these medications    acetaminophen 500 MG tablet Commonly known as: TYLENOL Take 2 tablets (1,000 mg total) by mouth every 6 (six) hours as needed for mild pain or moderate pain.   amitriptyline 25 MG tablet Commonly known as: ELAVIL Take 100 mg by mouth at bedtime.   ARIPiprazole 15 MG tablet Commonly known as: ABILIFY Take 15 mg by mouth at bedtime.   aspirin EC 81 MG tablet Take 1 tablet (81 mg total) by mouth 2 (two) times daily. To prevent blood clots for 30 days after surgery.   calcium carbonate 500 MG chewable tablet Commonly known as: TUMS - dosed in mg elemental calcium Chew 2 tablets by mouth 4 (four) times daily as needed for indigestion or heartburn.   famotidine 20 MG tablet Commonly known as: PEPCID Take 20 mg by mouth in the morning.   gabapentin 100 MG capsule Commonly known as: Neurontin Take 1 capsule (100 mg total) by mouth 3 (three) times daily. What changed: when to take this   losartan 25 MG tablet Commonly known as: COZAAR Take 1 tablet (25 mg total) by mouth daily. What changed: when to take this   meloxicam 15  MG tablet Commonly known as: MOBIC Take 1 tablet (15 mg total) by mouth daily as needed for pain (and inflammation). What changed:  when to take this reasons to take this   metFORMIN 500 MG 24 hr tablet Commonly known as: GLUCOPHAGE-XR Take 1,000 mg by mouth every evening.   methocarbamol 750 MG tablet Commonly known as: Robaxin-750 Take 1 tablet (750 mg total) by mouth every 8 (eight) hours as needed for muscle spasms.   omeprazole 20 MG tablet Commonly known as: PriLOSEC OTC Take 1 tablet (20 mg total) by mouth daily. For gastric protection   ondansetron 4 MG disintegrating tablet Commonly known as: ZOFRAN-ODT Take 1 tablet (4 mg total) by mouth 2 (two) times daily as needed for nausea or vomiting.   oxyCODONE 5 MG immediate release tablet Commonly known  as: Roxicodone Take 1-2 tablets (5-10 mg total) by mouth every 6 (six) hours as needed for severe pain.   PARoxetine 30 MG tablet Commonly known as: PAXIL Take 30 mg by mouth every morning.   rosuvastatin 20 MG tablet Commonly known as: CRESTOR Take 1 tablet (20 mg total) by mouth at bedtime.   sennosides-docusate sodium 8.6-50 MG tablet Commonly known as: SENOKOT-S Take 2 tablets by mouth daily as needed for constipation (while taking narcotics).   Vitamin B-12 5000 MCG Tbdp Take 5,000 mcg by mouth at bedtime.   Vitamin D 50 MCG (2000 UT) Caps Take 2,000 Units by mouth at bedtime.               Durable Medical Equipment  (From admission, onward)           Start     Ordered   06/30/22 1518  For home use only DME oxygen  Once       Question Answer Comment  Length of Need 6 Months   Mode or (Route) Nasal cannula   Liters per Minute 3   Frequency Continuous (stationary and portable oxygen unit needed)   Oxygen delivery system Gas      06/30/22 1518              Discharge Care Instructions  (From admission, onward)           Start     Ordered   06/30/22 0000  Weight bearing as  tolerated        06/30/22 1532   06/29/22 0000  Weight bearing as tolerated       Question Answer Comment  Laterality right   Extremity Lower      06/29/22 0927            Follow-up Information     Renette Butters, MD. Go on 07/14/2022.   Specialty: Orthopedic Surgery Why: at 4:15pm Contact information: [address] [place] [address] [phone]                 Signed: Alisa Graff 06/30/2022, 3:33 PM

## 2022-06-30 NOTE — TOC Transition Note (Addendum)
Transition of Care (TOC) - CM/SW Discharge Note   Patient Details  Name: [name] MRN: [id number] Date of Birth: 06/28/1951  Transition of Care (TOC) CM/SW Contact:  , , LCSW Phone Number: 06/30/2022, 3:30 PM   Clinical Narrative:     Met with pt who confirms she has needed DME at home.  OPPT already arranged with [facility].  No further TOC needs.  ADDENDUM: Alerted that pt will need oxygen for home use.  Documentation in and pt aware.  No dme agency preference.  Order placed with [employer] [facility] for delivery of portable to room and concentrator to home.    Final next level of care: OP Rehab Barriers to Discharge: No Barriers Identified   Patient Goals and CMS Choice Patient states their goals for this hospitalization and ongoing recovery are:: return home      Discharge Placement                       Discharge Plan and Services                DME Arranged: Oxygen DME Agency: [facility] Date DME Agency Contacted: 06/30/22 Time DME Agency Contacted: 1529 Representative spoke with at DME Agency: Kristin            Social Determinants of Health (SDOH) Interventions     Readmission Risk Interventions     No data to display             

## 2022-06-30 NOTE — Anesthesia Postprocedure Evaluation (Signed)
Anesthesia Post Note  Patient: [name]  Procedure(s) Performed: TOTAL KNEE ARTHROPLASTY (Right: Knee)     Patient location during evaluation: PACU Anesthesia Type: Regional and Spinal Level of consciousness: awake Pain management: pain level controlled Vital Signs Assessment: post-procedure vital signs reviewed and stable Respiratory status: spontaneous breathing, nonlabored ventilation and patient connected to nasal cannula oxygen Cardiovascular status: stable and blood pressure returned to baseline Postop Assessment: no apparent nausea or vomiting Anesthetic complications: no Comments: Patient unable to be same day discharge due to supplemental oxygen requirements in the PACU.  No notable events documented.  Last Vitals:  Vitals:   06/30/22 0134 06/30/22 0528  BP: (!) 141/84 123/71  Pulse: 86 91  Resp: 17 17  Temp: (!) 36.3 C 36.6 C  SpO2: 98% 97%    Last Pain:  Vitals:   06/30/22 0528  TempSrc: Oral  PainSc:                  Karyl Kinnier Meggan Dhaliwal

## 2022-06-30 NOTE — Plan of Care (Signed)
  Problem: Education: Goal: Knowledge of the prescribed therapeutic regimen will improve Outcome: Progressing   Problem: Clinical Measurements: Goal: Ability to maintain clinical measurements within normal limits will improve Outcome: Progressing Goal: Respiratory complications will improve Outcome: Progressing

## 2022-06-30 NOTE — Progress Notes (Signed)
Physical Therapy Treatment Patient Details Name: [name] MRN: [id number] DOB: 04-07-1951 Today's Date: 06/30/2022   SATURATION QUALIFICATIONS: (This note is used to comply with regulatory documentation for home oxygen)  Patient Saturations on Room Air at Rest = 91%  Patient Saturations on Room Air while Ambulating = 80%  Patient Saturations on 2 Liters of oxygen while Ambulating = 87%    History of Present Illness Pt is a 71yo female presenting s/p R-TKA on 06/29/22. PMH: hx of colon cancer, COPD, chronic bronchitis, DM, GERD, HLD, HTN, anterior lumbar fusion L5-S1, L-tKA 08/11/21    PT Comments    Reviewed/practiced gait training and 1 stair step training. Moderate pain with activity. Pt expresses concerns about being able to manage at home with O2 tank. Encouraged pt to ambulate often, as tolerated. All PT education completed. Will defer d/c decision to MD/PA and nursing.    Recommendations for follow up therapy are one component of a multi-disciplinary discharge planning process, led by the attending physician.  Recommendations may be updated based on patient status, additional functional criteria and insurance authorization.  Follow Up Recommendations  Follow physician's recommendations for discharge plan and follow up therapies     Assistance Recommended at Discharge Intermittent Supervision/Assistance  Patient can return home with the following Assistance with cooking/housework;Assist for transportation;Help with stairs or ramp for entrance   Equipment Recommendations  None recommended by PT    Recommendations for Other Services       Precautions / Restrictions Precautions Precautions: Fall;Knee Required Braces or Orthoses: Knee Immobilizer - Right Knee Immobilizer - Right: Discontinue once straight leg raise with < 10 degree lag Restrictions Weight Bearing Restrictions: No Other Position/Activity Restrictions: wbat     Mobility  Bed Mobility                General bed mobility comments: oob in recliner    Transfers Overall transfer level: Needs assistance Equipment used: Rolling walker (2 wheels) Transfers: Sit to/from Stand Sit to Stand: Supervision           General transfer comment: Supv for safety, hand/LE placement.    Ambulation/Gait Ambulation/Gait assistance: Min guard Gait Distance (Feet): 50 Feet Assistive device: Rolling walker (2 wheels) Gait Pattern/deviations: Step-through pattern, Decreased stride length       General Gait Details: Min guard for safety. O2 87% on 2L, dyspnea 2/4.   Stairs  Min Assist  Steps: 1  Forwards; With RW Cues for safety, technique, sequence. Assist to manage RW.            Wheelchair Mobility    Modified Rankin (Stroke Patients Only)       Balance Overall balance assessment: Needs assistance         Standing balance support: Reliant on assistive device for balance, During functional activity, Bilateral upper extremity supported Standing balance-Leahy Scale: Fair                              Cognition Arousal/Alertness: Awake/alert Behavior During Therapy: WFL for tasks assessed/performed Overall Cognitive Status: Within Functional Limits for tasks assessed                                          Exercises     General Comments        Pertinent Vitals/Pain Pain Assessment Pain Assessment: 0-10  Pain Score: 7  Pain Location: right knee Pain Descriptors / Indicators: Discomfort, Sore, Aching Pain Intervention(s): Monitored during session    Home Living                          Prior Function            PT Goals (current goals can now be found in the care plan section) Progress towards PT goals: Progressing toward goals    Frequency    7X/week      PT Plan Current plan remains appropriate    Co-evaluation              AM-PAC PT "6 Clicks" Mobility   Outcome Measure  Help needed  turning from your back to your side while in a flat bed without using bedrails?: None Help needed moving from lying on your back to sitting on the side of a flat bed without using bedrails?: None Help needed moving to and from a bed to a chair (including a wheelchair)?: A Little Help needed standing up from a chair using your arms (e.g., wheelchair or bedside chair)?: A Little Help needed to walk in hospital room?: A Little Help needed climbing 3-5 steps with a railing? : A Little 6 Click Score: 20    End of Session Equipment Utilized During Treatment: Oxygen;Gait belt Activity Tolerance: Patient tolerated treatment well Patient left: in chair;with call bell/phone within reach   PT Visit Diagnosis: Pain;Difficulty in walking, not elsewhere classified (R26.2) Pain - Right/Left: Right Pain - part of body: Knee     Time: 6948-5462 PT Time Calculation (min) (ACUTE ONLY): 19 min  Charges:  $Gait Training: 8-22 mins $Therapeutic Exercise: 8-22 mins                         Doreatha Massed, PT Acute Rehabilitation  Office: [phone]

## 2022-06-30 NOTE — Progress Notes (Signed)
Physical Therapy Treatment Patient Details Name: [name] MRN: [id number] DOB: [birth year] Today's Date: 06/30/2022   History of Present Illness Pt is a 71yo female presenting s/p R-TKA on 06/29/22. PMH: hx of colon cancer, COPD, chronic bronchitis, DM, GERD, HLD, HTN, anterior lumbar fusion L5-S1, L-tKA 08/11/21    PT Comments    Pt is mobilizing well but is limited by drop in O2 sats with activity. O2: 91% on RA at rest, 80% on RA with ambulation. Moderate pain with activity. Will continue to progress activity as safely able.    Recommendations for follow up therapy are one component of a multi-disciplinary discharge planning process, led by the attending physician.  Recommendations may be updated based on patient status, additional functional criteria and insurance authorization.  Follow Up Recommendations  Follow physician's recommendations for discharge plan and follow up therapies     Assistance Recommended at Discharge Intermittent Supervision/Assistance  Patient can return home with the following Assistance with cooking/housework;Assist for transportation;Help with stairs or ramp for entrance   Equipment Recommendations  None recommended by PT    Recommendations for Other Services       Precautions / Restrictions Precautions Precautions: Fall;Knee Required Braces or Orthoses: Knee Immobilizer - Right Knee Immobilizer - Right: Discontinue once straight leg raise with < 10 degree lag Restrictions Weight Bearing Restrictions: No Other Position/Activity Restrictions: wbat     Mobility  Bed Mobility               General bed mobility comments: oob in recliner    Transfers Overall transfer level: Needs assistance Equipment used: Rolling walker (2 wheels) Transfers: Sit to/from Stand Sit to Stand: Supervision           General transfer comment: Supv for safety, hand/LE placement.    Ambulation/Gait Ambulation/Gait assistance: Min guard Gait  Distance (Feet): 50 Feet Assistive device: Rolling walker (2 wheels) Gait Pattern/deviations: Step-through pattern, Decreased stride length       General Gait Details: Min guard for safety. O2 80% on RA, dyspnea 2/4.   Stairs             Wheelchair Mobility    Modified Rankin (Stroke Patients Only)       Balance Overall balance assessment: Needs assistance         Standing balance support: Reliant on assistive device for balance, During functional activity, Bilateral upper extremity supported Standing balance-Leahy Scale: Fair                              Cognition Arousal/Alertness: Awake/alert Behavior During Therapy: WFL for tasks assessed/performed Overall Cognitive Status: Within Functional Limits for tasks assessed                                          Exercises Total Joint Exercises Ankle Circles/Pumps: AROM, Both, 10 reps Quad Sets: AROM, Both, 10 reps Hip ABduction/ADduction: AROM, Right, 10 reps Straight Leg Raises: AAROM, AROM, Right, 10 reps Knee Flexion: AAROM, Right, 10 reps, Seated Goniometric ROM: ~10-65 degrees    General Comments        Pertinent Vitals/Pain Pain Assessment Pain Assessment: 0-10 Pain Score: 7  Pain Location: right knee Pain Descriptors / Indicators: Discomfort, Sore, Aching Pain Intervention(s): Limited activity within patient's tolerance, Monitored during session, Ice applied    Home Living  Prior Function            PT Goals (current goals can now be found in the care plan section) Progress towards PT goals: Progressing toward goals    Frequency    7X/week      PT Plan Current plan remains appropriate    Co-evaluation              AM-PAC PT "6 Clicks" Mobility   Outcome Measure  Help needed turning from your back to your side while in a flat bed without using bedrails?: None Help needed moving from lying on your back to  sitting on the side of a flat bed without using bedrails?: None Help needed moving to and from a bed to a chair (including a wheelchair)?: A Little Help needed standing up from a chair using your arms (e.g., wheelchair or bedside chair)?: A Little Help needed to walk in hospital room?: A Little Help needed climbing 3-5 steps with a railing? : A Little 6 Click Score: 20    End of Session Equipment Utilized During Treatment: Oxygen;Gait belt Activity Tolerance: Patient tolerated treatment well (limited by drop in O2 sats) Patient left: in chair;with call bell/phone within reach   PT Visit Diagnosis: Pain;Difficulty in walking, not elsewhere classified (R26.2) Pain - Right/Left: Right Pain - part of body: Knee     Time: 9450-3888 PT Time Calculation (min) (ACUTE ONLY): 27 min  Charges:  $Gait Training: 8-22 mins $Therapeutic Exercise: 8-22 mins                         Doreatha Massed, PT Acute Rehabilitation  Office: [phone]

## 2022-06-30 NOTE — Plan of Care (Signed)
  Problem: Education: Goal: Knowledge of the prescribed therapeutic regimen will improve 06/30/2022 1538 by Iver Nestle A, LPN Outcome: Completed/Met 06/30/2022 1053 by Lise Auer, LPN Outcome: Progressing Goal: Individualized Educational Video(s) Outcome: Completed/Met   Problem: Activity: Goal: Ability to avoid complications of mobility impairment will improve Outcome: Completed/Met Goal: Range of joint motion will improve Outcome: Completed/Met   Problem: Clinical Measurements: Goal: Postoperative complications will be avoided or minimized Outcome: Completed/Met   Problem: Pain Management: Goal: Pain level will decrease with appropriate interventions Outcome: Completed/Met   Problem: Skin Integrity: Goal: Will show signs of wound healing Outcome: Completed/Met   Problem: Education: Goal: Knowledge of General Education information will improve Description: Including pain rating scale, medication(s)/side effects and non-pharmacologic comfort measures Outcome: Completed/Met   Problem: Health Behavior/Discharge Planning: Goal: Ability to manage health-related needs will improve Outcome: Completed/Met   Problem: Clinical Measurements: Goal: Ability to maintain clinical measurements within normal limits will improve 06/30/2022 1538 by Iver Nestle A, LPN Outcome: Completed/Met 06/30/2022 1053 by Iver Nestle A, LPN Outcome: Progressing Goal: Will remain free from infection Outcome: Completed/Met Goal: Diagnostic test results will improve Outcome: Completed/Met Goal: Respiratory complications will improve 06/30/2022 1538 by Iver Nestle A, LPN Outcome: Completed/Met 06/30/2022 1053 by Iver Nestle A, LPN Outcome: Progressing Goal: Cardiovascular complication will be avoided Outcome: Completed/Met   Problem: Activity: Goal: Risk for activity intolerance will decrease Outcome: Completed/Met   Problem: Nutrition: Goal: Adequate nutrition will be  maintained Outcome: Completed/Met   Problem: Coping: Goal: Level of anxiety will decrease Outcome: Completed/Met   Problem: Elimination: Goal: Will not experience complications related to bowel motility Outcome: Completed/Met Goal: Will not experience complications related to urinary retention Outcome: Completed/Met   Problem: Pain Managment: Goal: General experience of comfort will improve Outcome: Completed/Met   Problem: Safety: Goal: Ability to remain free from injury will improve Outcome: Completed/Met   Problem: Skin Integrity: Goal: Risk for impaired skin integrity will decrease Outcome: Completed/Met

## 2022-06-30 NOTE — Progress Notes (Addendum)
Subjective: Patient reports pain as mild to moderate.  Tolerating diet.  Urinating.   No CP, SOB.  Has mobilized some yesterday OOB with PT.   Objective:   VITALS:   Vitals:   06/29/22 1735 06/29/22 2056 06/30/22 0134 06/30/22 0528  BP: 132/77 (!) 144/88 (!) 141/84 123/71  Pulse: 81 84 86 91  Resp: '17 17 17 17  '$ Temp: 97.8 F (36.6 C) (!) 97.5 F (36.4 C) (!) 97.4 F (36.3 C) 97.8 F (36.6 C)  TempSrc:  Oral Oral Oral  SpO2: 94% 95% 98% 97%  Weight:      Height:          Latest Ref Rng & Units 06/16/2022    1:30 PM 07/31/2021   10:58 AM 04/14/2020    5:16 PM  CBC  WBC 4.0 - 10.5 K/uL 9.6  11.5  11.9   Hemoglobin 12.0 - 15.0 g/dL 13.9  13.5  14.9   Hematocrit 36.0 - 46.0 % 42.3  41.9  43.7   Platelets 150 - 400 K/uL 207  252  274       Latest Ref Rng & Units 06/16/2022    1:30 PM 07/31/2021   10:58 AM 04/14/2020    5:16 PM  BMP  Glucose 70 - 99 mg/dL 215  104  155   BUN 8 - 23 mg/dL '11  13  16   '$ Creatinine 0.44 - 1.00 mg/dL 0.61  0.76  0.80   Sodium 135 - 145 mmol/L 139  137  138   Potassium 3.5 - 5.1 mmol/L 4.0  4.1  3.9   Chloride 98 - 111 mmol/L 101  102  102   CO2 22 - 32 mmol/L '29  28  26   '$ Calcium 8.9 - 10.3 mg/dL 9.3  8.9  9.3    Intake/Output      11/28 0701 11/29 0700 11/29 0701 11/30 0700   P.O.  240   I.V. (mL/kg) 1200 (14.8)    IV Piggyback 100    Total Intake(mL/kg) 1300 (16) 240 (3)   Urine (mL/kg/hr) 2375 (1.2)    Blood 150    Total Output 2525    Net -1225 +240           Physical Exam: General: NAD.  Sitting up in bedside chair, calm, talkative, on O2 via [employer] Resp: No increased wob Cardio: regular rate and rhythm ABD soft Neurologically intact MSK Neurovascularly intact Sensation intact distally Intact pulses distally Dorsiflexion/Plantar flexion intact Incision: dressing C/D/I   Assessment: 1 Day Post-Op  S/P Procedure(s) (LRB): TOTAL KNEE ARTHROPLASTY (Right) by Dr. Ernesta Amble. Percell Miller on 06/29/22  Principal  Problem:   S/P total knee arthroplasty, right   Plan: Discussed her COPD likely being exacerbated by the surgery  Advance diet Up with therapy Incentive Spirometry Elevate and Apply ice  Weightbearing: WBAT RLE Insicional and dressing care: Dressings left intact until follow-up and Reinforce dressings as needed Orthopedic device(s):  CPM and bone foam Showering: Keep dressing dry VTE prophylaxis: Aspirin '81mg'$  BID  x 30 days , SCDs, ambulation Pain control: continue current regimen; already sent post op meds Follow - up plan: 2 weeks Contact information:  Edmonia Lynch MD, Aggie Moats PA-C  Dispo: Home hopefully later today is passes PT evaluation and able to keep O2 sats >90% off . If unable, then I will send her home on O2.  Addendum 06/30/22 1:34PM I have reviewed her recent O2 sats and the note from PT which  shows "Limited by drop in O2 sats with activity. O2 91% on RA at rest, O2 80% on RA with ambulation." I have determined it would be in her best interest to order [place] O2 when she is ready for discharge. Hopefully she will improve her oxygenation in the weeks to come post-op.    Britt Bottom, PA-C Office [phone] 06/30/2022, 10:09 AM

## 2022-07-01 DIAGNOSIS — J449 Chronic obstructive pulmonary disease, unspecified: Secondary | ICD-10-CM | POA: Diagnosis not present

## 2022-07-06 DIAGNOSIS — M1711 Unilateral primary osteoarthritis, right knee: Secondary | ICD-10-CM | POA: Diagnosis not present

## 2022-07-06 DIAGNOSIS — M6281 Muscle weakness (generalized): Secondary | ICD-10-CM | POA: Diagnosis not present

## 2022-07-06 DIAGNOSIS — Z96651 Presence of right artificial knee joint: Secondary | ICD-10-CM | POA: Diagnosis not present

## 2022-07-08 DIAGNOSIS — Z96651 Presence of right artificial knee joint: Secondary | ICD-10-CM | POA: Diagnosis not present

## 2022-07-08 DIAGNOSIS — M1711 Unilateral primary osteoarthritis, right knee: Secondary | ICD-10-CM | POA: Diagnosis not present

## 2022-07-08 DIAGNOSIS — M6281 Muscle weakness (generalized): Secondary | ICD-10-CM | POA: Diagnosis not present

## 2022-07-13 DIAGNOSIS — Z96651 Presence of right artificial knee joint: Secondary | ICD-10-CM | POA: Diagnosis not present

## 2022-07-13 DIAGNOSIS — M1711 Unilateral primary osteoarthritis, right knee: Secondary | ICD-10-CM | POA: Diagnosis not present

## 2022-07-13 DIAGNOSIS — M6281 Muscle weakness (generalized): Secondary | ICD-10-CM | POA: Diagnosis not present

## 2022-07-14 DIAGNOSIS — M1711 Unilateral primary osteoarthritis, right knee: Secondary | ICD-10-CM | POA: Diagnosis not present

## 2022-07-16 DIAGNOSIS — M6281 Muscle weakness (generalized): Secondary | ICD-10-CM | POA: Diagnosis not present

## 2022-07-16 DIAGNOSIS — M1711 Unilateral primary osteoarthritis, right knee: Secondary | ICD-10-CM | POA: Diagnosis not present

## 2022-07-16 DIAGNOSIS — Z96651 Presence of right artificial knee joint: Secondary | ICD-10-CM | POA: Diagnosis not present

## 2022-07-20 DIAGNOSIS — M6281 Muscle weakness (generalized): Secondary | ICD-10-CM | POA: Diagnosis not present

## 2022-07-20 DIAGNOSIS — Z96651 Presence of right artificial knee joint: Secondary | ICD-10-CM | POA: Diagnosis not present

## 2022-07-20 DIAGNOSIS — M1711 Unilateral primary osteoarthritis, right knee: Secondary | ICD-10-CM | POA: Diagnosis not present

## 2022-07-27 DIAGNOSIS — Z96651 Presence of right artificial knee joint: Secondary | ICD-10-CM | POA: Diagnosis not present

## 2022-07-27 DIAGNOSIS — M1711 Unilateral primary osteoarthritis, right knee: Secondary | ICD-10-CM | POA: Diagnosis not present

## 2022-07-27 DIAGNOSIS — M6281 Muscle weakness (generalized): Secondary | ICD-10-CM | POA: Diagnosis not present

## 2022-07-29 DIAGNOSIS — M1711 Unilateral primary osteoarthritis, right knee: Secondary | ICD-10-CM | POA: Diagnosis not present

## 2022-07-29 DIAGNOSIS — Z96651 Presence of right artificial knee joint: Secondary | ICD-10-CM | POA: Diagnosis not present

## 2022-07-29 DIAGNOSIS — M6281 Muscle weakness (generalized): Secondary | ICD-10-CM | POA: Diagnosis not present

## 2022-07-31 DIAGNOSIS — J9601 Acute respiratory failure with hypoxia: Secondary | ICD-10-CM | POA: Diagnosis not present

## 2022-08-03 DIAGNOSIS — M1711 Unilateral primary osteoarthritis, right knee: Secondary | ICD-10-CM | POA: Diagnosis not present

## 2022-08-03 DIAGNOSIS — Z96651 Presence of right artificial knee joint: Secondary | ICD-10-CM | POA: Diagnosis not present

## 2022-08-03 DIAGNOSIS — M6281 Muscle weakness (generalized): Secondary | ICD-10-CM | POA: Diagnosis not present

## 2022-08-05 DIAGNOSIS — E782 Mixed hyperlipidemia: Secondary | ICD-10-CM | POA: Diagnosis not present

## 2022-08-05 DIAGNOSIS — E1121 Type 2 diabetes mellitus with diabetic nephropathy: Secondary | ICD-10-CM | POA: Diagnosis not present

## 2022-08-05 DIAGNOSIS — I10 Essential (primary) hypertension: Secondary | ICD-10-CM | POA: Diagnosis not present

## 2022-08-05 DIAGNOSIS — Z96651 Presence of right artificial knee joint: Secondary | ICD-10-CM | POA: Diagnosis not present

## 2022-08-05 DIAGNOSIS — M6281 Muscle weakness (generalized): Secondary | ICD-10-CM | POA: Diagnosis not present

## 2022-08-05 DIAGNOSIS — M1711 Unilateral primary osteoarthritis, right knee: Secondary | ICD-10-CM | POA: Diagnosis not present

## 2022-08-05 DIAGNOSIS — J439 Emphysema, unspecified: Secondary | ICD-10-CM | POA: Diagnosis not present

## 2022-08-10 DIAGNOSIS — M6281 Muscle weakness (generalized): Secondary | ICD-10-CM | POA: Diagnosis not present

## 2022-08-10 DIAGNOSIS — I10 Essential (primary) hypertension: Secondary | ICD-10-CM | POA: Diagnosis not present

## 2022-08-10 DIAGNOSIS — E782 Mixed hyperlipidemia: Secondary | ICD-10-CM | POA: Diagnosis not present

## 2022-08-10 DIAGNOSIS — M1711 Unilateral primary osteoarthritis, right knee: Secondary | ICD-10-CM | POA: Diagnosis not present

## 2022-08-10 DIAGNOSIS — E042 Nontoxic multinodular goiter: Secondary | ICD-10-CM | POA: Diagnosis not present

## 2022-08-10 DIAGNOSIS — M25562 Pain in left knee: Secondary | ICD-10-CM | POA: Diagnosis not present

## 2022-08-10 DIAGNOSIS — Z96651 Presence of right artificial knee joint: Secondary | ICD-10-CM | POA: Diagnosis not present

## 2022-08-11 DIAGNOSIS — M1711 Unilateral primary osteoarthritis, right knee: Secondary | ICD-10-CM | POA: Diagnosis not present

## 2022-08-12 DIAGNOSIS — M6281 Muscle weakness (generalized): Secondary | ICD-10-CM | POA: Diagnosis not present

## 2022-08-12 DIAGNOSIS — M1711 Unilateral primary osteoarthritis, right knee: Secondary | ICD-10-CM | POA: Diagnosis not present

## 2022-08-12 DIAGNOSIS — Z96651 Presence of right artificial knee joint: Secondary | ICD-10-CM | POA: Diagnosis not present

## 2022-08-17 ENCOUNTER — Ambulatory Visit: Payer: Medicare Other | Admitting: Podiatry

## 2022-08-17 VITALS — BP 130/70

## 2022-08-17 DIAGNOSIS — M79674 Pain in right toe(s): Secondary | ICD-10-CM | POA: Diagnosis not present

## 2022-08-17 DIAGNOSIS — Z96651 Presence of right artificial knee joint: Secondary | ICD-10-CM | POA: Diagnosis not present

## 2022-08-17 DIAGNOSIS — M79675 Pain in left toe(s): Secondary | ICD-10-CM

## 2022-08-17 DIAGNOSIS — B351 Tinea unguium: Secondary | ICD-10-CM | POA: Diagnosis not present

## 2022-08-17 DIAGNOSIS — M1711 Unilateral primary osteoarthritis, right knee: Secondary | ICD-10-CM | POA: Diagnosis not present

## 2022-08-17 DIAGNOSIS — M6281 Muscle weakness (generalized): Secondary | ICD-10-CM | POA: Diagnosis not present

## 2022-08-17 NOTE — Progress Notes (Signed)
  Subjective:  Patient ID: [name], female    DOB: 1951-06-06,  MRN: [id number]  Chief Complaint  Patient presents with   Nail Problem    Nail trim   72 y.o. female returns for the above complaint.  Patient presents with thickened elongated dystrophic toenails x10 mild pain on palpation.  Hurts with ambulation hurts with pressure.  She would like for me to be down she is not able to do it herself.  She is a diabetic with unknown A1c  Objective:   Vitals:   08/17/22 1040  BP: 130/70   Podiatric Exam: Vascular: dorsalis pedis and posterior tibial pulses are palpable bilateral. Capillary return is immediate. Temperature gradient is WNL. Skin turgor WNL  Sensorium: Normal Semmes Weinstein monofilament test. Normal tactile sensation bilaterally. Nail Exam: Pt has thick disfigured discolored nails with subungual debris noted bilateral entire nail hallux through fifth toenails.  Pain on palpation to the nails. Ulcer Exam: There is no evidence of ulcer or pre-ulcerative changes or infection. Orthopedic Exam: Muscle tone and strength are WNL. No limitations in general ROM. No crepitus or effusions noted.  Skin: No Porokeratosis. No infection or ulcers    Assessment & Plan:   1. Pain due to onychomycosis of toenails of both feet      Patient was evaluated and treated and all questions answered.  Onychomycosis with pain  -Nails palliatively debrided as below. -Educated on self-care  Procedure: Nail Debridement Rationale: pain  Type of Debridement: manual, sharp debridement. Instrumentation: Nail nipper, rotary burr. Number of Nails: 10  Procedures and Treatment: Consent by patient was obtained for treatment procedures. The patient understood the discussion of treatment and procedures well. All questions were answered thoroughly reviewed. Debridement of mycotic and hypertrophic toenails, 1 through 5 bilateral and clearing of subungual debris. No ulceration, no infection noted.   Return Visit-Office Procedure: Patient instructed to return to the office for a follow up visit 3 months for continued evaluation and treatment.  Boneta Lucks, DPM    No follow-ups on file.

## 2022-08-19 DIAGNOSIS — M1711 Unilateral primary osteoarthritis, right knee: Secondary | ICD-10-CM | POA: Diagnosis not present

## 2022-08-19 DIAGNOSIS — Z96651 Presence of right artificial knee joint: Secondary | ICD-10-CM | POA: Diagnosis not present

## 2022-08-19 DIAGNOSIS — M6281 Muscle weakness (generalized): Secondary | ICD-10-CM | POA: Diagnosis not present

## 2022-08-26 DIAGNOSIS — M1711 Unilateral primary osteoarthritis, right knee: Secondary | ICD-10-CM | POA: Diagnosis not present

## 2022-08-26 DIAGNOSIS — Z96651 Presence of right artificial knee joint: Secondary | ICD-10-CM | POA: Diagnosis not present

## 2022-08-26 DIAGNOSIS — M6281 Muscle weakness (generalized): Secondary | ICD-10-CM | POA: Diagnosis not present

## 2022-09-02 DIAGNOSIS — Z96651 Presence of right artificial knee joint: Secondary | ICD-10-CM | POA: Diagnosis not present

## 2022-09-02 DIAGNOSIS — M6281 Muscle weakness (generalized): Secondary | ICD-10-CM | POA: Diagnosis not present

## 2022-09-02 DIAGNOSIS — M1711 Unilateral primary osteoarthritis, right knee: Secondary | ICD-10-CM | POA: Diagnosis not present

## 2022-09-07 DIAGNOSIS — M1711 Unilateral primary osteoarthritis, right knee: Secondary | ICD-10-CM | POA: Diagnosis not present

## 2022-09-07 DIAGNOSIS — Z96651 Presence of right artificial knee joint: Secondary | ICD-10-CM | POA: Diagnosis not present

## 2022-09-07 DIAGNOSIS — M6281 Muscle weakness (generalized): Secondary | ICD-10-CM | POA: Diagnosis not present

## 2022-09-09 DIAGNOSIS — M1711 Unilateral primary osteoarthritis, right knee: Secondary | ICD-10-CM | POA: Diagnosis not present

## 2022-09-09 DIAGNOSIS — M6281 Muscle weakness (generalized): Secondary | ICD-10-CM | POA: Diagnosis not present

## 2022-09-09 DIAGNOSIS — Z96651 Presence of right artificial knee joint: Secondary | ICD-10-CM | POA: Diagnosis not present

## 2022-09-16 DIAGNOSIS — H53001 Unspecified amblyopia, right eye: Secondary | ICD-10-CM | POA: Diagnosis not present

## 2022-09-16 DIAGNOSIS — H26493 Other secondary cataract, bilateral: Secondary | ICD-10-CM | POA: Diagnosis not present

## 2022-09-28 ENCOUNTER — Telehealth: Payer: Self-pay | Admitting: *Deleted

## 2022-09-28 ENCOUNTER — Other Ambulatory Visit: Payer: Self-pay | Admitting: *Deleted

## 2022-09-28 DIAGNOSIS — Z122 Encounter for screening for malignant neoplasm of respiratory organs: Secondary | ICD-10-CM

## 2022-09-28 DIAGNOSIS — F17210 Nicotine dependence, cigarettes, uncomplicated: Secondary | ICD-10-CM

## 2022-09-28 DIAGNOSIS — Z87891 Personal history of nicotine dependence: Secondary | ICD-10-CM

## 2022-09-28 NOTE — Telephone Encounter (Signed)
Spoke with patient Scheduled SD[facility] 10/26/2022 3pm CT scheduled 10/27/2022 3pm '@GI'$  Pt voiced understanding and had no further questions.

## 2022-10-18 DIAGNOSIS — Z780 Asymptomatic menopausal state: Secondary | ICD-10-CM | POA: Diagnosis not present

## 2022-10-18 DIAGNOSIS — M85851 Other specified disorders of bone density and structure, right thigh: Secondary | ICD-10-CM | POA: Diagnosis not present

## 2022-10-26 ENCOUNTER — Encounter: Payer: Self-pay | Admitting: Acute Care

## 2022-10-26 ENCOUNTER — Ambulatory Visit (INDEPENDENT_AMBULATORY_CARE_PROVIDER_SITE_OTHER): Payer: Medicare Other | Admitting: Acute Care

## 2022-10-26 DIAGNOSIS — F17210 Nicotine dependence, cigarettes, uncomplicated: Secondary | ICD-10-CM | POA: Diagnosis not present

## 2022-10-26 NOTE — Patient Instructions (Signed)
Thank you for participating in the [facility]. It was our pleasure to meet you today. We will call you with the results of your scan within the next few days. Your scan will be assigned a Lung RADS category score by the physicians reading the scans.  This Lung RADS score determines follow up scanning.  See below for description of categories, and follow up screening recommendations. We will be in touch to schedule your follow up screening annually or based on recommendations of our providers. We will fax a copy of your scan results to your Primary Care Physician, or the physician who referred you to the program, to ensure they have the results. Please call the office if you have any questions or concerns regarding your scanning experience or results.  Our office number is [phone]. Please speak with Denise Phelps, RN. , or  Denise Buckner RN, They are  our Lung Cancer Screening RN.'s If They are unavailable when you call, Please leave a message on the voice mail. We will return your call at our earliest convenience.This voice mail is monitored several times a day.  Remember, if your scan is normal, we will scan you annually as long as you continue to meet the criteria for the program. (Age 50-80, Current smoker or smoker who has quit within the last 15 years). If you are a smoker, remember, quitting is the single most powerful action that you can take to decrease your risk of lung cancer and other pulmonary, breathing related problems. We know quitting is hard, and we are here to help.  Please let us know if there is anything we can do to help you meet your goal of quitting. If you are a former smoker, congratulations. We are proud of you! Remain smoke free! Remember you can refer friends or family members through the number above.  We will screen them to make sure they meet criteria for the program. Thank you for helping us take better care of you by  participating in Lung Screening.  You can receive free nicotine replacement therapy ( patches, gum or mints) by calling 1-800-QUIT NOW. Please call so we can get you on the path to becoming  a non-smoker. I know it is hard, but you can do this!  Lung RADS Categories:  Lung RADS 1: no nodules or definitely non-concerning nodules.  Recommendation is for a repeat annual scan in 12 months.  Lung RADS 2:  nodules that are non-concerning in appearance and behavior with a very low likelihood of becoming an active cancer. Recommendation is for a repeat annual scan in 12 months.  Lung RADS 3: nodules that are probably non-concerning , includes nodules with a low likelihood of becoming an active cancer.  Recommendation is for a 6-month repeat screening scan. Often noted after an upper respiratory illness. We will be in touch to make sure you have no questions, and to schedule your 6-month scan.  Lung RADS 4 A: nodules with concerning findings, recommendation is most often for a follow up scan in 3 months or additional testing based on our provider's assessment of the scan. We will be in touch to make sure you have no questions and to schedule the recommended 3 month follow up scan.  Lung RADS 4 B:  indicates findings that are concerning. We will be in touch with you to schedule additional diagnostic testing based on our provider's  assessment of the scan.  Other options for assistance in smoking cessation (   As covered by your insurance benefits)  Hypnosis for smoking cessation  [employer] [phone]  Acupuncture for smoking cessation  [facility] [phone]   

## 2022-10-26 NOTE — Progress Notes (Signed)
Virtual Visit via Telephone Note  I connected with [name] on 10/26/22 at  3:00 PM EDT by telephone and verified that I am speaking with the correct person using two identifiers.  Location: Patient:  At home Provider: [place], [place], [place], [address]    I discussed the limitations, risks, security and privacy concerns of performing an evaluation and management service by telephone and the availability of in person appointments. I also discussed with the patient that there may be a patient responsible charge related to this service. The patient expressed understanding and agreed to proceed.   Shared Decision Making Visit Lung Cancer Screening Program [phone])   Eligibility: Age 72 y.o. Pack Years Smoking History Calculation 53 pack year smoking history (# packs/per year x # years smoked) Recent History of coughing up blood  no Unexplained weight loss? no ( >Than 15 pounds within the last 6 months ) Prior History Lung / other cancer no (Diagnosis within the last 5 years already requiring surveillance chest CT Scans). Smoking Status Current Smoker Former Smokers: Years since quit:  NA  Quit Date:  NA  Visit Components: Discussion included one or more decision making aids. yes Discussion included risk/benefits of screening. yes Discussion included potential follow up diagnostic testing for abnormal scans. yes Discussion included meaning and risk of over diagnosis. yes Discussion included meaning and risk of False Positives. yes Discussion included meaning of total radiation exposure. yes  Counseling Included: Importance of adherence to annual lung cancer LDCT screening. yes Impact of comorbidities on ability to participate in the program. yes Ability and willingness to under diagnostic treatment. yes  Smoking Cessation Counseling: Current Smokers:  Discussed importance of smoking cessation. yes Information about tobacco cessation classes and  interventions provided to patient. yes Patient provided with "ticket" for LDCT Scan. yes Symptomatic Patient. no  Counseling NA Diagnosis Code: Tobacco Use Z72.0 Asymptomatic Patient yes  Counseling (Intermediate counseling: > three minutes counseling) [id number] Former Smokers:  Discussed the importance of maintaining cigarette abstinence. yes Diagnosis Code: Personal History of Nicotine Dependence. [id number] Information about tobacco cessation classes and interventions provided to patient. Yes Patient provided with "ticket" for LDCT Scan. yes Written Order for Lung Cancer Screening with LDCT placed in Epic. Yes (CT Chest Lung Cancer Screening Low Dose W/O CM) [id number] Z12.2-Screening of respiratory organs Z87.891-Personal history of nicotine dependence  I have spent 25 minutes of face to face/ virtual visit   time with  [name] discussing the risks and benefits of lung cancer screening. We viewed / discussed a power point together that explained in detail the above noted topics. We paused at intervals to allow for questions to be asked and answered to ensure understanding.We discussed that the single most powerful action that she can take to decrease her risk of developing lung cancer is to quit smoking. We discussed whether or not she is ready to commit to setting a quit date. We discussed options for tools to aid in quitting smoking including nicotine replacement therapy, non-nicotine medications, support groups, Quit Smart classes, and behavior modification. We discussed that often times setting smaller, more achievable goals, such as eliminating 1 cigarette a day for a week and then 2 cigarettes a day for a week can be helpful in slowly decreasing the number of cigarettes smoked. This allows for a sense of accomplishment as well as providing a clinical benefit. I provided  her  with smoking cessation  information  with contact information for community resources, classes,  free nicotine replacement  therapy, and access to mobile apps, text messaging, and on-line smoking cessation help. I have also provided  her  the office contact information in the event she needs to contact me, or the screening staff. We discussed the time and location of the scan, and that either Doroteo Glassman RN, Joella Prince, RN  or I will call / send a letter with the results within 24-72 hours of receiving them. The patient verbalized understanding of all of  the above and had no further questions upon leaving the office. They have my contact information in the event they have any further questions.  I spent 3 minutes counseling on smoking cessation and the health risks of continued tobacco abuse.  I explained to the patient that there has been a high incidence of coronary artery disease noted on these exams. I explained that this is a non-gated exam therefore degree or severity cannot be determined. This patient is on statin therapy. I have asked the patient to follow-up with their PCP regarding any incidental finding of coronary artery disease and management with diet or medication as their PCP  feels is clinically indicated. The patient verbalized understanding of the above and had no further questions upon completion of the visit.      Magdalen Spatz, NP 10/26/2022 3:28 PM

## 2022-10-27 ENCOUNTER — Ambulatory Visit
Admission: RE | Admit: 2022-10-27 | Discharge: 2022-10-27 | Disposition: A | Discharge status: Home / Self Care | Payer: Medicare Other | Source: Ambulatory Visit | Attending: Acute Care | Admitting: Acute Care

## 2022-10-27 DIAGNOSIS — F17210 Nicotine dependence, cigarettes, uncomplicated: Secondary | ICD-10-CM | POA: Diagnosis not present

## 2022-10-27 DIAGNOSIS — Z87891 Personal history of nicotine dependence: Secondary | ICD-10-CM

## 2022-10-27 DIAGNOSIS — Z122 Encounter for screening for malignant neoplasm of respiratory organs: Secondary | ICD-10-CM

## 2022-10-29 ENCOUNTER — Other Ambulatory Visit: Payer: Self-pay

## 2022-10-29 DIAGNOSIS — Z87891 Personal history of nicotine dependence: Secondary | ICD-10-CM

## 2022-10-29 DIAGNOSIS — F17210 Nicotine dependence, cigarettes, uncomplicated: Secondary | ICD-10-CM

## 2022-11-04 DIAGNOSIS — M1711 Unilateral primary osteoarthritis, right knee: Secondary | ICD-10-CM | POA: Diagnosis not present

## 2022-11-04 DIAGNOSIS — M6281 Muscle weakness (generalized): Secondary | ICD-10-CM | POA: Diagnosis not present

## 2022-11-04 DIAGNOSIS — Z96651 Presence of right artificial knee joint: Secondary | ICD-10-CM | POA: Diagnosis not present

## 2022-11-08 DIAGNOSIS — Z96651 Presence of right artificial knee joint: Secondary | ICD-10-CM | POA: Diagnosis not present

## 2022-11-08 DIAGNOSIS — M1711 Unilateral primary osteoarthritis, right knee: Secondary | ICD-10-CM | POA: Diagnosis not present

## 2022-11-08 DIAGNOSIS — M6281 Muscle weakness (generalized): Secondary | ICD-10-CM | POA: Diagnosis not present

## 2022-11-17 ENCOUNTER — Ambulatory Visit (INDEPENDENT_AMBULATORY_CARE_PROVIDER_SITE_OTHER): Payer: Medicare Other | Admitting: Podiatry

## 2022-11-17 DIAGNOSIS — B351 Tinea unguium: Secondary | ICD-10-CM

## 2022-11-17 DIAGNOSIS — M79674 Pain in right toe(s): Secondary | ICD-10-CM

## 2022-11-17 DIAGNOSIS — M79675 Pain in left toe(s): Secondary | ICD-10-CM

## 2022-11-17 NOTE — Progress Notes (Signed)
  Subjective:  Patient ID: [name], female    DOB: 09-21-1950,  MRN: [id number]  Chief Complaint  Patient presents with   Diabetes   72 y.o. female returns for the above complaint.  Patient presents with thickened elongated dystrophic toenails x10 mild pain on palpation.  Hurts with ambulation hurts with pressure.  She would like for me to be down she is not able to do it herself.  She is a diabetic with unknown A1c  Objective:   There were no vitals filed for this visit.  Podiatric Exam: Vascular: dorsalis pedis and posterior tibial pulses are palpable bilateral. Capillary return is immediate. Temperature gradient is WNL. Skin turgor WNL  Sensorium: Normal Semmes Weinstein monofilament test. Normal tactile sensation bilaterally. Nail Exam: Pt has thick disfigured discolored nails with subungual debris noted bilateral entire nail hallux through fifth toenails.  Pain on palpation to the nails. Ulcer Exam: There is no evidence of ulcer or pre-ulcerative changes or infection. Orthopedic Exam: Muscle tone and strength are WNL. No limitations in general ROM. No crepitus or effusions noted.  Skin: No Porokeratosis. No infection or ulcers    Assessment & Plan:   No diagnosis found.    Patient was evaluated and treated and all questions answered.  Onychomycosis with pain  -Nails palliatively debrided as below. -Educated on self-care  Procedure: Nail Debridement Rationale: pain  Type of Debridement: manual, sharp debridement. Instrumentation: Nail nipper, rotary burr. Number of Nails: 10  Procedures and Treatment: Consent by patient was obtained for treatment procedures. The patient understood the discussion of treatment and procedures well. All questions were answered thoroughly reviewed. Debridement of mycotic and hypertrophic toenails, 1 through 5 bilateral and clearing of subungual debris. No ulceration, no infection noted.  Return Visit-Office Procedure: Patient instructed  to return to the office for a follow up visit 3 months for continued evaluation and treatment.  Nicholes Rough, DPM    No follow-ups on file.

## 2022-11-18 DIAGNOSIS — M1711 Unilateral primary osteoarthritis, right knee: Secondary | ICD-10-CM | POA: Diagnosis not present

## 2022-11-18 DIAGNOSIS — M6281 Muscle weakness (generalized): Secondary | ICD-10-CM | POA: Diagnosis not present

## 2022-11-18 DIAGNOSIS — Z96651 Presence of right artificial knee joint: Secondary | ICD-10-CM | POA: Diagnosis not present

## 2022-11-23 DIAGNOSIS — M1711 Unilateral primary osteoarthritis, right knee: Secondary | ICD-10-CM | POA: Diagnosis not present

## 2022-11-23 DIAGNOSIS — Z96651 Presence of right artificial knee joint: Secondary | ICD-10-CM | POA: Diagnosis not present

## 2022-11-23 DIAGNOSIS — M6281 Muscle weakness (generalized): Secondary | ICD-10-CM | POA: Diagnosis not present

## 2022-11-25 DIAGNOSIS — M6281 Muscle weakness (generalized): Secondary | ICD-10-CM | POA: Diagnosis not present

## 2022-11-25 DIAGNOSIS — M1711 Unilateral primary osteoarthritis, right knee: Secondary | ICD-10-CM | POA: Diagnosis not present

## 2022-11-25 DIAGNOSIS — Z96651 Presence of right artificial knee joint: Secondary | ICD-10-CM | POA: Diagnosis not present

## 2022-11-30 DIAGNOSIS — M1711 Unilateral primary osteoarthritis, right knee: Secondary | ICD-10-CM | POA: Diagnosis not present

## 2022-11-30 DIAGNOSIS — M6281 Muscle weakness (generalized): Secondary | ICD-10-CM | POA: Diagnosis not present

## 2022-11-30 DIAGNOSIS — Z96651 Presence of right artificial knee joint: Secondary | ICD-10-CM | POA: Diagnosis not present

## 2022-12-02 DIAGNOSIS — M6281 Muscle weakness (generalized): Secondary | ICD-10-CM | POA: Diagnosis not present

## 2022-12-02 DIAGNOSIS — Z96651 Presence of right artificial knee joint: Secondary | ICD-10-CM | POA: Diagnosis not present

## 2022-12-02 DIAGNOSIS — M1711 Unilateral primary osteoarthritis, right knee: Secondary | ICD-10-CM | POA: Diagnosis not present

## 2022-12-07 DIAGNOSIS — M1711 Unilateral primary osteoarthritis, right knee: Secondary | ICD-10-CM | POA: Diagnosis not present

## 2022-12-07 DIAGNOSIS — Z96651 Presence of right artificial knee joint: Secondary | ICD-10-CM | POA: Diagnosis not present

## 2022-12-07 DIAGNOSIS — M6281 Muscle weakness (generalized): Secondary | ICD-10-CM | POA: Diagnosis not present

## 2022-12-09 DIAGNOSIS — M6281 Muscle weakness (generalized): Secondary | ICD-10-CM | POA: Diagnosis not present

## 2022-12-09 DIAGNOSIS — Z96651 Presence of right artificial knee joint: Secondary | ICD-10-CM | POA: Diagnosis not present

## 2022-12-09 DIAGNOSIS — M1711 Unilateral primary osteoarthritis, right knee: Secondary | ICD-10-CM | POA: Diagnosis not present

## 2022-12-14 DIAGNOSIS — M6281 Muscle weakness (generalized): Secondary | ICD-10-CM | POA: Diagnosis not present

## 2022-12-14 DIAGNOSIS — Z96651 Presence of right artificial knee joint: Secondary | ICD-10-CM | POA: Diagnosis not present

## 2022-12-14 DIAGNOSIS — M1711 Unilateral primary osteoarthritis, right knee: Secondary | ICD-10-CM | POA: Diagnosis not present

## 2022-12-16 DIAGNOSIS — M6281 Muscle weakness (generalized): Secondary | ICD-10-CM | POA: Diagnosis not present

## 2022-12-16 DIAGNOSIS — Z96651 Presence of right artificial knee joint: Secondary | ICD-10-CM | POA: Diagnosis not present

## 2022-12-16 DIAGNOSIS — M1711 Unilateral primary osteoarthritis, right knee: Secondary | ICD-10-CM | POA: Diagnosis not present

## 2022-12-20 DIAGNOSIS — Z96651 Presence of right artificial knee joint: Secondary | ICD-10-CM | POA: Diagnosis not present

## 2022-12-20 DIAGNOSIS — M6281 Muscle weakness (generalized): Secondary | ICD-10-CM | POA: Diagnosis not present

## 2022-12-20 DIAGNOSIS — M1711 Unilateral primary osteoarthritis, right knee: Secondary | ICD-10-CM | POA: Diagnosis not present

## 2022-12-23 DIAGNOSIS — M6281 Muscle weakness (generalized): Secondary | ICD-10-CM | POA: Diagnosis not present

## 2022-12-23 DIAGNOSIS — Z96651 Presence of right artificial knee joint: Secondary | ICD-10-CM | POA: Diagnosis not present

## 2022-12-23 DIAGNOSIS — M1711 Unilateral primary osteoarthritis, right knee: Secondary | ICD-10-CM | POA: Diagnosis not present

## 2022-12-28 DIAGNOSIS — M6281 Muscle weakness (generalized): Secondary | ICD-10-CM | POA: Diagnosis not present

## 2022-12-28 DIAGNOSIS — M1711 Unilateral primary osteoarthritis, right knee: Secondary | ICD-10-CM | POA: Diagnosis not present

## 2022-12-28 DIAGNOSIS — Z96651 Presence of right artificial knee joint: Secondary | ICD-10-CM | POA: Diagnosis not present

## 2022-12-30 DIAGNOSIS — Z96651 Presence of right artificial knee joint: Secondary | ICD-10-CM | POA: Diagnosis not present

## 2022-12-30 DIAGNOSIS — M1711 Unilateral primary osteoarthritis, right knee: Secondary | ICD-10-CM | POA: Diagnosis not present

## 2022-12-30 DIAGNOSIS — M6281 Muscle weakness (generalized): Secondary | ICD-10-CM | POA: Diagnosis not present

## 2023-02-25 ENCOUNTER — Ambulatory Visit: Payer: Medicare Other | Admitting: Podiatry

## 2023-02-25 DIAGNOSIS — E559 Vitamin D deficiency, unspecified: Secondary | ICD-10-CM | POA: Diagnosis not present

## 2023-02-25 DIAGNOSIS — B351 Tinea unguium: Secondary | ICD-10-CM

## 2023-02-25 DIAGNOSIS — M79674 Pain in right toe(s): Secondary | ICD-10-CM

## 2023-02-25 DIAGNOSIS — I700 Atherosclerosis of aorta: Secondary | ICD-10-CM | POA: Diagnosis not present

## 2023-02-25 DIAGNOSIS — F17210 Nicotine dependence, cigarettes, uncomplicated: Secondary | ICD-10-CM | POA: Diagnosis not present

## 2023-02-25 DIAGNOSIS — F331 Major depressive disorder, recurrent, moderate: Secondary | ICD-10-CM | POA: Diagnosis not present

## 2023-02-25 DIAGNOSIS — E782 Mixed hyperlipidemia: Secondary | ICD-10-CM | POA: Diagnosis not present

## 2023-02-25 DIAGNOSIS — M79675 Pain in left toe(s): Secondary | ICD-10-CM | POA: Diagnosis not present

## 2023-02-25 DIAGNOSIS — Z0000 Encounter for general adult medical examination without abnormal findings: Secondary | ICD-10-CM | POA: Diagnosis not present

## 2023-02-25 DIAGNOSIS — Z79899 Other long term (current) drug therapy: Secondary | ICD-10-CM | POA: Diagnosis not present

## 2023-02-25 DIAGNOSIS — E042 Nontoxic multinodular goiter: Secondary | ICD-10-CM | POA: Diagnosis not present

## 2023-02-25 DIAGNOSIS — E1121 Type 2 diabetes mellitus with diabetic nephropathy: Secondary | ICD-10-CM | POA: Diagnosis not present

## 2023-02-25 NOTE — Progress Notes (Signed)
  Subjective:  Patient ID: [name], female    DOB: 05/12/1951,  MRN: [id number]  Chief Complaint  Patient presents with   Diabetes    [facility]    72 y.o. female returns for the above complaint.  Patient presents with thickened elongated dystrophic toenails x10 mild pain on palpation.  Hurts with ambulation hurts with pressure.  She would like for me to be down she is not able to do it herself.  She is a diabetic with unknown A1c  Objective:   There were no vitals filed for this visit.  Podiatric Exam: Vascular: dorsalis pedis and posterior tibial pulses are palpable bilateral. Capillary return is immediate. Temperature gradient is WNL. Skin turgor WNL  Sensorium: Normal Semmes Weinstein monofilament test. Normal tactile sensation bilaterally. Nail Exam: Pt has thick disfigured discolored nails with subungual debris noted bilateral entire nail hallux through fifth toenails.  Pain on palpation to the nails. Ulcer Exam: There is no evidence of ulcer or pre-ulcerative changes or infection. Orthopedic Exam: Muscle tone and strength are WNL. No limitations in general ROM. No crepitus or effusions noted.  Skin: No Porokeratosis. No infection or ulcers    Assessment & Plan:   No diagnosis found.    Patient was evaluated and treated and all questions answered.  Onychomycosis with pain  -Nails palliatively debrided as below. -Educated on self-care  Procedure: Nail Debridement Rationale: pain  Type of Debridement: manual, sharp debridement. Instrumentation: Nail nipper, rotary burr. Number of Nails: 10  Procedures and Treatment: Consent by patient was obtained for treatment procedures. The patient understood the discussion of treatment and procedures well. All questions were answered thoroughly reviewed. Debridement of mycotic and hypertrophic toenails, 1 through 5 bilateral and clearing of subungual debris. No ulceration, no infection noted.  Return Visit-Office Procedure: Patient  instructed to return to the office for a follow up visit 3 months for continued evaluation and treatment.  Nicholes Rough, DPM    No follow-ups on file.

## 2023-03-04 DIAGNOSIS — F331 Major depressive disorder, recurrent, moderate: Secondary | ICD-10-CM | POA: Diagnosis not present

## 2023-03-04 DIAGNOSIS — E782 Mixed hyperlipidemia: Secondary | ICD-10-CM | POA: Diagnosis not present

## 2023-03-04 DIAGNOSIS — E559 Vitamin D deficiency, unspecified: Secondary | ICD-10-CM | POA: Diagnosis not present

## 2023-03-04 DIAGNOSIS — E1121 Type 2 diabetes mellitus with diabetic nephropathy: Secondary | ICD-10-CM | POA: Diagnosis not present

## 2023-05-17 DIAGNOSIS — Z980 Intestinal bypass and anastomosis status: Secondary | ICD-10-CM | POA: Diagnosis not present

## 2023-05-17 DIAGNOSIS — D175 Benign lipomatous neoplasm of intra-abdominal organs: Secondary | ICD-10-CM | POA: Diagnosis not present

## 2023-05-17 DIAGNOSIS — D123 Benign neoplasm of transverse colon: Secondary | ICD-10-CM | POA: Diagnosis not present

## 2023-05-17 DIAGNOSIS — D122 Benign neoplasm of ascending colon: Secondary | ICD-10-CM | POA: Diagnosis not present

## 2023-05-17 DIAGNOSIS — Z860100 Personal history of colon polyps, unspecified: Secondary | ICD-10-CM | POA: Diagnosis not present

## 2023-05-17 DIAGNOSIS — D120 Benign neoplasm of cecum: Secondary | ICD-10-CM | POA: Diagnosis not present

## 2023-05-17 DIAGNOSIS — Z09 Encounter for follow-up examination after completed treatment for conditions other than malignant neoplasm: Secondary | ICD-10-CM | POA: Diagnosis not present

## 2023-05-17 DIAGNOSIS — Z85038 Personal history of other malignant neoplasm of large intestine: Secondary | ICD-10-CM | POA: Diagnosis not present

## 2023-05-17 DIAGNOSIS — D125 Benign neoplasm of sigmoid colon: Secondary | ICD-10-CM | POA: Diagnosis not present

## 2023-05-17 DIAGNOSIS — D124 Benign neoplasm of descending colon: Secondary | ICD-10-CM | POA: Diagnosis not present

## 2023-06-01 ENCOUNTER — Encounter: Payer: Self-pay | Admitting: Podiatry

## 2023-06-01 ENCOUNTER — Ambulatory Visit (INDEPENDENT_AMBULATORY_CARE_PROVIDER_SITE_OTHER): Payer: Medicare Other | Admitting: Podiatry

## 2023-06-01 ENCOUNTER — Ambulatory Visit (INDEPENDENT_AMBULATORY_CARE_PROVIDER_SITE_OTHER): Payer: Medicare Other

## 2023-06-01 DIAGNOSIS — M19072 Primary osteoarthritis, left ankle and foot: Secondary | ICD-10-CM | POA: Diagnosis not present

## 2023-06-01 DIAGNOSIS — M778 Other enthesopathies, not elsewhere classified: Secondary | ICD-10-CM

## 2023-06-01 DIAGNOSIS — Z01818 Encounter for other preprocedural examination: Secondary | ICD-10-CM | POA: Diagnosis not present

## 2023-06-01 NOTE — Progress Notes (Signed)
Subjective:  Patient ID: [name], female    DOB: 1951-06-22,  MRN: [id number]  Chief Complaint  Patient presents with   [facility]    [facility]-Has question about foot surgery    72 y.o. female presents with the above complaint.  Patient presents with left severe bunion deformity with under underlying intra-articular first MPJ pain.  She states she is having a lot of pain at the joint.  She wanted to get it evaluated she has tried shoe gear modification padding protecting offloading none of which has helped.  They would like to discuss surgical options at this time.  She does smoke encouraged her to decrease smoking.  Possible 7 out of 10 dull aching nature hurts with ambulation worse with pressure   Review of Systems: Negative except as noted in the HPI. Denies N/V/F/Ch.  Past Medical History:  Diagnosis Date   Alcohol abuse, in remission    Anxiety    Cancer of sigmoid colon (HCC) 07/2005   T2N0M0   Chronic bronchitis    COPD (chronic obstructive pulmonary disease) (HCC)    Depression    Diabetes mellitus without complication (HCC)    GERD (gastroesophageal reflux disease)    History of thyroid nodule    monitoring   Hyperlipidemia    Hypertension    Migraines    OA (osteoarthritis)    PONV (postoperative nausea and vomiting)    Reflux     Current Outpatient Medications:    acetaminophen (TYLENOL) 500 MG tablet, Take 2 tablets (1,000 mg total) by mouth every 6 (six) hours as needed for mild pain or moderate pain., Disp: 60 tablet, Rfl: 0   amitriptyline (ELAVIL) 25 MG tablet, Take 100 mg by mouth at bedtime., Disp: , Rfl:    ARIPiprazole (ABILIFY) 15 MG tablet, Take 15 mg by mouth at bedtime., Disp: , Rfl:    aspirin EC 81 MG tablet, Take 1 tablet (81 mg total) by mouth 2 (two) times daily. To prevent blood clots for 30 days after surgery., Disp: 60 tablet, Rfl: 0   calcium carbonate (TUMS - DOSED IN MG ELEMENTAL CALCIUM) 500 MG chewable tablet, Chew 2 tablets by mouth 4 (four)  times daily as needed for indigestion or heartburn., Disp: , Rfl:    Cholecalciferol (VITAMIN D) 2000 units CAPS, Take 2,000 Units by mouth at bedtime., Disp: , Rfl:    Cyanocobalamin (VITAMIN B-12) 5000 MCG TBDP, Take 5,000 mcg by mouth at bedtime., Disp: , Rfl:    famotidine (PEPCID) 20 MG tablet, Take 20 mg by mouth in the morning., Disp: , Rfl:    losartan (COZAAR) 25 MG tablet, Take 1 tablet (25 mg total) by mouth daily. (Patient taking differently: Take 25 mg by mouth every evening.), Disp: 90 tablet, Rfl: 3   meloxicam (MOBIC) 15 MG tablet, Take 1 tablet (15 mg total) by mouth daily as needed for pain (and inflammation)., Disp: 30 tablet, Rfl: 0   metFORMIN (GLUCOPHAGE-XR) 500 MG 24 hr tablet, Take 1,000 mg by mouth every evening., Disp: , Rfl:    methocarbamol (ROBAXIN-750) 750 MG tablet, Take 1 tablet (750 mg total) by mouth every 8 (eight) hours as needed for muscle spasms., Disp: 20 tablet, Rfl: 0   ondansetron (ZOFRAN-ODT) 4 MG disintegrating tablet, Take 1 tablet (4 mg total) by mouth 2 (two) times daily as needed for nausea or vomiting., Disp: 10 tablet, Rfl: 0   oxyCODONE (ROXICODONE) 5 MG immediate release tablet, Take 1-2 tablets (5-10 mg total) by mouth every  6 (six) hours as needed for severe pain., Disp: 35 tablet, Rfl: 0   PARoxetine (PAXIL) 30 MG tablet, Take 30 mg by mouth every morning., Disp: , Rfl:    rosuvastatin (CRESTOR) 20 MG tablet, Take 1 tablet (20 mg total) by mouth at bedtime., Disp: 90 tablet, Rfl: 3   sennosides-docusate sodium (SENOKOT-S) 8.6-50 MG tablet, Take 2 tablets by mouth daily as needed for constipation (while taking narcotics)., Disp: 30 tablet, Rfl: 1   gabapentin (NEURONTIN) 100 MG capsule, Take 1 capsule (100 mg total) by mouth 3 (three) times daily. (Patient taking differently: Take 100 mg by mouth 2 (two) times daily.), Disp: 90 capsule, Rfl: 6   omeprazole (PRILOSEC OTC) 20 MG tablet, Take 1 tablet (20 mg total) by mouth daily. For gastric  protection, Disp: 30 tablet, Rfl: 0  Social History   Tobacco Use  Smoking Status Every Day   Current packs/day: 0.75   Average packs/day: 0.8 packs/day for 53.8 years (40.4 ttl pk-yrs)   Types: Cigarettes, E-cigarettes   Start date: 1971  Smokeless Tobacco Never  Tobacco Comments   0.5 ppd of cigerettes , using e cigarette     Allergies  Allergen Reactions   Benadryl [Diphenhydramine Hcl] Nausea And Vomiting and Other (See Comments)    Migraine headaches.   Lansoprazole Nausea And Vomiting and Other (See Comments)    Migraine headache   Morphine Sulfate Nausea And Vomiting and Other (See Comments)    Migraine headaches.   Penicillins Nausea And Vomiting and Other (See Comments)    INTOLERANCE >  NAUSEA & VOMITING YEAST INFECTION > CHANGE IN NORMAL FLORA  Has patient had a PCN reaction causing immediate rash, facial/tongue/throat swelling, SOB or lightheadedness with hypotension:No Has patient had a PCN reaction causing severe rash involving mucus membranes or skin necrosis: No Has patient had a PCN reaction that required hospitalization No Has patient had a PCN reaction occurring within the last 10 years:No    Red Dye #40 (Allura Red) Other (See Comments)    Also allergic to [facility] Dye-migraine headaches.   Simvastatin Nausea And Vomiting and Other (See Comments)    Migraine    Codeine Nausea And Vomiting   Shellfish Allergy Other (See Comments)    Congestion   Spiriva Respimat [Tiotropium Bromide Monohydrate] Cough   Sulfa Antibiotics Nausea And Vomiting   Objective:  There were no vitals filed for this visit. There is no height or weight on file to calculate BMI. Constitutional Well developed. Well nourished.  Vascular Dorsalis pedis pulses palpable bilaterally. Posterior tibial pulses palpable bilaterally. Capillary refill normal to all digits.  No cyanosis or clubbing noted. Pedal hair growth normal.  Neurologic Normal speech. Oriented to person, place, and  time. Epicritic sensation to light touch grossly present bilaterally.  Dermatologic Nails well groomed and normal in appearance. No open wounds. No skin lesions.  Orthopedic: Pain on palpation left first metatarsophalangeal joint deep intra-articular pain noted pain with range of motion of the joint limited range of motion noted of first MPJ.  Severe bunion deformity noted.   Radiographs: 3 views of skeletally mature left foot: Osteoarthritis of the first MPJ noted with severe bunion noted.  Cystic changes noted to the first IPJ.  Hammertoe contracture of second toe noted Assessment:   1. Arthritis of first metatarsophalangeal (MTP) joint of left foot   2. Encounter for preoperative examination for general surgical procedure    Plan:  Patient was evaluated and treated and all questions answered.  Left  first MPJ arthritis with underlying severe bunion deformity -All questions and concerns were discussed with the patient extensive detail -Given the amount of pain that she is experiencing in setting of failing conservative care including shoe gear modification padding protecting offloading patient will benefit from surgical intervention at this time.  I discussed with patient she states understand like to proceed with surgery -I discussed with her she will need first metatarsophalangeal joint fusion to help reduce the bunion deformity as well as addressing underlying arthritis she states understanding. -Informed surgical risk consent was reviewed and read aloud to the patient.  I reviewed the films.  I have discussed my findings with the patient in great detail.  I have discussed all risks including but not limited to infection, stiffness, scarring, limp, disability, deformity, damage to blood vessels and nerves, numbness, poor healing, need for braces, arthritis, chronic pain, amputation, death.  All benefits and realistic expectations discussed in great detail.  I have made no promises as to the  outcome.  I have provided realistic expectations.  I have offered the patient a 2nd opinion, which they have declined and assured me they preferred to proceed despite the risks   No follow-ups on file.

## 2023-06-06 DIAGNOSIS — D692 Other nonthrombocytopenic purpura: Secondary | ICD-10-CM | POA: Diagnosis not present

## 2023-06-06 DIAGNOSIS — K219 Gastro-esophageal reflux disease without esophagitis: Secondary | ICD-10-CM | POA: Diagnosis not present

## 2023-06-10 DIAGNOSIS — E1169 Type 2 diabetes mellitus with other specified complication: Secondary | ICD-10-CM | POA: Diagnosis not present

## 2023-06-10 DIAGNOSIS — F331 Major depressive disorder, recurrent, moderate: Secondary | ICD-10-CM | POA: Diagnosis not present

## 2023-06-10 DIAGNOSIS — I10 Essential (primary) hypertension: Secondary | ICD-10-CM | POA: Diagnosis not present

## 2023-06-15 DIAGNOSIS — M21612 Bunion of left foot: Secondary | ICD-10-CM | POA: Diagnosis not present

## 2023-07-14 ENCOUNTER — Telehealth: Payer: Self-pay | Admitting: Podiatry

## 2023-07-14 NOTE — Telephone Encounter (Signed)
DOS-08/15/23  HALLUX MPJ FUSION [id number]  [employer] EFFECTIVE DATE- 08/02/22  DEDUCTIBLE -$0.00 WITH REMAINING $0.00 OOP-$3500.00 WITH REMAINING $2,865.00    SPOKE WITH JEFF D FROM [employer] AND HE STATED THAT PRIOR AUTH IS NOT REQUIRED FOR CPT CODE 47829.  CALL REFERENCE #: [id number] 07/14/23 @ 9:16 AM EST

## 2023-07-22 DIAGNOSIS — Z9181 History of falling: Secondary | ICD-10-CM | POA: Diagnosis not present

## 2023-07-22 DIAGNOSIS — E119 Type 2 diabetes mellitus without complications: Secondary | ICD-10-CM | POA: Diagnosis not present

## 2023-07-22 DIAGNOSIS — E1169 Type 2 diabetes mellitus with other specified complication: Secondary | ICD-10-CM | POA: Diagnosis not present

## 2023-07-22 DIAGNOSIS — M19072 Primary osteoarthritis, left ankle and foot: Secondary | ICD-10-CM | POA: Diagnosis not present

## 2023-07-22 DIAGNOSIS — F17200 Nicotine dependence, unspecified, uncomplicated: Secondary | ICD-10-CM | POA: Diagnosis not present

## 2023-08-05 ENCOUNTER — Other Ambulatory Visit: Payer: Medicare Other

## 2023-08-08 ENCOUNTER — Encounter
Admission: RE | Admit: 2023-08-08 | Discharge: 2023-08-08 | Disposition: A | Discharge status: Home / Self Care | Payer: Medicare Other | Source: Ambulatory Visit | Attending: Podiatry | Admitting: Podiatry

## 2023-08-08 ENCOUNTER — Other Ambulatory Visit: Payer: Self-pay

## 2023-08-08 VITALS — Ht 63.0 in | Wt 176.0 lb

## 2023-08-08 DIAGNOSIS — I10 Essential (primary) hypertension: Secondary | ICD-10-CM

## 2023-08-08 DIAGNOSIS — Z01812 Encounter for preprocedural laboratory examination: Secondary | ICD-10-CM

## 2023-08-08 DIAGNOSIS — E119 Type 2 diabetes mellitus without complications: Secondary | ICD-10-CM

## 2023-08-08 NOTE — Patient Instructions (Addendum)
 Your procedure is scheduled on: Monday January 13  Report to the Registration Desk on the 1st floor of the [facility] To find out your arrival time, please call [phone] between 1PM - 3PM on: Friday January 10  If your arrival time is 6:00 am, do not arrive before that time as the [facility] entrance doors do not open until 6:00 am.  REMEMBER: Instructions that are not followed completely may result in serious medical risk, up to and including death; or upon the discretion of your surgeon and anesthesiologist your surgery may need to be rescheduled.  Do not eat food after midnight the night before surgery.  No gum chewing or hard candies.  You may however, drink WATER  up to 2 hours before you are scheduled to arrive for your surgery. Do not drink anything within 2 hours of your scheduled arrival time.   One week prior to surgery: Monday January 6  Stop Anti-inflammatories (NSAIDS) such as Advil , Aleve, Ibuprofen , Motrin , Naproxen, Naprosyn and Aspirin  based products such as Excedrin, Goody's Powder, BC Powder. Stop ANY OVER THE COUNTER supplements until after surgery. calcium  carbonate (TUMS - DOSED IN MG ELEMENTAL CALCIUM )   You may however, continue to take Tylenol  if needed for pain up until the day of surgery.  **Follow guidelines for insulin  and diabetes medications.** metFORMIN  (GLUCOPHAGE -XR) hold 2 days prior to surgery, last dose Friday January 10    Continue taking all of your other prescription medications up until the day of surgery.  ON THE DAY OF SURGERY ONLY TAKE THESE MEDICATIONS WITH SIPS OF WATER :  omeprazole  (PRILOSEC  OTC)  gabapentin  (NEURONTIN )   No Alcohol for 24 hours before or after surgery.  No Smoking including e-cigarettes for 24 hours before surgery.  No chewable tobacco products for at least 6 hours before surgery.  No nicotine  patches on the day of surgery.  Do not use any recreational drugs for at least a week (preferably 2 weeks)  before your surgery.  Please be advised that the combination of cocaine and anesthesia may have negative outcomes, up to and including death. If you test positive for cocaine, your surgery will be cancelled.  On the morning of surgery brush your teeth with toothpaste and water , you may rinse your mouth with mouthwash if you wish. Do not swallow any toothpaste or mouthwash.  Use CHG Soap or wipes as directed on instruction sheet. ( Sent from Dr. Lawerance)   Do not wear jewelry, make-up, hairpins, clips or nail polish.  For welded (permanent) jewelry: bracelets, anklets, waist bands, etc.  Please have this removed prior to surgery.  If it is not removed, there is a chance that hospital personnel will need to cut it off on the day of surgery.  Do not wear lotions, powders, or perfumes.   Do not shave body hair from the neck down 48 hours before surgery.  Contact lenses, hearing aids and dentures may not be worn into surgery.  Do not bring valuables to the hospital. [facility] is not responsible for any missing/lost belongings or valuables.   Notify your doctor if there is any change in your medical condition (cold, fever, infection).  Wear comfortable clothing (specific to your surgery type) to the hospital.  After surgery, you can help prevent lung complications by doing breathing exercises.  Take deep breaths and cough every 1-2 hours. .  If you are being discharged the day of surgery, you will not be allowed to drive home. You will need a  responsible individual to drive you home and stay with you for 24 hours after surgery.   If you are taking public transportation, you will need to have a responsible individual with you.  Please call the Pre-admissions Testing Dept. at [phone] if you have any questions about these instructions.  Surgery Visitation Policy:  Patients having surgery or a procedure may have two visitors.  Children under the age of 49 must have an adult with  them who is not the patient.          Preparing for Surgery with CHLORHEXIDINE  GLUCONATE (CHG) Soap  Chlorhexidine  Gluconate (CHG) Soap  o An antiseptic cleaner that kills germs and bonds with the skin to continue killing germs even after washing  o Used for showering the night before surgery and morning of surgery  Before surgery, you can play an important role by reducing the number of germs on your skin.  CHG (Chlorhexidine  gluconate) soap is an antiseptic cleanser which kills germs and bonds with the skin to continue killing germs even after washing.  Please do not use if you have an allergy to CHG or antibacterial soaps. If your skin becomes reddened/irritated stop using the CHG.  1. Shower the NIGHT BEFORE SURGERY and the MORNING OF SURGERY with CHG soap.  2. If you choose to wash your hair, wash your hair first as usual with your normal shampoo.  3. After shampooing, rinse your hair and body thoroughly to remove the shampoo.  4. Use CHG as you would any other liquid soap. You can apply CHG directly to the skin and wash gently with a scrungie or a clean washcloth.  5. Apply the CHG soap to your body only from the neck down. Do not use on open wounds or open sores. Avoid contact with your eyes, ears, mouth, and genitals (private parts). Wash face and genitals (private parts) with your normal soap.  6. Wash thoroughly, paying special attention to the area where your surgery will be performed.  7. Thoroughly rinse your body with warm water .  8. Do not shower/wash with your normal soap after using and rinsing off the CHG soap.  9. Pat yourself dry with a clean towel.  10. Wear clean pajamas to bed the night before surgery.  12. Place clean sheets on your bed the night of your first shower and do not sleep with pets.  13. Shower again with the CHG soap on the day of surgery prior to arriving at the hospital.  14. Do not apply any deodorants/lotions/powders.  15.  Please wear clean clothes to the hospital.

## 2023-08-08 NOTE — Progress Notes (Signed)
 Medical Clearance on chart from 07-22-23 Dr Mila Palmer

## 2023-08-08 NOTE — H&P (View-Only) (Signed)
 Medical Clearance on chart from 07-22-23 Dr Mila Palmer

## 2023-08-09 ENCOUNTER — Encounter
Admission: RE | Admit: 2023-08-09 | Discharge: 2023-08-09 | Disposition: A | Discharge status: Home / Self Care | Payer: Medicare Other | Source: Ambulatory Visit | Attending: Podiatry | Admitting: Podiatry

## 2023-08-09 DIAGNOSIS — E119 Type 2 diabetes mellitus without complications: Secondary | ICD-10-CM | POA: Insufficient documentation

## 2023-08-09 DIAGNOSIS — Z01810 Encounter for preprocedural cardiovascular examination: Secondary | ICD-10-CM | POA: Insufficient documentation

## 2023-08-09 DIAGNOSIS — Z01812 Encounter for preprocedural laboratory examination: Secondary | ICD-10-CM

## 2023-08-09 DIAGNOSIS — I10 Essential (primary) hypertension: Secondary | ICD-10-CM | POA: Insufficient documentation

## 2023-08-12 ENCOUNTER — Encounter: Payer: Self-pay | Admitting: Podiatry

## 2023-08-15 ENCOUNTER — Other Ambulatory Visit: Payer: Self-pay

## 2023-08-15 ENCOUNTER — Ambulatory Visit: Payer: Medicare Other | Admitting: Urgent Care

## 2023-08-15 ENCOUNTER — Other Ambulatory Visit: Payer: Self-pay | Admitting: Podiatry

## 2023-08-15 ENCOUNTER — Encounter
Admission: RE | Disposition: A | Discharge status: Home / Self Care | Payer: Self-pay | Source: Home / Self Care | Attending: Podiatry

## 2023-08-15 ENCOUNTER — Ambulatory Visit: Payer: Medicare Other

## 2023-08-15 ENCOUNTER — Ambulatory Visit: Payer: Self-pay | Admitting: Anesthesiology

## 2023-08-15 ENCOUNTER — Ambulatory Visit
Admission: RE | Admit: 2023-08-15 | Discharge: 2023-08-15 | Disposition: A | Discharge status: Home / Self Care | Payer: Medicare Other | Attending: Podiatry | Admitting: Podiatry

## 2023-08-15 ENCOUNTER — Encounter: Payer: Self-pay | Admitting: Podiatry

## 2023-08-15 DIAGNOSIS — E119 Type 2 diabetes mellitus without complications: Secondary | ICD-10-CM | POA: Diagnosis not present

## 2023-08-15 DIAGNOSIS — M19072 Primary osteoarthritis, left ankle and foot: Secondary | ICD-10-CM | POA: Diagnosis not present

## 2023-08-15 DIAGNOSIS — J449 Chronic obstructive pulmonary disease, unspecified: Secondary | ICD-10-CM | POA: Insufficient documentation

## 2023-08-15 DIAGNOSIS — Z96653 Presence of artificial knee joint, bilateral: Secondary | ICD-10-CM | POA: Insufficient documentation

## 2023-08-15 DIAGNOSIS — Z79899 Other long term (current) drug therapy: Secondary | ICD-10-CM | POA: Insufficient documentation

## 2023-08-15 DIAGNOSIS — Z981 Arthrodesis status: Secondary | ICD-10-CM | POA: Diagnosis not present

## 2023-08-15 DIAGNOSIS — I10 Essential (primary) hypertension: Secondary | ICD-10-CM | POA: Diagnosis not present

## 2023-08-15 DIAGNOSIS — Z01812 Encounter for preprocedural laboratory examination: Secondary | ICD-10-CM

## 2023-08-15 DIAGNOSIS — G8918 Other acute postprocedural pain: Secondary | ICD-10-CM | POA: Diagnosis not present

## 2023-08-15 DIAGNOSIS — Z7984 Long term (current) use of oral hypoglycemic drugs: Secondary | ICD-10-CM | POA: Diagnosis not present

## 2023-08-15 DIAGNOSIS — F17210 Nicotine dependence, cigarettes, uncomplicated: Secondary | ICD-10-CM | POA: Diagnosis not present

## 2023-08-15 HISTORY — PX: ARTHRODESIS METATARSALPHALANGEAL JOINT (MTPJ): SHX6566

## 2023-08-15 LAB — GLUCOSE, CAPILLARY
Glucose-Capillary: 172 mg/dL — ABNORMAL HIGH (ref 70–99)
Glucose-Capillary: 168 mg/dL — ABNORMAL HIGH (ref 70–99)

## 2023-08-15 SURGERY — FUSION, JOINT, GREAT TOE
Anesthesia: General | Site: Toe | Laterality: Left

## 2023-08-15 MED ORDER — DEXMEDETOMIDINE HCL IN NACL 80 MCG/20ML IV SOLN
INTRAVENOUS | Status: DC | PRN
  Administered 2023-08-15 (×2): 8 ug via INTRAVENOUS

## 2023-08-15 MED ORDER — EPHEDRINE 5 MG/ML INJ
INTRAVENOUS | Status: AC
  Filled 2023-08-15: qty 5

## 2023-08-15 MED ORDER — CHLORHEXIDINE GLUCONATE 0.12 % MT SOLN: 15.0000 mL | Freq: Once | OROMUCOSAL | Status: DC

## 2023-08-15 MED ORDER — BUPIVACAINE HCL (PF) 0.5 % IJ SOLN
INTRAMUSCULAR | Status: AC
  Filled 2023-08-15: qty 30

## 2023-08-15 MED ORDER — CEFAZOLIN SODIUM-DEXTROSE 2-4 GM/100ML-% IV SOLN
INTRAVENOUS | Status: AC
  Filled 2023-08-15: qty 100

## 2023-08-15 MED ORDER — ORAL CARE MOUTH RINSE: 15.0000 mL | Freq: Once | OROMUCOSAL | Status: DC

## 2023-08-15 MED ORDER — BUPIVACAINE HCL (PF) 0.5 % IJ SOLN
INTRAMUSCULAR | Status: DC | PRN
  Administered 2023-08-15: 10 mL

## 2023-08-15 MED ORDER — IBUPROFEN 800 MG PO TABS: 800.0000 mg | ORAL_TABLET | Freq: Four times a day (QID) | ORAL | 1 refills | Status: DC | PRN

## 2023-08-15 MED ORDER — PROPOFOL 1000 MG/100ML IV EMUL
INTRAVENOUS | Status: AC
  Filled 2023-08-15: qty 100

## 2023-08-15 MED ORDER — SODIUM CHLORIDE 0.9 % IV SOLN: INTRAVENOUS | Status: DC

## 2023-08-15 MED ORDER — VASOPRESSIN 20 UNIT/ML IV SOLN
INTRAVENOUS | Status: DC | PRN
  Administered 2023-08-15 (×2): 1 m[IU] via INTRAVENOUS

## 2023-08-15 MED ORDER — CEFAZOLIN SODIUM-DEXTROSE 2-3 GM-%(50ML) IV SOLR
INTRAVENOUS | Status: DC | PRN
  Administered 2023-08-15: 2 g via INTRAVENOUS

## 2023-08-15 MED ORDER — 0.9 % SODIUM CHLORIDE (POUR BTL) OPTIME
TOPICAL | Status: DC | PRN
  Administered 2023-08-15: 250 mL

## 2023-08-15 MED ORDER — PROPOFOL 500 MG/50ML IV EMUL
INTRAVENOUS | Status: DC | PRN
  Administered 2023-08-15: 100 ug/kg/min via INTRAVENOUS

## 2023-08-15 MED ORDER — PHENYLEPHRINE HCL-NACL 20-0.9 MG/250ML-% IV SOLN
INTRAVENOUS | Status: DC | PRN
  Administered 2023-08-15: 1.333 ug/min via INTRAVENOUS

## 2023-08-15 MED ORDER — PHENYLEPHRINE 80 MCG/ML (10ML) SYRINGE FOR IV PUSH (FOR BLOOD PRESSURE SUPPORT)
PREFILLED_SYRINGE | INTRAVENOUS | Status: AC
  Filled 2023-08-15: qty 10

## 2023-08-15 MED ORDER — DEXAMETHASONE SODIUM PHOSPHATE 10 MG/ML IJ SOLN
INTRAMUSCULAR | Status: AC
Start: 2023-08-15 — End: ?
  Filled 2023-08-15: qty 1

## 2023-08-15 MED ORDER — FENTANYL CITRATE (PF) 100 MCG/2ML IJ SOLN
INTRAMUSCULAR | Status: DC | PRN
  Administered 2023-08-15: 50 ug via INTRAVENOUS
  Administered 2023-08-15: 100 ug via INTRAVENOUS
  Administered 2023-08-15: 50 ug via INTRAVENOUS

## 2023-08-15 MED ORDER — LIDOCAINE HCL (PF) 2 % IJ SOLN
INTRAMUSCULAR | Status: AC
Start: 2023-08-15 — End: ?
  Filled 2023-08-15: qty 5

## 2023-08-15 MED ORDER — PROPOFOL 10 MG/ML IV BOLUS
INTRAVENOUS | Status: DC | PRN
  Administered 2023-08-15: 150 mg via INTRAVENOUS

## 2023-08-15 MED ORDER — BUPIVACAINE HCL (PF) 0.5 % IJ SOLN
INTRAMUSCULAR | Status: AC
  Filled 2023-08-15: qty 10

## 2023-08-15 MED ORDER — FENTANYL CITRATE (PF) 100 MCG/2ML IJ SOLN
INTRAMUSCULAR | Status: AC
  Filled 2023-08-15: qty 2

## 2023-08-15 MED ORDER — LIDOCAINE HCL (PF) 1 % IJ SOLN
INTRAMUSCULAR | Status: AC
  Filled 2023-08-15: qty 5

## 2023-08-15 MED ORDER — BUPIVACAINE LIPOSOME 1.3 % IJ SUSP
INTRAMUSCULAR | Status: AC
  Filled 2023-08-15: qty 10

## 2023-08-15 MED ORDER — ONDANSETRON HCL 4 MG/2ML IJ SOLN
INTRAMUSCULAR | Status: DC | PRN
  Administered 2023-08-15: 4 mg via INTRAVENOUS

## 2023-08-15 MED ORDER — BUPIVACAINE HCL (PF) 0.5 % IJ SOLN
INTRAMUSCULAR | Status: DC | PRN
  Administered 2023-08-15: 10 mL via PERINEURAL

## 2023-08-15 MED ORDER — PHENYLEPHRINE 80 MCG/ML (10ML) SYRINGE FOR IV PUSH (FOR BLOOD PRESSURE SUPPORT)
PREFILLED_SYRINGE | INTRAVENOUS | Status: DC | PRN
  Administered 2023-08-15 (×2): 80 ug via INTRAVENOUS
  Administered 2023-08-15 (×2): 160 ug via INTRAVENOUS
  Administered 2023-08-15: 80 ug via INTRAVENOUS
  Administered 2023-08-15: 160 ug via INTRAVENOUS
  Administered 2023-08-15: 80 ug via INTRAVENOUS

## 2023-08-15 MED ORDER — DEXAMETHASONE SODIUM PHOSPHATE 10 MG/ML IJ SOLN
INTRAMUSCULAR | Status: DC | PRN
  Administered 2023-08-15: 5 mg via INTRAVENOUS

## 2023-08-15 MED ORDER — ONDANSETRON HCL 4 MG/2ML IJ SOLN
INTRAMUSCULAR | Status: AC
  Filled 2023-08-15: qty 2

## 2023-08-15 MED ORDER — VASOPRESSIN 20 UNIT/ML IV SOLN
INTRAVENOUS | Status: AC
  Filled 2023-08-15: qty 1

## 2023-08-15 MED ORDER — SODIUM CHLORIDE (PF) 0.9 % IJ SOLN
INTRAMUSCULAR | Status: AC
  Filled 2023-08-15: qty 10

## 2023-08-15 MED ORDER — OXYCODONE-ACETAMINOPHEN 5-325 MG PO TABS: 1.0000 | ORAL_TABLET | ORAL | 0 refills | Status: DC | PRN

## 2023-08-15 MED ORDER — EPHEDRINE SULFATE-NACL 50-0.9 MG/10ML-% IV SOSY
PREFILLED_SYRINGE | INTRAVENOUS | Status: DC | PRN
  Administered 2023-08-15: 5 mg via INTRAVENOUS
  Administered 2023-08-15 (×2): 10 mg via INTRAVENOUS

## 2023-08-15 MED ORDER — LIDOCAINE HCL (CARDIAC) PF 100 MG/5ML IV SOSY
PREFILLED_SYRINGE | INTRAVENOUS | Status: DC | PRN
  Administered 2023-08-15: 60 mg via INTRAVENOUS

## 2023-08-15 MED ORDER — DEXMEDETOMIDINE HCL IN NACL 80 MCG/20ML IV SOLN
INTRAVENOUS | Status: AC
  Filled 2023-08-15: qty 20

## 2023-08-15 MED ORDER — BUPIVACAINE LIPOSOME 1.3 % IJ SUSP
INTRAMUSCULAR | Status: DC | PRN
  Administered 2023-08-15: 10 mL via PERINEURAL

## 2023-08-15 MED ORDER — LIDOCAINE HCL (PF) 1 % IJ SOLN
INTRAMUSCULAR | Status: DC | PRN
  Administered 2023-08-15: 3 mL via SUBCUTANEOUS

## 2023-08-15 MED ORDER — FENTANYL CITRATE (PF) 100 MCG/2ML IJ SOLN
25.0000 ug | INTRAMUSCULAR | Status: DC | PRN
Start: 2023-08-15 — End: 2023-08-15

## 2023-08-15 SURGICAL SUPPLY — 53 items
BIT DRILL AT3 V CUTT NS (BIT) IMPLANT
BIT DRILL LEOS 2.4 (BIT) IMPLANT
BIT DRILL LEOS SN 2.0 (DRILL) IMPLANT
BIT DRILL MINI AT AC (DRILL) IMPLANT
BLADE SURG 15 STRL LF DISP TIS (BLADE) ×4 IMPLANT
BLADE SURG MINI STRL (BLADE) IMPLANT
BNDG ELASTIC 4X5.8 VLCR NS LF (GAUZE/BANDAGES/DRESSINGS) ×2 IMPLANT
BNDG ESMARCH 4X12 STRL LF (GAUZE/BANDAGES/DRESSINGS) ×2 IMPLANT
BNDG GAUZE DERMACEA FLUFF 4 (GAUZE/BANDAGES/DRESSINGS) ×2 IMPLANT
CHLORAPREP W/TINT 26 (MISCELLANEOUS) ×2 IMPLANT
CUFF TOURN SGL QUICK 12 (TOURNIQUET CUFF) IMPLANT
CUFF TOURN SGL QUICK 18X4 (TOURNIQUET CUFF) IMPLANT
DRAPE FLUOR MINI C-ARM 54X84 (DRAPES) IMPLANT
DRILL LEOS SN 2.0 (DRILL) ×1
DRILL MINI AT AC (DRILL) ×1
ELECT REM PT RETURN 9FT ADLT (ELECTROSURGICAL) ×1
ELECTRODE REM PT RTRN 9FT ADLT (ELECTROSURGICAL) ×2 IMPLANT
GAUZE SPONGE 4X4 12PLY STRL (GAUZE/BANDAGES/DRESSINGS) ×2 IMPLANT
GAUZE XEROFORM 1X8 LF (GAUZE/BANDAGES/DRESSINGS) ×2 IMPLANT
GLOVE BIO SURGEON STRL SZ7.5 (GLOVE) ×2 IMPLANT
GLOVE BIOGEL PI IND STRL 7.0 (GLOVE) ×2 IMPLANT
GLOVE SURG SYN 7.0 (GLOVE) ×1 IMPLANT
GLOVE SURG SYN 7.0 PF PI (GLOVE) ×2 IMPLANT
GOWN STRL REUS W/ TWL XL LVL3 (GOWN DISPOSABLE) ×4 IMPLANT
KIT TURNOVER KIT A (KITS) ×2 IMPLANT
KWIRE LEOS SMOOTH 1.6 (WIRE) IMPLANT
MANIFOLD NEPTUNE II (INSTRUMENTS) ×2 IMPLANT
NDL FILTER BLUNT 18X1 1/2 (NEEDLE) ×2 IMPLANT
NDL HYPO 25X1 1.5 SAFETY (NEEDLE) ×6 IMPLANT
NEEDLE FILTER BLUNT 18X1 1/2 (NEEDLE) ×1 IMPLANT
NEEDLE HYPO 25X1 1.5 SAFETY (NEEDLE) ×3 IMPLANT
NS IRRIG 500ML POUR BTL (IV SOLUTION) ×2 IMPLANT
PACK EXTREMITY ARMC (MISCELLANEOUS) ×2 IMPLANT
PAD ABD DERMACEA PRESS 5X9 (GAUZE/BANDAGES/DRESSINGS) ×2 IMPLANT
PAD CAST 4YDX4 CTTN HI CHSV (CAST SUPPLIES) ×2 IMPLANT
PLATE METAPH LEOS SM 0D LT (Screw) IMPLANT
PLATE TACK LEOS 20 (WIRE) IMPLANT
SCREW HDLS AT3 MINI 3.5X26 NS (Screw) IMPLANT
SCREW LEOS LOCK 3.5X16 (Screw) IMPLANT
SCREW LEOS NL 3.5X16 (Screw) IMPLANT
SCREW LOCK 2.7X12 (Screw) IMPLANT
SCREW NLOCK LEOS 3.5X14 (Screw) IMPLANT
STOCKINETTE M/LG 89821 (MISCELLANEOUS) ×2 IMPLANT
SUT MNCRL AB 3-0 PS2 27 (SUTURE) ×2 IMPLANT
SUT MNCRL AB 4-0 PS2 18 (SUTURE) ×2 IMPLANT
SUT MNCRL+ 5-0 UNDYED PC-3 (SUTURE) ×2 IMPLANT
SUT PROLENE 3 0 PS 2 (SUTURE) IMPLANT
SUT PROLENE 4 0 PS 2 18 (SUTURE) IMPLANT
SYR 10ML LL (SYRINGE) ×4 IMPLANT
TACK PLATE LEOS 20 (WIRE) ×2
TRAP FLUID SMOKE EVACUATOR (MISCELLANEOUS) ×2 IMPLANT
WIRE GUIDE SINGLE TROC 1.1X150 (WIRE) IMPLANT
WIRE Z .062 C-WIRE SPADE TIP (WIRE) IMPLANT

## 2023-08-15 NOTE — Transfer of Care (Signed)
 Immediate Anesthesia Transfer of Care Note  Patient: [name]  Procedure(s) Performed: ARTHRODESIS METATARSALPHALANGEAL JOINT (MTPJ) (Left: Toe)  Patient Location: PACU  Anesthesia Type:General  Level of Consciousness: drowsy and patient cooperative  Airway & Oxygen  Therapy: Patient Spontanous Breathing and Patient connected to face mask oxygen   Post-op Assessment: Report given to RN and Post -op Vital signs reviewed and stable  Post vital signs: Reviewed and stable  Last Vitals:  Vitals Value Taken Time  BP 81/48 08/15/23 0906  Temp 36.8 C 08/15/23 0900  Pulse 74 08/15/23 0913  Resp 15 08/15/23 0913  SpO2 99 % 08/15/23 0913  Vitals shown include unfiled device data.  Last Pain:  Vitals:   08/15/23 0900  TempSrc:   PainSc: Asleep         Complications: No notable events documented.

## 2023-08-15 NOTE — Interval H&P Note (Signed)
 History and Physical Interval Note:  08/15/2023 7:35 AM  [name]  has presented today for surgery, with the diagnosis of Arthritis of first metatarsophalangeal joint of left foot.  The various methods of treatment have been discussed with the patient and family. After consideration of risks, benefits and other options for treatment, the patient has consented to  Procedure(s) with comments: ARTHRODESIS METATARSALPHALANGEAL JOINT (MTPJ) (Left) - POPLITEAL/SAPHENOUS BLOCK as a surgical intervention.  The patient's history has been reviewed, patient examined, no change in status, stable for surgery.  I have reviewed the patient's chart and labs.  Questions were answered to the patient's satisfaction.     Franky SHAUNNA Blanch

## 2023-08-15 NOTE — Anesthesia Procedure Notes (Signed)
 Procedure Name: LMA Insertion Date/Time: 08/15/2023 7:44 AM  Performed by: Lorriane Arabia, CRNAPre-anesthesia Checklist: Patient identified, Patient being monitored, Timeout performed, Emergency Drugs available and Suction available Patient Re-evaluated:Patient Re-evaluated prior to induction Oxygen  Delivery Method: Circle system utilized Preoxygenation: Pre-oxygenation with 100% oxygen  Induction Type: IV induction Ventilation: Mask ventilation without difficulty LMA: LMA inserted LMA Size: 4.0 Tube type: Oral Number of attempts: 1 Placement Confirmation: positive ETCO2 and breath sounds checked- equal and bilateral Tube secured with: Tape Dental Injury: Teeth and Oropharynx as per pre-operative assessment

## 2023-08-15 NOTE — Op Note (Signed)
 Surgeon: Surgeon(s): Tobie Franky SQUIBB, DPM  Assistants: None Pre-operative diagnosis: Arthritis of first metatarsophalangeal joint of left foot  Post-operative diagnosis: same Procedure: Procedure(s) (LRB): ARTHRODESIS METATARSALPHALANGEAL JOINT (MTPJ) (Left)  Pathology: * No specimens in log *  Pertinent Intra-op findings: Significant osteoarthritis with increase in intermetatarsal angle noted Anesthesia: Choice  Hemostasis:  Total Tourniquet Time Documented: Calf (Right) - 41 minutes Total: Calf (Right) - 41 minutes  EBL: Minimal Materials: 3-0 Prolene and 3-0 Monocryl with Acumed first MPJ plate with lag screw Injectables: None Complications: None  Indications for surgery: [name] presents with left severe first MPJ pain with underlying severe bunion deformity. Patient has failed all conservative therapy including but not limited to shoe gear modification padding protecting offloading. She wishes to have surgical correction of the foot/deformity. It was determined that patient would benefit from left first metatarsophalangeal joint fusion with fixation. Informed surgical risk consent was reviewed and read aloud to the patient.  I reviewed the films.  I have discussed my findings with the patient in great detail.  I have discussed all risks including but not limited to infection, stiffness, scarring, limp, disability, deformity, damage to blood vessels and nerves, numbness, poor healing, need for braces, arthritis, chronic pain, amputation, death.  All benefits and realistic expectations discussed in great detail.  I have made no promises as to the outcome.  I have provided realistic expectations.  I have offered the patient a 2nd opinion, which they have declined and assured me they preferred to proceed despite the risks   Procedure in detail: The patient was both verbally and visually identified by myself, the nursing staff, and anesthesia staff in the preoperative holding area.  They were then transferred to the operating room and placed on the operative table in supine position.    Attention was directed to the dorsal aspect of the right first metatarsophalangeal joint where a linear skin incision approximately 6cm long was made in the skin using a #15 blade. This incision was carried down through the subcutaneous tissue taking care to clamp and cauterize all neurovascular structures as necessary.  The capsule of 1st MPJ was identified. A linear capsulotomy was then performed in-line with the original skin incision and the capsule was reflected both medially and laterally to expose the head of the first metatarsal and the base of the proximal phalanx. The articular surface of the 1st metatarsal head was noted to show evidence of [moderate] degeneration under direct visualization. The sagittal saw was then used to resect the dorsal, medial, and lateral prominences off of the 1st metatarsal. A rongeur was utilized to remove the dorsal exostosis of the proximal phalanx. A curette was used to remove some of the cartilage, and the remaining cartilage was removed using a burr until punctate bleeding could be noted on the metatarsal head and base of proximal phalanx. The site as flushed with copious amounts of sterile saline.  The distal aspect of the 1st metatarsal and the base of the proximal phalanx were then subchondrally drilled utilizing a pineapple burr. A Temporary K wire was then placed from distal-medial to proximal-lateral across the 1st MPJ, and the position was confirmed to be satisfactory utilizing fluoroscopy.. A dorsal right1st MPJ fusion locking plate was then applied and secured with two olive wires.  interfragmentary screw was inserted in lag fashion standard 4.0 cancellous.  The position was confirmed to be satisfactory with fluoroscopy, and two screws were placed both proximally and distally for a total of four  Nonlocking/locking screws utilized. The 1st MPJ was then  stressed intraoperatively and no motion or gapping was noted across the fusion site. Final imaging via fluoroscopy was taken to confirm adequate placement and rectus position of the hallux. The surgical site was copiously irrigated with sterile saline. The capsule was then reapproximated with 3-0 Monocortical in a simple interrupted fashion. The subcutaneous tissue was then reapproximated with 4-0 monocryl in a running fashion. The subcuticular was performed utilizing 5-0 Monocryl.   Attention was directed to the second and third digit of the foot A longitudinal incision was made over the proximal interphalangeal joint of the second digit of the foot. A very small incision was made and carried deep with a combination of sharp and blunt dissection to the level of the distal interphalangeal joint where longitudinal tenotomy was performed. A capsulotomy was performed revealing the head of theproximal phalanx through the wound. The head of this phalanx was sharply resected with the sagittal saw, recontoured to a normal anatomical resemblance.  It was also noted patient will need a further release at the metatarsophalangeal joint at this time using McGlamery elevator the incision incision was carried further back and using McGlamery elevator the second metatarsal head capsulotomy was performed in standard technique.  Then a 0.0 45inch Kwire was placed across the osteotomy site for stabilization this was inserted into respective metatarsal heads. correctional alignment noted as seen on fluoroscopyThe site was flushed, and the tendon was repaired with 3-0 Monocryl. The cutaneous closure was made with a 3-0 Prolene in a simple fashion.  At the conclusion of the procedure the patient was awoken from anesthesia and found to have tolerated the procedure well any complications. There were transferred to PACU with vital signs stable and vascular status intact.  [name], DPM

## 2023-08-15 NOTE — Anesthesia Procedure Notes (Signed)
 Anesthesia Regional Block: Popliteal block   Pre-Anesthetic Checklist: , timeout performed,  Correct Patient, Correct Site, Correct Laterality,  Correct Procedure, Correct Position, site marked,  Risks and benefits discussed,  Surgical consent,  Pre-op evaluation,  At surgeon's request and post-op pain management  Laterality: Lower and Left  Prep: chloraprep       Needles:  Injection technique: Single-shot  Needle Type: Echogenic Needle     Needle Length: 9cm  Needle Gauge: 21     Additional Needles:   Procedures:,,,, ultrasound used (permanent image in chart),,    Narrative:  Start time: 08/15/2023 9:44 AM End time: 08/15/2023 9:47 AM Injection made incrementally with aspirations every 5 mL.  Performed by: Personally  Anesthesiologist: Dario Barter, MD  Additional Notes: Patient consented for risk and benefits of nerve block including but not limited to nerve damage, failed block, bleeding and infection.  Patient voiced understanding.  Functioning IV was confirmed and monitors were applied.  Timeout done prior to procedure and prior to any sedation being given to the patient.  Patient confirmed procedure site prior to any sedation given to the patient.  A 50mm 22ga Stimuplex needle was used. Sterile prep,hand hygiene and sterile gloves were used.  Minimal sedation used for procedure.  No paresthesia endorsed by patient during the procedure.  Negative aspiration and negative test dose prior to incremental administration of local anesthetic. The patient tolerated the procedure well with no immediate complications.

## 2023-08-15 NOTE — Anesthesia Postprocedure Evaluation (Signed)
 Anesthesia Post Note  Patient: [name]  Procedure(s) Performed: ARTHRODESIS METATARSALPHALANGEAL JOINT (MTPJ) (Left: Toe)  Patient location during evaluation: PACU Anesthesia Type: General Level of consciousness: awake and alert Pain management: pain level controlled Vital Signs Assessment: post-procedure vital signs reviewed and stable Respiratory status: spontaneous breathing, nonlabored ventilation, respiratory function stable and patient connected to nasal cannula oxygen  Cardiovascular status: blood pressure returned to baseline and stable Postop Assessment: no apparent nausea or vomiting Anesthetic complications: no   No notable events documented.   Last Vitals:  Vitals:   08/15/23 1032 08/15/23 1057  BP: 130/74 122/66  Pulse: 81 82  Resp: 20 18  Temp: (!) 36.4 C   SpO2: 95% 95%    Last Pain:  Vitals:   08/15/23 1032  TempSrc: Temporal  PainSc: 0-No pain                 Prentice Murphy

## 2023-08-15 NOTE — Anesthesia Preprocedure Evaluation (Signed)
 Anesthesia Evaluation    History of Anesthesia Complications (+) PONV and history of anesthetic complications  Airway Mallampati: III  TM Distance: >3 FB     Dental  (+) Dental Advidsory Given, Teeth Intact Permanent bridge x3 (2 on top, 1 on bottom):   Pulmonary neg shortness of breath, neg sleep apnea, COPD, neg recent URI, Current Smoker and Patient abstained from smoking.   Pulmonary exam normal        Cardiovascular hypertension, (-) angina (-) Past MI and (-) Cardiac Stents Normal cardiovascular exam(-) dysrhythmias (-) Valvular Problems/Murmurs     Neuro/Psych  PSYCHIATRIC DISORDERS Anxiety Depression    negative neurological ROS     GI/Hepatic Neg liver ROS,GERD  ,,  Endo/Other  diabetes    Renal/GU negative Renal ROS     Musculoskeletal   Abdominal   Peds  Hematology negative hematology ROS (+)   Anesthesia Other Findings Past Medical History: No date: Alcohol abuse, in remission No date: Anxiety 07/2005: Cancer of sigmoid colon (HCC)     Comment:  T2N0M0 No date: Chronic bronchitis No date: COPD (chronic obstructive pulmonary disease) (HCC) No date: Depression No date: Diabetes mellitus without complication (HCC) No date: GERD (gastroesophageal reflux disease) No date: History of thyroid  nodule     Comment:  monitoring No date: Hyperlipidemia No date: Hypertension No date: Migraines No date: OA (osteoarthritis) No date: PONV (postoperative nausea and vomiting) No date: Reflux   Reproductive/Obstetrics                             Anesthesia Physical Anesthesia Plan  ASA: 3  Anesthesia Plan: General   Post-op Pain Management: Regional block*   Induction: Intravenous  PONV Risk Score and Plan: 3 and Treatment may vary due to age or medical condition, Propofol  infusion and TIVA  Airway Management Planned: LMA  Additional Equipment:   Intra-op Plan:    Post-operative Plan: Extubation in OR  Informed Consent:   Plan Discussed with:   Anesthesia Plan Comments:        Anesthesia Quick Evaluation

## 2023-08-17 ENCOUNTER — Encounter: Payer: Self-pay | Admitting: Podiatry

## 2023-08-19 ENCOUNTER — Encounter: Payer: Self-pay | Admitting: Podiatry

## 2023-08-24 ENCOUNTER — Encounter: Payer: Self-pay | Admitting: Podiatry

## 2023-08-24 ENCOUNTER — Ambulatory Visit (INDEPENDENT_AMBULATORY_CARE_PROVIDER_SITE_OTHER): Payer: Medicare Other

## 2023-08-24 ENCOUNTER — Ambulatory Visit (INDEPENDENT_AMBULATORY_CARE_PROVIDER_SITE_OTHER): Payer: Medicare Other | Admitting: Podiatry

## 2023-08-24 DIAGNOSIS — Z9889 Other specified postprocedural states: Secondary | ICD-10-CM | POA: Diagnosis not present

## 2023-08-24 MED ORDER — DOXYCYCLINE HYCLATE 100 MG PO TABS: 100.0000 mg | ORAL_TABLET | Freq: Two times a day (BID) | ORAL | 0 refills | Status: DC

## 2023-08-24 NOTE — Progress Notes (Signed)
Subjective:  Patient ID: [name], female    DOB: [birth year],  MRN: [id number]  Chief Complaint  Patient presents with   Routine Post Op    RM#11 POV #1 DOS 08/15/2023 LT 1ST MPJ FUSION-patient states is doing well incision has no signs of infection just bruising.Has no concerns at this time.    DOS: 08/15/23 Procedure: Left first MPJ arthrodesis  73 y.o. female returns for post-op check.  She states she is doing well denies any fevers or some bruising.  Bandages clean dry and intact  No follow-ups on file.   Review of Systems: Negative except as noted in the HPI. Denies N/V/F/Ch.  Past Medical History:  Diagnosis Date   Alcohol abuse, in remission    Anxiety    Cancer of sigmoid colon (HCC) 07/2005   T2N0M0   Chronic bronchitis    COPD (chronic obstructive pulmonary disease) (HCC)    Depression    Diabetes mellitus without complication (HCC)    GERD (gastroesophageal reflux disease)    History of thyroid nodule    monitoring   Hyperlipidemia    Hypertension    Migraines    OA (osteoarthritis)    PONV (postoperative nausea and vomiting)    Reflux     Current Outpatient Medications:    acetaminophen (TYLENOL) 500 MG tablet, Take 2 tablets (1,000 mg total) by mouth every 6 (six) hours as needed for mild pain or moderate pain., Disp: 60 tablet, Rfl: 0   amitriptyline (ELAVIL) 25 MG tablet, Take 100 mg by mouth at bedtime., Disp: , Rfl:    ARIPiprazole (ABILIFY) 20 MG tablet, Take 20 mg by mouth at bedtime., Disp: , Rfl:    calcium carbonate (TUMS - DOSED IN MG ELEMENTAL CALCIUM) 500 MG chewable tablet, Chew 2 tablets by mouth 4 (four) times daily as needed for indigestion or heartburn., Disp: , Rfl:    Cholecalciferol (VITAMIN D) 2000 units CAPS, Take 2,000 Units by mouth at bedtime., Disp: , Rfl:    doxycycline (VIBRA-TABS) 100 MG tablet, Take 1 tablet (100 mg total) by mouth 2 (two) times daily., Disp: 20 tablet, Rfl: 0   famotidine (PEPCID) 20 MG tablet, Take 20 mg  by mouth in the morning., Disp: , Rfl:    gabapentin (NEURONTIN) 100 MG capsule, Take 100 mg by mouth 2 (two) times daily., Disp: , Rfl:    ibuprofen (ADVIL) 800 MG tablet, Take 1 tablet (800 mg total) by mouth every 6 (six) hours as needed., Disp: 60 tablet, Rfl: 1   metFORMIN (GLUCOPHAGE-XR) 500 MG 24 hr tablet, Take 1,000 mg by mouth every evening., Disp: , Rfl:    omeprazole (PRILOSEC OTC) 20 MG tablet, Take 20 mg by mouth daily., Disp: , Rfl:    oxyCODONE-acetaminophen (PERCOCET) 5-325 MG tablet, Take 1 tablet by mouth every 4 (four) hours as needed for severe pain (pain score 7-10)., Disp: 30 tablet, Rfl: 0   rosuvastatin (CRESTOR) 5 MG tablet, Take 5 mg by mouth daily., Disp: , Rfl:    valsartan-hydrochlorothiazide (DIOVAN-HCT) 160-12.5 MG tablet, Take 1 tablet by mouth daily., Disp: , Rfl:   Social History   Tobacco Use  Smoking Status Every Day   Current packs/day: 0.75   Average packs/day: 0.8 packs/day for 54.1 years (40.5 ttl pk-yrs)   Types: Cigarettes, E-cigarettes   Start date: 1971  Smokeless Tobacco Never  Tobacco Comments   0.5 ppd of cigerettes , using e cigarette     Allergies  Allergen Reactions   Benadryl [  Diphenhydramine Hcl] Nausea And Vomiting and Other (See Comments)    Migraine headaches.   Lansoprazole Nausea And Vomiting and Other (See Comments)    Migraine headache  Prevacid* due to dye in med    Morphine Sulfate Nausea And Vomiting and Other (See Comments)    Migraine headaches.   Penicillins Nausea And Vomiting and Other (See Comments)    INTOLERANCE >  NAUSEA & VOMITING YEAST INFECTION > CHANGE IN NORMAL FLORA  Has patient had a PCN reaction causing immediate rash, facial/tongue/throat swelling, SOB or lightheadedness with hypotension:No Has patient had a PCN reaction causing severe rash involving mucus membranes or skin necrosis: No Has patient had a PCN reaction that required hospitalization No Has patient had a PCN reaction occurring within  the last 10 years:No    Red Dye #40 (Allura Red) Other (See Comments)    Also allergic to [facility] Dye-migraine headaches.   Simvastatin Nausea And Vomiting and Other (See Comments)    Migraine    Codeine Nausea And Vomiting   Shellfish Allergy Other (See Comments)    Congestion   Sulfa Antibiotics Nausea And Vomiting   Objective:  There were no vitals filed for this visit. There is no height or weight on file to calculate BMI. Constitutional Well developed. Well nourished.  Vascular Foot warm and well perfused. Capillary refill normal to all digits.   Neurologic Normal speech. Oriented to person, place, and time. Epicritic sensation to light touch grossly present bilaterally.  Dermatologic Skin healing well without signs of infection. Skin edges well coapted without signs of infection.  Orthopedic: Tenderness to palpation noted about the surgical site.   Radiographs: 3 views of skeletally mature left foot: Hardware is intact no signs of backing or loosening noted no other abnormalities identified Assessment:   1. Post-operative state    Plan:  Patient was evaluated and treated and all questions answered.  S/p foot surgery left -Progressing as expected post-operatively. -XR: See above -WB Status: Nonweightbearing in left lower extremity in walker -Sutures: Intact.  No clinical signs of Deis is noted no complication noted. -Medications: None -Foot redressed.  No follow-ups on file.

## 2023-09-07 ENCOUNTER — Ambulatory Visit (INDEPENDENT_AMBULATORY_CARE_PROVIDER_SITE_OTHER): Payer: Medicare Other | Admitting: Podiatry

## 2023-09-07 ENCOUNTER — Encounter: Payer: Self-pay | Admitting: Podiatry

## 2023-09-07 DIAGNOSIS — M19072 Primary osteoarthritis, left ankle and foot: Secondary | ICD-10-CM

## 2023-09-07 DIAGNOSIS — Z9889 Other specified postprocedural states: Secondary | ICD-10-CM

## 2023-09-07 NOTE — Progress Notes (Signed)
 Subjective:  Patient ID: [name], female    DOB: 02/16/1951,  MRN: [id number]  Chief Complaint  Patient presents with   Routine Post Op    RM#11 POV#2 Patient states doing well no pain medication being used . Minor swelling/redness no drainage coming from incision site.    DOS: 08/15/23 Procedure: Left first MPJ arthrodesis  73 y.o. female returns for post-op check.  She states she is doing well denies any fevers or some bruising.  Bandages clean dry and intact  No follow-ups on file.   Review of Systems: Negative except as noted in the HPI. Denies N/V/F/Ch.  Past Medical History:  Diagnosis Date   Alcohol abuse, in remission    Anxiety    Cancer of sigmoid colon (HCC) 07/2005   T2N0M0   Chronic bronchitis    COPD (chronic obstructive pulmonary disease) (HCC)    Depression    Diabetes mellitus without complication (HCC)    GERD (gastroesophageal reflux disease)    History of thyroid  nodule    monitoring   Hyperlipidemia    Hypertension    Migraines    OA (osteoarthritis)    PONV (postoperative nausea and vomiting)    Reflux     Current Outpatient Medications:    acetaminophen  (TYLENOL ) 500 MG tablet, Take 2 tablets (1,000 mg total) by mouth every 6 (six) hours as needed for mild pain or moderate pain., Disp: 60 tablet, Rfl: 0   amitriptyline  (ELAVIL ) 25 MG tablet, Take 100 mg by mouth at bedtime., Disp: , Rfl:    ARIPiprazole  (ABILIFY ) 20 MG tablet, Take 20 mg by mouth at bedtime., Disp: , Rfl:    calcium  carbonate (TUMS - DOSED IN MG ELEMENTAL CALCIUM ) 500 MG chewable tablet, Chew 2 tablets by mouth 4 (four) times daily as needed for indigestion or heartburn., Disp: , Rfl:    Cholecalciferol  (VITAMIN D ) 2000 units CAPS, Take 2,000 Units by mouth at bedtime., Disp: , Rfl:    doxycycline  (VIBRA -TABS) 100 MG tablet, Take 1 tablet (100 mg total) by mouth 2 (two) times daily., Disp: 20 tablet, Rfl: 0   famotidine  (PEPCID ) 20 MG tablet, Take 20 mg by mouth in the  morning., Disp: , Rfl:    gabapentin  (NEURONTIN ) 100 MG capsule, Take 100 mg by mouth 2 (two) times daily., Disp: , Rfl:    ibuprofen  (ADVIL ) 800 MG tablet, Take 1 tablet (800 mg total) by mouth every 6 (six) hours as needed., Disp: 60 tablet, Rfl: 1   metFORMIN  (GLUCOPHAGE -XR) 500 MG 24 hr tablet, Take 1,000 mg by mouth every evening., Disp: , Rfl:    omeprazole  (PRILOSEC  OTC) 20 MG tablet, Take 20 mg by mouth daily., Disp: , Rfl:    oxyCODONE -acetaminophen  (PERCOCET) 5-325 MG tablet, Take 1 tablet by mouth every 4 (four) hours as needed for severe pain (pain score 7-10)., Disp: 30 tablet, Rfl: 0   rosuvastatin  (CRESTOR ) 5 MG tablet, Take 5 mg by mouth daily., Disp: , Rfl:    valsartan-hydrochlorothiazide (DIOVAN-HCT) 160-12.5 MG tablet, Take 1 tablet by mouth daily., Disp: , Rfl:   Social History   Tobacco Use  Smoking Status Every Day   Current packs/day: 0.75   Average packs/day: 0.8 packs/day for 54.1 years (40.6 ttl pk-yrs)   Types: Cigarettes, E-cigarettes   Start date: 1971  Smokeless Tobacco Never  Tobacco Comments   0.5 ppd of cigerettes , using e cigarette     Allergies  Allergen Reactions   Benadryl  [Diphenhydramine  Hcl] Nausea And Vomiting and  Other (See Comments)    Migraine headaches.   Lansoprazole Nausea And Vomiting and Other (See Comments)    Migraine headache  Prevacid* due to dye in med    Morphine Sulfate Nausea And Vomiting and Other (See Comments)    Migraine headaches.   Penicillins Nausea And Vomiting and Other (See Comments)    INTOLERANCE >  NAUSEA & VOMITING YEAST INFECTION > CHANGE IN NORMAL FLORA  Has patient had a PCN reaction causing immediate rash, facial/tongue/throat swelling, SOB or lightheadedness with hypotension:No Has patient had a PCN reaction causing severe rash involving mucus membranes or skin necrosis: No Has patient had a PCN reaction that required hospitalization No Has patient had a PCN reaction occurring within the last 10  years:No    Red Dye #40 (Allura Red) Other (See Comments)    Also allergic to [facility] Dye-migraine headaches.   Simvastatin Nausea And Vomiting and Other (See Comments)    Migraine    Codeine Nausea And Vomiting   Shellfish Allergy Other (See Comments)    Congestion   Sulfa Antibiotics Nausea And Vomiting   Objective:  There were no vitals filed for this visit. There is no height or weight on file to calculate BMI. Constitutional Well developed. Well nourished.  Vascular Foot warm and well perfused. Capillary refill normal to all digits.   Neurologic Normal speech. Oriented to person, place, and time. Epicritic sensation to light touch grossly present bilaterally.  Dermatologic Skin completely epithelialized.  No signs of dehiscence noted no complication noted.  Good reduction of deformity noted.  Stiff first metatarsophalangeal joint noted  Orthopedic: Tenderness to palpation noted about the surgical site.   Radiographs: 3 views of skeletally mature left foot: Hardware is intact no signs of backing or loosening noted no other abnormalities identified Assessment:   No diagnosis found.  Plan:  Patient was evaluated and treated and all questions answered.  S/p foot surgery left -Progressing as expected post-operatively. -XR: See above -WB Status: Nonweightbearing in left lower extremity in walker -Sutures: Removed patient is healing well. -Medications: None -I will see her back in 4 weeks for final assessment patient states understanding  No follow-ups on file.

## 2023-09-28 ENCOUNTER — Ambulatory Visit (INDEPENDENT_AMBULATORY_CARE_PROVIDER_SITE_OTHER): Payer: Medicare Other | Admitting: Podiatry

## 2023-09-28 DIAGNOSIS — T148XXA Other injury of unspecified body region, initial encounter: Secondary | ICD-10-CM | POA: Diagnosis not present

## 2023-09-28 DIAGNOSIS — Z9889 Other specified postprocedural states: Secondary | ICD-10-CM

## 2023-09-28 MED ORDER — DOXYCYCLINE HYCLATE 100 MG PO TABS
100.0000 mg | ORAL_TABLET | Freq: Two times a day (BID) | ORAL | 0 refills | Status: DC
Start: 2023-09-28 — End: 2023-12-28

## 2023-09-28 NOTE — Progress Notes (Signed)
 Subjective:  Patient ID: [name], female    DOB: 1950-10-04,  MRN: [id number]  Chief Complaint  Patient presents with   Routine Post Op    DOS 08/15/2023 LT 1ST MPJ FUSION/ pt states having blisters come up on surgical foot close to surgical site    DOS: 08/15/23 Procedure: Left first MPJ arthrodesis  73 y.o. female returns for post-op check.  She states she is doing well denies any fevers or some bruising.  Bandages clean dry and intact  No follow-ups on file.   Review of Systems: Negative except as noted in the HPI. Denies N/V/F/Ch.  Past Medical History:  Diagnosis Date   Alcohol abuse, in remission    Anxiety    Cancer of sigmoid colon (HCC) 07/2005   T2N0M0   Chronic bronchitis    COPD (chronic obstructive pulmonary disease) (HCC)    Depression    Diabetes mellitus without complication (HCC)    GERD (gastroesophageal reflux disease)    History of thyroid nodule    monitoring   Hyperlipidemia    Hypertension    Migraines    OA (osteoarthritis)    PONV (postoperative nausea and vomiting)    Reflux     Current Outpatient Medications:    acetaminophen (TYLENOL) 500 MG tablet, Take 2 tablets (1,000 mg total) by mouth every 6 (six) hours as needed for mild pain or moderate pain., Disp: 60 tablet, Rfl: 0   amitriptyline (ELAVIL) 25 MG tablet, Take 100 mg by mouth at bedtime., Disp: , Rfl:    ARIPiprazole (ABILIFY) 20 MG tablet, Take 20 mg by mouth at bedtime., Disp: , Rfl:    calcium carbonate (TUMS - DOSED IN MG ELEMENTAL CALCIUM) 500 MG chewable tablet, Chew 2 tablets by mouth 4 (four) times daily as needed for indigestion or heartburn., Disp: , Rfl:    Cholecalciferol (VITAMIN D) 2000 units CAPS, Take 2,000 Units by mouth at bedtime., Disp: , Rfl:    doxycycline (VIBRA-TABS) 100 MG tablet, Take 1 tablet (100 mg total) by mouth 2 (two) times daily., Disp: 20 tablet, Rfl: 0   famotidine (PEPCID) 20 MG tablet, Take 20 mg by mouth in the morning., Disp: , Rfl:     gabapentin (NEURONTIN) 100 MG capsule, Take 100 mg by mouth 2 (two) times daily., Disp: , Rfl:    ibuprofen (ADVIL) 800 MG tablet, Take 1 tablet (800 mg total) by mouth every 6 (six) hours as needed., Disp: 60 tablet, Rfl: 1   metFORMIN (GLUCOPHAGE-XR) 500 MG 24 hr tablet, Take 1,000 mg by mouth every evening., Disp: , Rfl:    omeprazole (PRILOSEC OTC) 20 MG tablet, Take 20 mg by mouth daily., Disp: , Rfl:    oxyCODONE-acetaminophen (PERCOCET) 5-325 MG tablet, Take 1 tablet by mouth every 4 (four) hours as needed for severe pain (pain score 7-10)., Disp: 30 tablet, Rfl: 0   rosuvastatin (CRESTOR) 5 MG tablet, Take 5 mg by mouth daily., Disp: , Rfl:    valsartan-hydrochlorothiazide (DIOVAN-HCT) 160-12.5 MG tablet, Take 1 tablet by mouth daily., Disp: , Rfl:   Social History   Tobacco Use  Smoking Status Every Day   Current packs/day: 0.75   Average packs/day: 0.8 packs/day for 54.2 years (40.6 ttl pk-yrs)   Types: Cigarettes, E-cigarettes   Start date: 1971  Smokeless Tobacco Never  Tobacco Comments   0.5 ppd of cigerettes , using e cigarette     Allergies  Allergen Reactions   Benadryl [Diphenhydramine Hcl] Nausea And Vomiting and Other (  See Comments)    Migraine headaches.   Lansoprazole Nausea And Vomiting and Other (See Comments)    Migraine headache  Prevacid* due to dye in med    Morphine Sulfate Nausea And Vomiting and Other (See Comments)    Migraine headaches.   Penicillins Nausea And Vomiting and Other (See Comments)    INTOLERANCE >  NAUSEA & VOMITING YEAST INFECTION > CHANGE IN NORMAL FLORA  Has patient had a PCN reaction causing immediate rash, facial/tongue/throat swelling, SOB or lightheadedness with hypotension:No Has patient had a PCN reaction causing severe rash involving mucus membranes or skin necrosis: No Has patient had a PCN reaction that required hospitalization No Has patient had a PCN reaction occurring within the last 10 years:No    Red Dye #40  (Allura Red) Other (See Comments)    Also allergic to [facility] Dye-migraine headaches.   Simvastatin Nausea And Vomiting and Other (See Comments)    Migraine    Codeine Nausea And Vomiting   Shellfish Allergy Other (See Comments)    Congestion   Sulfa Antibiotics Nausea And Vomiting   Objective:  There were no vitals filed for this visit. There is no height or weight on file to calculate BMI. Constitutional Well developed. Well nourished.  Vascular Foot warm and well perfused. Capillary refill normal to all digits.   Neurologic Normal speech. Oriented to person, place, and time. Epicritic sensation to light touch grossly present bilaterally.  Dermatologic Skin completely epithelialized.  No signs of dehiscence noted no complication noted.  Good reduction of deformity noted.  Stiff first metatarsophalangeal joint noted  Friction blister noted healing well serous drainage  Orthopedic: Tenderness to palpation noted about the surgical site.   Radiographs: 3 views of skeletally mature left foot: Hardware is intact no signs of backing or loosening noted no other abnormalities identified Assessment:   No diagnosis found.  Plan:  Patient was evaluated and treated and all questions answered.  Left friction blister -All the questions and concerns were discussed with the patient in extensive detail given the nature of the friction blister the patient will benefit from doxycycline doxycycline was sent to the pharmacy.  Does not appear to be deep healing well.  Might be due to friction.  Encouraged her to wear surgical shoe or surgical shoe dispensed  S/p foot surgery left -Progressing as expected post-operatively. -XR: See above -WB Status: Nonweightbearing in left lower extremity in walker -Sutures: Removed patient is healing well. -Medications: None -I will see her back in 4 weeks for final assessment patient states understanding  No follow-ups on file.

## 2023-10-07 ENCOUNTER — Ambulatory Visit: Payer: Medicare Other | Admitting: Podiatry

## 2023-10-07 DIAGNOSIS — T148XXA Other injury of unspecified body region, initial encounter: Secondary | ICD-10-CM | POA: Diagnosis not present

## 2023-10-07 NOTE — Progress Notes (Signed)
 Subjective:  Patient ID: [name], female    DOB: 1951/01/09,  MRN: [id number]  Chief Complaint  Patient presents with   Routine Post Op    DOS: 08/15/23 Procedure: Left first MPJ arthrodesis  73 y.o. female returns for post-op check.  She states she is doing well denies any fevers or some bruising.  Bandages clean dry and intact  No follow-ups on file.   Review of Systems: Negative except as noted in the HPI. Denies N/V/F/Ch.  Past Medical History:  Diagnosis Date   Alcohol abuse, in remission    Anxiety    Cancer of sigmoid colon (HCC) 07/2005   T2N0M0   Chronic bronchitis    COPD (chronic obstructive pulmonary disease) (HCC)    Depression    Diabetes mellitus without complication (HCC)    GERD (gastroesophageal reflux disease)    History of thyroid nodule    monitoring   Hyperlipidemia    Hypertension    Migraines    OA (osteoarthritis)    PONV (postoperative nausea and vomiting)    Reflux     Current Outpatient Medications:    acetaminophen (TYLENOL) 500 MG tablet, Take 2 tablets (1,000 mg total) by mouth every 6 (six) hours as needed for mild pain or moderate pain., Disp: 60 tablet, Rfl: 0   amitriptyline (ELAVIL) 25 MG tablet, Take 100 mg by mouth at bedtime., Disp: , Rfl:    ARIPiprazole (ABILIFY) 20 MG tablet, Take 20 mg by mouth at bedtime., Disp: , Rfl:    calcium carbonate (TUMS - DOSED IN MG ELEMENTAL CALCIUM) 500 MG chewable tablet, Chew 2 tablets by mouth 4 (four) times daily as needed for indigestion or heartburn., Disp: , Rfl:    Cholecalciferol (VITAMIN D) 2000 units CAPS, Take 2,000 Units by mouth at bedtime., Disp: , Rfl:    doxycycline (VIBRA-TABS) 100 MG tablet, Take 1 tablet (100 mg total) by mouth 2 (two) times daily., Disp: 20 tablet, Rfl: 0   doxycycline (VIBRA-TABS) 100 MG tablet, Take 1 tablet (100 mg total) by mouth 2 (two) times daily., Disp: 20 tablet, Rfl: 0   famotidine (PEPCID) 20 MG tablet, Take 20 mg by mouth in the morning.,  Disp: , Rfl:    gabapentin (NEURONTIN) 100 MG capsule, Take 100 mg by mouth 2 (two) times daily., Disp: , Rfl:    ibuprofen (ADVIL) 800 MG tablet, Take 1 tablet (800 mg total) by mouth every 6 (six) hours as needed., Disp: 60 tablet, Rfl: 1   metFORMIN (GLUCOPHAGE-XR) 500 MG 24 hr tablet, Take 1,000 mg by mouth every evening., Disp: , Rfl:    omeprazole (PRILOSEC OTC) 20 MG tablet, Take 20 mg by mouth daily., Disp: , Rfl:    oxyCODONE-acetaminophen (PERCOCET) 5-325 MG tablet, Take 1 tablet by mouth every 4 (four) hours as needed for severe pain (pain score 7-10)., Disp: 30 tablet, Rfl: 0   rosuvastatin (CRESTOR) 5 MG tablet, Take 5 mg by mouth daily., Disp: , Rfl:    valsartan-hydrochlorothiazide (DIOVAN-HCT) 160-12.5 MG tablet, Take 1 tablet by mouth daily., Disp: , Rfl:   Social History   Tobacco Use  Smoking Status Every Day   Current packs/day: 0.75   Average packs/day: 0.8 packs/day for 54.2 years (40.6 ttl pk-yrs)   Types: Cigarettes, E-cigarettes   Start date: 1971  Smokeless Tobacco Never  Tobacco Comments   0.5 ppd of cigerettes , using e cigarette     Allergies  Allergen Reactions   Benadryl [Diphenhydramine Hcl] Nausea And Vomiting  and Other (See Comments)    Migraine headaches.   Lansoprazole Nausea And Vomiting and Other (See Comments)    Migraine headache  Prevacid* due to dye in med    Morphine Sulfate Nausea And Vomiting and Other (See Comments)    Migraine headaches.   Penicillins Nausea And Vomiting and Other (See Comments)    INTOLERANCE >  NAUSEA & VOMITING YEAST INFECTION > CHANGE IN NORMAL FLORA  Has patient had a PCN reaction causing immediate rash, facial/tongue/throat swelling, SOB or lightheadedness with hypotension:No Has patient had a PCN reaction causing severe rash involving mucus membranes or skin necrosis: No Has patient had a PCN reaction that required hospitalization No Has patient had a PCN reaction occurring within the last 10 years:No     Red Dye #40 (Allura Red) Other (See Comments)    Also allergic to [facility] Dye-migraine headaches.   Simvastatin Nausea And Vomiting and Other (See Comments)    Migraine    Codeine Nausea And Vomiting   Shellfish Allergy Other (See Comments)    Congestion   Sulfa Antibiotics Nausea And Vomiting   Objective:  There were no vitals filed for this visit. There is no height or weight on file to calculate BMI. Constitutional Well developed. Well nourished.  Vascular Foot warm and well perfused. Capillary refill normal to all digits.   Neurologic Normal speech. Oriented to person, place, and time. Epicritic sensation to light touch grossly present bilaterally.  Dermatologic Skin completely epithelialized.  No signs of dehiscence noted no complication noted.  Good reduction of deformity noted.  Stiff first metatarsophalangeal joint noted  Friction blister noted healing well serous drainage  Orthopedic: Tenderness to palpation noted about the surgical site.   Radiographs: 3 views of skeletally mature left foot: Hardware is intact no signs of backing or loosening noted no other abnormalities identified Assessment:   No diagnosis found.  Plan:  Patient was evaluated and treated and all questions answered.  Left friction blister~improving -All the questions and concerns were discussed with the patient in extensive no redness noted therefore holding off on doxycycline.  Continue keeping it protected at this time to have resolved mostly  S/p foot surgery left -Clinically healed and officially discharged from my care if any foot and ankle issues on future she will come back and see me.  No follow-ups on file.

## 2023-10-12 DIAGNOSIS — S90425D Blister (nonthermal), left lesser toe(s), subsequent encounter: Secondary | ICD-10-CM | POA: Diagnosis not present

## 2023-10-12 DIAGNOSIS — S91309A Unspecified open wound, unspecified foot, initial encounter: Secondary | ICD-10-CM | POA: Diagnosis not present

## 2023-10-12 DIAGNOSIS — E1121 Type 2 diabetes mellitus with diabetic nephropathy: Secondary | ICD-10-CM | POA: Diagnosis not present

## 2023-10-15 DIAGNOSIS — S90822D Blister (nonthermal), left foot, subsequent encounter: Secondary | ICD-10-CM | POA: Diagnosis not present

## 2023-10-15 DIAGNOSIS — E1169 Type 2 diabetes mellitus with other specified complication: Secondary | ICD-10-CM | POA: Diagnosis not present

## 2023-10-15 DIAGNOSIS — E782 Mixed hyperlipidemia: Secondary | ICD-10-CM | POA: Diagnosis not present

## 2023-10-19 DIAGNOSIS — E1121 Type 2 diabetes mellitus with diabetic nephropathy: Secondary | ICD-10-CM | POA: Diagnosis not present

## 2023-10-19 DIAGNOSIS — S90822D Blister (nonthermal), left foot, subsequent encounter: Secondary | ICD-10-CM | POA: Diagnosis not present

## 2023-10-24 DIAGNOSIS — I2510 Atherosclerotic heart disease of native coronary artery without angina pectoris: Secondary | ICD-10-CM | POA: Diagnosis not present

## 2023-10-24 DIAGNOSIS — Z9181 History of falling: Secondary | ICD-10-CM | POA: Diagnosis not present

## 2023-10-24 DIAGNOSIS — M4807 Spinal stenosis, lumbosacral region: Secondary | ICD-10-CM | POA: Diagnosis not present

## 2023-10-24 DIAGNOSIS — N182 Chronic kidney disease, stage 2 (mild): Secondary | ICD-10-CM | POA: Diagnosis not present

## 2023-10-24 DIAGNOSIS — S90822D Blister (nonthermal), left foot, subsequent encounter: Secondary | ICD-10-CM | POA: Diagnosis not present

## 2023-10-24 DIAGNOSIS — I700 Atherosclerosis of aorta: Secondary | ICD-10-CM | POA: Diagnosis not present

## 2023-10-24 DIAGNOSIS — J449 Chronic obstructive pulmonary disease, unspecified: Secondary | ICD-10-CM | POA: Diagnosis not present

## 2023-10-24 DIAGNOSIS — G720 Drug-induced myopathy: Secondary | ICD-10-CM | POA: Diagnosis not present

## 2023-10-24 DIAGNOSIS — M1712 Unilateral primary osteoarthritis, left knee: Secondary | ICD-10-CM | POA: Diagnosis not present

## 2023-10-24 DIAGNOSIS — M797 Fibromyalgia: Secondary | ICD-10-CM | POA: Diagnosis not present

## 2023-10-24 DIAGNOSIS — Z85038 Personal history of other malignant neoplasm of large intestine: Secondary | ICD-10-CM | POA: Diagnosis not present

## 2023-10-24 DIAGNOSIS — E039 Hypothyroidism, unspecified: Secondary | ICD-10-CM | POA: Diagnosis not present

## 2023-10-24 DIAGNOSIS — E782 Mixed hyperlipidemia: Secondary | ICD-10-CM | POA: Diagnosis not present

## 2023-10-24 DIAGNOSIS — E042 Nontoxic multinodular goiter: Secondary | ICD-10-CM | POA: Diagnosis not present

## 2023-10-24 DIAGNOSIS — F331 Major depressive disorder, recurrent, moderate: Secondary | ICD-10-CM | POA: Diagnosis not present

## 2023-10-24 DIAGNOSIS — E1122 Type 2 diabetes mellitus with diabetic chronic kidney disease: Secondary | ICD-10-CM | POA: Diagnosis not present

## 2023-10-24 DIAGNOSIS — E559 Vitamin D deficiency, unspecified: Secondary | ICD-10-CM | POA: Diagnosis not present

## 2023-10-24 DIAGNOSIS — I129 Hypertensive chronic kidney disease with stage 1 through stage 4 chronic kidney disease, or unspecified chronic kidney disease: Secondary | ICD-10-CM | POA: Diagnosis not present

## 2023-10-24 DIAGNOSIS — K219 Gastro-esophageal reflux disease without esophagitis: Secondary | ICD-10-CM | POA: Diagnosis not present

## 2023-10-24 DIAGNOSIS — F17210 Nicotine dependence, cigarettes, uncomplicated: Secondary | ICD-10-CM | POA: Diagnosis not present

## 2023-10-24 DIAGNOSIS — K5901 Slow transit constipation: Secondary | ICD-10-CM | POA: Diagnosis not present

## 2023-10-24 DIAGNOSIS — F410 Panic disorder [episodic paroxysmal anxiety] without agoraphobia: Secondary | ICD-10-CM | POA: Diagnosis not present

## 2023-10-24 DIAGNOSIS — G43909 Migraine, unspecified, not intractable, without status migrainosus: Secondary | ICD-10-CM | POA: Diagnosis not present

## 2023-10-24 DIAGNOSIS — Z4789 Encounter for other orthopedic aftercare: Secondary | ICD-10-CM | POA: Diagnosis not present

## 2023-10-26 ENCOUNTER — Other Ambulatory Visit: Payer: Self-pay | Admitting: Acute Care

## 2023-10-26 DIAGNOSIS — Z87891 Personal history of nicotine dependence: Secondary | ICD-10-CM

## 2023-10-26 DIAGNOSIS — F17210 Nicotine dependence, cigarettes, uncomplicated: Secondary | ICD-10-CM

## 2023-10-27 DIAGNOSIS — F410 Panic disorder [episodic paroxysmal anxiety] without agoraphobia: Secondary | ICD-10-CM | POA: Diagnosis not present

## 2023-10-27 DIAGNOSIS — J449 Chronic obstructive pulmonary disease, unspecified: Secondary | ICD-10-CM | POA: Diagnosis not present

## 2023-10-27 DIAGNOSIS — G43909 Migraine, unspecified, not intractable, without status migrainosus: Secondary | ICD-10-CM | POA: Diagnosis not present

## 2023-10-27 DIAGNOSIS — F17210 Nicotine dependence, cigarettes, uncomplicated: Secondary | ICD-10-CM | POA: Diagnosis not present

## 2023-10-27 DIAGNOSIS — E042 Nontoxic multinodular goiter: Secondary | ICD-10-CM | POA: Diagnosis not present

## 2023-10-27 DIAGNOSIS — M4807 Spinal stenosis, lumbosacral region: Secondary | ICD-10-CM | POA: Diagnosis not present

## 2023-10-27 DIAGNOSIS — K219 Gastro-esophageal reflux disease without esophagitis: Secondary | ICD-10-CM | POA: Diagnosis not present

## 2023-10-27 DIAGNOSIS — Z9181 History of falling: Secondary | ICD-10-CM | POA: Diagnosis not present

## 2023-10-27 DIAGNOSIS — Z85038 Personal history of other malignant neoplasm of large intestine: Secondary | ICD-10-CM | POA: Diagnosis not present

## 2023-10-27 DIAGNOSIS — M1712 Unilateral primary osteoarthritis, left knee: Secondary | ICD-10-CM | POA: Diagnosis not present

## 2023-10-27 DIAGNOSIS — F331 Major depressive disorder, recurrent, moderate: Secondary | ICD-10-CM | POA: Diagnosis not present

## 2023-10-27 DIAGNOSIS — E039 Hypothyroidism, unspecified: Secondary | ICD-10-CM | POA: Diagnosis not present

## 2023-10-27 DIAGNOSIS — E559 Vitamin D deficiency, unspecified: Secondary | ICD-10-CM | POA: Diagnosis not present

## 2023-10-27 DIAGNOSIS — N182 Chronic kidney disease, stage 2 (mild): Secondary | ICD-10-CM | POA: Diagnosis not present

## 2023-10-27 DIAGNOSIS — I700 Atherosclerosis of aorta: Secondary | ICD-10-CM | POA: Diagnosis not present

## 2023-10-27 DIAGNOSIS — M797 Fibromyalgia: Secondary | ICD-10-CM | POA: Diagnosis not present

## 2023-10-27 DIAGNOSIS — I129 Hypertensive chronic kidney disease with stage 1 through stage 4 chronic kidney disease, or unspecified chronic kidney disease: Secondary | ICD-10-CM | POA: Diagnosis not present

## 2023-10-27 DIAGNOSIS — E1122 Type 2 diabetes mellitus with diabetic chronic kidney disease: Secondary | ICD-10-CM | POA: Diagnosis not present

## 2023-10-27 DIAGNOSIS — K5901 Slow transit constipation: Secondary | ICD-10-CM | POA: Diagnosis not present

## 2023-10-27 DIAGNOSIS — Z4789 Encounter for other orthopedic aftercare: Secondary | ICD-10-CM | POA: Diagnosis not present

## 2023-10-27 DIAGNOSIS — E782 Mixed hyperlipidemia: Secondary | ICD-10-CM | POA: Diagnosis not present

## 2023-10-27 DIAGNOSIS — G720 Drug-induced myopathy: Secondary | ICD-10-CM | POA: Diagnosis not present

## 2023-10-27 DIAGNOSIS — S90822D Blister (nonthermal), left foot, subsequent encounter: Secondary | ICD-10-CM | POA: Diagnosis not present

## 2023-10-27 DIAGNOSIS — I2510 Atherosclerotic heart disease of native coronary artery without angina pectoris: Secondary | ICD-10-CM | POA: Diagnosis not present

## 2023-10-28 ENCOUNTER — Ambulatory Visit: Admitting: Podiatry

## 2023-10-31 DIAGNOSIS — F17210 Nicotine dependence, cigarettes, uncomplicated: Secondary | ICD-10-CM | POA: Diagnosis not present

## 2023-10-31 DIAGNOSIS — G720 Drug-induced myopathy: Secondary | ICD-10-CM | POA: Diagnosis not present

## 2023-10-31 DIAGNOSIS — E559 Vitamin D deficiency, unspecified: Secondary | ICD-10-CM | POA: Diagnosis not present

## 2023-10-31 DIAGNOSIS — M1712 Unilateral primary osteoarthritis, left knee: Secondary | ICD-10-CM | POA: Diagnosis not present

## 2023-10-31 DIAGNOSIS — I2510 Atherosclerotic heart disease of native coronary artery without angina pectoris: Secondary | ICD-10-CM | POA: Diagnosis not present

## 2023-10-31 DIAGNOSIS — M797 Fibromyalgia: Secondary | ICD-10-CM | POA: Diagnosis not present

## 2023-10-31 DIAGNOSIS — E039 Hypothyroidism, unspecified: Secondary | ICD-10-CM | POA: Diagnosis not present

## 2023-10-31 DIAGNOSIS — F331 Major depressive disorder, recurrent, moderate: Secondary | ICD-10-CM | POA: Diagnosis not present

## 2023-10-31 DIAGNOSIS — N182 Chronic kidney disease, stage 2 (mild): Secondary | ICD-10-CM | POA: Diagnosis not present

## 2023-10-31 DIAGNOSIS — I129 Hypertensive chronic kidney disease with stage 1 through stage 4 chronic kidney disease, or unspecified chronic kidney disease: Secondary | ICD-10-CM | POA: Diagnosis not present

## 2023-10-31 DIAGNOSIS — S90822D Blister (nonthermal), left foot, subsequent encounter: Secondary | ICD-10-CM | POA: Diagnosis not present

## 2023-10-31 DIAGNOSIS — M4807 Spinal stenosis, lumbosacral region: Secondary | ICD-10-CM | POA: Diagnosis not present

## 2023-10-31 DIAGNOSIS — J449 Chronic obstructive pulmonary disease, unspecified: Secondary | ICD-10-CM | POA: Diagnosis not present

## 2023-10-31 DIAGNOSIS — E042 Nontoxic multinodular goiter: Secondary | ICD-10-CM | POA: Diagnosis not present

## 2023-10-31 DIAGNOSIS — F410 Panic disorder [episodic paroxysmal anxiety] without agoraphobia: Secondary | ICD-10-CM | POA: Diagnosis not present

## 2023-10-31 DIAGNOSIS — K219 Gastro-esophageal reflux disease without esophagitis: Secondary | ICD-10-CM | POA: Diagnosis not present

## 2023-10-31 DIAGNOSIS — G43909 Migraine, unspecified, not intractable, without status migrainosus: Secondary | ICD-10-CM | POA: Diagnosis not present

## 2023-10-31 DIAGNOSIS — Z9181 History of falling: Secondary | ICD-10-CM | POA: Diagnosis not present

## 2023-10-31 DIAGNOSIS — K5901 Slow transit constipation: Secondary | ICD-10-CM | POA: Diagnosis not present

## 2023-10-31 DIAGNOSIS — Z85038 Personal history of other malignant neoplasm of large intestine: Secondary | ICD-10-CM | POA: Diagnosis not present

## 2023-10-31 DIAGNOSIS — Z4789 Encounter for other orthopedic aftercare: Secondary | ICD-10-CM | POA: Diagnosis not present

## 2023-10-31 DIAGNOSIS — E1122 Type 2 diabetes mellitus with diabetic chronic kidney disease: Secondary | ICD-10-CM | POA: Diagnosis not present

## 2023-10-31 DIAGNOSIS — I700 Atherosclerosis of aorta: Secondary | ICD-10-CM | POA: Diagnosis not present

## 2023-10-31 DIAGNOSIS — E782 Mixed hyperlipidemia: Secondary | ICD-10-CM | POA: Diagnosis not present

## 2023-11-03 DIAGNOSIS — I700 Atherosclerosis of aorta: Secondary | ICD-10-CM | POA: Diagnosis not present

## 2023-11-03 DIAGNOSIS — G720 Drug-induced myopathy: Secondary | ICD-10-CM | POA: Diagnosis not present

## 2023-11-03 DIAGNOSIS — I2510 Atherosclerotic heart disease of native coronary artery without angina pectoris: Secondary | ICD-10-CM | POA: Diagnosis not present

## 2023-11-03 DIAGNOSIS — S90822D Blister (nonthermal), left foot, subsequent encounter: Secondary | ICD-10-CM | POA: Diagnosis not present

## 2023-11-03 DIAGNOSIS — E042 Nontoxic multinodular goiter: Secondary | ICD-10-CM | POA: Diagnosis not present

## 2023-11-03 DIAGNOSIS — M4807 Spinal stenosis, lumbosacral region: Secondary | ICD-10-CM | POA: Diagnosis not present

## 2023-11-03 DIAGNOSIS — K5901 Slow transit constipation: Secondary | ICD-10-CM | POA: Diagnosis not present

## 2023-11-03 DIAGNOSIS — F17210 Nicotine dependence, cigarettes, uncomplicated: Secondary | ICD-10-CM | POA: Diagnosis not present

## 2023-11-03 DIAGNOSIS — F410 Panic disorder [episodic paroxysmal anxiety] without agoraphobia: Secondary | ICD-10-CM | POA: Diagnosis not present

## 2023-11-03 DIAGNOSIS — N182 Chronic kidney disease, stage 2 (mild): Secondary | ICD-10-CM | POA: Diagnosis not present

## 2023-11-03 DIAGNOSIS — E782 Mixed hyperlipidemia: Secondary | ICD-10-CM | POA: Diagnosis not present

## 2023-11-03 DIAGNOSIS — Z9181 History of falling: Secondary | ICD-10-CM | POA: Diagnosis not present

## 2023-11-03 DIAGNOSIS — Z85038 Personal history of other malignant neoplasm of large intestine: Secondary | ICD-10-CM | POA: Diagnosis not present

## 2023-11-03 DIAGNOSIS — Z4789 Encounter for other orthopedic aftercare: Secondary | ICD-10-CM | POA: Diagnosis not present

## 2023-11-03 DIAGNOSIS — K219 Gastro-esophageal reflux disease without esophagitis: Secondary | ICD-10-CM | POA: Diagnosis not present

## 2023-11-03 DIAGNOSIS — M1712 Unilateral primary osteoarthritis, left knee: Secondary | ICD-10-CM | POA: Diagnosis not present

## 2023-11-03 DIAGNOSIS — F331 Major depressive disorder, recurrent, moderate: Secondary | ICD-10-CM | POA: Diagnosis not present

## 2023-11-03 DIAGNOSIS — J449 Chronic obstructive pulmonary disease, unspecified: Secondary | ICD-10-CM | POA: Diagnosis not present

## 2023-11-03 DIAGNOSIS — M797 Fibromyalgia: Secondary | ICD-10-CM | POA: Diagnosis not present

## 2023-11-03 DIAGNOSIS — E559 Vitamin D deficiency, unspecified: Secondary | ICD-10-CM | POA: Diagnosis not present

## 2023-11-03 DIAGNOSIS — G43909 Migraine, unspecified, not intractable, without status migrainosus: Secondary | ICD-10-CM | POA: Diagnosis not present

## 2023-11-03 DIAGNOSIS — E1122 Type 2 diabetes mellitus with diabetic chronic kidney disease: Secondary | ICD-10-CM | POA: Diagnosis not present

## 2023-11-03 DIAGNOSIS — I129 Hypertensive chronic kidney disease with stage 1 through stage 4 chronic kidney disease, or unspecified chronic kidney disease: Secondary | ICD-10-CM | POA: Diagnosis not present

## 2023-11-03 DIAGNOSIS — E039 Hypothyroidism, unspecified: Secondary | ICD-10-CM | POA: Diagnosis not present

## 2023-11-04 ENCOUNTER — Ambulatory Visit
Admission: RE | Admit: 2023-11-04 | Discharge: 2023-11-04 | Disposition: A | Discharge status: Home / Self Care | Source: Ambulatory Visit | Attending: Acute Care | Admitting: Acute Care

## 2023-11-04 DIAGNOSIS — F17210 Nicotine dependence, cigarettes, uncomplicated: Secondary | ICD-10-CM | POA: Diagnosis not present

## 2023-11-04 DIAGNOSIS — Z122 Encounter for screening for malignant neoplasm of respiratory organs: Secondary | ICD-10-CM | POA: Diagnosis not present

## 2023-11-04 DIAGNOSIS — Z87891 Personal history of nicotine dependence: Secondary | ICD-10-CM

## 2023-11-08 DIAGNOSIS — I10 Essential (primary) hypertension: Secondary | ICD-10-CM | POA: Diagnosis not present

## 2023-11-08 DIAGNOSIS — E1169 Type 2 diabetes mellitus with other specified complication: Secondary | ICD-10-CM | POA: Diagnosis not present

## 2023-11-08 DIAGNOSIS — S90822D Blister (nonthermal), left foot, subsequent encounter: Secondary | ICD-10-CM | POA: Diagnosis not present

## 2023-11-08 DIAGNOSIS — E782 Mixed hyperlipidemia: Secondary | ICD-10-CM | POA: Diagnosis not present

## 2023-11-09 DIAGNOSIS — K219 Gastro-esophageal reflux disease without esophagitis: Secondary | ICD-10-CM | POA: Diagnosis not present

## 2023-11-09 DIAGNOSIS — M4807 Spinal stenosis, lumbosacral region: Secondary | ICD-10-CM | POA: Diagnosis not present

## 2023-11-09 DIAGNOSIS — I2510 Atherosclerotic heart disease of native coronary artery without angina pectoris: Secondary | ICD-10-CM | POA: Diagnosis not present

## 2023-11-09 DIAGNOSIS — Z85038 Personal history of other malignant neoplasm of large intestine: Secondary | ICD-10-CM | POA: Diagnosis not present

## 2023-11-09 DIAGNOSIS — F17210 Nicotine dependence, cigarettes, uncomplicated: Secondary | ICD-10-CM | POA: Diagnosis not present

## 2023-11-09 DIAGNOSIS — E782 Mixed hyperlipidemia: Secondary | ICD-10-CM | POA: Diagnosis not present

## 2023-11-09 DIAGNOSIS — M1712 Unilateral primary osteoarthritis, left knee: Secondary | ICD-10-CM | POA: Diagnosis not present

## 2023-11-09 DIAGNOSIS — E039 Hypothyroidism, unspecified: Secondary | ICD-10-CM | POA: Diagnosis not present

## 2023-11-09 DIAGNOSIS — K5901 Slow transit constipation: Secondary | ICD-10-CM | POA: Diagnosis not present

## 2023-11-09 DIAGNOSIS — G43909 Migraine, unspecified, not intractable, without status migrainosus: Secondary | ICD-10-CM | POA: Diagnosis not present

## 2023-11-09 DIAGNOSIS — N182 Chronic kidney disease, stage 2 (mild): Secondary | ICD-10-CM | POA: Diagnosis not present

## 2023-11-09 DIAGNOSIS — E559 Vitamin D deficiency, unspecified: Secondary | ICD-10-CM | POA: Diagnosis not present

## 2023-11-09 DIAGNOSIS — Z4789 Encounter for other orthopedic aftercare: Secondary | ICD-10-CM | POA: Diagnosis not present

## 2023-11-09 DIAGNOSIS — F331 Major depressive disorder, recurrent, moderate: Secondary | ICD-10-CM | POA: Diagnosis not present

## 2023-11-09 DIAGNOSIS — I129 Hypertensive chronic kidney disease with stage 1 through stage 4 chronic kidney disease, or unspecified chronic kidney disease: Secondary | ICD-10-CM | POA: Diagnosis not present

## 2023-11-09 DIAGNOSIS — J449 Chronic obstructive pulmonary disease, unspecified: Secondary | ICD-10-CM | POA: Diagnosis not present

## 2023-11-09 DIAGNOSIS — E042 Nontoxic multinodular goiter: Secondary | ICD-10-CM | POA: Diagnosis not present

## 2023-11-09 DIAGNOSIS — E1122 Type 2 diabetes mellitus with diabetic chronic kidney disease: Secondary | ICD-10-CM | POA: Diagnosis not present

## 2023-11-09 DIAGNOSIS — S90822D Blister (nonthermal), left foot, subsequent encounter: Secondary | ICD-10-CM | POA: Diagnosis not present

## 2023-11-09 DIAGNOSIS — Z9181 History of falling: Secondary | ICD-10-CM | POA: Diagnosis not present

## 2023-11-09 DIAGNOSIS — F410 Panic disorder [episodic paroxysmal anxiety] without agoraphobia: Secondary | ICD-10-CM | POA: Diagnosis not present

## 2023-11-09 DIAGNOSIS — I700 Atherosclerosis of aorta: Secondary | ICD-10-CM | POA: Diagnosis not present

## 2023-11-09 DIAGNOSIS — G720 Drug-induced myopathy: Secondary | ICD-10-CM | POA: Diagnosis not present

## 2023-11-09 DIAGNOSIS — M797 Fibromyalgia: Secondary | ICD-10-CM | POA: Diagnosis not present

## 2023-11-14 ENCOUNTER — Encounter (HOSPITAL_BASED_OUTPATIENT_CLINIC_OR_DEPARTMENT_OTHER): Attending: General Surgery | Admitting: General Surgery

## 2023-11-14 DIAGNOSIS — L97529 Non-pressure chronic ulcer of other part of left foot with unspecified severity: Secondary | ICD-10-CM | POA: Insufficient documentation

## 2023-11-14 DIAGNOSIS — S91312A Laceration without foreign body, left foot, initial encounter: Secondary | ICD-10-CM | POA: Diagnosis not present

## 2023-11-14 DIAGNOSIS — E11621 Type 2 diabetes mellitus with foot ulcer: Secondary | ICD-10-CM | POA: Insufficient documentation

## 2023-11-14 DIAGNOSIS — L97522 Non-pressure chronic ulcer of other part of left foot with fat layer exposed: Secondary | ICD-10-CM | POA: Diagnosis not present

## 2023-11-16 DIAGNOSIS — Z85038 Personal history of other malignant neoplasm of large intestine: Secondary | ICD-10-CM | POA: Diagnosis not present

## 2023-11-16 DIAGNOSIS — K5901 Slow transit constipation: Secondary | ICD-10-CM | POA: Diagnosis not present

## 2023-11-16 DIAGNOSIS — F331 Major depressive disorder, recurrent, moderate: Secondary | ICD-10-CM | POA: Diagnosis not present

## 2023-11-16 DIAGNOSIS — E039 Hypothyroidism, unspecified: Secondary | ICD-10-CM | POA: Diagnosis not present

## 2023-11-16 DIAGNOSIS — I129 Hypertensive chronic kidney disease with stage 1 through stage 4 chronic kidney disease, or unspecified chronic kidney disease: Secondary | ICD-10-CM | POA: Diagnosis not present

## 2023-11-16 DIAGNOSIS — M797 Fibromyalgia: Secondary | ICD-10-CM | POA: Diagnosis not present

## 2023-11-16 DIAGNOSIS — E1122 Type 2 diabetes mellitus with diabetic chronic kidney disease: Secondary | ICD-10-CM | POA: Diagnosis not present

## 2023-11-16 DIAGNOSIS — F17210 Nicotine dependence, cigarettes, uncomplicated: Secondary | ICD-10-CM | POA: Diagnosis not present

## 2023-11-16 DIAGNOSIS — E042 Nontoxic multinodular goiter: Secondary | ICD-10-CM | POA: Diagnosis not present

## 2023-11-16 DIAGNOSIS — E559 Vitamin D deficiency, unspecified: Secondary | ICD-10-CM | POA: Diagnosis not present

## 2023-11-16 DIAGNOSIS — M1712 Unilateral primary osteoarthritis, left knee: Secondary | ICD-10-CM | POA: Diagnosis not present

## 2023-11-16 DIAGNOSIS — Z4789 Encounter for other orthopedic aftercare: Secondary | ICD-10-CM | POA: Diagnosis not present

## 2023-11-16 DIAGNOSIS — I700 Atherosclerosis of aorta: Secondary | ICD-10-CM | POA: Diagnosis not present

## 2023-11-16 DIAGNOSIS — E782 Mixed hyperlipidemia: Secondary | ICD-10-CM | POA: Diagnosis not present

## 2023-11-16 DIAGNOSIS — M4807 Spinal stenosis, lumbosacral region: Secondary | ICD-10-CM | POA: Diagnosis not present

## 2023-11-16 DIAGNOSIS — N182 Chronic kidney disease, stage 2 (mild): Secondary | ICD-10-CM | POA: Diagnosis not present

## 2023-11-16 DIAGNOSIS — K219 Gastro-esophageal reflux disease without esophagitis: Secondary | ICD-10-CM | POA: Diagnosis not present

## 2023-11-16 DIAGNOSIS — J449 Chronic obstructive pulmonary disease, unspecified: Secondary | ICD-10-CM | POA: Diagnosis not present

## 2023-11-16 DIAGNOSIS — F410 Panic disorder [episodic paroxysmal anxiety] without agoraphobia: Secondary | ICD-10-CM | POA: Diagnosis not present

## 2023-11-16 DIAGNOSIS — G720 Drug-induced myopathy: Secondary | ICD-10-CM | POA: Diagnosis not present

## 2023-11-16 DIAGNOSIS — G43909 Migraine, unspecified, not intractable, without status migrainosus: Secondary | ICD-10-CM | POA: Diagnosis not present

## 2023-11-16 DIAGNOSIS — S90822D Blister (nonthermal), left foot, subsequent encounter: Secondary | ICD-10-CM | POA: Diagnosis not present

## 2023-11-16 DIAGNOSIS — I2510 Atherosclerotic heart disease of native coronary artery without angina pectoris: Secondary | ICD-10-CM | POA: Diagnosis not present

## 2023-11-16 DIAGNOSIS — Z9181 History of falling: Secondary | ICD-10-CM | POA: Diagnosis not present

## 2023-11-21 ENCOUNTER — Encounter (HOSPITAL_BASED_OUTPATIENT_CLINIC_OR_DEPARTMENT_OTHER): Admitting: General Surgery

## 2023-11-21 DIAGNOSIS — E11621 Type 2 diabetes mellitus with foot ulcer: Secondary | ICD-10-CM | POA: Diagnosis not present

## 2023-11-21 DIAGNOSIS — L97521 Non-pressure chronic ulcer of other part of left foot limited to breakdown of skin: Secondary | ICD-10-CM | POA: Diagnosis not present

## 2023-11-21 DIAGNOSIS — L97529 Non-pressure chronic ulcer of other part of left foot with unspecified severity: Secondary | ICD-10-CM | POA: Diagnosis not present

## 2023-11-21 DIAGNOSIS — S90822A Blister (nonthermal), left foot, initial encounter: Secondary | ICD-10-CM | POA: Diagnosis not present

## 2023-11-21 DIAGNOSIS — L97522 Non-pressure chronic ulcer of other part of left foot with fat layer exposed: Secondary | ICD-10-CM | POA: Diagnosis not present

## 2023-11-22 DIAGNOSIS — K219 Gastro-esophageal reflux disease without esophagitis: Secondary | ICD-10-CM | POA: Diagnosis not present

## 2023-11-22 DIAGNOSIS — E1122 Type 2 diabetes mellitus with diabetic chronic kidney disease: Secondary | ICD-10-CM | POA: Diagnosis not present

## 2023-11-22 DIAGNOSIS — M797 Fibromyalgia: Secondary | ICD-10-CM | POA: Diagnosis not present

## 2023-11-22 DIAGNOSIS — F410 Panic disorder [episodic paroxysmal anxiety] without agoraphobia: Secondary | ICD-10-CM | POA: Diagnosis not present

## 2023-11-22 DIAGNOSIS — K5901 Slow transit constipation: Secondary | ICD-10-CM | POA: Diagnosis not present

## 2023-11-22 DIAGNOSIS — I700 Atherosclerosis of aorta: Secondary | ICD-10-CM | POA: Diagnosis not present

## 2023-11-22 DIAGNOSIS — E559 Vitamin D deficiency, unspecified: Secondary | ICD-10-CM | POA: Diagnosis not present

## 2023-11-22 DIAGNOSIS — Z85038 Personal history of other malignant neoplasm of large intestine: Secondary | ICD-10-CM | POA: Diagnosis not present

## 2023-11-22 DIAGNOSIS — S90822D Blister (nonthermal), left foot, subsequent encounter: Secondary | ICD-10-CM | POA: Diagnosis not present

## 2023-11-22 DIAGNOSIS — E042 Nontoxic multinodular goiter: Secondary | ICD-10-CM | POA: Diagnosis not present

## 2023-11-22 DIAGNOSIS — Z9181 History of falling: Secondary | ICD-10-CM | POA: Diagnosis not present

## 2023-11-22 DIAGNOSIS — F331 Major depressive disorder, recurrent, moderate: Secondary | ICD-10-CM | POA: Diagnosis not present

## 2023-11-22 DIAGNOSIS — G720 Drug-induced myopathy: Secondary | ICD-10-CM | POA: Diagnosis not present

## 2023-11-22 DIAGNOSIS — E782 Mixed hyperlipidemia: Secondary | ICD-10-CM | POA: Diagnosis not present

## 2023-11-22 DIAGNOSIS — F17210 Nicotine dependence, cigarettes, uncomplicated: Secondary | ICD-10-CM | POA: Diagnosis not present

## 2023-11-22 DIAGNOSIS — I129 Hypertensive chronic kidney disease with stage 1 through stage 4 chronic kidney disease, or unspecified chronic kidney disease: Secondary | ICD-10-CM | POA: Diagnosis not present

## 2023-11-22 DIAGNOSIS — M4807 Spinal stenosis, lumbosacral region: Secondary | ICD-10-CM | POA: Diagnosis not present

## 2023-11-22 DIAGNOSIS — J449 Chronic obstructive pulmonary disease, unspecified: Secondary | ICD-10-CM | POA: Diagnosis not present

## 2023-11-22 DIAGNOSIS — Z4789 Encounter for other orthopedic aftercare: Secondary | ICD-10-CM | POA: Diagnosis not present

## 2023-11-22 DIAGNOSIS — N182 Chronic kidney disease, stage 2 (mild): Secondary | ICD-10-CM | POA: Diagnosis not present

## 2023-11-22 DIAGNOSIS — G43909 Migraine, unspecified, not intractable, without status migrainosus: Secondary | ICD-10-CM | POA: Diagnosis not present

## 2023-11-22 DIAGNOSIS — I2510 Atherosclerotic heart disease of native coronary artery without angina pectoris: Secondary | ICD-10-CM | POA: Diagnosis not present

## 2023-11-22 DIAGNOSIS — E039 Hypothyroidism, unspecified: Secondary | ICD-10-CM | POA: Diagnosis not present

## 2023-11-22 DIAGNOSIS — M1712 Unilateral primary osteoarthritis, left knee: Secondary | ICD-10-CM | POA: Diagnosis not present

## 2023-11-24 DIAGNOSIS — L97529 Non-pressure chronic ulcer of other part of left foot with unspecified severity: Secondary | ICD-10-CM | POA: Diagnosis not present

## 2023-11-28 ENCOUNTER — Encounter (HOSPITAL_BASED_OUTPATIENT_CLINIC_OR_DEPARTMENT_OTHER): Admitting: General Surgery

## 2023-11-28 DIAGNOSIS — L97529 Non-pressure chronic ulcer of other part of left foot with unspecified severity: Secondary | ICD-10-CM | POA: Diagnosis not present

## 2023-11-28 DIAGNOSIS — E11621 Type 2 diabetes mellitus with foot ulcer: Secondary | ICD-10-CM | POA: Diagnosis not present

## 2023-12-01 DIAGNOSIS — E042 Nontoxic multinodular goiter: Secondary | ICD-10-CM | POA: Diagnosis not present

## 2023-12-01 DIAGNOSIS — G720 Drug-induced myopathy: Secondary | ICD-10-CM | POA: Diagnosis not present

## 2023-12-01 DIAGNOSIS — M797 Fibromyalgia: Secondary | ICD-10-CM | POA: Diagnosis not present

## 2023-12-01 DIAGNOSIS — M4807 Spinal stenosis, lumbosacral region: Secondary | ICD-10-CM | POA: Diagnosis not present

## 2023-12-01 DIAGNOSIS — K219 Gastro-esophageal reflux disease without esophagitis: Secondary | ICD-10-CM | POA: Diagnosis not present

## 2023-12-01 DIAGNOSIS — I129 Hypertensive chronic kidney disease with stage 1 through stage 4 chronic kidney disease, or unspecified chronic kidney disease: Secondary | ICD-10-CM | POA: Diagnosis not present

## 2023-12-01 DIAGNOSIS — E1122 Type 2 diabetes mellitus with diabetic chronic kidney disease: Secondary | ICD-10-CM | POA: Diagnosis not present

## 2023-12-01 DIAGNOSIS — E782 Mixed hyperlipidemia: Secondary | ICD-10-CM | POA: Diagnosis not present

## 2023-12-01 DIAGNOSIS — E039 Hypothyroidism, unspecified: Secondary | ICD-10-CM | POA: Diagnosis not present

## 2023-12-01 DIAGNOSIS — Z9181 History of falling: Secondary | ICD-10-CM | POA: Diagnosis not present

## 2023-12-01 DIAGNOSIS — F331 Major depressive disorder, recurrent, moderate: Secondary | ICD-10-CM | POA: Diagnosis not present

## 2023-12-01 DIAGNOSIS — Z4789 Encounter for other orthopedic aftercare: Secondary | ICD-10-CM | POA: Diagnosis not present

## 2023-12-01 DIAGNOSIS — N182 Chronic kidney disease, stage 2 (mild): Secondary | ICD-10-CM | POA: Diagnosis not present

## 2023-12-01 DIAGNOSIS — I2510 Atherosclerotic heart disease of native coronary artery without angina pectoris: Secondary | ICD-10-CM | POA: Diagnosis not present

## 2023-12-01 DIAGNOSIS — Z85038 Personal history of other malignant neoplasm of large intestine: Secondary | ICD-10-CM | POA: Diagnosis not present

## 2023-12-01 DIAGNOSIS — E559 Vitamin D deficiency, unspecified: Secondary | ICD-10-CM | POA: Diagnosis not present

## 2023-12-01 DIAGNOSIS — F410 Panic disorder [episodic paroxysmal anxiety] without agoraphobia: Secondary | ICD-10-CM | POA: Diagnosis not present

## 2023-12-01 DIAGNOSIS — I700 Atherosclerosis of aorta: Secondary | ICD-10-CM | POA: Diagnosis not present

## 2023-12-01 DIAGNOSIS — F17210 Nicotine dependence, cigarettes, uncomplicated: Secondary | ICD-10-CM | POA: Diagnosis not present

## 2023-12-01 DIAGNOSIS — M1712 Unilateral primary osteoarthritis, left knee: Secondary | ICD-10-CM | POA: Diagnosis not present

## 2023-12-01 DIAGNOSIS — G43909 Migraine, unspecified, not intractable, without status migrainosus: Secondary | ICD-10-CM | POA: Diagnosis not present

## 2023-12-01 DIAGNOSIS — K5901 Slow transit constipation: Secondary | ICD-10-CM | POA: Diagnosis not present

## 2023-12-01 DIAGNOSIS — J449 Chronic obstructive pulmonary disease, unspecified: Secondary | ICD-10-CM | POA: Diagnosis not present

## 2023-12-01 DIAGNOSIS — S90822D Blister (nonthermal), left foot, subsequent encounter: Secondary | ICD-10-CM | POA: Diagnosis not present

## 2023-12-05 ENCOUNTER — Encounter (HOSPITAL_BASED_OUTPATIENT_CLINIC_OR_DEPARTMENT_OTHER): Attending: General Surgery | Admitting: General Surgery

## 2023-12-05 DIAGNOSIS — L139 Bullous disorder, unspecified: Secondary | ICD-10-CM | POA: Diagnosis not present

## 2023-12-05 DIAGNOSIS — L97529 Non-pressure chronic ulcer of other part of left foot with unspecified severity: Secondary | ICD-10-CM | POA: Insufficient documentation

## 2023-12-05 DIAGNOSIS — E11621 Type 2 diabetes mellitus with foot ulcer: Secondary | ICD-10-CM | POA: Insufficient documentation

## 2023-12-05 DIAGNOSIS — L97521 Non-pressure chronic ulcer of other part of left foot limited to breakdown of skin: Secondary | ICD-10-CM | POA: Diagnosis not present

## 2023-12-07 ENCOUNTER — Ambulatory Visit: Payer: Medicare Other | Admitting: Podiatry

## 2023-12-07 DIAGNOSIS — G720 Drug-induced myopathy: Secondary | ICD-10-CM | POA: Diagnosis not present

## 2023-12-07 DIAGNOSIS — B351 Tinea unguium: Secondary | ICD-10-CM

## 2023-12-07 DIAGNOSIS — J449 Chronic obstructive pulmonary disease, unspecified: Secondary | ICD-10-CM | POA: Diagnosis not present

## 2023-12-07 DIAGNOSIS — M4807 Spinal stenosis, lumbosacral region: Secondary | ICD-10-CM | POA: Diagnosis not present

## 2023-12-07 DIAGNOSIS — M79675 Pain in left toe(s): Secondary | ICD-10-CM | POA: Diagnosis not present

## 2023-12-07 DIAGNOSIS — I700 Atherosclerosis of aorta: Secondary | ICD-10-CM | POA: Diagnosis not present

## 2023-12-07 DIAGNOSIS — S90822D Blister (nonthermal), left foot, subsequent encounter: Secondary | ICD-10-CM | POA: Diagnosis not present

## 2023-12-07 DIAGNOSIS — Z85038 Personal history of other malignant neoplasm of large intestine: Secondary | ICD-10-CM | POA: Diagnosis not present

## 2023-12-07 DIAGNOSIS — E1122 Type 2 diabetes mellitus with diabetic chronic kidney disease: Secondary | ICD-10-CM | POA: Diagnosis not present

## 2023-12-07 DIAGNOSIS — E782 Mixed hyperlipidemia: Secondary | ICD-10-CM | POA: Diagnosis not present

## 2023-12-07 DIAGNOSIS — M79674 Pain in right toe(s): Secondary | ICD-10-CM

## 2023-12-07 DIAGNOSIS — M797 Fibromyalgia: Secondary | ICD-10-CM | POA: Diagnosis not present

## 2023-12-07 DIAGNOSIS — I129 Hypertensive chronic kidney disease with stage 1 through stage 4 chronic kidney disease, or unspecified chronic kidney disease: Secondary | ICD-10-CM | POA: Diagnosis not present

## 2023-12-07 DIAGNOSIS — Z4789 Encounter for other orthopedic aftercare: Secondary | ICD-10-CM | POA: Diagnosis not present

## 2023-12-07 DIAGNOSIS — F331 Major depressive disorder, recurrent, moderate: Secondary | ICD-10-CM | POA: Diagnosis not present

## 2023-12-07 DIAGNOSIS — I2510 Atherosclerotic heart disease of native coronary artery without angina pectoris: Secondary | ICD-10-CM | POA: Diagnosis not present

## 2023-12-07 DIAGNOSIS — K5901 Slow transit constipation: Secondary | ICD-10-CM | POA: Diagnosis not present

## 2023-12-07 DIAGNOSIS — F410 Panic disorder [episodic paroxysmal anxiety] without agoraphobia: Secondary | ICD-10-CM | POA: Diagnosis not present

## 2023-12-07 DIAGNOSIS — E559 Vitamin D deficiency, unspecified: Secondary | ICD-10-CM | POA: Diagnosis not present

## 2023-12-07 DIAGNOSIS — E039 Hypothyroidism, unspecified: Secondary | ICD-10-CM | POA: Diagnosis not present

## 2023-12-07 DIAGNOSIS — G43909 Migraine, unspecified, not intractable, without status migrainosus: Secondary | ICD-10-CM | POA: Diagnosis not present

## 2023-12-07 DIAGNOSIS — N182 Chronic kidney disease, stage 2 (mild): Secondary | ICD-10-CM | POA: Diagnosis not present

## 2023-12-07 DIAGNOSIS — Z9181 History of falling: Secondary | ICD-10-CM | POA: Diagnosis not present

## 2023-12-07 DIAGNOSIS — F17210 Nicotine dependence, cigarettes, uncomplicated: Secondary | ICD-10-CM | POA: Diagnosis not present

## 2023-12-07 DIAGNOSIS — M1712 Unilateral primary osteoarthritis, left knee: Secondary | ICD-10-CM | POA: Diagnosis not present

## 2023-12-07 DIAGNOSIS — K219 Gastro-esophageal reflux disease without esophagitis: Secondary | ICD-10-CM | POA: Diagnosis not present

## 2023-12-07 DIAGNOSIS — E042 Nontoxic multinodular goiter: Secondary | ICD-10-CM | POA: Diagnosis not present

## 2023-12-07 NOTE — Progress Notes (Signed)
  Subjective:  Patient ID: [name], female    DOB: 1950-11-16,  MRN: [id number]  Chief Complaint  Patient presents with   Nail Problem    Nail trim    73 y.o. female returns for the above complaint.  Patient presents with thickened elongated dystrophic toenails x10 mild pain on palpation.  Hurts with ambulation hurts with pressure.  She would like for me to be down she is not able to do it herself.  She is a diabetic with unknown A1c  Objective:   There were no vitals filed for this visit.  Podiatric Exam: Vascular: dorsalis pedis and posterior tibial pulses are palpable bilateral. Capillary return is immediate. Temperature gradient is WNL. Skin turgor WNL  Sensorium: Normal Semmes Weinstein monofilament test. Normal tactile sensation bilaterally. Nail Exam: Pt has thick disfigured discolored nails with subungual debris noted bilateral entire nail hallux through fifth toenails.  Pain on palpation to the nails. Ulcer Exam: There is no evidence of ulcer or pre-ulcerative changes or infection. Orthopedic Exam: Muscle tone and strength are WNL. No limitations in general ROM. No crepitus or effusions noted.  Skin: No Porokeratosis. No infection or ulcers    Assessment & Plan:   No diagnosis found.    Patient was evaluated and treated and all questions answered.  Onychomycosis with pain  -Nails palliatively debrided as below. -Educated on self-care  Procedure: Nail Debridement Rationale: pain  Type of Debridement: manual, sharp debridement. Instrumentation: Nail nipper, rotary burr. Number of Nails: 10  Procedures and Treatment: Consent by patient was obtained for treatment procedures. The patient understood the discussion of treatment and procedures well. All questions were answered thoroughly reviewed. Debridement of mycotic and hypertrophic toenails, 1 through 5 bilateral and clearing of subungual debris. No ulceration, no infection noted.  Return Visit-Office Procedure:  Patient instructed to return to the office for a follow up visit 3 months for continued evaluation and treatment.  Tinnie Forehand, DPM    No follow-ups on file.

## 2023-12-09 ENCOUNTER — Telehealth: Payer: Self-pay

## 2023-12-09 ENCOUNTER — Telehealth: Payer: Self-pay | Admitting: Acute Care

## 2023-12-09 NOTE — Telephone Encounter (Signed)
 Call Report from Tiffany  IMPRESSION: Lung-RADS 4B, suspicious. Additional imaging evaluation or consultation with Pulmonology or Thoracic Surgery recommended. New lingular nodule of 11 mm, highly suspicious for primary bronchogenic carcinoma. Consider PET to direct tissue sampling.

## 2023-12-09 NOTE — Telephone Encounter (Signed)
 I have called Ms. Justis with the results of her low-dose screening CT.  Scan was done 11/04/2023 and read 12/09/2023. The scan was read as a lung RADS 4B. Patient has a new linear nodule measuring 11 mm in the left lobe that is concerning for primary bronchogenic carcinoma. Patient does have a history of colon cancer I have ordered a PET scan and the patient will come into the office to see me within 1 week of PET scan to review the results and determine next best steps.  Patient is in agreement with this plan and verbalized understanding of the above. Lex Redbird, and [place] please fax results to PCP and let her know plan is for a PET scan with close follow-up to determine next best steps.  Thank you so much

## 2023-12-12 ENCOUNTER — Encounter (HOSPITAL_BASED_OUTPATIENT_CLINIC_OR_DEPARTMENT_OTHER): Admitting: Internal Medicine

## 2023-12-12 DIAGNOSIS — L97521 Non-pressure chronic ulcer of other part of left foot limited to breakdown of skin: Secondary | ICD-10-CM | POA: Diagnosis not present

## 2023-12-12 DIAGNOSIS — E11621 Type 2 diabetes mellitus with foot ulcer: Secondary | ICD-10-CM | POA: Diagnosis not present

## 2023-12-12 DIAGNOSIS — L139 Bullous disorder, unspecified: Secondary | ICD-10-CM | POA: Diagnosis not present

## 2023-12-12 DIAGNOSIS — L97529 Non-pressure chronic ulcer of other part of left foot with unspecified severity: Secondary | ICD-10-CM | POA: Diagnosis not present

## 2023-12-12 NOTE — Telephone Encounter (Signed)
 See provider note 5/9

## 2023-12-12 NOTE — Telephone Encounter (Signed)
 Results and plan to PCP.

## 2023-12-14 ENCOUNTER — Other Ambulatory Visit: Payer: Self-pay

## 2023-12-14 DIAGNOSIS — R911 Solitary pulmonary nodule: Secondary | ICD-10-CM

## 2023-12-14 DIAGNOSIS — E042 Nontoxic multinodular goiter: Secondary | ICD-10-CM | POA: Diagnosis not present

## 2023-12-14 DIAGNOSIS — S90822D Blister (nonthermal), left foot, subsequent encounter: Secondary | ICD-10-CM | POA: Diagnosis not present

## 2023-12-14 DIAGNOSIS — G43909 Migraine, unspecified, not intractable, without status migrainosus: Secondary | ICD-10-CM | POA: Diagnosis not present

## 2023-12-14 DIAGNOSIS — I129 Hypertensive chronic kidney disease with stage 1 through stage 4 chronic kidney disease, or unspecified chronic kidney disease: Secondary | ICD-10-CM | POA: Diagnosis not present

## 2023-12-14 DIAGNOSIS — E559 Vitamin D deficiency, unspecified: Secondary | ICD-10-CM | POA: Diagnosis not present

## 2023-12-14 DIAGNOSIS — G720 Drug-induced myopathy: Secondary | ICD-10-CM | POA: Diagnosis not present

## 2023-12-14 DIAGNOSIS — M797 Fibromyalgia: Secondary | ICD-10-CM | POA: Diagnosis not present

## 2023-12-14 DIAGNOSIS — E782 Mixed hyperlipidemia: Secondary | ICD-10-CM | POA: Diagnosis not present

## 2023-12-14 DIAGNOSIS — N182 Chronic kidney disease, stage 2 (mild): Secondary | ICD-10-CM | POA: Diagnosis not present

## 2023-12-14 DIAGNOSIS — F17210 Nicotine dependence, cigarettes, uncomplicated: Secondary | ICD-10-CM | POA: Diagnosis not present

## 2023-12-14 DIAGNOSIS — F331 Major depressive disorder, recurrent, moderate: Secondary | ICD-10-CM | POA: Diagnosis not present

## 2023-12-14 DIAGNOSIS — Z9181 History of falling: Secondary | ICD-10-CM | POA: Diagnosis not present

## 2023-12-14 DIAGNOSIS — J449 Chronic obstructive pulmonary disease, unspecified: Secondary | ICD-10-CM | POA: Diagnosis not present

## 2023-12-14 DIAGNOSIS — E1122 Type 2 diabetes mellitus with diabetic chronic kidney disease: Secondary | ICD-10-CM | POA: Diagnosis not present

## 2023-12-14 DIAGNOSIS — Z4789 Encounter for other orthopedic aftercare: Secondary | ICD-10-CM | POA: Diagnosis not present

## 2023-12-14 DIAGNOSIS — E039 Hypothyroidism, unspecified: Secondary | ICD-10-CM | POA: Diagnosis not present

## 2023-12-14 DIAGNOSIS — Z122 Encounter for screening for malignant neoplasm of respiratory organs: Secondary | ICD-10-CM

## 2023-12-14 DIAGNOSIS — M4807 Spinal stenosis, lumbosacral region: Secondary | ICD-10-CM | POA: Diagnosis not present

## 2023-12-14 DIAGNOSIS — Z85038 Personal history of other malignant neoplasm of large intestine: Secondary | ICD-10-CM | POA: Diagnosis not present

## 2023-12-14 DIAGNOSIS — L139 Bullous disorder, unspecified: Secondary | ICD-10-CM | POA: Diagnosis not present

## 2023-12-14 DIAGNOSIS — I2510 Atherosclerotic heart disease of native coronary artery without angina pectoris: Secondary | ICD-10-CM | POA: Diagnosis not present

## 2023-12-14 DIAGNOSIS — Z87891 Personal history of nicotine dependence: Secondary | ICD-10-CM

## 2023-12-14 DIAGNOSIS — M1712 Unilateral primary osteoarthritis, left knee: Secondary | ICD-10-CM | POA: Diagnosis not present

## 2023-12-14 DIAGNOSIS — K5901 Slow transit constipation: Secondary | ICD-10-CM | POA: Diagnosis not present

## 2023-12-14 DIAGNOSIS — F410 Panic disorder [episodic paroxysmal anxiety] without agoraphobia: Secondary | ICD-10-CM | POA: Diagnosis not present

## 2023-12-14 DIAGNOSIS — K219 Gastro-esophageal reflux disease without esophagitis: Secondary | ICD-10-CM | POA: Diagnosis not present

## 2023-12-14 DIAGNOSIS — I700 Atherosclerosis of aorta: Secondary | ICD-10-CM | POA: Diagnosis not present

## 2023-12-20 ENCOUNTER — Encounter (HOSPITAL_COMMUNITY)
Admission: RE | Admit: 2023-12-20 | Discharge: 2023-12-20 | Disposition: A | Discharge status: Home / Self Care | Source: Ambulatory Visit | Attending: Acute Care | Admitting: Acute Care

## 2023-12-20 DIAGNOSIS — R911 Solitary pulmonary nodule: Secondary | ICD-10-CM | POA: Diagnosis not present

## 2023-12-20 DIAGNOSIS — Z87891 Personal history of nicotine dependence: Secondary | ICD-10-CM | POA: Diagnosis not present

## 2023-12-20 DIAGNOSIS — Z122 Encounter for screening for malignant neoplasm of respiratory organs: Secondary | ICD-10-CM | POA: Diagnosis not present

## 2023-12-20 LAB — GLUCOSE, CAPILLARY: Glucose-Capillary: 152 mg/dL — ABNORMAL HIGH (ref 70–99)

## 2023-12-20 MED ORDER — FLUDEOXYGLUCOSE F - 18 (FDG) INJECTION
8.8000 | Freq: Once | INTRAVENOUS | Status: AC
  Administered 2023-12-20: 8.8 via INTRAVENOUS

## 2023-12-21 ENCOUNTER — Other Ambulatory Visit: Payer: Self-pay | Admitting: Family Medicine

## 2023-12-21 DIAGNOSIS — M4807 Spinal stenosis, lumbosacral region: Secondary | ICD-10-CM | POA: Diagnosis not present

## 2023-12-21 DIAGNOSIS — M797 Fibromyalgia: Secondary | ICD-10-CM | POA: Diagnosis not present

## 2023-12-21 DIAGNOSIS — Z9181 History of falling: Secondary | ICD-10-CM | POA: Diagnosis not present

## 2023-12-21 DIAGNOSIS — F410 Panic disorder [episodic paroxysmal anxiety] without agoraphobia: Secondary | ICD-10-CM | POA: Diagnosis not present

## 2023-12-21 DIAGNOSIS — I129 Hypertensive chronic kidney disease with stage 1 through stage 4 chronic kidney disease, or unspecified chronic kidney disease: Secondary | ICD-10-CM | POA: Diagnosis not present

## 2023-12-21 DIAGNOSIS — F331 Major depressive disorder, recurrent, moderate: Secondary | ICD-10-CM | POA: Diagnosis not present

## 2023-12-21 DIAGNOSIS — J449 Chronic obstructive pulmonary disease, unspecified: Secondary | ICD-10-CM | POA: Diagnosis not present

## 2023-12-21 DIAGNOSIS — N182 Chronic kidney disease, stage 2 (mild): Secondary | ICD-10-CM | POA: Diagnosis not present

## 2023-12-21 DIAGNOSIS — K5901 Slow transit constipation: Secondary | ICD-10-CM | POA: Diagnosis not present

## 2023-12-21 DIAGNOSIS — E559 Vitamin D deficiency, unspecified: Secondary | ICD-10-CM | POA: Diagnosis not present

## 2023-12-21 DIAGNOSIS — S90822D Blister (nonthermal), left foot, subsequent encounter: Secondary | ICD-10-CM | POA: Diagnosis not present

## 2023-12-21 DIAGNOSIS — R935 Abnormal findings on diagnostic imaging of other abdominal regions, including retroperitoneum: Secondary | ICD-10-CM

## 2023-12-21 DIAGNOSIS — G720 Drug-induced myopathy: Secondary | ICD-10-CM | POA: Diagnosis not present

## 2023-12-21 DIAGNOSIS — I2510 Atherosclerotic heart disease of native coronary artery without angina pectoris: Secondary | ICD-10-CM | POA: Diagnosis not present

## 2023-12-21 DIAGNOSIS — E1122 Type 2 diabetes mellitus with diabetic chronic kidney disease: Secondary | ICD-10-CM | POA: Diagnosis not present

## 2023-12-21 DIAGNOSIS — I700 Atherosclerosis of aorta: Secondary | ICD-10-CM | POA: Diagnosis not present

## 2023-12-21 DIAGNOSIS — G43909 Migraine, unspecified, not intractable, without status migrainosus: Secondary | ICD-10-CM | POA: Diagnosis not present

## 2023-12-21 DIAGNOSIS — Z4789 Encounter for other orthopedic aftercare: Secondary | ICD-10-CM | POA: Diagnosis not present

## 2023-12-21 DIAGNOSIS — M1712 Unilateral primary osteoarthritis, left knee: Secondary | ICD-10-CM | POA: Diagnosis not present

## 2023-12-21 DIAGNOSIS — Z85038 Personal history of other malignant neoplasm of large intestine: Secondary | ICD-10-CM | POA: Diagnosis not present

## 2023-12-21 DIAGNOSIS — E782 Mixed hyperlipidemia: Secondary | ICD-10-CM | POA: Diagnosis not present

## 2023-12-21 DIAGNOSIS — K219 Gastro-esophageal reflux disease without esophagitis: Secondary | ICD-10-CM | POA: Diagnosis not present

## 2023-12-21 DIAGNOSIS — E039 Hypothyroidism, unspecified: Secondary | ICD-10-CM | POA: Diagnosis not present

## 2023-12-21 DIAGNOSIS — F17210 Nicotine dependence, cigarettes, uncomplicated: Secondary | ICD-10-CM | POA: Diagnosis not present

## 2023-12-21 DIAGNOSIS — E042 Nontoxic multinodular goiter: Secondary | ICD-10-CM | POA: Diagnosis not present

## 2023-12-23 ENCOUNTER — Other Ambulatory Visit: Payer: Self-pay | Admitting: Family Medicine

## 2023-12-23 DIAGNOSIS — R935 Abnormal findings on diagnostic imaging of other abdominal regions, including retroperitoneum: Secondary | ICD-10-CM

## 2023-12-27 ENCOUNTER — Other Ambulatory Visit

## 2023-12-27 DIAGNOSIS — R21 Rash and other nonspecific skin eruption: Secondary | ICD-10-CM | POA: Diagnosis not present

## 2023-12-27 DIAGNOSIS — D485 Neoplasm of uncertain behavior of skin: Secondary | ICD-10-CM | POA: Diagnosis not present

## 2023-12-27 DIAGNOSIS — L988 Other specified disorders of the skin and subcutaneous tissue: Secondary | ICD-10-CM | POA: Diagnosis not present

## 2023-12-28 ENCOUNTER — Encounter: Payer: Self-pay | Admitting: Acute Care

## 2023-12-28 ENCOUNTER — Ambulatory Visit: Admitting: Acute Care

## 2023-12-28 VITALS — BP 130/80 | HR 103 | Ht 63.0 in | Wt 178.2 lb

## 2023-12-28 DIAGNOSIS — R911 Solitary pulmonary nodule: Secondary | ICD-10-CM

## 2023-12-28 DIAGNOSIS — Z85038 Personal history of other malignant neoplasm of large intestine: Secondary | ICD-10-CM

## 2023-12-28 DIAGNOSIS — F17210 Nicotine dependence, cigarettes, uncomplicated: Secondary | ICD-10-CM | POA: Diagnosis not present

## 2023-12-28 NOTE — Addendum Note (Signed)
 Addended by: Veryl Gottron on: 12/28/2023 01:09 PM   Modules accepted: Orders

## 2023-12-28 NOTE — Patient Instructions (Addendum)
 It is good to see you today.  Your PET scan did not show any uptake in the nodule of concern, and the nodule appeared a bit smaller on the more recent study..  This is great news. We will do a 3 month follow up scan after 03/21/2024 to re-evaluate the lung nodule. You will get a call to get this scheduled. You will follow up with me after the scan to re-evaluate the lung nodule we are following. Call us  if you need us  before hand. Please contact office for sooner follow up if symptoms do not improve or worsen or seek emergency care

## 2023-12-28 NOTE — Progress Notes (Signed)
 History of Present Illness [name] is a 73 y.o. female every day smoker ( 40.8 pack year smoking history ) seen in consult today for an abnormal lung cancer screening scan. She has a history of colon cancer. She is followed through the screening program, and Dr. Baldwin Levee.   12/28/2023 Pt. Presents for consult for abnormal lung cancer screening scan. She had a scan 11/2023 read as a 4 B. There is a new  lingular  11 mm nodule , highly suspicious for primary bronchogenic carcinoma. Pt is a current every day smoker , so she is high risk. She also has a history of Stage 1  colon cancer on 2006 ( S/P Colectomy) . She denies any weight loss or hemoptysis. She states she did not have any illnesses at the time of the screening scan. She does have a family history of cancer ( unspecified) in her father , maternal aunt and maternal uncle.  She has had some college, but no degree.   We did a PET scan to better evaluate the nodule on 12/20/2023. This showed the nodule of concern was not hypermetabolic , and appeared to have decreased in size from 11 mm to 9 mm. Plan will be for a 3 month follow up LDCT due 3 months after the PET scan , so August 2025 . Pt. Is In agreement with this plan. She does not have any breathing issues at this time. She was counseled to quit smoking x 3 minutes today.  Test Results: PET scan 12/20/2023 No abnormal uptake seen above blood pool in the axillary region, hilum or mediastinum. The spiculated nodule in the posterior aspect of the lingula on the previous examination which measured 11 mm, today appears less well defined. Maximal dimension 9 mm on image 66 of today's CT scan. No abnormal uptake. No other areas of abnormal lung uptake. Several other tiny lung nodule on the prior are not as well defined on this PET-CT examination. This could be technical. There is a right-sided air cyst in the lower lobe.  LDCT 11/04/2023, read 12/09/2023 Lung-RADS 4B, suspicious. Additional  imaging evaluation or consultation with Pulmonology or Thoracic Surgery recommended. New lingular nodule of 11 mm, highly suspicious for primary bronchogenic carcinoma. Consider PET to direct tissue sampling.        Latest Ref Rng & Units 06/16/2022    1:30 PM 07/31/2021   10:58 AM 04/14/2020    5:16 PM  CBC  WBC 4.0 - 10.5 K/uL 9.6  11.5  11.9   Hemoglobin 12.0 - 15.0 g/dL 14.7  82.9  56.2   Hematocrit 36.0 - 46.0 % 42.3  41.9  43.7   Platelets 150 - 400 K/uL 207  252  274        Latest Ref Rng & Units 06/16/2022    1:30 PM 07/31/2021   10:58 AM 04/14/2020    5:16 PM  BMP  Glucose 70 - 99 mg/dL 130  865  784   BUN 8 - 23 mg/dL 11  13  16    Creatinine 0.44 - 1.00 mg/dL 6.96  2.95  2.84   Sodium 135 - 145 mmol/L 139  137  138   Potassium 3.5 - 5.1 mmol/L 4.0  4.1  3.9   Chloride 98 - 111 mmol/L 101  102  102   CO2 22 - 32 mmol/L 29  28  26    Calcium  8.9 - 10.3 mg/dL 9.3  8.9  9.3     BNP  Component Value Date/Time   BNP 20.9 10/24/2020 0000    ProBNP No results found for: "PROBNP"  PFT    Component Value Date/Time   FEV1PRE 2.00 10/30/2020 1333   FEV1POST 1.93 10/30/2020 1333   FVCPRE 2.55 10/30/2020 1333   FVCPOST 2.51 10/30/2020 1333   TLC 5.17 10/30/2020 1333   DLCOUNC 14.84 10/30/2020 1333   PREFEV1FVCRT 78 10/30/2020 1333   PSTFEV1FVCRT 77 10/30/2020 1333    NM PET Image Initial (PI) Skull Base To Thigh Result Date: 12/22/2023 CLINICAL DATA:  Initial treatment strategy for lung nodule. EXAM: NUCLEAR MEDICINE PET SKULL BASE TO THIGH TECHNIQUE: 8.8 mCi F-18 FDG was injected intravenously. Full-ring PET imaging was performed from the skull base to thigh after the radiotracer. CT data was obtained and used for attenuation correction and anatomic localization. Fasting blood glucose: 157 mg/dl COMPARISON:  CT 40/98/1191 and older FINDINGS: Mediastinal blood pool activity: SUV max 3.1 Liver activity: SUV max 4.2 NECK: There is no specific abnormal uptake  identified in the neck including along lymph node change of the submandibular, posterior triangle or internal jugular region. Near symmetric uptake of the visualized intracranial compartment. Incidental CT findings: The parotid glands, submandibular glands are unremarkable. Cystic nodule along the left thyroid  lobe is again seen as prescribed but does not have any abnormal uptake. Please correlate with prior thyroid  workup including ultrasound of 06/03/2022. Significant streak artifact related to the patient's dental hardware. Paranasal sinuses and mastoid air cells are clear. CHEST: No abnormal uptake seen above blood pool in the axillary region, hilum or mediastinum. The spiculated nodule in the posterior aspect of the lingula on the previous examination which measured 11 mm, today appears less well defined. Maximal dimension 9 mm on image 66 of today's CT scan. No abnormal uptake. No other areas of abnormal lung uptake. Several other tiny lung nodule on the prior are not as well defined on this PET-CT examination. This could be technical. There is a right-sided air cyst in the lower lobe. Incidental CT findings: No pleural effusion or pneumothorax. No consolidation. Heart is nonenlarged. No pericardial effusion. Coronary artery calcifications are seen. The thoracic aorta has a normal course and caliber. Slight ectasia of the aortic arch. Small hiatal hernia. ABDOMEN/PELVIS: There is physiologic distribution radiotracer along the parenchymal organs, bowel and renal collecting systems. Incidental CT findings: With the limits of the noncontrast, attenuation correction CT, the liver is grossly preserved as is the spleen, adrenal glands. Previous cholecystectomy. Atrophy of the pancreas. No abnormal calcifications seen within either kidney nor along the course of either ureter. Contracted urinary bladder. Grossly the stomach, small bowel and large bowel are nondilated. Appendix is not well seen. Diffuse vascular  calcifications identified no free air or free fluid. Rectus muscle diastasis seen with some midline anterior abdominal wall fat containing hernias. SKELETON: No abnormal uptake along the visualized osseous structures. Incidental CT findings: Scattered degenerative changes identified along the spine. Streak artifact related to the fixation hardware of the lower lumbar spine, L5-S1. Degenerative changes seen along the pelvis and shoulders. IMPRESSION: Lingular spiculated nodule appears slightly smaller today compared to the prior CT. There is also no specific abnormal radiotracer uptake at this time. Recommend follow-up CT imaging in 3 months. No additional areas of abnormal radiotracer uptake. Electronically Signed   By: Adrianna Horde M.D.   On: 12/22/2023 11:17     Past medical hx Past Medical History:  Diagnosis Date   Alcohol abuse, in remission    Anxiety  Cancer of sigmoid colon (HCC) 07/2005   T2N0M0   Chronic bronchitis    COPD (chronic obstructive pulmonary disease) (HCC)    Depression    Diabetes mellitus without complication (HCC)    GERD (gastroesophageal reflux disease)    History of thyroid  nodule    monitoring   Hyperlipidemia    Hypertension    Migraines    OA (osteoarthritis)    PONV (postoperative nausea and vomiting)    Reflux      Social History   Tobacco Use   Smoking status: Every Day    Current packs/day: 0.75    Average packs/day: 0.8 packs/day for 54.4 years (40.8 ttl pk-yrs)    Types: Cigarettes, E-cigarettes    Start date: 1971   Smokeless tobacco: Never   Tobacco comments:    0.5 ppd of cigerettes , using e cigarette   Vaping Use   Vaping status: Some Days   Substances: Nicotine, Flavoring  Substance Use Topics   Alcohol use: No    Comment: recovering  alcoholic   Drug use: No    [name] reports that she has been smoking cigarettes and e-cigarettes. She started smoking about 54 years ago. She has a 40.8 pack-year smoking history. She has  never used smokeless tobacco. She reports that she does not drink alcohol and does not use drugs.  Tobacco Cessation: Ready to quit: Not Answered Counseling given: Not Answered Tobacco comments: 0.5 ppd of cigerettes , using e cigarette  Current every day smoker , 1/2 PPD Counseled to quit  Past surgical hx, Family hx, Social hx all reviewed.  Current Outpatient Medications on File Prior to Visit  Medication Sig   amitriptyline  (ELAVIL ) 25 MG tablet Take 100 mg by mouth at bedtime.   ARIPiprazole  (ABILIFY ) 20 MG tablet Take 20 mg by mouth at bedtime.   calcium  carbonate (TUMS - DOSED IN MG ELEMENTAL CALCIUM ) 500 MG chewable tablet Chew 2 tablets by mouth 4 (four) times daily as needed for indigestion or heartburn.   famotidine  (PEPCID ) 20 MG tablet Take 20 mg by mouth in the morning.   metFORMIN  (GLUCOPHAGE -XR) 500 MG 24 hr tablet Take 1,000 mg by mouth every evening.   predniSONE (DELTASONE) 20 MG tablet Take 20 mg by mouth daily with breakfast.   rosuvastatin  (CRESTOR ) 5 MG tablet Take 5 mg by mouth daily.   valsartan-hydrochlorothiazide (DIOVAN-HCT) 160-12.5 MG tablet Take 1 tablet by mouth daily.   No current facility-administered medications on file prior to visit.     Allergies  Allergen Reactions   Benadryl  [Diphenhydramine  Hcl] Nausea And Vomiting and Other (See Comments)    Migraine headaches.   Lansoprazole Nausea And Vomiting and Other (See Comments)    Migraine headache  Prevacid* due to dye in med    Morphine Sulfate Nausea And Vomiting and Other (See Comments)    Migraine headaches.   Red Dye #40 (Allura Red) Other (See Comments)    Also allergic to [facility] Dye-migraine headaches.   Simvastatin Nausea And Vomiting and Other (See Comments)    Migraine    Codeine Nausea And Vomiting   Penicillins Nausea And Vomiting and Other (See Comments)    INTOLERANCE >  NAUSEA & VOMITING YEAST INFECTION > CHANGE IN NORMAL FLORA  Has patient had a PCN reaction causing  immediate rash, facial/tongue/throat swelling, SOB or lightheadedness with hypotension:No Has patient had a PCN reaction causing severe rash involving mucus membranes or skin necrosis: No Has patient had a PCN reaction that required hospitalization  No Has patient had a PCN reaction occurring within the last 10 years:No    Shellfish Allergy Other (See Comments)    Congestion   Sulfa Antibiotics Nausea And Vomiting    Review Of Systems:  Constitutional:   No  weight loss, night sweats,  Fevers, chills, fatigue, or  lassitude.  HEENT:   No headaches,  Difficulty swallowing,  Tooth/dental problems, or  Sore throat,                No sneezing, itching, ear ache, nasal congestion, post nasal drip,   CV:  No chest pain,  Orthopnea, PND, swelling in lower extremities, anasarca, dizziness, palpitations, syncope.   GI  No heartburn, indigestion, abdominal pain, nausea, vomiting, diarrhea, change in bowel habits, loss of appetite, bloody stools.   Resp: No shortness of breath with exertion or at rest.  No excess mucus, no productive cough,  No non-productive cough,  No coughing up of blood.  No change in color of mucus.  No wheezing.  No chest wall deformity  Skin: no rash or lesions.  GU: no dysuria, change in color of urine, no urgency or frequency.  No flank pain, no hematuria   MS:  No joint pain or swelling.  No decreased range of motion.  No back pain.  Psych:  No change in mood or affect. No depression or anxiety.  No memory loss.   Vital Signs BP 130/80 (BP Location: Left Arm, Patient Position: Sitting, Cuff Size: Normal)   Pulse (!) 103   Ht 5\' 3"  (1.6 m)   Wt 178 lb 3.2 oz (80.8 kg)   SpO2 97%   BMI 31.57 kg/m    Physical Exam:  General- No distress,  A&Ox3, pleasant ENT: No sinus tenderness, TM clear, pale nasal mucosa, no oral exudate,no post nasal drip, no LAN Cardiac: S1, S2, regular rate and rhythm, no murmur Chest: No wheeze/ rales/ dullness; no accessory muscle  use, no nasal flaring, no sternal retractions, , diminished per bases Abd.: Soft Non-tender, ND, BS +, Body mass index is 31.57 kg/m.  Ext: No clubbing cyanosis, edema, no obvious deformities Neuro:  normal strength, MAE x 4, A&O x 3, appropriate Skin: No rashes, warm and dry, no obvious skin lesions  Psych: normal mood and behavior   Assessment/Plan Abnormal lung cancer screening CT Chest >> 4 B Current every day smoker  History of lung cancer Plan Your PET scan did not show any uptake in the nodule of concern, and the nodule appeared a bit smaller on the more recent study..  This is great news. We will do a 3 month follow up scan after 03/21/2024 to re-evaluate the lung nodule. You will get a call to get this scheduled. You will follow up with me after the scan to re-evaluate the lung nodule we are following. Call us  if you need us  before hand. Please contact office for sooner follow up if symptoms do not improve or worsen or seek emergency care    I spent 20 minutes dedicated to the care of this patient on the date of this encounter to include pre-visit review of records, face-to-face time with the patient discussing conditions above, post visit ordering of testing, clinical documentation with the electronic health record, making appropriate referrals as documented, and communicating necessary information to the patient's healthcare team.    Raejean Bullock, NP 12/28/2023  11:20 AM

## 2023-12-29 ENCOUNTER — Ambulatory Visit
Admission: RE | Admit: 2023-12-29 | Discharge: 2023-12-29 | Discharge status: Home / Self Care | Source: Ambulatory Visit | Attending: Family Medicine | Admitting: Family Medicine

## 2023-12-29 DIAGNOSIS — K760 Fatty (change of) liver, not elsewhere classified: Secondary | ICD-10-CM | POA: Diagnosis not present

## 2023-12-29 DIAGNOSIS — R935 Abnormal findings on diagnostic imaging of other abdominal regions, including retroperitoneum: Secondary | ICD-10-CM

## 2024-01-17 DIAGNOSIS — L139 Bullous disorder, unspecified: Secondary | ICD-10-CM | POA: Diagnosis not present

## 2024-01-17 DIAGNOSIS — N182 Chronic kidney disease, stage 2 (mild): Secondary | ICD-10-CM | POA: Diagnosis not present

## 2024-01-17 DIAGNOSIS — I129 Hypertensive chronic kidney disease with stage 1 through stage 4 chronic kidney disease, or unspecified chronic kidney disease: Secondary | ICD-10-CM | POA: Diagnosis not present

## 2024-01-17 DIAGNOSIS — E1122 Type 2 diabetes mellitus with diabetic chronic kidney disease: Secondary | ICD-10-CM | POA: Diagnosis not present

## 2024-01-25 ENCOUNTER — Ambulatory Visit (HOSPITAL_BASED_OUTPATIENT_CLINIC_OR_DEPARTMENT_OTHER): Admitting: General Surgery

## 2024-02-09 DIAGNOSIS — L120 Bullous pemphigoid: Secondary | ICD-10-CM | POA: Diagnosis not present

## 2024-03-08 ENCOUNTER — Encounter: Payer: Self-pay | Admitting: Acute Care

## 2024-03-21 ENCOUNTER — Ambulatory Visit
Admission: RE | Admit: 2024-03-21 | Discharge: 2024-03-21 | Disposition: A | Discharge status: Home / Self Care | Source: Ambulatory Visit | Attending: Acute Care | Admitting: Acute Care

## 2024-03-21 DIAGNOSIS — R911 Solitary pulmonary nodule: Secondary | ICD-10-CM

## 2024-04-03 DIAGNOSIS — F17210 Nicotine dependence, cigarettes, uncomplicated: Secondary | ICD-10-CM | POA: Diagnosis not present

## 2024-04-03 DIAGNOSIS — E782 Mixed hyperlipidemia: Secondary | ICD-10-CM | POA: Diagnosis not present

## 2024-04-03 DIAGNOSIS — E7800 Pure hypercholesterolemia, unspecified: Secondary | ICD-10-CM | POA: Diagnosis not present

## 2024-04-03 DIAGNOSIS — E1121 Type 2 diabetes mellitus with diabetic nephropathy: Secondary | ICD-10-CM | POA: Diagnosis not present

## 2024-04-03 DIAGNOSIS — E1169 Type 2 diabetes mellitus with other specified complication: Secondary | ICD-10-CM | POA: Diagnosis not present

## 2024-04-03 DIAGNOSIS — E559 Vitamin D deficiency, unspecified: Secondary | ICD-10-CM | POA: Diagnosis not present

## 2024-04-03 DIAGNOSIS — I10 Essential (primary) hypertension: Secondary | ICD-10-CM | POA: Diagnosis not present

## 2024-04-05 DIAGNOSIS — E782 Mixed hyperlipidemia: Secondary | ICD-10-CM | POA: Diagnosis not present

## 2024-04-05 DIAGNOSIS — I10 Essential (primary) hypertension: Secondary | ICD-10-CM | POA: Diagnosis not present

## 2024-04-05 DIAGNOSIS — Z23 Encounter for immunization: Secondary | ICD-10-CM | POA: Diagnosis not present

## 2024-04-05 DIAGNOSIS — Z0000 Encounter for general adult medical examination without abnormal findings: Secondary | ICD-10-CM | POA: Diagnosis not present

## 2024-04-06 ENCOUNTER — Telehealth: Payer: Self-pay | Admitting: Acute Care

## 2024-04-06 NOTE — Telephone Encounter (Signed)
 Received call report on pt's LDCT. Patient had previous 4B in 11/2023 and PET in 12/2023 with no abnormal uptake. Concerning nodule has now grown. Impression is below:   IMPRESSION: 1. Lung-RADS 4B, suspicious. Additional imaging evaluation or consultation with Pulmonology or Thoracic Surgery recommended. The lingular nodule has enlarged since 11/04/2023, and is suspicious for primary bronchogenic carcinoma. Consider multidisciplinary thoracic oncology consultation for eventual sampling. 2. Aortic atherosclerosis (ICD10-I70.0), coronary artery atherosclerosis and emphysema (ICD10-J43.9). 3. Hepatic morphology, suspicious for cirrhosis, as before. 4. These results will be called to the ordering clinician or representative by the Radiologist Assistant, and communication documented in the PACS or [employer].     Electronically Signed   By: Rockey Kilts M.D.   On: 04/06/2024 13:59

## 2024-04-10 NOTE — Telephone Encounter (Signed)
 Provider will review results at tomorrow's follow up appt.

## 2024-04-11 ENCOUNTER — Ambulatory Visit (INDEPENDENT_AMBULATORY_CARE_PROVIDER_SITE_OTHER): Admitting: Acute Care

## 2024-04-11 ENCOUNTER — Telehealth: Payer: Self-pay | Admitting: Acute Care

## 2024-04-11 ENCOUNTER — Encounter: Payer: Self-pay | Admitting: Acute Care

## 2024-04-11 VITALS — BP 144/81 | HR 94 | Temp 97.4°F | Ht 64.0 in | Wt 180.2 lb

## 2024-04-11 DIAGNOSIS — F17210 Nicotine dependence, cigarettes, uncomplicated: Secondary | ICD-10-CM

## 2024-04-11 DIAGNOSIS — R911 Solitary pulmonary nodule: Secondary | ICD-10-CM | POA: Diagnosis not present

## 2024-04-11 DIAGNOSIS — R9389 Abnormal findings on diagnostic imaging of other specified body structures: Secondary | ICD-10-CM | POA: Diagnosis not present

## 2024-04-11 DIAGNOSIS — R918 Other nonspecific abnormal finding of lung field: Secondary | ICD-10-CM | POA: Insufficient documentation

## 2024-04-11 DIAGNOSIS — Z720 Tobacco use: Secondary | ICD-10-CM

## 2024-04-11 NOTE — Progress Notes (Signed)
 History of Present Illness [name] is a 73 y.o. female current every day smoker followed through the lung cancer screening program, here today for abnormal chest imaging. She will be followed by Dr. Shelah.    04/11/2024 Discussed the use of AI scribe software for clinical note transcription with the patient, who gave verbal consent to proceed.  History of Present Illness Pt. Presents for lung nodule consult. She is followed through the lung cancer screening program for a lingular nodule that has increased in size in a 3 month period of time, concerning for malignancy.      A PET scan in May 2025 appeared normal, but a follow-up CT Chest scan three months later showed an increase in the size of the left lingular lung nodule from 11 millimeters to 14.8 millimeters. We discussed the option of a short term follow up scan vs biopsy now. She prefers biopsy now. She denies any hemoptysis. She has had a 3 pound weight loss since May.  She has a history of breast biopsy 17 years ago, which was benign . She has undergone general anesthesia for knee surgeries and a breast biopsy without adverse reactions. She has diabetes and takes daily medication daily. She has a history of an allergic reaction to nicotine patches, resulting in hives.  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  No sleep apnea, and she is not on any blood thinners or aspirin .  We have made plans for navigational bronchoscopy with biopsies 04/23/2024. We have discussed all risks and benefits.She is not on blood thinners. She will follow up with me 1 weeks after the scan to discuss results.      Test Results: CT Chest 03/21/2024 Lungs/Pleura: No pleural fluid. Mild to moderate centrilobular emphysema. Left lower lobe mucoid impaction medially.   Interval enlargement of the lingular  nodule including on 154/4. 14.8 mm today versus 11.0 mm on the prior.   Other pulmonary nodules are similar at maximally 3.8 mm.  Lung-RADS 4B, suspicious. Additional imaging evaluation or consultation with Pulmonology or Thoracic Surgery recommended. The lingular nodule has enlarged since 11/04/2023, and is suspicious for primary bronchogenic carcinoma. Consider multidisciplinary thoracic oncology consultation for eventual sampling. 2. Aortic atherosclerosis (ICD10-I70.0), coronary artery atherosclerosis and emphysema (ICD10-J43.9). 3. Hepatic morphology, suspicious for cirrhosis, as before. 4. These results will be called to the ordering clinician or representative by the Radiologist Assistant, and communication documented in the PACS or [employer].    PET scan 12/20/2023 CHEST: No abnormal uptake seen above blood pool in the axillary region, hilum or mediastinum. The spiculated nodule in the posterior aspect of the lingula on the previous examination which measured 11 mm, today appears less well defined. Maximal dimension 9 mm on image 66 of today's CT scan. No abnormal uptake. No other areas of abnormal lung uptake. Several other tiny lung nodule on the prior are not as well defined on this PET-CT examination. This could be technical. There is a right-sided air cyst in the lower lobe.  Lingular spiculated nodule appears slightly smaller today compared to the prior CT. There is also no specific abnormal radiotracer uptake at this time. Recommend follow-up CT imaging in 3 months.    11/04/2023 CT Chest  No pleural fluid. Mild centrilobular emphysema. Right greater than left lower lobe bronchial wall thickening with mild medial right lower lobe mucoid impaction.  A spiculated lingular nodule of 11 mm is new, including on 168/9. Other pulmonary nodules of maximally 4.1 mm are unchanged.  Lung-RADS 4B, suspicious. Additional imaging evaluation or consultation with  Pulmonology or Thoracic Surgery recommended. New lingular nodule of 11 mm, highly suspicious for primary bronchogenic carcinoma. Consider PET to direct tissue sampling.       Latest Ref Rng & Units 06/16/2022    1:30 PM 07/31/2021   10:58 AM 04/14/2020    5:16 PM  CBC  WBC 4.0 - 10.5 K/uL 9.6  11.5  11.9   Hemoglobin 12.0 - 15.0 g/dL 86.0  86.4  85.0   Hematocrit 36.0 - 46.0 % 42.3  41.9  43.7   Platelets 150 - 400 K/uL 207  252  274        Latest Ref Rng & Units 06/16/2022    1:30 PM 07/31/2021   10:58 AM 04/14/2020    5:16 PM  BMP  Glucose 70 - 99 mg/dL 784  895  844   BUN 8 - 23 mg/dL 11  13  16    Creatinine 0.44 - 1.00 mg/dL 9.38  9.23  9.19   Sodium 135 - 145 mmol/L 139  137  138   Potassium 3.5 - 5.1 mmol/L 4.0  4.1  3.9   Chloride 98 - 111 mmol/L 101  102  102   CO2 22 - 32 mmol/L 29  28  26    Calcium  8.9 - 10.3 mg/dL 9.3  8.9  9.3     BNP    Component Value Date/Time   BNP 20.9 10/24/2020 0000    ProBNP No results found for: PROBNP  PFT    Component Value Date/Time   FEV1PRE 2.00 10/30/2020 1333   FEV1POST 1.93 10/30/2020 1333   FVCPRE 2.55 10/30/2020 1333   FVCPOST 2.51 10/30/2020 1333   TLC 5.17 10/30/2020 1333   DLCOUNC 14.84 10/30/2020 1333   PREFEV1FVCRT 78 10/30/2020 1333   PSTFEV1FVCRT 77 10/30/2020 1333    CT CHEST LCS NODULE F/U LOW DOSE WO CONTRAST Result Date: 04/06/2024 CLINICAL DATA:  Follow-up of lingular nodule. EXAM: CT CHEST WITHOUT CONTRAST FOR LUNG CANCER SCREENING NODULE FOLLOW-UP TECHNIQUE: Multidetector CT imaging of the chest was performed following the standard protocol without IV contrast. RADIATION DOSE REDUCTION: This exam was performed according to the departmental dose-optimization program which includes automated exposure control, adjustment of the mA and/or kV according to patient size and/or use of iterative reconstruction technique. COMPARISON:  Screening CT 11/04/2023.  PET 12/20/2023. FINDINGS: Cardiovascular: Aortic  atherosclerosis. Normal heart size, without pericardial effusion. Three vessel coronary artery calcification. Mediastinum/Nodes: 1.7 cm left-sided thyroid  nodule has been evaluated on 06/03/2022 ultrasound No mediastinal or hilar adenopathy, given limitations of unenhanced CT. Tiny hiatal hernia. Lungs/Pleura: No pleural fluid. Mild to moderate centrilobular emphysema. Left lower lobe mucoid impaction medially. Interval enlargement of the lingular nodule including on 154/4. 14.8 mm today versus 11.0 mm on the prior. Other pulmonary nodules are similar at maximally 3.8 mm. Upper Abdomen: Moderate caudate and lateral segment left liver lobe enlargement. Cholecystectomy. Normal imaged portions of the spleen, adrenal glands. Musculoskeletal: Mild osteopenia.  Thoracic spondylosis. IMPRESSION: 1. Lung-RADS 4B, suspicious. Additional imaging evaluation or consultation with Pulmonology or Thoracic Surgery recommended. The lingular nodule has enlarged since 11/04/2023, and is suspicious for primary bronchogenic carcinoma. Consider multidisciplinary thoracic oncology consultation for eventual sampling. 2. Aortic atherosclerosis (ICD10-I70.0), coronary artery atherosclerosis and emphysema (ICD10-J43.9). 3. Hepatic morphology, suspicious for cirrhosis, as before. 4. These results will  be called to the ordering clinician or representative by the Radiologist Assistant, and communication documented in the PACS or [employer]. Electronically Signed   By: Rockey Kilts M.D.   On: 04/06/2024 13:59     Past medical hx Past Medical History:  Diagnosis Date   Alcohol abuse, in remission    Anxiety    Cancer of sigmoid colon (HCC) 07/2005   T2N0M0   Chronic bronchitis    COPD (chronic obstructive pulmonary disease) (HCC)    Depression    Diabetes mellitus without complication (HCC)    GERD (gastroesophageal reflux disease)    History of thyroid  nodule    monitoring   Hyperlipidemia    Hypertension    Migraines     OA (osteoarthritis)    PONV (postoperative nausea and vomiting)    Reflux      Social History   Tobacco Use   Smoking status: Every Day    Current packs/day: 0.75    Average packs/day: 0.8 packs/day for 54.7 years (41.0 ttl pk-yrs)    Types: Cigarettes, E-cigarettes    Start date: 1971   Smokeless tobacco: Never   Tobacco comments:    0.5 ppd of cigerettes , using e cigarette(very little)  Vaping Use   Vaping status: Some Days   Substances: Nicotine, Flavoring  Substance Use Topics   Alcohol use: No    Comment: recovering  alcoholic   Drug use: No    Ms.Cozby reports that she has been smoking cigarettes and e-cigarettes. She started smoking about 54 years ago. She has a 41 pack-year smoking history. She has never used smokeless tobacco. She reports that she does not drink alcohol and does not use drugs.  Tobacco Cessation: Ready to quit: Not Answered Counseling given: Not Answered Tobacco comments: 0.5 ppd of cigerettes , using e cigarette(very little) Current every day smoker. Counseled x 3-5 minutes on quitting. Discussed using nicotine lozenges.   Past surgical hx, Family hx, Social hx all reviewed.  Current Outpatient Medications on File Prior to Visit  Medication Sig   amitriptyline  (ELAVIL ) 25 MG tablet Take 100 mg by mouth at bedtime.   ARIPiprazole  (ABILIFY ) 20 MG tablet Take 20 mg by mouth at bedtime.   calcium  carbonate (TUMS - DOSED IN MG ELEMENTAL CALCIUM ) 500 MG chewable tablet Chew 2 tablets by mouth 4 (four) times daily as needed for indigestion or heartburn.   Cholecalciferol  1.25 MG (50000 UT) capsule Take 50,000 Units by mouth daily.   gabapentin  (NEURONTIN ) 100 MG capsule Take 100 mg by mouth 2 (two) times daily.   metFORMIN  (GLUCOPHAGE -XR) 500 MG 24 hr tablet Take 1,000 mg by mouth every evening.   mupirocin ointment (BACTROBAN) 2 % 1 Application.   rosuvastatin  (CRESTOR ) 5 MG tablet Take 5 mg by mouth daily.   triamcinolone  cream (KENALOG ) 0.5 %  1 application to affected area Externally Twice a day; Duration: 30 days As needed   valsartan-hydrochlorothiazide (DIOVAN-HCT) 160-12.5 MG tablet Take 1 tablet by mouth daily.   famotidine  (PEPCID ) 20 MG tablet Take 20 mg by mouth in the morning. (Patient not taking: Reported on 04/11/2024)   predniSONE (DELTASONE) 20 MG tablet Take 20 mg by mouth daily with breakfast. (Patient not taking: Reported on 04/11/2024)   No current facility-administered medications on file prior to visit.     Allergies  Allergen Reactions   Benadryl  [Diphenhydramine  Hcl] Nausea And Vomiting and Other (See Comments)    Migraine headaches.   Lansoprazole Nausea And Vomiting and Other (See  Comments)    Migraine headache  Prevacid* due to dye in med    Morphine Sulfate Nausea And Vomiting and Other (See Comments)    Migraine headaches.   Red Dye #40 (Allura Red) Other (See Comments)    Also allergic to [facility] Dye-migraine headaches.   Simvastatin Nausea And Vomiting and Other (See Comments)    Migraine    Other     Brown dye,medicine with brown dye   Codeine Nausea And Vomiting   Penicillins Nausea And Vomiting and Other (See Comments)    INTOLERANCE >  NAUSEA & VOMITING YEAST INFECTION > CHANGE IN NORMAL FLORA  Has patient had a PCN reaction causing immediate rash, facial/tongue/throat swelling, SOB or lightheadedness with hypotension:No Has patient had a PCN reaction causing severe rash involving mucus membranes or skin necrosis: No Has patient had a PCN reaction that required hospitalization No Has patient had a PCN reaction occurring within the last 10 years:No    Shellfish Allergy Other (See Comments)    Congestion   Sulfa Antibiotics Nausea And Vomiting    Review Of Systems:  Constitutional:   No  weight loss, night sweats,  Fevers, chills, fatigue, or  lassitude.  HEENT:   No headaches,  Difficulty swallowing,  Tooth/dental problems, or  Sore throat,                No sneezing, itching, ear  ache, nasal congestion, post nasal drip,   CV:  No chest pain,  Orthopnea, PND, swelling in lower extremities, anasarca, dizziness, palpitations, syncope.   GI  No heartburn, indigestion, abdominal pain, nausea, vomiting, diarrhea, change in bowel habits, loss of appetite, bloody stools.   Resp: No shortness of breath with exertion or at rest.  No excess mucus, no productive cough,  No non-productive cough,  No coughing up of blood.  No change in color of mucus.  No wheezing.  No chest wall deformity  Skin: no rash or lesions.  GU: no dysuria, change in color of urine, no urgency or frequency.  No flank pain, no hematuria   MS:  No joint pain or swelling.  No decreased range of motion.  No back pain.  Psych:  No change in mood or affect. No depression or anxiety.  No memory loss.   Vital Signs BP (!) 144/81   Pulse 94   Temp (!) 97.4 F (36.3 C) (Oral)   Ht 5' 4 (1.626 m)   Wt 180 lb 3.2 oz (81.7 kg)   SpO2 94%   BMI 30.93 kg/m    Physical Exam:  General- No distress,  A&Ox3, pleasant and appropriate ENT: No sinus tenderness, TM clear, pale nasal mucosa, no oral exudate,no post nasal drip, no LAN Cardiac: S1, S2, regular rate and rhythm, no murmur Chest: No wheeze/ rales/ dullness; no accessory muscle use, no nasal flaring, no sternal retractions Abd.: Soft Non-tender, ND, BS +, Body mass index is 30.93 kg/m.  Ext: No clubbing cyanosis, edema, no obvious deformities Neuro:  normal strength, MAE x 4, A&O x 3 Skin: No rashes, warm and dry, no obvious skin lesions Psych: normal mood and behavior    Assessment & Plan Growing left lingular lung nodule The nodule increased from 11 mm to 14.8 mm in three months, indicating potential malignancy.  Biopsy required for further evaluation. - Schedule biopsy for April 23, 2024, as the third case. - Perform navigational bronchoscopy with biopsy . - Discuss risks: bleeding, infection, pneumothorax, anesthesia reactions. -  Ensure awareness of  general anesthesia and risks. - Instruct to arrange transportation and post-procedure care. - Provide after-visit summary and letter with specifics.   Current every day smoker Tried nicotine patches and gum Tried hypnosis  - Counseled for tobacco abuse . - Plan to try nicotine mints, and work on reduce to quit.   I spent 25 minutes dedicated to the care of this patient on the date of this encounter to include pre-visit review of records, face-to-face time with the patient discussing conditions above, post visit ordering of testing, clinical documentation with the electronic health record, making appropriate referrals as documented, and communicating necessary information to the patient's healthcare team.   Lauraine JULIANNA Lites, NP 04/11/2024  11:38 AM

## 2024-04-11 NOTE — Telephone Encounter (Signed)
 Please schedule the following:  Provider performing procedure: Byrum Diagnosis:  Lung nodule Which side if for nodule / mass?  Lingula Procedure:  Navigational bronchoscopy with biopsies Has patient been spoken to by Provider and given informed consent? Chaney Domino NP on 04/11/2024 Anesthesia:  General Do you need Fluro?  Yes Duration of procedure:  1.5 Date: 04/23/2024 Alternate Date: 04/24/2024  Time: Third case , about 65 ish Location:  [facility] Does patient have OSA?  No DM?  Yes, takes metformin  daily  Or Latex allergy? No Medication Restriction/ Anticoagulate/Antiplatelet:  None Pre-op Labs Ordered:determined by Anesthesia Imaging request:  Recent CT Chest should work   (If, SuperDimension CT Chest, please have STAT courier sent to ENDO)

## 2024-04-11 NOTE — Telephone Encounter (Signed)
 Letter given by the nurse Case# [phone] will send to [facility] to get auth

## 2024-04-11 NOTE — Patient Instructions (Addendum)
 It is good to see you today. We have reviewed the most recent CT chest and it does show an increase in size in that lingular nodule we have been monitoring. In a short period of time it is grown about 3 mm. You and I discussed this and the plan is for a biopsy for tissue diagnosis. I have placed an order for a bronchoscopy with biopsies.  We have discussed the procedure in detail.  We have reviewed the risks and benefits of the procedure. These include bleeding, infection, puncture of the lung, and adverse reaction to anesthesia. You have agreed to proceed with biopsy to evaluate the lingular nodule. Your procedure will be done by Dr. Lamar Chris. You will receive a letter today with date time and information pertaining to the procedure. You will need someone to drive you to the procedure, stay with you during the procedure, and stay with you after the procedure. You will also need someone to stay with you for 24 hours after anesthesia to ensure you have cleared and are doing well. You will follow-up with me 1 week after the procedure to review the results and to ensure you are doing well. Call if you need us  prior to the procedure or if you have any questions at all. Please contact office for sooner follow up if symptoms do not improve or worsen or seek emergency care     You can receive free nicotine replacement therapy (patches, gum, or mints) by calling 1-800-QUIT NOW. Please call so we can get you on the path to becoming a non-smoker. I know it is hard, but you can do this!  Hypnosis for smoking cessation  [employer] [phone]  Acupuncture for smoking cessation  [employer] [phone]

## 2024-04-11 NOTE — H&P (View-Only) (Signed)
 History of Present Illness [name] is a 73 y.o. female current every day smoker followed through the lung cancer screening program, here today for abnormal chest imaging. She will be followed by Dr. Shelah.    04/11/2024 Discussed the use of AI scribe software for clinical note transcription with the patient, who gave verbal consent to proceed.  History of Present Illness Pt. Presents for lung nodule consult. She is followed through the lung cancer screening program for a lingular nodule that has increased in size in a 3 month period of time, concerning for malignancy.      A PET scan in May 2025 appeared normal, but a follow-up CT Chest scan three months later showed an increase in the size of the left lingular lung nodule from 11 millimeters to 14.8 millimeters. We discussed the option of a short term follow up scan vs biopsy now. She prefers biopsy now. She denies any hemoptysis. She has had a 3 pound weight loss since May.  She has a history of breast biopsy 17 years ago, which was benign . She has undergone general anesthesia for knee surgeries and a breast biopsy without adverse reactions. She has diabetes and takes daily medication daily. She has a history of an allergic reaction to nicotine patches, resulting in hives.  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  No sleep apnea, and she is not on any blood thinners or aspirin .  We have made plans for navigational bronchoscopy with biopsies 04/23/2024. We have discussed all risks and benefits.She is not on blood thinners. She will follow up with me 1 weeks after the scan to discuss results.      Test Results: CT Chest 03/21/2024 Lungs/Pleura: No pleural fluid. Mild to moderate centrilobular emphysema. Left lower lobe mucoid impaction medially.   Interval enlargement of the lingular  nodule including on 154/4. 14.8 mm today versus 11.0 mm on the prior.   Other pulmonary nodules are similar at maximally 3.8 mm.  Lung-RADS 4B, suspicious. Additional imaging evaluation or consultation with Pulmonology or Thoracic Surgery recommended. The lingular nodule has enlarged since 11/04/2023, and is suspicious for primary bronchogenic carcinoma. Consider multidisciplinary thoracic oncology consultation for eventual sampling. 2. Aortic atherosclerosis (ICD10-I70.0), coronary artery atherosclerosis and emphysema (ICD10-J43.9). 3. Hepatic morphology, suspicious for cirrhosis, as before. 4. These results will be called to the ordering clinician or representative by the Radiologist Assistant, and communication documented in the PACS or [employer].    PET scan 12/20/2023 CHEST: No abnormal uptake seen above blood pool in the axillary region, hilum or mediastinum. The spiculated nodule in the posterior aspect of the lingula on the previous examination which measured 11 mm, today appears less well defined. Maximal dimension 9 mm on image 66 of today's CT scan. No abnormal uptake. No other areas of abnormal lung uptake. Several other tiny lung nodule on the prior are not as well defined on this PET-CT examination. This could be technical. There is a right-sided air cyst in the lower lobe.  Lingular spiculated nodule appears slightly smaller today compared to the prior CT. There is also no specific abnormal radiotracer uptake at this time. Recommend follow-up CT imaging in 3 months.    11/04/2023 CT Chest  No pleural fluid. Mild centrilobular emphysema. Right greater than left lower lobe bronchial wall thickening with mild medial right lower lobe mucoid impaction.  A spiculated lingular nodule of 11 mm is new, including on 168/9. Other pulmonary nodules of maximally 4.1 mm are unchanged.  Lung-RADS 4B, suspicious. Additional imaging evaluation or consultation with  Pulmonology or Thoracic Surgery recommended. New lingular nodule of 11 mm, highly suspicious for primary bronchogenic carcinoma. Consider PET to direct tissue sampling.       Latest Ref Rng & Units 06/16/2022    1:30 PM 07/31/2021   10:58 AM 04/14/2020    5:16 PM  CBC  WBC 4.0 - 10.5 K/uL 9.6  11.5  11.9   Hemoglobin 12.0 - 15.0 g/dL 86.0  86.4  85.0   Hematocrit 36.0 - 46.0 % 42.3  41.9  43.7   Platelets 150 - 400 K/uL 207  252  274        Latest Ref Rng & Units 06/16/2022    1:30 PM 07/31/2021   10:58 AM 04/14/2020    5:16 PM  BMP  Glucose 70 - 99 mg/dL 784  895  844   BUN 8 - 23 mg/dL 11  13  16    Creatinine 0.44 - 1.00 mg/dL 9.38  9.23  9.19   Sodium 135 - 145 mmol/L 139  137  138   Potassium 3.5 - 5.1 mmol/L 4.0  4.1  3.9   Chloride 98 - 111 mmol/L 101  102  102   CO2 22 - 32 mmol/L 29  28  26    Calcium  8.9 - 10.3 mg/dL 9.3  8.9  9.3     BNP    Component Value Date/Time   BNP 20.9 10/24/2020 0000    ProBNP No results found for: PROBNP  PFT    Component Value Date/Time   FEV1PRE 2.00 10/30/2020 1333   FEV1POST 1.93 10/30/2020 1333   FVCPRE 2.55 10/30/2020 1333   FVCPOST 2.51 10/30/2020 1333   TLC 5.17 10/30/2020 1333   DLCOUNC 14.84 10/30/2020 1333   PREFEV1FVCRT 78 10/30/2020 1333   PSTFEV1FVCRT 77 10/30/2020 1333    CT CHEST LCS NODULE F/U LOW DOSE WO CONTRAST Result Date: 04/06/2024 CLINICAL DATA:  Follow-up of lingular nodule. EXAM: CT CHEST WITHOUT CONTRAST FOR LUNG CANCER SCREENING NODULE FOLLOW-UP TECHNIQUE: Multidetector CT imaging of the chest was performed following the standard protocol without IV contrast. RADIATION DOSE REDUCTION: This exam was performed according to the departmental dose-optimization program which includes automated exposure control, adjustment of the mA and/or kV according to patient size and/or use of iterative reconstruction technique. COMPARISON:  Screening CT 11/04/2023.  PET 12/20/2023. FINDINGS: Cardiovascular: Aortic  atherosclerosis. Normal heart size, without pericardial effusion. Three vessel coronary artery calcification. Mediastinum/Nodes: 1.7 cm left-sided thyroid  nodule has been evaluated on 06/03/2022 ultrasound No mediastinal or hilar adenopathy, given limitations of unenhanced CT. Tiny hiatal hernia. Lungs/Pleura: No pleural fluid. Mild to moderate centrilobular emphysema. Left lower lobe mucoid impaction medially. Interval enlargement of the lingular nodule including on 154/4. 14.8 mm today versus 11.0 mm on the prior. Other pulmonary nodules are similar at maximally 3.8 mm. Upper Abdomen: Moderate caudate and lateral segment left liver lobe enlargement. Cholecystectomy. Normal imaged portions of the spleen, adrenal glands. Musculoskeletal: Mild osteopenia.  Thoracic spondylosis. IMPRESSION: 1. Lung-RADS 4B, suspicious. Additional imaging evaluation or consultation with Pulmonology or Thoracic Surgery recommended. The lingular nodule has enlarged since 11/04/2023, and is suspicious for primary bronchogenic carcinoma. Consider multidisciplinary thoracic oncology consultation for eventual sampling. 2. Aortic atherosclerosis (ICD10-I70.0), coronary artery atherosclerosis and emphysema (ICD10-J43.9). 3. Hepatic morphology, suspicious for cirrhosis, as before. 4. These results will  be called to the ordering clinician or representative by the Radiologist Assistant, and communication documented in the PACS or [employer]. Electronically Signed   By: Rockey Kilts M.D.   On: 04/06/2024 13:59     Past medical hx Past Medical History:  Diagnosis Date   Alcohol abuse, in remission    Anxiety    Cancer of sigmoid colon (HCC) 07/2005   T2N0M0   Chronic bronchitis    COPD (chronic obstructive pulmonary disease) (HCC)    Depression    Diabetes mellitus without complication (HCC)    GERD (gastroesophageal reflux disease)    History of thyroid  nodule    monitoring   Hyperlipidemia    Hypertension    Migraines     OA (osteoarthritis)    PONV (postoperative nausea and vomiting)    Reflux      Social History   Tobacco Use   Smoking status: Every Day    Current packs/day: 0.75    Average packs/day: 0.8 packs/day for 54.7 years (41.0 ttl pk-yrs)    Types: Cigarettes, E-cigarettes    Start date: 1971   Smokeless tobacco: Never   Tobacco comments:    0.5 ppd of cigerettes , using e cigarette(very little)  Vaping Use   Vaping status: Some Days   Substances: Nicotine, Flavoring  Substance Use Topics   Alcohol use: No    Comment: recovering  alcoholic   Drug use: No    Ms.Cozby reports that she has been smoking cigarettes and e-cigarettes. She started smoking about 54 years ago. She has a 41 pack-year smoking history. She has never used smokeless tobacco. She reports that she does not drink alcohol and does not use drugs.  Tobacco Cessation: Ready to quit: Not Answered Counseling given: Not Answered Tobacco comments: 0.5 ppd of cigerettes , using e cigarette(very little) Current every day smoker. Counseled x 3-5 minutes on quitting. Discussed using nicotine lozenges.   Past surgical hx, Family hx, Social hx all reviewed.  Current Outpatient Medications on File Prior to Visit  Medication Sig   amitriptyline  (ELAVIL ) 25 MG tablet Take 100 mg by mouth at bedtime.   ARIPiprazole  (ABILIFY ) 20 MG tablet Take 20 mg by mouth at bedtime.   calcium  carbonate (TUMS - DOSED IN MG ELEMENTAL CALCIUM ) 500 MG chewable tablet Chew 2 tablets by mouth 4 (four) times daily as needed for indigestion or heartburn.   Cholecalciferol  1.25 MG (50000 UT) capsule Take 50,000 Units by mouth daily.   gabapentin  (NEURONTIN ) 100 MG capsule Take 100 mg by mouth 2 (two) times daily.   metFORMIN  (GLUCOPHAGE -XR) 500 MG 24 hr tablet Take 1,000 mg by mouth every evening.   mupirocin ointment (BACTROBAN) 2 % 1 Application.   rosuvastatin  (CRESTOR ) 5 MG tablet Take 5 mg by mouth daily.   triamcinolone  cream (KENALOG ) 0.5 %  1 application to affected area Externally Twice a day; Duration: 30 days As needed   valsartan-hydrochlorothiazide (DIOVAN-HCT) 160-12.5 MG tablet Take 1 tablet by mouth daily.   famotidine  (PEPCID ) 20 MG tablet Take 20 mg by mouth in the morning. (Patient not taking: Reported on 04/11/2024)   predniSONE (DELTASONE) 20 MG tablet Take 20 mg by mouth daily with breakfast. (Patient not taking: Reported on 04/11/2024)   No current facility-administered medications on file prior to visit.     Allergies  Allergen Reactions   Benadryl  [Diphenhydramine  Hcl] Nausea And Vomiting and Other (See Comments)    Migraine headaches.   Lansoprazole Nausea And Vomiting and Other (See  Comments)    Migraine headache  Prevacid* due to dye in med    Morphine Sulfate Nausea And Vomiting and Other (See Comments)    Migraine headaches.   Red Dye #40 (Allura Red) Other (See Comments)    Also allergic to [facility] Dye-migraine headaches.   Simvastatin Nausea And Vomiting and Other (See Comments)    Migraine    Other     Brown dye,medicine with brown dye   Codeine Nausea And Vomiting   Penicillins Nausea And Vomiting and Other (See Comments)    INTOLERANCE >  NAUSEA & VOMITING YEAST INFECTION > CHANGE IN NORMAL FLORA  Has patient had a PCN reaction causing immediate rash, facial/tongue/throat swelling, SOB or lightheadedness with hypotension:No Has patient had a PCN reaction causing severe rash involving mucus membranes or skin necrosis: No Has patient had a PCN reaction that required hospitalization No Has patient had a PCN reaction occurring within the last 10 years:No    Shellfish Allergy Other (See Comments)    Congestion   Sulfa Antibiotics Nausea And Vomiting    Review Of Systems:  Constitutional:   No  weight loss, night sweats,  Fevers, chills, fatigue, or  lassitude.  HEENT:   No headaches,  Difficulty swallowing,  Tooth/dental problems, or  Sore throat,                No sneezing, itching, ear  ache, nasal congestion, post nasal drip,   CV:  No chest pain,  Orthopnea, PND, swelling in lower extremities, anasarca, dizziness, palpitations, syncope.   GI  No heartburn, indigestion, abdominal pain, nausea, vomiting, diarrhea, change in bowel habits, loss of appetite, bloody stools.   Resp: No shortness of breath with exertion or at rest.  No excess mucus, no productive cough,  No non-productive cough,  No coughing up of blood.  No change in color of mucus.  No wheezing.  No chest wall deformity  Skin: no rash or lesions.  GU: no dysuria, change in color of urine, no urgency or frequency.  No flank pain, no hematuria   MS:  No joint pain or swelling.  No decreased range of motion.  No back pain.  Psych:  No change in mood or affect. No depression or anxiety.  No memory loss.   Vital Signs BP (!) 144/81   Pulse 94   Temp (!) 97.4 F (36.3 C) (Oral)   Ht 5' 4 (1.626 m)   Wt 180 lb 3.2 oz (81.7 kg)   SpO2 94%   BMI 30.93 kg/m    Physical Exam:  General- No distress,  A&Ox3, pleasant and appropriate ENT: No sinus tenderness, TM clear, pale nasal mucosa, no oral exudate,no post nasal drip, no LAN Cardiac: S1, S2, regular rate and rhythm, no murmur Chest: No wheeze/ rales/ dullness; no accessory muscle use, no nasal flaring, no sternal retractions Abd.: Soft Non-tender, ND, BS +, Body mass index is 30.93 kg/m.  Ext: No clubbing cyanosis, edema, no obvious deformities Neuro:  normal strength, MAE x 4, A&O x 3 Skin: No rashes, warm and dry, no obvious skin lesions Psych: normal mood and behavior    Assessment & Plan Growing left lingular lung nodule The nodule increased from 11 mm to 14.8 mm in three months, indicating potential malignancy.  Biopsy required for further evaluation. - Schedule biopsy for April 23, 2024, as the third case. - Perform navigational bronchoscopy with biopsy . - Discuss risks: bleeding, infection, pneumothorax, anesthesia reactions. -  Ensure awareness of  general anesthesia and risks. - Instruct to arrange transportation and post-procedure care. - Provide after-visit summary and letter with specifics.   Current every day smoker Tried nicotine patches and gum Tried hypnosis  - Counseled for tobacco abuse . - Plan to try nicotine mints, and work on reduce to quit.   I spent 25 minutes dedicated to the care of this patient on the date of this encounter to include pre-visit review of records, face-to-face time with the patient discussing conditions above, post visit ordering of testing, clinical documentation with the electronic health record, making appropriate referrals as documented, and communicating necessary information to the patient's healthcare team.   Lauraine JULIANNA Lites, NP 04/11/2024  11:38 AM

## 2024-04-12 DIAGNOSIS — L120 Bullous pemphigoid: Secondary | ICD-10-CM | POA: Diagnosis not present

## 2024-04-13 DIAGNOSIS — Z1231 Encounter for screening mammogram for malignant neoplasm of breast: Secondary | ICD-10-CM | POA: Diagnosis not present

## 2024-04-16 DIAGNOSIS — F17210 Nicotine dependence, cigarettes, uncomplicated: Secondary | ICD-10-CM | POA: Diagnosis not present

## 2024-04-16 DIAGNOSIS — E1169 Type 2 diabetes mellitus with other specified complication: Secondary | ICD-10-CM | POA: Diagnosis not present

## 2024-04-16 DIAGNOSIS — E782 Mixed hyperlipidemia: Secondary | ICD-10-CM | POA: Diagnosis not present

## 2024-04-16 DIAGNOSIS — I10 Essential (primary) hypertension: Secondary | ICD-10-CM | POA: Diagnosis not present

## 2024-04-17 NOTE — Telephone Encounter (Signed)
 Copied from CRM #[id number]. Topic: Clinical - Prescription Issue >> Apr 17, 2024  2:04 PM Devaughn RAMAN wrote: Patient called and stated she has a procedure in a week for a biopsy with Dr.Byrum and was advised she needs to stop medication 1 week in advance but she is not sure which medication she needs to stop and was calling to inquire, patient stated she has to stop the medication on today because her biopsy procedure is schedule for September 22nd, contacted [facility] spoke with Thersia attempted to get someone to speak with the patient, advised to place CRM and a follow up call would be made to the patient regarding the medication on today, advised patient was thankful and verbalized understanding.  Called and spoke to patient informed her no meds are needed to be stopped advised understanding.NFN

## 2024-04-20 ENCOUNTER — Other Ambulatory Visit: Payer: Self-pay

## 2024-04-20 ENCOUNTER — Encounter (HOSPITAL_COMMUNITY): Payer: Self-pay | Admitting: Emergency Medicine

## 2024-04-20 NOTE — Progress Notes (Signed)
 PCP - Anthony GORMAN Hint, FNP Cardiologist - Darryle Ned O'Neal, MD  Chest CT - 03/03/24 EKG - 08/09/23 Stress Test - 04/16/11 ECHO - 11/26/20  Checks Blood Sugar Does not check  Anesthesia review: N  Patient verbally denies any shortness of breath, fever, cough and chest pain during phone call   -------------  SDW INSTRUCTIONS given:  Your procedure is scheduled on Monday Sept 22nd.  Report to [facility] Main Entrance A at 0830 A.M., and check in at the Admitting office.  Call this number if you have problems the morning of surgery:  [phone]   Remember:  Do not eat or drink after midnight the night before your surgery   Take these medicines the morning of surgery with A SIP OF WATER   gabapentin  (NEURONTIN )  rosuvastatin  (CRESTOR )  ARIPiprazole  (ABILIFY )  famotidine  (PEPCID )   **DO NOT** Take your metformin  on the day of surgery  ** PLEASE check your blood sugar the morning of your surgery when you wake up and every 2 hours until you get to the Short Stay unit.  If your blood sugar is less than 70 mg/dL, you will need to treat for low blood sugar: Do not take insulin. Treat a low blood sugar (less than 70 mg/dL) with  cup of clear juice (cranberry or apple), 4 glucose tablets, OR glucose gel. Recheck blood sugar in 15 minutes after treatment (to make sure it is greater than 70 mg/dL). If your blood sugar is not greater than 70 mg/dL on recheck, call [phone] for further instructions.    As of today, STOP taking any Aspirin  (unless otherwise instructed by your surgeon) Aleve, Naproxen, Ibuprofen , Motrin , Advil , Goody's, BC's, all herbal medications, fish oil, and all vitamins.                      Do not wear jewelry, make up, or nail polish            Do not wear lotions, powders, perfumes/colognes, or deodorant.            Do not shave 48 hours prior to surgery.  Men may shave face and neck.            Do not bring valuables to the hospital.            [facility] is not responsible for any belongings or valuables.  Do NOT Smoke (Tobacco/Vaping) 24 hours prior to your procedure If you use a CPAP at night, you may bring all equipment for your overnight stay.   Contacts, glasses, dentures or bridgework may not be worn into surgery.      For patients admitted to the hospital, discharge time will be determined by your treatment team.   Patients discharged the day of surgery will not be allowed to drive home, and someone needs to stay with them for 24 hours.    Special instructions:   [place]- Preparing For Surgery  Before surgery, you can play an important role. Because skin is not sterile, your skin needs to be as free of germs as possible. You can reduce the number of germs on your skin by washing with CHG (chlorahexidine gluconate) Soap before surgery.  CHG is an antiseptic cleaner which kills germs and bonds with the skin to continue killing germs even after washing.    Oral Hygiene is also important to reduce your risk of infection.  Remember - BRUSH YOUR TEETH THE MORNING OF SURGERY WITH YOUR REGULAR  TOOTHPASTE  Please do not use if you have an allergy to CHG or antibacterial soaps. If your skin becomes reddened/irritated stop using the CHG.  Do not shave (including legs and underarms) for at least 48 hours prior to first CHG shower. It is OK to shave your face.  Please follow these instructions carefully.   Shower the NIGHT BEFORE SURGERY and the MORNING OF SURGERY with DIAL Soap.   Pat yourself dry with a CLEAN TOWEL.  Wear CLEAN PAJAMAS to bed the night before surgery  Place CLEAN SHEETS on your bed the night of your first shower and DO NOT SLEEP WITH PETS.   Day of Surgery: Please shower morning of surgery  Wear Clean/Comfortable clothing the morning of surgery Do not apply any deodorants/lotions.   Remember to brush your teeth WITH YOUR REGULAR TOOTHPASTE.   Questions were answered. Patient verbalized understanding of  instructions.

## 2024-04-23 ENCOUNTER — Encounter (HOSPITAL_COMMUNITY)
Admission: RE | Disposition: A | Discharge status: Home / Self Care | Payer: Self-pay | Source: Home / Self Care | Attending: Emergency Medicine

## 2024-04-23 ENCOUNTER — Encounter (HOSPITAL_COMMUNITY): Payer: Self-pay | Admitting: Emergency Medicine

## 2024-04-23 ENCOUNTER — Ambulatory Visit (HOSPITAL_COMMUNITY)

## 2024-04-23 ENCOUNTER — Ambulatory Visit (HOSPITAL_COMMUNITY)
Admission: RE | Admit: 2024-04-23 | Discharge: 2024-04-23 | Disposition: A | Discharge status: Home / Self Care | Attending: Emergency Medicine | Admitting: Emergency Medicine

## 2024-04-23 ENCOUNTER — Other Ambulatory Visit: Payer: Self-pay

## 2024-04-23 ENCOUNTER — Ambulatory Visit (HOSPITAL_BASED_OUTPATIENT_CLINIC_OR_DEPARTMENT_OTHER)

## 2024-04-23 DIAGNOSIS — R911 Solitary pulmonary nodule: Secondary | ICD-10-CM

## 2024-04-23 DIAGNOSIS — F17210 Nicotine dependence, cigarettes, uncomplicated: Secondary | ICD-10-CM

## 2024-04-23 DIAGNOSIS — C3412 Malignant neoplasm of upper lobe, left bronchus or lung: Secondary | ICD-10-CM | POA: Insufficient documentation

## 2024-04-23 DIAGNOSIS — J449 Chronic obstructive pulmonary disease, unspecified: Secondary | ICD-10-CM | POA: Insufficient documentation

## 2024-04-23 DIAGNOSIS — E119 Type 2 diabetes mellitus without complications: Secondary | ICD-10-CM | POA: Insufficient documentation

## 2024-04-23 DIAGNOSIS — I10 Essential (primary) hypertension: Secondary | ICD-10-CM | POA: Insufficient documentation

## 2024-04-23 DIAGNOSIS — F32A Depression, unspecified: Secondary | ICD-10-CM | POA: Insufficient documentation

## 2024-04-23 DIAGNOSIS — R918 Other nonspecific abnormal finding of lung field: Secondary | ICD-10-CM | POA: Diagnosis present

## 2024-04-23 DIAGNOSIS — Z7984 Long term (current) use of oral hypoglycemic drugs: Secondary | ICD-10-CM | POA: Insufficient documentation

## 2024-04-23 DIAGNOSIS — F419 Anxiety disorder, unspecified: Secondary | ICD-10-CM | POA: Insufficient documentation

## 2024-04-23 DIAGNOSIS — K219 Gastro-esophageal reflux disease without esophagitis: Secondary | ICD-10-CM | POA: Diagnosis not present

## 2024-04-23 DIAGNOSIS — R0989 Other specified symptoms and signs involving the circulatory and respiratory systems: Secondary | ICD-10-CM | POA: Diagnosis not present

## 2024-04-23 HISTORY — PX: VIDEO BRONCHOSCOPY WITH ENDOBRONCHIAL NAVIGATION: SHX6175

## 2024-04-23 LAB — SURGICAL PCR SCREEN
MRSA, PCR: NEGATIVE
Staphylococcus aureus: NEGATIVE

## 2024-04-23 LAB — GLUCOSE, CAPILLARY
Glucose-Capillary: 187 mg/dL — ABNORMAL HIGH (ref 70–99)
Glucose-Capillary: 105 mg/dL — ABNORMAL HIGH (ref 70–99)

## 2024-04-23 SURGERY — VIDEO BRONCHOSCOPY WITH ENDOBRONCHIAL NAVIGATION
Anesthesia: General

## 2024-04-23 MED ORDER — ONDANSETRON HCL 4 MG/2ML IJ SOLN
INTRAMUSCULAR | Status: DC | PRN
Start: 2024-04-23 — End: 2024-04-23
  Administered 2024-04-23: 4 mg via INTRAVENOUS

## 2024-04-23 MED ORDER — SUCCINYLCHOLINE CHLORIDE 200 MG/10ML IV SOSY
PREFILLED_SYRINGE | INTRAVENOUS | Status: DC | PRN
  Administered 2024-04-23: 120 mg via INTRAVENOUS

## 2024-04-23 MED ORDER — INSULIN ASPART 100 UNIT/ML IJ SOLN
0.0000 [IU] | INTRAMUSCULAR | Status: DC | PRN
  Administered 2024-04-23: 2 [IU] via SUBCUTANEOUS
  Filled 2024-04-23: qty 1

## 2024-04-23 MED ORDER — AMISULPRIDE (ANTIEMETIC) 5 MG/2ML IV SOLN: 10.0000 mg | Freq: Once | INTRAVENOUS | Status: DC | PRN

## 2024-04-23 MED ORDER — LACTATED RINGERS IV SOLN: INTRAVENOUS | Status: DC

## 2024-04-23 MED ORDER — EPHEDRINE SULFATE-NACL 50-0.9 MG/10ML-% IV SOSY
PREFILLED_SYRINGE | INTRAVENOUS | Status: DC | PRN
  Administered 2024-04-23 (×2): 7.5 mg via INTRAVENOUS

## 2024-04-23 MED ORDER — SUGAMMADEX SODIUM 200 MG/2ML IV SOLN
INTRAVENOUS | Status: DC | PRN
  Administered 2024-04-23: 200 mg via INTRAVENOUS

## 2024-04-23 MED ORDER — PROPOFOL 500 MG/50ML IV EMUL
INTRAVENOUS | Status: DC | PRN
  Administered 2024-04-23: 20 mg via INTRAVENOUS
  Administered 2024-04-23: 100 ug/kg/min via INTRAVENOUS

## 2024-04-23 MED ORDER — IPRATROPIUM-ALBUTEROL 0.5-2.5 (3) MG/3ML IN SOLN
RESPIRATORY_TRACT | Status: AC
  Filled 2024-04-23: qty 3

## 2024-04-23 MED ORDER — FENTANYL CITRATE (PF) 100 MCG/2ML IJ SOLN
INTRAMUSCULAR | Status: AC
  Filled 2024-04-23: qty 2

## 2024-04-23 MED ORDER — PROPOFOL 10 MG/ML IV BOLUS
INTRAVENOUS | Status: DC | PRN
  Administered 2024-04-23: 70 mg via INTRAVENOUS
  Administered 2024-04-23: 130 mg via INTRAVENOUS

## 2024-04-23 MED ORDER — HYDROMORPHONE HCL 1 MG/ML IJ SOLN: 0.2500 mg | INTRAMUSCULAR | Status: DC | PRN

## 2024-04-23 MED ORDER — LIDOCAINE 2% (20 MG/ML) 5 ML SYRINGE
INTRAMUSCULAR | Status: DC | PRN
  Administered 2024-04-23: 60 mg via INTRAVENOUS

## 2024-04-23 MED ORDER — FENTANYL CITRATE (PF) 250 MCG/5ML IJ SOLN
INTRAMUSCULAR | Status: DC | PRN
  Administered 2024-04-23: 100 ug via INTRAVENOUS

## 2024-04-23 MED ORDER — ROCURONIUM BROMIDE 10 MG/ML (PF) SYRINGE
PREFILLED_SYRINGE | INTRAVENOUS | Status: DC | PRN
  Administered 2024-04-23: 15 mg via INTRAVENOUS
  Administered 2024-04-23: 50 mg via INTRAVENOUS

## 2024-04-23 MED ORDER — CHLORHEXIDINE GLUCONATE 0.12 % MT SOLN
OROMUCOSAL | Status: AC
  Filled 2024-04-23: qty 15

## 2024-04-23 MED ORDER — SODIUM CHLORIDE 0.9 % IV SOLN: INTRAVENOUS | Status: DC | PRN

## 2024-04-23 MED ORDER — DEXAMETHASONE SODIUM PHOSPHATE 10 MG/ML IJ SOLN
INTRAMUSCULAR | Status: DC | PRN
  Administered 2024-04-23: 10 mg via INTRAVENOUS

## 2024-04-23 MED ORDER — PHENYLEPHRINE HCL-NACL 20-0.9 MG/250ML-% IV SOLN
INTRAVENOUS | Status: DC | PRN
  Administered 2024-04-23: 40 ug/min via INTRAVENOUS

## 2024-04-23 MED ORDER — IPRATROPIUM-ALBUTEROL 0.5-2.5 (3) MG/3ML IN SOLN
3.0000 mL | Freq: Once | RESPIRATORY_TRACT | Status: AC
  Administered 2024-04-23: 3 mL via RESPIRATORY_TRACT

## 2024-04-23 MED ORDER — SODIUM CHLORIDE 0.9 % IV SOLN
12.5000 mg | INTRAVENOUS | Status: DC | PRN
  Filled 2024-04-23: qty 0.5

## 2024-04-23 SURGICAL SUPPLY — 37 items
ADAPTER BRONCHOSCOPE OLYMPUS (ADAPTER) ×2 IMPLANT
ADAPTER VALVE BIOPSY EBUS (MISCELLANEOUS) IMPLANT
BAG COUNTER SPONGE SURGICOUNT (BAG) ×2 IMPLANT
BRUSH CYTOL CELLEBRITY 1.5X140 (MISCELLANEOUS) ×2 IMPLANT
BRUSH SUPERTRAX BIOPSY (INSTRUMENTS) IMPLANT
BRUSH SUPERTRAX NDL-TIP CYTO (INSTRUMENTS) ×2 IMPLANT
CANISTER SUCTION 3000ML PPV (SUCTIONS) ×2 IMPLANT
CNTNR URN SCR LID CUP LEK RST (MISCELLANEOUS) ×2 IMPLANT
COVER BACK TABLE 60X90IN (DRAPES) ×2 IMPLANT
FILTER STRAW FLUID ASPIR (MISCELLANEOUS) IMPLANT
FORCEPS BIOP 1.5 SINGLE USE (MISCELLANEOUS) ×2 IMPLANT
FORCEPS BIOP SUPERTRX PREMAR (INSTRUMENTS) ×2 IMPLANT
GAUZE SPONGE 4X4 12PLY STRL (GAUZE/BANDAGES/DRESSINGS) ×2 IMPLANT
GLOVE BIO SURGEON STRL SZ7.5 (GLOVE) ×4 IMPLANT
GOWN STRL REUS W/ TWL LRG LVL3 (GOWN DISPOSABLE) ×4 IMPLANT
KIT CLEAN ENDO COMPLIANCE (KITS) ×2 IMPLANT
KIT LOCATABLE GUIDE (CANNULA) IMPLANT
KIT MARKER FIDUCIAL DELIVERY (KITS) IMPLANT
KIT TURNOVER KIT B (KITS) ×2 IMPLANT
MARKER SKIN DUAL TIP RULER LAB (MISCELLANEOUS) ×2 IMPLANT
NDL SUPERTRX PREMARK BIOPSY (NEEDLE) ×2 IMPLANT
NEEDLE SUPERTRX PREMARK BIOPSY (NEEDLE) ×1 IMPLANT
NS IRRIG 1000ML POUR BTL (IV SOLUTION) ×2 IMPLANT
OIL SILICONE PENTAX (PARTS (SERVICE/REPAIRS)) ×2 IMPLANT
PAD ARMBOARD POSITIONER FOAM (MISCELLANEOUS) ×4 IMPLANT
PATCHES PATIENT (LABEL) ×6 IMPLANT
SYR 20ML ECCENTRIC (SYRINGE) ×2 IMPLANT
SYR 20ML LL LF (SYRINGE) ×2 IMPLANT
SYR 50ML SLIP (SYRINGE) ×2 IMPLANT
Superlock Fiducial Marker IMPLANT
TOWEL GREEN STERILE FF (TOWEL DISPOSABLE) ×2 IMPLANT
TRAP SPECIMEN MUCUS 40CC (MISCELLANEOUS) IMPLANT
TUBE CONNECTING 20X1/4 (TUBING) ×2 IMPLANT
UNDERPAD 30X36 HEAVY ABSORB (UNDERPADS AND DIAPERS) ×2 IMPLANT
VALVE BIOPSY SINGLE USE (MISCELLANEOUS) ×2 IMPLANT
VALVE SUCTION BRONCHIO DISP (MISCELLANEOUS) ×2 IMPLANT
WATER STERILE IRR 1000ML POUR (IV SOLUTION) ×2 IMPLANT

## 2024-04-23 NOTE — Anesthesia Procedure Notes (Addendum)
 Procedure Name: Intubation Date/Time: 04/23/2024 11:38 AM  Performed by: Vergie Ike LABOR, CRNAPre-anesthesia Checklist: Patient identified, Emergency Drugs available, Suction available and Patient being monitored Patient Re-evaluated:Patient Re-evaluated prior to induction Oxygen  Delivery Method: Circle System Utilized Preoxygenation: Pre-oxygenation with 100% oxygen  Induction Type: IV induction Ventilation: Mask ventilation without difficulty Laryngoscope Size: Miller and 3 Grade View: Grade II Tube type: Reinforced Tube size: 8.0 mm Number of attempts: 1 Airway Equipment and Method: Stylet and Oral airway Placement Confirmation: ETT inserted through vocal cords under direct vision, positive ETCO2 and breath sounds checked- equal and bilateral Secured at: 22 cm Tube secured with: Tape Dental Injury: Teeth and Oropharynx as per pre-operative assessment and Injury to lip

## 2024-04-23 NOTE — Op Note (Signed)
 Video Bronchoscopy with Robotic Assisted Bronchoscopic Navigation   Date of Operation: 04/23/2024   Pre-op Diagnosis: Left upper lobe nodule  Post-op Diagnosis: Same  Surgeon: Lamar Chris  Assistants: None  Anesthesia: General endotracheal anesthesia  Operation: Flexible video fiberoptic bronchoscopy with robotic assistance and biopsies.  Estimated Blood Loss: Minimal  Complications: None  Indications and History: [name] is a 73 y.o. female with history of tobacco use.  She has a slowly enlarging left upper lobe pulmonary nodule on serial imaging.  Recommendation made to achieve a tissue diagnosis via robotic assisted navigational bronchoscopy.  The risks, benefits, complications, treatment options and expected outcomes were discussed with the patient.  The possibilities of pneumothorax, pneumonia, reaction to medication, pulmonary aspiration, perforation of a viscus, bleeding, failure to diagnose a condition and creating a complication requiring transfusion or operation were discussed with the patient who freely signed the consent.    Description of Procedure: The patient was seen in the Preoperative Area, was examined and was deemed appropriate to proceed.  The patient was taken to [facility] Endoscopy room 3, identified as [name] and the procedure verified as Flexible Video Fiberoptic Bronchoscopy.  A Time Out was held and the above information confirmed.   Prior to the date of the procedure a high-resolution CT scan of the chest was performed. Utilizing ION software program a virtual tracheobronchial tree was generated to allow the creation of distinct navigation pathways to the patient's parenchymal abnormalities. After being taken to the operating room general anesthesia was initiated and the patient  was orally intubated. The video fiberoptic bronchoscope was introduced via the endotracheal tube and a general inspection was performed which showed normal right and left lung  anatomy. Aspiration of the bilateral mainstems was completed to remove any remaining secretions. Robotic catheter inserted into patient's endotracheal tube.   Target #1 left upper lobe pulmonary nodule: The distinct navigation pathways prepared prior to this procedure were then utilized to navigate to patient's lesion identified on CT scan. The robotic catheter was secured into place and the vision probe was withdrawn.  Lesion location was approximated using fluoroscopy.  Local registration and targeting was performed using [employer] Cios mobile C-arm three-dimensional imaging. Under fluoroscopic guidance transbronchial brushings, transbronchial needle biopsies, and transbronchial cryoprobe biopsies were performed to be sent for cytology and pathology.  Brush-in-lesion and needle-in-lesion were confirmed using Cios mobile C-arm.  A bronchioalveolar lavage was performed in the left upper lobe adjacent to the nodule and sent for microbiology.  Under fluoroscopic guidance a single fiducial marker was placed adjacent to the nodule.   At the end of the procedure a general airway inspection was performed and there was no evidence of active bleeding. The bronchoscope was removed.  The patient tolerated the procedure well. There was no significant blood loss and there were no obvious complications. A post-procedural chest x-ray is pending.  Samples Target #1: 1. Transbronchial brushings from left upper lobe nodule 2. Transbronchial Wang needle biopsies from left upper lobe nodule 3. Transbronchial cryoprobe biopsies from left upper lobe nodule 4. Bronchoalveolar lavage from left upper lobe   Plans:  The patient will be discharged from the PACU to home when recovered from anesthesia and after chest x-ray is reviewed. We will review the cytology, pathology and microbiology results with the patient when they become available. Outpatient followup will be with GORMAN Lites, NP and Dr Chris.    Lamar Chris, MD, PhD 04/23/2024, 12:41 PM [facility] [phone]  or if no answer before 7:00PM call [phone] For any issues after 7:00PM please call eLink [phone]

## 2024-04-23 NOTE — Anesthesia Postprocedure Evaluation (Signed)
 Anesthesia Post Note  Patient: [name]  Procedure(s) Performed: VIDEO BRONCHOSCOPY WITH ENDOBRONCHIAL NAVIGATION     Patient location during evaluation: PACU Anesthesia Type: General Level of consciousness: awake and alert Pain management: pain level controlled Vital Signs Assessment: post-procedure vital signs reviewed and stable Respiratory status: spontaneous breathing, nonlabored ventilation and respiratory function stable Cardiovascular status: blood pressure returned to baseline and stable Postop Assessment: no apparent nausea or vomiting Anesthetic complications: no   No notable events documented.  Last Vitals:  Vitals:   04/23/24 1350 04/23/24 1400  BP: 114/60 101/61  Pulse: 87 88  Resp: (!) 22 (!) 23  Temp:    SpO2: 94% 94%    Last Pain:  Vitals:   04/23/24 1248  TempSrc: Temporal  PainSc: 0-No pain                 Butler Levander Pinal

## 2024-04-23 NOTE — Addendum Note (Signed)
 Addendum  created 04/23/24 1618 by Roslynn Waddell LABOR, CRNA   Flowsheet accepted

## 2024-04-23 NOTE — Op Note (Signed)
 Procedure Note  Patient: [name]  [employer] Cios mobile C-arm was utilized to identify and biopsy left upper lobe pulmonary nodule.  Brush-in-lesion and Needle-in-lesion were confirmed using real-time Cios imaging, and images were uploaded to PACS.       Lamar Chris, MD, PhD 04/23/2024, 12:44 PM [facility] [phone] or if no answer before 7:00PM call [phone] For any issues after 7:00PM please call eLink [phone]

## 2024-04-23 NOTE — Discharge Instructions (Signed)
 Flexible Bronchoscopy, Care After This sheet gives you information about how to care for yourself after your test. Your doctor may also give you more specific instructions. If you have problems or questions, contact your doctor. Follow these instructions at home: Eating and drinking When you are wide awake, your numbness is gone and your cough and gag reflexes have come back, you may: Start eating only soft foods. Slowly drink liquids. Six hours after the test, go back to your normal diet. Driving Do not drive for 24 hours if you were given a medicine to help you relax (sedative). Do not drive or use heavy machinery while taking prescription pain medicine. General instructions Take over-the-counter and prescription medicines only as told by your doctor. Return to your normal activities as told. Ask what activities are safe for you. Do not use any products that have nicotine or tobacco in them. This includes cigarettes and e-cigarettes. If you need help quitting, ask your doctor. Keep all follow-up visits as told by your doctor. This is important. It is very important if you had a tissue sample (biopsy) taken. Get help right away if: You have shortness of breath that gets worse. You get light-headed. You feel like you are going to pass out (faint). You have chest pain. You cough up: More than a little blood. More blood than before. Summary Do not use cigarettes. Do not use e-cigarettes. Seek care in the Emergency Department right away if you have chest pain or shortness of breath. Call or MyChart Message our office for any questions or problems at [phone].    This information is not intended to replace advice given to you by your health care provider. Make sure you discuss any questions you have with your health care provider.   =r

## 2024-04-23 NOTE — Transfer of Care (Signed)
 Immediate Anesthesia Transfer of Care Note  Patient: [name]  Procedure(s) Performed: VIDEO BRONCHOSCOPY WITH ENDOBRONCHIAL NAVIGATION  Patient Location: PACU and Endoscopy Unit  Anesthesia Type:General  Level of Consciousness: awake and alert   Airway & Oxygen  Therapy: Patient Spontanous Breathing and Patient connected to face mask oxygen   Post-op Assessment: Report given to RN and Post -op Vital signs reviewed and stable  Post vital signs: Reviewed and stable  Last Vitals:  Vitals Value Taken Time  BP 153/79 04/23/24 12:48  Temp 36.1 C 04/23/24 12:48  Pulse 82 04/23/24 12:52  Resp 15 04/23/24 12:52  SpO2 96 % 04/23/24 12:52  Vitals shown include unfiled device data.  Last Pain:  Vitals:   04/23/24 1248  TempSrc: Temporal  PainSc: 0-No pain         Complications: No notable events documented.

## 2024-04-23 NOTE — Interval H&P Note (Signed)
 History and Physical Interval Note:  04/23/2024 9:31 AM  [name]  has presented today for surgery, with the diagnosis of lung nodule.  The various methods of treatment have been discussed with the patient and family. After consideration of risks, benefits and other options for treatment, the patient has consented to  Procedure(s) with comments: VIDEO BRONCHOSCOPY WITH ENDOBRONCHIAL NAVIGATION (N/A) - lingular nodule as a surgical intervention.  The patient's history has been reviewed, patient examined, no change in status, stable for surgery.  I have reviewed the patient's chart and labs.  Questions were answered to the patient's satisfaction.     Lamar GORMAN Chris

## 2024-04-23 NOTE — Anesthesia Preprocedure Evaluation (Addendum)
 Anesthesia Evaluation    History of Anesthesia Complications (+) PONV and history of anesthetic complications  Airway Mallampati: III  TM Distance: >3 FB     Dental  (+) Dental Advidsory Given, Teeth Intact Permanent bridge x3 (2 on top, 1 on bottom):   Pulmonary neg shortness of breath, neg sleep apnea, COPD, neg recent URI, Current Smoker and Patient abstained from smoking.   Pulmonary exam normal        Cardiovascular hypertension, (-) angina (-) Past MI and (-) Cardiac Stents Normal cardiovascular exam(-) dysrhythmias (-) Valvular Problems/Murmurs     Neuro/Psych  PSYCHIATRIC DISORDERS Anxiety Depression    negative neurological ROS     GI/Hepatic Neg liver ROS,GERD  ,,  Endo/Other  diabetes    Renal/GU negative Renal ROS     Musculoskeletal   Abdominal   Peds  Hematology negative hematology ROS (+)   Anesthesia Other Findings Past Medical History: No date: Alcohol abuse, in remission No date: Anxiety 07/2005: Cancer of sigmoid colon (HCC)     Comment:  [id number] No date: Chronic bronchitis No date: COPD (chronic obstructive pulmonary disease) (HCC) No date: Depression No date: Diabetes mellitus without complication (HCC) No date: GERD (gastroesophageal reflux disease) No date: History of thyroid  nodule     Comment:  monitoring No date: Hyperlipidemia No date: Hypertension No date: Migraines No date: OA (osteoarthritis) No date: PONV (postoperative nausea and vomiting) No date: Reflux   Reproductive/Obstetrics                              Anesthesia Physical Anesthesia Plan  ASA: 3  Anesthesia Plan: General   Post-op Pain Management:    Induction: Intravenous  PONV Risk Score and Plan: 3 and Treatment may vary due to age or medical condition, TIVA, Propofol  infusion and Ondansetron   Airway Management Planned: Oral ETT  Additional Equipment:   Intra-op Plan:    Post-operative Plan: Extubation in OR  Informed Consent:   Plan Discussed with:   Anesthesia Plan Comments:         Anesthesia Quick Evaluation

## 2024-04-25 ENCOUNTER — Encounter (HOSPITAL_COMMUNITY): Payer: Self-pay | Admitting: Emergency Medicine

## 2024-04-25 LAB — CULTURE, BAL-QUANTITATIVE W GRAM STAIN: Culture: NO GROWTH

## 2024-04-25 LAB — CULTURE, RESPIRATORY W GRAM STAIN: Culture: NO GROWTH

## 2024-04-25 LAB — CYTOLOGY - NON PAP

## 2024-04-25 LAB — ACID FAST SMEAR (AFB, MYCOBACTERIA): Acid Fast Smear: NEGATIVE

## 2024-04-27 ENCOUNTER — Telehealth (HOSPITAL_BASED_OUTPATIENT_CLINIC_OR_DEPARTMENT_OTHER): Payer: Self-pay

## 2024-04-27 NOTE — Telephone Encounter (Signed)
 I spoke with the patient and reviewed her transbronchial biopsy results with her.  These show squamous cell lung cancer.  Explained to her that we need to work on a plan for treatment which might include surgical referral.  She has a follow-up visit in our office on 9/30 at which time we can talk about that referral, repeat pulmonary function testing, possibly repeat PET scan since hers was done in May.  I did advise her to be sure to keep that appointment.  FYI to SG

## 2024-04-27 NOTE — Telephone Encounter (Signed)
 Copied from CRM #[id number]. Topic: Clinical - Lab/Test Results >> Apr 27, 2024  9:48 AM Nathanel DEL wrote: Reason for CRM: pt would like to know if Dr Shelah has looked at her Bronchoscopy results yet? Pt was hoping to get results today, as Oct. Is a busy month for her if she needs to change anything around.

## 2024-04-27 NOTE — Progress Notes (Signed)
 History of Present Illness [name] is a 73 y.o. female current every day smoker followed through the lung cancer screening program, for abnormal chest imaging. She has a history of colon cancer. She will be followed by Dr. Shelah.    05/01/2024 Discussed the use of AI scribe software for clinical note transcription with the patient, who gave verbal consent to proceed.  History of Present Illness [name] is a 73 year old female presents for follow up after robotic assisted navigational bronchoscopy with biopsies and  a new diagnosis of squamous cell lung cancer .She states she did well after the procedure. She did have some reflux after the procedure, and some scan bleeding which self resolved. No fever or worsening shortness of breath.She tolerated the anesthesia without issues.   She has been diagnosed with squamous cell lung cancer following a biopsy. She is currently smoking six to seven cigarettes a day, having reduced from half a pack, and is attempting to quit completely. She is concerned about the impact of smoking on potential surgical options.  We will complete staging with an MR Brain and PET scan. We will also repeat PFT's as these were last done in 2022, and she has continued to smoke. Cultures were all negative.  Her past medical history includes a history of colon cancer, which was previously treated successfully with surgery without the need for chemotherapy or radiation. She has a history of colon polyps and undergoes regular monitoring for recurrence. She has undergone multiple surgeries, including bilateral knee replacements and bunion surgery, with good recovery outcomes. She also had back surgery in 2018 following an accident.  Pulmonary function testing was conducted in March 2022 which shows minimal obstruction, Insignificant BD response, and mild diffusion deficit. We will repeat these as she has continued to smoker. She has not had a PET scan since  April 2025. We will repeat this with this new diagnosis.  She resides in [place] and prefers to have her tests conducted at [facility] due to convenience.  She has a social history of smoking and mentions stress related to her commitments from Wednesday through Monday, which she hopes will not interfere with scheduling her tests. She also shares that a close friend is currently undergoing treatment for esophageal cancer, which has been a source of emotional support and concern.     Test Results: Cytology 04/23/2024 A.  LEFT LUNG, UPPER LOBE, NODULE/TARGET 1, BRUSHING:  - Negative for malignancy  - Scant cellularity; benign bronchial cells   B.  LEFT LUNG, UPPER LOBE, NODULE/TARGET 1, FINE NEEDLE ASPIRATION:  - Malignant  - Non-small cell carcinoma compatible with squamous cell carcinoma   Micro 04/23/2024 BAL No Growth Resp. Culture>> No Growth Aerobic/ Anaerobic >> No Growth Fungal >> Negative AFB >> Negative  Per Dr. Shelah I spoke with the patient and reviewed her transbronchial biopsy results with her.  These show squamous cell lung cancer.  Explained to her that we need to work on a plan for treatment which might include surgical referral.  She has a follow-up visit in our office on 9/30 at which time we can talk about that referral, repeat pulmonary function testing, possibly repeat PET scan since hers was done in May.  I did advise her to be sure to keep that appointment.      Latest Ref Rng & Units 06/16/2022    1:30 PM 07/31/2021   10:58 AM 04/14/2020    5:16 PM  CBC  WBC 4.0 - 10.5 K/uL 9.6  11.5  11.9   Hemoglobin 12.0 - 15.0 g/dL 86.0  86.4  85.0   Hematocrit 36.0 - 46.0 % 42.3  41.9  43.7   Platelets 150 - 400 K/uL 207  252  274        Latest Ref Rng & Units 06/16/2022    1:30 PM 07/31/2021   10:58 AM 04/14/2020    5:16 PM  BMP  Glucose 70 - 99 mg/dL 784  895  844   BUN 8 - 23 mg/dL 11  13  16    Creatinine 0.44 - 1.00 mg/dL 9.38  9.23  9.19   Sodium 135 -  145 mmol/L 139  137  138   Potassium 3.5 - 5.1 mmol/L 4.0  4.1  3.9   Chloride 98 - 111 mmol/L 101  102  102   CO2 22 - 32 mmol/L 29  28  26    Calcium  8.9 - 10.3 mg/dL 9.3  8.9  9.3     BNP    Component Value Date/Time   BNP 20.9 10/24/2020 0000    ProBNP No results found for: PROBNP  PFT    Component Value Date/Time   FEV1PRE 2.00 10/30/2020 1333   FEV1POST 1.93 10/30/2020 1333   FVCPRE 2.55 10/30/2020 1333   FVCPOST 2.51 10/30/2020 1333   TLC 5.17 10/30/2020 1333   DLCOUNC 14.84 10/30/2020 1333   PREFEV1FVCRT 78 10/30/2020 1333   PSTFEV1FVCRT 77 10/30/2020 1333    DG Chest Port 1 View Result Date: 04/23/2024 CLINICAL DATA:  Status post bronchoscopy and biopsy. EXAM: PORTABLE CHEST 1 VIEW COMPARISON:  11/16/2016 and CT chest 03/21/2024. FINDINGS: Trachea is midline. Heart size normal. Fiducial marker in lingula, associated with a known nodule. Lungs are low in volume but otherwise clear. No pleural fluid. No pneumothorax. IMPRESSION: 1. No pneumothorax. 2. Placement of a fiducial marker at site of a known lingular nodule. Electronically Signed   By: Newell Eke M.D.   On: 04/23/2024 13:38   DG C-ARM BRONCHOSCOPY Result Date: 04/23/2024 C-ARM BRONCHOSCOPY: Fluoroscopy was utilized by the requesting physician.  No radiographic interpretation.     Past medical hx Past Medical History:  Diagnosis Date   Alcohol abuse, in remission    Anxiety    Cancer of sigmoid colon (HCC) 07/2005   T2N0M0   Chronic bronchitis    COPD (chronic obstructive pulmonary disease) (HCC)    Depression    Diabetes mellitus without complication (HCC)    GERD (gastroesophageal reflux disease)    History of thyroid  nodule    monitoring   Hyperlipidemia    Hypertension    Migraines    OA (osteoarthritis)    PONV (postoperative nausea and vomiting)    Reflux      Social History   Tobacco Use   Smoking status: Every Day    Current packs/day: 0.75    Average packs/day: 0.8  packs/day for 54.7 years (41.1 ttl pk-yrs)    Types: Cigarettes, E-cigarettes    Start date: 1971   Smokeless tobacco: Never   Tobacco comments:    7 cigerettes  a day, using e cigarette(very little)  Vaping Use   Vaping status: Some Days   Substances: Nicotine, Flavoring  Substance Use Topics   Alcohol use: No    Comment: recovering  alcoholic   Drug use: No    Ms.Mastrogiovanni reports that she has been smoking cigarettes and e-cigarettes. She started smoking about 54 years ago.  She has a 41.1 pack-year smoking history. She has never used smokeless tobacco. She reports that she does not drink alcohol and does not use drugs.  Tobacco Cessation: Ready to quit: Not Answered Counseling given: Not Answered Tobacco comments: 7 cigerettes  a day, using e cigarette(very little) Current every day smoker, smoking 6-7 cigarettes a day  Past surgical hx, Family hx, Social hx all reviewed.  Current Outpatient Medications on File Prior to Visit  Medication Sig   amitriptyline  (ELAVIL ) 25 MG tablet Take 100 mg by mouth at bedtime.   ARIPiprazole  (ABILIFY ) 20 MG tablet Take 20 mg by mouth daily.   calcium  carbonate (TUMS - DOSED IN MG ELEMENTAL CALCIUM ) 500 MG chewable tablet Chew 2 tablets by mouth 4 (four) times daily as needed for indigestion or heartburn.   Cholecalciferol  1.25 MG (50000 UT) capsule Take 50,000 Units by mouth daily.   famotidine  (PEPCID ) 20 MG tablet Take 20 mg by mouth in the morning.   gabapentin  (NEURONTIN ) 100 MG capsule Take 100 mg by mouth 2 (two) times daily.   metFORMIN  (GLUCOPHAGE -XR) 500 MG 24 hr tablet Take 1,000 mg by mouth 2 (two) times daily.   mupirocin ointment (BACTROBAN) 2 % 1 Application.   rosuvastatin  (CRESTOR ) 5 MG tablet Take 5 mg by mouth daily.   triamcinolone  cream (KENALOG ) 0.5 % 1 application to affected area Externally Twice a day; Duration: 30 days As needed   valsartan-hydrochlorothiazide (DIOVAN-HCT) 160-12.5 MG tablet Take 1 tablet by mouth  daily.   No current facility-administered medications on file prior to visit.     Allergies  Allergen Reactions   Benadryl  [Diphenhydramine  Hcl] Nausea And Vomiting and Other (See Comments)    Migraine headaches.   Lansoprazole Nausea And Vomiting and Other (See Comments)    Migraine headache  Prevacid* due to dye in med    Morphine Sulfate Nausea And Vomiting and Other (See Comments)    Migraine headaches.   Red Dye #40 (Allura Red) Other (See Comments)    Also allergic to [facility] Dye-migraine headaches.   Simvastatin Nausea And Vomiting and Other (See Comments)    Migraine    Cephalexin     Other Reaction(s): severe fatigue   Macrolides And Ketolides Nausea And Vomiting   Other     Brown dye,medicine with brown dye   Codeine Nausea And Vomiting   Penicillins Nausea And Vomiting and Other (See Comments)    INTOLERANCE >  NAUSEA & VOMITING YEAST INFECTION > CHANGE IN NORMAL FLORA  Has patient had a PCN reaction causing immediate rash, facial/tongue/throat swelling, SOB or lightheadedness with hypotension:No Has patient had a PCN reaction causing severe rash involving mucus membranes or skin necrosis: No Has patient had a PCN reaction that required hospitalization No Has patient had a PCN reaction occurring within the last 10 years:No    Shellfish Allergy Other (See Comments)    Congestion   Sulfa Antibiotics Nausea And Vomiting    Review Of Systems:  Constitutional:   No  weight loss, night sweats,  Fevers, chills, fatigue, or  lassitude.  HEENT:   No headaches,  Difficulty swallowing,  Tooth/dental problems, or  Sore throat,                No sneezing, itching, ear ache, nasal congestion, post nasal drip,   CV:  No chest pain,  Orthopnea, PND, swelling in lower extremities, anasarca, dizziness, palpitations, syncope.   GI  No heartburn, indigestion, abdominal pain, nausea, vomiting, diarrhea, change in bowel  habits, loss of appetite, bloody stools.   Resp: + baseline   shortness of breath with exertion less at rest.  + baseline  excess mucus, + productive cough,  No non-productive cough,  No coughing up of blood.  No change in color of mucus.  + baseline  wheezing.  No chest wall deformity  Skin: no rash or lesions.  GU: no dysuria, change in color of urine, no urgency or frequency.  No flank pain, no hematuria   MS:  No joint pain or swelling.  No decreased range of motion.  No back pain.  Psych:  No change in mood or affect. No depression or anxiety.  No memory loss.   Vital Signs BP 120/78   Pulse 83   Temp (!) 97.5 F (36.4 C) (Oral)   Ht 5' 4 (1.626 m)   Wt 177 lb (80.3 kg)   SpO2 94%   BMI 30.38 kg/m    Physical Exam:  General- No distress,  A&Ox3, pleasant ENT: No sinus tenderness, TM clear, pale nasal mucosa, no oral exudate,no post nasal drip, no LAN Cardiac: S1, S2, regular rate and rhythm, no murmur Chest: No wheeze/ rales/ dullness; no accessory muscle use, no nasal flaring, no sternal retractions Abd.: Soft Non-tender, ND, BS +, Body mass index is 30.38 kg/m.  Ext: No clubbing cyanosis, edema, no obvious deformities Neuro:  normal strength, MAE x 4, A&O x 3, appropriate Skin: No rashes, warm and dry, no obvious skin lesions  Psych: normal mood and behavior   Assessment/Plan  Assessment and Plan Assessment & Plan New diagnosis squamous cell carcinoma of the lung Diagnosis confirmed via biopsy.  Non-small cell type in lingual nodules.  Surgical intervention considered based on pulmonary function and smoking status.  MRI needed for metastasis evaluation. - Order PET scan. - Order MRI of the brain. - Order pulmonary function tests. - Refer to thoracic surgery for surgical candidacy evaluation. - Refer to radiation oncology if surgery not feasible.  Obstructive lung disease, minimal Minimal obstructive lung disease noted. Pulmonary function critical for surgical eligibility. - Order repeat pulmonary function tests as  last done 2022.  Tobacco use disorder Continues to smoke 6-7 cigarettes daily. Smoking cessation crucial for surgical outcomes and health. Plan Counseling for 3 minutes to quit smoking.  She is working hard to quit.    AVS 05/01/2024 I am glad you have done well after the procedure. We have reviewed the results of your bronchoscopy with biopsies. Your biopsies were positive for squamous cell lung cancer. This is a non-small cell lung cancer. I have ordered pulmonary function test to assess whether or not you are candidate for surgery. You will get a call to get the schedule. I have ordered a PET scan as well as an MRI of the brain to complete staging. You will get phone calls to get these scheduled. Follow-up with me after the scans if you do not have appointments with thoracic surgery and radiology. I have referred you to both thoracic surgery for evaluation for surgical removal of the lung nodule as well as radiation oncology in the event you are not a candidate for surgery. You will get phone calls to schedule both appointments. Either option is a great option if you are a candidate. You will get great care through [facility] health. Please continue to work on quitting smoking as this may impact your surgical candidacy. You can receive free nicotine replacement therapy (patches, gum, or mints) by calling 1-800-QUIT NOW. Please call  so we can get you on the path to becoming a non-smoker. I know it is hard, but you can do this!  Hypnosis for smoking cessation  [employer] [phone]  Acupuncture for smoking cessation  [facility] [phone]    Call us  if you need us  sooner.  Please contact office for sooner follow up if symptoms do not improve or worsen or seek emergency care     I spent 35 minutes dedicated to the care of this patient on the date of this encounter to include pre-visit review of records, face-to-face time with the patient discussing  conditions above, post visit ordering of testing, clinical documentation with the electronic health record, making appropriate referrals as documented, and communicating necessary information to the patient's healthcare team.   Lauraine JULIANNA Lites, NP 05/01/2024  11:33 AM

## 2024-04-28 LAB — AEROBIC/ANAEROBIC CULTURE W GRAM STAIN (SURGICAL/DEEP WOUND): Culture: NO GROWTH

## 2024-05-01 ENCOUNTER — Encounter: Payer: Self-pay | Admitting: Acute Care

## 2024-05-01 ENCOUNTER — Ambulatory Visit

## 2024-05-01 ENCOUNTER — Ambulatory Visit: Admitting: Acute Care

## 2024-05-01 VITALS — BP 120/78 | HR 83 | Temp 97.5°F | Ht 64.0 in | Wt 177.0 lb

## 2024-05-01 DIAGNOSIS — C3410 Malignant neoplasm of upper lobe, unspecified bronchus or lung: Secondary | ICD-10-CM

## 2024-05-01 DIAGNOSIS — R911 Solitary pulmonary nodule: Secondary | ICD-10-CM | POA: Diagnosis not present

## 2024-05-01 DIAGNOSIS — Z9889 Other specified postprocedural states: Secondary | ICD-10-CM

## 2024-05-01 DIAGNOSIS — R9389 Abnormal findings on diagnostic imaging of other specified body structures: Secondary | ICD-10-CM | POA: Diagnosis not present

## 2024-05-01 DIAGNOSIS — Z720 Tobacco use: Secondary | ICD-10-CM

## 2024-05-01 DIAGNOSIS — C3490 Malignant neoplasm of unspecified part of unspecified bronchus or lung: Secondary | ICD-10-CM

## 2024-05-01 DIAGNOSIS — J449 Chronic obstructive pulmonary disease, unspecified: Secondary | ICD-10-CM

## 2024-05-01 DIAGNOSIS — F17210 Nicotine dependence, cigarettes, uncomplicated: Secondary | ICD-10-CM

## 2024-05-01 LAB — PULMONARY FUNCTION TEST
DL/VA % pred: 76 %
DL/VA: 3.12 ml/min/mmHg/L
DLCO unc % pred: 64 %
DLCO unc: 12.94 ml/min/mmHg
FEF 25-75 Post: 1.4 L/s
FEF 25-75 Pre: 0.81 L/s
FEF2575-%Change-Post: 72 %
FEF2575-%Pred-Post: 76 %
FEF2575-%Pred-Pre: 44 %
FEV1-%Change-Post: 15 %
FEV1-%Pred-Post: 71 %
FEV1-%Pred-Pre: 61 %
FEV1-Post: 1.62 L
FEV1-Pre: 1.4 L
FEV1FVC-%Change-Post: 0 %
FEV1FVC-%Pred-Pre: 91 %
FEV6-%Change-Post: 15 %
FEV6-%Pred-Post: 80 %
FEV6-%Pred-Pre: 69 %
FEV6-Post: 2.31 L
FEV6-Pre: 2.01 L
FEV6FVC-%Change-Post: 0 %
FEV6FVC-%Pred-Post: 103 %
FEV6FVC-%Pred-Pre: 103 %
FVC-%Change-Post: 15 %
FVC-%Pred-Post: 78 %
FVC-%Pred-Pre: 67 %
FVC-Post: 2.36 L
FVC-Pre: 2.04 L
Post FEV1/FVC ratio: 69 %
Post FEV6/FVC ratio: 98 %
Pre FEV1/FVC ratio: 69 %
Pre FEV6/FVC Ratio: 98 %
RV % pred: 106 %
RV: 2.44 L
TLC % pred: 95 %
TLC: 4.95 L

## 2024-05-01 NOTE — Patient Instructions (Signed)
 It is good to see you today. I am glad you have done well after the procedure. We have reviewed the results of your bronchoscopy with biopsies. Your biopsies were positive for squamous cell lung cancer. This is a non-small cell lung cancer. I have ordered pulmonary function test to assess whether or not you are candidate for surgery. You will get a call to get the schedule. I have ordered a PET scan as well as an MRI of the brain to complete staging. You will get phone calls to get these scheduled. Follow-up with me after the scans if you do not have appointments with thoracic surgery and radiology. I have referred you to both thoracic surgery for evaluation for surgical removal of the lung nodule as well as radiation oncology in the event you are not a candidate for surgery. You will get phone calls to schedule both appointments. Either option is a great option if you are a candidate. You will get great care through [facility]. Please continue to work on quitting smoking as this may impact your surgical candidacy. You can receive free nicotine replacement therapy (patches, gum, or mints) by calling 1-800-QUIT NOW. Please call so we can get you on the path to becoming a non-smoker. I know it is hard, but you can do this!  Hypnosis for smoking cessation  [employer] [phone]  Acupuncture for smoking cessation  [facility] [phone]    Call us  if you need us  sooner.  Please contact office for sooner follow up if symptoms do not improve or worsen or seek emergency care

## 2024-05-01 NOTE — Patient Instructions (Signed)
 Full pft performed today

## 2024-05-01 NOTE — Progress Notes (Signed)
 Full pft performed today

## 2024-05-03 ENCOUNTER — Other Ambulatory Visit: Payer: Self-pay

## 2024-05-07 DIAGNOSIS — I10 Essential (primary) hypertension: Secondary | ICD-10-CM | POA: Diagnosis not present

## 2024-05-07 DIAGNOSIS — C3490 Malignant neoplasm of unspecified part of unspecified bronchus or lung: Secondary | ICD-10-CM | POA: Diagnosis not present

## 2024-05-07 DIAGNOSIS — E1121 Type 2 diabetes mellitus with diabetic nephropathy: Secondary | ICD-10-CM | POA: Diagnosis not present

## 2024-05-07 NOTE — Progress Notes (Signed)
 The proposed treatment discussed in conference is for discussion purpose only and is not a binding recommendation.  The patients have not been physically examined, or presented with their treatment options.  Therefore, final treatment plans cannot be decided.

## 2024-05-08 ENCOUNTER — Encounter (HOSPITAL_COMMUNITY)
Admission: RE | Admit: 2024-05-08 | Discharge: 2024-05-08 | Disposition: A | Discharge status: Home / Self Care | Source: Ambulatory Visit | Attending: Acute Care | Admitting: Acute Care

## 2024-05-08 DIAGNOSIS — R9082 White matter disease, unspecified: Secondary | ICD-10-CM | POA: Diagnosis not present

## 2024-05-08 DIAGNOSIS — G936 Cerebral edema: Secondary | ICD-10-CM

## 2024-05-08 DIAGNOSIS — I639 Cerebral infarction, unspecified: Secondary | ICD-10-CM

## 2024-05-08 DIAGNOSIS — C3490 Malignant neoplasm of unspecified part of unspecified bronchus or lung: Secondary | ICD-10-CM | POA: Insufficient documentation

## 2024-05-08 LAB — GLUCOSE, CAPILLARY: Glucose-Capillary: 139 mg/dL — ABNORMAL HIGH (ref 70–99)

## 2024-05-08 MED ORDER — FLUDEOXYGLUCOSE F - 18 (FDG) INJECTION
8.8000 | Freq: Once | INTRAVENOUS | Status: AC
  Administered 2024-05-08: 8.91 via INTRAVENOUS

## 2024-05-08 MED ORDER — GADOBUTROL 1 MMOL/ML IV SOLN
8.0000 mL | Freq: Once | INTRAVENOUS | Status: AC | PRN
  Administered 2024-05-08: 8 mL via INTRAVENOUS

## 2024-05-09 NOTE — Progress Notes (Signed)
Patient's appointment was cancelled.

## 2024-05-10 NOTE — Progress Notes (Incomplete)
 Radiation Oncology         [phone] ________________________________  Name: [name]        MRN: [id number]  Date of Service: 05/11/2024 DOB: 10/21/1950  RR:Yjbzd, Anthony RAMAN, FNP  Shelah Lamar RAMAN, MD     REFERRING PHYSICIAN: Shelah Lamar RAMAN, MD  DIAGNOSIS: Malignant neoplasm of upper lobe, L bronchus or lung; C34.12  HISTORY OF PRESENT ILLNESS: [name] is a 73 y.o. female seen in consultation for radiation therapy.  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   She presented after an April 2025 low dose screening CT demonstrated a LI-RADS 4 lesion, 11 mm in size, in the lingula which was new compared to the year prior. Subsequent PET scan performed in May 2025 demonstrated no abnormal radiotracer uptake. She underwent short interval CT scan in August 2025 which demonstrated growth of the lingular lesion, measuring up to 14.8 mm and consistent with a primary bronchogenic carcinoma. She underwent bronchoscopy with biopsy on 04/23/24 which returned consistent with a NSCLC (squamous cell carcinoma). Repeat PET scan was performed yesterday and demonstrated the progressive lesion to be hypermetabolic. No nodal or distant metastases were identified.   She had updated PFTs completed on 05/01/24. She has an appointment with thoracic surgery next Tuesday.  PREVIOUS RADIATION THERAPY: {EXAM; YES/NO:19492::No}  AUTOIMMUNE DISEASE: {EXAM; YES/NO:19492::No}  MEDICAL DEVICES: {EXAM; YES/NO:19492::No}  PREGNANCY: {EXAM; YES/NO:19492::No}   PAST MEDICAL HISTORY:  Past Medical History:  Diagnosis Date   Alcohol abuse, in remission    Anxiety    Cancer of sigmoid colon (HCC) 07/2005   T2N0M0   Chronic bronchitis    COPD (chronic obstructive pulmonary disease) (HCC)    Depression    Diabetes mellitus  without complication (HCC)    GERD (gastroesophageal reflux disease)    History of thyroid  nodule    monitoring   Hyperlipidemia    Hypertension    Migraines    OA (osteoarthritis)    PONV (postoperative nausea and vomiting)    Reflux        PAST SURGICAL HISTORY: Past Surgical History:  Procedure Laterality Date   ABDOMINAL EXPOSURE N/A 11/24/2016   Procedure: ABDOMINAL EXPOSURE;  Surgeon: Krystal JULIANNA Doing, MD;  Location: [facility] OR;  Service: Vascular;  Laterality: N/A;   ABDOMINAL HYSTERECTOMY  1992   age 19 and appendics   ANTERIOR LUMBAR FUSION Bilateral 11/24/2016   Procedure: LUMBAR 5-SACRUM 1 ANTERIOR LUMBAR INTERBODY FUSION;  Surgeon: Oneil Priestly, MD;  Location: [facility] OR;  Service: Orthopedics;  Laterality: Bilateral;  LUMBAR 5-SACRUM 1 ANTERIOR LUMBAR INTERBODY FUSION    ARTHRODESIS METATARSALPHALANGEAL JOINT (MTPJ) Left 08/15/2023   Procedure: ARTHRODESIS METATARSALPHALANGEAL JOINT (MTPJ);  Surgeon: Tobie Franky SQUIBB, DPM;  Location: [facility] ORS;  Service: Orthopedics/Podiatry;  Laterality: Left;  POPLITEAL/SAPHENOUS BLOCK   bilateral breast surgery      benign tumors removed   CHOLECYSTECTOMY  2007   COLON RESECTION  2006   COLONOSCOPY  2019   HAND SURGERY  2010   repair of incisional hernia  2012   SHOULDER ARTHROSCOPY Left 2009   SHOULDER SURGERY Right 1997   rotator cuff   TONSILLECTOMY     as a child   TOTAL KNEE ARTHROPLASTY Left 08/11/2021   Procedure: TOTAL KNEE ARTHROPLASTY;  Surgeon: Beverley Evalene JONETTA, MD;  Location: [facility];  Service: Orthopedics;  Laterality: Left;   TOTAL KNEE ARTHROPLASTY Right 06/29/2022   Procedure: TOTAL KNEE ARTHROPLASTY;  Surgeon: Beverley Evalene BIRCH, MD;  Location: [facility] ORS;  Service: Orthopedics;  Laterality: Right;   VIDEO BRONCHOSCOPY WITH ENDOBRONCHIAL NAVIGATION N/A 04/23/2024   Procedure: VIDEO BRONCHOSCOPY WITH ENDOBRONCHIAL NAVIGATION;  Surgeon: Shelah Lamar RAMAN, MD;  Location: [facility] END[facility];  Service: Pulmonary;  Laterality: N/A;  lingular  nodule     FAMILY HISTORY:  Family History  Problem Relation Age of Onset   Heart disease Mother        Previous MI at age 80-55   Atrial fibrillation Mother    Stroke Mother    Heart failure Mother    Seizures Father        poss d/t alcohol   Cancer Father 77       Prostate cancer    Diabetes Sister    Arrhythmia Sister    Cancer Maternal Aunt        lung cancer    Cancer Maternal Uncle        kidney ca     SOCIAL HISTORY:  reports that she has been smoking cigarettes and e-cigarettes. She started smoking about 54 years ago. She has a 41.1 pack-year smoking history. She has never used smokeless tobacco. She reports that she does not drink alcohol and does not use drugs.   ALLERGIES: Benadryl  [diphenhydramine  hcl], Lansoprazole, Morphine sulfate, Red dye #40 (allura red), Simvastatin, Cephalexin, Macrolides and ketolides, Other, Codeine, Penicillins, Shellfish allergy, and Sulfa antibiotics   MEDICATIONS:  No current facility-administered medications for this encounter.   Current Outpatient Medications  Medication Sig Dispense Refill   amitriptyline  (ELAVIL ) 25 MG tablet Take 100 mg by mouth at bedtime.     ARIPiprazole  (ABILIFY ) 20 MG tablet Take 20 mg by mouth daily.     calcium  carbonate (TUMS - DOSED IN MG ELEMENTAL CALCIUM ) 500 MG chewable tablet Chew 2 tablets by mouth 4 (four) times daily as needed for indigestion or heartburn.     Cholecalciferol  1.25 MG (50000 UT) capsule Take 50,000 Units by mouth daily.     famotidine  (PEPCID ) 20 MG tablet Take 20 mg by mouth in the morning.     gabapentin  (NEURONTIN ) 100 MG capsule Take 100 mg by mouth 2 (two) times daily.     metFORMIN  (GLUCOPHAGE -XR) 500 MG 24 hr tablet Take 1,000 mg by mouth 2 (two) times daily.     mupirocin ointment (BACTROBAN) 2 % 1 Application.     rosuvastatin  (CRESTOR ) 5 MG tablet Take 5 mg by mouth daily.     triamcinolone  cream (KENALOG ) 0.5 % 1 application to affected area Externally Twice a day;  Duration: 30 days As needed     valsartan-hydrochlorothiazide (DIOVAN-HCT) 160-12.5 MG tablet Take 1 tablet by mouth daily.     Facility-Administered Medications Ordered in Other Encounters  Medication Dose Route Frequency Provider Last Rate Last Admin   albuterol  (PROVENTIL ) (2.5 MG/3ML) 0.083% nebulizer solution  10 mg/hr Nebulization Continuous Keith, Kayla N, PA-C   Stopped at 05/11/24 1323     REVIEW OF SYSTEMS: The patient reports that s***/he is doing well overall and a review of symptoms is otherwise negative.      PHYSICAL EXAM:  Wt Readings from Last 3 Encounters:  05/01/24 177 lb (80.3 kg)  04/23/24 180 lb (81.6 kg)  04/11/24 180 lb 3.2 oz (81.7 kg)   Temp Readings from Last 3 Encounters:  05/11/24 97.6 F (36.4 C) (Oral)  05/01/24 (!) 97.5 F (36.4 C) (Oral)  04/23/24 (!) 97 F (36.1 C) (Temporal)   BP Readings from Last 3 Encounters:  05/11/24 120/64  05/01/24 120/78  04/23/24 101/61   Pulse Readings from Last 3 Encounters:  05/11/24 96  05/01/24 83  04/23/24 88    /10   Physical Exam   ECOG = ***   LABORATORY DATA:  Lab Results  Component Value Date   WBC 11.7 (H) 05/11/2024   HGB 14.6 05/11/2024   HCT 45.6 05/11/2024   MCV 97.6 05/11/2024   PLT 273 05/11/2024   Lab Results  Component Value Date   NA 139 06/16/2022   K 4.0 06/16/2022   CL 101 06/16/2022   CO2 29 06/16/2022   Lab Results  Component Value Date   ALT 16 07/31/2021   AST 19 07/31/2021   ALKPHOS 80 07/31/2021   BILITOT 0.6 07/31/2021      RADIOGRAPHY:   PET/CT 05/10/2024  CLINICAL DATA:  Subsequent treatment strategy for restaging of non-small-cell lung cancer. EXAM: NUCLEAR MEDICINE PET SKULL BASE TO THIGH TECHNIQUE: 8.9 mCi F-18 FDG was injected intravenously. Full-ring PET imaging was performed from the skull base to thigh after the radiotracer. CT data was obtained and used for attenuation correction and anatomic localization. Fasting blood glucose: 139 mg/dl  COMPARISON:  91/79/7974 lung cancer screening CT. PET of 12/20/2023 also reviewed. FINDINGS: Mediastinal blood pool activity: SUV max 3.1 Liver activity: SUV max NA NECK: No areas of abnormal hypermetabolism. Incidental CT findings: Bilateral carotid atherosclerosis. A left-sided 1.6 cm thyroid  nodule has been evaluated on 06/03/2022 ultrasound. No cervical adenopathy. CHEST: No thoracic nodal hypermetabolism. The lingular nodule is hypermetabolic. 1.6 x 1.3 cm and a S.U.V. max of 4.6 cm 36/7. Adjacent biopsy fiducial. Incidental CT findings: Centrilobular emphysema. Aortic and coronary artery calcification. ABDOMEN/PELVIS: No abdominopelvic parenchymal or nodal hypermetabolism. Incidental CT findings: Normal adrenal glands. Hepatic morphology again suspicious for mild cirrhosis. Cholecystectomy. Fat containing ventral abdominal wall laxity. Hysterectomy. SKELETON: No abnormal marrow activity. Incidental CT findings: Lumbosacral spine fixation. IMPRESSION: 1. Hypermetabolic 1.6 cm lingular nodule, without thoracic nodal or distant metastasis. T1b, N0, M0. Stage 1A. 2. Incidental findings, including: Aortic atherosclerosis (ICD10-I70.0), coronary artery atherosclerosis and emphysema (ICD10-J43.9). Probable mild cirrhosis. Electronically Signed   By: Rockey Kilts M.D.   On: 05/10/2024 15:08     MR BRAIN W WO 05/09/2024  CLINICAL DATA:  Lung cancer EXAM: MRI HEAD WITHOUT AND WITH CONTRAST TECHNIQUE: Multiplanar, multiecho pulse sequences of the brain and surrounding structures were obtained without and with intravenous contrast. CONTRAST:  8mL GADAVIST GADOBUTROL 1 MMOL/ML IV SOLN COMPARISON:  None Available. FINDINGS: MRI brain: There are multiple foci of T2 hyperintensity in the cerebral white matter. These do not have restricted diffusion. The signal in the brain parenchyma is normal. No abnormal enhancement. There is no acute or chronic infarct. The ventricles are normal. No mass lesion. There are normal flow  signals in the carotid arteries and basilar artery. No significant bone marrow signal abnormality. No significant abnormality in the paranasal sinuses or soft tissues. IMPRESSION: No evidence of metastatic disease. There are multiple T2 hyperintensities within the cerebral white matter. These abnormalities are nonspecific and of uncertain etiology or clinical significance. They are more common in older patients and patients with risk factors for vascular disease. Electronically Signed   By: Nancyann Burns M.D.   On: 05/09/2024 09:22     CT Chest WO 03/21/24:  FINDINGS: Cardiovascular: Aortic atherosclerosis. Normal heart size,  without pericardial effusion. Three vessel coronary artery calcification.   Mediastinum/Nodes: 1.7 cm left-sided thyroid  nodule has been evaluated on 06/03/2022 ultrasound   No mediastinal or hilar adenopathy, given limitations of unenhanced CT. Tiny hiatal hernia.   Lungs/Pleura: No pleural fluid. Mild to moderate centrilobular emphysema. Left lower lobe mucoid impaction medially.   Interval enlargement of the lingular nodule including on 154/4. 14.8 mm today versus 11.0 mm on the prior.   Other pulmonary nodules are similar at maximally 3.8 mm.   Upper Abdomen: Moderate caudate and lateral segment left liver lobe enlargement. Cholecystectomy. Normal imaged portions of the spleen, adrenal glands.   Musculoskeletal: Mild osteopenia.  Thoracic spondylosis.   IMPRESSION: 1. Lung-RADS 4B, suspicious. Additional imaging evaluation or consultation with Pulmonology or Thoracic Surgery recommended. The lingular nodule has enlarged since 11/04/2023, and is suspicious for primary bronchogenic carcinoma. Consider multidisciplinary thoracic oncology consultation for eventual sampling. 2. Aortic atherosclerosis (ICD10-I70.0), coronary artery atherosclerosis and emphysema (ICD10-J43.9). 3. Hepatic morphology, suspicious for cirrhosis, as before. 4. These results will be  called to the ordering clinician or representative by the Radiologist Assistant, and communication documented in the PACS or [employer].    PET/CT 12/20/23:  FINDINGS: Mediastinal blood pool activity: SUV max 3.1   Liver activity: SUV max 4.2   NECK: There is no specific abnormal uptake identified in the neck including along lymph node change of the submandibular, posterior triangle or internal jugular region. Near symmetric uptake of the visualized intracranial compartment.   Incidental CT findings: The parotid glands, submandibular glands are unremarkable. Cystic nodule along the left thyroid  lobe is again seen as prescribed but does not have any abnormal uptake. Please correlate with prior thyroid  workup including ultrasound of 06/03/2022. Significant streak artifact related to the patient's dental hardware. Paranasal sinuses and mastoid air cells are clear.   CHEST: No abnormal uptake seen above blood pool in the axillary region, hilum or mediastinum. The spiculated nodule in the posterior aspect of the lingula on the previous examination which measured 11 mm, today appears less well defined. Maximal dimension 9 mm on image 66 of today's CT scan. No abnormal uptake. No other areas of abnormal lung uptake. Several other tiny lung nodule on the prior are not as well defined on this PET-CT examination. This could be technical. There is a right-sided air cyst in the lower lobe.   Incidental CT findings: No pleural effusion or pneumothorax. No consolidation. Heart is nonenlarged. No pericardial effusion. Coronary artery calcifications are seen. The thoracic aorta has a normal course and caliber. Slight ectasia of the aortic arch. Small hiatal hernia.   ABDOMEN/PELVIS: There is physiologic distribution radiotracer along the parenchymal organs, bowel and renal collecting systems.   Incidental CT findings: With the limits of the noncontrast, attenuation correction CT,  the liver is grossly preserved as is the spleen, adrenal glands. Previous cholecystectomy. Atrophy of the pancreas. No abnormal calcifications seen within either kidney nor along the course of either ureter. Contracted urinary bladder. Grossly the stomach, small bowel and large bowel are nondilated. Appendix is not well seen. Diffuse vascular calcifications identified no free air or free fluid. Rectus muscle diastasis seen with some midline anterior abdominal wall fat containing hernias.   SKELETON: No abnormal uptake along the visualized osseous structures.   Incidental CT findings: Scattered degenerative changes identified along the spine. Streak artifact related to the fixation hardware of the lower lumbar spine, L5-S1. Degenerative changes seen along the pelvis and shoulders.   IMPRESSION: Lingular spiculated nodule  appears slightly smaller today compared to the prior CT. There is also no specific abnormal radiotracer uptake at this time. Recommend follow-up CT imaging in 3 months.   No additional areas of abnormal radiotracer uptake.    CT Chest Lung Cancer Screening 11/04/23:  FINDINGS: Cardiovascular: Bovine arch. Aortic atherosclerosis. Normal heart size, without pericardial effusion. Three vessel coronary artery calcification.   Mediastinum/Nodes: 2.1 cm left-sided thyroid  nodule has been evaluated on previous imaging (02/20/2021).   No mediastinal or hilar adenopathy, given limitations of unenhanced CT. Tiny hiatal hernia.   Lungs/Pleura: No pleural fluid. Mild centrilobular emphysema. Right greater than left lower lobe bronchial wall thickening with mild medial right lower lobe mucoid impaction.   A spiculated lingular nodule of 11 mm is new, including on 168/9. Other pulmonary nodules of maximally 4.1 mm are unchanged.   Upper Abdomen: Moderate caudate and lateral segment left liver lobe enlargement. Cholecystectomy. Normal imaged portions of the  spleen, pancreas, adrenal glands, kidneys.   Musculoskeletal: No acute osseous abnormality.   IMPRESSION: Lung-RADS 4B, suspicious. Additional imaging evaluation or consultation with Pulmonology or Thoracic Surgery recommended. New lingular nodule of 11 mm, highly suspicious for primary bronchogenic carcinoma. Consider PET to direct tissue sampling.   Incidental findings, including: Aortic atherosclerosis (ICD10-I70.0), coronary artery atherosclerosis and emphysema (ICD10-J43.9). Tiny hiatal hernia.   Hepatic morphology for which mild cirrhosis cannot be excluded. Correlate with risk factors.   PATHOLOGY:  Biopsy LUL nodule 04/23/24:  Accession: [id number] Collect Date: 04/23/2024 11:47:00 AM  Non-Gynecological Cytology Report Clinical History: Lung nodule  FINAL MICROSCOPIC DIAGNOSIS: A. LEFT LUNG, UPPER LOBE, NODULE/TARGET 1, BRUSHING: - Negative for malignancy - Scant cellularity; benign bronchial cells  B. LEFT LUNG, UPPER LOBE, NODULE/TARGET 1, FINE NEEDLE ASPIRATION: - Malignant - Non-small cell carcinoma compatible with squamous cell carcinoma  COMMENT: Immunohistochemical stains performed on the scant tumor present within the left upper lobe fine-needle aspiration and cell block (B) show the tumor to be weakly positive for the squamous marker p40 and negative for the pulmonary adeno marker TTF-1 with adequate control. Case is reviewed by Dr. Belvie who concurs with the interpretation. Diagnosis conveyed to Dr. Shelah by Dr. Reed via Epic messaging on 04/25/2024 @ 9:52 AM.   IMPRESSION/PLAN: [name] is a 73 yo F w/ a screen detected Stage IA2 cT1bN0M0 NSCLC (squamous cell carcinoma) of the LUL/lingula.  The patient was seen to discuss management of early-stage non-small cell lung cancer (NSCLC). We reviewed that surgical resection is the standard of care for operable patients, offering excellent long-term local control. However, some patients are not  ideal surgical candidates due to limited pulmonary function, significant comorbidities, or personal preference. For these individuals, stereotactic body radiation therapy (SBRT) provides a highly effective, non-invasive alternative with comparable local control rates.  We discussed the logistics of SBRT in detail. The first step is a simulation CT scan, which includes 4D CT imaging to account for breathing motion. Immobilization devices are used for comfort and reproducibility. The simulation appointment lasts approximately 45 minutes. Treatment planning then takes about one week, during which the radiation oncology team contours the tumor and normal structures and develops a safe, precise treatment plan.  SBRT is typically delivered over 3-5 treatments. Each visit lasts about 30-45 minutes, though the actual beam delivery time is only several minutes. The patient will be seen weekly by the radiation oncology team to monitor tolerance and address any side effects. In the case of a 3-5 fraction treatment, the patient is seen once unless  the need arises for them to be seen additionally.   Potential side effects were reviewed, including cough, shortness of breath, fatigue, transient chest wall discomfort, or late rib fracture depending on tumor location. We also discussed the risk of radiation pneumonitis, which may occur weeks to months after treatment and is usually manageable with corticosteroids. Rare risks include injury to the heart, spinal cord, or major airways, depending on proximity to the target. Overall, SBRT is generally well tolerated with a low risk of significant toxicity.  Post-treatment follow-up will include a clinic visit and chest CT approximately 3 months after SBRT, then every 3-6 months for the first 2 years, and every 6-12 months thereafter to monitor for recurrence and lung function.  The patient's case was discussed in a multidisciplinary thoracic oncology conference with SBRT being  the agreed upon treatment recommendation.  The patient was provided with contact information for the care team and encouraged to reach out with any questions or concerns.  -Simulation scheduled for *** with plan to start radiation the following week of ***    Total time spent today in preparation for this visit was *** minutes. This included patient care, imaging and path review, documentation, multidisciplinary discussion and coordination of care and follow up.    Estefana HERO. Maritza, M.D.

## 2024-05-11 ENCOUNTER — Emergency Department (HOSPITAL_COMMUNITY)

## 2024-05-11 ENCOUNTER — Inpatient Hospital Stay (HOSPITAL_COMMUNITY)
Admission: EM | Admit: 2024-05-11 | Discharge: 2024-05-17 | DRG: 040 | Disposition: A | Discharge status: Rehabilitation Facility | Attending: Internal Medicine | Admitting: Internal Medicine

## 2024-05-11 ENCOUNTER — Ambulatory Visit

## 2024-05-11 ENCOUNTER — Ambulatory Visit
Admission: RE | Admit: 2024-05-11 | Discharge: 2024-05-11 | Disposition: A | Discharge status: Home / Self Care | Source: Ambulatory Visit | Attending: Radiation Oncology | Admitting: Radiation Oncology

## 2024-05-11 ENCOUNTER — Other Ambulatory Visit: Payer: Self-pay

## 2024-05-11 ENCOUNTER — Encounter (HOSPITAL_COMMUNITY): Payer: Self-pay

## 2024-05-11 ENCOUNTER — Ambulatory Visit: Admitting: Radiation Oncology

## 2024-05-11 DIAGNOSIS — Z91013 Allergy to seafood: Secondary | ICD-10-CM

## 2024-05-11 DIAGNOSIS — Z8051 Family history of malignant neoplasm of kidney: Secondary | ICD-10-CM

## 2024-05-11 DIAGNOSIS — Z833 Family history of diabetes mellitus: Secondary | ICD-10-CM

## 2024-05-11 DIAGNOSIS — Z716 Tobacco abuse counseling: Secondary | ICD-10-CM

## 2024-05-11 DIAGNOSIS — Z79899 Other long term (current) drug therapy: Secondary | ICD-10-CM

## 2024-05-11 DIAGNOSIS — R29810 Facial weakness: Secondary | ICD-10-CM | POA: Diagnosis present

## 2024-05-11 DIAGNOSIS — Z741 Need for assistance with personal care: Secondary | ICD-10-CM | POA: Diagnosis present

## 2024-05-11 DIAGNOSIS — Z8249 Family history of ischemic heart disease and other diseases of the circulatory system: Secondary | ICD-10-CM | POA: Diagnosis not present

## 2024-05-11 DIAGNOSIS — J9601 Acute respiratory failure with hypoxia: Secondary | ICD-10-CM | POA: Diagnosis present

## 2024-05-11 DIAGNOSIS — Z7401 Bed confinement status: Secondary | ICD-10-CM | POA: Diagnosis not present

## 2024-05-11 DIAGNOSIS — Z85038 Personal history of other malignant neoplasm of large intestine: Secondary | ICD-10-CM

## 2024-05-11 DIAGNOSIS — T7840XA Allergy, unspecified, initial encounter: Secondary | ICD-10-CM | POA: Diagnosis not present

## 2024-05-11 DIAGNOSIS — K219 Gastro-esophageal reflux disease without esophagitis: Secondary | ICD-10-CM | POA: Diagnosis present

## 2024-05-11 DIAGNOSIS — N179 Acute kidney failure, unspecified: Principal | ICD-10-CM | POA: Diagnosis present

## 2024-05-11 DIAGNOSIS — R9082 White matter disease, unspecified: Secondary | ICD-10-CM | POA: Diagnosis not present

## 2024-05-11 DIAGNOSIS — R296 Repeated falls: Secondary | ICD-10-CM | POA: Insufficient documentation

## 2024-05-11 DIAGNOSIS — Z7902 Long term (current) use of antithrombotics/antiplatelets: Secondary | ICD-10-CM | POA: Diagnosis not present

## 2024-05-11 DIAGNOSIS — E1169 Type 2 diabetes mellitus with other specified complication: Secondary | ICD-10-CM | POA: Diagnosis not present

## 2024-05-11 DIAGNOSIS — I700 Atherosclerosis of aorta: Secondary | ICD-10-CM | POA: Diagnosis present

## 2024-05-11 DIAGNOSIS — I10 Essential (primary) hypertension: Secondary | ICD-10-CM | POA: Diagnosis present

## 2024-05-11 DIAGNOSIS — C3490 Malignant neoplasm of unspecified part of unspecified bronchus or lung: Secondary | ICD-10-CM | POA: Diagnosis not present

## 2024-05-11 DIAGNOSIS — I63431 Cerebral infarction due to embolism of right posterior cerebral artery: Secondary | ICD-10-CM | POA: Diagnosis present

## 2024-05-11 DIAGNOSIS — F1011 Alcohol abuse, in remission: Secondary | ICD-10-CM | POA: Diagnosis not present

## 2024-05-11 DIAGNOSIS — C3412 Malignant neoplasm of upper lobe, left bronchus or lung: Secondary | ICD-10-CM | POA: Diagnosis not present

## 2024-05-11 DIAGNOSIS — Z882 Allergy status to sulfonamides status: Secondary | ICD-10-CM

## 2024-05-11 DIAGNOSIS — G8194 Hemiplegia, unspecified affecting left nondominant side: Secondary | ICD-10-CM | POA: Diagnosis present

## 2024-05-11 DIAGNOSIS — T782XXA Anaphylactic shock, unspecified, initial encounter: Secondary | ICD-10-CM | POA: Diagnosis not present

## 2024-05-11 DIAGNOSIS — G936 Cerebral edema: Secondary | ICD-10-CM | POA: Diagnosis not present

## 2024-05-11 DIAGNOSIS — J441 Chronic obstructive pulmonary disease with (acute) exacerbation: Secondary | ICD-10-CM | POA: Diagnosis present

## 2024-05-11 DIAGNOSIS — I69319 Unspecified symptoms and signs involving cognitive functions following cerebral infarction: Secondary | ICD-10-CM | POA: Diagnosis not present

## 2024-05-11 DIAGNOSIS — T380X5A Adverse effect of glucocorticoids and synthetic analogues, initial encounter: Secondary | ICD-10-CM | POA: Diagnosis present

## 2024-05-11 DIAGNOSIS — Z888 Allergy status to other drugs, medicaments and biological substances status: Secondary | ICD-10-CM

## 2024-05-11 DIAGNOSIS — I63511 Cerebral infarction due to unspecified occlusion or stenosis of right middle cerebral artery: Secondary | ICD-10-CM | POA: Diagnosis present

## 2024-05-11 DIAGNOSIS — J9811 Atelectasis: Secondary | ICD-10-CM | POA: Diagnosis present

## 2024-05-11 DIAGNOSIS — R000 Tachycardia, unspecified: Secondary | ICD-10-CM | POA: Diagnosis not present

## 2024-05-11 DIAGNOSIS — Z7984 Long term (current) use of oral hypoglycemic drugs: Secondary | ICD-10-CM | POA: Diagnosis not present

## 2024-05-11 DIAGNOSIS — R0689 Other abnormalities of breathing: Secondary | ICD-10-CM | POA: Diagnosis not present

## 2024-05-11 DIAGNOSIS — I2510 Atherosclerotic heart disease of native coronary artery without angina pectoris: Secondary | ICD-10-CM | POA: Diagnosis present

## 2024-05-11 DIAGNOSIS — G43009 Migraine without aura, not intractable, without status migrainosus: Secondary | ICD-10-CM | POA: Insufficient documentation

## 2024-05-11 DIAGNOSIS — R911 Solitary pulmonary nodule: Secondary | ICD-10-CM | POA: Diagnosis not present

## 2024-05-11 DIAGNOSIS — Z96653 Presence of artificial knee joint, bilateral: Secondary | ICD-10-CM | POA: Diagnosis not present

## 2024-05-11 DIAGNOSIS — R0602 Shortness of breath: Secondary | ICD-10-CM | POA: Diagnosis not present

## 2024-05-11 DIAGNOSIS — E1140 Type 2 diabetes mellitus with diabetic neuropathy, unspecified: Secondary | ICD-10-CM | POA: Diagnosis present

## 2024-05-11 DIAGNOSIS — I63411 Cerebral infarction due to embolism of right middle cerebral artery: Secondary | ICD-10-CM | POA: Diagnosis not present

## 2024-05-11 DIAGNOSIS — Z823 Family history of stroke: Secondary | ICD-10-CM

## 2024-05-11 DIAGNOSIS — R29707 NIHSS score 7: Secondary | ICD-10-CM | POA: Diagnosis present

## 2024-05-11 DIAGNOSIS — Z9049 Acquired absence of other specified parts of digestive tract: Secondary | ICD-10-CM

## 2024-05-11 DIAGNOSIS — E041 Nontoxic single thyroid nodule: Secondary | ICD-10-CM | POA: Insufficient documentation

## 2024-05-11 DIAGNOSIS — E11621 Type 2 diabetes mellitus with foot ulcer: Secondary | ICD-10-CM | POA: Insufficient documentation

## 2024-05-11 DIAGNOSIS — J449 Chronic obstructive pulmonary disease, unspecified: Secondary | ICD-10-CM | POA: Diagnosis not present

## 2024-05-11 DIAGNOSIS — F32A Depression, unspecified: Secondary | ICD-10-CM | POA: Diagnosis present

## 2024-05-11 DIAGNOSIS — R209 Unspecified disturbances of skin sensation: Secondary | ICD-10-CM | POA: Diagnosis not present

## 2024-05-11 DIAGNOSIS — F418 Other specified anxiety disorders: Secondary | ICD-10-CM | POA: Diagnosis not present

## 2024-05-11 DIAGNOSIS — F410 Panic disorder [episodic paroxysmal anxiety] without agoraphobia: Secondary | ICD-10-CM | POA: Insufficient documentation

## 2024-05-11 DIAGNOSIS — R29818 Other symptoms and signs involving the nervous system: Secondary | ICD-10-CM | POA: Diagnosis not present

## 2024-05-11 DIAGNOSIS — R531 Weakness: Secondary | ICD-10-CM | POA: Diagnosis present

## 2024-05-11 DIAGNOSIS — N182 Chronic kidney disease, stage 2 (mild): Secondary | ICD-10-CM | POA: Insufficient documentation

## 2024-05-11 DIAGNOSIS — R270 Ataxia, unspecified: Secondary | ICD-10-CM | POA: Diagnosis present

## 2024-05-11 DIAGNOSIS — I639 Cerebral infarction, unspecified: Secondary | ICD-10-CM | POA: Diagnosis present

## 2024-05-11 DIAGNOSIS — D72829 Elevated white blood cell count, unspecified: Secondary | ICD-10-CM | POA: Diagnosis not present

## 2024-05-11 DIAGNOSIS — E876 Hypokalemia: Secondary | ICD-10-CM | POA: Diagnosis present

## 2024-05-11 DIAGNOSIS — Z794 Long term (current) use of insulin: Secondary | ICD-10-CM | POA: Diagnosis not present

## 2024-05-11 DIAGNOSIS — R414 Neurologic neglect syndrome: Secondary | ICD-10-CM | POA: Diagnosis present

## 2024-05-11 DIAGNOSIS — F39 Unspecified mood [affective] disorder: Secondary | ICD-10-CM | POA: Diagnosis present

## 2024-05-11 DIAGNOSIS — E119 Type 2 diabetes mellitus without complications: Secondary | ICD-10-CM | POA: Diagnosis not present

## 2024-05-11 DIAGNOSIS — E1165 Type 2 diabetes mellitus with hyperglycemia: Secondary | ICD-10-CM | POA: Diagnosis present

## 2024-05-11 DIAGNOSIS — Z880 Allergy status to penicillin: Secondary | ICD-10-CM

## 2024-05-11 DIAGNOSIS — G43909 Migraine, unspecified, not intractable, without status migrainosus: Secondary | ICD-10-CM | POA: Diagnosis not present

## 2024-05-11 DIAGNOSIS — Z886 Allergy status to analgesic agent status: Secondary | ICD-10-CM

## 2024-05-11 DIAGNOSIS — I69398 Other sequelae of cerebral infarction: Secondary | ICD-10-CM | POA: Diagnosis not present

## 2024-05-11 DIAGNOSIS — G8929 Other chronic pain: Secondary | ICD-10-CM | POA: Diagnosis present

## 2024-05-11 DIAGNOSIS — G720 Drug-induced myopathy: Secondary | ICD-10-CM | POA: Insufficient documentation

## 2024-05-11 DIAGNOSIS — I6340 Cerebral infarction due to embolism of unspecified cerebral artery: Secondary | ICD-10-CM | POA: Diagnosis not present

## 2024-05-11 DIAGNOSIS — I6389 Other cerebral infarction: Secondary | ICD-10-CM | POA: Diagnosis not present

## 2024-05-11 DIAGNOSIS — F331 Major depressive disorder, recurrent, moderate: Secondary | ICD-10-CM | POA: Insufficient documentation

## 2024-05-11 DIAGNOSIS — Z981 Arthrodesis status: Secondary | ICD-10-CM

## 2024-05-11 DIAGNOSIS — Z801 Family history of malignant neoplasm of trachea, bronchus and lung: Secondary | ICD-10-CM

## 2024-05-11 DIAGNOSIS — E785 Hyperlipidemia, unspecified: Secondary | ICD-10-CM | POA: Diagnosis present

## 2024-05-11 DIAGNOSIS — Z7951 Long term (current) use of inhaled steroids: Secondary | ICD-10-CM | POA: Diagnosis not present

## 2024-05-11 DIAGNOSIS — R9431 Abnormal electrocardiogram [ECG] [EKG]: Secondary | ICD-10-CM | POA: Diagnosis not present

## 2024-05-11 DIAGNOSIS — I69393 Ataxia following cerebral infarction: Secondary | ICD-10-CM | POA: Diagnosis not present

## 2024-05-11 DIAGNOSIS — I69354 Hemiplegia and hemiparesis following cerebral infarction affecting left non-dominant side: Secondary | ICD-10-CM | POA: Diagnosis not present

## 2024-05-11 DIAGNOSIS — R609 Edema, unspecified: Secondary | ICD-10-CM | POA: Diagnosis not present

## 2024-05-11 DIAGNOSIS — F419 Anxiety disorder, unspecified: Secondary | ICD-10-CM | POA: Diagnosis present

## 2024-05-11 DIAGNOSIS — F17210 Nicotine dependence, cigarettes, uncomplicated: Secondary | ICD-10-CM | POA: Diagnosis not present

## 2024-05-11 DIAGNOSIS — Z885 Allergy status to narcotic agent status: Secondary | ICD-10-CM

## 2024-05-11 LAB — ETHANOL: Alcohol, Ethyl (B): <15 mg/dL (ref <15)

## 2024-05-11 LAB — I-STAT CHEM 8, ED
BUN: 17 mg/dL (ref 8–23)
Calcium, Ion: 1.24 mmol/L (ref 1.15–1.40)
Chloride: 98 mmol/L (ref 98–111)
Creatinine, Ser: 1.2 mg/dL — ABNORMAL HIGH (ref 0.44–1.00)
Glucose, Bld: 256 mg/dL — ABNORMAL HIGH (ref 70–99)
HCT: 38 % (ref 36.0–46.0)
Hemoglobin: 12.9 g/dL (ref 12.0–15.0)
Potassium: 3.1 mmol/L — ABNORMAL LOW (ref 3.5–5.1)
Sodium: 138 mmol/L (ref 135–145)
TCO2: 23 mmol/L (ref 22–32)

## 2024-05-11 LAB — URINE DRUG SCREEN
Amphetamines: NEGATIVE
Barbiturates: NEGATIVE
Benzodiazepines: NEGATIVE
Cocaine: NEGATIVE
Fentanyl: NEGATIVE
Methadone Scn, Ur: NEGATIVE
Opiates: NEGATIVE
Tetrahydrocannabinol: NEGATIVE

## 2024-05-11 LAB — CBC WITH DIFFERENTIAL/PLATELET
Abs Immature Granulocytes: 0.04 K/uL (ref 0.00–0.07)
Basophils Absolute: 0 K/uL (ref 0.0–0.1)
Basophils Relative: 0 %
Eosinophils Absolute: 0 K/uL (ref 0.0–0.5)
Eosinophils Relative: 0 %
HCT: 45.6 % (ref 36.0–46.0)
Hemoglobin: 14.6 g/dL (ref 12.0–15.0)
Immature Granulocytes: 0 %
Lymphocytes Relative: 37 %
Lymphs Abs: 4.3 K/uL — ABNORMAL HIGH (ref 0.7–4.0)
MCH: 31.3 pg (ref 26.0–34.0)
MCHC: 32 g/dL (ref 30.0–36.0)
MCV: 97.6 fL (ref 80.0–100.0)
Monocytes Absolute: 0.2 K/uL (ref 0.1–1.0)
Monocytes Relative: 2 %
Neutro Abs: 7.1 K/uL (ref 1.7–7.7)
Neutrophils Relative %: 61 %
Platelets: 273 K/uL (ref 150–400)
RBC: 4.67 MIL/uL (ref 3.87–5.11)
RDW: 12.8 % (ref 11.5–15.5)
WBC: 11.7 K/uL — ABNORMAL HIGH (ref 4.0–10.5)
nRBC: 0 % (ref 0.0–0.2)

## 2024-05-11 LAB — COMPREHENSIVE METABOLIC PANEL WITH GFR
ALT: 12 U/L (ref 0–44)
AST: 27 U/L (ref 15–41)
Albumin: 3.6 g/dL (ref 3.5–5.0)
Alkaline Phosphatase: 74 U/L (ref 38–126)
Anion gap: 15 (ref 5–15)
BUN: 17 mg/dL (ref 8–23)
CO2: 23 mmol/L (ref 22–32)
Calcium: 9.3 mg/dL (ref 8.9–10.3)
Chloride: 99 mmol/L (ref 98–111)
Creatinine, Ser: 1.15 mg/dL — ABNORMAL HIGH (ref 0.44–1.00)
GFR, Estimated: 50 mL/min — ABNORMAL LOW (ref >60)
Glucose, Bld: 256 mg/dL — ABNORMAL HIGH (ref 70–99)
Potassium: 3.2 mmol/L — ABNORMAL LOW (ref 3.5–5.1)
Sodium: 137 mmol/L (ref 135–145)
Total Bilirubin: 0.2 mg/dL (ref 0.0–1.2)
Total Protein: 5.9 g/dL — ABNORMAL LOW (ref 6.5–8.1)

## 2024-05-11 LAB — BASIC METABOLIC PANEL WITH GFR
Anion gap: 16 — ABNORMAL HIGH (ref 5–15)
BUN: 18 mg/dL (ref 8–23)
CO2: 23 mmol/L (ref 22–32)
Calcium: 9.7 mg/dL (ref 8.9–10.3)
Chloride: 102 mmol/L (ref 98–111)
Creatinine, Ser: 1.14 mg/dL — ABNORMAL HIGH (ref 0.44–1.00)
GFR, Estimated: 51 mL/min — ABNORMAL LOW (ref >60)
Glucose, Bld: 252 mg/dL — ABNORMAL HIGH (ref 70–99)
Potassium: 3.6 mmol/L (ref 3.5–5.1)
Sodium: 140 mmol/L (ref 135–145)

## 2024-05-11 LAB — PROTIME-INR
INR: 1.1 (ref 0.8–1.2)
Prothrombin Time: 14.4 s (ref 11.4–15.2)

## 2024-05-11 LAB — D-DIMER, QUANTITATIVE: D-Dimer, Quant: 7.84 ug{FEU}/mL — ABNORMAL HIGH (ref 0.00–0.50)

## 2024-05-11 LAB — APTT: aPTT: 27 s (ref 24–36)

## 2024-05-11 LAB — CBG MONITORING, ED
Glucose-Capillary: 251 mg/dL — ABNORMAL HIGH (ref 70–99)
Glucose-Capillary: 252 mg/dL — ABNORMAL HIGH (ref 70–99)

## 2024-05-11 MED ORDER — STROKE: EARLY STAGES OF RECOVERY BOOK
Freq: Once | Status: AC
  Filled 2024-05-11 (×2): qty 1

## 2024-05-11 MED ORDER — MAGNESIUM SULFATE 2 GM/50ML IV SOLN
2.0000 g | Freq: Once | INTRAVENOUS | Status: AC
  Administered 2024-05-11: 2 g via INTRAVENOUS
  Filled 2024-05-11: qty 50

## 2024-05-11 MED ORDER — METOCLOPRAMIDE HCL 5 MG/ML IJ SOLN
10.0000 mg | Freq: Once | INTRAMUSCULAR | Status: AC
  Administered 2024-05-11: 10 mg via INTRAVENOUS
  Filled 2024-05-11: qty 2

## 2024-05-11 MED ORDER — ALBUTEROL SULFATE (2.5 MG/3ML) 0.083% IN NEBU: 10.0000 mg/h | INHALATION_SOLUTION | RESPIRATORY_TRACT | Status: DC

## 2024-05-11 MED ORDER — IPRATROPIUM-ALBUTEROL 0.5-2.5 (3) MG/3ML IN SOLN
3.0000 mL | Freq: Three times a day (TID) | RESPIRATORY_TRACT | Status: DC
  Administered 2024-05-11 – 2024-05-13 (×6): 3 mL via RESPIRATORY_TRACT
  Filled 2024-05-11 (×7): qty 3

## 2024-05-11 MED ORDER — IOHEXOL 350 MG/ML SOLN
100.0000 mL | Freq: Once | INTRAVENOUS | Status: AC | PRN
Start: 2024-05-11 — End: 2024-05-11
  Administered 2024-05-11: 100 mL via INTRAVENOUS

## 2024-05-11 MED ORDER — CLOPIDOGREL BISULFATE 300 MG PO TABS
300.0000 mg | ORAL_TABLET | Freq: Once | ORAL | Status: AC
  Administered 2024-05-11: 300 mg via ORAL
  Filled 2024-05-11: qty 1

## 2024-05-11 MED ORDER — ROSUVASTATIN CALCIUM 5 MG PO TABS
5.0000 mg | ORAL_TABLET | Freq: Every day | ORAL | Status: DC
Start: 2024-05-12 — End: 2024-05-17
  Administered 2024-05-12 – 2024-05-17 (×6): 5 mg via ORAL
  Filled 2024-05-11 (×7): qty 1

## 2024-05-11 MED ORDER — ALBUTEROL SULFATE (2.5 MG/3ML) 0.083% IN NEBU: 2.5000 mg | INHALATION_SOLUTION | RESPIRATORY_TRACT | Status: DC | PRN

## 2024-05-11 MED ORDER — INSULIN ASPART 100 UNIT/ML IJ SOLN
0.0000 [IU] | Freq: Every day | INTRAMUSCULAR | Status: DC
  Administered 2024-05-11: 3 [IU] via SUBCUTANEOUS
  Administered 2024-05-13: 2 [IU] via SUBCUTANEOUS
  Administered 2024-05-14: 3 [IU] via SUBCUTANEOUS
  Filled 2024-05-11: qty 0.05

## 2024-05-11 MED ORDER — FAMOTIDINE IN NACL 20-0.9 MG/50ML-% IV SOLN
20.0000 mg | Freq: Once | INTRAVENOUS | Status: AC
  Administered 2024-05-11: 20 mg via INTRAVENOUS
  Filled 2024-05-11: qty 50

## 2024-05-11 MED ORDER — ARIPIPRAZOLE 10 MG PO TABS
20.0000 mg | ORAL_TABLET | Freq: Every day | ORAL | Status: DC
  Administered 2024-05-12 – 2024-05-17 (×6): 20 mg via ORAL
  Filled 2024-05-11 (×5): qty 2
  Filled 2024-05-11: qty 4
  Filled 2024-05-11: qty 2

## 2024-05-11 MED ORDER — AMITRIPTYLINE HCL 25 MG PO TABS
100.0000 mg | ORAL_TABLET | Freq: Every day | ORAL | Status: DC
  Administered 2024-05-11 – 2024-05-16 (×6): 100 mg via ORAL
  Filled 2024-05-11 (×3): qty 4
  Filled 2024-05-11: qty 2
  Filled 2024-05-11 (×4): qty 4

## 2024-05-11 MED ORDER — ALBUTEROL SULFATE (2.5 MG/3ML) 0.083% IN NEBU
INHALATION_SOLUTION | RESPIRATORY_TRACT | Status: AC
  Administered 2024-05-11: 10 mg/h via RESPIRATORY_TRACT
  Filled 2024-05-11: qty 12

## 2024-05-11 MED ORDER — INSULIN ASPART 100 UNIT/ML IJ SOLN
0.0000 [IU] | Freq: Three times a day (TID) | INTRAMUSCULAR | Status: DC
  Administered 2024-05-12: 3 [IU] via SUBCUTANEOUS
  Administered 2024-05-12: 5 [IU] via SUBCUTANEOUS
  Administered 2024-05-12: 2 [IU] via SUBCUTANEOUS
  Administered 2024-05-13: 3 [IU] via SUBCUTANEOUS
  Administered 2024-05-13: 7 [IU] via SUBCUTANEOUS
  Administered 2024-05-13: 5 [IU] via SUBCUTANEOUS
  Administered 2024-05-14: 2 [IU] via SUBCUTANEOUS
  Administered 2024-05-14: 3 [IU] via SUBCUTANEOUS
  Administered 2024-05-14: 5 [IU] via SUBCUTANEOUS
  Administered 2024-05-15: 3 [IU] via SUBCUTANEOUS
  Administered 2024-05-15: 5 [IU] via SUBCUTANEOUS
  Administered 2024-05-15 – 2024-05-16 (×2): 7 [IU] via SUBCUTANEOUS
  Administered 2024-05-16: 2 [IU] via SUBCUTANEOUS
  Administered 2024-05-16: 1 [IU] via SUBCUTANEOUS
  Administered 2024-05-17: 2 [IU] via SUBCUTANEOUS
  Administered 2024-05-17: 3 [IU] via SUBCUTANEOUS
  Filled 2024-05-11: qty 0.09

## 2024-05-11 MED ORDER — IOHEXOL 350 MG/ML SOLN
75.0000 mL | Freq: Once | INTRAVENOUS | Status: AC | PRN
  Administered 2024-05-11: 75 mL via INTRAVENOUS

## 2024-05-11 MED ORDER — SODIUM CHLORIDE 0.9 % IV SOLN: INTRAVENOUS | Status: AC

## 2024-05-11 MED ORDER — METHYLPREDNISOLONE SODIUM SUCC 40 MG IJ SOLR
40.0000 mg | Freq: Two times a day (BID) | INTRAMUSCULAR | Status: DC
  Administered 2024-05-12 – 2024-05-15 (×7): 40 mg via INTRAVENOUS
  Filled 2024-05-11 (×7): qty 1

## 2024-05-11 MED ORDER — BUDESONIDE 0.25 MG/2ML IN SUSP
0.2500 mg | Freq: Two times a day (BID) | RESPIRATORY_TRACT | Status: DC
Start: 2024-05-11 — End: 2024-05-17
  Administered 2024-05-11 – 2024-05-17 (×12): 0.25 mg via RESPIRATORY_TRACT
  Filled 2024-05-11 (×12): qty 2

## 2024-05-11 MED ORDER — POTASSIUM CHLORIDE CRYS ER 20 MEQ PO TBCR
40.0000 meq | EXTENDED_RELEASE_TABLET | Freq: Once | ORAL | Status: AC
  Administered 2024-05-11: 40 meq via ORAL
  Filled 2024-05-11: qty 2

## 2024-05-11 MED ORDER — CLOPIDOGREL BISULFATE 75 MG PO TABS
75.0000 mg | ORAL_TABLET | Freq: Every day | ORAL | Status: DC
Start: 2024-05-12 — End: 2024-05-17
  Administered 2024-05-12 – 2024-05-17 (×6): 75 mg via ORAL
  Filled 2024-05-11 (×6): qty 1

## 2024-05-11 MED ORDER — GABAPENTIN 100 MG PO CAPS
100.0000 mg | ORAL_CAPSULE | Freq: Two times a day (BID) | ORAL | Status: DC
  Administered 2024-05-11 – 2024-05-17 (×12): 100 mg via ORAL
  Filled 2024-05-11 (×12): qty 1

## 2024-05-11 MED ORDER — ENOXAPARIN SODIUM 40 MG/0.4ML IJ SOSY
40.0000 mg | PREFILLED_SYRINGE | INTRAMUSCULAR | Status: DC
  Administered 2024-05-11 – 2024-05-16 (×6): 40 mg via SUBCUTANEOUS
  Filled 2024-05-11 (×6): qty 0.4

## 2024-05-11 NOTE — H&P (Signed)
 History and Physical    [name] FMW:[id number] DOB: 04/30/1951 DOA: 05/11/2024  PCP: Christy Anthony RAMAN, FNP  Patient coming from: Home  Chief Complaint: Shortness of breath  HPI: [name] is a 73 y.o. female with medical history significant of newly diagnosed squamous cell carcinoma of the lung, COPD, tobacco use disorder, hyperlipidemia, coronary artery calcification seen on previous CT scan, depression, type 2 diabetes, GERD, hypertension, migraines, osteoarthritis presented to the ED with sudden onset shortness of breath and concern for allergic reaction after taking BC powder for a headache.  Initial SpO2 65% on room air with dyspnea and facial swelling.  She was given Solu-Medrol , 2 rounds of epinephrine , and Zofran  by EMS.  On arrival to the ED, patient was tachypneic but satting 96% on 6 L [place].  Code stroke activated as patient was noted to have left-sided neglect and weakness.  LKW 12:30 AM and outside of window for TNK on arrival.  Labs notable for WBC count 11.7, potassium 3.2, glucose 256, creatinine 1.1 (previously 0.6 almost 2 years ago), D-dimer 7.84, ethanol level <15, UDS pending.  ABG with pH 7.31, pCO2 49, and pO2 88.  CTA chest negative for PE.  CT head showing no acute intracranial abnormality.  CTA head and neck negative for LVO and CT perfusion study showing no evidence of ischemia.  Neurology recommended admission to [facility] for stroke workup.  Patient was given Reglan , albuterol  neb, IV Pepcid , and IV mag.  TRH called to admit.  Patient states she has been feeling short of breath since yesterday.  Today she took BC powder for a headache and soon after her breathing became much worse and she started feeling lightheaded.  She used her home albuterol  inhaler which did not help.  Since she could not drive herself to the hospital, she called 911.  Patient states she has taken [facility] powder several times in the past and never had an allergic reaction until today.   She reports improvement of her dyspnea after receiving treatments by EMS/in the ED.  Reports chronic cough related to COPD and cigarette smoking.  Denies chest pain.  Patient states she did not know that she was weak on her left side until she was told in the emergency room.  She denies history of previous stroke.  Review of Systems:  Review of Systems  All other systems reviewed and are negative.   Past Medical History:  Diagnosis Date   Alcohol abuse, in remission    Anxiety    Cancer of sigmoid colon (HCC) 07/2005   T2N0M0   Chronic bronchitis    COPD (chronic obstructive pulmonary disease) (HCC)    Depression    Diabetes mellitus without complication (HCC)    GERD (gastroesophageal reflux disease)    History of thyroid  nodule    monitoring   Hyperlipidemia    Hypertension    Migraines    OA (osteoarthritis)    PONV (postoperative nausea and vomiting)    Reflux     Past Surgical History:  Procedure Laterality Date   ABDOMINAL EXPOSURE N/A 11/24/2016   Procedure: ABDOMINAL EXPOSURE;  Surgeon: Krystal JULIANNA Doing, MD;  Location: [facility] OR;  Service: Vascular;  Laterality: N/A;   ABDOMINAL HYSTERECTOMY  1992   age 57 and appendics   ANTERIOR LUMBAR FUSION Bilateral 11/24/2016   Procedure: LUMBAR 5-SACRUM 1 ANTERIOR LUMBAR INTERBODY FUSION;  Surgeon: Oneil Priestly, MD;  Location: [facility] OR;  Service: Orthopedics;  Laterality: Bilateral;  LUMBAR 5-SACRUM 1  ANTERIOR LUMBAR INTERBODY FUSION    ARTHRODESIS METATARSALPHALANGEAL JOINT (MTPJ) Left 08/15/2023   Procedure: ARTHRODESIS METATARSALPHALANGEAL JOINT (MTPJ);  Surgeon: Tobie Franky SQUIBB, DPM;  Location: [facility] ORS;  Service: Orthopedics/Podiatry;  Laterality: Left;  POPLITEAL/SAPHENOUS BLOCK   bilateral breast surgery      benign tumors removed   CHOLECYSTECTOMY  2007   COLON RESECTION  2006   COLONOSCOPY  2019   HAND SURGERY  2010   repair of incisional hernia  2012   SHOULDER ARTHROSCOPY Left 2009   SHOULDER SURGERY Right 1997   rotator  cuff   TONSILLECTOMY     as a child   TOTAL KNEE ARTHROPLASTY Left 08/11/2021   Procedure: TOTAL KNEE ARTHROPLASTY;  Surgeon: Beverley Evalene BIRCH, MD;  Location: [facility] ORS;  Service: Orthopedics;  Laterality: Left;   TOTAL KNEE ARTHROPLASTY Right 06/29/2022   Procedure: TOTAL KNEE ARTHROPLASTY;  Surgeon: Beverley Evalene BIRCH, MD;  Location: [facility] ORS;  Service: Orthopedics;  Laterality: Right;   VIDEO BRONCHOSCOPY WITH ENDOBRONCHIAL NAVIGATION N/A 04/23/2024   Procedure: VIDEO BRONCHOSCOPY WITH ENDOBRONCHIAL NAVIGATION;  Surgeon: Christy Lamar RAMAN, MD;  Location: [facility] ENDOSCOPY;  Service: Pulmonary;  Laterality: N/A;  lingular nodule     reports that she has been smoking cigarettes and e-cigarettes. She started smoking about 54 years ago. She has a 41.1 pack-year smoking history. She has never used smokeless tobacco. She reports that she does not drink alcohol and does not use drugs.  Allergies  Allergen Reactions   Benadryl  [Diphenhydramine  Hcl] Nausea And Vomiting and Other (See Comments)    Migraine headaches.   Lansoprazole Nausea And Vomiting and Other (See Comments)    Migraine headache  Prevacid* due to dye in med    Morphine Sulfate Nausea And Vomiting and Other (See Comments)    Migraine headaches.   Red Dye #40 (Allura Red) Other (See Comments)    Also allergic to [facility] Dye-migraine headaches.   Simvastatin Nausea And Vomiting and Other (See Comments)    Migraine    Cephalexin     Other Reaction(s): severe fatigue   Macrolides And Ketolides Nausea And Vomiting   Other     Brown dye,medicine with brown dye   Codeine Nausea And Vomiting   Penicillins Nausea And Vomiting and Other (See Comments)    INTOLERANCE >  NAUSEA & VOMITING YEAST INFECTION > CHANGE IN NORMAL FLORA  Has patient had a PCN reaction causing immediate rash, facial/tongue/throat swelling, SOB or lightheadedness with hypotension:No Has patient had a PCN reaction causing severe rash involving mucus membranes or skin  necrosis: No Has patient had a PCN reaction that required hospitalization No Has patient had a PCN reaction occurring within the last 10 years:No    Shellfish Allergy Other (See Comments)    Congestion   Sulfa Antibiotics Nausea And Vomiting    Family History  Problem Relation Age of Onset   Heart disease Mother        Previous MI at age 28-55   Atrial fibrillation Mother    Stroke Mother    Heart failure Mother    Seizures Father        poss d/t alcohol   Cancer Father 13       Prostate cancer    Diabetes Sister    Arrhythmia Sister    Cancer Maternal Aunt        lung cancer    Cancer Maternal Uncle        kidney ca    Prior to Admission  medications   Medication Sig Start Date End Date Taking? Authorizing Provider  amitriptyline  (ELAVIL ) 25 MG tablet Take 100 mg by mouth at bedtime. 05/24/22   [provider]  ARIPiprazole  (ABILIFY ) 20 MG tablet Take 20 mg by mouth daily. 02/12/20   [provider]  calcium  carbonate (TUMS - DOSED IN MG ELEMENTAL CALCIUM ) 500 MG chewable tablet Chew 2 tablets by mouth 4 (four) times daily as needed for indigestion or heartburn.    [provider]  Cholecalciferol  1.25 MG (50000 UT) capsule Take 50,000 Units by mouth daily. 04/05/24   [provider]  famotidine  (PEPCID ) 20 MG tablet Take 20 mg by mouth in the morning.    [provider]  gabapentin  (NEURONTIN ) 100 MG capsule Take 100 mg by mouth 2 (two) times daily.    [provider]  metFORMIN  (GLUCOPHAGE -XR) 500 MG 24 hr tablet Take 1,000 mg by mouth 2 (two) times daily. 07/31/20   [provider]  mupirocin ointment (BACTROBAN) 2 % 1 Application. 10/12/23   [provider]  rosuvastatin  (CRESTOR ) 5 MG tablet Take 5 mg by mouth daily.    [provider]  triamcinolone  cream (KENALOG ) 0.5 % 1 application to affected area Externally Twice a day; Duration: 30 days As needed 12/19/23   [provider]   valsartan-hydrochlorothiazide (DIOVAN-HCT) 160-12.5 MG tablet Take 1 tablet by mouth daily. 06/10/23   [provider]    Physical Exam: Vitals:   05/11/24 1348 05/11/24 1459 05/11/24 1530 05/11/24 1630  BP:  (!) 106/59 120/83 (!) 111/59  Pulse: 96 97 98 89  Resp: (!) 37 (!) 21 (!) 25 (!) 22  Temp: 97.6 F (36.4 C)     TempSrc: Oral     SpO2: 98% 97% 99% 99%    Physical Exam Vitals reviewed.  Constitutional:      General: She is not in acute distress. HENT:     Head: Normocephalic and atraumatic.  Eyes:     Extraocular Movements: Extraocular movements intact.  Cardiovascular:     Rate and Rhythm: Normal rate and regular rhythm.     Pulses: Normal pulses.  Pulmonary:     Effort: Pulmonary effort is normal. No respiratory distress.     Comments: Mild scattered wheezing Abdominal:     General: Bowel sounds are normal. There is no distension.     Palpations: Abdomen is soft.     Tenderness: There is no abdominal tenderness. There is no guarding.  Musculoskeletal:     Cervical back: Normal range of motion.     Right lower leg: No edema.     Left lower leg: No edema.  Skin:    General: Skin is warm and dry.  Neurological:     Mental Status: She is alert and oriented to person, place, and time.     Cranial Nerves: No cranial nerve deficit.     Motor: Weakness present.     Comments: Left upper and lower extremity weakness     Labs on Admission: I have personally reviewed following labs and imaging studies  CBC: Recent Labs  Lab 05/11/24 1300  WBC 11.7*  NEUTROABS 7.1  HGB 14.6  HCT 45.6  MCV 97.6  PLT 273   Basic Metabolic Panel: Recent Labs  Lab 05/11/24 1437 05/11/24 1837  NA 140 137  K 3.6 3.2*  CL 102 99  CO2 23 23  GLUCOSE 252* 256*  BUN 18 17  CREATININE 1.14* 1.15*  CALCIUM  9.7 9.3  GFR: Estimated Creatinine Clearance: 44.6 mL/min (A) (by C-G formula based on SCr of 1.15 mg/dL (H)). Liver Function Tests: Recent Labs  Lab  05/11/24 1837  AST 27  ALT 12  ALKPHOS 74  BILITOT 0.2  PROT 5.9*  ALBUMIN 3.6   No results for input(s): LIPASE, AMYLASE in the last 168 hours. No results for input(s): AMMONIA in the last 168 hours. Coagulation Profile: No results for input(s): INR, PROTIME in the last 168 hours. Cardiac Enzymes: No results for input(s): CKTOTAL, CKMB, CKMBINDEX, TROPONINI in the last 168 hours. BNP (last 3 results) No results for input(s): PROBNP in the last 8760 hours. HbA1C: No results for input(s): HGBA1C in the last 72 hours. CBG: Recent Labs  Lab 05/08/24 1453  GLUCAP 139*   Lipid Profile: No results for input(s): CHOL, HDL, LDLCALC, TRIG, CHOLHDL, LDLDIRECT in the last 72 hours. Thyroid  Function Tests: No results for input(s): TSH, T4TOTAL, FREET4, T3FREE, THYROIDAB in the last 72 hours. Anemia Panel: No results for input(s): VITAMINB12, FOLATE, FERRITIN, TIBC, IRON, RETICCTPCT in the last 72 hours. Urine analysis:    Component Value Date/Time   COLORURINE YELLOW 11/16/2016 1034   APPEARANCEUR HAZY (A) 11/16/2016 1034   LABSPEC 1.011 11/16/2016 1034   PHURINE 6.0 11/16/2016 1034   GLUCOSEU NEGATIVE 11/16/2016 1034   HGBUR SMALL (A) 11/16/2016 1034   BILIRUBINUR NEGATIVE 11/16/2016 1034   KETONESUR NEGATIVE 11/16/2016 1034   PROTEINUR NEGATIVE 11/16/2016 1034   UROBILINOGEN 0.2 09/01/2010 1030   NITRITE NEGATIVE 11/16/2016 1034   LEUKOCYTESUR MODERATE (A) 11/16/2016 1034    Radiological Exams on Admission: CT ANGIO HEAD NECK W WO CM W PERF (CODE STROKE) Result Date: 05/11/2024 EXAM: CTA Head and Neck with Perfusion 05/11/2024 06:08:14 PM TECHNIQUE: CTA of the head and neck was performed without and with the administration of 100 mL of intravenous contrast (iohexol  (OMNIPAQUE ) 350 MG/ML injection 100 mL IOHEXOL  350 MG/ML SOLN). 3D postprocessing with multiplanar reconstructions and MIPs was performed to evaluate the  vascular anatomy. Cerebral perfusion analysis using computed tomography with contrast administration, including post-processing of parametric maps with determination of cerebral blood flow, cerebral blood volume, mean transit time and time-to-maximum. Automated exposure control, iterative reconstruction, and/or weight based adjustment of the mA/kV was utilized to reduce the radiation dose to as low as reasonably achievable. COMPARISON: None available CLINICAL HISTORY: Neuro deficit, acute, stroke suspected. Left sided weakness. FINDINGS: CTA NECK: AORTIC ARCH AND ARCH VESSELS: Common origin of left common carotid artery and a dominant artery is noted. Atherosclerotic changes are present at the aorta and at the origin of the left subclavian artery without aneurysm or focal stenosis. No dissection or arterial injury. No significant stenosis of the brachiocephalic or subclavian arteries. CERVICAL CAROTID ARTERIES: Atherosclerotic changes are present at the right carotid bifurcation and proximal right ICA without focal stenosis. Atherosclerotic changes are present at the left carotid bifurcation and proximal left ICA without focal stenosis. No dissection, arterial injury, or hemodynamically significant stenosis by NASCET criteria. CERVICAL VERTEBRAL ARTERIES: The left vertebral artery is a dominant vessel. Moderate tortuosity is present in the V1 segment of the left vertebral artery without focal stenosis. No dissection, arterial injury, or significant stenosis. LUNGS AND MEDIASTINUM: Unremarkable. SOFT TISSUES: No acute abnormality. BONES: Grade 1 anterolisthesis and moderate right facet degenerative change leads to moderate right foraminal stenosis at C3-C4. CTA HEAD: ANTERIOR CIRCULATION: No significant stenosis of the internal carotid arteries. No significant stenosis of the anterior cerebral arteries. No significant stenosis of the middle cerebral  arteries. No aneurysm. POSTERIOR CIRCULATION: No significant  stenosis of the posterior cerebral arteries. No significant stenosis of the basilar artery. No significant stenosis of the vertebral arteries. No aneurysm. OTHER: No dural venous sinus thrombosis on this non-dedicated study. CT PERFUSION: EXAM QUALITY: Exam quality is adequate with diagnostic perfusion maps. No significant motion artifact. Appropriate arterial inflow and venous outflow curves. CORE INFARCT (CBF<30% volume): 0 mL TOTAL HYPOPERFUSION (Tmax>6s volume): 0 mL PENUMBRA: Mismatch volume: 0 mL IMPRESSION: 1. No acute large vessel occlusion. 2. No hemodynamically significant stenosis or aneurysm in the head or neck vessels. 3. No evidence of ischemia by CT brain perfusion. Electronically signed by: Lonni Necessary MD 05/11/2024 06:23 PM EDT RP Workstation: [id number]   CT HEAD CODE STROKE WO CONTRAST Result Date: 05/11/2024 EXAM: CT HEAD WITHOUT CONTRAST 05/11/2024 06:08:14 PM TECHNIQUE: CT of the head was performed without the administration of intravenous contrast. Automated exposure control, iterative reconstruction, and/or weight based adjustment of the mA/kV was utilized to reduce the radiation dose to as low as reasonably achievable. COMPARISON: None available. CLINICAL HISTORY: Neuro deficit, acute, stroke suspected. Left sided weakness. FINDINGS: BRAIN AND VENTRICLES: Intravascular contrast from CT angio chest of the same day reduces sensitivity for subarachnoid blood. No focal hemorrhage is present. Periventricular and scattered subcortical white matter hypoattenuation is moderately advanced for age. This most likely reflects the sequelae of chronic microvascular ischemia. No evidence of acute infarct. No hydrocephalus. No extra-axial collection. No mass effect or midline shift. ORBITS: No acute abnormality. SINUSES: No acute abnormality. SOFT TISSUES AND SKULL: No acute soft tissue abnormality. No skull fracture. IMPRESSION: 1. No acute intracranial abnormality. Electronically signed by:  Lonni Necessary MD 05/11/2024 06:13 PM EDT RP Workstation: [id number]   CT Angio Chest PE W and/or Wo Contrast Result Date: 05/11/2024 CLINICAL DATA:  Pulmonary embolism (PE) suspected, low to intermediate prob, positive D-dimer. Difficulty breathing. Possible allergic reaction. EXAM: CT ANGIOGRAPHY CHEST WITH CONTRAST TECHNIQUE: Multidetector CT imaging of the chest was performed using the standard protocol during bolus administration of intravenous contrast. Multiplanar CT image reconstructions and MIPs were obtained to evaluate the vascular anatomy. RADIATION DOSE REDUCTION: This exam was performed according to the departmental dose-optimization program which includes automated exposure control, adjustment of the mA and/or kV according to patient size and/or use of iterative reconstruction technique. CONTRAST:  75mL OMNIPAQUE  IOHEXOL  350 MG/ML SOLN COMPARISON:  None Available. FINDINGS: Cardiovascular: Scattered coronary artery and aortic atherosclerosis. Heart is normal size. Aorta is normal caliber. No filling defects in the pulmonary arteries to suggest pulmonary emboli. Mediastinum/Nodes: No mediastinal, hilar, or axillary adenopathy. Trachea and esophagus are unremarkable. 1.6 cm left thyroid  nodule, unchanged since prior study. This has been evaluated on previous imaging. (ref: J Am Coll Radiol. 2015 Feb;12(2): 143-50). Lungs/Pleura: Lingular nodule again noted measuring 1.4 cm in greatest diameter, unchanged. Biopsy fiducial adjacent to the nodule, unchanged since prior PET CT. No new nodules. Bilateral lower lobe airway thickening with areas of mucous plugging and bibasilar atelectasis. No effusions. Upper Abdomen: No acute findings Musculoskeletal: Chest wall soft tissues are unremarkable. No acute bony abnormality. Review of the MIP images confirms the above findings. IMPRESSION: No evidence of pulmonary embolus. Stable lingular nodule, 1.4 cm. Airway thickening with bibasilar atelectasis.  Coronary artery disease. Aortic Atherosclerosis (ICD10-I70.0). Electronically Signed   By: Franky Crease M.D.   On: 05/11/2024 17:31   DG Chest Port 1 View Result Date: 05/11/2024 EXAM: 1 VIEW(S) XRAY OF THE CHEST 05/11/2024 01:50:00 PM COMPARISON: 04/23/2024 CLINICAL HISTORY: Allergic  reaction [id number]. Per chart: Pt BIB EMS from Home due to sudden difficulty breathing after take her medication BC for migraines possible allergic reaction; initially SpO2 65% on room air. Hx of Lung Cancer and COPD, no O2 at home. Pt has an appointment with Cancer center ; at 2:30. FINDINGS: LUNGS AND PLEURA: Vague nodule with fiducial marker in left mid lung. No pulmonary edema. No pleural effusion. No pneumothorax. HEART AND MEDIASTINUM: No acute abnormality of the cardiac and mediastinal silhouettes. BONES AND SOFT TISSUES: No acute osseous abnormality. IMPRESSION: 1. No acute cardiopulmonary process. 2. Left mid-lung pulmonary   fiducial marker. Electronically signed by: Dayne Hassell MD 05/11/2024 02:10 PM EDT RP Workstation: [id number]    EKG: Independently reviewed.  Sinus rhythm, no significant change since previous tracing.  Assessment and Plan  Acute hypoxemic respiratory failure Patient presented to the ED with sudden onset shortness of breath and concern for severe allergic reaction/anaphylaxis after taking BC powder for a headache.  Initial SpO2 65% on room air with dyspnea and facial swelling.  She received Solu-Medrol  and 2 rounds of epinephrine  by EMS.  Also received albuterol  neb, IV Pepcid , and IV mag in the ED.  Currently satting in the high 90s on 6 L  and no respiratory distress.  She has mild scattered wheezing on auscultation of the lungs.  CTA chest negative for PE.  She does have history of COPD.  Continue treatment with Solu-Medrol  40 mg every 12 hours, DuoNeb every 8 hours, Pulmicort neb twice daily, and albuterol  neb every 2 hours PRN.  Acute left-sided weakness/neglect Code stroke activated  on arrival to the ED.  LKW 12:30 AM and outside of window for TNK on arrival. CT head showing no acute intracranial abnormality.  CTA head and neck negative for LVO and CT perfusion study showing no evidence of ischemia. -Appreciate neurology recommendations -Admit to [facility] -Plavix 300 mg load followed by Plavix 75 mg daily.  No aspirin  due to concern for severe allergic reaction/anaphylaxis to BC powder as above (discussed with neurology). -A1c, fasting lipid panel -MRI of the brain without contrast, if negative, then EEG -Frequent neurochecks -Echocardiogram -Risk factor modification -Telemetry monitoring -PT/OT/SLP  AKI Creatinine 1.1 (previously 0.6 almost 2 years ago).  IV fluid hydration and monitor renal function.  Avoid nephrotoxic agents/hold home valsartan and hydrochlorothiazide.  Mild hypokalemia Replace potassium and continue to monitor labs.  Type 2 diabetes Glucose in the 250s.  Last hemoglobin A1c 6.8 in November 2023, repeat ordered.  Placed on sensitive sliding scale insulin  ACHS.  Hypertension Hold antihypertensives at this time to allow permissive hypertension given concern for stroke.  Newly diagnosed squamous cell carcinoma of the lung Patient was seen by radiation oncology and both surgery and SBRT are being considered as possible treatment options.  Patient is supposed to see cardiothoracic surgery next week.  Outpatient follow-up.  Hyperlipidemia Continue rosuvastatin .  Mood disorder Continue amitriptyline  and aripiprazole .  Chronic pain/neuropathy Continue gabapentin .  DVT prophylaxis: Lovenox Code Status: Full Code (discussed with the patient) Level of care: Progressive Care Unit Admission status: It is my clinical opinion that referral for OBSERVATION is reasonable and necessary in this patient based on the above information provided. The aforementioned taken together are felt to place the patient at high risk for further clinical deterioration.  However, it is anticipated that the patient may be medically stable for discharge from the hospital within 24 to 48 hours.  Editha Ram MD [facility]  If 7PM-7AM, please contact night-coverage [email or web address]  05/11/2024, 7:44 PM

## 2024-05-11 NOTE — Consult Note (Signed)
 [facility] Telemedicine Consult   Requesting Provider: Franklyn DEL Consult Participants: Patient, bedside nurse Location of the provider: [place]  Location of the patient: [facility]  This consult was provided via telemedicine with 2-way video and audio communication. The patient/family was informed that care would be provided in this way and agreed to receive care in this manner.    Chief Complaint: left sided weakness  HPI: 73 year old female who came in without any neurologic complaints today, but did complain of some dizziness in association with shortness of breath and was found to be hypoxemic.  Her initial complaint was I think I am having an allergic reaction but during the course of her evaluation in the emergency department it was incidentally noted that she was not able to move her left side as well as her right.  Due to this finding a code stroke was activated.  She did not noticed that her left side is weak, and on further discussion she did say she was having trouble walking this morning.    LKW: 12:30 am tnk given?: No, outside of window, IR Thrombectomy? No, no lvo Modified Rankin Scale: 0-Completely asymptomatic and back to baseline post- stroke Time of teleneurologist evaluation: 17:26  Exam: Vitals:   05/11/24 1530 05/11/24 1630  BP: 120/83 (!) 111/59  Pulse: 98 89  Resp: (!) 25 (!) 22  Temp:    SpO2: 99% 99%    General: in bed, NAD  1A: Level of Consciousness - 0 1B: Ask Month and Age - 0 1C: 'Blink Eyes' & 'Squeeze Hands' - 0 2: Test Horizontal Extraocular Movements - 0 3: Test Visual Fields - 2 4: Test Facial Palsy - 1 5A: Test Left Arm Motor Drift - 1 5B: Test Right Arm Motor Drift - 0 6A: Test Left Leg Motor Drift - 3 6B: Test Right Leg Motor Drift - 0 7: Test Limb Ataxia - 0 8: Test Sensation - 1 9: Test Language/Aphasia- 0 10: Test Dysarthria - 0 11: Test Extinction/Inattention - 2 NIHSS score:  7   Imaging Reviewed: in bed, nad  Labs reviewed in epic and pertinent values follow: Results for orders placed or performed during the hospital encounter of 05/11/24 (from the past 24 hours)  CBC with Differential     Status: Abnormal   Collection Time: 05/11/24  1:00 PM  Result Value Ref Range   WBC 11.7 (H) 4.0 - 10.5 K/uL   RBC 4.67 3.87 - 5.11 MIL/uL   Hemoglobin 14.6 12.0 - 15.0 g/dL   HCT 54.3 63.9 - 53.9 %   MCV 97.6 80.0 - 100.0 fL   MCH 31.3 26.0 - 34.0 pg   MCHC 32.0 30.0 - 36.0 g/dL   RDW 87.1 88.4 - 84.4 %   Platelets 273 150 - 400 K/uL   nRBC 0.0 0.0 - 0.2 %   Neutrophils Relative % 61 %   Neutro Abs 7.1 1.7 - 7.7 K/uL   Lymphocytes Relative 37 %   Lymphs Abs 4.3 (H) 0.7 - 4.0 K/uL   Monocytes Relative 2 %   Monocytes Absolute 0.2 0.1 - 1.0 K/uL   Eosinophils Relative 0 %   Eosinophils Absolute 0.0 0.0 - 0.5 K/uL   Basophils Relative 0 %   Basophils Absolute 0.0 0.0 - 0.1 K/uL   Immature Granulocytes 0 %   Abs Immature Granulocytes 0.04 0.00 - 0.07 K/uL  Blood gas, arterial (at [facility])     Status: Abnormal   Collection Time:  05/11/24  1:48 PM  Result Value Ref Range   pH, Arterial 7.31 (L) 7.35 - 7.45   pCO2 arterial 49 (H) 32 - 48 mmHg   pO2, Arterial 88 83 - 108 mmHg   Bicarbonate 24.6 20.0 - 28.0 mmol/L   Acid-base deficit 2.4 (H) 0.0 - 2.0 mmol/L   O2 Saturation 98.7 %   Patient temperature 36.4    Allens test (pass/fail) PASS PASS  Basic metabolic panel with GFR     Status: Abnormal   Collection Time: 05/11/24  2:37 PM  Result Value Ref Range   Sodium 140 135 - 145 mmol/L   Potassium 3.6 3.5 - 5.1 mmol/L   Chloride 102 98 - 111 mmol/L   CO2 23 22 - 32 mmol/L   Glucose, Bld 252 (H) 70 - 99 mg/dL   BUN 18 8 - 23 mg/dL   Creatinine, Ser 8.85 (H) 0.44 - 1.00 mg/dL   Calcium  9.7 8.9 - 10.3 mg/dL   GFR, Estimated 51 (L) >60 mL/min   Anion gap 16 (H) 5 - 15  D-dimer, quantitative     Status: Abnormal   Collection Time: 05/11/24  2:37 PM  Result  Value Ref Range   D-Dimer, Quant 7.84 (H) 0.00 - 0.50 ug/mL-FEU      Assessment: 73 year old female who presented initially with shortness of breath, and was found to have left-sided weakness which she had not noticed.  She did have dizziness and trouble walking on awakening this morning, so I do not think she is a candidate for TNK but she was taken for CT perfusion which did not reveal any LVO. I still suspect stroke as etiology, but will need an MRI. She does have a history of lung cancer but no signs of mets.   Recommendations:  - would favor admission to [facility] as she will either need stroke workup or EEG or both(no EEG at [facility] over the weekend) - HgbA1c, fasting lipid panel - MRI of the brain without contrast - Frequent neuro checks - Echocardiogram - CTA head and neck - Prophylactic therapy-Antiplatelet med: Aspirin  - dose 81mg  and plavix 75mg  daily after 300mg  load  - Risk factor modification - Telemetry monitoring - PT consult, OT consult, Speech consult - Stroke team to follow - if MRI is negative, would do EEG  Aisha Seals, MD [facility] [phone]  If 7pm- 7am, please page neurology on call as listed in [facility]ION.

## 2024-05-11 NOTE — Progress Notes (Signed)
 Radiation Oncology         [phone] ________________________________  Name: [name]        MRN: [id number]  Date of Service: 05/11/2024 DOB: May 20, 1951  RR:Yjbzd, Christy RAMAN, FNP  No ref. provider found     REFERRING PHYSICIAN: No ref. provider found  DIAGNOSIS: Malignant neoplasm of upper lobe, L bronchus or lung; C34.12  HISTORY OF PRESENT ILLNESS: [name] is a 73 y.o. female seen in consultation for radiation therapy.  [name] is a 73 yo F w/ a distant hx of Stage I pT2N0 cM0 adenocarcinoma of the sigmoid colon (2006) s/p colectomy and a more recent diagnosis of a Stage IA2 cT1bN0M0 NSCLC (squamous cell carcinoma) of LUL/lingula.   She presented after an April 2025 low dose screening CT demonstrated a LI-RADS 4B lesion, 11 mm in size, in the lingula, which was new compared to the year prior. Subsequent PET scan performed in May 2025 demonstrated no abnormal radiotracer uptake. She underwent short interval CT scan in August 2025 which demonstrated growth of the lingular lesion, measuring up to 14.8 mm and consistent with a primary bronchogenic carcinoma. She underwent bronchoscopy with biopsy on 04/23/24 which returned consistent with a NSCLC (squamous cell carcinoma). Repeat PET scan was performed yesterday and demonstrated the progressive lesion to be hypermetabolic. No nodal or distant metastases were identified.   She had updated PFTs completed on 05/01/24. She has an appointment with thoracic surgery next Tuesday. She is leaning towards surgery if that is an option for her.  I am seeing her today in the ER where she is under observation after presenting with anaphylaxis after taking BC powder for a headache. She has a hx of allergic reactions although none to [facility] powder previously. She noted her arms and hands began to swell and her skin became red. She then felt like she was struggling to breath and that is when she decided to seek medical attention.   She is  feeling better now. No SOB. Generally she denies new or unusual cough. Has a chronic mild cough in the AM. Currently smoking 5-7 cigarettes per day. No fevers or chills. No recent weight loss. Endorses recent weight gain after taking steroids for blisters.  PREVIOUS RADIATION THERAPY: No  AUTOIMMUNE DISEASE: No  MEDICAL DEVICES: No  PREGNANCY: No   PAST MEDICAL HISTORY:  Past Medical History:  Diagnosis Date   Alcohol abuse, in remission    Anxiety    Cancer of sigmoid colon (HCC) 07/2005   T2N0M0   Chronic bronchitis    COPD (chronic obstructive pulmonary disease) (HCC)    Depression    Diabetes mellitus without complication (HCC)    GERD (gastroesophageal reflux disease)    History of thyroid  nodule    monitoring   Hyperlipidemia    Hypertension    Migraines    OA (osteoarthritis)    PONV (postoperative nausea and vomiting)    Reflux        PAST SURGICAL HISTORY: Past Surgical History:  Procedure Laterality Date   ABDOMINAL EXPOSURE N/A 11/24/2016   Procedure: ABDOMINAL EXPOSURE;  Surgeon: Krystal JULIANNA Doing, MD;  Location: [facility] OR;  Service: Vascular;  Laterality: N/A;   ABDOMINAL HYSTERECTOMY  1992   age 46 and appendics   ANTERIOR LUMBAR FUSION Bilateral 11/24/2016   Procedure: LUMBAR 5-SACRUM 1 ANTERIOR LUMBAR INTERBODY FUSION;  Surgeon: Oneil Priestly, MD;  Location: [facility] OR;  Service: Orthopedics;  Laterality: Bilateral;  LUMBAR 5-SACRUM 1 ANTERIOR LUMBAR INTERBODY  FUSION    ARTHRODESIS METATARSALPHALANGEAL JOINT (MTPJ) Left 08/15/2023   Procedure: ARTHRODESIS METATARSALPHALANGEAL JOINT (MTPJ);  Surgeon: Tobie Franky SQUIBB, DPM;  Location: [facility] ORS;  Service: Orthopedics/Podiatry;  Laterality: Left;  POPLITEAL/SAPHENOUS BLOCK   bilateral breast surgery      benign tumors removed   CHOLECYSTECTOMY  2007   COLON RESECTION  2006   COLONOSCOPY  2019   HAND SURGERY  2010   repair of incisional hernia  2012   SHOULDER ARTHROSCOPY Left 2009   SHOULDER SURGERY Right 1997    rotator cuff   TONSILLECTOMY     as a child   TOTAL KNEE ARTHROPLASTY Left 08/11/2021   Procedure: TOTAL KNEE ARTHROPLASTY;  Surgeon: Beverley Evalene BIRCH, MD;  Location: [facility] ORS;  Service: Orthopedics;  Laterality: Left;   TOTAL KNEE ARTHROPLASTY Right 06/29/2022   Procedure: TOTAL KNEE ARTHROPLASTY;  Surgeon: Beverley Evalene BIRCH, MD;  Location: [facility] ORS;  Service: Orthopedics;  Laterality: Right;   VIDEO BRONCHOSCOPY WITH ENDOBRONCHIAL NAVIGATION N/A 04/23/2024   Procedure: VIDEO BRONCHOSCOPY WITH ENDOBRONCHIAL NAVIGATION;  Surgeon: Shelah Lamar RAMAN, MD;  Location: [facility] ENDOSCOPY;  Service: Pulmonary;  Laterality: N/A;  lingular nodule     FAMILY HISTORY:  Family History  Problem Relation Age of Onset   Heart disease Mother        Previous MI at age 73-55   Atrial fibrillation Mother    Stroke Mother    Heart failure Mother    Seizures Father        poss d/t alcohol   Cancer Father 37       Prostate cancer    Diabetes Sister    Arrhythmia Sister    Cancer Maternal Aunt        lung cancer    Cancer Maternal Uncle        kidney ca     SOCIAL HISTORY:  reports that she has been smoking cigarettes and e-cigarettes. She started smoking about 54 years ago. She has a 41.1 pack-year smoking history. She has never used smokeless tobacco. She reports that she does not drink alcohol and does not use drugs.   ALLERGIES: Benadryl  [diphenhydramine  hcl], Lansoprazole, Morphine sulfate, Red dye #40 (allura red), Simvastatin, Cephalexin, Macrolides and ketolides, Other, Codeine, Penicillins, Shellfish allergy, and Sulfa antibiotics   MEDICATIONS:  Current Facility-Administered Medications  Medication Dose Route Frequency Provider Last Rate Last Admin   albuterol  (PROVENTIL ) (2.5 MG/3ML) 0.083% nebulizer solution  10 mg/hr Nebulization Continuous Keith, Kayla N, PA-C   Stopped at 05/11/24 1323   Current Outpatient Medications  Medication Sig Dispense Refill   amitriptyline  (ELAVIL ) 25 MG tablet  Take 100 mg by mouth at bedtime.     ARIPiprazole  (ABILIFY ) 20 MG tablet Take 20 mg by mouth daily.     calcium  carbonate (TUMS - DOSED IN MG ELEMENTAL CALCIUM ) 500 MG chewable tablet Chew 2 tablets by mouth 4 (four) times daily as needed for indigestion or heartburn.     Cholecalciferol  1.25 MG (50000 UT) capsule Take 50,000 Units by mouth daily.     famotidine  (PEPCID ) 20 MG tablet Take 20 mg by mouth in the morning.     gabapentin  (NEURONTIN ) 100 MG capsule Take 100 mg by mouth 2 (two) times daily.     metFORMIN  (GLUCOPHAGE -XR) 500 MG 24 hr tablet Take 1,000 mg by mouth 2 (two) times daily.     mupirocin ointment (BACTROBAN) 2 % 1 Application.     rosuvastatin  (CRESTOR ) 5 MG tablet Take 5 mg by  mouth daily.     triamcinolone  cream (KENALOG ) 0.5 % 1 application to affected area Externally Twice a day; Duration: 30 days As needed     valsartan-hydrochlorothiazide (DIOVAN-HCT) 160-12.5 MG tablet Take 1 tablet by mouth daily.       REVIEW OF SYSTEMS: The patient reports that she is doing well overall despite being in the emergency room in setting of presumed anaphylaxis after taking BC powder for a migraine headache. Denies new or worsening cough. Was feeling short of breath at presentation to ER but currently denies SOB. No fevers or chills. No weight loss rather she has had some weight gain recently since taking steroids. Currently reports swelling of her arms and hands and redness of the skin. Otherwise, ROS negative.     PHYSICAL EXAM:  Wt Readings from Last 3 Encounters:  05/01/24 177 lb (80.3 kg)  04/23/24 180 lb (81.6 kg)  04/11/24 180 lb 3.2 oz (81.7 kg)   Temp Readings from Last 3 Encounters:  05/11/24 97.6 F (36.4 C) (Oral)  05/01/24 (!) 97.5 F (36.4 C) (Oral)  04/23/24 (!) 97 F (36.1 C) (Temporal)   BP Readings from Last 3 Encounters:  05/11/24 (!) 111/59  05/01/24 120/78  04/23/24 101/61   Pulse Readings from Last 3 Encounters:  05/11/24 89  05/01/24 83  04/23/24  88   Pain Assessment Pain Score: 0-No pain/10   Physical Exam Constitutional:      General: She is not in acute distress.    Comments: Laying in hospital bed in the ER  HENT:     Head: Normocephalic.  Eyes:     Extraocular Movements: Extraocular movements intact.  Cardiovascular:     Rate and Rhythm: Normal rate.  Pulmonary:     Effort: Pulmonary effort is normal. No respiratory distress.     Comments: On O2 via [place] Abdominal:     General: There is no distension.  Musculoskeletal:        General: Swelling (diffuse mild swelling of bilateral arms/wrists.hands) present.     Cervical back: Normal range of motion.  Skin:    General: Skin is warm and dry.     Findings: Erythema (mild diffuse erythema) present.  Neurological:     General: No focal deficit present.     Mental Status: She is alert.  Psychiatric:        Mood and Affect: Mood normal.      ECOG = 1   LABORATORY DATA:  Lab Results  Component Value Date   WBC 11.7 (H) 05/11/2024   HGB 14.6 05/11/2024   HCT 45.6 05/11/2024   MCV 97.6 05/11/2024   PLT 273 05/11/2024   Lab Results  Component Value Date   NA 140 05/11/2024   K 3.6 05/11/2024   CL 102 05/11/2024   CO2 23 05/11/2024   Lab Results  Component Value Date   ALT 16 07/31/2021   AST 19 07/31/2021   ALKPHOS 80 07/31/2021   BILITOT 0.6 07/31/2021      RADIOGRAPHY:   PET/CT 05/10/2024  CLINICAL DATA:  Subsequent treatment strategy for restaging of non-small-cell lung cancer. EXAM: NUCLEAR MEDICINE PET SKULL BASE TO THIGH TECHNIQUE: 8.9 mCi F-18 FDG was injected intravenously. Full-ring PET imaging was performed from the skull base to thigh after the radiotracer. CT data was obtained and used for attenuation correction and anatomic localization. Fasting blood glucose: 139 mg/dl COMPARISON:  91/79/7974 lung cancer screening CT. PET of 12/20/2023 also reviewed. FINDINGS: Mediastinal blood pool activity: SUV  max 3.1 Liver activity: SUV max NA NECK:  No areas of abnormal hypermetabolism. Incidental CT findings: Bilateral carotid atherosclerosis. A left-sided 1.6 cm thyroid  nodule has been evaluated on 06/03/2022 ultrasound. No cervical adenopathy. CHEST: No thoracic nodal hypermetabolism. The lingular nodule is hypermetabolic. 1.6 x 1.3 cm and a S.U.V. max of 4.6 cm 36/7. Adjacent biopsy fiducial. Incidental CT findings: Centrilobular emphysema. Aortic and coronary artery calcification. ABDOMEN/PELVIS: No abdominopelvic parenchymal or nodal hypermetabolism. Incidental CT findings: Normal adrenal glands. Hepatic morphology again suspicious for mild cirrhosis. Cholecystectomy. Fat containing ventral abdominal wall laxity. Hysterectomy. SKELETON: No abnormal marrow activity. Incidental CT findings: Lumbosacral spine fixation. IMPRESSION: 1. Hypermetabolic 1.6 cm lingular nodule, without thoracic nodal or distant metastasis. T1b, N0, M0. Stage 1A. 2. Incidental findings, including: Aortic atherosclerosis (ICD10-I70.0), coronary artery atherosclerosis and emphysema (ICD10-J43.9). Probable mild cirrhosis. Electronically Signed   By: Rockey Kilts M.D.   On: 05/10/2024 15:08     MR BRAIN W WO 05/09/2024  CLINICAL DATA:  Lung cancer EXAM: MRI HEAD WITHOUT AND WITH CONTRAST TECHNIQUE: Multiplanar, multiecho pulse sequences of the brain and surrounding structures were obtained without and with intravenous contrast. CONTRAST:  8mL GADAVIST GADOBUTROL 1 MMOL/ML IV SOLN COMPARISON:  None Available. FINDINGS: MRI brain: There are multiple foci of T2 hyperintensity in the cerebral white matter. These do not have restricted diffusion. The signal in the brain parenchyma is normal. No abnormal enhancement. There is no acute or chronic infarct. The ventricles are normal. No mass lesion. There are normal flow signals in the carotid arteries and basilar artery. No significant bone marrow signal abnormality. No significant abnormality in the paranasal sinuses or soft tissues.  IMPRESSION: No evidence of metastatic disease. There are multiple T2 hyperintensities within the cerebral white matter. These abnormalities are nonspecific and of uncertain etiology or clinical significance. They are more common in older patients and patients with risk factors for vascular disease. Electronically Signed   By: Nancyann Burns M.D.   On: 05/09/2024 09:22     CT Chest WO 03/21/24:  FINDINGS: Cardiovascular: Aortic atherosclerosis. Normal heart size, without pericardial effusion. Three vessel coronary artery calcification.   Mediastinum/Nodes: 1.7 cm left-sided thyroid  nodule has been evaluated on 06/03/2022 ultrasound   No mediastinal or hilar adenopathy, given limitations of unenhanced CT. Tiny hiatal hernia.   Lungs/Pleura: No pleural fluid. Mild to moderate centrilobular emphysema. Left lower lobe mucoid impaction medially.   Interval enlargement of the lingular nodule including on 154/4. 14.8 mm today versus 11.0 mm on the prior.   Other pulmonary nodules are similar at maximally 3.8 mm.   Upper Abdomen: Moderate caudate and lateral segment left liver lobe enlargement. Cholecystectomy. Normal imaged portions of the spleen, adrenal glands.   Musculoskeletal: Mild osteopenia.  Thoracic spondylosis.   IMPRESSION: 1. Lung-RADS 4B, suspicious. Additional imaging evaluation or consultation with Pulmonology or Thoracic Surgery recommended. The lingular nodule has enlarged since 11/04/2023, and is suspicious for primary bronchogenic carcinoma. Consider multidisciplinary thoracic oncology consultation for eventual sampling. 2. Aortic atherosclerosis (ICD10-I70.0), coronary artery atherosclerosis and emphysema (ICD10-J43.9). 3. Hepatic morphology, suspicious for cirrhosis, as before. 4. These results will be called to the ordering clinician or representative by the Radiologist Assistant, and communication documented in the PACS or [employer].    PET/CT  12/20/23:  FINDINGS: Mediastinal blood pool activity: SUV max 3.1   Liver activity: SUV max 4.2   NECK: There is no specific abnormal uptake identified in the neck including along lymph node change of the submandibular, posterior triangle  or internal jugular region. Near symmetric uptake of the visualized intracranial compartment.   Incidental CT findings: The parotid glands, submandibular glands are unremarkable. Cystic nodule along the left thyroid  lobe is again seen as prescribed but does not have any abnormal uptake. Please correlate with prior thyroid  workup including ultrasound of 06/03/2022. Significant streak artifact related to the patient's dental hardware. Paranasal sinuses and mastoid air cells are clear.   CHEST: No abnormal uptake seen above blood pool in the axillary region, hilum or mediastinum. The spiculated nodule in the posterior aspect of the lingula on the previous examination which measured 11 mm, today appears less well defined. Maximal dimension 9 mm on image 66 of today's CT scan. No abnormal uptake. No other areas of abnormal lung uptake. Several other tiny lung nodule on the prior are not as well defined on this PET-CT examination. This could be technical. There is a right-sided air cyst in the lower lobe.   Incidental CT findings: No pleural effusion or pneumothorax. No consolidation. Heart is nonenlarged. No pericardial effusion. Coronary artery calcifications are seen. The thoracic aorta has a normal course and caliber. Slight ectasia of the aortic arch. Small hiatal hernia.   ABDOMEN/PELVIS: There is physiologic distribution radiotracer along the parenchymal organs, bowel and renal collecting systems.   Incidental CT findings: With the limits of the noncontrast, attenuation correction CT, the liver is grossly preserved as is the spleen, adrenal glands. Previous cholecystectomy. Atrophy of the pancreas. No abnormal calcifications seen within  either kidney nor along the course of either ureter. Contracted urinary bladder. Grossly the stomach, small bowel and large bowel are nondilated. Appendix is not well seen. Diffuse vascular calcifications identified no free air or free fluid. Rectus muscle diastasis seen with some midline anterior abdominal wall fat containing hernias.   SKELETON: No abnormal uptake along the visualized osseous structures.   Incidental CT findings: Scattered degenerative changes identified along the spine. Streak artifact related to the fixation hardware of the lower lumbar spine, L5-S1. Degenerative changes seen along the pelvis and shoulders.   IMPRESSION: Lingular spiculated nodule appears slightly smaller today compared to the prior CT. There is also no specific abnormal radiotracer uptake at this time. Recommend follow-up CT imaging in 3 months.   No additional areas of abnormal radiotracer uptake.    CT Chest Lung Cancer Screening 11/04/23:  FINDINGS: Cardiovascular: Bovine arch. Aortic atherosclerosis. Normal heart size, without pericardial effusion. Three vessel coronary artery calcification.   Mediastinum/Nodes: 2.1 cm left-sided thyroid  nodule has been evaluated on previous imaging (02/20/2021).   No mediastinal or hilar adenopathy, given limitations of unenhanced CT. Tiny hiatal hernia.   Lungs/Pleura: No pleural fluid. Mild centrilobular emphysema. Right greater than left lower lobe bronchial wall thickening with mild medial right lower lobe mucoid impaction.   A spiculated lingular nodule of 11 mm is new, including on 168/9. Other pulmonary nodules of maximally 4.1 mm are unchanged.   Upper Abdomen: Moderate caudate and lateral segment left liver lobe enlargement. Cholecystectomy. Normal imaged portions of the spleen, pancreas, adrenal glands, kidneys.   Musculoskeletal: No acute osseous abnormality.   IMPRESSION: Lung-RADS 4B, suspicious. Additional imaging evaluation  or consultation with Pulmonology or Thoracic Surgery recommended. New lingular nodule of 11 mm, highly suspicious for primary bronchogenic carcinoma. Consider PET to direct tissue sampling.   Incidental findings, including: Aortic atherosclerosis (ICD10-I70.0), coronary artery atherosclerosis and emphysema (ICD10-J43.9). Tiny hiatal hernia.   Hepatic morphology for which mild cirrhosis cannot be excluded. Correlate with risk factors.  PATHOLOGY:  Biopsy LUL nodule 04/23/24:  Accession: [id number] Collect Date: 04/23/2024 11:47:00 AM  Non-Gynecological Cytology Report Clinical History: Lung nodule  FINAL MICROSCOPIC DIAGNOSIS: A. LEFT LUNG, UPPER LOBE, NODULE/TARGET 1, BRUSHING: - Negative for malignancy - Scant cellularity; benign bronchial cells  B. LEFT LUNG, UPPER LOBE, NODULE/TARGET 1, FINE NEEDLE ASPIRATION: - Malignant - Non-small cell carcinoma compatible with squamous cell carcinoma  COMMENT: Immunohistochemical stains performed on the scant tumor present within the left upper lobe fine-needle aspiration and cell block (B) show the tumor to be weakly positive for the squamous marker p40 and negative for the pulmonary adeno marker TTF-1 with adequate control. Case is reviewed by Dr. Belvie who concurs with the interpretation. Diagnosis conveyed to Dr. Shelah by Dr. Reed via Epic messaging on 04/25/2024 @ 9:52 AM.   IMPRESSION/PLAN: Ms. Escamilla is a 73 yo F w/ a screen detected Stage IA2 cT1bN0M0 NSCLC (squamous cell carcinoma) of the LUL/lingula.  The patient was seen to discuss management of early-stage non-small cell lung cancer (NSCLC). We reviewed that surgical resection is the standard of care for operable patients, offering excellent long-term local control. However, some patients are not ideal surgical candidates due to limited pulmonary function, significant comorbidities, or personal preference. For these individuals, stereotactic body radiation therapy  (SBRT) provides a highly effective, non-invasive alternative with comparable local control rates.  We discussed the logistics of SBRT in detail. The first step is a simulation CT scan, which includes 4D CT imaging to account for breathing motion. Immobilization devices are used for comfort and reproducibility. The simulation appointment lasts approximately 45 minutes. Treatment planning then takes about one week, during which the radiation oncology team contours the tumor and normal structures and develops a safe, precise treatment plan.  SBRT is typically delivered over 3-5 treatments. Each visit lasts about 30-45 minutes, though the actual beam delivery time is only several minutes. The patient will be seen weekly by the radiation oncology team to monitor tolerance and address any side effects. In the case of a 3-5 fraction treatment, the patient is seen once unless the need arises for them to be seen additionally.   Potential side effects were reviewed, including cough, shortness of breath, fatigue, transient chest wall discomfort, or late rib fracture depending on tumor location. We also discussed the risk of radiation pneumonitis, which may occur weeks to months after treatment and is usually manageable with corticosteroids. Rare risks include injury to the heart, spinal cord, or major airways, depending on proximity to the target. Overall, SBRT is generally well tolerated with a low risk of significant toxicity.  Post-treatment follow-up will include a clinic visit and chest CT approximately 3 months after SBRT, then every 3-6 months for the first 2 years, and every 6-12 months thereafter to monitor for recurrence and lung function.  The patient's case was discussed in a multidisciplinary thoracic oncology conference and both surgery and SBRT are possible treatment options. She sees Dr. Kerrin in thoracic surgery on Tuesday. She is leaning towards surgery. If she is able to proceed with surgery  we will not treat with radiation. If she is not going to proceed with surgery, we will treat with radiation. We will go ahead and arrange for simulation scan so that she is ready to go with radiation if surgery is not an option for her.   The patient was provided with contact information for the care team and encouraged to reach out with any questions or concerns.  -Simulation scheduled for Friday, October  17th - consent signed  Total time spent today in preparation for this visit was 60 minutes. This included patient care, imaging and path review, documentation, multidisciplinary discussion and coordination of care and follow up.    Estefana HERO. Maritza, M.D.

## 2024-05-11 NOTE — ED Provider Notes (Addendum)
 [place] EMERGENCY DEPARTMENT AT [facility] Provider Note   CSN: [id number] Arrival date & time: 05/11/24  1151     Patient presents with: Respiratory Distress   [name] is a 73 y.o. female past medical history significant for colon cancer, diabetes, COPD, hypertension, and GERD presents today for an allergic reaction.  Patient reports breathing difficulty after taking regular BC powder for a headache.  Patient initially an SpO2 of 65% on room air.  Patient reports difficulty breathing, erythema, and facial swelling.  Patient received Solu-Medrol  and 2 rounds of epinephrine  by EMS as well as Zofran  for nausea.  Patient currently tachypneic but able to speak and complete sentences, satting at 95% on 6 L nasal cannula.   HPI     Prior to Admission medications   Medication Sig Start Date End Date Taking? Authorizing Provider  amitriptyline  (ELAVIL ) 25 MG tablet Take 100 mg by mouth at bedtime. 05/24/22   [provider]  ARIPiprazole  (ABILIFY ) 20 MG tablet Take 20 mg by mouth daily. 02/12/20   [provider]  calcium  carbonate (TUMS - DOSED IN MG ELEMENTAL CALCIUM ) 500 MG chewable tablet Chew 2 tablets by mouth 4 (four) times daily as needed for indigestion or heartburn.    [provider]  Cholecalciferol  1.25 MG (50000 UT) capsule Take 50,000 Units by mouth daily. 04/05/24   [provider]  famotidine  (PEPCID ) 20 MG tablet Take 20 mg by mouth in the morning.    [provider]  gabapentin  (NEURONTIN ) 100 MG capsule Take 100 mg by mouth 2 (two) times daily.    [provider]  metFORMIN  (GLUCOPHAGE -XR) 500 MG 24 hr tablet Take 1,000 mg by mouth 2 (two) times daily. 07/31/20   [provider]  mupirocin ointment (BACTROBAN) 2 % 1 Application. 10/12/23   [provider]  rosuvastatin  (CRESTOR ) 5 MG tablet Take 5 mg by mouth daily.    [provider]  triamcinolone  cream (KENALOG ) 0.5 % 1  application to affected area Externally Twice a day; Duration: 30 days As needed 12/19/23   [provider]  valsartan-hydrochlorothiazide (DIOVAN-HCT) 160-12.5 MG tablet Take 1 tablet by mouth daily. 06/10/23   [provider]    Allergies: Benadryl  [diphenhydramine  hcl], Lansoprazole, Morphine sulfate, Red dye #40 (allura red), Simvastatin, Cephalexin, Macrolides and ketolides, Other, Codeine, Penicillins, Shellfish allergy, and Sulfa antibiotics    Review of Systems  Respiratory:  Positive for chest tightness and shortness of breath.   Gastrointestinal:  Positive for nausea.  Skin:  Positive for rash.    Updated Vital Signs BP (!) 111/59   Pulse 88   Temp 98.7 F (37.1 C) (Oral)   Resp (!) 22   SpO2 98%   Physical Exam Vitals and nursing note reviewed.  Constitutional:      General: She is not in acute distress.    Appearance: She is well-developed.  HENT:     Head: Normocephalic and atraumatic.     Comments: Generalized erythema on patient's face and puffy/edematous eyes with watering.    Right Ear: External ear normal.     Left Ear: External ear normal.     Nose: Nose normal.     Mouth/Throat:     Mouth: Mucous membranes are moist.     Pharynx: Oropharynx is clear.  Eyes:     Conjunctiva/sclera: Conjunctivae normal.  Cardiovascular:     Rate and Rhythm: Normal rate and regular rhythm.     Heart sounds: No murmur  heard. Pulmonary:     Effort: Respiratory distress present.     Breath sounds: Examination of the right-middle field reveals decreased breath sounds. Examination of the left-middle field reveals decreased breath sounds. Examination of the right-lower field reveals decreased breath sounds. Examination of the left-lower field reveals decreased breath sounds. Decreased breath sounds present.     Comments: Minimal air movement in the upper lobes without wheezing Abdominal:     Palpations: Abdomen is soft.     Tenderness: There is no abdominal  tenderness.  Musculoskeletal:        General: No swelling.     Cervical back: Neck supple.     Comments: Cyanotic, cool hands with strong radial pulses bilaterally.  Skin:    General: Skin is warm and dry.     Capillary Refill: Capillary refill takes less than 2 seconds.  Neurological:     Mental Status: She is alert.  Psychiatric:        Mood and Affect: Mood normal.     (all labs ordered are listed, but only abnormal results are displayed) Labs Reviewed  CBC WITH DIFFERENTIAL/PLATELET - Abnormal; Notable for the following components:      Result Value   WBC 11.7 (*)    Lymphs Abs 4.3 (*)    All other components within normal limits  BLOOD GAS, ARTERIAL - Abnormal; Notable for the following components:   pH, Arterial 7.31 (*)    pCO2 arterial 49 (*)    Acid-base deficit 2.4 (*)    All other components within normal limits  BASIC METABOLIC PANEL WITH GFR - Abnormal; Notable for the following components:   Glucose, Bld 252 (*)    Creatinine, Ser 1.14 (*)    GFR, Estimated 51 (*)    Anion gap 16 (*)    All other components within normal limits  D-DIMER, QUANTITATIVE - Abnormal; Notable for the following components:   D-Dimer, Quant 7.84 (*)    All other components within normal limits  COMPREHENSIVE METABOLIC PANEL WITH GFR - Abnormal; Notable for the following components:   Potassium 3.2 (*)    Glucose, Bld 256 (*)    Creatinine, Ser 1.15 (*)    Total Protein 5.9 (*)    GFR, Estimated 50 (*)    All other components within normal limits  ETHANOL  PROTIME-INR  APTT  URINE DRUG SCREEN  I-STAT CHEM 8, ED    EKG: EKG Interpretation Date/Time:  Friday May 11 2024 12:00:19 EDT Ventricular Rate:  95 PR Interval:  173 QRS Duration:  70 QT Interval:  352 QTC Calculation: 443 R Axis:   -24  Text Interpretation: Sinus rhythm Borderline left axis deviation Low voltage, precordial leads Confirmed by Lenor Hollering [phone]) on 05/11/2024 12:44:03 PM  Radiology: CT  ANGIO HEAD NECK W WO CM W PERF (CODE STROKE) Result Date: 05/11/2024 EXAM: CTA Head and Neck with Perfusion 05/11/2024 06:08:14 PM TECHNIQUE: CTA of the head and neck was performed without and with the administration of 100 mL of intravenous contrast (iohexol  (OMNIPAQUE ) 350 MG/ML injection 100 mL IOHEXOL  350 MG/ML SOLN). 3D postprocessing with multiplanar reconstructions and MIPs was performed to evaluate the vascular anatomy. Cerebral perfusion analysis using computed tomography with contrast administration, including post-processing of parametric maps with determination of cerebral blood flow, cerebral blood volume, mean transit time and time-to-maximum. Automated exposure control, iterative reconstruction, and/or weight based adjustment of the mA/kV was utilized to reduce the radiation dose to as low as reasonably achievable. COMPARISON: None available  CLINICAL HISTORY: Neuro deficit, acute, stroke suspected. Left sided weakness. FINDINGS: CTA NECK: AORTIC ARCH AND ARCH VESSELS: Common origin of left common carotid artery and a dominant artery is noted. Atherosclerotic changes are present at the aorta and at the origin of the left subclavian artery without aneurysm or focal stenosis. No dissection or arterial injury. No significant stenosis of the brachiocephalic or subclavian arteries. CERVICAL CAROTID ARTERIES: Atherosclerotic changes are present at the right carotid bifurcation and proximal right ICA without focal stenosis. Atherosclerotic changes are present at the left carotid bifurcation and proximal left ICA without focal stenosis. No dissection, arterial injury, or hemodynamically significant stenosis by NASCET criteria. CERVICAL VERTEBRAL ARTERIES: The left vertebral artery is a dominant vessel. Moderate tortuosity is present in the V1 segment of the left vertebral artery without focal stenosis. No dissection, arterial injury, or significant stenosis. LUNGS AND MEDIASTINUM: Unremarkable. SOFT TISSUES:  No acute abnormality. BONES: Grade 1 anterolisthesis and moderate right facet degenerative change leads to moderate right foraminal stenosis at C3-C4. CTA HEAD: ANTERIOR CIRCULATION: No significant stenosis of the internal carotid arteries. No significant stenosis of the anterior cerebral arteries. No significant stenosis of the middle cerebral arteries. No aneurysm. POSTERIOR CIRCULATION: No significant stenosis of the posterior cerebral arteries. No significant stenosis of the basilar artery. No significant stenosis of the vertebral arteries. No aneurysm. OTHER: No dural venous sinus thrombosis on this non-dedicated study. CT PERFUSION: EXAM QUALITY: Exam quality is adequate with diagnostic perfusion maps. No significant motion artifact. Appropriate arterial inflow and venous outflow curves. CORE INFARCT (CBF<30% volume): 0 mL TOTAL HYPOPERFUSION (Tmax>6s volume): 0 mL PENUMBRA: Mismatch volume: 0 mL IMPRESSION: 1. No acute large vessel occlusion. 2. No hemodynamically significant stenosis or aneurysm in the head or neck vessels. 3. No evidence of ischemia by CT brain perfusion. Electronically signed by: Lonni Necessary MD 05/11/2024 06:23 PM EDT RP Workstation: [id number]   CT HEAD CODE STROKE WO CONTRAST Result Date: 05/11/2024 EXAM: CT HEAD WITHOUT CONTRAST 05/11/2024 06:08:14 PM TECHNIQUE: CT of the head was performed without the administration of intravenous contrast. Automated exposure control, iterative reconstruction, and/or weight based adjustment of the mA/kV was utilized to reduce the radiation dose to as low as reasonably achievable. COMPARISON: None available. CLINICAL HISTORY: Neuro deficit, acute, stroke suspected. Left sided weakness. FINDINGS: BRAIN AND VENTRICLES: Intravascular contrast from CT angio chest of the same day reduces sensitivity for subarachnoid blood. No focal hemorrhage is present. Periventricular and scattered subcortical white matter hypoattenuation is moderately advanced  for age. This most likely reflects the sequelae of chronic microvascular ischemia. No evidence of acute infarct. No hydrocephalus. No extra-axial collection. No mass effect or midline shift. ORBITS: No acute abnormality. SINUSES: No acute abnormality. SOFT TISSUES AND SKULL: No acute soft tissue abnormality. No skull fracture. IMPRESSION: 1. No acute intracranial abnormality. Electronically signed by: Lonni Necessary MD 05/11/2024 06:13 PM EDT RP Workstation: [id number]   CT Angio Chest PE W and/or Wo Contrast Result Date: 05/11/2024 CLINICAL DATA:  Pulmonary embolism (PE) suspected, low to intermediate prob, positive D-dimer. Difficulty breathing. Possible allergic reaction. EXAM: CT ANGIOGRAPHY CHEST WITH CONTRAST TECHNIQUE: Multidetector CT imaging of the chest was performed using the standard protocol during bolus administration of intravenous contrast. Multiplanar CT image reconstructions and MIPs were obtained to evaluate the vascular anatomy. RADIATION DOSE REDUCTION: This exam was performed according to the departmental dose-optimization program which includes automated exposure control, adjustment of the mA and/or kV according to patient size and/or use of iterative reconstruction technique. CONTRAST:  75mL  OMNIPAQUE  IOHEXOL  350 MG/ML SOLN COMPARISON:  None Available. FINDINGS: Cardiovascular: Scattered coronary artery and aortic atherosclerosis. Heart is normal size. Aorta is normal caliber. No filling defects in the pulmonary arteries to suggest pulmonary emboli. Mediastinum/Nodes: No mediastinal, hilar, or axillary adenopathy. Trachea and esophagus are unremarkable. 1.6 cm left thyroid  nodule, unchanged since prior study. This has been evaluated on previous imaging. (ref: J Am Coll Radiol. 2015 Feb;12(2): 143-50). Lungs/Pleura: Lingular nodule again noted measuring 1.4 cm in greatest diameter, unchanged. Biopsy fiducial adjacent to the nodule, unchanged since prior PET CT. No new nodules.  Bilateral lower lobe airway thickening with areas of mucous plugging and bibasilar atelectasis. No effusions. Upper Abdomen: No acute findings Musculoskeletal: Chest wall soft tissues are unremarkable. No acute bony abnormality. Review of the MIP images confirms the above findings. IMPRESSION: No evidence of pulmonary embolus. Stable lingular nodule, 1.4 cm. Airway thickening with bibasilar atelectasis. Coronary artery disease. Aortic Atherosclerosis (ICD10-I70.0). Electronically Signed   By: Franky Crease M.D.   On: 05/11/2024 17:31   DG Chest Port 1 View Result Date: 05/11/2024 EXAM: 1 VIEW(S) XRAY OF THE CHEST 05/11/2024 01:50:00 PM COMPARISON: 04/23/2024 CLINICAL HISTORY: Allergic reaction [id number]. Per chart: Pt BIB EMS from Home due to sudden difficulty breathing after take her medication BC for migraines possible allergic reaction; initially SpO2 65% on room air. Hx of Lung Cancer and COPD, no O2 at home. Pt has an appointment with Cancer center ; at 2:30. FINDINGS: LUNGS AND PLEURA: Vague nodule with fiducial marker in left mid lung. No pulmonary edema. No pleural effusion. No pneumothorax. HEART AND MEDIASTINUM: No acute abnormality of the cardiac and mediastinal silhouettes. BONES AND SOFT TISSUES: No acute osseous abnormality. IMPRESSION: 1. No acute cardiopulmonary process. 2. Left mid-lung pulmonary   fiducial marker. Electronically signed by: Dayne Hassell MD 05/11/2024 02:10 PM EDT RP Workstation: [id number]     Procedures   Medications Ordered in the ED  albuterol  (PROVENTIL ) (2.5 MG/3ML) 0.083% nebulizer solution (0 mg/hr Nebulization Stopped 05/11/24 1323)  famotidine  (PEPCID ) IVPB 20 mg premix (0 mg Intravenous Stopped 05/11/24 1424)  metoCLOPramide  (REGLAN ) injection 10 mg (10 mg Intravenous Given 05/11/24 1303)  magnesium  sulfate IVPB 2 g 50 mL (0 g Intravenous Stopped 05/11/24 1826)  iohexol  (OMNIPAQUE ) 350 MG/ML injection 75 mL (75 mLs Intravenous Contrast Given 05/11/24 1703)   iohexol  (OMNIPAQUE ) 350 MG/ML injection 100 mL (100 mLs Intravenous Contrast Given 05/11/24 1808)    Clinical Course as of 05/11/24 1945  Fri May 11, 2024  1805 No LVO or anything obvious on CTP. Dr. Michaela ordering STAT MRI. [HN]  1805 CT Angio Chest PE W and/or Wo Contrast No evidence of pulmonary embolus.  Stable lingular nodule, 1.4 cm.  Airway thickening with bibasilar atelectasis.  Coronary artery disease.  Aortic Atherosclerosis (ICD10-I70.0).   [HN]    Clinical Course User Index [HN] Franklyn Sid SAILOR, MD                                 Medical Decision Making Amount and/or Complexity of Data Reviewed Labs: ordered. Radiology: ordered. Decision-making details documented in ED Course.  Risk Prescription drug management. Decision regarding hospitalization.   This patient presents to the ED for concern of allergic reaction differential diagnosis includes anaphylaxis, urticaria, contact dermatitis, bug bite,    Additional history obtained   Additional history obtained from Electronic Medical Record External records from outside source obtained and reviewed including pulmonology and  oncology notes   Lab Tests:  I Ordered, and personally interpreted labs.  The pertinent results include: Leukocytosis at 11.7 without left shift, ABG with mild acidosis at 7.31, elevated pCO2 at 49, PO2 WNL, D-dimer 7.84, BMP with elevated glucose at 252 and elevated creatinine at 1.14 up from a baseline of approximately 0.7, anion gap 16   Imaging Studies ordered:  I ordered imaging studies including CXR  I independently visualized and interpreted imaging which showed no acute cardiopulmonary process.  Left midlung pulmonary fiducial markers I agree with the radiologist interpretation EKG with sinus rhythm and borderline LAD CT head code stroke which showed no acute intracranial abnormality CT angio head and neck which showed no acute LVO.  No hemodynamically significant  stenosis or aneurysm in the head and neck vessels.  No evidence of ischemia by CT brain perfusion.   Medicines ordered and prescription drug management: I ordered medication including continuous DuoNeb and IV mag    I have reviewed the patients home medicines and have made adjustments as needed   Problem List / ED Course:  Upon reassessment after continuous DuoNeb, patient has expiratory wheezing in all lobes Patient reports she has had a shellfish allergy for many decades and has tolerated CT with contrast without issue since discovering that she has this shellfish allergy. Code stroke called after CT, patient having left-sided neglect and weakness Patient to be admitted for acute hypoxia, AKI, and left-sided weakness/neglect Consulted hospitalist, Dr. Alfornia he was agreeable to admission.      Final diagnoses:  AKI (acute kidney injury)  Acute respiratory failure with hypoxia (HCC)  Acute left-sided weakness  Left-sided neglect    ED Discharge Orders     None          Francis Ileana SAILOR, PA-C 05/11/24 1922    Francis Ileana SAILOR, PA-C 05/11/24 1945    Lenor Hollering, MD 05/12/24 [phone]

## 2024-05-11 NOTE — ED Triage Notes (Signed)
 Pt BIB EMS from Home due to sudden difficulty breathing after take her medication BC for migraines possible allergic reaction; initially SpO2 65% on room air. Hx of Lung Cancer and COPD, no O2 at home. Pt has an appointment with Cancer center at 2:30.

## 2024-05-12 ENCOUNTER — Inpatient Hospital Stay (HOSPITAL_COMMUNITY)

## 2024-05-12 ENCOUNTER — Observation Stay (HOSPITAL_COMMUNITY)

## 2024-05-12 ENCOUNTER — Other Ambulatory Visit (HOSPITAL_COMMUNITY)

## 2024-05-12 DIAGNOSIS — J441 Chronic obstructive pulmonary disease with (acute) exacerbation: Secondary | ICD-10-CM | POA: Diagnosis present

## 2024-05-12 DIAGNOSIS — I63511 Cerebral infarction due to unspecified occlusion or stenosis of right middle cerebral artery: Secondary | ICD-10-CM | POA: Diagnosis present

## 2024-05-12 DIAGNOSIS — I10 Essential (primary) hypertension: Secondary | ICD-10-CM | POA: Diagnosis present

## 2024-05-12 DIAGNOSIS — R0602 Shortness of breath: Secondary | ICD-10-CM | POA: Diagnosis not present

## 2024-05-12 DIAGNOSIS — I6340 Cerebral infarction due to embolism of unspecified cerebral artery: Secondary | ICD-10-CM | POA: Diagnosis not present

## 2024-05-12 DIAGNOSIS — T380X5A Adverse effect of glucocorticoids and synthetic analogues, initial encounter: Secondary | ICD-10-CM | POA: Diagnosis present

## 2024-05-12 DIAGNOSIS — D72829 Elevated white blood cell count, unspecified: Secondary | ICD-10-CM | POA: Diagnosis not present

## 2024-05-12 DIAGNOSIS — I639 Cerebral infarction, unspecified: Secondary | ICD-10-CM | POA: Diagnosis not present

## 2024-05-12 DIAGNOSIS — R531 Weakness: Secondary | ICD-10-CM | POA: Diagnosis not present

## 2024-05-12 DIAGNOSIS — E876 Hypokalemia: Secondary | ICD-10-CM | POA: Diagnosis present

## 2024-05-12 DIAGNOSIS — F32A Depression, unspecified: Secondary | ICD-10-CM | POA: Diagnosis present

## 2024-05-12 DIAGNOSIS — G936 Cerebral edema: Secondary | ICD-10-CM | POA: Diagnosis not present

## 2024-05-12 DIAGNOSIS — F39 Unspecified mood [affective] disorder: Secondary | ICD-10-CM | POA: Diagnosis present

## 2024-05-12 DIAGNOSIS — I63411 Cerebral infarction due to embolism of right middle cerebral artery: Secondary | ICD-10-CM | POA: Diagnosis not present

## 2024-05-12 DIAGNOSIS — F17210 Nicotine dependence, cigarettes, uncomplicated: Secondary | ICD-10-CM | POA: Diagnosis present

## 2024-05-12 DIAGNOSIS — C3412 Malignant neoplasm of upper lobe, left bronchus or lung: Secondary | ICD-10-CM | POA: Diagnosis present

## 2024-05-12 DIAGNOSIS — I700 Atherosclerosis of aorta: Secondary | ICD-10-CM | POA: Diagnosis present

## 2024-05-12 DIAGNOSIS — F418 Other specified anxiety disorders: Secondary | ICD-10-CM | POA: Diagnosis not present

## 2024-05-12 DIAGNOSIS — E785 Hyperlipidemia, unspecified: Secondary | ICD-10-CM | POA: Diagnosis present

## 2024-05-12 DIAGNOSIS — R414 Neurologic neglect syndrome: Secondary | ICD-10-CM | POA: Diagnosis present

## 2024-05-12 DIAGNOSIS — E1140 Type 2 diabetes mellitus with diabetic neuropathy, unspecified: Secondary | ICD-10-CM | POA: Diagnosis present

## 2024-05-12 DIAGNOSIS — J9601 Acute respiratory failure with hypoxia: Secondary | ICD-10-CM | POA: Diagnosis present

## 2024-05-12 DIAGNOSIS — Z7401 Bed confinement status: Secondary | ICD-10-CM | POA: Diagnosis not present

## 2024-05-12 DIAGNOSIS — R9082 White matter disease, unspecified: Secondary | ICD-10-CM | POA: Diagnosis not present

## 2024-05-12 DIAGNOSIS — I2510 Atherosclerotic heart disease of native coronary artery without angina pectoris: Secondary | ICD-10-CM | POA: Diagnosis present

## 2024-05-12 DIAGNOSIS — Z7984 Long term (current) use of oral hypoglycemic drugs: Secondary | ICD-10-CM | POA: Diagnosis not present

## 2024-05-12 DIAGNOSIS — N179 Acute kidney failure, unspecified: Secondary | ICD-10-CM | POA: Diagnosis present

## 2024-05-12 DIAGNOSIS — I6389 Other cerebral infarction: Secondary | ICD-10-CM | POA: Diagnosis not present

## 2024-05-12 DIAGNOSIS — C3490 Malignant neoplasm of unspecified part of unspecified bronchus or lung: Secondary | ICD-10-CM | POA: Diagnosis not present

## 2024-05-12 DIAGNOSIS — Z7902 Long term (current) use of antithrombotics/antiplatelets: Secondary | ICD-10-CM | POA: Diagnosis not present

## 2024-05-12 DIAGNOSIS — Z7951 Long term (current) use of inhaled steroids: Secondary | ICD-10-CM | POA: Diagnosis not present

## 2024-05-12 DIAGNOSIS — R29818 Other symptoms and signs involving the nervous system: Secondary | ICD-10-CM | POA: Diagnosis not present

## 2024-05-12 DIAGNOSIS — J9811 Atelectasis: Secondary | ICD-10-CM | POA: Diagnosis present

## 2024-05-12 DIAGNOSIS — J449 Chronic obstructive pulmonary disease, unspecified: Secondary | ICD-10-CM | POA: Diagnosis not present

## 2024-05-12 DIAGNOSIS — Z794 Long term (current) use of insulin: Secondary | ICD-10-CM | POA: Diagnosis not present

## 2024-05-12 DIAGNOSIS — G8194 Hemiplegia, unspecified affecting left nondominant side: Secondary | ICD-10-CM | POA: Diagnosis present

## 2024-05-12 DIAGNOSIS — I63431 Cerebral infarction due to embolism of right posterior cerebral artery: Secondary | ICD-10-CM | POA: Diagnosis present

## 2024-05-12 DIAGNOSIS — E1165 Type 2 diabetes mellitus with hyperglycemia: Secondary | ICD-10-CM | POA: Diagnosis present

## 2024-05-12 LAB — BASIC METABOLIC PANEL WITH GFR
Anion gap: 9 (ref 5–15)
BUN: 16 mg/dL (ref 8–23)
CO2: 25 mmol/L (ref 22–32)
Calcium: 8.7 mg/dL — ABNORMAL LOW (ref 8.9–10.3)
Chloride: 102 mmol/L (ref 98–111)
Creatinine, Ser: 0.87 mg/dL (ref 0.44–1.00)
GFR, Estimated: >60 mL/min (ref >60)
Glucose, Bld: 205 mg/dL — ABNORMAL HIGH (ref 70–99)
Potassium: 4.9 mmol/L (ref 3.5–5.1)
Sodium: 136 mmol/L (ref 135–145)

## 2024-05-12 LAB — CBC
HCT: 35.7 % — ABNORMAL LOW (ref 36.0–46.0)
Hemoglobin: 11.8 g/dL — ABNORMAL LOW (ref 12.0–15.0)
MCH: 31.8 pg (ref 26.0–34.0)
MCHC: 33.1 g/dL (ref 30.0–36.0)
MCV: 96.2 fL (ref 80.0–100.0)
Platelets: 238 K/uL (ref 150–400)
RBC: 3.71 MIL/uL — ABNORMAL LOW (ref 3.87–5.11)
RDW: 12.7 % (ref 11.5–15.5)
WBC: 21.6 K/uL — ABNORMAL HIGH (ref 4.0–10.5)
nRBC: 0 % (ref 0.0–0.2)

## 2024-05-12 LAB — BLOOD GAS, ARTERIAL
Acid-base deficit: 2.4 mmol/L — ABNORMAL HIGH (ref 0.0–2.0)
Bicarbonate: 24.6 mmol/L (ref 20.0–28.0)
Drawn by: 313941
O2 Content: 6 L/min
O2 Saturation: 98.7 %
Patient temperature: 36.4
pCO2 arterial: 49 mmHg — ABNORMAL HIGH (ref 32–48)
pH, Arterial: 7.31 — ABNORMAL LOW (ref 7.35–7.45)
pO2, Arterial: 88 mmHg (ref 83–108)

## 2024-05-12 LAB — GLUCOSE, CAPILLARY
Glucose-Capillary: 230 mg/dL — ABNORMAL HIGH (ref 70–99)
Glucose-Capillary: 150 mg/dL — ABNORMAL HIGH (ref 70–99)

## 2024-05-12 LAB — CBG MONITORING, ED
Glucose-Capillary: 190 mg/dL — ABNORMAL HIGH (ref 70–99)
Glucose-Capillary: 258 mg/dL — ABNORMAL HIGH (ref 70–99)

## 2024-05-12 LAB — LIPID PANEL
Cholesterol: 101 mg/dL (ref 0–200)
HDL: 45 mg/dL (ref >40)
LDL Cholesterol: 23 mg/dL (ref 0–99)
Total CHOL/HDL Ratio: 2.3 ratio
Triglycerides: 166 mg/dL — ABNORMAL HIGH (ref <150)
VLDL: 33 mg/dL (ref 0–40)

## 2024-05-12 LAB — HEMOGLOBIN A1C
Hgb A1c MFr Bld: 7.4 % — ABNORMAL HIGH (ref 4.8–5.6)
Mean Plasma Glucose: 165.68 mg/dL

## 2024-05-12 MED ORDER — FAMOTIDINE 20 MG PO TABS: 20.0000 mg | ORAL_TABLET | Freq: Two times a day (BID) | ORAL | Status: DC

## 2024-05-12 MED ORDER — NICOTINE 14 MG/24HR TD PT24
14.0000 mg | MEDICATED_PATCH | Freq: Every day | TRANSDERMAL | Status: DC
  Administered 2024-05-12 – 2024-05-17 (×6): 14 mg via TRANSDERMAL
  Filled 2024-05-12 (×6): qty 1

## 2024-05-12 MED ORDER — FAMOTIDINE 20 MG PO TABS
20.0000 mg | ORAL_TABLET | Freq: Two times a day (BID) | ORAL | Status: DC
  Administered 2024-05-12 – 2024-05-17 (×10): 20 mg via ORAL
  Filled 2024-05-12 (×10): qty 1

## 2024-05-12 MED ORDER — ACETAMINOPHEN 325 MG PO TABS
650.0000 mg | ORAL_TABLET | Freq: Four times a day (QID) | ORAL | Status: DC | PRN
  Administered 2024-05-12: 650 mg via ORAL
  Filled 2024-05-12: qty 2

## 2024-05-12 NOTE — Progress Notes (Addendum)
 Patient arrived at the unit from ED via [facility],upon pt is alert and oriented x4,CHG bath given,vitals taken,CCMD notified,pt is in 4 L [place],NIH scale 4,pt oriented to the unit and call bell in reach,admission paged

## 2024-05-12 NOTE — Progress Notes (Signed)
 PT Cancellation Note  Patient Details Name: JACOBY RITSEMA MRN: [id number] DOB: 1950/12/28   Cancelled Treatment:    Reason Eval/Treat Not Completed: (P) Other (comment) pt transferred to [facility]. Defer to [facility] Rehab team for PT evaluation.  Elsie Grieves, PT, DPT [facility] Rehabilitation Department Office: [phone]   Elsie Grieves 05/12/2024, 1:27 PM

## 2024-05-12 NOTE — Evaluation (Signed)
 Speech Language Pathology Evaluation Patient Details Name: [name] MRN: [id number] DOB: 07-10-1951 Today's Date: 05/12/2024 Time: 1212-1228 SLP Time Calculation (min) (ACUTE ONLY): 16 min  Problem List:  Patient Active Problem List   Diagnosis Date Noted   Acute CVA (cerebrovascular accident) (HCC) 05/12/2024   Gastroesophageal reflux disease 05/11/2024   Drug-induced myopathy 05/11/2024   Diabetic foot ulcer (HCC) 05/11/2024   Benign essential hypertension 05/11/2024   Major depressive disorder, recurrent, moderate (HCC) 05/11/2024   Malignant neoplasm of lung (HCC) 05/11/2024   Thyroid  nodule 05/11/2024   Panic disorder 05/11/2024   Migraine without aura and responsive to treatment 05/11/2024   Recurrent falls 05/11/2024   Stage 2 chronic kidney disease 05/11/2024   Type 2 diabetes mellitus (HCC) 05/11/2024   Acute hypoxemic respiratory failure (HCC) 05/11/2024   Acute left-sided weakness 05/11/2024   AKI (acute kidney injury) 05/11/2024   Hypokalemia 05/11/2024   Pulmonary nodule 1 cm or greater in diameter 04/23/2024   Lung nodules 04/11/2024   S/P total knee arthroplasty, right 06/29/2022   Multinodular goiter 06/11/2021   Recurrent ventral incisional hernia 06/11/2021   Centrilobular emphysema (HCC) 12/13/2020   Radiculopathy 11/24/2016   H/O colon cancer, stage I 01/19/2013   Nonspecific abnormal electrocardiogram (ECG) (EKG) 03/31/2011   Chest pain 03/31/2011   Hyperlipidemia 03/31/2011   Hypertension 03/31/2011   Tobacco abuse 03/31/2011   Past Medical History:  Past Medical History:  Diagnosis Date   Alcohol abuse, in remission    Anxiety    Cancer of sigmoid colon (HCC) 07/2005   T2N0M0   Chronic bronchitis    COPD (chronic obstructive pulmonary disease) (HCC)    Depression    Diabetes mellitus without complication (HCC)    GERD (gastroesophageal reflux disease)    History of thyroid  nodule    monitoring   Hyperlipidemia    Hypertension     Migraines    OA (osteoarthritis)    PONV (postoperative nausea and vomiting)    Reflux    Past Surgical History:  Past Surgical History:  Procedure Laterality Date   ABDOMINAL EXPOSURE N/A 11/24/2016   Procedure: ABDOMINAL EXPOSURE;  Surgeon: Krystal JULIANNA Doing, MD;  Location: [facility] OR;  Service: Vascular;  Laterality: N/A;   ABDOMINAL HYSTERECTOMY  1992   age 21 and appendics   ANTERIOR LUMBAR FUSION Bilateral 11/24/2016   Procedure: LUMBAR 5-SACRUM 1 ANTERIOR LUMBAR INTERBODY FUSION;  Surgeon: Oneil Priestly, MD;  Location: [facility] OR;  Service: Orthopedics;  Laterality: Bilateral;  LUMBAR 5-SACRUM 1 ANTERIOR LUMBAR INTERBODY FUSION    ARTHRODESIS METATARSALPHALANGEAL JOINT (MTPJ) Left 08/15/2023   Procedure: ARTHRODESIS METATARSALPHALANGEAL JOINT (MTPJ);  Surgeon: Tobie Franky SQUIBB, DPM;  Location: [facility] ORS;  Service: Orthopedics/Podiatry;  Laterality: Left;  POPLITEAL/SAPHENOUS BLOCK   bilateral breast surgery      benign tumors removed   CHOLECYSTECTOMY  2007   COLON RESECTION  2006   COLONOSCOPY  2019   HAND SURGERY  2010   repair of incisional hernia  2012   SHOULDER ARTHROSCOPY Left 2009   SHOULDER SURGERY Right 1997   rotator cuff   TONSILLECTOMY     as a child   TOTAL KNEE ARTHROPLASTY Left 08/11/2021   Procedure: TOTAL KNEE ARTHROPLASTY;  Surgeon: Beverley Evalene JONETTA, MD;  Location: [facility] ORS;  Service: Orthopedics;  Laterality: Left;   TOTAL KNEE ARTHROPLASTY Right 06/29/2022   Procedure: TOTAL KNEE ARTHROPLASTY;  Surgeon: Beverley Evalene JONETTA, MD;  Location: [facility] ORS;  Service: Orthopedics;  Laterality: Right;   VIDEO BRONCHOSCOPY  WITH ENDOBRONCHIAL NAVIGATION N/A 04/23/2024   Procedure: VIDEO BRONCHOSCOPY WITH ENDOBRONCHIAL NAVIGATION;  Surgeon: Shelah Lamar RAMAN, MD;  Location: [facility] ENDOSCOPY;  Service: Pulmonary;  Laterality: N/A;  lingular nodule   HPI:  [name] is a 73 y.o. female with medical history significant of newly diagnosed squamous cell carcinoma of the lung, COPD, tobacco use  disorder, hyperlipidemia, coronary artery calcification seen on previous CT scan, depression, type 2 diabetes, GERD, hypertension, migraines, osteoarthritis presented to the ED with sudden onset shortness of breath and concern for allergic reaction after taking BC powder for a headache.  Initial SpO2 65% on room air with dyspnea and facial swelling.  She was given Solu-Medrol , 2 rounds of epinephrine , and Zofran  by EMS.  On arrival to the ED, patient was tachypneic but satting 96% on 6 L [place].  Code stroke activated as patient was noted to have left-sided neglect and weakness.   MRI of the brain was showing widefly scattered small acute infarcts in the right MCA and right MCA/PCA watershed territories.  Patient is from home with a partner whom she is the caretaker for as the partner has MS.   Assessment / Plan / Recommendation Clinical Impression  Cogntiive/linguistic evaluation and motor speech screen were completed.  Cranial nerve exam was completed and remarkable for lingual deviation to the left wtih protrusion. Otherwise, labial, facial and jaw range of motion and strength appeared adequate.  Facial sensation appeared to be intact and she did not endorse a difference in sensation between the right and left side of her face.  Speech was clear and easy to understand. No discernible dysarthria or apraxia were noted.  She completed the Mini Mental State Exam achieving an overall score of 26/30.  She was fully oriented to person, place, time and situation.  She had good immediate recall of 3 novel words.  Given a short delay she was only able to independently recall 2/3 words. Use of semantic cue facilitated recall of third novel word.  Attention to task was adequate.  Language skills appeared to be grossly intact.  She was able to name objects, repeat a short sentence, write a sentence and read/comprehend a sentence.  Mild issues noted for following a 3 step command. She only followed 2 parts accurately.  She  also had issues copying the design.  Informally she was able to provide logical solutions to simple problems.  Given results of this evaluation ST follow up is not indicated.  If we can be of further assistance please reconsult.    SLP Assessment  SLP Recommendation/Assessment: Patient does not need any further Speech Language Pathology Services     Assistance Recommended at Discharge  None           SLP Evaluation Cognition  Overall Cognitive Status: Within Functional Limits for tasks assessed Arousal/Alertness: Awake/alert Orientation Level: Oriented X4 Attention: Sustained Sustained Attention: Appears intact Memory: Impaired Memory Impairment: Decreased recall of new information (66% accurate) Awareness: Appears intact Problem Solving: Appears intact       Comprehension  Auditory Comprehension Overall Auditory Comprehension: Appears within functional limits for tasks assessed Yes/No Questions: Not tested Commands: Within Functional Limits Conversation: Simple Reading Comprehension Reading Status: Within funtional limits    Expression Expression Primary Mode of Expression: Verbal Verbal Expression Overall Verbal Expression: Appears within functional limits for tasks assessed Initiation: No impairment Automatic Speech: Name;Social Response Level of Generative/Spontaneous Verbalization: Conversation Repetition: No impairment Naming: No impairment Pragmatics: No impairment Non-Verbal Means of Communication: Not applicable  Written Expression Dominant Hand: Right Written Expression: Within Functional Limits   Oral / Motor  Oral Motor/Sensory Function Overall Oral Motor/Sensory Function: Mild impairment Facial ROM: Within Functional Limits Facial Symmetry: Within Functional Limits Facial Strength: Within Functional Limits Lingual ROM: Within Functional Limits Lingual Symmetry: Abnormal symmetry left Lingual Strength: Within Functional Limits Mandible: Within  Functional Limits Motor Speech Overall Motor Speech: Appears within functional limits for tasks assessed Respiration: Within functional limits Phonation: Normal Resonance: Within functional limits Articulation: Within functional limitis Intelligibility: Intelligible Motor Planning: Within functional limits Motor Speech Errors: Not applicable           Eleanor Eagles, MA, CCC-SLP Acute Rehab SLP [phone]  Eleanor LOISE Eagles 05/12/2024, 1:23 PM

## 2024-05-12 NOTE — Progress Notes (Signed)
 Progress Note   Patient: [name] FMW:[id number] DOB: 12/24/1950 DOA: 05/11/2024     0 DOS: the patient was seen and examined on 05/12/2024   Brief hospital course: 73yo with h/o newly diagnosed SCC of the lung, COPD with ongoing tobacco use, HLD, depression, DM, and HTN who presented on 10/10 with SOB, possible allergic reaction to [facility] powder.  O2 65% on RA with facial edema, improved sats to 96% on 6L [place] O2.  Given Solumedrol, Epi, and Zofran  with EMS.  Also noted to have L-sided neglect and weakness; code stroke called.  Given Reglan , Albuterol , Pepcid , Mag++.  Neurology consulted and recommended admission to [facility].  Assessment and Plan:  Acute hypoxemic respiratory failure, h/o COPD Patient presented to the ED with sudden onset shortness of breath and concern for severe allergic reaction/anaphylaxis after taking BC powder for a headache Initial SpO2 65% on room air with dyspnea and facial swelling She received Solu-Medrol  and 2 rounds of epinephrine  by EMS and albuterol  neb, IV Pepcid , and IV mag in the ED Currently satting in the high 90s on 6 L [place] and no respiratory distress CTA chest negative for PE Continue treatment with Solu-Medrol  40 mg every 12 hours, DuoNeb every 8 hours, Pulmicort neb twice daily, and albuterol  neb every 2 hours PRN   Acute CVA Patient noticed acute onset of L sided weakness in the ER, also noted to have L-sided neglect Code stroke activated LKW 12:30 AM and outside of window for TNK on arrival CT head with no acute intracranial abnormality CTA head and neck negative for LVO and CT perfusion study showing no evidence of ischemia Appreciate neurology recommendations Admit to [facility], progressive care Plavix 300 mg load followed by Plavix 75 mg daily.  No aspirin  due to concern for severe allergic reaction/anaphylaxis to BC powder as above (discussed with neurology). A1c, fasting lipid panel MRI with widely scattered small acute infarcts in the R MCA  and R MCA/PCA watershed territories with cytotoxic edema and scattered acute petechial hemorrhage CVA appears to be embolic; with negative CTA chest there is concern for cardioembolic source and long-term cardiac monitoring should be considered Frequent neurochecks Echocardiogram pending PT/OT/SLP consulted CIR consulted, as she appears likely to benefit   AKI, resolved Creatinine 1.1 on presentation Given IV fluid hydration with resolution   Avoid nephrotoxic agents Hold home valsartan and hydrochlorothiazide   Type 2 diabetes A1c 7.4, suboptimal but reasonable control Hold Glucophage  Cover with moderate-scale SSI Carb modified diet   Placed on sensitive sliding scale insulin  ACHS.   Hypertension Hold antihypertensives at this time to allow permissive hypertension given acute CVA Takes valsartan/hydrochlorothiazide at home   Newly diagnosed squamous cell carcinoma of the lung Patient was seen by radiation oncology  Both surgery and SBRT are being considered as possible treatment options Patient is supposed to see cardiothoracic surgery next Tuesday, Dr. Kerrin Outpatient follow-up recommended Initiation of therapy is likely to be delayed given her current circumstances   Hyperlipidemia Continue rosuvastatin  Excellent lipid control, does not need additional dosing   Mood disorder Continue amitriptyline  and aripiprazole    Chronic pain/neuropathy Continue gabapentin   Tobacco dependence Still smoking 5-7 cigarettes per day Cessation is essential Will order nicotine patch     Consultants: Rad onc Neurology PT OT SLP TOC team CIR  Procedures: Echocardiogram 10/11  Antibiotics: None      Subjective: Feeling better, on less [place] O2, no further facial edema or SOB.  She continues to have [name] > [name] weakness  with L-sided neglect.  Physical Exam: Vitals:   05/12/24 0759 05/12/24 0830 05/12/24 1142 05/12/24 1205  BP:  108/69 128/78 135/76  Pulse:  79 84  89  Resp:  (!) 21 18 20   Temp: 98.1 F (36.7 C)  98.1 F (36.7 C) 98.2 F (36.8 C)  TempSrc: Oral  Oral Oral  SpO2:  99% 96% 96%     Intake/Output Summary (Last 24 hours) at 05/12/2024 1330 Last data filed at 05/12/2024 0755 Gross per 24 hour  Intake 1000.13 ml  Output --  Net 1000.13 ml   There were no vitals filed for this visit.  Exam:  General:  Appears calm and comfortable and is in NAD Eyes:  normal lids, iris ENT:  grossly normal hearing, lips & tongue, mmm Cardiovascular:  RRR. No LE edema.  Respiratory:   CTA bilaterally with no wheezes/rales/rhonchi.  Normal respiratory effort. Abdomen:  soft, NT, ND, NABS Skin:  no rash or induration seen on limited exam Musculoskeletal:  L hemiparesis, [name] > [name] Psychiatric: blunted mood and affect, speech fluent and appropriate, AOx3 Neurologic:  CN 2-12 grossly intact, [name] > [name] weakness with left-sided neglect  Data Reviewed: I have reviewed the patient's lab results since admission.  Pertinent labs for today include:  Glucose 205 Lipids: 101/45/23/166 WBC 21.6 Hgb 11.8 UDS negative A1c 7.4    Family Communication: None present; I spoke with her domestic partner, [name], [phone]  Disposition: Status is: Inpatient Admit - It is my clinical opinion that admission to INPATIENT is reasonable and necessary because of the expectation that this patient will require hospital care that crosses at least 2 midnights to treat this condition based on the medical complexity of the problems presented.  Given the aforementioned information, the predictability of an adverse outcome is felt to be significant.     Planned Discharge Destination: to be determined    Time spent: 50 minutes  Author: Delon Herald, MD 05/12/2024 1:30 PM  For on call review [email or web address].

## 2024-05-12 NOTE — Progress Notes (Signed)
 VASCULAR LAB    Bilateral lower extremity venous duplex has been performed.  See CV proc for preliminary results.   Crystie Yanko, RVT 05/12/2024, 5:01 PM

## 2024-05-12 NOTE — Progress Notes (Signed)
 STROKE TEAM PROGRESS NOTE   SUBJECTIVE (INTERVAL HISTORY) No family is at the bedside. Pt lying in bed, not in respiratory distress. She stated that she was found to have left sided weakness yesterday in ED, but after that she found out by herself that her right hand also weaker than normal.    OBJECTIVE Temp:  [98.1 F (36.7 C)-98.7 F (37.1 C)] 98.4 F (36.9 C) (10/11 1609) Pulse Rate:  [79-96] 96 (10/11 1609) Cardiac Rhythm: Normal sinus rhythm (10/11 1330) Resp:  [15-24] 21 (10/11 1609) BP: (108-139)/(65-99) 137/73 (10/11 1609) SpO2:  [96 %-100 %] 96 % (10/11 1609) FiO2 (%):  [32 %] 32 % (10/11 1523)  Recent Labs  Lab 05/11/24 2155 05/11/24 2214 05/12/24 0752 05/12/24 1148 05/12/24 1608  GLUCAP 252* 251* 190* 258* 230*   Recent Labs  Lab 05/11/24 1437 05/11/24 1837 05/11/24 1848 05/12/24 0556  NA 140 137 138 136  K 3.6 3.2* 3.1* 4.9  CL 102 99 98 102  CO2 23 23  --  25  GLUCOSE 252* 256* 256* 205*  BUN 18 17 17 16   CREATININE 1.14* 1.15* 1.20* 0.87  CALCIUM  9.7 9.3  --  8.7*   Recent Labs  Lab 05/11/24 1837  AST 27  ALT 12  ALKPHOS 74  BILITOT 0.2  PROT 5.9*  ALBUMIN 3.6   Recent Labs  Lab 05/11/24 1300 05/11/24 1848 05/12/24 0556  WBC 11.7*  --  21.6*  NEUTROABS 7.1  --   --   HGB 14.6 12.9 11.8*  HCT 45.6 38.0 35.7*  MCV 97.6  --  96.2  PLT 273  --  238   No results for input(s): CKTOTAL, CKMB, CKMBINDEX, TROPONINI in the last 168 hours. Recent Labs    05/11/24 1837  LABPROT 14.4  INR 1.1   No results for input(s): COLORURINE, LABSPEC, PHURINE, GLUCOSEU, HGBUR, BILIRUBINUR, KETONESUR, PROTEINUR, UROBILINOGEN, NITRITE, LEUKOCYTESUR in the last 72 hours.  Invalid input(s): APPERANCEUR     Component Value Date/Time   CHOL 101 05/12/2024 0556   TRIG 166 (H) 05/12/2024 0556   HDL 45 05/12/2024 0556   CHOLHDL 2.3 05/12/2024 0556   VLDL 33 05/12/2024 0556   LDLCALC 23 05/12/2024 0556   Lab Results   Component Value Date   HGBA1C 7.4 (H) 05/11/2024      Component Value Date/Time   LABOPIA NEGATIVE 05/11/2024 2203   COCAINSCRNUR NEGATIVE 05/11/2024 2203   LABBENZ NEGATIVE 05/11/2024 2203   AMPHETMU NEGATIVE 05/11/2024 2203   THCU NEGATIVE 05/11/2024 2203   LABBARB NEGATIVE 05/11/2024 2203    Recent Labs  Lab 05/11/24 1837  ETH <15    I have personally reviewed the radiological images below and agree with the radiology interpretations.  VAS US  LOWER EXTREMITY VENOUS (DVT) Result Date: 05/12/2024  Lower Venous DVT Study Patient Name:  HAFSAH HENDLER  Date of Exam:   05/12/2024 Medical Rec #: [id number]        Accession #:    [id number] Date of Birth: 28-Mar-1951         Patient Gender: F Patient Age:   73 years Exam Location:  [facility] Procedure:      VAS US  LOWER EXTREMITY VENOUS (DVT) Referring Phys: ARY Xzaria Teo --------------------------------------------------------------------------------  Indications: Stroke in cancer patient.  Risk Factors: Newly diagnosed squamous cell cancer of the lung. Comparison Study: Prior negative left LEV done 04/15/20 at [facility] Performing Technologist: Alberta Lis RVS  Examination Guidelines: A complete evaluation includes B-mode imaging, spectral Doppler, color  Doppler, and power Doppler as needed of all accessible portions of each vessel. Bilateral testing is considered an integral part of a complete examination. Limited examinations for reoccurring indications may be performed as noted. The reflux portion of the exam is performed with the patient in reverse Trendelenburg.  +---------+---------------+---------+-----------+----------+--------------+ RIGHT    CompressibilityPhasicitySpontaneityPropertiesThrombus Aging +---------+---------------+---------+-----------+----------+--------------+ CFV      Full           Yes      Yes                                 +---------+---------------+---------+-----------+----------+--------------+  SFJ      Full                                                        +---------+---------------+---------+-----------+----------+--------------+ FV Prox  Full           Yes      Yes                                 +---------+---------------+---------+-----------+----------+--------------+ FV Mid   Full           Yes      Yes                                 +---------+---------------+---------+-----------+----------+--------------+ FV DistalFull           Yes      Yes                                 +---------+---------------+---------+-----------+----------+--------------+ PFV      Full           Yes      Yes                                 +---------+---------------+---------+-----------+----------+--------------+ POP      Full                                                        +---------+---------------+---------+-----------+----------+--------------+ PTV      Full                                                        +---------+---------------+---------+-----------+----------+--------------+ PERO     Full                                                        +---------+---------------+---------+-----------+----------+--------------+   +---------+---------------+---------+-----------+----------+--------------+ LEFT     CompressibilityPhasicitySpontaneityPropertiesThrombus Aging +---------+---------------+---------+-----------+----------+--------------+ CFV  Full           Yes      Yes                                 +---------+---------------+---------+-----------+----------+--------------+ SFJ      Full                                                        +---------+---------------+---------+-----------+----------+--------------+ FV Prox  Full           Yes      Yes                                 +---------+---------------+---------+-----------+----------+--------------+ FV Mid   Full                                                         +---------+---------------+---------+-----------+----------+--------------+ FV DistalFull                                                        +---------+---------------+---------+-----------+----------+--------------+ PFV      Full           Yes      Yes                                 +---------+---------------+---------+-----------+----------+--------------+ POP      Full           Yes      Yes                                 +---------+---------------+---------+-----------+----------+--------------+ PTV      Full                                                        +---------+---------------+---------+-----------+----------+--------------+ PERO     Full                                                        +---------+---------------+---------+-----------+----------+--------------+     Summary: BILATERAL: - No evidence of deep vein thrombosis seen in the lower extremities, bilaterally. -No evidence of popliteal cyst, bilaterally.   *See table(s) above for measurements and observations.    Preliminary    MR BRAIN WO CONTRAST Result Date: 05/12/2024 EXAM: MR Brain without Intravenous Contrast. CLINICAL HISTORY: 73 year old female with acute neuro deficit, stroke suspected, presenting yesterday. TECHNIQUE: Magnetic resonance images of  the brain without intravenous contrast in multiple planes. CONTRAST: Without. COMPARISON: Head CT and CTA head and neck 05/11/2024, brain MRI 05/08/2024. FINDINGS: BRAIN: Widely scattered small and patchy areas of diffusion restriction in the right hemisphere correspond to the right MCA territory, and the right MCA/PCA watershed area. Confluent superior perirolandic involvement although postcentral involvement predominates (series 5 image 44). Minimal involvement of the right caudate, basal ganglia otherwise spared. No contralateral left hemisphere or posterior fossa diffusion restriction. T2 and FLAIR hyperintense  cytotoxic edema in the affected areas and there is scattered petechial hemorrhage in the right parietal and occipital lobe areas of involvement, new since 05/08/2024. No malignant hemorrhagic transformation. No intracranial mass effect. No midline shift or extra-axial fluid collection. Preexisting widespread chronic white matter T2 and FLAIR hyperintensity again noted. No cerebellar tonsillar ectopia. The central arterial and venous flow voids are patent. VENTRICLES: No hydrocephalus. ORBITS: The orbits are normal. SINUSES AND MASTOIDS: The sinuses and mastoid air cells are clear. BONES: No acute fracture or focal osseous lesion. IMPRESSION: 1. Widely scattered small Acute infarcts in the right MCA and right MCA/PCA watershed territories. 2. Cytotoxic edema and scattered acute petechial hemorrhage (right parietal and occipital). No malignant hemorrhagic transformation. No intracranial mass effect. 3. Underlying chronic white matter disease. Electronically signed by: Helayne Hurst MD 05/12/2024 10:05 AM EDT RP Workstation: [id number]   CT ANGIO HEAD NECK W WO CM W PERF (CODE STROKE) Result Date: 05/11/2024 EXAM: CTA Head and Neck with Perfusion 05/11/2024 06:08:14 PM TECHNIQUE: CTA of the head and neck was performed without and with the administration of 100 mL of intravenous contrast (iohexol  (OMNIPAQUE ) 350 MG/ML injection 100 mL IOHEXOL  350 MG/ML SOLN). 3D postprocessing with multiplanar reconstructions and MIPs was performed to evaluate the vascular anatomy. Cerebral perfusion analysis using computed tomography with contrast administration, including post-processing of parametric maps with determination of cerebral blood flow, cerebral blood volume, mean transit time and time-to-maximum. Automated exposure control, iterative reconstruction, and/or weight based adjustment of the mA/kV was utilized to reduce the radiation dose to as low as reasonably achievable. COMPARISON: None available CLINICAL HISTORY: Neuro  deficit, acute, stroke suspected. Left sided weakness. FINDINGS: CTA NECK: AORTIC ARCH AND ARCH VESSELS: Common origin of left common carotid artery and a dominant artery is noted. Atherosclerotic changes are present at the aorta and at the origin of the left subclavian artery without aneurysm or focal stenosis. No dissection or arterial injury. No significant stenosis of the brachiocephalic or subclavian arteries. CERVICAL CAROTID ARTERIES: Atherosclerotic changes are present at the right carotid bifurcation and proximal right ICA without focal stenosis. Atherosclerotic changes are present at the left carotid bifurcation and proximal left ICA without focal stenosis. No dissection, arterial injury, or hemodynamically significant stenosis by NASCET criteria. CERVICAL VERTEBRAL ARTERIES: The left vertebral artery is a dominant vessel. Moderate tortuosity is present in the V1 segment of the left vertebral artery without focal stenosis. No dissection, arterial injury, or significant stenosis. LUNGS AND MEDIASTINUM: Unremarkable. SOFT TISSUES: No acute abnormality. BONES: Grade 1 anterolisthesis and moderate right facet degenerative change leads to moderate right foraminal stenosis at C3-C4. CTA HEAD: ANTERIOR CIRCULATION: No significant stenosis of the internal carotid arteries. No significant stenosis of the anterior cerebral arteries. No significant stenosis of the middle cerebral arteries. No aneurysm. POSTERIOR CIRCULATION: No significant stenosis of the posterior cerebral arteries. No significant stenosis of the basilar artery. No significant stenosis of the vertebral arteries. No aneurysm. OTHER: No dural venous sinus thrombosis on this non-dedicated study. CT PERFUSION:  EXAM QUALITY: Exam quality is adequate with diagnostic perfusion maps. No significant motion artifact. Appropriate arterial inflow and venous outflow curves. CORE INFARCT (CBF<30% volume): 0 mL TOTAL HYPOPERFUSION (Tmax>6s volume): 0 mL PENUMBRA:  Mismatch volume: 0 mL IMPRESSION: 1. No acute large vessel occlusion. 2. No hemodynamically significant stenosis or aneurysm in the head or neck vessels. 3. No evidence of ischemia by CT brain perfusion. Electronically signed by: Lonni Necessary MD 05/11/2024 06:23 PM EDT RP Workstation: [id number]   CT HEAD CODE STROKE WO CONTRAST Result Date: 05/11/2024 EXAM: CT HEAD WITHOUT CONTRAST 05/11/2024 06:08:14 PM TECHNIQUE: CT of the head was performed without the administration of intravenous contrast. Automated exposure control, iterative reconstruction, and/or weight based adjustment of the mA/kV was utilized to reduce the radiation dose to as low as reasonably achievable. COMPARISON: None available. CLINICAL HISTORY: Neuro deficit, acute, stroke suspected. Left sided weakness. FINDINGS: BRAIN AND VENTRICLES: Intravascular contrast from CT angio chest of the same day reduces sensitivity for subarachnoid blood. No focal hemorrhage is present. Periventricular and scattered subcortical white matter hypoattenuation is moderately advanced for age. This most likely reflects the sequelae of chronic microvascular ischemia. No evidence of acute infarct. No hydrocephalus. No extra-axial collection. No mass effect or midline shift. ORBITS: No acute abnormality. SINUSES: No acute abnormality. SOFT TISSUES AND SKULL: No acute soft tissue abnormality. No skull fracture. IMPRESSION: 1. No acute intracranial abnormality. Electronically signed by: Lonni Necessary MD 05/11/2024 06:13 PM EDT RP Workstation: [id number]   CT Angio Chest PE W and/or Wo Contrast Result Date: 05/11/2024 CLINICAL DATA:  Pulmonary embolism (PE) suspected, low to intermediate prob, positive D-dimer. Difficulty breathing. Possible allergic reaction. EXAM: CT ANGIOGRAPHY CHEST WITH CONTRAST TECHNIQUE: Multidetector CT imaging of the chest was performed using the standard protocol during bolus administration of intravenous contrast. Multiplanar CT  image reconstructions and MIPs were obtained to evaluate the vascular anatomy. RADIATION DOSE REDUCTION: This exam was performed according to the departmental dose-optimization program which includes automated exposure control, adjustment of the mA and/or kV according to patient size and/or use of iterative reconstruction technique. CONTRAST:  75mL OMNIPAQUE  IOHEXOL  350 MG/ML SOLN COMPARISON:  None Available. FINDINGS: Cardiovascular: Scattered coronary artery and aortic atherosclerosis. Heart is normal size. Aorta is normal caliber. No filling defects in the pulmonary arteries to suggest pulmonary emboli. Mediastinum/Nodes: No mediastinal, hilar, or axillary adenopathy. Trachea and esophagus are unremarkable. 1.6 cm left thyroid  nodule, unchanged since prior study. This has been evaluated on previous imaging. (ref: J Am Coll Radiol. 2015 Feb;12(2): 143-50). Lungs/Pleura: Lingular nodule again noted measuring 1.4 cm in greatest diameter, unchanged. Biopsy fiducial adjacent to the nodule, unchanged since prior PET CT. No new nodules. Bilateral lower lobe airway thickening with areas of mucous plugging and bibasilar atelectasis. No effusions. Upper Abdomen: No acute findings Musculoskeletal: Chest wall soft tissues are unremarkable. No acute bony abnormality. Review of the MIP images confirms the above findings. IMPRESSION: No evidence of pulmonary embolus. Stable lingular nodule, 1.4 cm. Airway thickening with bibasilar atelectasis. Coronary artery disease. Aortic Atherosclerosis (ICD10-I70.0). Electronically Signed   By: Franky Crease M.D.   On: 05/11/2024 17:31   DG Chest Port 1 View Result Date: 05/11/2024 EXAM: 1 VIEW(S) XRAY OF THE CHEST 05/11/2024 01:50:00 PM COMPARISON: 04/23/2024 CLINICAL HISTORY: Allergic reaction [id number]. Per chart: Pt BIB EMS from Home due to sudden difficulty breathing after take her medication BC for migraines possible allergic reaction; initially SpO2 65% on room air. Hx of Lung  Cancer and COPD, no O2 at home. Pt  has an appointment with Cancer center ; at 2:30. FINDINGS: LUNGS AND PLEURA: Vague nodule with fiducial marker in left mid lung. No pulmonary edema. No pleural effusion. No pneumothorax. HEART AND MEDIASTINUM: No acute abnormality of the cardiac and mediastinal silhouettes. BONES AND SOFT TISSUES: No acute osseous abnormality. IMPRESSION: 1. No acute cardiopulmonary process. 2. Left mid-lung pulmonary   fiducial marker. Electronically signed by: Dayne Hassell MD 05/11/2024 02:10 PM EDT RP Workstation: [id number]   NM PET Image Restage (PS) Skull Base to Thigh (F-18 FDG) Result Date: 05/10/2024 CLINICAL DATA:  Subsequent treatment strategy for restaging of non-small-cell lung cancer. EXAM: NUCLEAR MEDICINE PET SKULL BASE TO THIGH TECHNIQUE: 8.9 mCi F-18 FDG was injected intravenously. Full-ring PET imaging was performed from the skull base to thigh after the radiotracer. CT data was obtained and used for attenuation correction and anatomic localization. Fasting blood glucose: 139 mg/dl COMPARISON:  91/79/7974 lung cancer screening CT. PET of 12/20/2023 also reviewed. FINDINGS: Mediastinal blood pool activity: SUV max 3.1 Liver activity: SUV max NA NECK: No areas of abnormal hypermetabolism. Incidental CT findings: Bilateral carotid atherosclerosis. A left-sided 1.6 cm thyroid  nodule has been evaluated on 06/03/2022 ultrasound. No cervical adenopathy. CHEST: No thoracic nodal hypermetabolism. The lingular nodule is hypermetabolic. 1.6 x 1.3 cm and a S.U.V. max of 4.6 cm 36/7. Adjacent biopsy fiducial. Incidental CT findings: Centrilobular emphysema. Aortic and coronary artery calcification. ABDOMEN/PELVIS: No abdominopelvic parenchymal or nodal hypermetabolism. Incidental CT findings: Normal adrenal glands. Hepatic morphology again suspicious for mild cirrhosis. Cholecystectomy. Fat containing ventral abdominal wall laxity. Hysterectomy. SKELETON: No abnormal marrow activity.  Incidental CT findings: Lumbosacral spine fixation. IMPRESSION: 1. Hypermetabolic 1.6 cm lingular nodule, without thoracic nodal or distant metastasis. T1b, N0, M0. Stage 1A. 2. Incidental findings, including: Aortic atherosclerosis (ICD10-I70.0), coronary artery atherosclerosis and emphysema (ICD10-J43.9). Probable mild cirrhosis. Electronically Signed   By: Rockey Kilts M.D.   On: 05/10/2024 15:08   MR BRAIN W WO CONTRAST Result Date: 05/09/2024 CLINICAL DATA:  Lung cancer EXAM: MRI HEAD WITHOUT AND WITH CONTRAST TECHNIQUE: Multiplanar, multiecho pulse sequences of the brain and surrounding structures were obtained without and with intravenous contrast. CONTRAST:  8mL GADAVIST GADOBUTROL 1 MMOL/ML IV SOLN COMPARISON:  None Available. FINDINGS: MRI brain: There are multiple foci of T2 hyperintensity in the cerebral white matter. These do not have restricted diffusion. The signal in the brain parenchyma is normal. No abnormal enhancement. There is no acute or chronic infarct. The ventricles are normal. No mass lesion. There are normal flow signals in the carotid arteries and basilar artery. No significant bone marrow signal abnormality. No significant abnormality in the paranasal sinuses or soft tissues. IMPRESSION: No evidence of metastatic disease. There are multiple T2 hyperintensities within the cerebral white matter. These abnormalities are nonspecific and of uncertain etiology or clinical significance. They are more common in older patients and patients with risk factors for vascular disease. Electronically Signed   By: Nancyann Burns M.D.   On: 05/09/2024 09:22   DG Chest Port 1 View Result Date: 04/23/2024 CLINICAL DATA:  Status post bronchoscopy and biopsy. EXAM: PORTABLE CHEST 1 VIEW COMPARISON:  11/16/2016 and CT chest 03/21/2024. FINDINGS: Trachea is midline. Heart size normal. Fiducial marker in lingula, associated with a known nodule. Lungs are low in volume but otherwise clear. No pleural fluid.  No pneumothorax. IMPRESSION: 1. No pneumothorax. 2. Placement of a fiducial marker at site of a known lingular nodule. Electronically Signed   By: Newell Eke M.D.   On: 04/23/2024  13:38   DG C-ARM BRONCHOSCOPY Result Date: 04/23/2024 C-ARM BRONCHOSCOPY: Fluoroscopy was utilized by the requesting physician.  No radiographic interpretation.     PHYSICAL EXAM  Temp:  [98.1 F (36.7 C)-98.7 F (37.1 C)] 98.4 F (36.9 C) (10/11 1609) Pulse Rate:  [79-96] 96 (10/11 1609) Resp:  [15-24] 21 (10/11 1609) BP: (108-139)/(65-99) 137/73 (10/11 1609) SpO2:  [96 %-100 %] 96 % (10/11 1609) FiO2 (%):  [32 %] 32 % (10/11 1523)  General - Well nourished, well developed, in no apparent distress.  Ophthalmologic - fundi not visualized due to noncooperation.  Cardiovascular - Regular rhythm and rate.  Neuro - awake, alert, eyes open, orientated to age, place, time. No aphasia, fluent language, following all simple commands. Able to name and repeat. No gaze palsy, tracking bilaterally, visual field full. No facial droop. Tongue midline. [name] drift to bed within 10 sec, but hand griping 5/5. RUE proximal 5/5, but hand griping 4/5. RLE 5/5, [name] proximal 5/5 and ankle DF/PF 4/5. Sensation decreased on the [name] but symmetrical BLEs, left FTN mild dysmetria but not out of proportion to the weakness, gait not tested.    ASSESSMENT/PLAN Ms. ABYGAIL GALENO is a 73 y.o. female with history of recent diagnosed lung cancer, smoker, HLD, CAD, HTN, DM, migraine admitted for SOB, HA, dizziness with low O2 sat. Found to have left sided weakness by EDP. No TNK given due to outside window.    Stroke:  right MCA, ACA and MCA/PCA infarcts embolic pattern secondary to embolic source CT no acute finding CTA head and neck unremarkable CTP neg MRI  right MCA, ACA and MCA/PCA infarcts 2D Echo  pending LE venous doppler no DVT Will recommend loop recorder before discharge to rule out afib (pt father and sister both had  Afib and stroke) LDL 23 HgbA1c 7.4 UDS neg Lovenox for VTE prophylaxis No antithrombotic prior to admission, now on clopidogrel 75 mg daily. ASA listed in allergy Patient counseled to be compliant with her antithrombotic medications Ongoing aggressive stroke risk factor management Therapy recommendations:  pending Disposition:  pending  Lung cancer SOB Recently diagnosed with lung cancer stage I Smoker with SOB/COPD, on solumedrol CTA chest no PE No DVT to suggest hypercoagulable state Follow up with oncology  Diabetes HgbA1c 7.4 goal < 7.0 Controlled CBG monitoring SSI DM education and close PCP follow up  Hypertension Stable Long term BP goal normotensive   Hyperlipidemia Home meds:  crestor  5  LDL 23, goal < 70 Now on crestor  5 Continue statin at discharge  Tobacco abuse Current smoker Smoking cessation counseling provided Pt is willing to quit  Other Stroke Risk Factors Advanced age Coronary artery disease Migraines Family hx of afib and stroke (father and sister)  Other Active Problems Leukocytosis 11.7->21.6 - due to steroids treatment for COPD  Hospital day # 0    Ary Cummins, MD PhD Stroke Neurology 05/12/2024 7:28 PM    To contact Stroke Continuity provider, please refer to [email or web address]. After hours, contact General Neurology

## 2024-05-13 ENCOUNTER — Inpatient Hospital Stay (HOSPITAL_COMMUNITY)

## 2024-05-13 DIAGNOSIS — J9601 Acute respiratory failure with hypoxia: Secondary | ICD-10-CM | POA: Diagnosis not present

## 2024-05-13 DIAGNOSIS — I6389 Other cerebral infarction: Secondary | ICD-10-CM | POA: Diagnosis not present

## 2024-05-13 DIAGNOSIS — I6340 Cerebral infarction due to embolism of unspecified cerebral artery: Secondary | ICD-10-CM | POA: Diagnosis not present

## 2024-05-13 DIAGNOSIS — F17210 Nicotine dependence, cigarettes, uncomplicated: Secondary | ICD-10-CM | POA: Diagnosis not present

## 2024-05-13 LAB — GLUCOSE, CAPILLARY
Glucose-Capillary: 302 mg/dL — ABNORMAL HIGH (ref 70–99)
Glucose-Capillary: 222 mg/dL — ABNORMAL HIGH (ref 70–99)
Glucose-Capillary: 300 mg/dL — ABNORMAL HIGH (ref 70–99)
Glucose-Capillary: 269 mg/dL — ABNORMAL HIGH (ref 70–99)

## 2024-05-13 LAB — ECHOCARDIOGRAM COMPLETE
AR max vel: 2.1 cm2
AV Area VTI: 2.05 cm2
AV Area mean vel: 2.02 cm2
AV Mean grad: 3 mmHg
AV Peak grad: 5 mmHg
Ao pk vel: 1.12 m/s
Area-P 1/2: 3.5 cm2
MV VTI: 1.19 cm2
S' Lateral: 2.6 cm

## 2024-05-13 MED ORDER — IPRATROPIUM-ALBUTEROL 0.5-2.5 (3) MG/3ML IN SOLN: 3.0000 mL | RESPIRATORY_TRACT | Status: DC | PRN

## 2024-05-13 MED ORDER — ONDANSETRON HCL 4 MG/2ML IJ SOLN
4.0000 mg | Freq: Four times a day (QID) | INTRAMUSCULAR | Status: DC | PRN
  Administered 2024-05-13: 4 mg via INTRAVENOUS
  Filled 2024-05-13: qty 2

## 2024-05-13 MED ORDER — LABETALOL HCL 5 MG/ML IV SOLN: 10.0000 mg | INTRAVENOUS | Status: DC | PRN

## 2024-05-13 MED ORDER — IPRATROPIUM-ALBUTEROL 0.5-2.5 (3) MG/3ML IN SOLN
3.0000 mL | Freq: Four times a day (QID) | RESPIRATORY_TRACT | Status: DC
  Administered 2024-05-13 – 2024-05-14 (×2): 3 mL via RESPIRATORY_TRACT
  Filled 2024-05-13: qty 3

## 2024-05-13 MED ORDER — GLUCAGON HCL RDNA (DIAGNOSTIC) 1 MG IJ SOLR: 1.0000 mg | INTRAMUSCULAR | Status: DC | PRN

## 2024-05-13 NOTE — Progress Notes (Signed)
 STROKE TEAM PROGRESS NOTE   SUBJECTIVE (INTERVAL HISTORY) PT is at the bedside. Pt lying in bed, stated that she got up quickly this morning, feel some vertigo. Otherwise no complains. She still has mild [name] weakness but R hand griping much improved.    OBJECTIVE Temp:  [98.2 F (36.8 C)-98.6 F (37 C)] 98.4 F (36.9 C) (10/12 0851) Pulse Rate:  [84-99] 87 (10/12 0325) Cardiac Rhythm: Other (Comment) (10/12 0755) Resp:  [19-21] 19 (10/12 0851) BP: (121-144)/(68-81) 132/80 (10/12 0851) SpO2:  [91 %-99 %] 98 % (10/12 0325) FiO2 (%):  [28 %-32 %] 28 % (10/11 2344)  Recent Labs  Lab 05/12/24 0752 05/12/24 1148 05/12/24 1608 05/12/24 2130 05/13/24 0616  GLUCAP 190* 258* 230* 150* 302*   Recent Labs  Lab 05/11/24 1437 05/11/24 1837 05/11/24 1848 05/12/24 0556  NA 140 137 138 136  K 3.6 3.2* 3.1* 4.9  CL 102 99 98 102  CO2 23 23  --  25  GLUCOSE 252* 256* 256* 205*  BUN 18 17 17 16   CREATININE 1.14* 1.15* 1.20* 0.87  CALCIUM  9.7 9.3  --  8.7*   Recent Labs  Lab 05/11/24 1837  AST 27  ALT 12  ALKPHOS 74  BILITOT 0.2  PROT 5.9*  ALBUMIN 3.6   Recent Labs  Lab 05/11/24 1300 05/11/24 1848 05/12/24 0556  WBC 11.7*  --  21.6*  NEUTROABS 7.1  --   --   HGB 14.6 12.9 11.8*  HCT 45.6 38.0 35.7*  MCV 97.6  --  96.2  PLT 273  --  238   No results for input(s): CKTOTAL, CKMB, CKMBINDEX, TROPONINI in the last 168 hours. Recent Labs    05/11/24 1837  LABPROT 14.4  INR 1.1   No results for input(s): COLORURINE, LABSPEC, PHURINE, GLUCOSEU, HGBUR, BILIRUBINUR, KETONESUR, PROTEINUR, UROBILINOGEN, NITRITE, LEUKOCYTESUR in the last 72 hours.  Invalid input(s): APPERANCEUR     Component Value Date/Time   CHOL 101 05/12/2024 0556   TRIG 166 (H) 05/12/2024 0556   HDL 45 05/12/2024 0556   CHOLHDL 2.3 05/12/2024 0556   VLDL 33 05/12/2024 0556   LDLCALC 23 05/12/2024 0556   Lab Results  Component Value Date   HGBA1C 7.4 (H)  05/11/2024      Component Value Date/Time   LABOPIA NEGATIVE 05/11/2024 2203   COCAINSCRNUR NEGATIVE 05/11/2024 2203   LABBENZ NEGATIVE 05/11/2024 2203   AMPHETMU NEGATIVE 05/11/2024 2203   THCU NEGATIVE 05/11/2024 2203   LABBARB NEGATIVE 05/11/2024 2203    Recent Labs  Lab 05/11/24 1837  ETH <15    I have personally reviewed the radiological images below and agree with the radiology interpretations.  VAS US  LOWER EXTREMITY VENOUS (DVT) Result Date: 05/12/2024  Lower Venous DVT Study Patient Name:  ZETHA KUHAR  Date of Exam:   05/12/2024 Medical Rec #: [id number]        Accession #:    [id number] Date of Birth: [birth year]         Patient Gender: F Patient Age:   73 years Exam Location:  [facility] Procedure:      VAS US  LOWER EXTREMITY VENOUS (DVT) Referring Phys: ARY Cerenity Goshorn --------------------------------------------------------------------------------  Indications: Stroke in cancer patient.  Risk Factors: Newly diagnosed squamous cell cancer of the lung. Comparison Study: Prior negative left LEV done 04/15/20 at [facility] Performing Technologist: Alberta Lis RVS  Examination Guidelines: A complete evaluation includes B-mode imaging, spectral Doppler, color Doppler, and power Doppler as needed of all accessible  portions of each vessel. Bilateral testing is considered an integral part of a complete examination. Limited examinations for reoccurring indications may be performed as noted. The reflux portion of the exam is performed with the patient in reverse Trendelenburg.  +---------+---------------+---------+-----------+----------+--------------+ RIGHT    CompressibilityPhasicitySpontaneityPropertiesThrombus Aging +---------+---------------+---------+-----------+----------+--------------+ CFV      Full           Yes      Yes                                 +---------+---------------+---------+-----------+----------+--------------+ SFJ      Full                                                         +---------+---------------+---------+-----------+----------+--------------+ FV Prox  Full           Yes      Yes                                 +---------+---------------+---------+-----------+----------+--------------+ FV Mid   Full           Yes      Yes                                 +---------+---------------+---------+-----------+----------+--------------+ FV DistalFull           Yes      Yes                                 +---------+---------------+---------+-----------+----------+--------------+ PFV      Full           Yes      Yes                                 +---------+---------------+---------+-----------+----------+--------------+ POP      Full                                                        +---------+---------------+---------+-----------+----------+--------------+ PTV      Full                                                        +---------+---------------+---------+-----------+----------+--------------+ PERO     Full                                                        +---------+---------------+---------+-----------+----------+--------------+   +---------+---------------+---------+-----------+----------+--------------+ LEFT     CompressibilityPhasicitySpontaneityPropertiesThrombus Aging +---------+---------------+---------+-----------+----------+--------------+ CFV      Full  Yes      Yes                                 +---------+---------------+---------+-----------+----------+--------------+ SFJ      Full                                                        +---------+---------------+---------+-----------+----------+--------------+ FV Prox  Full           Yes      Yes                                 +---------+---------------+---------+-----------+----------+--------------+ FV Mid   Full                                                         +---------+---------------+---------+-----------+----------+--------------+ FV DistalFull                                                        +---------+---------------+---------+-----------+----------+--------------+ PFV      Full           Yes      Yes                                 +---------+---------------+---------+-----------+----------+--------------+ POP      Full           Yes      Yes                                 +---------+---------------+---------+-----------+----------+--------------+ PTV      Full                                                        +---------+---------------+---------+-----------+----------+--------------+ PERO     Full                                                        +---------+---------------+---------+-----------+----------+--------------+     Summary: BILATERAL: - No evidence of deep vein thrombosis seen in the lower extremities, bilaterally. -No evidence of popliteal cyst, bilaterally.   *See table(s) above for measurements and observations. Electronically signed by Gaile New MD on 05/12/2024 at 10:12:34 PM.    Final    MR BRAIN WO CONTRAST Result Date: 05/12/2024 EXAM: MR Brain without Intravenous Contrast. CLINICAL HISTORY: 73 year old female with acute neuro deficit, stroke suspected, presenting yesterday. TECHNIQUE: Magnetic resonance images of  the brain without intravenous contrast in multiple planes. CONTRAST: Without. COMPARISON: Head CT and CTA head and neck 05/11/2024, brain MRI 05/08/2024. FINDINGS: BRAIN: Widely scattered small and patchy areas of diffusion restriction in the right hemisphere correspond to the right MCA territory, and the right MCA/PCA watershed area. Confluent superior perirolandic involvement although postcentral involvement predominates (series 5 image 44). Minimal involvement of the right caudate, basal ganglia otherwise spared. No contralateral left hemisphere or posterior fossa diffusion  restriction. T2 and FLAIR hyperintense cytotoxic edema in the affected areas and there is scattered petechial hemorrhage in the right parietal and occipital lobe areas of involvement, new since 05/08/2024. No malignant hemorrhagic transformation. No intracranial mass effect. No midline shift or extra-axial fluid collection. Preexisting widespread chronic white matter T2 and FLAIR hyperintensity again noted. No cerebellar tonsillar ectopia. The central arterial and venous flow voids are patent. VENTRICLES: No hydrocephalus. ORBITS: The orbits are normal. SINUSES AND MASTOIDS: The sinuses and mastoid air cells are clear. BONES: No acute fracture or focal osseous lesion. IMPRESSION: 1. Widely scattered small Acute infarcts in the right MCA and right MCA/PCA watershed territories. 2. Cytotoxic edema and scattered acute petechial hemorrhage (right parietal and occipital). No malignant hemorrhagic transformation. No intracranial mass effect. 3. Underlying chronic white matter disease. Electronically signed by: Helayne Hurst MD 05/12/2024 10:05 AM EDT RP Workstation: [id number]   CT ANGIO HEAD NECK W WO CM W PERF (CODE STROKE) Result Date: 05/11/2024 EXAM: CTA Head and Neck with Perfusion 05/11/2024 06:08:14 PM TECHNIQUE: CTA of the head and neck was performed without and with the administration of 100 mL of intravenous contrast (iohexol  (OMNIPAQUE ) 350 MG/ML injection 100 mL IOHEXOL  350 MG/ML SOLN). 3D postprocessing with multiplanar reconstructions and MIPs was performed to evaluate the vascular anatomy. Cerebral perfusion analysis using computed tomography with contrast administration, including post-processing of parametric maps with determination of cerebral blood flow, cerebral blood volume, mean transit time and time-to-maximum. Automated exposure control, iterative reconstruction, and/or weight based adjustment of the mA/kV was utilized to reduce the radiation dose to as low as reasonably achievable. COMPARISON:  None available CLINICAL HISTORY: Neuro deficit, acute, stroke suspected. Left sided weakness. FINDINGS: CTA NECK: AORTIC ARCH AND ARCH VESSELS: Common origin of left common carotid artery and a dominant artery is noted. Atherosclerotic changes are present at the aorta and at the origin of the left subclavian artery without aneurysm or focal stenosis. No dissection or arterial injury. No significant stenosis of the brachiocephalic or subclavian arteries. CERVICAL CAROTID ARTERIES: Atherosclerotic changes are present at the right carotid bifurcation and proximal right ICA without focal stenosis. Atherosclerotic changes are present at the left carotid bifurcation and proximal left ICA without focal stenosis. No dissection, arterial injury, or hemodynamically significant stenosis by NASCET criteria. CERVICAL VERTEBRAL ARTERIES: The left vertebral artery is a dominant vessel. Moderate tortuosity is present in the V1 segment of the left vertebral artery without focal stenosis. No dissection, arterial injury, or significant stenosis. LUNGS AND MEDIASTINUM: Unremarkable. SOFT TISSUES: No acute abnormality. BONES: Grade 1 anterolisthesis and moderate right facet degenerative change leads to moderate right foraminal stenosis at C3-C4. CTA HEAD: ANTERIOR CIRCULATION: No significant stenosis of the internal carotid arteries. No significant stenosis of the anterior cerebral arteries. No significant stenosis of the middle cerebral arteries. No aneurysm. POSTERIOR CIRCULATION: No significant stenosis of the posterior cerebral arteries. No significant stenosis of the basilar artery. No significant stenosis of the vertebral arteries. No aneurysm. OTHER: No dural venous sinus thrombosis on this non-dedicated study. CT PERFUSION:  EXAM QUALITY: Exam quality is adequate with diagnostic perfusion maps. No significant motion artifact. Appropriate arterial inflow and venous outflow curves. CORE INFARCT (CBF<30% volume): 0 mL TOTAL  HYPOPERFUSION (Tmax>6s volume): 0 mL PENUMBRA: Mismatch volume: 0 mL IMPRESSION: 1. No acute large vessel occlusion. 2. No hemodynamically significant stenosis or aneurysm in the head or neck vessels. 3. No evidence of ischemia by CT brain perfusion. Electronically signed by: Lonni Necessary MD 05/11/2024 06:23 PM EDT RP Workstation: [id number]   CT HEAD CODE STROKE WO CONTRAST Result Date: 05/11/2024 EXAM: CT HEAD WITHOUT CONTRAST 05/11/2024 06:08:14 PM TECHNIQUE: CT of the head was performed without the administration of intravenous contrast. Automated exposure control, iterative reconstruction, and/or weight based adjustment of the mA/kV was utilized to reduce the radiation dose to as low as reasonably achievable. COMPARISON: None available. CLINICAL HISTORY: Neuro deficit, acute, stroke suspected. Left sided weakness. FINDINGS: BRAIN AND VENTRICLES: Intravascular contrast from CT angio chest of the same day reduces sensitivity for subarachnoid blood. No focal hemorrhage is present. Periventricular and scattered subcortical white matter hypoattenuation is moderately advanced for age. This most likely reflects the sequelae of chronic microvascular ischemia. No evidence of acute infarct. No hydrocephalus. No extra-axial collection. No mass effect or midline shift. ORBITS: No acute abnormality. SINUSES: No acute abnormality. SOFT TISSUES AND SKULL: No acute soft tissue abnormality. No skull fracture. IMPRESSION: 1. No acute intracranial abnormality. Electronically signed by: Lonni Necessary MD 05/11/2024 06:13 PM EDT RP Workstation: [id number]   CT Angio Chest PE W and/or Wo Contrast Result Date: 05/11/2024 CLINICAL DATA:  Pulmonary embolism (PE) suspected, low to intermediate prob, positive D-dimer. Difficulty breathing. Possible allergic reaction. EXAM: CT ANGIOGRAPHY CHEST WITH CONTRAST TECHNIQUE: Multidetector CT imaging of the chest was performed using the standard protocol during bolus  administration of intravenous contrast. Multiplanar CT image reconstructions and MIPs were obtained to evaluate the vascular anatomy. RADIATION DOSE REDUCTION: This exam was performed according to the departmental dose-optimization program which includes automated exposure control, adjustment of the mA and/or kV according to patient size and/or use of iterative reconstruction technique. CONTRAST:  75mL OMNIPAQUE  IOHEXOL  350 MG/ML SOLN COMPARISON:  None Available. FINDINGS: Cardiovascular: Scattered coronary artery and aortic atherosclerosis. Heart is normal size. Aorta is normal caliber. No filling defects in the pulmonary arteries to suggest pulmonary emboli. Mediastinum/Nodes: No mediastinal, hilar, or axillary adenopathy. Trachea and esophagus are unremarkable. 1.6 cm left thyroid  nodule, unchanged since prior study. This has been evaluated on previous imaging. (ref: J Am Coll Radiol. 2015 Feb;12(2): 143-50). Lungs/Pleura: Lingular nodule again noted measuring 1.4 cm in greatest diameter, unchanged. Biopsy fiducial adjacent to the nodule, unchanged since prior PET CT. No new nodules. Bilateral lower lobe airway thickening with areas of mucous plugging and bibasilar atelectasis. No effusions. Upper Abdomen: No acute findings Musculoskeletal: Chest wall soft tissues are unremarkable. No acute bony abnormality. Review of the MIP images confirms the above findings. IMPRESSION: No evidence of pulmonary embolus. Stable lingular nodule, 1.4 cm. Airway thickening with bibasilar atelectasis. Coronary artery disease. Aortic Atherosclerosis (ICD10-I70.0). Electronically Signed   By: Franky Crease M.D.   On: 05/11/2024 17:31   DG Chest Port 1 View Result Date: 05/11/2024 EXAM: 1 VIEW(S) XRAY OF THE CHEST 05/11/2024 01:50:00 PM COMPARISON: 04/23/2024 CLINICAL HISTORY: Allergic reaction [id number]. Per chart: Pt BIB EMS from Home due to sudden difficulty breathing after take her medication BC for migraines possible allergic  reaction; initially SpO2 65% on room air. Hx of Lung Cancer and COPD, no O2 at home. Pt  has an appointment with Cancer center ; at 2:30. FINDINGS: LUNGS AND PLEURA: Vague nodule with fiducial marker in left mid lung. No pulmonary edema. No pleural effusion. No pneumothorax. HEART AND MEDIASTINUM: No acute abnormality of the cardiac and mediastinal silhouettes. BONES AND SOFT TISSUES: No acute osseous abnormality. IMPRESSION: 1. No acute cardiopulmonary process. 2. Left mid-lung pulmonary   fiducial marker. Electronically signed by: Dayne Hassell MD 05/11/2024 02:10 PM EDT RP Workstation: [id number]   NM PET Image Restage (PS) Skull Base to Thigh (F-18 FDG) Result Date: 05/10/2024 CLINICAL DATA:  Subsequent treatment strategy for restaging of non-small-cell lung cancer. EXAM: NUCLEAR MEDICINE PET SKULL BASE TO THIGH TECHNIQUE: 8.9 mCi F-18 FDG was injected intravenously. Full-ring PET imaging was performed from the skull base to thigh after the radiotracer. CT data was obtained and used for attenuation correction and anatomic localization. Fasting blood glucose: 139 mg/dl COMPARISON:  91/79/7974 lung cancer screening CT. PET of 12/20/2023 also reviewed. FINDINGS: Mediastinal blood pool activity: SUV max 3.1 Liver activity: SUV max NA NECK: No areas of abnormal hypermetabolism. Incidental CT findings: Bilateral carotid atherosclerosis. A left-sided 1.6 cm thyroid  nodule has been evaluated on 06/03/2022 ultrasound. No cervical adenopathy. CHEST: No thoracic nodal hypermetabolism. The lingular nodule is hypermetabolic. 1.6 x 1.3 cm and a S.U.V. max of 4.6 cm 36/7. Adjacent biopsy fiducial. Incidental CT findings: Centrilobular emphysema. Aortic and coronary artery calcification. ABDOMEN/PELVIS: No abdominopelvic parenchymal or nodal hypermetabolism. Incidental CT findings: Normal adrenal glands. Hepatic morphology again suspicious for mild cirrhosis. Cholecystectomy. Fat containing ventral abdominal wall laxity.  Hysterectomy. SKELETON: No abnormal marrow activity. Incidental CT findings: Lumbosacral spine fixation. IMPRESSION: 1. Hypermetabolic 1.6 cm lingular nodule, without thoracic nodal or distant metastasis. T1b, N0, M0. Stage 1A. 2. Incidental findings, including: Aortic atherosclerosis (ICD10-I70.0), coronary artery atherosclerosis and emphysema (ICD10-J43.9). Probable mild cirrhosis. Electronically Signed   By: Rockey Kilts M.D.   On: 05/10/2024 15:08   MR BRAIN W WO CONTRAST Result Date: 05/09/2024 CLINICAL DATA:  Lung cancer EXAM: MRI HEAD WITHOUT AND WITH CONTRAST TECHNIQUE: Multiplanar, multiecho pulse sequences of the brain and surrounding structures were obtained without and with intravenous contrast. CONTRAST:  8mL GADAVIST GADOBUTROL 1 MMOL/ML IV SOLN COMPARISON:  None Available. FINDINGS: MRI brain: There are multiple foci of T2 hyperintensity in the cerebral white matter. These do not have restricted diffusion. The signal in the brain parenchyma is normal. No abnormal enhancement. There is no acute or chronic infarct. The ventricles are normal. No mass lesion. There are normal flow signals in the carotid arteries and basilar artery. No significant bone marrow signal abnormality. No significant abnormality in the paranasal sinuses or soft tissues. IMPRESSION: No evidence of metastatic disease. There are multiple T2 hyperintensities within the cerebral white matter. These abnormalities are nonspecific and of uncertain etiology or clinical significance. They are more common in older patients and patients with risk factors for vascular disease. Electronically Signed   By: Nancyann Burns M.D.   On: 05/09/2024 09:22   DG Chest Port 1 View Result Date: 04/23/2024 CLINICAL DATA:  Status post bronchoscopy and biopsy. EXAM: PORTABLE CHEST 1 VIEW COMPARISON:  11/16/2016 and CT chest 03/21/2024. FINDINGS: Trachea is midline. Heart size normal. Fiducial marker in lingula, associated with a known nodule. Lungs are  low in volume but otherwise clear. No pleural fluid. No pneumothorax. IMPRESSION: 1. No pneumothorax. 2. Placement of a fiducial marker at site of a known lingular nodule. Electronically Signed   By: Newell Eke M.D.   On: 04/23/2024  13:38   DG C-ARM BRONCHOSCOPY Result Date: 04/23/2024 C-ARM BRONCHOSCOPY: Fluoroscopy was utilized by the requesting physician.  No radiographic interpretation.     PHYSICAL EXAM  Temp:  [98.2 F (36.8 C)-98.6 F (37 C)] 98.4 F (36.9 C) (10/12 0851) Pulse Rate:  [84-99] 87 (10/12 0325) Resp:  [19-21] 19 (10/12 0851) BP: (121-144)/(68-81) 132/80 (10/12 0851) SpO2:  [91 %-99 %] 98 % (10/12 0325) FiO2 (%):  [28 %-32 %] 28 % (10/11 2344)  General - Well nourished, well developed, in no apparent distress.  Ophthalmologic - fundi not visualized due to noncooperation.  Cardiovascular - Regular rhythm and rate.  Neuro - awake, alert, eyes open, orientated to age, place, time. No aphasia, fluent language, following all simple commands. Able to name and repeat. No gaze palsy, tracking bilaterally, visual field full. No facial droop. Tongue midline. [name] drift to bed within 10 sec, but hand griping 5/5. RUE and RLE 5/5, [name] proximal 5/5 and ankle DF/PF 4/5. Sensation decreased on the [name] but symmetrical BLEs, left FTN mild dysmetria but not out of proportion to the weakness, gait not tested.    ASSESSMENT/PLAN Ms. BOBBE QUILTER is a 73 y.o. female with history of recent diagnosed lung cancer, smoker, HLD, CAD, HTN, DM, migraine admitted for SOB, HA, dizziness with low O2 sat. Found to have left sided weakness by EDP. No TNK given due to outside window.    Stroke:  right MCA, ACA and MCA/PCA infarcts embolic pattern secondary to embolic source CT no acute finding CTA head and neck unremarkable CTP neg MRI  right MCA, ACA and MCA/PCA infarcts 2D Echo  pending LE venous doppler no DVT Will recommend loop recorder before discharge to rule out afib (pt mom  and sister both had Afib and stroke) LDL 23 HgbA1c 7.4 UDS neg Lovenox for VTE prophylaxis No antithrombotic prior to admission, now on clopidogrel 75 mg daily. ASA listed in allergy Patient counseled to be compliant with her antithrombotic medications Ongoing aggressive stroke risk factor management Therapy recommendations:  CIR Disposition:  pending  Lung cancer SOB Recently diagnosed with lung cancer stage IA MRI brain no mets Smoker with SOB/COPD, on solumedrol CTA chest no PE No DVT to suggest hypercoagulable state at this time Follow up with oncology  Diabetes HgbA1c 7.4 goal < 7.0 Controlled CBG monitoring SSI DM education and close PCP follow up  Hypertension Stable Long term BP goal normotensive   Hyperlipidemia Home meds:  crestor  5  LDL 23, goal < 70 Now on crestor  5 Continue statin at discharge  Tobacco abuse Current smoker Smoking cessation counseling provided Pt is willing to quit  Other Stroke Risk Factors Advanced age Coronary artery disease Migraines Family hx of afib and stroke (mom and sister)  Other Active Problems Leukocytosis 11.7->21.6 - due to steroids treatment for COPD  Hospital day # 1    Ary Cummins, MD PhD Stroke Neurology 05/13/2024 12:03 PM    To contact Stroke Continuity provider, please refer to [email or web address]. After hours, contact General Neurology

## 2024-05-13 NOTE — Progress Notes (Signed)
 PROGRESS NOTE    [name]  FMW:[id number] DOB: 11/12/1950 DOA: 05/11/2024 PCP: [name], FNP    Brief Narrative:   73 year old with history of SCC of the lung, COPD with ongoing tobacco use, HLD, depression, DM2, HTN presented with shortness of breath possible allergic reaction to BC powder.  Noted to have hypoxia and facial edema.  Patient was given Solu-Medrol , epinephrine  and Zofran  with EMS.  Also noted to have left-sided neglect with weakness.  Code stroke was activated and eventually workup was consistent with multifocal infarct, likely embolic source.  Routine workup was performed by neurology team and eventually determined patient will be on daily Plavix and statin.  She is allergic to aspirin .  Assessment & Plan:  Acute multifocal infarct Left-sided weakness - Patient was outside of TNK window.  CT head, CTA head and neck were unremarkable.  MRI was consistent with right-sided multifocal infarct.  LDL 23, A1c 7.4, UDS was negative. -Daily Plavix and statin.  Allergic to aspirin  - Echocardiogram is pending  Tele showed bradycardia and a very brief pause. No AV nodal blockers.   Acute hypoxia Acute COPD exacerbation - There was a question of possible BC powder allergic reaction due to facial swelling and hypoxia therefore received Solu-Medrol , epinephrine , Pepcid , albuterol .  CTA chest was negative for PE. - For now continue steroids, bronchodilators, I-S and flutter valve.  Continue to wean down oxygen .  Acute kidney injury -Creatinine peaked 1.2, now resolved at baseline 0.8  Leukocytosis - Suspect reactive and secondary to steroids.  Will check UA and trend.  No need for antibiotics at this time  Hypokalemia - As needed repletion  Diabetes mellitus type 2 - A1c 7.4.  Holding home p.o. regimen - Sliding scale and Accu-Cheks  Essential hypertension - Allowing permissive hypertension  Squamous cell carcinoma of the lung Active tobacco use - Follows  outpatient oncology team  Hyperlipidemia - Statin  Mood disorder - Amitriptyline  and appeared preserved  Chronic pain - Gabapentin   Tobacco use - Nicotine patch   DVT prophylaxis: Lovenox     Code Status: Full Code Family Communication:   Status is: Inpatient Remains inpatient appropriate because: Will need rehab   PT Follow up Recs:   Subjective: Still very weak on the right side Very brief 2 sec pause on Tele No complaints and remains asymptomatic.  During my visit her HR in 90s.    Examination:  General exam: Appears calm and comfortable  Respiratory system: b/l rhonchi Cardiovascular system: S1 & S2 heard, RRR. No JVD, murmurs, rubs, gallops or clicks. No pedal edema. Gastrointestinal system: Abdomen is nondistended, soft and nontender. No organomegaly or masses felt. Normal bowel sounds heard. Central nervous system: Alert and oriented. No focal neurological deficits. Extremities: Symmetric 5 x 5 power. Skin: No rashes, lesions or ulcers Psychiatry: Judgement and insight appear normal. Mood & affect appropriate.                Diet Orders (From admission, onward)     Start     Ordered   05/11/24 2203  Diet heart healthy/carb modified Room service appropriate? Yes; Fluid consistency: Thin  Diet effective now       Question Answer Comment  Diet-HS Snack? Nothing   Room service appropriate? Yes   Fluid consistency: Thin      05/11/24 2202            Objective: Vitals:   05/12/24 2344 05/13/24 0010 05/13/24 0325 05/13/24 0851  BP:  ROLLEN)  144/76 (!) 140/81 132/80  Pulse:  99 87   Resp:  20 20 19   Temp:  98.2 F (36.8 C) 98.6 F (37 C) 98.4 F (36.9 C)  TempSrc:  Oral Oral Axillary  SpO2: 99% 91% 98%     Intake/Output Summary (Last 24 hours) at 05/13/2024 1028 Last data filed at 05/13/2024 0442 Gross per 24 hour  Intake 360 ml  Output 2900 ml  Net -2540 ml   There were no vitals filed for this visit.  Scheduled Meds:   amitriptyline   100 mg Oral QHS   ARIPiprazole   20 mg Oral Daily   budesonide (PULMICORT) nebulizer solution  0.25 mg Nebulization BID   clopidogrel  75 mg Oral Daily   enoxaparin (LOVENOX) injection  40 mg Subcutaneous Q24H   famotidine   20 mg Oral BID   gabapentin   100 mg Oral BID   insulin  aspart  0-5 Units Subcutaneous QHS   insulin  aspart  0-9 Units Subcutaneous TID WC   ipratropium-albuterol   3 mL Nebulization Q8H   methylPREDNISolone  (SOLU-MEDROL ) injection  40 mg Intravenous Q12H   nicotine  14 mg Transdermal Daily   rosuvastatin   5 mg Oral Daily   Continuous Infusions:  Nutritional status     There is no height or weight on file to calculate BMI.  Data Reviewed:   CBC: Recent Labs  Lab 05/11/24 1300 05/11/24 1848 05/12/24 0556  WBC 11.7*  --  21.6*  NEUTROABS 7.1  --   --   HGB 14.6 12.9 11.8*  HCT 45.6 38.0 35.7*  MCV 97.6  --  96.2  PLT 273  --  238   Basic Metabolic Panel: Recent Labs  Lab 05/11/24 1437 05/11/24 1837 05/11/24 1848 05/12/24 0556  NA 140 137 138 136  K 3.6 3.2* 3.1* 4.9  CL 102 99 98 102  CO2 23 23  --  25  GLUCOSE 252* 256* 256* 205*  BUN 18 17 17 16   CREATININE 1.14* 1.15* 1.20* 0.87  CALCIUM  9.7 9.3  --  8.7*   GFR: Estimated Creatinine Clearance: 59 mL/min (by C-G formula based on SCr of 0.87 mg/dL). Liver Function Tests: Recent Labs  Lab 05/11/24 1837  AST 27  ALT 12  ALKPHOS 74  BILITOT 0.2  PROT 5.9*  ALBUMIN 3.6   No results for input(s): LIPASE, AMYLASE in the last 168 hours. No results for input(s): AMMONIA in the last 168 hours. Coagulation Profile: Recent Labs  Lab 05/11/24 1837  INR 1.1   Cardiac Enzymes: No results for input(s): CKTOTAL, CKMB, CKMBINDEX, TROPONINI in the last 168 hours. BNP (last 3 results) No results for input(s): PROBNP in the last 8760 hours. HbA1C: Recent Labs    05/11/24 1837  HGBA1C 7.4*   CBG: Recent Labs  Lab 05/12/24 0752 05/12/24 1148 05/12/24 1608  05/12/24 2130 05/13/24 0616  GLUCAP 190* 258* 230* 150* 302*   Lipid Profile: Recent Labs    05/12/24 0556  CHOL 101  HDL 45  LDLCALC 23  TRIG 166*  CHOLHDL 2.3   Thyroid  Function Tests: No results for input(s): TSH, T4TOTAL, FREET4, T3FREE, THYROIDAB in the last 72 hours. Anemia Panel: No results for input(s): VITAMINB12, FOLATE, FERRITIN, TIBC, IRON, RETICCTPCT in the last 72 hours. Sepsis Labs: No results for input(s): PROCALCITON, LATICACIDVEN in the last 168 hours.  No results found for this or any previous visit (from the past 240 hours).       Radiology Studies: VAS US  LOWER EXTREMITY VENOUS (DVT) Result  Date: 05/12/2024  Lower Venous DVT Study Patient Name:  [name]  Date of Exam:   05/12/2024 Medical Rec #: [id number]        Accession #:    [id number] Date of Birth: [birth year]         Patient Gender: F Patient Age:   19 years Exam Location:  [facility] Procedure:      VAS US  LOWER EXTREMITY VENOUS (DVT) Referring Phys: ARY XU --------------------------------------------------------------------------------  Indications: Stroke in cancer patient.  Risk Factors: Newly diagnosed squamous cell cancer of the lung. Comparison Study: Prior negative left LEV done 04/15/20 at [facility] Performing Technologist: Alberta Lis RVS  Examination Guidelines: A complete evaluation includes B-mode imaging, spectral Doppler, color Doppler, and power Doppler as needed of all accessible portions of each vessel. Bilateral testing is considered an integral part of a complete examination. Limited examinations for reoccurring indications may be performed as noted. The reflux portion of the exam is performed with the patient in reverse Trendelenburg.  +---------+---------------+---------+-----------+----------+--------------+ RIGHT    CompressibilityPhasicitySpontaneityPropertiesThrombus Aging  +---------+---------------+---------+-----------+----------+--------------+ CFV      Full           Yes      Yes                                 +---------+---------------+---------+-----------+----------+--------------+ SFJ      Full                                                        +---------+---------------+---------+-----------+----------+--------------+ FV Prox  Full           Yes      Yes                                 +---------+---------------+---------+-----------+----------+--------------+ FV Mid   Full           Yes      Yes                                 +---------+---------------+---------+-----------+----------+--------------+ FV DistalFull           Yes      Yes                                 +---------+---------------+---------+-----------+----------+--------------+ PFV      Full           Yes      Yes                                 +---------+---------------+---------+-----------+----------+--------------+ POP      Full                                                        +---------+---------------+---------+-----------+----------+--------------+ PTV      Full                                                        +---------+---------------+---------+-----------+----------+--------------+  PERO     Full                                                        +---------+---------------+---------+-----------+----------+--------------+   +---------+---------------+---------+-----------+----------+--------------+ LEFT     CompressibilityPhasicitySpontaneityPropertiesThrombus Aging +---------+---------------+---------+-----------+----------+--------------+ CFV      Full           Yes      Yes                                 +---------+---------------+---------+-----------+----------+--------------+ SFJ      Full                                                         +---------+---------------+---------+-----------+----------+--------------+ FV Prox  Full           Yes      Yes                                 +---------+---------------+---------+-----------+----------+--------------+ FV Mid   Full                                                        +---------+---------------+---------+-----------+----------+--------------+ FV DistalFull                                                        +---------+---------------+---------+-----------+----------+--------------+ PFV      Full           Yes      Yes                                 +---------+---------------+---------+-----------+----------+--------------+ POP      Full           Yes      Yes                                 +---------+---------------+---------+-----------+----------+--------------+ PTV      Full                                                        +---------+---------------+---------+-----------+----------+--------------+ PERO     Full                                                        +---------+---------------+---------+-----------+----------+--------------+  Summary: BILATERAL: - No evidence of deep vein thrombosis seen in the lower extremities, bilaterally. -No evidence of popliteal cyst, bilaterally.   *See table(s) above for measurements and observations. Electronically signed by Gaile New MD on 05/12/2024 at 10:12:34 PM.    Final    MR BRAIN WO CONTRAST Result Date: 05/12/2024 EXAM: MR Brain without Intravenous Contrast. CLINICAL HISTORY: 73 year old female with acute neuro deficit, stroke suspected, presenting yesterday. TECHNIQUE: Magnetic resonance images of the brain without intravenous contrast in multiple planes. CONTRAST: Without. COMPARISON: Head CT and CTA head and neck 05/11/2024, brain MRI 05/08/2024. FINDINGS: BRAIN: Widely scattered small and patchy areas of diffusion restriction in the right hemisphere correspond to the  right MCA territory, and the right MCA/PCA watershed area. Confluent superior perirolandic involvement although postcentral involvement predominates (series 5 image 44). Minimal involvement of the right caudate, basal ganglia otherwise spared. No contralateral left hemisphere or posterior fossa diffusion restriction. T2 and FLAIR hyperintense cytotoxic edema in the affected areas and there is scattered petechial hemorrhage in the right parietal and occipital lobe areas of involvement, new since 05/08/2024. No malignant hemorrhagic transformation. No intracranial mass effect. No midline shift or extra-axial fluid collection. Preexisting widespread chronic white matter T2 and FLAIR hyperintensity again noted. No cerebellar tonsillar ectopia. The central arterial and venous flow voids are patent. VENTRICLES: No hydrocephalus. ORBITS: The orbits are normal. SINUSES AND MASTOIDS: The sinuses and mastoid air cells are clear. BONES: No acute fracture or focal osseous lesion. IMPRESSION: 1. Widely scattered small Acute infarcts in the right MCA and right MCA/PCA watershed territories. 2. Cytotoxic edema and scattered acute petechial hemorrhage (right parietal and occipital). No malignant hemorrhagic transformation. No intracranial mass effect. 3. Underlying chronic white matter disease. Electronically signed by: Helayne Hurst MD 05/12/2024 10:05 AM EDT RP Workstation: [id number]   CT ANGIO HEAD NECK W WO CM W PERF (CODE STROKE) Result Date: 05/11/2024 EXAM: CTA Head and Neck with Perfusion 05/11/2024 06:08:14 PM TECHNIQUE: CTA of the head and neck was performed without and with the administration of 100 mL of intravenous contrast (iohexol  (OMNIPAQUE ) 350 MG/ML injection 100 mL IOHEXOL  350 MG/ML SOLN). 3D postprocessing with multiplanar reconstructions and MIPs was performed to evaluate the vascular anatomy. Cerebral perfusion analysis using computed tomography with contrast administration, including post-processing of  parametric maps with determination of cerebral blood flow, cerebral blood volume, mean transit time and time-to-maximum. Automated exposure control, iterative reconstruction, and/or weight based adjustment of the mA/kV was utilized to reduce the radiation dose to as low as reasonably achievable. COMPARISON: None available CLINICAL HISTORY: Neuro deficit, acute, stroke suspected. Left sided weakness. FINDINGS: CTA NECK: AORTIC ARCH AND ARCH VESSELS: Common origin of left common carotid artery and a dominant artery is noted. Atherosclerotic changes are present at the aorta and at the origin of the left subclavian artery without aneurysm or focal stenosis. No dissection or arterial injury. No significant stenosis of the brachiocephalic or subclavian arteries. CERVICAL CAROTID ARTERIES: Atherosclerotic changes are present at the right carotid bifurcation and proximal right ICA without focal stenosis. Atherosclerotic changes are present at the left carotid bifurcation and proximal left ICA without focal stenosis. No dissection, arterial injury, or hemodynamically significant stenosis by NASCET criteria. CERVICAL VERTEBRAL ARTERIES: The left vertebral artery is a dominant vessel. Moderate tortuosity is present in the V1 segment of the left vertebral artery without focal stenosis. No dissection, arterial injury, or significant stenosis. LUNGS AND MEDIASTINUM: Unremarkable. SOFT TISSUES: No acute abnormality. BONES: Grade 1 anterolisthesis and moderate  right facet degenerative change leads to moderate right foraminal stenosis at C3-C4. CTA HEAD: ANTERIOR CIRCULATION: No significant stenosis of the internal carotid arteries. No significant stenosis of the anterior cerebral arteries. No significant stenosis of the middle cerebral arteries. No aneurysm. POSTERIOR CIRCULATION: No significant stenosis of the posterior cerebral arteries. No significant stenosis of the basilar artery. No significant stenosis of the vertebral  arteries. No aneurysm. OTHER: No dural venous sinus thrombosis on this non-dedicated study. CT PERFUSION: EXAM QUALITY: Exam quality is adequate with diagnostic perfusion maps. No significant motion artifact. Appropriate arterial inflow and venous outflow curves. CORE INFARCT (CBF<30% volume): 0 mL TOTAL HYPOPERFUSION (Tmax>6s volume): 0 mL PENUMBRA: Mismatch volume: 0 mL IMPRESSION: 1. No acute large vessel occlusion. 2. No hemodynamically significant stenosis or aneurysm in the head or neck vessels. 3. No evidence of ischemia by CT brain perfusion. Electronically signed by: Lonni Necessary MD 05/11/2024 06:23 PM EDT RP Workstation: [id number]   CT HEAD CODE STROKE WO CONTRAST Result Date: 05/11/2024 EXAM: CT HEAD WITHOUT CONTRAST 05/11/2024 06:08:14 PM TECHNIQUE: CT of the head was performed without the administration of intravenous contrast. Automated exposure control, iterative reconstruction, and/or weight based adjustment of the mA/kV was utilized to reduce the radiation dose to as low as reasonably achievable. COMPARISON: None available. CLINICAL HISTORY: Neuro deficit, acute, stroke suspected. Left sided weakness. FINDINGS: BRAIN AND VENTRICLES: Intravascular contrast from CT angio chest of the same day reduces sensitivity for subarachnoid blood. No focal hemorrhage is present. Periventricular and scattered subcortical white matter hypoattenuation is moderately advanced for age. This most likely reflects the sequelae of chronic microvascular ischemia. No evidence of acute infarct. No hydrocephalus. No extra-axial collection. No mass effect or midline shift. ORBITS: No acute abnormality. SINUSES: No acute abnormality. SOFT TISSUES AND SKULL: No acute soft tissue abnormality. No skull fracture. IMPRESSION: 1. No acute intracranial abnormality. Electronically signed by: Lonni Necessary MD 05/11/2024 06:13 PM EDT RP Workstation: [id number]   CT Angio Chest PE W and/or Wo Contrast Result Date:  05/11/2024 CLINICAL DATA:  Pulmonary embolism (PE) suspected, low to intermediate prob, positive D-dimer. Difficulty breathing. Possible allergic reaction. EXAM: CT ANGIOGRAPHY CHEST WITH CONTRAST TECHNIQUE: Multidetector CT imaging of the chest was performed using the standard protocol during bolus administration of intravenous contrast. Multiplanar CT image reconstructions and MIPs were obtained to evaluate the vascular anatomy. RADIATION DOSE REDUCTION: This exam was performed according to the departmental dose-optimization program which includes automated exposure control, adjustment of the mA and/or kV according to patient size and/or use of iterative reconstruction technique. CONTRAST:  75mL OMNIPAQUE  IOHEXOL  350 MG/ML SOLN COMPARISON:  None Available. FINDINGS: Cardiovascular: Scattered coronary artery and aortic atherosclerosis. Heart is normal size. Aorta is normal caliber. No filling defects in the pulmonary arteries to suggest pulmonary emboli. Mediastinum/Nodes: No mediastinal, hilar, or axillary adenopathy. Trachea and esophagus are unremarkable. 1.6 cm left thyroid  nodule, unchanged since prior study. This has been evaluated on previous imaging. (ref: J Am Coll Radiol. 2015 Feb;12(2): 143-50). Lungs/Pleura: Lingular nodule again noted measuring 1.4 cm in greatest diameter, unchanged. Biopsy fiducial adjacent to the nodule, unchanged since prior PET CT. No new nodules. Bilateral lower lobe airway thickening with areas of mucous plugging and bibasilar atelectasis. No effusions. Upper Abdomen: No acute findings Musculoskeletal: Chest wall soft tissues are unremarkable. No acute bony abnormality. Review of the MIP images confirms the above findings. IMPRESSION: No evidence of pulmonary embolus. Stable lingular nodule, 1.4 cm. Airway thickening with bibasilar atelectasis. Coronary artery disease. Aortic Atherosclerosis (ICD10-I70.0). Electronically  Signed   By: Franky Crease M.D.   On: 05/11/2024 17:31    DG Chest Port 1 View Result Date: 05/11/2024 EXAM: 1 VIEW(S) XRAY OF THE CHEST 05/11/2024 01:50:00 PM COMPARISON: 04/23/2024 CLINICAL HISTORY: Allergic reaction [id number]. Per chart: Pt BIB EMS from Home due to sudden difficulty breathing after take her medication BC for migraines possible allergic reaction; initially SpO2 65% on room air. Hx of Lung Cancer and COPD, no O2 at home. Pt has an appointment with Cancer center ; at 2:30. FINDINGS: LUNGS AND PLEURA: Vague nodule with fiducial marker in left mid lung. No pulmonary edema. No pleural effusion. No pneumothorax. HEART AND MEDIASTINUM: No acute abnormality of the cardiac and mediastinal silhouettes. BONES AND SOFT TISSUES: No acute osseous abnormality. IMPRESSION: 1. No acute cardiopulmonary process. 2. Left mid-lung pulmonary   fiducial marker. Electronically signed by: Dayne Hassell MD 05/11/2024 02:10 PM EDT RP Workstation: [id number]           LOS: 1 day   Time spent= 35 mins    Burgess JAYSON Dare, MD [facility]  If 7PM-7AM, please contact night-coverage  05/13/2024, 10:28 AM

## 2024-05-13 NOTE — Evaluation (Addendum)
 Occupational Therapy Evaluation Patient Details Name: [name] MRN: [id number] DOB: April 22, 1951 Today's Date: 05/13/2024   History of Present Illness   73 year old presented 10/10 with shortness of breath possible allergic reaction to BC powder. Noted to have hypoxia and facial edema. Also noted to have left-sided neglect with weakness. Code stroke was activated and eventually workup was consistent with multifocal infarct, likely embolic source. PMH: SCC of the lung, COPD with ongoing tobacco use, HLD, depression, DM2, HTN     Clinical Impressions Pt resting in bed, eager to participate. Pt lives at home with significant other, 1 STE, two level house with bed upstairs, walk in shower downstairs. PLOF independent. Pt currently with significant balance deficits, L lateral lean sitting/standing, not able to self recover without cueing or physical assist mod A. Pt not able to don/doff socks due to L lateral lean, max A for LB dressing. Pt has good overall strength and ROM, good power for STS but min/mod A with RW due to L lateral lean when taking steps. Pt would benefit from postacute intensive rehab >3hrs/day to maximize safety with mobility and ADLs, will continue to see acutely to progress as able.      If plan is discharge home, recommend the following:   A lot of help with walking and/or transfers;A little help with bathing/dressing/bathroom;Assistance with cooking/housework;Assist for transportation;Help with stairs or ramp for entrance     Functional Status Assessment   Patient has had a recent decline in their functional status and demonstrates the ability to make significant improvements in function in a reasonable and predictable amount of time.     Equipment Recommendations   Other (comment) (defer)     Recommendations for Other Services   Rehab consult     Precautions/Restrictions   Precautions Precautions: Fall Recall of Precautions/Restrictions:  Intact Restrictions Weight Bearing Restrictions Per Provider Order: No     Mobility Bed Mobility Overal bed mobility: Needs Assistance Bed Mobility: Supine to Sit, Sit to Supine     Supine to sit: Min assist Sit to supine: Min assist, Used rails   General bed mobility comments: min A in/out of bed, Pt with L side neglect and was not able to move [name] off EOB without verbal cueing, L lateral lean so physical assist to ensure did not fall off EOB    Transfers Overall transfer level: Needs assistance Equipment used: Rolling walker (2 wheels) Transfers: Sit to/from Stand, Bed to chair/wheelchair/BSC Sit to Stand: Min assist, Mod assist     Step pivot transfers: Mod assist     General transfer comment: Good strength for boost but min/mod A due to L lateral lean at times, not able to recover without physical assist.      Balance Overall balance assessment: Needs assistance Sitting-balance support: No upper extremity supported, Feet supported Sitting balance-Leahy Scale: Poor Sitting balance - Comments: L lateral lean at times requiring physical assist Postural control: Left lateral lean Standing balance support: Bilateral upper extremity supported, During functional activity, Reliant on assistive device for balance Standing balance-Leahy Scale: Poor Standing balance comment: L lateral lean at times, not able to self correct without cueing and physical assist                           ADL either performed or assessed with clinical judgement   ADL Overall ADL's : Needs assistance/impaired Eating/Feeding: Set up;Sitting   Grooming: Set up;Sitting   Upper Body Bathing: Minimal assistance;Moderate assistance;Sitting  Lower Body Bathing: Maximal assistance;Sitting/lateral leans   Upper Body Dressing : Minimal assistance;Sitting   Lower Body Dressing: Maximal assistance;Sitting/lateral leans;Sit to/from stand   Toilet Transfer: Moderate assistance;Rolling walker  (2 wheels);BSC/3in1   Toileting- Clothing Manipulation and Hygiene: Minimal assistance;Moderate assistance;Sitting/lateral lean       Functional mobility during ADLs: Moderate assistance;Rolling walker (2 wheels) General ADL Comments: Pt with L side neglect, L lateral lean with sitting/standing limiting safety with ADLs and transfers. overall good strength and ROM but needs help with physical support and cueing for balance     Vision Baseline Vision/History: 1 Wears glasses Ability to See in Adequate Light: 0 Adequate Patient Visual Report: No change from baseline       Perception         Praxis         Pertinent Vitals/Pain Pain Assessment Pain Assessment: No/denies pain     Extremity/Trunk Assessment Upper Extremity Assessment Upper Extremity Assessment: [name] deficits/detail [name] Deficits / Details: good strength, fair proprioception when tested but during functional activities Pt with L side neglect requiring verbal cueing for [name] placement and safety. [name] Sensation: decreased proprioception [name] Coordination: WNL   Lower Extremity Assessment Lower Extremity Assessment: [name] deficits/detail [name] Deficits / Details: Lt hallux 3/5, ankle DF 4/5, ankle eversion 4/5 [name] Sensation:  (Sensation intact to light touch) [name] Coordination: decreased fine motor;decreased gross motor       Communication Communication Communication: No apparent difficulties   Cognition Arousal: Alert Behavior During Therapy: WFL for tasks assessed/performed Cognition: No apparent impairments                               Following commands: Intact       Cueing  General Comments   Cueing Techniques: Verbal cues  O2 saturation down to 89-92% on RA, improves with 2L O2 supplementation   Exercises     Shoulder Instructions      Home Living Family/patient expects to be discharged to:: Private residence Living Arrangements: Spouse/significant other Available Help at  Discharge: Other (Comment) Type of Home: House Home Access: Stairs to enter Entrance Stairs-Number of Steps: 1 Entrance Stairs-Rails: None Home Layout: Two level Alternate Level Stairs-Number of Steps: flight Alternate Level Stairs-Rails: Left;Right Bathroom Shower/Tub: Producer, television/film/video: Handicapped height Bathroom Accessibility: Yes   Home Equipment: Grab bars - toilet;Grab bars - tub/shower;Shower seat - built in;Cane - single point;Cane - Programmer, applications (2 wheels)   Additional Comments: lives with significant other, walk in shower downstairs on main level, has bathtub upstairs  Lives With: Significant other    Prior Functioning/Environment Prior Level of Function : Independent/Modified Independent             Mobility Comments: not using AD, recent fall 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    OT Problem List: Decreased range of motion;Decreased activity tolerance;Impaired balance (sitting and/or standing);Decreased coordination;Decreased safety awareness   OT Treatment/Interventions: Self-care/ADL training;Therapeutic exercise;Neuromuscular education;Energy conservation;DME and/or AE instruction;Therapeutic activities;Patient/family education;Balance training      OT Goals(Current goals can be found in the care plan section)   Acute Rehab OT Goals Patient Stated Goal: to go to rehab OT Goal Formulation: With patient Time For Goal Achievement: 05/27/24 Potential to Achieve Goals: Good ADL Goals Pt Will Perform Upper Body Dressing: with supervision Pt Will Perform Lower Body Dressing: with supervision Pt Will Transfer to Toilet: with supervision Pt Will Perform Toileting -  Clothing Manipulation and hygiene: with supervision   OT Frequency:  Min 2X/week    Co-evaluation              AM-PAC OT 6 Clicks Daily Activity     Outcome Measure Help from another person eating meals?: A Little Help from another  person taking care of personal grooming?: A Little Help from another person toileting, which includes using toliet, bedpan, or urinal?: A Lot Help from another person bathing (including washing, rinsing, drying)?: A Lot Help from another person to put on and taking off regular upper body clothing?: A Little Help from another person to put on and taking off regular lower body clothing?: A Lot 6 Click Score: 15   End of Session Equipment Utilized During Treatment: Gait belt;Rolling walker (2 wheels) Nurse Communication: Mobility status  Activity Tolerance: Patient tolerated treatment well Patient left: in bed;with call bell/phone within reach;with bed alarm set  OT Visit Diagnosis: Unsteadiness on feet (R26.81);Other abnormalities of gait and mobility (R26.89);Muscle weakness (generalized) (M62.81);Hemiplegia and hemiparesis Hemiplegia - Right/Left: Left                Time: 8846-8771 OT Time Calculation (min): 35 min Charges:  OT General Charges $OT Visit: 1 Visit OT Evaluation $OT Eval Moderate Complexity: 1 Mod OT Treatments $Self Care/Home Management : 8-22 mins  [place], OTR/L   [name] 05/13/2024, 12:45 PM

## 2024-05-13 NOTE — Hospital Course (Addendum)
 Brief Narrative:   73 year old with history of SCC of the lung, COPD with ongoing tobacco use, HLD, depression, DM2, HTN presented with shortness of breath possible allergic reaction to BC powder.  Noted to have hypoxia and facial edema.  Patient was given Solu-Medrol , epinephrine  and Zofran  with EMS.  Also noted to have left-sided neglect with weakness.  Code stroke was activated and eventually workup was consistent with multifocal infarct, likely embolic source.  Routine workup was performed by neurology team and eventually determined patient will be on daily Plavix and statin.  She is allergic to aspirin .  Assessment & Plan:  Acute multifocal infarct Left-sided weakness - Patient was outside of TNK window.  CT head, CTA head and neck were unremarkable.  MRI was consistent with right-sided multifocal infarct.  LDL 23, A1c 7.4, UDS was negative. -Daily Plavix and statin.  Allergic to aspirin  - Echocardiogram preserved EF of 65% with mild LVH.  Tele showed bradycardia and a very brief pause. No AV nodal blockers.  Briefly discussed with EP, no further workup indicated at this time.  Continue with plan for outpatient loop recorder  Acute hypoxia Acute COPD exacerbation - There was a question of possible BC powder allergic reaction due to facial swelling and hypoxia therefore received Solu-Medrol , epinephrine , Pepcid , albuterol .  CTA chest was negative for PE.  Continue bronchodilators, I-S/flutter valve.  Continue to wean off oxygen .  Transition to p.o. steroids upon discharge   Acute kidney injury -Creatinine peaked 1.2, now resolved at baseline 0.8  Leukocytosis - Suspect reactive and secondary to steroids.  Will check UA and trend.  No need for antibiotics at this time  Hypokalemia - As needed repletion  Diabetes mellitus type 2, hyperglycemia steroid-induced - A1c 7.4.  Holding home p.o. regimen - Sliding scale and Accu-Cheks, add long-acting.  Adjust as necessary  Essential  hypertension - Allowing permissive hypertension  Squamous cell carcinoma of the lung Active tobacco use - Follows outpatient oncology team  Hyperlipidemia - Statin  Mood disorder - Amitriptyline  and appeared preserved  Chronic pain - Gabapentin   Tobacco use - Nicotine patch   DVT prophylaxis: Lovenox     Code Status: Full Code Family Communication:   Status is: Inpatient Remains inpatient appropriate because: Will need rehab   PT Follow up Recs: Acute Inpatient Rehab (3hours/Day)05/13/2024 0900  Subjective:  Seen at bedside no complaints.  Examination:  General exam: Appears calm and comfortable  Respiratory system: b/l rhonchi Cardiovascular system: S1 & S2 heard, RRR. No JVD, murmurs, rubs, gallops or clicks. No pedal edema. Gastrointestinal system: Abdomen is nondistended, soft and nontender. No organomegaly or masses felt. Normal bowel sounds heard. Central nervous system: Alert and oriented. No focal neurological deficits. Extremities: Symmetric 5 x 5 power. Skin: No rashes, lesions or ulcers Psychiatry: Judgement and insight appear normal. Mood & affect appropriate.

## 2024-05-13 NOTE — Evaluation (Signed)
 Physical Therapy Evaluation Patient Details Name: [name] MRN: [id number] DOB: [birth year] Today's Date: 05/13/2024  History of Present Illness  73 year old presented 10/10 with shortness of breath possible allergic reaction to BC powder. Noted to have hypoxia and facial edema. Also noted to have left-sided neglect with weakness. Code stroke was activated and eventually workup was consistent with multifocal infarct, likely embolic source. PMH: SCC of the lung, COPD with ongoing tobacco use, HLD, depression, DM2, HTN  Clinical Impression  Pt admitted with above diagnosis. Previously independent, owns a pet sitting business. Lives with a partner who is has MS. Patient required min assist to stand and up to mod assist for gait demonstrating notable ataxia and hemiparetic gait. She is having some symptoms of vertigo upon standing. Educated on safety, POC, recommendations. Patient will benefit from intensive inpatient follow-up therapy, >3 hours/day. Pt currently with functional limitations due to the deficits listed below (see PT Problem List). Pt will benefit from acute skilled PT to increase their independence and safety with mobility to allow discharge.           If plan is discharge home, recommend the following: A lot of help with walking and/or transfers;A little help with bathing/dressing/bathroom;Assistance with cooking/housework;Assist for transportation;Help with stairs or ramp for entrance   Can travel by private vehicle        Equipment Recommendations None recommended by PT  Recommendations for Other Services  Rehab consult    Functional Status Assessment Patient has had a recent decline in their functional status and demonstrates the ability to make significant improvements in function in a reasonable and predictable amount of time.     Precautions / Restrictions Precautions Precautions: Fall Recall of Precautions/Restrictions: Intact Restrictions Weight Bearing  Restrictions Per Provider Order: No      Mobility  Bed Mobility Overal bed mobility: Needs Assistance Bed Mobility: Supine to Sit, Sit to Supine     Supine to sit: Contact guard, HOB elevated, Used rails Sit to supine: Min assist, Used rails   General bed mobility comments: CGA to rise with heavy use of rail and extra time. Cues for technique. Min assist for LEs into bed.    Transfers Overall transfer level: Needs assistance Equipment used: Rolling walker (2 wheels) Transfers: Sit to/from Stand Sit to Stand: Min assist           General transfer comment: Min assist for boost and balance, leaning towards left. + Dizziness (vertigo.)    Ambulation/Gait Ambulation/Gait assistance: Mod assist Gait Distance (Feet): 20 Feet Assistive device: Rolling walker (2 wheels) Gait Pattern/deviations: Step-to pattern, Decreased stride length, Decreased weight shift to right, Shuffle, Ataxic, Scissoring, Staggering left, Drifts right/left, Trunk flexed, Narrow base of support Gait velocity: dec Gait velocity interpretation: <1.31 ft/sec, indicative of household ambulator Pre-gait activities: Static march General Gait Details: Heavy leftward lean at times, requiring up to mod assist to correct. Cues for awareness of [name] placement, to widen BOS, and correct position within RW to maximize support. [name] grip strength adequate to hold RW but needs assist at all times for balance and RW control.  Ataxic without overt buckling. Needs cued frequently to lead with [name] due to neglect of [name] placement if stepping forward with RLE first.  Stairs            Wheelchair Mobility     Tilt Bed    Modified Rankin (Stroke Patients Only) Modified Rankin (Stroke Patients Only) Pre-Morbid Rankin Score: No symptoms Modified Rankin: Moderately severe disability  Balance Overall balance assessment: Needs assistance Sitting-balance support: No upper extremity supported, Feet supported Sitting  balance-Leahy Scale: Fair     Standing balance support: Bilateral upper extremity supported Standing balance-Leahy Scale: Poor Standing balance comment: Leans left spontaneously after correcting.                             Pertinent Vitals/Pain Pain Assessment Pain Assessment: No/denies pain    Home Living Family/patient expects to be discharged to:: Private residence Living Arrangements: Spouse/significant other Available Help at Discharge: Other (Comment) (she cares for her partner. 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     Extremity/Trunk Assessment   Upper Extremity Assessment Upper Extremity Assessment: Defer to OT evaluation    Lower Extremity Assessment Lower Extremity Assessment: [name] deficits/detail [name] Deficits / Details: Lt hallux 3/5, ankle DF 4/5, ankle eversion 4/5 [name] Sensation:  (Sensation intact to light touch) [name] Coordination: decreased fine motor;decreased gross motor       Communication   Communication Communication: No apparent difficulties    Cognition Arousal: Alert Behavior During Therapy: WFL for tasks assessed/performed   PT - Cognitive impairments: No apparent impairments                         Following commands: Intact       Cueing Cueing Techniques: Verbal cues     General Comments General comments (skin  integrity, edema, etc.): SpO2 96% on 2L, 90% on RA. HR 80s-100s at rest and with exertion respectively.    Exercises General Exercises - Lower Extremity Ankle Circles/Pumps: AROM, Both, 10 reps, Seated   Assessment/Plan    PT Assessment Patient needs continued PT services  PT Problem List Decreased strength;Decreased balance;Decreased activity tolerance;Decreased mobility;Decreased coordination;Decreased knowledge of use of DME;Cardiopulmonary status limiting activity;Impaired tone       PT Treatment Interventions DME instruction;Gait training;Functional mobility training;Stair training;Therapeutic activities;Therapeutic exercise;Balance training;Neuromuscular re-education;Patient/family education    PT Goals (Current goals can be found in the Care Plan section)  Acute Rehab PT Goals Patient Stated Goal: Get stronger return home PT Goal Formulation: With patient Time For Goal Achievement: 05/27/24 Potential to Achieve Goals: Good    Frequency Min 3X/week     Co-evaluation               AM-PAC PT 6 Clicks Mobility  Outcome Measure Help needed turning from your back to your side while in a flat bed without using bedrails?: A Little Help needed moving from lying on your back to sitting on the side of a flat bed without using bedrails?: A Little Help needed moving to and from a bed to a chair (including a wheelchair)?: A Little Help needed standing up from a chair using your arms (e.g., wheelchair or bedside chair)?: A Little Help needed to walk in hospital room?: A Lot Help needed climbing 3-5  steps with a railing? : Total 6 Click Score: 15    End of Session Equipment Utilized During Treatment: Gait belt;Oxygen  Activity Tolerance: Patient tolerated treatment well Patient left: with call bell/phone within reach;in bed;with bed alarm set (echo in room for test) Nurse Communication: Mobility status PT Visit Diagnosis: Unsteadiness on feet (R26.81);Other abnormalities of  gait and mobility (R26.89);History of falling (Z91.81);Ataxic gait (R26.0);Hemiplegia and hemiparesis Hemiplegia - Right/Left: Left Hemiplegia - dominant/non-dominant: Non-dominant    Time: 9047-8985 PT Time Calculation (min) (ACUTE ONLY): 22 min   Charges:   PT Evaluation $PT Eval Moderate Complexity: 1 Mod   PT General Charges $$ ACUTE PT VISIT: 1 Visit         Leontine Roads, PT, DPT [facility]  Rehabilitation Services Physical Therapist Office: [phone] Website: [email or web address]   Leontine GORMAN Roads 05/13/2024, 10:52 AM

## 2024-05-14 DIAGNOSIS — I639 Cerebral infarction, unspecified: Secondary | ICD-10-CM | POA: Diagnosis not present

## 2024-05-14 DIAGNOSIS — I6340 Cerebral infarction due to embolism of unspecified cerebral artery: Secondary | ICD-10-CM | POA: Diagnosis not present

## 2024-05-14 DIAGNOSIS — R531 Weakness: Secondary | ICD-10-CM | POA: Diagnosis not present

## 2024-05-14 DIAGNOSIS — C3412 Malignant neoplasm of upper lobe, left bronchus or lung: Secondary | ICD-10-CM | POA: Diagnosis not present

## 2024-05-14 DIAGNOSIS — F17210 Nicotine dependence, cigarettes, uncomplicated: Secondary | ICD-10-CM | POA: Diagnosis not present

## 2024-05-14 DIAGNOSIS — J9601 Acute respiratory failure with hypoxia: Secondary | ICD-10-CM | POA: Diagnosis not present

## 2024-05-14 LAB — BASIC METABOLIC PANEL WITH GFR
Anion gap: 11 (ref 5–15)
BUN: 20 mg/dL (ref 8–23)
CO2: 27 mmol/L (ref 22–32)
Calcium: 9.2 mg/dL (ref 8.9–10.3)
Chloride: 98 mmol/L (ref 98–111)
Creatinine, Ser: 0.74 mg/dL (ref 0.44–1.00)
GFR, Estimated: >60 mL/min (ref >60)
Glucose, Bld: 230 mg/dL — ABNORMAL HIGH (ref 70–99)
Potassium: 4.7 mmol/L (ref 3.5–5.1)
Sodium: 136 mmol/L (ref 135–145)

## 2024-05-14 LAB — PHOSPHORUS: Phosphorus: 3.9 mg/dL (ref 2.5–4.6)

## 2024-05-14 LAB — CBC
HCT: 33.8 % — ABNORMAL LOW (ref 36.0–46.0)
Hemoglobin: 11.4 g/dL — ABNORMAL LOW (ref 12.0–15.0)
MCH: 32 pg (ref 26.0–34.0)
MCHC: 33.7 g/dL (ref 30.0–36.0)
MCV: 94.9 fL (ref 80.0–100.0)
Platelets: 242 K/uL (ref 150–400)
RBC: 3.56 MIL/uL — ABNORMAL LOW (ref 3.87–5.11)
RDW: 12.4 % (ref 11.5–15.5)
WBC: 15.4 K/uL — ABNORMAL HIGH (ref 4.0–10.5)
nRBC: 0 % (ref 0.0–0.2)

## 2024-05-14 LAB — GLUCOSE, CAPILLARY
Glucose-Capillary: 214 mg/dL — ABNORMAL HIGH (ref 70–99)
Glucose-Capillary: 192 mg/dL — ABNORMAL HIGH (ref 70–99)
Glucose-Capillary: 290 mg/dL — ABNORMAL HIGH (ref 70–99)
Glucose-Capillary: 251 mg/dL — ABNORMAL HIGH (ref 70–99)

## 2024-05-14 LAB — MAGNESIUM: Magnesium: 1.9 mg/dL (ref 1.7–2.4)

## 2024-05-14 MED ORDER — IPRATROPIUM-ALBUTEROL 0.5-2.5 (3) MG/3ML IN SOLN
3.0000 mL | Freq: Three times a day (TID) | RESPIRATORY_TRACT | Status: DC
  Administered 2024-05-14: 3 mL via RESPIRATORY_TRACT
  Filled 2024-05-14: qty 3

## 2024-05-14 MED ORDER — INSULIN GLARGINE 100 UNIT/ML ~~LOC~~ SOLN
10.0000 [IU] | Freq: Every day | SUBCUTANEOUS | Status: DC
  Administered 2024-05-14: 10 [IU] via SUBCUTANEOUS
  Filled 2024-05-14 (×2): qty 0.1

## 2024-05-14 MED ORDER — IPRATROPIUM-ALBUTEROL 0.5-2.5 (3) MG/3ML IN SOLN
3.0000 mL | Freq: Two times a day (BID) | RESPIRATORY_TRACT | Status: DC
Start: 2024-05-15 — End: 2024-05-17
  Administered 2024-05-15 – 2024-05-17 (×5): 3 mL via RESPIRATORY_TRACT
  Filled 2024-05-14 (×6): qty 3

## 2024-05-14 NOTE — Inpatient Diabetes Management (Signed)
 Inpatient Diabetes Program Recommendations  AACE/ADA: New Consensus Statement on Inpatient Glycemic Control (2015)  Target Ranges:  Prepandial:   less than 140 mg/dL      Peak postprandial:   less than 180 mg/dL (1-2 hours)      Critically ill patients:  140 - 180 mg/dL   Lab Results  Component Value Date   GLUCAP 214 (H) 05/14/2024   HGBA1C 7.4 (H) 05/11/2024    Review of Glycemic Control  Latest Reference Range & Units 05/13/24 06:16 05/13/24 12:04 05/13/24 16:31 05/13/24 21:08 05/14/24 06:41  Glucose-Capillary 70 - 99 mg/dL 697 (H) 777 (H) 699 (H) 269 (H) 214 (H)  (H): Data is abnormally high  Diabetes history: DM2 Outpatient Diabetes medications: Metformin  XR 1000 mg BID Current orders for Inpatient glycemic control: Novolog  0-9 units TID and 0-5 units QHS  Inpatient Diabetes Program Recommendations:    While receiving steroids. Please consider:  Lantus 10 units every day Novolog  2 units TID with meals if she consumes at least 50%  Thank you, Wyvonna Pinal, MSN, CDCES Diabetes Coordinator Inpatient Diabetes Program [phone] (team pager from 8a-5p)

## 2024-05-14 NOTE — Progress Notes (Signed)
 Inpatient Rehab Coordinator Note:  I met with patient at bedside to discuss CIR recommendations and goals/expectations of CIR stay.  We reviewed 3 hrs/day of therapy, physician follow up, and average length of stay 2 weeks (dependent upon progress) with goals of supervision to mod I.  Pt confirms that she lives with her partner who has MS, but is fully independent (just doesn't drive).  Also has a neighbor who is supportive.  She is amenable to rehab.  We reviewed insurance process and I will start this today.   Reche Lowers, PT, DPT Admissions Coordinator [phone] 05/14/24  3:44 PM

## 2024-05-14 NOTE — Consult Note (Signed)
 Physical Medicine and Rehabilitation Consult Reason for Consult: Left-sided weakness after stroke Referring Physician: Jerri   HPI: Myleah D Castille is a 73 y.o. female with a history of lung cancer, hypertension, diabetes, migraine headaches who was admitted on 05/12/2024 with headaches dizziness and hypoxia.  She is also was found to have left-sided weakness.  No TNK given due to the fact that patient was outside the therapeutic window.  CT and CTA were unremarkable.  MRI revealed right MCA, ACA, and MCA/PCA infarcts felt to be embolic.  Loop recorder was recommended.  Patient was on no antithrombotics prior to admission, now she is on Plavix 75 mg daily.  Patient was up with therapies yesterday and transferred sit to stand with min assist.  She ambulated 20 feet mod assist using rolling walker.  Therapy notes significant ataxia and scissoring involving the left lower extremity.  For basic ADLs she was mod to max assistance.  Prior to the arrival patient was independent and working.  She lives with spouse in a two-level house with one-step to enter.  It appears that she could live on the first floor if needed.    Home: Home Living Family/patient expects to be discharged to:: Private residence Living Arrangements: Spouse/significant other Available Help at Discharge: Other (Comment) Type of Home: House Home Access: Stairs to enter [employer] of Steps: 1 Entrance Stairs-Rails: None Home Layout: Two level Alternate Level Stairs-Number of Steps: flight Alternate Level Stairs-Rails: Left, Right Bathroom Shower/Tub: Health visitor: Handicapped height Bathroom Accessibility: Yes Home Equipment: Grab bars - toilet, Grab bars - tub/shower, Shower seat - built in, [place] - single point, [employer] - quad, Agricultural consultant (2 wheels) Additional Comments: lives with significant other, walk in shower downstairs on main level, has bathtub upstairs  Lives With: Significant other   Functional History: Prior Function Prior Level of Function : Independent/Modified Independent Mobility Comments: not using AD, recent fall 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 Functional Status:  Mobility: Bed Mobility Overal bed mobility: Needs Assistance Bed Mobility: Supine to Sit, Sit to Supine Supine to sit: Min assist Sit to supine: Min assist, Used rails General bed mobility comments: min A in/out of bed, Pt with L side neglect and was not able to move [name] off EOB without verbal cueing, L lateral lean so physical assist to ensure did not fall off EOB Transfers Overall transfer level: Needs assistance Equipment used: Rolling walker (2 wheels) Transfers: Sit to/from Stand, Bed to chair/wheelchair/BSC Sit to Stand: Min assist, Mod assist Bed to/from chair/wheelchair/BSC transfer type:: Step pivot Step pivot transfers: Mod assist General transfer comment: Good strength for boost but min/mod A due to L lateral lean at times, not able to recover without physical assist. Ambulation/Gait Ambulation/Gait assistance: Mod assist Gait Distance (Feet): 20 Feet Assistive device: Rolling walker (2 wheels) Gait Pattern/deviations: Step-to pattern, Decreased stride length, Decreased weight shift to right, Shuffle, Ataxic, Scissoring, Staggering left, Drifts right/left, Trunk flexed, Narrow base of support General Gait Details: Heavy leftward lean at times, requiring up to mod assist to correct. Cues for awareness of [name] placement, to widen BOS, and correct position within RW to maximize support. [name] grip strength adequate to hold RW but needs assist at all times for balance and RW control.  Ataxic without overt buckling. Needs cued frequently to lead with [name] due to neglect of [name] placement if stepping forward with RLE first. Gait velocity: dec Gait velocity interpretation: <1.31 ft/sec, indicative of  household ambulator Pre-gait activities: Engineer, water march     ADL: ADL Overall ADL's : Needs assistance/impaired Eating/Feeding: Set up, Sitting Grooming: Set up, Sitting Upper Body Bathing: Minimal assistance, Moderate assistance, Sitting Lower Body Bathing: Maximal assistance, Sitting/lateral leans Upper Body Dressing : Minimal assistance, Sitting Lower Body Dressing: Maximal assistance, Sitting/lateral leans, Sit to/from stand Toilet Transfer: Moderate assistance, Rolling walker (2 wheels), BSC/3in1 Toileting- Clothing Manipulation and Hygiene: Minimal assistance, Moderate assistance, Sitting/lateral lean Functional mobility during ADLs: Moderate assistance, Rolling walker (2 wheels) General ADL Comments: Pt with L side neglect, L lateral lean with sitting/standing limiting safety with ADLs and transfers. overall good strength and ROM but needs help with physical support and cueing for balance  Cognition: Cognition Overall Cognitive Status: Within Functional Limits for tasks assessed Arousal/Alertness: Awake/alert Orientation Level: Oriented X4 Attention: Sustained Sustained Attention: Appears intact Memory: Impaired Memory Impairment: Decreased recall of new information (66% accurate) Awareness: Appears intact Problem Solving: Appears intact Cognition Arousal: Alert Behavior During Therapy: WFL for tasks assessed/performed Overall Cognitive Status: Within Functional Limits for tasks assessed   Review of Systems  Constitutional: Negative.   HENT: Negative.    Eyes: Negative.   Respiratory:  Positive for shortness of breath.   Cardiovascular: Negative.   Gastrointestinal: Negative.   Genitourinary: Negative.   Musculoskeletal: Negative.   Skin: Negative.   Neurological:  Positive for dizziness, sensory change, focal weakness and weakness.  Psychiatric/Behavioral: Negative.     Past Medical History:  Diagnosis Date   Alcohol abuse, in remission    Anxiety    Cancer of sigmoid colon (HCC) 07/2005   T2N0M0   Chronic  bronchitis    COPD (chronic obstructive pulmonary disease) (HCC)    Depression    Diabetes mellitus without complication (HCC)    GERD (gastroesophageal reflux disease)    History of thyroid  nodule    monitoring   Hyperlipidemia    Hypertension    Migraines    OA (osteoarthritis)    PONV (postoperative nausea and vomiting)    Reflux    Past Surgical History:  Procedure Laterality Date   ABDOMINAL EXPOSURE N/A 11/24/2016   Procedure: ABDOMINAL EXPOSURE;  Surgeon: Krystal JULIANNA Doing, MD;  [facility] OR;  Service: Vascular;  Laterality: N/A;   ABDOMINAL HYSTERECTOMY  1992   age 24 and appendics   ANTERIOR LUMBAR FUSION Bilateral 11/24/2016   Procedure: LUMBAR 5-SACRUM 1 ANTERIOR LUMBAR INTERBODY FUSION;  Surgeon: Oneil Priestly, MD;  Location: [facility] OR;  Service: Orthopedics;  Laterality: Bilateral;  LUMBAR 5-SACRUM 1 ANTERIOR LUMBAR INTERBODY FUSION    ARTHRODESIS METATARSALPHALANGEAL JOINT (MTPJ) Left 08/15/2023   Procedure: ARTHRODESIS METATARSALPHALANGEAL JOINT (MTPJ);  Surgeon: Tobie Franky SQUIBB, DPM;  Location: [facility] ORS;  Service: Orthopedics/Podiatry;  Laterality: Left;  POPLITEAL/SAPHENOUS BLOCK   bilateral breast surgery      benign tumors removed   CHOLECYSTECTOMY  2007   COLON RESECTION  2006   COLONOSCOPY  2019   HAND SURGERY  2010   repair of incisional hernia  2012   SHOULDER ARTHROSCOPY Left 2009   SHOULDER SURGERY Right 1997   rotator cuff   TONSILLECTOMY     as a child   TOTAL KNEE ARTHROPLASTY Left 08/11/2021   Procedure: TOTAL KNEE ARTHROPLASTY;  Surgeon: Beverley Evalene BIRCH, MD;  Location: [facility] ORS;  Service: Orthopedics;  Laterality: Left;   TOTAL KNEE ARTHROPLASTY Right 06/29/2022   Procedure: TOTAL KNEE ARTHROPLASTY;  Surgeon: Beverley Evalene BIRCH, MD;  Location: [facility] ORS;  Service: Orthopedics;  Laterality: Right;  VIDEO BRONCHOSCOPY WITH ENDOBRONCHIAL NAVIGATION N/A 04/23/2024   Procedure: VIDEO BRONCHOSCOPY WITH ENDOBRONCHIAL NAVIGATION;  Surgeon: Shelah Lamar RAMAN, MD;   Location: [facility] ENDOSCOPY;  Service: Pulmonary;  Laterality: N/A;  lingular nodule   Family History  Problem Relation Age of Onset   Heart disease Mother        Previous MI at age 75-55   Atrial fibrillation Mother    Stroke Mother    Heart failure Mother    Seizures Father        poss d/t alcohol   Cancer Father 49       Prostate cancer    Diabetes Sister    Arrhythmia Sister    Cancer Maternal Aunt        lung cancer    Cancer Maternal Uncle        kidney ca   Social History:  reports that she has been smoking cigarettes and e-cigarettes. She started smoking about 54 years ago. She has a 41.1 pack-year smoking history. She has never used smokeless tobacco. She reports that she does not drink alcohol and does not use drugs. Allergies:  Allergies  Allergen Reactions   Bc Cherry [Aspirin -Caffeine] Anaphylaxis   Benadryl  [Diphenhydramine  Hcl] Nausea And Vomiting and Other (See Comments)    Migraine headaches.   Lansoprazole Nausea And Vomiting and Other (See Comments)    Migraine headache  Prevacid* due to dye in med    Morphine Sulfate Nausea And Vomiting and Other (See Comments)    Migraine headaches.   Red Dye #40 (Allura Red) Other (See Comments)    Also allergic to [facility] Dye-migraine headaches.   Simvastatin Nausea And Vomiting and Other (See Comments)    Migraine    Cephalexin     Other Reaction(s): severe fatigue   Macrolides And Ketolides Nausea And Vomiting   Other     Brown dye,medicine with brown dye   Codeine Nausea And Vomiting   Penicillins Nausea And Vomiting and Other (See Comments)    INTOLERANCE >  NAUSEA & VOMITING YEAST INFECTION > CHANGE IN NORMAL FLORA   Shellfish Allergy Other (See Comments)    Congestion  Other Reaction(s): migraine   Sulfa Antibiotics Nausea And Vomiting   Medications Prior to Admission  Medication Sig Dispense Refill   albuterol  (VENTOLIN  HFA) 108 (90 Base) MCG/ACT inhaler Inhale 1-2 puffs into the lungs every 4 (four) hours  as needed for wheezing or shortness of breath.     amitriptyline  (ELAVIL ) 25 MG tablet Take 100 mg by mouth at bedtime.     ARIPiprazole  (ABILIFY ) 20 MG tablet Take 20 mg by mouth daily.     calcium  carbonate (TUMS - DOSED IN MG ELEMENTAL CALCIUM ) 500 MG chewable tablet Chew 2 tablets by mouth 4 (four) times daily as needed for indigestion or heartburn.     Cholecalciferol  1.25 MG (50000 UT) capsule Take 50,000 Units by mouth every 7 (seven) days.     famotidine  (PEPCID ) 20 MG tablet Take 20 mg by mouth at bedtime.     gabapentin  (NEURONTIN ) 100 MG capsule Take 100 mg by mouth 2 (two) times daily.     metFORMIN  (GLUCOPHAGE -XR) 500 MG 24 hr tablet Take 1,000 mg by mouth 2 (two) times daily.     mupirocin ointment (BACTROBAN) 2 % Apply 1 Application topically daily as needed (Blisters).     rosuvastatin  (CRESTOR ) 5 MG tablet Take 5 mg by mouth daily.     valsartan-hydrochlorothiazide (DIOVAN-HCT) 320-12.5 MG tablet Take 1 tablet  by mouth daily.       Blood pressure 138/79, pulse 73, temperature 97.7 F (36.5 C), temperature source Oral, resp. rate 20, SpO2 100%. Physical Exam Constitutional:      General: She is not in acute distress. HENT:     Head: Normocephalic.     Right Ear: External ear normal.     Left Ear: External ear normal.     Nose: Nose normal.     Mouth/Throat:     Mouth: Mucous membranes are moist.  Eyes:     Conjunctiva/sclera: Conjunctivae normal.  Cardiovascular:     Rate and Rhythm: Normal rate and regular rhythm.  Pulmonary:     Effort: Pulmonary effort is normal.  Abdominal:     General: Bowel sounds are normal.  Musculoskeletal:        General: No swelling.     Cervical back: Normal range of motion.  Skin:    Findings: Lesion present.     Comments: Numerous plaques, abrasions, wounds  Neurological:     Mental Status: She is alert.     Comments: Alert and oriented x 3. Normal insight and awareness. Intact Memory. Normal language and speech. Cranial nerve  exam unremarkable. MMT: RUE 5/5, RLE 4+ to 5/5 prox to distal. [name] 4-/5 prox to distal. [name] 4- prox to 4/5 distally. Decreased FMC. [name] and [name]. Mild left limb ataxia. Sensory function 1+/2 LT left arm and leg. No abnl resting tone. Toes down  Psychiatric:        Mood and Affect: Mood normal.        Behavior: Behavior normal.     Results for orders placed or performed during the hospital encounter of 05/11/24 (from the past 24 hours)  Glucose, capillary     Status: Abnormal   Collection Time: 05/13/24 12:04 PM  Result Value Ref Range   Glucose-Capillary 222 (H) 70 - 99 mg/dL  Glucose, capillary     Status: Abnormal   Collection Time: 05/13/24  4:31 PM  Result Value Ref Range   Glucose-Capillary 300 (H) 70 - 99 mg/dL  Glucose, capillary     Status: Abnormal   Collection Time: 05/13/24  9:08 PM  Result Value Ref Range   Glucose-Capillary 269 (H) 70 - 99 mg/dL   Comment 1 Notify RN    Comment 2 Document in Chart   Basic metabolic panel with GFR     Status: Abnormal   Collection Time: 05/14/24  4:18 AM  Result Value Ref Range   Sodium 136 135 - 145 mmol/L   Potassium 4.7 3.5 - 5.1 mmol/L   Chloride 98 98 - 111 mmol/L   CO2 27 22 - 32 mmol/L   Glucose, Bld 230 (H) 70 - 99 mg/dL   BUN 20 8 - 23 mg/dL   Creatinine, Ser 9.25 0.44 - 1.00 mg/dL   Calcium  9.2 8.9 - 10.3 mg/dL   GFR, Estimated >39 >39 mL/min   Anion gap 11 5 - 15  CBC     Status: Abnormal   Collection Time: 05/14/24  4:18 AM  Result Value Ref Range   WBC 15.4 (H) 4.0 - 10.5 K/uL   RBC 3.56 (L) 3.87 - 5.11 MIL/uL   Hemoglobin 11.4 (L) 12.0 - 15.0 g/dL   HCT 66.1 (L) 63.9 - 53.9 %   MCV 94.9 80.0 - 100.0 fL   MCH 32.0 26.0 - 34.0 pg   MCHC 33.7 30.0 - 36.0 g/dL   RDW 87.5 88.4 - 84.4 %  Platelets 242 150 - 400 K/uL   nRBC 0.0 0.0 - 0.2 %  Phosphorus     Status: None   Collection Time: 05/14/24  4:18 AM  Result Value Ref Range   Phosphorus 3.9 2.5 - 4.6 mg/dL  Magnesium      Status: None   Collection Time:  05/14/24  4:18 AM  Result Value Ref Range   Magnesium  1.9 1.7 - 2.4 mg/dL  Glucose, capillary     Status: Abnormal   Collection Time: 05/14/24  6:41 AM  Result Value Ref Range   Glucose-Capillary 214 (H) 70 - 99 mg/dL   Comment 1 Notify RN    Comment 2 Document in Chart    ECHOCARDIOGRAM COMPLETE Result Date: 05/13/2024    ECHOCARDIOGRAM REPORT   Patient Name:   CAILYN HOUDEK Date of Exam: 05/13/2024 Medical Rec #:  [id number]       Height:       64.0 in Accession #:    [id number]      Weight:       177.0 lb Date of Birth:  January 24, 1951        BSA:          1.857 m Patient Age:    73 years        BP:           140/81 mmHg Patient Gender: F               HR:           77 bpm. Exam Location:  Inpatient Procedure: 2D Echo, Cardiac Doppler and Color Doppler (Both Spectral and Color            Flow Doppler were utilized during procedure). Indications:    Stroke I63.9  History:        Patient has prior history of Echocardiogram examinations, most                 recent 11/26/2020. COPD; Risk Factors:Hypertension and Diabetes.  Sonographer:    Jayson Gaskins Referring Phys: [id number] VASUNDHRA RATHORE IMPRESSIONS  1. Left ventricular ejection fraction, by estimation, is 60 to 65%. The left ventricle has normal function. The left ventricle has no regional wall motion abnormalities. There is mild concentric left ventricular hypertrophy. Left ventricular diastolic parameters are indeterminate.  2. Right ventricular systolic function is normal. The right ventricular size is normal. Tricuspid regurgitation signal is inadequate for assessing PA pressure.  3. The mitral valve is degenerative. Trivial mitral valve regurgitation.  4. The aortic valve is tricuspid. Aortic valve regurgitation is not visualized. Aortic valve sclerosis/calcification is present, without any evidence of aortic stenosis. Aortic valve mean gradient measures 3.0 mmHg.  5. The inferior vena cava is dilated in size with <50% respiratory variability,  suggesting right atrial pressure of 15 mmHg. Comparison(s): Prior images reviewed side by side. LVEF 60-65%. No obvious interatrial shunting. FINDINGS  Left Ventricle: Left ventricular ejection fraction, by estimation, is 60 to 65%. The left ventricle has normal function. The left ventricle has no regional wall motion abnormalities. The left ventricular internal cavity size was normal in size. There is  mild concentric left ventricular hypertrophy. Left ventricular diastolic parameters are indeterminate. Right Ventricle: The right ventricular size is normal. No increase in right ventricular wall thickness. Right ventricular systolic function is normal. Tricuspid regurgitation signal is inadequate for assessing PA pressure. Left Atrium: Left atrial size was normal in size. Right Atrium: Right atrial size was normal in size. Pericardium: There  is no evidence of pericardial effusion. Presence of epicardial fat layer. Mitral Valve: The mitral valve is degenerative in appearance. Mild to moderate mitral annular calcification. Trivial mitral valve regurgitation. MV peak gradient, 8.0 mmHg. The mean mitral valve gradient is 4.0 mmHg. Tricuspid Valve: The tricuspid valve is grossly normal. Tricuspid valve regurgitation is trivial. Aortic Valve: The aortic valve is tricuspid. There is mild aortic valve annular calcification. Aortic valve regurgitation is not visualized. Aortic valve sclerosis/calcification is present, without any evidence of aortic stenosis. Aortic valve mean gradient measures 3.0 mmHg. Aortic valve peak gradient measures 5.0 mmHg. Aortic valve area, by VTI measures 2.05 cm. Pulmonic Valve: The pulmonic valve was not well visualized. Pulmonic valve regurgitation is trivial. Aorta: The aortic root and ascending aorta are structurally normal, with no evidence of dilitation. Venous: The inferior vena cava is dilated in size with less than 50% respiratory variability, suggesting right atrial pressure of 15  mmHg. IAS/Shunts: No atrial level shunt detected by color flow Doppler. Additional Comments: 3D was performed not requiring image post processing on an independent workstation and was indeterminate.  LEFT VENTRICLE PLAX 2D LVIDd:         3.90 cm   Diastology LVIDs:         2.60 cm   LV e' medial:    7.40 cm/s LV PW:         1.10 cm   LV E/e' medial:  16.1 LV IVS:        1.20 cm   LV e' lateral:   8.92 cm/s LVOT diam:     1.80 cm   LV E/e' lateral: 13.3 LV SV:         46 LV SV Index:   25 LVOT Area:     2.54 cm  RIGHT VENTRICLE RV S prime:     20.10 cm/s TAPSE (M-mode): 2.2 cm LEFT ATRIUM           Index        RIGHT ATRIUM          Index LA Vol (A2C): 34.4 ml 18.52 ml/m  RA Area:     9.68 cm LA Vol (A4C): 51.5 ml 27.73 ml/m  RA Volume:   17.50 ml 9.42 ml/m  AORTIC VALVE AV Area (Vmax):    2.10 cm AV Area (Vmean):   2.02 cm AV Area (VTI):     2.05 cm AV Vmax:           112.00 cm/s AV Vmean:          82.300 cm/s AV VTI:            0.225 m AV Peak Grad:      5.0 mmHg AV Mean Grad:      3.0 mmHg LVOT Vmax:         92.60 cm/s LVOT Vmean:        65.200 cm/s LVOT VTI:          0.181 m LVOT/AV VTI ratio: 0.80  AORTA Ao Root diam: 3.10 cm MITRAL VALVE MV Area (PHT): 3.50 cm     SHUNTS MV Area VTI:   1.19 cm     Systemic VTI:  0.18 m MV Peak grad:  8.0 mmHg     Systemic Diam: 1.80 cm MV Mean grad:  4.0 mmHg MV Vmax:       1.42 m/s MV Vmean:      92.9 cm/s MV Decel Time: 217 msec MV E velocity: 119.00 cm/s MV  A velocity: 123.00 cm/s MV E/A ratio:  0.97 Jayson Sierras MD Electronically signed by Jayson Sierras MD Signature Date/Time: 05/13/2024/12:39:45 PM    Final    VAS US  LOWER EXTREMITY VENOUS (DVT) Result Date: 05/12/2024  Lower Venous DVT Study Patient Name:  RUTHE ROEMER  Date of Exam:   05/12/2024 Medical Rec #: [id number]        Accession #:    [id number] Date of Birth: 05-18-1951         Patient Gender: F Patient Age:   83 years Exam Location:  [facility] Procedure:      VAS US  LOWER EXTREMITY  VENOUS (DVT) Referring Phys: ARY XU --------------------------------------------------------------------------------  Indications: Stroke in cancer patient.  Risk Factors: Newly diagnosed squamous cell cancer of the lung. Comparison Study: Prior negative left LEV done 04/15/20 at [facility] Performing Technologist: Alberta Lis RVS  Examination Guidelines: A complete evaluation includes B-mode imaging, spectral Doppler, color Doppler, and power Doppler as needed of all accessible portions of each vessel. Bilateral testing is considered an integral part of a complete examination. Limited examinations for reoccurring indications may be performed as noted. The reflux portion of the exam is performed with the patient in reverse Trendelenburg.  +---------+---------------+---------+-----------+----------+--------------+ RIGHT    CompressibilityPhasicitySpontaneityPropertiesThrombus Aging +---------+---------------+---------+-----------+----------+--------------+ CFV      Full           Yes      Yes                                 +---------+---------------+---------+-----------+----------+--------------+ SFJ      Full                                                        +---------+---------------+---------+-----------+----------+--------------+ FV Prox  Full           Yes      Yes                                 +---------+---------------+---------+-----------+----------+--------------+ FV Mid   Full           Yes      Yes                                 +---------+---------------+---------+-----------+----------+--------------+ FV DistalFull           Yes      Yes                                 +---------+---------------+---------+-----------+----------+--------------+ PFV      Full           Yes      Yes                                 +---------+---------------+---------+-----------+----------+--------------+ POP      Full                                                         +---------+---------------+---------+-----------+----------+--------------+  PTV      Full                                                        +---------+---------------+---------+-----------+----------+--------------+ PERO     Full                                                        +---------+---------------+---------+-----------+----------+--------------+   +---------+---------------+---------+-----------+----------+--------------+ LEFT     CompressibilityPhasicitySpontaneityPropertiesThrombus Aging +---------+---------------+---------+-----------+----------+--------------+ CFV      Full           Yes      Yes                                 +---------+---------------+---------+-----------+----------+--------------+ SFJ      Full                                                        +---------+---------------+---------+-----------+----------+--------------+ FV Prox  Full           Yes      Yes                                 +---------+---------------+---------+-----------+----------+--------------+ FV Mid   Full                                                        +---------+---------------+---------+-----------+----------+--------------+ FV DistalFull                                                        +---------+---------------+---------+-----------+----------+--------------+ PFV      Full           Yes      Yes                                 +---------+---------------+---------+-----------+----------+--------------+ POP      Full           Yes      Yes                                 +---------+---------------+---------+-----------+----------+--------------+ PTV      Full                                                        +---------+---------------+---------+-----------+----------+--------------+  PERO     Full                                                         +---------+---------------+---------+-----------+----------+--------------+     Summary: BILATERAL: - No evidence of deep vein thrombosis seen in the lower extremities, bilaterally. -No evidence of popliteal cyst, bilaterally.   *See table(s) above for measurements and observations. Electronically signed by Gaile New MD on 05/12/2024 at 10:12:34 PM.    Final     Assessment/Plan: Diagnosis: 73 year old female with right MCA, ACA embolic infarcts  Does the need for close, 24 hr/day medical supervision in concert with the patient's rehab needs make it unreasonable for this patient to be served in a less intensive setting? Yes Co-Morbidities requiring supervision/potential complications:  - Recently diagnosed stage Ia lung cancer -Diabetes -Hypertension -Tobacco use Due to bladder management, bowel management, safety, skin/wound care, disease management, medication administration, pain management, and patient education, does the patient require 24 hr/day rehab nursing? Yes Does the patient require coordinated care of a physician, rehab nurse, therapy disciplines of PT, OT to address physical and functional deficits in the context of the above medical diagnosis(es)? Yes Addressing deficits in the following areas: balance, endurance, locomotion, strength, transferring, bowel/bladder control, bathing, dressing, feeding, grooming, toileting, and psychosocial support Can the patient actively participate in an intensive therapy program of at least 3 hrs of therapy per day at least 5 days per week? Yes The potential for patient to make measurable gains while on inpatient rehab is excellent Anticipated functional outcomes upon discharge from inpatient rehab are modified independent and supervision  with PT, modified independent and supervision with OT, n/a with SLP. Estimated rehab length of stay to reach the above functional goals is: 8-13 days Anticipated discharge destination: Home [place]  Rehab/Functional Prognosis: excellent  POST ACUTE RECOMMENDATIONS: This patient's condition is appropriate for continued rehabilitative care in the following setting: CIR Patient has agreed to participate in recommended program. Yes Note that insurance prior authorization may be required for reimbursement for recommended care.  Comment: Pt motivated, wants to regain independence. Spouse available for assit Rehab Admissions Coordinator to follow up.       I have personally performed a face to face diagnostic evaluation of this patient. Additionally, I have examined the patient's medical record including any pertinent labs and radiographic images.    Thanks,  Arthea ONEIDA Gunther, MD 05/14/2024

## 2024-05-14 NOTE — Progress Notes (Signed)
 PROGRESS NOTE    [name]  FMW:[id number] DOB: June 25, 1951 DOA: 05/11/2024 PCP: [name], FNP    Brief Narrative:   73 year old with history of SCC of the lung, COPD with ongoing tobacco use, HLD, depression, DM2, HTN presented with shortness of breath possible allergic reaction to BC powder.  Noted to have hypoxia and facial edema.  Patient was given Solu-Medrol , epinephrine  and Zofran  with EMS.  Also noted to have left-sided neglect with weakness.  Code stroke was activated and eventually workup was consistent with multifocal infarct, likely embolic source.  Routine workup was performed by neurology team and eventually determined patient will be on daily Plavix and statin.  She is allergic to aspirin .  Assessment & Plan:  Acute multifocal infarct Left-sided weakness - Patient was outside of TNK window.  CT head, CTA head and neck were unremarkable.  MRI was consistent with right-sided multifocal infarct.  LDL 23, A1c 7.4, UDS was negative. -Daily Plavix and statin.  Allergic to aspirin  - Echocardiogram preserved EF of 65% with mild LVH.  Tele showed bradycardia and a very brief pause. No AV nodal blockers.  Briefly discussed with EP, no further workup indicated at this time.  Continue with plan for outpatient loop recorder  Acute hypoxia Acute COPD exacerbation - There was a question of possible BC powder allergic reaction due to facial swelling and hypoxia therefore received Solu-Medrol , epinephrine , Pepcid , albuterol .  CTA chest was negative for PE.  Continue bronchodilators, I-S/flutter valve.  Continue to wean off oxygen .  Transition to p.o. steroids upon discharge   Acute kidney injury -Creatinine peaked 1.2, now resolved at baseline 0.8  Leukocytosis - Suspect reactive and secondary to steroids.  Will check UA and trend.  No need for antibiotics at this time  Hypokalemia - As needed repletion  Diabetes mellitus type 2, hyperglycemia steroid-induced - A1c 7.4.   Holding home p.o. regimen - Sliding scale and Accu-Cheks, add long-acting.  Adjust as necessary  Essential hypertension - Allowing permissive hypertension  Squamous cell carcinoma of the lung Active tobacco use - Follows outpatient oncology team  Hyperlipidemia - Statin  Mood disorder - Amitriptyline  and appeared preserved  Chronic pain - Gabapentin   Tobacco use - Nicotine patch   DVT prophylaxis: Lovenox     Code Status: Full Code Family Communication:   Status is: Inpatient Remains inpatient appropriate because: Will need rehab   PT Follow up Recs: Acute Inpatient Rehab (3hours/Day)05/13/2024 0900  Subjective:  Seen at bedside no complaints.  Examination:  General exam: Appears calm and comfortable  Respiratory system: b/l rhonchi Cardiovascular system: S1 & S2 heard, RRR. No JVD, murmurs, rubs, gallops or clicks. No pedal edema. Gastrointestinal system: Abdomen is nondistended, soft and nontender. No organomegaly or masses felt. Normal bowel sounds heard. Central nervous system: Alert and oriented. No focal neurological deficits. Extremities: Symmetric 5 x 5 power. Skin: No rashes, lesions or ulcers Psychiatry: Judgement and insight appear normal. Mood & affect appropriate.                Diet Orders (From admission, onward)     Start     Ordered   05/11/24 2203  Diet heart healthy/carb modified Room service appropriate? Yes; Fluid consistency: Thin  Diet effective now       Question Answer Comment  Diet-HS Snack? Nothing   Room service appropriate? Yes   Fluid consistency: Thin      05/11/24 2202  Objective: Vitals:   05/13/24 2032 05/13/24 2325 05/14/24 0355 05/14/24 0935  BP:  132/79  138/79  Pulse:  71  73  Resp:  20    Temp:  98.2 F (36.8 C) 98.3 F (36.8 C) 97.7 F (36.5 C)  TempSrc:  Oral Oral Oral  SpO2: 93% 91%  100%    Intake/Output Summary (Last 24 hours) at 05/14/2024 1100 Last data filed at  05/14/2024 9062 Gross per 24 hour  Intake 120 ml  Output 2850 ml  Net -2730 ml   There were no vitals filed for this visit.  Scheduled Meds:  amitriptyline   100 mg Oral QHS   ARIPiprazole   20 mg Oral Daily   budesonide (PULMICORT) nebulizer solution  0.25 mg Nebulization BID   clopidogrel  75 mg Oral Daily   enoxaparin (LOVENOX) injection  40 mg Subcutaneous Q24H   famotidine   20 mg Oral BID   gabapentin   100 mg Oral BID   insulin  aspart  0-5 Units Subcutaneous QHS   insulin  aspart  0-9 Units Subcutaneous TID WC   insulin  glargine  10 Units Subcutaneous Daily   ipratropium-albuterol   3 mL Nebulization TID   methylPREDNISolone  (SOLU-MEDROL ) injection  40 mg Intravenous Q12H   nicotine  14 mg Transdermal Daily   rosuvastatin   5 mg Oral Daily   Continuous Infusions:  Nutritional status     There is no height or weight on file to calculate BMI.  Data Reviewed:   CBC: Recent Labs  Lab 05/11/24 1300 05/11/24 1848 05/12/24 0556 05/14/24 0418  WBC 11.7*  --  21.6* 15.4*  NEUTROABS 7.1  --   --   --   HGB 14.6 12.9 11.8* 11.4*  HCT 45.6 38.0 35.7* 33.8*  MCV 97.6  --  96.2 94.9  PLT 273  --  238 242   Basic Metabolic Panel: Recent Labs  Lab 05/11/24 1437 05/11/24 1837 05/11/24 1848 05/12/24 0556 05/14/24 0418  NA 140 137 138 136 136  K 3.6 3.2* 3.1* 4.9 4.7  CL 102 99 98 102 98  CO2 23 23  --  25 27  GLUCOSE 252* 256* 256* 205* 230*  BUN 18 17 17 16 20   CREATININE 1.14* 1.15* 1.20* 0.87 0.74  CALCIUM  9.7 9.3  --  8.7* 9.2  MG  --   --   --   --  1.9  PHOS  --   --   --   --  3.9   GFR: Estimated Creatinine Clearance: 64.2 mL/min (by C-G formula based on SCr of 0.74 mg/dL). Liver Function Tests: Recent Labs  Lab 05/11/24 1837  AST 27  ALT 12  ALKPHOS 74  BILITOT 0.2  PROT 5.9*  ALBUMIN 3.6   No results for input(s): LIPASE, AMYLASE in the last 168 hours. No results for input(s): AMMONIA in the last 168 hours. Coagulation Profile: Recent  Labs  Lab 05/11/24 1837  INR 1.1   Cardiac Enzymes: No results for input(s): CKTOTAL, CKMB, CKMBINDEX, TROPONINI in the last 168 hours. BNP (last 3 results) No results for input(s): PROBNP in the last 8760 hours. HbA1C: Recent Labs    05/11/24 1837  HGBA1C 7.4*   CBG: Recent Labs  Lab 05/13/24 0616 05/13/24 1204 05/13/24 1631 05/13/24 2108 05/14/24 0641  GLUCAP 302* 222* 300* 269* 214*   Lipid Profile: Recent Labs    05/12/24 0556  CHOL 101  HDL 45  LDLCALC 23  TRIG 166*  CHOLHDL 2.3   Thyroid  Function Tests: No  results for input(s): TSH, T4TOTAL, FREET4, T3FREE, THYROIDAB in the last 72 hours. Anemia Panel: No results for input(s): VITAMINB12, FOLATE, FERRITIN, TIBC, IRON, RETICCTPCT in the last 72 hours. Sepsis Labs: No results for input(s): PROCALCITON, LATICACIDVEN in the last 168 hours.  No results found for this or any previous visit (from the past 240 hours).       Radiology Studies: ECHOCARDIOGRAM COMPLETE Result Date: 05/13/2024    ECHOCARDIOGRAM REPORT   Patient Name:   [name] Date of Exam: 05/13/2024 Medical Rec #:  [id number]       Height:       64.0 in Accession #:    [id number]      Weight:       177.0 lb Date of Birth:  1950/09/16        BSA:          1.857 m Patient Age:    73 years        BP:           140/81 mmHg Patient Gender: F               HR:           77 bpm. Exam Location:  Inpatient Procedure: 2D Echo, Cardiac Doppler and Color Doppler (Both Spectral and Color            Flow Doppler were utilized during procedure). Indications:    Stroke I63.9  History:        Patient has prior history of Echocardiogram examinations, most                 recent 11/26/2020. COPD; Risk Factors:Hypertension and Diabetes.  Sonographer:    Jayson Gaskins Referring Phys: [id number] VASUNDHRA RATHORE IMPRESSIONS  1. Left ventricular ejection fraction, by estimation, is 60 to 65%. The left ventricle has normal function. The  left ventricle has no regional wall motion abnormalities. There is mild concentric left ventricular hypertrophy. Left ventricular diastolic parameters are indeterminate.  2. Right ventricular systolic function is normal. The right ventricular size is normal. Tricuspid regurgitation signal is inadequate for assessing PA pressure.  3. The mitral valve is degenerative. Trivial mitral valve regurgitation.  4. The aortic valve is tricuspid. Aortic valve regurgitation is not visualized. Aortic valve sclerosis/calcification is present, without any evidence of aortic stenosis. Aortic valve mean gradient measures 3.0 mmHg.  5. The inferior vena cava is dilated in size with <50% respiratory variability, suggesting right atrial pressure of 15 mmHg. Comparison(s): Prior images reviewed side by side. LVEF 60-65%. No obvious interatrial shunting. FINDINGS  Left Ventricle: Left ventricular ejection fraction, by estimation, is 60 to 65%. The left ventricle has normal function. The left ventricle has no regional wall motion abnormalities. The left ventricular internal cavity size was normal in size. There is  mild concentric left ventricular hypertrophy. Left ventricular diastolic parameters are indeterminate. Right Ventricle: The right ventricular size is normal. No increase in right ventricular wall thickness. Right ventricular systolic function is normal. Tricuspid regurgitation signal is inadequate for assessing PA pressure. Left Atrium: Left atrial size was normal in size. Right Atrium: Right atrial size was normal in size. Pericardium: There is no evidence of pericardial effusion. Presence of epicardial fat layer. Mitral Valve: The mitral valve is degenerative in appearance. Mild to moderate mitral annular calcification. Trivial mitral valve regurgitation. MV peak gradient, 8.0 mmHg. The mean mitral valve gradient is 4.0 mmHg. Tricuspid Valve: The tricuspid valve is grossly normal. Tricuspid valve regurgitation is  trivial.  Aortic Valve: The aortic valve is tricuspid. There is mild aortic valve annular calcification. Aortic valve regurgitation is not visualized. Aortic valve sclerosis/calcification is present, without any evidence of aortic stenosis. Aortic valve mean gradient measures 3.0 mmHg. Aortic valve peak gradient measures 5.0 mmHg. Aortic valve area, by VTI measures 2.05 cm. Pulmonic Valve: The pulmonic valve was not well visualized. Pulmonic valve regurgitation is trivial. Aorta: The aortic root and ascending aorta are structurally normal, with no evidence of dilitation. Venous: The inferior vena cava is dilated in size with less than 50% respiratory variability, suggesting right atrial pressure of 15 mmHg. IAS/Shunts: No atrial level shunt detected by color flow Doppler. Additional Comments: 3D was performed not requiring image post processing on an independent workstation and was indeterminate.  LEFT VENTRICLE PLAX 2D LVIDd:         3.90 cm   Diastology LVIDs:         2.60 cm   LV e' medial:    7.40 cm/s LV PW:         1.10 cm   LV E/e' medial:  16.1 LV IVS:        1.20 cm   LV e' lateral:   8.92 cm/s LVOT diam:     1.80 cm   LV E/e' lateral: 13.3 LV SV:         46 LV SV Index:   25 LVOT Area:     2.54 cm  RIGHT VENTRICLE RV S prime:     20.10 cm/s TAPSE (M-mode): 2.2 cm LEFT ATRIUM           Index        RIGHT ATRIUM          Index LA Vol (A2C): 34.4 ml 18.52 ml/m  RA Area:     9.68 cm LA Vol (A4C): 51.5 ml 27.73 ml/m  RA Volume:   17.50 ml 9.42 ml/m  AORTIC VALVE AV Area (Vmax):    2.10 cm AV Area (Vmean):   2.02 cm AV Area (VTI):     2.05 cm AV Vmax:           112.00 cm/s AV Vmean:          82.300 cm/s AV VTI:            0.225 m AV Peak Grad:      5.0 mmHg AV Mean Grad:      3.0 mmHg LVOT Vmax:         92.60 cm/s LVOT Vmean:        65.200 cm/s LVOT VTI:          0.181 m LVOT/AV VTI ratio: 0.80  AORTA Ao Root diam: 3.10 cm MITRAL VALVE MV Area (PHT): 3.50 cm     SHUNTS MV Area VTI:   1.19 cm     Systemic VTI:   0.18 m MV Peak grad:  8.0 mmHg     Systemic Diam: 1.80 cm MV Mean grad:  4.0 mmHg MV Vmax:       1.42 m/s MV Vmean:      92.9 cm/s MV Decel Time: 217 msec MV E velocity: 119.00 cm/s MV A velocity: 123.00 cm/s MV E/A ratio:  0.97 Jayson Sierras MD Electronically signed by Jayson Sierras MD Signature Date/Time: 05/13/2024/12:39:45 PM    Final    VAS US  LOWER EXTREMITY VENOUS (DVT) Result Date: 05/12/2024  Lower Venous DVT Study Patient Name:  [name] Smock  Date of Exam:  05/12/2024 Medical Rec #: [id number]        Accession #:    [id number] Date of Birth: 1951-02-07         Patient Gender: F Patient Age:   34 years Exam Location:  [facility] Procedure:      VAS US  LOWER EXTREMITY VENOUS (DVT) Referring Phys: ARY XU --------------------------------------------------------------------------------  Indications: Stroke in cancer patient.  Risk Factors: Newly diagnosed squamous cell cancer of the lung. Comparison Study: Prior negative left LEV done 04/15/20 at [facility] Performing Technologist: Alberta Lis RVS  Examination Guidelines: A complete evaluation includes B-mode imaging, spectral Doppler, color Doppler, and power Doppler as needed of all accessible portions of each vessel. Bilateral testing is considered an integral part of a complete examination. Limited examinations for reoccurring indications may be performed as noted. The reflux portion of the exam is performed with the patient in reverse Trendelenburg.  +---------+---------------+---------+-----------+----------+--------------+ RIGHT    CompressibilityPhasicitySpontaneityPropertiesThrombus Aging +---------+---------------+---------+-----------+----------+--------------+ CFV      Full           Yes      Yes                                 +---------+---------------+---------+-----------+----------+--------------+ SFJ      Full                                                         +---------+---------------+---------+-----------+----------+--------------+ FV Prox  Full           Yes      Yes                                 +---------+---------------+---------+-----------+----------+--------------+ FV Mid   Full           Yes      Yes                                 +---------+---------------+---------+-----------+----------+--------------+ FV DistalFull           Yes      Yes                                 +---------+---------------+---------+-----------+----------+--------------+ PFV      Full           Yes      Yes                                 +---------+---------------+---------+-----------+----------+--------------+ POP      Full                                                        +---------+---------------+---------+-----------+----------+--------------+ PTV      Full                                                        +---------+---------------+---------+-----------+----------+--------------+  PERO     Full                                                        +---------+---------------+---------+-----------+----------+--------------+   +---------+---------------+---------+-----------+----------+--------------+ LEFT     CompressibilityPhasicitySpontaneityPropertiesThrombus Aging +---------+---------------+---------+-----------+----------+--------------+ CFV      Full           Yes      Yes                                 +---------+---------------+---------+-----------+----------+--------------+ SFJ      Full                                                        +---------+---------------+---------+-----------+----------+--------------+ FV Prox  Full           Yes      Yes                                 +---------+---------------+---------+-----------+----------+--------------+ FV Mid   Full                                                         +---------+---------------+---------+-----------+----------+--------------+ FV DistalFull                                                        +---------+---------------+---------+-----------+----------+--------------+ PFV      Full           Yes      Yes                                 +---------+---------------+---------+-----------+----------+--------------+ POP      Full           Yes      Yes                                 +---------+---------------+---------+-----------+----------+--------------+ PTV      Full                                                        +---------+---------------+---------+-----------+----------+--------------+ PERO     Full                                                        +---------+---------------+---------+-----------+----------+--------------+  Summary: BILATERAL: - No evidence of deep vein thrombosis seen in the lower extremities, bilaterally. -No evidence of popliteal cyst, bilaterally.   *See table(s) above for measurements and observations. Electronically signed by Gaile New MD on 05/12/2024 at 10:12:34 PM.    Final            LOS: 2 days   Time spent= 35 mins    Burgess JAYSON Dare, MD [facility]  If 7PM-7AM, please contact night-coverage  05/14/2024, 11:00 AM

## 2024-05-14 NOTE — TOC CAGE-AID Note (Signed)
 Transition of Care [facility] - CAGE-AID Screening   Patient Details  Name: [name] MRN: [id number] Date of Birth: 03/08/1951  [facility]) CM/SW Contact:    Chaz Mcglasson E Brittaney Beaulieu, LCSW Phone Number: 05/14/2024, 9:08 AM   Clinical Narrative: No SA noted.   CAGE-AID Screening:    Have You Ever Felt You Ought to Cut Down on Your Drinking or Drug Use?: No Have People Annoyed You By Critizing Your Drinking Or Drug Use?: No Have You Felt Bad Or Guilty About Your Drinking Or Drug Use?: No Have You Ever Had a Drink or Used Drugs First Thing In The Morning to Steady Your Nerves or to Get Rid of a Hangover?: No CAGE-AID Score: 0  Substance Abuse Education Offered: No

## 2024-05-14 NOTE — PMR Pre-admission (Signed)
 PMR Admission Coordinator Pre-Admission Assessment  Patient: [name] is an 73 y.o., female MRN: [id number] DOB: [birth year] Height:   Weight:    Insurance Information HMO: yes    PPO:      PCP:      IPA:      80/20:      OTHER:  PRIMARY: BCBS Medicare      Policy#: [id number]      Subscriber: pt CM Name: harlene      Phone#: n/p     Fax#: [phone]  Pre-Cert#: [id number] auth for CIR from harlene with [facility] Medicare for admit 10/16 with next review date 10/22.  Updates due to [facility] at fax listed above.        Employer:  Benefits:  Phone #: [phone]     Name:  Eff. Date: 08/03/23     Deduct: $0      Out of Pocket Max: $3500 (met [phone])      Life Max: n/a CIR: $400/day for days 1-5      SNF: 20 full days Outpatient:      Co-Pay: $10/visit Home Health: 100%      Co-Pay:  DME: 80%     Co-Pay: 20% Providers:  SECONDARY:       Policy#:      Phone#:   Artist:       Phone#:   The Engineer, materials Information Summary" for patients in Inpatient Rehabilitation Facilities with attached "Privacy Act Statement-Health Care Records" was provided and verbally reviewed with: Patient and Family  Emergency Contact Information Contact Information     Name Relation Home Work Mobile   [name] Significant other   [phone]      Other Contacts   None on File     Current Medical History  Patient Admitting Diagnosis: CVA   History of Present Illness: Pt is a 73 y/o female with PMH of squamous cell carcinoma of the lung (newly diagnosed in Sept, stage 1A), COPD, tobacco use, coronary artery calcification, depression, DM, HTN, migraines who presented to ED on 10/10 with sudden onset SOB though to be allergic reaction to BC powder taken for headache.  Initial O2 on room air was 65% with dyspnea and facial swelling.  EMS gave solu-medrol , 2 rounds of epi, and zofran .  In ED pt tachypneic but satting 96% on 6L o2.  Code stroke activated as pt noted in ED to have L  neglect and hemiparesis.  She was outside the window for TNK.  Labs notable for D-dimer 7.84, ABG pH 7.31, pCO2 49, and pO2 88.  CTA negative for PE.  CT head negative, CTA head/neck showed LVO and CT perfusion showing no evidence of ischemia.  Neurology was consulted and stroke workup completed.  MRI confirmed R MCA/PCA/ACA infarcts, ECHO with EF 65%, A1C 7.4.  Recommendations for loop recorder and plavix daily given aspirin  allergy.  Of note, no brain mets noted on MRI.  Therapy evaluations completed and pt was recommended for CIR.  Complete NIHSS TOTAL: 4  Patient's medical record from [name] has been reviewed by the rehabilitation admission coordinator and physician.  Past Medical History  Past Medical History:  Diagnosis Date   Alcohol abuse, in remission    Anxiety    Cancer of sigmoid colon (HCC) 07/2005   [id number]   Chronic bronchitis    COPD (chronic obstructive pulmonary disease) (HCC)    Depression    Diabetes mellitus without complication (HCC)    GERD (gastroesophageal reflux disease)  History of thyroid  nodule    monitoring   Hyperlipidemia    Hypertension    Migraines    OA (osteoarthritis)    PONV (postoperative nausea and vomiting)    Reflux     Has the patient had major surgery during 100 days prior to admission? No  Family History   family history includes Arrhythmia in her sister; Atrial fibrillation in her mother; Cancer in her maternal aunt and maternal uncle; Cancer (age of onset: 34) in her father; Diabetes in her sister; Heart disease in her mother; Heart failure in her mother; Seizures in her father; Stroke in her mother.  Current Medications  Current Facility-Administered Medications:    acetaminophen  (TYLENOL ) tablet 650 mg, 650 mg, Oral, Q6H PRN, Rathore, Vasundhra, MD, 650 mg at 05/12/24 0536   amitriptyline  (ELAVIL ) tablet 100 mg, 100 mg, Oral, QHS, Rathore, Vasundhra, MD, 100 mg at 05/13/24 2135   ARIPiprazole  (ABILIFY ) tablet 20 mg, 20 mg,  Oral, Daily, Rathore, Vasundhra, MD, 20 mg at 05/14/24 0851   budesonide (PULMICORT) nebulizer solution 0.25 mg, 0.25 mg, Nebulization, BID, Rathore, Vasundhra, MD, 0.25 mg at 05/14/24 0827   clopidogrel (PLAVIX) tablet 75 mg, 75 mg, Oral, Daily, Rathore, Vasundhra, MD, 75 mg at 05/14/24 0851   enoxaparin (LOVENOX) injection 40 mg, 40 mg, Subcutaneous, Q24H, Rathore, Vasundhra, MD, 40 mg at 05/13/24 2134   famotidine  (PEPCID ) tablet 20 mg, 20 mg, Oral, BID, Barbarann Nest, MD, 20 mg at 05/14/24 9148   gabapentin  (NEURONTIN ) capsule 100 mg, 100 mg, Oral, BID, Rathore, Vasundhra, MD, 100 mg at 05/14/24 0851   glucagon (human recombinant) (GLUCAGEN) injection 1 mg, 1 mg, Intravenous, PRN, Amin, Ankit C, MD   insulin  aspart (novoLOG ) injection 0-5 Units, 0-5 Units, Subcutaneous, QHS, Rathore, Vasundhra, MD, 2 Units at 05/13/24 2137   insulin  aspart (novoLOG ) injection 0-9 Units, 0-9 Units, Subcutaneous, TID WC, Rathore, Vasundhra, MD, 2 Units at 05/14/24 1226   insulin  glargine (LANTUS) injection 10 Units, 10 Units, Subcutaneous, Daily, Amin, Ankit C, MD, 10 Units at 05/14/24 0905   ipratropium-albuterol  (DUONEB) 0.5-2.5 (3) MG/3ML nebulizer solution 3 mL, 3 mL, Nebulization, Q4H PRN, Amin, Ankit C, MD   [START ON 05/15/2024] ipratropium-albuterol  (DUONEB) 0.5-2.5 (3) MG/3ML nebulizer solution 3 mL, 3 mL, Nebulization, BID, Amin, Ankit C, MD   labetalol (NORMODYNE) injection 10 mg, 10 mg, Intravenous, Q2H PRN, Amin, Ankit C, MD   methylPREDNISolone  sodium succinate (SOLU-MEDROL ) 40 mg/mL injection 40 mg, 40 mg, Intravenous, Q12H, Rathore, Vasundhra, MD, 40 mg at 05/14/24 0851   nicotine (NICODERM CQ - dosed in mg/24 hours) patch 14 mg, 14 mg, Transdermal, Daily, Barbarann Nest, MD, 14 mg at 05/14/24 0855   ondansetron  (ZOFRAN ) injection 4 mg, 4 mg, Intravenous, Q6H PRN, Amin, Ankit C, MD, 4 mg at 05/13/24 1101   rosuvastatin  (CRESTOR ) tablet 5 mg, 5 mg, Oral, Daily, Rathore, Vasundhra, MD, 5 mg at  05/14/24 9148  Patients Current Diet:  Diet Order             Diet heart healthy/carb modified Room service appropriate? Yes; Fluid consistency: Thin  Diet effective now                   Precautions / Restrictions Precautions Precautions: Fall Restrictions Weight Bearing Restrictions Per Provider Order: No   Has the patient had 2 or more falls or a fall with injury in the past year? No  Prior Activity Level Community (5-7x/wk): independent prior to admit with no DME, driving, partner has MS  but is independent at baseline  Prior Functional Level Self Care: Did the patient need help bathing, dressing, using the toilet or eating? Independent  Indoor Mobility: Did the patient need assistance with walking from room to room (with or without device)? Independent  Stairs: Did the patient need assistance with internal or external stairs (with or without device)? Independent  Functional Cognition: Did the patient need help planning regular tasks such as shopping or remembering to take medications? Independent  Patient Information Are you of Hispanic, Latino/a,or Spanish origin?: A. No, not of Hispanic, Latino/a, or Spanish origin What is your race?: A. White Do you need or want an interpreter to communicate with a doctor or health care staff?: 0. No  Patient's Response To:  Health Literacy and Transportation Is the patient able to respond to health literacy and transportation needs?: Yes Health Literacy - How often do you need to have someone help you when you read instructions, pamphlets, or other written material from your doctor or pharmacy?: Never In the past 12 months, has lack of transportation kept you from medical appointments or from getting medications?: No In the past 12 months, has lack of transportation kept you from meetings, work, or from getting things needed for daily living?: No  Journalist, newspaper / Equipment Home Equipment: Grab bars - toilet, Grab bars  - tub/shower, Shower seat - built in, [place] - single point, [employer] - quad, Agricultural consultant (2 wheels)  Prior Device Use: Indicate devices/aids used by the patient prior to current illness, exacerbation or injury? None of the above  Current Functional Level Cognition  Arousal/Alertness: Awake/alert Overall Cognitive Status: Within Functional Limits for tasks assessed Orientation Level: Oriented X4 Attention: Sustained Sustained Attention: Appears intact Memory: Impaired Memory Impairment: Decreased recall of new information (66% accurate) Awareness: Appears intact Problem Solving: Appears intact    Extremity Assessment (includes Sensation/Coordination)  Upper Extremity Assessment: [name] deficits/detail [name] Deficits / Details: good strength, fair proprioception when tested but during functional activities Pt with L side neglect requiring verbal cueing for [name] placement and safety. [name] Sensation: decreased proprioception [name] Coordination: WNL  Lower Extremity Assessment: [name] deficits/detail [name] Deficits / Details: Lt hallux 3/5, ankle DF 4/5, ankle eversion 4/5 [name] Sensation:  (Sensation intact to light touch) [name] Coordination: decreased fine motor, decreased gross motor    ADLs  Overall ADL's : Needs assistance/impaired Eating/Feeding: Set up, Sitting Grooming: Set up, Sitting Upper Body Bathing: Minimal assistance, Moderate assistance, Sitting Lower Body Bathing: Maximal assistance, Sitting/lateral leans Upper Body Dressing : Minimal assistance, Sitting Lower Body Dressing: Maximal assistance, Sitting/lateral leans, Sit to/from stand Toilet Transfer: Moderate assistance, Rolling walker (2 wheels), BSC/3in1 Toileting- Clothing Manipulation and Hygiene: Minimal assistance, Moderate assistance, Sitting/lateral lean Functional mobility during ADLs: Moderate assistance, Rolling walker (2 wheels) General ADL Comments: Pt with L side neglect, L lateral lean with sitting/standing limiting  safety with ADLs and transfers. overall good strength and ROM but needs help with physical support and cueing for balance    Mobility  Overal bed mobility: Needs Assistance Bed Mobility: Supine to Sit, Sit to Supine Supine to sit: Min assist Sit to supine: Min assist, Used rails General bed mobility comments: min A in/out of bed, Pt with L side neglect and was not able to move [name] off EOB without verbal cueing, L lateral lean so physical assist to ensure did not fall off EOB    Transfers  Overall transfer level: Needs assistance Equipment used: Rolling walker (2 wheels) Transfers: Sit to/from Stand,  Bed to chair/wheelchair/BSC Sit to Stand: Min assist, Mod assist Bed to/from chair/wheelchair/BSC transfer type:: Step pivot Step pivot transfers: Mod assist General transfer comment: Good strength for boost but min/mod A due to L lateral lean at times, not able to recover without physical assist.    Ambulation / Gait / Stairs / Wheelchair Mobility  Ambulation/Gait Ambulation/Gait assistance: Mod assist Gait Distance (Feet): 20 Feet Assistive device: Rolling walker (2 wheels) Gait Pattern/deviations: Step-to pattern, Decreased stride length, Decreased weight shift to right, Shuffle, Ataxic, Scissoring, Staggering left, Drifts right/left, Trunk flexed, Narrow base of support General Gait Details: Heavy leftward lean at times, requiring up to mod assist to correct. Cues for awareness of [name] placement, to widen BOS, and correct position within RW to maximize support. [name] grip strength adequate to hold RW but needs assist at all times for balance and RW control.  Ataxic without overt buckling. Needs cued frequently to lead with [name] due to neglect of [name] placement if stepping forward with RLE first. Gait velocity: dec Gait velocity interpretation: <1.31 ft/sec, indicative of household ambulator Pre-gait activities: Engineer, water march    Posture / Balance Dynamic Sitting Balance Sitting balance -  Comments: L lateral lean at times requiring physical assist Balance Overall balance assessment: Needs assistance Sitting-balance support: No upper extremity supported, Feet supported Sitting balance-Leahy Scale: Poor Sitting balance - Comments: L lateral lean at times requiring physical assist Postural control: Left lateral lean Standing balance support: Bilateral upper extremity supported, During functional activity, Reliant on assistive device for balance Standing balance-Leahy Scale: Poor Standing balance comment: L lateral lean at times, not able to self correct without cueing and physical assist    Special considerations/life events  Oxygen  intermittently in hospital 2L, Diabetic management yes, and Special service needs new lung cancer dx   Previous Home Environment (from acute therapy documentation) Living Arrangements: Spouse/significant other  Lives With: Significant other Available Help at Discharge: Other (Comment) Type of Home: House Home Layout: Two level Alternate Level Stairs-Rails: Left, Right Alternate Level Stairs-Number of Steps: flight Home Access: Stairs to enter Entrance Stairs-Rails: None Entrance Stairs-Number of Steps: 1 Bathroom Shower/Tub: Health visitor: Handicapped height Bathroom Accessibility: Yes Additional Comments: lives with significant other, walk in shower downstairs on main level, has bathtub upstairs  Discharge Living Setting Plans for Discharge Living Setting: Patient's home, Lives with (comment) (partner) Type of Home at Discharge: House Discharge Home Layout: Two level, Able to live on main level with bedroom/bathroom Alternate Level Stairs-Rails: Can reach both Alternate Level Stairs-Number of Steps: flight Discharge Home Access: Stairs to enter Entrance Stairs-Rails: None Entrance Stairs-Number of Steps: 1 Discharge Bathroom Shower/Tub: Walk-in shower Discharge Bathroom Toilet: Handicapped height Discharge Bathroom  Accessibility: Yes How Accessible: Accessible via walker Does the patient have any problems obtaining your medications?: No  Social/Family/Support Systems Patient Roles: Partner Anticipated Caregiver: Stephane RAMAN (partner) Anticipated Caregiver's Contact Information: [phone] Ability/Limitations of Caregiver: can provide supervision and assist with iADLs (cooking, etc) Caregiver Availability: 24/7 Discharge Plan Discussed with Primary Caregiver: Yes Is Caregiver In Agreement with Plan?: Yes Does Caregiver/Family have Issues with Lodging/Transportation while Pt is in Rehab?: No  Goals Patient/Family Goal for Rehab: PT/OT supervision to mod I, SLP n/a Expected length of stay: 8-10 days Additional Information: Discharge plan: home with partner, Stephane, who can provide supervision.  Also has a neighbor that can check in if needed. Pt/Family Agrees to Admission and willing to participate: Yes Program Orientation Provided & Reviewed with Pt/Caregiver Including Roles  & Responsibilities:  Yes  Decrease burden of Care through IP rehab admission: n/a  Possible need for SNF placement upon discharge: Not anticipated.  Plan for short LOS and discharge home at supervision to mod I level.    Patient Condition: This patient's condition remains as documented in the consult dated 05/14/24, in which the Rehabilitation Physician determined and documented that the patient's condition is appropriate for intensive rehabilitative care in an inpatient rehabilitation facility. Will admit to inpatient rehab today.  Preadmission Screen Completed By:  Reche FORBES Lowers, PT, DPT  05/14/2024 3:35 PM ______________________________________________________________________   Discussed status with Dr. Emeline on 05/17/24  at 9:57 AM and received approval for admission today.  Admission Coordinator:  Caitlin E Warren, PT, time 9:57 AM Pattricia 05/17/24    Assessment/Plan: Diagnosis: 73 year old female with right MCA, ACA  embolic infarcts   Does the need for close, 24 hr/day medical supervision in concert with the patient's rehab needs make it unreasonable for this patient to be served in a less intensive setting? Yes Co-Morbidities requiring supervision/potential complications:  - Recently diagnosed stage Ia lung cancer -Diabetes -Hypertension -Tobacco use Due to bladder management, bowel management, safety, skin/wound care, disease management, medication administration, pain management, and patient education, does the patient require 24 hr/day rehab nursing? Yes Does the patient require coordinated care of a physician, rehab nurse, therapy disciplines of PT, OT to address physical and functional deficits in the context of the above medical diagnosis(es)? Yes Addressing deficits in the following areas: balance, endurance, locomotion, strength, transferring, bowel/bladder control, bathing, dressing, feeding, grooming, toileting, and psychosocial support Can the patient actively participate in an intensive therapy program of at least 3 hrs of therapy per day at least 5 days per week? Yes The potential for patient to make measurable gains while on inpatient rehab is excellent Anticipated functional outcomes upon discharge from inpatient rehab are modified independent and supervision  with PT, modified independent and supervision with OT, n/a with SLP. Estimated rehab length of stay to reach the above functional goals is: 8-13 days Anticipated discharge destination: Home Overall Rehab/Functional Prognosis: excellent   MD Signature:  Joesph JAYSON Emeline, DO 05/17/2024

## 2024-05-14 NOTE — Progress Notes (Signed)
 STROKE TEAM PROGRESS NOTE   SUBJECTIVE (INTERVAL HISTORY) No family is at the bedside. One female family member is on the phone. Pt lying in bed, stated that she is doing OK. Pending CIR placement. Again discussed about loop recorder before CIR, she is in agreement.    OBJECTIVE Temp:  [97.7 F (36.5 C)-98.4 F (36.9 C)] 98.2 F (36.8 C) (10/13 1619) Pulse Rate:  [71-88] 72 (10/13 1210) Cardiac Rhythm: Normal sinus rhythm (10/13 0800) Resp:  [16-20] 20 (10/13 1619) BP: (123-157)/(75-79) 157/78 (10/13 1210) SpO2:  [91 %-100 %] 98 % (10/13 1210)  Recent Labs  Lab 05/13/24 1631 05/13/24 2108 05/14/24 0641 05/14/24 1208 05/14/24 1618  GLUCAP 300* 269* 214* 192* 290*   Recent Labs  Lab 05/11/24 1437 05/11/24 1837 05/11/24 1848 05/12/24 0556 05/14/24 0418  NA 140 137 138 136 136  K 3.6 3.2* 3.1* 4.9 4.7  CL 102 99 98 102 98  CO2 23 23  --  25 27  GLUCOSE 252* 256* 256* 205* 230*  BUN 18 17 17 16 20   CREATININE 1.14* 1.15* 1.20* 0.87 0.74  CALCIUM  9.7 9.3  --  8.7* 9.2  MG  --   --   --   --  1.9  PHOS  --   --   --   --  3.9   Recent Labs  Lab 05/11/24 1837  AST 27  ALT 12  ALKPHOS 74  BILITOT 0.2  PROT 5.9*  ALBUMIN 3.6   Recent Labs  Lab 05/11/24 1300 05/11/24 1848 05/12/24 0556 05/14/24 0418  WBC 11.7*  --  21.6* 15.4*  NEUTROABS 7.1  --   --   --   HGB 14.6 12.9 11.8* 11.4*  HCT 45.6 38.0 35.7* 33.8*  MCV 97.6  --  96.2 94.9  PLT 273  --  238 242   No results for input(s): CKTOTAL, CKMB, CKMBINDEX, TROPONINI in the last 168 hours. Recent Labs    05/11/24 1837  LABPROT 14.4  INR 1.1   No results for input(s): COLORURINE, LABSPEC, PHURINE, GLUCOSEU, HGBUR, BILIRUBINUR, KETONESUR, PROTEINUR, UROBILINOGEN, NITRITE, LEUKOCYTESUR in the last 72 hours.  Invalid input(s): APPERANCEUR     Component Value Date/Time   CHOL 101 05/12/2024 0556   TRIG 166 (H) 05/12/2024 0556   HDL 45 05/12/2024 0556   CHOLHDL 2.3  05/12/2024 0556   VLDL 33 05/12/2024 0556   LDLCALC 23 05/12/2024 0556   Lab Results  Component Value Date   HGBA1C 7.4 (H) 05/11/2024      Component Value Date/Time   LABOPIA NEGATIVE 05/11/2024 2203   COCAINSCRNUR NEGATIVE 05/11/2024 2203   LABBENZ NEGATIVE 05/11/2024 2203   AMPHETMU NEGATIVE 05/11/2024 2203   THCU NEGATIVE 05/11/2024 2203   LABBARB NEGATIVE 05/11/2024 2203    Recent Labs  Lab 05/11/24 1837  ETH <15    I have personally reviewed the radiological images below and agree with the radiology interpretations.  ECHOCARDIOGRAM COMPLETE Result Date: 05/13/2024    ECHOCARDIOGRAM REPORT   Patient Name:   [name] Date of Exam: 05/13/2024 Medical Rec #:  [id number]       Height:       64.0 in Accession #:    [id number]      Weight:       177.0 lb Date of Birth:  May 31, 1951        BSA:          1.857 m Patient Age:    16 years  BP:           140/81 mmHg Patient Gender: F               HR:           77 bpm. Exam Location:  Inpatient Procedure: 2D Echo, Cardiac Doppler and Color Doppler (Both Spectral and Color            Flow Doppler were utilized during procedure). Indications:    Stroke I63.9  History:        Patient has prior history of Echocardiogram examinations, most                 recent 11/26/2020. COPD; Risk Factors:Hypertension and Diabetes.  Sonographer:    Jayson Gaskins Referring Phys: [id number] VASUNDHRA RATHORE IMPRESSIONS  1. Left ventricular ejection fraction, by estimation, is 60 to 65%. The left ventricle has normal function. The left ventricle has no regional wall motion abnormalities. There is mild concentric left ventricular hypertrophy. Left ventricular diastolic parameters are indeterminate.  2. Right ventricular systolic function is normal. The right ventricular size is normal. Tricuspid regurgitation signal is inadequate for assessing PA pressure.  3. The mitral valve is degenerative. Trivial mitral valve regurgitation.  4. The aortic valve is  tricuspid. Aortic valve regurgitation is not visualized. Aortic valve sclerosis/calcification is present, without any evidence of aortic stenosis. Aortic valve mean gradient measures 3.0 mmHg.  5. The inferior vena cava is dilated in size with <50% respiratory variability, suggesting right atrial pressure of 15 mmHg. Comparison(s): Prior images reviewed side by side. LVEF 60-65%. No obvious interatrial shunting. FINDINGS  Left Ventricle: Left ventricular ejection fraction, by estimation, is 60 to 65%. The left ventricle has normal function. The left ventricle has no regional wall motion abnormalities. The left ventricular internal cavity size was normal in size. There is  mild concentric left ventricular hypertrophy. Left ventricular diastolic parameters are indeterminate. Right Ventricle: The right ventricular size is normal. No increase in right ventricular wall thickness. Right ventricular systolic function is normal. Tricuspid regurgitation signal is inadequate for assessing PA pressure. Left Atrium: Left atrial size was normal in size. Right Atrium: Right atrial size was normal in size. Pericardium: There is no evidence of pericardial effusion. Presence of epicardial fat layer. Mitral Valve: The mitral valve is degenerative in appearance. Mild to moderate mitral annular calcification. Trivial mitral valve regurgitation. MV peak gradient, 8.0 mmHg. The mean mitral valve gradient is 4.0 mmHg. Tricuspid Valve: The tricuspid valve is grossly normal. Tricuspid valve regurgitation is trivial. Aortic Valve: The aortic valve is tricuspid. There is mild aortic valve annular calcification. Aortic valve regurgitation is not visualized. Aortic valve sclerosis/calcification is present, without any evidence of aortic stenosis. Aortic valve mean gradient measures 3.0 mmHg. Aortic valve peak gradient measures 5.0 mmHg. Aortic valve area, by VTI measures 2.05 cm. Pulmonic Valve: The pulmonic valve was not well visualized.  Pulmonic valve regurgitation is trivial. Aorta: The aortic root and ascending aorta are structurally normal, with no evidence of dilitation. Venous: The inferior vena cava is dilated in size with less than 50% respiratory variability, suggesting right atrial pressure of 15 mmHg. IAS/Shunts: No atrial level shunt detected by color flow Doppler. Additional Comments: 3D was performed not requiring image post processing on an independent workstation and was indeterminate.  LEFT VENTRICLE PLAX 2D LVIDd:         3.90 cm   Diastology LVIDs:         2.60 cm   LV e'  medial:    7.40 cm/s LV PW:         1.10 cm   LV E/e' medial:  16.1 LV IVS:        1.20 cm   LV e' lateral:   8.92 cm/s LVOT diam:     1.80 cm   LV E/e' lateral: 13.3 LV SV:         46 LV SV Index:   25 LVOT Area:     2.54 cm  RIGHT VENTRICLE RV S prime:     20.10 cm/s TAPSE (M-mode): 2.2 cm LEFT ATRIUM           Index        RIGHT ATRIUM          Index LA Vol (A2C): 34.4 ml 18.52 ml/m  RA Area:     9.68 cm LA Vol (A4C): 51.5 ml 27.73 ml/m  RA Volume:   17.50 ml 9.42 ml/m  AORTIC VALVE AV Area (Vmax):    2.10 cm AV Area (Vmean):   2.02 cm AV Area (VTI):     2.05 cm AV Vmax:           112.00 cm/s AV Vmean:          82.300 cm/s AV VTI:            0.225 m AV Peak Grad:      5.0 mmHg AV Mean Grad:      3.0 mmHg LVOT Vmax:         92.60 cm/s LVOT Vmean:        65.200 cm/s LVOT VTI:          0.181 m LVOT/AV VTI ratio: 0.80  AORTA Ao Root diam: 3.10 cm MITRAL VALVE MV Area (PHT): 3.50 cm     SHUNTS MV Area VTI:   1.19 cm     Systemic VTI:  0.18 m MV Peak grad:  8.0 mmHg     Systemic Diam: 1.80 cm MV Mean grad:  4.0 mmHg MV Vmax:       1.42 m/s MV Vmean:      92.9 cm/s MV Decel Time: 217 msec MV E velocity: 119.00 cm/s MV A velocity: 123.00 cm/s MV E/A ratio:  0.97 Jayson Sierras MD Electronically signed by Jayson Sierras MD Signature Date/Time: 05/13/2024/12:39:45 PM    Final    VAS US  LOWER EXTREMITY VENOUS (DVT) Result Date: 05/12/2024  Lower Venous  DVT Study Patient Name:  [name]  Date of Exam:   05/12/2024 Medical Rec #: [id number]        Accession #:    [id number] Date of Birth: 11-19-1950         Patient Gender: F Patient Age:   72 years Exam Location:  [facility] Procedure:      VAS US  LOWER EXTREMITY VENOUS (DVT) Referring Phys: ARY Kamree Wiens --------------------------------------------------------------------------------  Indications: Stroke in cancer patient.  Risk Factors: Newly diagnosed squamous cell cancer of the lung. Comparison Study: Prior negative left LEV done 04/15/20 at [facility] Performing Technologist: Alberta Lis RVS  Examination Guidelines: A complete evaluation includes B-mode imaging, spectral Doppler, color Doppler, and power Doppler as needed of all accessible portions of each vessel. Bilateral testing is considered an integral part of a complete examination. Limited examinations for reoccurring indications may be performed as noted. The reflux portion of the exam is performed with the patient in reverse Trendelenburg.  +---------+---------------+---------+-----------+----------+--------------+ RIGHT    CompressibilityPhasicitySpontaneityPropertiesThrombus Aging +---------+---------------+---------+-----------+----------+--------------+ CFV  Full           Yes      Yes                                 +---------+---------------+---------+-----------+----------+--------------+ SFJ      Full                                                        +---------+---------------+---------+-----------+----------+--------------+ FV Prox  Full           Yes      Yes                                 +---------+---------------+---------+-----------+----------+--------------+ FV Mid   Full           Yes      Yes                                 +---------+---------------+---------+-----------+----------+--------------+ FV DistalFull           Yes      Yes                                  +---------+---------------+---------+-----------+----------+--------------+ PFV      Full           Yes      Yes                                 +---------+---------------+---------+-----------+----------+--------------+ POP      Full                                                        +---------+---------------+---------+-----------+----------+--------------+ PTV      Full                                                        +---------+---------------+---------+-----------+----------+--------------+ PERO     Full                                                        +---------+---------------+---------+-----------+----------+--------------+   +---------+---------------+---------+-----------+----------+--------------+ LEFT     CompressibilityPhasicitySpontaneityPropertiesThrombus Aging +---------+---------------+---------+-----------+----------+--------------+ CFV      Full           Yes      Yes                                 +---------+---------------+---------+-----------+----------+--------------+ SFJ      Full                                                        +---------+---------------+---------+-----------+----------+--------------+  FV Prox  Full           Yes      Yes                                 +---------+---------------+---------+-----------+----------+--------------+ FV Mid   Full                                                        +---------+---------------+---------+-----------+----------+--------------+ FV DistalFull                                                        +---------+---------------+---------+-----------+----------+--------------+ PFV      Full           Yes      Yes                                 +---------+---------------+---------+-----------+----------+--------------+ POP      Full           Yes      Yes                                  +---------+---------------+---------+-----------+----------+--------------+ PTV      Full                                                        +---------+---------------+---------+-----------+----------+--------------+ PERO     Full                                                        +---------+---------------+---------+-----------+----------+--------------+     Summary: BILATERAL: - No evidence of deep vein thrombosis seen in the lower extremities, bilaterally. -No evidence of popliteal cyst, bilaterally.   *See table(s) above for measurements and observations. Electronically signed by Gaile New MD on 05/12/2024 at 10:12:34 PM.    Final    MR BRAIN WO CONTRAST Result Date: 05/12/2024 EXAM: MR Brain without Intravenous Contrast. CLINICAL HISTORY: 73 year old female with acute neuro deficit, stroke suspected, presenting yesterday. TECHNIQUE: Magnetic resonance images of the brain without intravenous contrast in multiple planes. CONTRAST: Without. COMPARISON: Head CT and CTA head and neck 05/11/2024, brain MRI 05/08/2024. FINDINGS: BRAIN: Widely scattered small and patchy areas of diffusion restriction in the right hemisphere correspond to the right MCA territory, and the right MCA/PCA watershed area. Confluent superior perirolandic involvement although postcentral involvement predominates (series 5 image 44). Minimal involvement of the right caudate, basal ganglia otherwise spared. No contralateral left hemisphere or posterior fossa diffusion restriction. T2 and FLAIR hyperintense cytotoxic edema in the affected areas and there is scattered petechial hemorrhage in the right parietal and occipital lobe  areas of involvement, new since 05/08/2024. No malignant hemorrhagic transformation. No intracranial mass effect. No midline shift or extra-axial fluid collection. Preexisting widespread chronic white matter T2 and FLAIR hyperintensity again noted. No cerebellar tonsillar ectopia. The central  arterial and venous flow voids are patent. VENTRICLES: No hydrocephalus. ORBITS: The orbits are normal. SINUSES AND MASTOIDS: The sinuses and mastoid air cells are clear. BONES: No acute fracture or focal osseous lesion. IMPRESSION: 1. Widely scattered small Acute infarcts in the right MCA and right MCA/PCA watershed territories. 2. Cytotoxic edema and scattered acute petechial hemorrhage (right parietal and occipital). No malignant hemorrhagic transformation. No intracranial mass effect. 3. Underlying chronic white matter disease. Electronically signed by: Helayne Hurst MD 05/12/2024 10:05 AM EDT RP Workstation: [id number]   CT ANGIO HEAD NECK W WO CM W PERF (CODE STROKE) Result Date: 05/11/2024 EXAM: CTA Head and Neck with Perfusion 05/11/2024 06:08:14 PM TECHNIQUE: CTA of the head and neck was performed without and with the administration of 100 mL of intravenous contrast (iohexol  (OMNIPAQUE ) 350 MG/ML injection 100 mL IOHEXOL  350 MG/ML SOLN). 3D postprocessing with multiplanar reconstructions and MIPs was performed to evaluate the vascular anatomy. Cerebral perfusion analysis using computed tomography with contrast administration, including post-processing of parametric maps with determination of cerebral blood flow, cerebral blood volume, mean transit time and time-to-maximum. Automated exposure control, iterative reconstruction, and/or weight based adjustment of the mA/kV was utilized to reduce the radiation dose to as low as reasonably achievable. COMPARISON: None available CLINICAL HISTORY: Neuro deficit, acute, stroke suspected. Left sided weakness. FINDINGS: CTA NECK: AORTIC ARCH AND ARCH VESSELS: Common origin of left common carotid artery and a dominant artery is noted. Atherosclerotic changes are present at the aorta and at the origin of the left subclavian artery without aneurysm or focal stenosis. No dissection or arterial injury. No significant stenosis of the brachiocephalic or subclavian  arteries. CERVICAL CAROTID ARTERIES: Atherosclerotic changes are present at the right carotid bifurcation and proximal right ICA without focal stenosis. Atherosclerotic changes are present at the left carotid bifurcation and proximal left ICA without focal stenosis. No dissection, arterial injury, or hemodynamically significant stenosis by NASCET criteria. CERVICAL VERTEBRAL ARTERIES: The left vertebral artery is a dominant vessel. Moderate tortuosity is present in the V1 segment of the left vertebral artery without focal stenosis. No dissection, arterial injury, or significant stenosis. LUNGS AND MEDIASTINUM: Unremarkable. SOFT TISSUES: No acute abnormality. BONES: Grade 1 anterolisthesis and moderate right facet degenerative change leads to moderate right foraminal stenosis at C3-C4. CTA HEAD: ANTERIOR CIRCULATION: No significant stenosis of the internal carotid arteries. No significant stenosis of the anterior cerebral arteries. No significant stenosis of the middle cerebral arteries. No aneurysm. POSTERIOR CIRCULATION: No significant stenosis of the posterior cerebral arteries. No significant stenosis of the basilar artery. No significant stenosis of the vertebral arteries. No aneurysm. OTHER: No dural venous sinus thrombosis on this non-dedicated study. CT PERFUSION: EXAM QUALITY: Exam quality is adequate with diagnostic perfusion maps. No significant motion artifact. Appropriate arterial inflow and venous outflow curves. CORE INFARCT (CBF<30% volume): 0 mL TOTAL HYPOPERFUSION (Tmax>6s volume): 0 mL PENUMBRA: Mismatch volume: 0 mL IMPRESSION: 1. No acute large vessel occlusion. 2. No hemodynamically significant stenosis or aneurysm in the head or neck vessels. 3. No evidence of ischemia by CT brain perfusion. Electronically signed by: Lonni Necessary MD 05/11/2024 06:23 PM EDT RP Workstation: [id number]   CT HEAD CODE STROKE WO CONTRAST Result Date: 05/11/2024 EXAM: CT HEAD WITHOUT CONTRAST 05/11/2024  06:08:14 PM TECHNIQUE: CT of  the head was performed without the administration of intravenous contrast. Automated exposure control, iterative reconstruction, and/or weight based adjustment of the mA/kV was utilized to reduce the radiation dose to as low as reasonably achievable. COMPARISON: None available. CLINICAL HISTORY: Neuro deficit, acute, stroke suspected. Left sided weakness. FINDINGS: BRAIN AND VENTRICLES: Intravascular contrast from CT angio chest of the same day reduces sensitivity for subarachnoid blood. No focal hemorrhage is present. Periventricular and scattered subcortical white matter hypoattenuation is moderately advanced for age. This most likely reflects the sequelae of chronic microvascular ischemia. No evidence of acute infarct. No hydrocephalus. No extra-axial collection. No mass effect or midline shift. ORBITS: No acute abnormality. SINUSES: No acute abnormality. SOFT TISSUES AND SKULL: No acute soft tissue abnormality. No skull fracture. IMPRESSION: 1. No acute intracranial abnormality. Electronically signed by: Lonni Necessary MD 05/11/2024 06:13 PM EDT RP Workstation: [id number]   CT Angio Chest PE W and/or Wo Contrast Result Date: 05/11/2024 CLINICAL DATA:  Pulmonary embolism (PE) suspected, low to intermediate prob, positive D-dimer. Difficulty breathing. Possible allergic reaction. EXAM: CT ANGIOGRAPHY CHEST WITH CONTRAST TECHNIQUE: Multidetector CT imaging of the chest was performed using the standard protocol during bolus administration of intravenous contrast. Multiplanar CT image reconstructions and MIPs were obtained to evaluate the vascular anatomy. RADIATION DOSE REDUCTION: This exam was performed according to the departmental dose-optimization program which includes automated exposure control, adjustment of the mA and/or kV according to patient size and/or use of iterative reconstruction technique. CONTRAST:  75mL OMNIPAQUE  IOHEXOL  350 MG/ML SOLN COMPARISON:  None  Available. FINDINGS: Cardiovascular: Scattered coronary artery and aortic atherosclerosis. Heart is normal size. Aorta is normal caliber. No filling defects in the pulmonary arteries to suggest pulmonary emboli. Mediastinum/Nodes: No mediastinal, hilar, or axillary adenopathy. Trachea and esophagus are unremarkable. 1.6 cm left thyroid  nodule, unchanged since prior study. This has been evaluated on previous imaging. (ref: J Am Coll Radiol. 2015 Feb;12(2): 143-50). Lungs/Pleura: Lingular nodule again noted measuring 1.4 cm in greatest diameter, unchanged. Biopsy fiducial adjacent to the nodule, unchanged since prior PET CT. No new nodules. Bilateral lower lobe airway thickening with areas of mucous plugging and bibasilar atelectasis. No effusions. Upper Abdomen: No acute findings Musculoskeletal: Chest wall soft tissues are unremarkable. No acute bony abnormality. Review of the MIP images confirms the above findings. IMPRESSION: No evidence of pulmonary embolus. Stable lingular nodule, 1.4 cm. Airway thickening with bibasilar atelectasis. Coronary artery disease. Aortic Atherosclerosis (ICD10-I70.0). Electronically Signed   By: Franky Crease M.D.   On: 05/11/2024 17:31   DG Chest Port 1 View Result Date: 05/11/2024 EXAM: 1 VIEW(S) XRAY OF THE CHEST 05/11/2024 01:50:00 PM COMPARISON: 04/23/2024 CLINICAL HISTORY: Allergic reaction [id number]. Per chart: Pt BIB EMS from Home due to sudden difficulty breathing after take her medication BC for migraines possible allergic reaction; initially SpO2 65% on room air. Hx of Lung Cancer and COPD, no O2 at home. Pt has an appointment with Cancer center ; at 2:30. FINDINGS: LUNGS AND PLEURA: Vague nodule with fiducial marker in left mid lung. No pulmonary edema. No pleural effusion. No pneumothorax. HEART AND MEDIASTINUM: No acute abnormality of the cardiac and mediastinal silhouettes. BONES AND SOFT TISSUES: No acute osseous abnormality. IMPRESSION: 1. No acute cardiopulmonary  process. 2. Left mid-lung pulmonary   fiducial marker. Electronically signed by: Dayne Hassell MD 05/11/2024 02:10 PM EDT RP Workstation: [id number]   NM PET Image Restage (PS) Skull Base to Thigh (F-18 FDG) Result Date: 05/10/2024 CLINICAL DATA:  Subsequent treatment strategy for restaging of non-small-cell  lung cancer. EXAM: NUCLEAR MEDICINE PET SKULL BASE TO THIGH TECHNIQUE: 8.9 mCi F-18 FDG was injected intravenously. Full-ring PET imaging was performed from the skull base to thigh after the radiotracer. CT data was obtained and used for attenuation correction and anatomic localization. Fasting blood glucose: 139 mg/dl COMPARISON:  91/79/7974 lung cancer screening CT. PET of 12/20/2023 also reviewed. FINDINGS: Mediastinal blood pool activity: SUV max 3.1 Liver activity: SUV max NA NECK: No areas of abnormal hypermetabolism. Incidental CT findings: Bilateral carotid atherosclerosis. A left-sided 1.6 cm thyroid  nodule has been evaluated on 06/03/2022 ultrasound. No cervical adenopathy. CHEST: No thoracic nodal hypermetabolism. The lingular nodule is hypermetabolic. 1.6 x 1.3 cm and a S.U.V. max of 4.6 cm 36/7. Adjacent biopsy fiducial. Incidental CT findings: Centrilobular emphysema. Aortic and coronary artery calcification. ABDOMEN/PELVIS: No abdominopelvic parenchymal or nodal hypermetabolism. Incidental CT findings: Normal adrenal glands. Hepatic morphology again suspicious for mild cirrhosis. Cholecystectomy. Fat containing ventral abdominal wall laxity. Hysterectomy. SKELETON: No abnormal marrow activity. Incidental CT findings: Lumbosacral spine fixation. IMPRESSION: 1. Hypermetabolic 1.6 cm lingular nodule, without thoracic nodal or distant metastasis. T1b, N0, M0. Stage 1A. 2. Incidental findings, including: Aortic atherosclerosis (ICD10-I70.0), coronary artery atherosclerosis and emphysema (ICD10-J43.9). Probable mild cirrhosis. Electronically Signed   By: Rockey Kilts M.D.   On: 05/10/2024 15:08    MR BRAIN W WO CONTRAST Result Date: 05/09/2024 CLINICAL DATA:  Lung cancer EXAM: MRI HEAD WITHOUT AND WITH CONTRAST TECHNIQUE: Multiplanar, multiecho pulse sequences of the brain and surrounding structures were obtained without and with intravenous contrast. CONTRAST:  8mL GADAVIST GADOBUTROL 1 MMOL/ML IV SOLN COMPARISON:  None Available. FINDINGS: MRI brain: There are multiple foci of T2 hyperintensity in the cerebral white matter. These do not have restricted diffusion. The signal in the brain parenchyma is normal. No abnormal enhancement. There is no acute or chronic infarct. The ventricles are normal. No mass lesion. There are normal flow signals in the carotid arteries and basilar artery. No significant bone marrow signal abnormality. No significant abnormality in the paranasal sinuses or soft tissues. IMPRESSION: No evidence of metastatic disease. There are multiple T2 hyperintensities within the cerebral white matter. These abnormalities are nonspecific and of uncertain etiology or clinical significance. They are more common in older patients and patients with risk factors for vascular disease. Electronically Signed   By: Nancyann Burns M.D.   On: 05/09/2024 09:22   DG Chest Port 1 View Result Date: 04/23/2024 CLINICAL DATA:  Status post bronchoscopy and biopsy. EXAM: PORTABLE CHEST 1 VIEW COMPARISON:  11/16/2016 and CT chest 03/21/2024. FINDINGS: Trachea is midline. Heart size normal. Fiducial marker in lingula, associated with a known nodule. Lungs are low in volume but otherwise clear. No pleural fluid. No pneumothorax. IMPRESSION: 1. No pneumothorax. 2. Placement of a fiducial marker at site of a known lingular nodule. Electronically Signed   By: Newell Eke M.D.   On: 04/23/2024 13:38   DG C-ARM BRONCHOSCOPY Result Date: 04/23/2024 C-ARM BRONCHOSCOPY: Fluoroscopy was utilized by the requesting physician.  No radiographic interpretation.     PHYSICAL EXAM  Temp:  [97.7 F (36.5 C)-98.4  F (36.9 C)] 98.2 F (36.8 C) (10/13 1619) Pulse Rate:  [71-88] 72 (10/13 1210) Resp:  [16-20] 20 (10/13 1619) BP: (123-157)/(75-79) 157/78 (10/13 1210) SpO2:  [91 %-100 %] 98 % (10/13 1210)  General - Well nourished, well developed, in no apparent distress.  Ophthalmologic - fundi not visualized due to noncooperation.  Cardiovascular - Regular rhythm and rate.  Neuro - awake, alert,  eyes open, orientated to age, place, time. No aphasia, fluent language, following all simple commands. Able to name and repeat. No gaze palsy, tracking bilaterally, visual field full. No facial droop. Tongue midline. [name] drift to bed within 10 sec, but hand griping 5/5. RUE and RLE 5/5, [name] proximal 5/5 and ankle DF/PF 4/5. Sensation decreased on the [name] but symmetrical BLEs, left FTN mild dysmetria but not out of proportion to the weakness, gait not tested.    ASSESSMENT/PLAN [name] is a 73 y.o. female with history of recent diagnosed lung cancer, smoker, HLD, CAD, HTN, DM, migraine admitted for SOB, HA, dizziness with low O2 sat. Found to have left sided weakness by EDP. No TNK given due to outside window.    Stroke:  right MCA, ACA and MCA/PCA infarcts embolic pattern secondary to embolic source CT no acute finding CTA head and neck unremarkable CTP neg MRI  right MCA, ACA and MCA/PCA infarcts 2D Echo EF 60-65% LE venous doppler no DVT Recommend loop recorder before discharge to rule out afib (pt mom and sister both had Afib and stroke), EP is aware LDL 23 HgbA1c 7.4 UDS neg Lovenox for VTE prophylaxis No antithrombotic prior to admission, now on clopidogrel 75 mg daily. ASA listed in allergy Patient counseled to be compliant with her antithrombotic medications Ongoing aggressive stroke risk factor management Therapy recommendations:  CIR Disposition:  pending  Lung cancer SOB Recently diagnosed with lung cancer stage IA MRI brain no mets Smoker with SOB/COPD, on solumedrol CTA  chest no PE No DVT to suggest hypercoagulable state at this time Follow up with oncology  Diabetes HgbA1c 7.4 goal < 7.0 Controlled CBG monitoring SSI DM education and close PCP follow up  Hypertension Stable Long term BP goal normotensive   Hyperlipidemia Home meds:  crestor  5  LDL 23, goal < 70 Now on crestor  5 Continue statin at discharge  Tobacco abuse Current smoker Smoking cessation counseling provided Pt is willing to quit  Other Stroke Risk Factors Advanced age Coronary artery disease Migraines Family hx of afib and stroke (mom and sister)  Other Active Problems Leukocytosis 11.7->21.6->15.4 - due to steroids treatment for COPD  Hospital day # 2  Neurology will sign off. Please call with questions. Pt will follow up with Dr. Gregg at [facility] in about 4 weeks. Thanks for the consult.   Ary Cummins, MD PhD Stroke Neurology 05/14/2024 4:45 PM    To contact Stroke Continuity provider, please refer to [email or web address]. After hours, contact General Neurology

## 2024-05-15 ENCOUNTER — Ambulatory Visit: Admitting: Thoracic Surgery (Cardiothoracic Vascular Surgery)

## 2024-05-15 DIAGNOSIS — J9601 Acute respiratory failure with hypoxia: Secondary | ICD-10-CM | POA: Diagnosis not present

## 2024-05-15 LAB — URINALYSIS, ROUTINE W REFLEX MICROSCOPIC
Bilirubin Urine: NEGATIVE
Glucose, UA: >=500 mg/dL — AB
Hgb urine dipstick: NEGATIVE
Ketones, ur: NEGATIVE mg/dL
Leukocytes,Ua: NEGATIVE
Nitrite: NEGATIVE
Protein, ur: NEGATIVE mg/dL
Specific Gravity, Urine: 1.015 (ref 1.005–1.030)
pH: 6 (ref 5.0–8.0)

## 2024-05-15 LAB — BASIC METABOLIC PANEL WITH GFR
Anion gap: 12 (ref 5–15)
BUN: 24 mg/dL — ABNORMAL HIGH (ref 8–23)
CO2: 26 mmol/L (ref 22–32)
Calcium: 9 mg/dL (ref 8.9–10.3)
Chloride: 98 mmol/L (ref 98–111)
Creatinine, Ser: 0.83 mg/dL (ref 0.44–1.00)
GFR, Estimated: >60 mL/min (ref >60)
Glucose, Bld: 255 mg/dL — ABNORMAL HIGH (ref 70–99)
Potassium: 4.4 mmol/L (ref 3.5–5.1)
Sodium: 136 mmol/L (ref 135–145)

## 2024-05-15 LAB — CBC
HCT: 34.8 % — ABNORMAL LOW (ref 36.0–46.0)
Hemoglobin: 11.8 g/dL — ABNORMAL LOW (ref 12.0–15.0)
MCH: 32.2 pg (ref 26.0–34.0)
MCHC: 33.9 g/dL (ref 30.0–36.0)
MCV: 94.8 fL (ref 80.0–100.0)
Platelets: 248 K/uL (ref 150–400)
RBC: 3.67 MIL/uL — ABNORMAL LOW (ref 3.87–5.11)
RDW: 12.3 % (ref 11.5–15.5)
WBC: 14 K/uL — ABNORMAL HIGH (ref 4.0–10.5)
nRBC: 0 % (ref 0.0–0.2)

## 2024-05-15 LAB — GLUCOSE, CAPILLARY
Glucose-Capillary: 231 mg/dL — ABNORMAL HIGH (ref 70–99)
Glucose-Capillary: 279 mg/dL — ABNORMAL HIGH (ref 70–99)
Glucose-Capillary: 338 mg/dL — ABNORMAL HIGH (ref 70–99)
Glucose-Capillary: 174 mg/dL — ABNORMAL HIGH (ref 70–99)

## 2024-05-15 LAB — MAGNESIUM: Magnesium: 2 mg/dL (ref 1.7–2.4)

## 2024-05-15 MED ORDER — PREDNISONE 20 MG PO TABS
40.0000 mg | ORAL_TABLET | Freq: Every day | ORAL | Status: DC
  Administered 2024-05-16 – 2024-05-17 (×2): 40 mg via ORAL
  Filled 2024-05-15 (×2): qty 2

## 2024-05-15 MED ORDER — INSULIN ASPART 100 UNIT/ML IJ SOLN
3.0000 [IU] | Freq: Three times a day (TID) | INTRAMUSCULAR | Status: DC
  Administered 2024-05-15 – 2024-05-17 (×9): 3 [IU] via SUBCUTANEOUS

## 2024-05-15 MED ORDER — INSULIN GLARGINE 100 UNIT/ML ~~LOC~~ SOLN
15.0000 [IU] | Freq: Every day | SUBCUTANEOUS | Status: DC
  Administered 2024-05-15 – 2024-05-16 (×2): 15 [IU] via SUBCUTANEOUS
  Filled 2024-05-15 (×4): qty 0.15

## 2024-05-15 NOTE — Progress Notes (Signed)
 Report called to receiving RN, Hulen Blanch. All questions answered and pt transferred uneventfully to new room, 3W21. Pt endorsed she would update her contacts on her new room location in AM.

## 2024-05-15 NOTE — Plan of Care (Signed)
  Problem: Education: Goal: Ability to describe self-care measures that may prevent or decrease complications (Diabetes Survival Skills Education) will improve 05/15/2024 1656 by Christie Carlin NOVAK, RN Outcome: Progressing 05/15/2024 1655 by Christie Carlin NOVAK, RN Outcome: Progressing   Problem: Fluid Volume: Goal: Ability to maintain a balanced intake and output will improve 05/15/2024 1656 by Christie Carlin NOVAK, RN Outcome: Progressing 05/15/2024 1655 by Christie Carlin NOVAK, RN Outcome: Progressing   Problem: Tissue Perfusion: Goal: Adequacy of tissue perfusion will improve 05/15/2024 1656 by Christie Carlin NOVAK, RN Outcome: Progressing 05/15/2024 1655 by Christie Carlin NOVAK, RN Outcome: Progressing   Problem: Ischemic Stroke/TIA Tissue Perfusion: Goal: Complications of ischemic stroke/TIA will be minimized 05/15/2024 1656 by Christie Carlin NOVAK, RN Outcome: Progressing 05/15/2024 1655 by Christie Carlin NOVAK, RN Outcome: Progressing

## 2024-05-15 NOTE — Progress Notes (Signed)
 PT Cancellation Note  Patient Details Name: [name] MRN: [id number] DOB: 11-20-1950   Cancelled Treatment:    Reason Eval/Treat Not Completed: (P) Medical issues which prohibited therapy (pt blood sugar is 338, NT checked just prior to session attempt. RN notified. Pt reports she had rice and pasta in her lunch meal.) Will continue efforts next date per PT plan of care as schedule permits if lab values are in more stable range. Per RN, plan to transition to PO steroids tomorrow so hopefully blood sugars will be less elevated.   Nadim Malia M Matricia Begnaud 05/15/2024, 5:09 PM

## 2024-05-15 NOTE — Plan of Care (Signed)

## 2024-05-15 NOTE — Progress Notes (Signed)
 PROGRESS NOTE    [name]  FMW:[id number] DOB: 1950-11-02 DOA: 05/11/2024 PCP: Christy Anthony RAMAN, FNP    Brief Narrative:   73 year old with history of SCC of the lung, COPD with ongoing tobacco use, HLD, depression, DM2, HTN presented with shortness of breath possible allergic reaction to BC powder.  Noted to have hypoxia and facial edema.  Patient was given Solu-Medrol , epinephrine  and Zofran  with EMS.  Also noted to have left-sided neglect with weakness.  Code stroke was activated and eventually workup was consistent with multifocal infarct, likely embolic source.  Routine workup was performed by neurology team and eventually determined patient will be on daily Plavix and statin.  She is allergic to aspirin .  Eventually started on steroids as well due to concerns of acute COPD exacerbation.  She continues to slowly improve.  We plan on transitioning patient to CIR and upon discharge she will get loop recorder.  EP team is aware.  Assessment & Plan:  Acute multifocal infarct Left-sided weakness - Patient was outside of TNK window.  CT head, CTA head and neck were unremarkable.  MRI was consistent with right-sided multifocal infarct.  LDL 23, A1c 7.4, UDS was negative. -Daily Plavix and statin.  Allergic to aspirin  - Echocardiogram preserved EF of 65% with mild LVH.  Tele showed bradycardia and a very brief pause. No AV nodal blockers.  Briefly discussed with EP, no further workup indicated at this time.  Plans for loop recorder at the time of discharge from CIR.  EP team is aware  Acute hypoxia Acute COPD exacerbation - There was a question of possible BC powder allergic reaction due to facial swelling and hypoxia therefore received Solu-Medrol , epinephrine , Pepcid , albuterol .  CTA chest was negative for PE.  Breathing is now significantly better therefore we will transition to p.o. prednisone for 3 more days.  Continue bronchodilators, I-S and flutter valve   Acute kidney injury,  resolved -Creatinine peaked 1.2, now resolved at baseline 0.8  Leukocytosis, stable - Suspect reactive and secondary to steroids.  UA still pending to be collected.  Holding off on antibiotics  Hypokalemia - As needed repletion  Diabetes mellitus type 2, hyperglycemia steroid-induced - A1c 7.4.  Holding home p.o. regimen - Sliding scale and Accu-Cheks, add long-acting.  Adjust as necessary  Essential hypertension - Allowing permissive hypertension  Squamous cell carcinoma of the lung Active tobacco use - Follows outpatient oncology team  Hyperlipidemia - Statin  Mood disorder - Amitriptyline  and appeared preserved  Chronic pain - Gabapentin   Tobacco use - Nicotine patch   DVT prophylaxis: Lovenox     Code Status: Full Code Family Communication:   Status is: Inpatient Remains inpatient appropriate because: Awaiting insurance authorization for CIR   PT Follow up Recs: Acute Inpatient Rehab (3hours/Day)05/13/2024 0900  Subjective: Seen at bedside no complaints.  Breathing is significantly better. Examination:  General exam: Appears calm and comfortable  Respiratory system: b/l rhonchi Cardiovascular system: S1 & S2 heard, RRR. No JVD, murmurs, rubs, gallops or clicks. No pedal edema. Gastrointestinal system: Abdomen is nondistended, soft and nontender. No organomegaly or masses felt. Normal bowel sounds heard. Central nervous system: Alert and oriented. No focal neurological deficits. Extremities: Symmetric 5 x 5 power. Skin: No rashes, lesions or ulcers Psychiatry: Judgement and insight appear normal. Mood & affect appropriate.                Diet Orders (From admission, onward)     Start     Ordered  05/11/24 2203  Diet heart healthy/carb modified Room service appropriate? Yes; Fluid consistency: Thin  Diet effective now       Question Answer Comment  Diet-HS Snack? Nothing   Room service appropriate? Yes   Fluid consistency: Thin       05/11/24 2202            Objective: Vitals:   05/15/24 0311 05/15/24 0749 05/15/24 0800 05/15/24 0900  BP: (!) 149/71 (!) 174/88    Pulse: 62 76    Resp: 15 20    Temp: 97.6 F (36.4 C) 98.5 F (36.9 C)    TempSrc: Oral Oral    SpO2: 96% 100% 97% 98%    Intake/Output Summary (Last 24 hours) at 05/15/2024 1211 Last data filed at 05/15/2024 0617 Gross per 24 hour  Intake 360 ml  Output 2250 ml  Net -1890 ml   There were no vitals filed for this visit.  Scheduled Meds:  amitriptyline   100 mg Oral QHS   ARIPiprazole   20 mg Oral Daily   budesonide (PULMICORT) nebulizer solution  0.25 mg Nebulization BID   clopidogrel  75 mg Oral Daily   enoxaparin (LOVENOX) injection  40 mg Subcutaneous Q24H   famotidine   20 mg Oral BID   gabapentin   100 mg Oral BID   insulin  aspart  0-5 Units Subcutaneous QHS   insulin  aspart  0-9 Units Subcutaneous TID WC   insulin  aspart  3 Units Subcutaneous TID WC   insulin  glargine  15 Units Subcutaneous Daily   ipratropium-albuterol   3 mL Nebulization BID   nicotine  14 mg Transdermal Daily   [START ON 05/16/2024] predniSONE  40 mg Oral Q breakfast   rosuvastatin   5 mg Oral Daily   Continuous Infusions:  Nutritional status     There is no height or weight on file to calculate BMI.  Data Reviewed:   CBC: Recent Labs  Lab 05/11/24 1300 05/11/24 1848 05/12/24 0556 05/14/24 0418 05/15/24 0314  WBC 11.7*  --  21.6* 15.4* 14.0*  NEUTROABS 7.1  --   --   --   --   HGB 14.6 12.9 11.8* 11.4* 11.8*  HCT 45.6 38.0 35.7* 33.8* 34.8*  MCV 97.6  --  96.2 94.9 94.8  PLT 273  --  238 242 248   Basic Metabolic Panel: Recent Labs  Lab 05/11/24 1437 05/11/24 1837 05/11/24 1848 05/12/24 0556 05/14/24 0418 05/15/24 0314  NA 140 137 138 136 136 136  K 3.6 3.2* 3.1* 4.9 4.7 4.4  CL 102 99 98 102 98 98  CO2 23 23  --  25 27 26   GLUCOSE 252* 256* 256* 205* 230* 255*  BUN 18 17 17 16 20  24*  CREATININE 1.14* 1.15* 1.20* 0.87 0.74 0.83   CALCIUM  9.7 9.3  --  8.7* 9.2 9.0  MG  --   --   --   --  1.9 2.0  PHOS  --   --   --   --  3.9  --    GFR: CrCl cannot be calculated (Unknown ideal weight.). Liver Function Tests: Recent Labs  Lab 05/11/24 1837  AST 27  ALT 12  ALKPHOS 74  BILITOT 0.2  PROT 5.9*  ALBUMIN 3.6   No results for input(s): LIPASE, AMYLASE in the last 168 hours. No results for input(s): AMMONIA in the last 168 hours. Coagulation Profile: Recent Labs  Lab 05/11/24 1837  INR 1.1   Cardiac Enzymes: No results for input(s): CKTOTAL, CKMB,  CKMBINDEX, TROPONINI in the last 168 hours. BNP (last 3 results) No results for input(s): PROBNP in the last 8760 hours. HbA1C: No results for input(s): HGBA1C in the last 72 hours. CBG: Recent Labs  Lab 05/14/24 1208 05/14/24 1618 05/14/24 2113 05/15/24 0619 05/15/24 1205  GLUCAP 192* 290* 251* 231* 279*   Lipid Profile: No results for input(s): CHOL, HDL, LDLCALC, TRIG, CHOLHDL, LDLDIRECT in the last 72 hours. Thyroid  Function Tests: No results for input(s): TSH, T4TOTAL, FREET4, T3FREE, THYROIDAB in the last 72 hours. Anemia Panel: No results for input(s): VITAMINB12, FOLATE, FERRITIN, TIBC, IRON, RETICCTPCT in the last 72 hours. Sepsis Labs: No results for input(s): PROCALCITON, LATICACIDVEN in the last 168 hours.  No results found for this or any previous visit (from the past 240 hours).       Radiology Studies: No results found.         LOS: 3 days   Time spent= 35 mins    Burgess JAYSON Dare, MD [facility]  If 7PM-7AM, please contact night-coverage  05/15/2024, 12:11 PM

## 2024-05-15 NOTE — Progress Notes (Signed)
 Inpatient Rehab Admissions Coordinator:   Awaiting determination from [facility] regarding CIR prior auth request.  Will follow.   Reche Lowers, PT, DPT Admissions Coordinator [phone] 05/15/24  12:31 PM

## 2024-05-16 DIAGNOSIS — E876 Hypokalemia: Secondary | ICD-10-CM | POA: Diagnosis not present

## 2024-05-16 DIAGNOSIS — N179 Acute kidney failure, unspecified: Secondary | ICD-10-CM | POA: Diagnosis not present

## 2024-05-16 DIAGNOSIS — R531 Weakness: Secondary | ICD-10-CM | POA: Diagnosis not present

## 2024-05-16 DIAGNOSIS — E1169 Type 2 diabetes mellitus with other specified complication: Secondary | ICD-10-CM

## 2024-05-16 DIAGNOSIS — J9601 Acute respiratory failure with hypoxia: Secondary | ICD-10-CM | POA: Diagnosis not present

## 2024-05-16 LAB — CBC
HCT: 36.5 % (ref 36.0–46.0)
Hemoglobin: 12.3 g/dL (ref 12.0–15.0)
MCH: 31.8 pg (ref 26.0–34.0)
MCHC: 33.7 g/dL (ref 30.0–36.0)
MCV: 94.3 fL (ref 80.0–100.0)
Platelets: 277 K/uL (ref 150–400)
RBC: 3.87 MIL/uL (ref 3.87–5.11)
RDW: 12.2 % (ref 11.5–15.5)
WBC: 13.3 K/uL — ABNORMAL HIGH (ref 4.0–10.5)
nRBC: 0.2 % (ref 0.0–0.2)

## 2024-05-16 LAB — BASIC METABOLIC PANEL WITH GFR
Anion gap: 10 (ref 5–15)
BUN: 31 mg/dL — ABNORMAL HIGH (ref 8–23)
CO2: 26 mmol/L (ref 22–32)
Calcium: 8.9 mg/dL (ref 8.9–10.3)
Chloride: 101 mmol/L (ref 98–111)
Creatinine, Ser: 0.96 mg/dL (ref 0.44–1.00)
GFR, Estimated: >60 mL/min (ref >60)
Glucose, Bld: 142 mg/dL — ABNORMAL HIGH (ref 70–99)
Potassium: 3.8 mmol/L (ref 3.5–5.1)
Sodium: 137 mmol/L (ref 135–145)

## 2024-05-16 LAB — GLUCOSE, CAPILLARY
Glucose-Capillary: 134 mg/dL — ABNORMAL HIGH (ref 70–99)
Glucose-Capillary: 168 mg/dL — ABNORMAL HIGH (ref 70–99)
Glucose-Capillary: 331 mg/dL — ABNORMAL HIGH (ref 70–99)
Glucose-Capillary: 128 mg/dL — ABNORMAL HIGH (ref 70–99)

## 2024-05-16 LAB — MAGNESIUM: Magnesium: 1.8 mg/dL (ref 1.7–2.4)

## 2024-05-16 MED ORDER — INSULIN GLARGINE-YFGN 100 UNIT/ML ~~LOC~~ SOLN
15.0000 [IU] | Freq: Every day | SUBCUTANEOUS | Status: DC
  Administered 2024-05-17: 15 [IU] via SUBCUTANEOUS
  Filled 2024-05-16: qty 0.15

## 2024-05-16 NOTE — Progress Notes (Signed)
 Occupational Therapy Treatment Patient Details Name: [name] MRN: [id number] DOB: Jan 25, 1951 Today's Date: 05/16/2024   History of present illness 73 year old presented 10/10 with shortness of breath possible allergic reaction to [facility] powder. Noted to have hypoxia and facial edema. Also noted to have left-sided neglect with weakness. Code stroke was activated and eventually workup was consistent with multifocal infarct, likely embolic source. PMH: SCC of the lung, COPD with ongoing tobacco use, HLD, depression, DM2, HTN   OT comments  Patient demonstrating good gains with OT treatment for bed mobility, self care, and transfers. Patient with wet gown due to dropping her water  earlier when using [name] to hold cup. Gown change performed seated on EOB with min assist. Patient demonstrated [facility] for [name] ROM and able to hold items but often requires attention.  Patient will benefit from intensive inpatient follow-up therapy, >3 hours/day.  Acute OT to continue to follow to address established goals to facilitate DC to next venue of care.        If plan is discharge home, recommend the following:  A lot of help with walking and/or transfers;A little help with bathing/dressing/bathroom;Assistance with cooking/housework;Assist for transportation;Help with stairs or ramp for entrance   Equipment Recommendations  Other (comment) (defer)    Recommendations for Other Services      Precautions / Restrictions Precautions Precautions: Fall Recall of Precautions/Restrictions: Intact Restrictions Weight Bearing Restrictions Per Provider Order: No       Mobility Bed Mobility Overal bed mobility: Needs Assistance Bed Mobility: Supine to Sit     Supine to sit: Min assist     General bed mobility comments: assistance with scooting towards EOB    Transfers Overall transfer level: Needs assistance Equipment used: Rolling walker (2 wheels) Transfers: Sit to/from Stand, Bed to  chair/wheelchair/BSC Sit to Stand: Min assist     Step pivot transfers: Mod assist     General transfer comment: patient able to stand from EOB with min assist and during transfers patient had difficulty progress [name] and required assistance for balance     Balance Overall balance assessment: Needs assistance Sitting-balance support: No upper extremity supported, Feet supported Sitting balance-Leahy Scale: Poor Sitting balance - Comments: CGA with left lateral leaning Postural control: Left lateral lean Standing balance support: Bilateral upper extremity supported, During functional activity, Reliant on assistive device for balance Standing balance-Leahy Scale: Poor Standing balance comment: reliant on BUE support when standing                           ADL either performed or assessed with clinical judgement   ADL Overall ADL's : Needs assistance/impaired     Grooming: Set up;Sitting           Upper Body Dressing : Minimal assistance;Sitting Upper Body Dressing Details (indicate cue type and reason): gown change Lower Body Dressing: Maximal assistance;Sitting/lateral leans;Sit to/from stand Lower Body Dressing Details (indicate cue type and reason): donning shoes Toilet Transfer: Moderate assistance;Rolling walker (2 wheels) Toilet Transfer Details (indicate cue type and reason): simulated to recliner           General ADL Comments: patient often dropping items when using [name]    Extremity/Trunk Assessment              Vision       Perception     Praxis     Communication Communication Communication: No apparent difficulties   Cognition Arousal: Alert Behavior During Therapy: [facility] for  tasks assessed/performed Cognition: No apparent impairments                               Following commands: Intact        Cueing   Cueing Techniques: Verbal cues  Exercises Exercises: General Upper Extremity General Exercises - Upper  Extremity Shoulder Flexion: AROM, Both, 10 reps, Seated Elbow Flexion: AROM, Both, 10 reps, Seated Wrist Flexion: AROM, Both, 10 reps    Shoulder Instructions       General Comments      Pertinent Vitals/ Pain       Pain Assessment Pain Assessment: No/denies pain  Home Living Family/patient expects to be discharged to:: Private residence Living Arrangements: Spouse/significant other                                      Prior Functioning/Environment              Frequency  Min 2X/week        Progress Toward Goals  OT Goals(current goals can now be found in the care plan section)  Progress towards OT goals: Progressing toward goals  Acute Rehab OT Goals Patient Stated Goal: to go to rehab OT Goal Formulation: With patient Time For Goal Achievement: 05/27/24 Potential to Achieve Goals: Good ADL Goals Pt Will Perform Upper Body Dressing: with supervision Pt Will Perform Lower Body Dressing: with supervision Pt Will Transfer to Toilet: with supervision Pt Will Perform Toileting - Clothing Manipulation and hygiene: with supervision  Plan      Co-evaluation                 AM-PAC OT 6 Clicks Daily Activity     Outcome Measure   Help from another person eating meals?: A Little Help from another person taking care of personal grooming?: A Little Help from another person toileting, which includes using toliet, bedpan, or urinal?: A Lot Help from another person bathing (including washing, rinsing, drying)?: A Lot Help from another person to put on and taking off regular upper body clothing?: A Little Help from another person to put on and taking off regular lower body clothing?: A Lot 6 Click Score: 15    End of Session Equipment Utilized During Treatment: Gait belt;Rolling walker (2 wheels)  OT Visit Diagnosis: Unsteadiness on feet (R26.81);Other abnormalities of gait and mobility (R26.89);Muscle weakness (generalized)  (M62.81);Hemiplegia and hemiparesis Hemiplegia - Right/Left: Left   Activity Tolerance Patient tolerated treatment well   Patient Left in chair;with call bell/phone within reach;with chair alarm set   Nurse Communication Mobility status        Time: 9084-9057 OT Time Calculation (min): 27 min  Charges: OT General Charges $OT Visit: 1 Visit OT Treatments $Self Care/Home Management : 8-22 mins $Therapeutic Exercise: 8-22 mins  Dick Lamb, OTA Acute Rehabilitation Services  Office [phone]   [name] 05/16/2024, 11:56 AM

## 2024-05-16 NOTE — Progress Notes (Signed)
 Physical Therapy Treatment Patient Details Name: [name] MRN: [id number] DOB: Sep 18, 1950 Today's Date: 05/16/2024   History of Present Illness 73 year old presented 10/10 with shortness of breath possible allergic reaction to [facility] powder. Noted to have hypoxia and facial edema. Also noted to have left-sided neglect with weakness. Code stroke was activated and eventually workup was consistent with multifocal infarct, likely embolic source. PMH: SCC of the lung, COPD with ongoing tobacco use, HLD, depression, DM2, HTN    PT Comments  Pt in recliner upon arrival and agreeable to PT session. Worked on gait training and dynamic balance in today's session. Pt requires ModA for losses of balance due to left lateral trunk leans and decreased proprioception of L LE. Worked on backwards stepping and lateral steps with increased difficulty stepping with L LE. Continue to recommend >3hrs post acute rehab to maximize rehab potential. Acute PT to follow.    If plan is discharge home, recommend the following: A lot of help with walking and/or transfers;A little help with bathing/dressing/bathroom;Assistance with cooking/housework;Assist for transportation;Help with stairs or ramp for entrance     Equipment Recommendations  None recommended by PT       Precautions / Restrictions Precautions Precautions: Fall Recall of Precautions/Restrictions: Intact Restrictions Weight Bearing Restrictions Per Provider Order: No     Mobility  Bed Mobility  General bed mobility comments: in recliner    Transfers Overall transfer level: Needs assistance Equipment used: Rolling walker (2 wheels) Transfers: Sit to/from Stand Sit to Stand: Min assist    General transfer comment: MinA for slight boost-up and steadying, cues for hand placement with RW    Ambulation/Gait Ambulation/Gait assistance: Mod assist Gait Distance (Feet): 20 Feet (x20, x10, x10) Assistive device: Rolling walker (2 wheels) Gait  Pattern/deviations: Step-to pattern, Decreased stride length, Decreased weight shift to right, Shuffle, Ataxic, Scissoring, Staggering left, Drifts right/left, Trunk flexed, Narrow base of support Gait velocity: dec     General Gait Details: occasional left lateral and posterior lean requiring ModA to correct. Ataxic gait pattern with decreased [name] proprioception. Cues to widen BOS, for RW proximity, and to lead with L LE.    Modified Rankin (Stroke Patients Only) Modified Rankin (Stroke Patients Only) Pre-Morbid Rankin Score: No symptoms Modified Rankin: Moderately severe disability     Balance Overall balance assessment: Needs assistance Sitting-balance support: No upper extremity supported, Feet supported Sitting balance-Leahy Scale: Poor Sitting balance - Comments: L lateral lean at times requiring physical assist Postural control: Left lateral lean, Posterior lean Standing balance support: Bilateral upper extremity supported, During functional activity, Reliant on assistive device for balance Standing balance-Leahy Scale: Poor Standing balance comment: L lateral lean at times, not able to self correct without cueing and physical assist      High level balance activites: Side stepping, Backward walking High Level Balance Comments: 3x8 ft backwards walking, B side-stepping 3x33ft ModA            Communication Communication Communication: No apparent difficulties  Cognition Arousal: Alert Behavior During Therapy: WFL for tasks assessed/performed   PT - Cognitive impairments: No apparent impairments    Following commands: Intact      Cueing Cueing Techniques: Verbal cues  Exercises General Exercises - Lower Extremity Long Arc Quad: AROM, Both, 10 reps, Seated (x5 reps) Other Exercises Other Exercises: x5 sit<>stand reps with R LE anterior to promote L LE WB        Pertinent Vitals/Pain Pain Assessment Pain Assessment: No/denies pain     PT Goals (  current goals  can now be found in the care plan section) Acute Rehab PT Goals PT Goal Formulation: With patient Time For Goal Achievement: 05/27/24 Potential to Achieve Goals: Good Progress towards PT goals: Progressing toward goals    Frequency    Min 3X/week       AM-PAC PT 6 Clicks Mobility   Outcome Measure  Help needed turning from your back to your side while in a flat bed without using bedrails?: A Little Help needed moving from lying on your back to sitting on the side of a flat bed without using bedrails?: A Little Help needed moving to and from a bed to a chair (including a wheelchair)?: A Little Help needed standing up from a chair using your arms (e.g., wheelchair or bedside chair)?: A Little Help needed to walk in hospital room?: A Lot Help needed climbing 3-5 steps with a railing? : Total 6 Click Score: 15    End of Session Equipment Utilized During Treatment: Gait belt Activity Tolerance: Patient tolerated treatment well Patient left: in chair;with call bell/phone within reach;with chair alarm set Nurse Communication: Mobility status PT Visit Diagnosis: Unsteadiness on feet (R26.81);Other abnormalities of gait and mobility (R26.89);History of falling (Z91.81);Ataxic gait (R26.0);Hemiplegia and hemiparesis Hemiplegia - Right/Left: Left Hemiplegia - dominant/non-dominant: Non-dominant     Time: 8954-8891 PT Time Calculation (min) (ACUTE ONLY): 23 min  Charges:    $Gait Training: 8-22 mins $Neuromuscular Re-education: 8-22 mins PT General Charges $$ ACUTE PT VISIT: 1 Visit                    Kate ORN, PT, DPT Secure Chat Preferred  Rehab Office [phone]  Kate BRAVO Wendolyn 05/16/2024, 11:17 AM

## 2024-05-16 NOTE — Plan of Care (Signed)
  Problem: Education: Goal: Ability to describe self-care measures that may prevent or decrease complications (Diabetes Survival Skills Education) will improve Outcome: Progressing   Problem: Coping: Goal: Ability to adjust to condition or change in health will improve Outcome: Progressing   Problem: Fluid Volume: Goal: Ability to maintain a balanced intake and output will improve Outcome: Progressing   Problem: Health Behavior/Discharge Planning: Goal: Ability to manage health-related needs will improve Outcome: Progressing   Problem: Metabolic: Goal: Ability to maintain appropriate glucose levels will improve Outcome: Progressing   

## 2024-05-16 NOTE — Progress Notes (Signed)
 Inpatient Rehab Admissions Coordinator:   I have insurance approval for CIR admit.  I do not have a bed available for her to admit today.  Will follow.    Reche Lowers, PT, DPT Admissions Coordinator [phone] 05/16/24  10:59 AM

## 2024-05-16 NOTE — Progress Notes (Signed)
 [facility]                                                                               [name], is a 73 y.o. female, DOB - 08/02/1951, FMW:[id number] Admit date - 05/11/2024    Outpatient Primary MD for the patient is [name], FNP  LOS - 4  days    Brief summary   73 year old with history of SCC of the lung, COPD with ongoing tobacco use, HLD, depression, DM2, HTN presented with shortness of breath possible allergic reaction to [facility] powder.  Noted to have hypoxia and facial edema.  Patient was given Solu-Medrol , epinephrine  and Zofran  with EMS.  Also noted to have left-sided neglect with weakness.  Code stroke was activated and eventually workup was consistent with multifocal infarct, likely embolic source.  Routine workup was performed by neurology team and eventually determined patient will be on daily Plavix and statin.  She is allergic to aspirin .  Eventually started on steroids as well due to concerns of acute COPD exacerbation.  She continues to slowly improve.  We plan on transitioning patient to CIR and upon discharge she will get loop recorder.  EP team is aware.    Assessment & Plan    Assessment and Plan:   Acute multifocal infarct Left-sided weakness - Patient was outside of TNK window.  CT head, CTA head and neck were unremarkable.  MRI was consistent with right-sided multifocal infarct.  LDL 23, A1c 7.4, UDS was negative. -Daily Plavix and statin.  Allergic to aspirin  - Echocardiogram preserved EF of 65% with mild LVH.   Tele showed bradycardia and a very brief pause. No AV nodal blockers.  Briefly discussed with EP, no further workup indicated at this time.  Plans for loop recorder at the time of discharge from CIR.  EP team is aware. Currently waiting for CIR.    Acute hypoxia Acute COPD exacerbation - There was a question of possible BC powder allergic reaction due to facial swelling and hypoxia therefore received Solu-Medrol ,  epinephrine , Pepcid , albuterol .  CTA chest was negative for PE.  Transitioned to prednisone to complete the course.      Acute kidney injury, resolved -Creatinine peaked 1.2, now resolved at baseline 0.8   Leukocytosis, stable - Suspect reactive and secondary to steroids.  UA still pending to be collected.  Holding off on antibiotics   Hypokalemia Replaced.    Diabetes mellitus type 2, hyperglycemia steroid-induced - A1c 7.4.   CBG (last 3)  Recent Labs    05/16/24 0611 05/16/24 1208 05/16/24 1522  GLUCAP 134* 168* 331*   Resume SSI.    Essential hypertension - well controlled.    Squamous cell carcinoma of the lung Active tobacco use - Follows outpatient oncology team   Hyperlipidemia - Statin   Mood disorder - Amitriptyline  and appeared preserved   Chronic pain - Gabapentin    Tobacco use - Nicotine patch         Estimated body mass index is 30.38 kg/m as calculated from the following:   Height as of this encounter: 5' 4 (1.626 m).   Weight as of 05/01/24: 80.3 kg.  Code Status:  full code.  DVT Prophylaxis:  enoxaparin (LOVENOX) injection 40 mg Start: 05/11/24 2200   Level of Care: Level of care: Telemetry Medical Family Communication: none at bedside.  Disposition Plan:     Remains inpatient appropriate:  pending CIR.   Procedures:  echo  Consultants:   None.   Antimicrobials:   Anti-infectives (From admission, onward)    None        Medications  Scheduled Meds:  amitriptyline   100 mg Oral QHS   ARIPiprazole   20 mg Oral Daily   budesonide (PULMICORT) nebulizer solution  0.25 mg Nebulization BID   clopidogrel  75 mg Oral Daily   enoxaparin (LOVENOX) injection  40 mg Subcutaneous Q24H   famotidine   20 mg Oral BID   gabapentin   100 mg Oral BID   insulin  aspart  0-5 Units Subcutaneous QHS   insulin  aspart  0-9 Units Subcutaneous TID WC   insulin  aspart  3 Units Subcutaneous TID WC   insulin  glargine  15 Units Subcutaneous Daily    ipratropium-albuterol   3 mL Nebulization BID   nicotine  14 mg Transdermal Daily   predniSONE  40 mg Oral Q breakfast   rosuvastatin   5 mg Oral Daily   Continuous Infusions: PRN Meds:.acetaminophen , glucagon (human recombinant), ipratropium-albuterol , labetalol, ondansetron  (ZOFRAN ) IV    Subjective:   [name] was seen and examined today.  No new complaints.   Objective:   Vitals:   05/16/24 0800 05/16/24 0832 05/16/24 1140 05/16/24 1520  BP:   138/79 (!) 147/71  Pulse:  74 75 82  Resp:  18 19 19   Temp:   97.7 F (36.5 C) 97.6 F (36.4 C)  TempSrc:      SpO2:  93% 92% 92%  Height: 5' 4 (1.626 m)       Intake/Output Summary (Last 24 hours) at 05/16/2024 1917 Last data filed at 05/16/2024 1324 Gross per 24 hour  Intake 240 ml  Output 1700 ml  Net -1460 ml   There were no vitals filed for this visit.   Exam General exam: Appears calm and comfortable  Respiratory system: Clear to auscultation. Respiratory effort normal. Cardiovascular system: S1 & S2 heard, RRR. No JVD, Gastrointestinal system: Abdomen is nondistended, soft and nontender.  Central nervous system: Alert and oriented. No focal neurological deficits. Extremities: Symmetric 5 x 5 power. Skin: No rashes,  Psychiatry: Mood & affect appropriate.    Data Reviewed:  I have personally reviewed following labs and imaging studies   CBC Lab Results  Component Value Date   WBC 13.3 (H) 05/16/2024   RBC 3.87 05/16/2024   HGB 12.3 05/16/2024   HCT 36.5 05/16/2024   MCV 94.3 05/16/2024   MCH 31.8 05/16/2024   PLT 277 05/16/2024   MCHC 33.7 05/16/2024   RDW 12.2 05/16/2024   LYMPHSABS 4.3 (H) 05/11/2024   MONOABS 0.2 05/11/2024   EOSABS 0.0 05/11/2024   BASOSABS 0.0 05/11/2024     Last metabolic panel Lab Results  Component Value Date   NA 137 05/16/2024   K 3.8 05/16/2024   CL 101 05/16/2024   CO2 26 05/16/2024   BUN 31 (H) 05/16/2024   CREATININE 0.96 05/16/2024   GLUCOSE 142 (H)  05/16/2024   GFRNONAA >60 05/16/2024   GFRAA >60 04/14/2020   CALCIUM  8.9 05/16/2024   PHOS 3.9 05/14/2024   PROT 5.9 (L) 05/11/2024   ALBUMIN 3.6 05/11/2024   BILITOT 0.2 05/11/2024   ALKPHOS 74 05/11/2024   AST 27 05/11/2024  ALT 12 05/11/2024   ANIONGAP 10 05/16/2024    CBG (last 3)  Recent Labs    05/16/24 0611 05/16/24 1208 05/16/24 1522  GLUCAP 134* 168* 331*      Coagulation Profile: Recent Labs  Lab 05/11/24 1837  INR 1.1     Radiology Studies: No results found.     Elgie Butter M.D. [facility] 05/16/2024, 7:17 PM  Available via Epic secure chat 7am-7pm After 7 pm, please refer to night coverage provider listed on amion.

## 2024-05-17 ENCOUNTER — Encounter (HOSPITAL_COMMUNITY)
Admission: EM | Disposition: A | Discharge status: Rehabilitation Facility | Payer: Self-pay | Source: Home / Self Care | Attending: Internal Medicine

## 2024-05-17 ENCOUNTER — Encounter (HOSPITAL_COMMUNITY): Payer: Self-pay | Admitting: Physical Medicine & Rehabilitation

## 2024-05-17 ENCOUNTER — Inpatient Hospital Stay (HOSPITAL_COMMUNITY)
Admission: AD | Admit: 2024-05-17 | Discharge: 2024-06-01 | DRG: 057 | Disposition: A | Discharge status: Home Health | Source: Intra-hospital | Attending: Physical Medicine & Rehabilitation | Admitting: Physical Medicine & Rehabilitation

## 2024-05-17 ENCOUNTER — Other Ambulatory Visit: Payer: Self-pay

## 2024-05-17 DIAGNOSIS — I69354 Hemiplegia and hemiparesis following cerebral infarction affecting left non-dominant side: Principal | ICD-10-CM

## 2024-05-17 DIAGNOSIS — Z885 Allergy status to narcotic agent status: Secondary | ICD-10-CM

## 2024-05-17 DIAGNOSIS — I69393 Ataxia following cerebral infarction: Secondary | ICD-10-CM

## 2024-05-17 DIAGNOSIS — Z9049 Acquired absence of other specified parts of digestive tract: Secondary | ICD-10-CM

## 2024-05-17 DIAGNOSIS — Z85038 Personal history of other malignant neoplasm of large intestine: Secondary | ICD-10-CM

## 2024-05-17 DIAGNOSIS — I639 Cerebral infarction, unspecified: Principal | ICD-10-CM | POA: Diagnosis present

## 2024-05-17 DIAGNOSIS — I2510 Atherosclerotic heart disease of native coronary artery without angina pectoris: Secondary | ICD-10-CM | POA: Diagnosis not present

## 2024-05-17 DIAGNOSIS — F1011 Alcohol abuse, in remission: Secondary | ICD-10-CM | POA: Diagnosis present

## 2024-05-17 DIAGNOSIS — Z79899 Other long term (current) drug therapy: Secondary | ICD-10-CM

## 2024-05-17 DIAGNOSIS — Z8051 Family history of malignant neoplasm of kidney: Secondary | ICD-10-CM

## 2024-05-17 DIAGNOSIS — R414 Neurologic neglect syndrome: Secondary | ICD-10-CM | POA: Diagnosis present

## 2024-05-17 DIAGNOSIS — R531 Weakness: Secondary | ICD-10-CM | POA: Diagnosis not present

## 2024-05-17 DIAGNOSIS — Z7902 Long term (current) use of antithrombotics/antiplatelets: Secondary | ICD-10-CM

## 2024-05-17 DIAGNOSIS — E1165 Type 2 diabetes mellitus with hyperglycemia: Secondary | ICD-10-CM | POA: Diagnosis not present

## 2024-05-17 DIAGNOSIS — Z981 Arthrodesis status: Secondary | ICD-10-CM | POA: Diagnosis not present

## 2024-05-17 DIAGNOSIS — I69319 Unspecified symptoms and signs involving cognitive functions following cerebral infarction: Secondary | ICD-10-CM

## 2024-05-17 DIAGNOSIS — Z886 Allergy status to analgesic agent status: Secondary | ICD-10-CM

## 2024-05-17 DIAGNOSIS — Z96653 Presence of artificial knee joint, bilateral: Secondary | ICD-10-CM | POA: Diagnosis present

## 2024-05-17 DIAGNOSIS — Z7984 Long term (current) use of oral hypoglycemic drugs: Secondary | ICD-10-CM | POA: Diagnosis not present

## 2024-05-17 DIAGNOSIS — G43009 Migraine without aura, not intractable, without status migrainosus: Secondary | ICD-10-CM | POA: Diagnosis present

## 2024-05-17 DIAGNOSIS — Z880 Allergy status to penicillin: Secondary | ICD-10-CM

## 2024-05-17 DIAGNOSIS — Z8249 Family history of ischemic heart disease and other diseases of the circulatory system: Secondary | ICD-10-CM | POA: Diagnosis not present

## 2024-05-17 DIAGNOSIS — F17210 Nicotine dependence, cigarettes, uncomplicated: Secondary | ICD-10-CM | POA: Diagnosis present

## 2024-05-17 DIAGNOSIS — Z833 Family history of diabetes mellitus: Secondary | ICD-10-CM | POA: Diagnosis not present

## 2024-05-17 DIAGNOSIS — R911 Solitary pulmonary nodule: Secondary | ICD-10-CM | POA: Diagnosis present

## 2024-05-17 DIAGNOSIS — I63511 Cerebral infarction due to unspecified occlusion or stenosis of right middle cerebral artery: Secondary | ICD-10-CM

## 2024-05-17 DIAGNOSIS — I6782 Cerebral ischemia: Secondary | ICD-10-CM | POA: Diagnosis not present

## 2024-05-17 DIAGNOSIS — F418 Other specified anxiety disorders: Secondary | ICD-10-CM | POA: Diagnosis not present

## 2024-05-17 DIAGNOSIS — E119 Type 2 diabetes mellitus without complications: Secondary | ICD-10-CM | POA: Diagnosis present

## 2024-05-17 DIAGNOSIS — Z720 Tobacco use: Secondary | ICD-10-CM | POA: Diagnosis present

## 2024-05-17 DIAGNOSIS — I10 Essential (primary) hypertension: Secondary | ICD-10-CM | POA: Diagnosis not present

## 2024-05-17 DIAGNOSIS — N182 Chronic kidney disease, stage 2 (mild): Secondary | ICD-10-CM | POA: Diagnosis present

## 2024-05-17 DIAGNOSIS — E785 Hyperlipidemia, unspecified: Secondary | ICD-10-CM | POA: Diagnosis present

## 2024-05-17 DIAGNOSIS — E041 Nontoxic single thyroid nodule: Secondary | ICD-10-CM | POA: Diagnosis present

## 2024-05-17 DIAGNOSIS — Z794 Long term (current) use of insulin: Secondary | ICD-10-CM | POA: Diagnosis not present

## 2024-05-17 DIAGNOSIS — F410 Panic disorder [episodic paroxysmal anxiety] without agoraphobia: Secondary | ICD-10-CM | POA: Diagnosis present

## 2024-05-17 DIAGNOSIS — Z801 Family history of malignant neoplasm of trachea, bronchus and lung: Secondary | ICD-10-CM

## 2024-05-17 DIAGNOSIS — N179 Acute kidney failure, unspecified: Secondary | ICD-10-CM | POA: Diagnosis present

## 2024-05-17 DIAGNOSIS — D72829 Elevated white blood cell count, unspecified: Secondary | ICD-10-CM | POA: Diagnosis present

## 2024-05-17 DIAGNOSIS — J9601 Acute respiratory failure with hypoxia: Secondary | ICD-10-CM | POA: Diagnosis present

## 2024-05-17 DIAGNOSIS — R209 Unspecified disturbances of skin sensation: Secondary | ICD-10-CM | POA: Diagnosis not present

## 2024-05-17 DIAGNOSIS — F331 Major depressive disorder, recurrent, moderate: Secondary | ICD-10-CM | POA: Diagnosis present

## 2024-05-17 DIAGNOSIS — R918 Other nonspecific abnormal finding of lung field: Secondary | ICD-10-CM | POA: Diagnosis not present

## 2024-05-17 DIAGNOSIS — I63411 Cerebral infarction due to embolism of right middle cerebral artery: Secondary | ICD-10-CM

## 2024-05-17 DIAGNOSIS — C3490 Malignant neoplasm of unspecified part of unspecified bronchus or lung: Secondary | ICD-10-CM | POA: Diagnosis not present

## 2024-05-17 DIAGNOSIS — Z91013 Allergy to seafood: Secondary | ICD-10-CM

## 2024-05-17 DIAGNOSIS — M1990 Unspecified osteoarthritis, unspecified site: Secondary | ICD-10-CM | POA: Diagnosis present

## 2024-05-17 DIAGNOSIS — K219 Gastro-esophageal reflux disease without esophagitis: Secondary | ICD-10-CM | POA: Diagnosis present

## 2024-05-17 DIAGNOSIS — Z881 Allergy status to other antibiotic agents status: Secondary | ICD-10-CM

## 2024-05-17 DIAGNOSIS — T380X5A Adverse effect of glucocorticoids and synthetic analogues, initial encounter: Secondary | ICD-10-CM | POA: Diagnosis present

## 2024-05-17 DIAGNOSIS — J441 Chronic obstructive pulmonary disease with (acute) exacerbation: Secondary | ICD-10-CM | POA: Diagnosis not present

## 2024-05-17 DIAGNOSIS — E876 Hypokalemia: Secondary | ICD-10-CM | POA: Diagnosis present

## 2024-05-17 DIAGNOSIS — G720 Drug-induced myopathy: Secondary | ICD-10-CM | POA: Diagnosis present

## 2024-05-17 DIAGNOSIS — Z90710 Acquired absence of both cervix and uterus: Secondary | ICD-10-CM

## 2024-05-17 DIAGNOSIS — Z823 Family history of stroke: Secondary | ICD-10-CM

## 2024-05-17 DIAGNOSIS — J449 Chronic obstructive pulmonary disease, unspecified: Secondary | ICD-10-CM | POA: Diagnosis not present

## 2024-05-17 DIAGNOSIS — I69398 Other sequelae of cerebral infarction: Secondary | ICD-10-CM | POA: Diagnosis not present

## 2024-05-17 DIAGNOSIS — S0990XA Unspecified injury of head, initial encounter: Secondary | ICD-10-CM | POA: Diagnosis not present

## 2024-05-17 DIAGNOSIS — Z888 Allergy status to other drugs, medicaments and biological substances status: Secondary | ICD-10-CM

## 2024-05-17 DIAGNOSIS — Z882 Allergy status to sulfonamides status: Secondary | ICD-10-CM

## 2024-05-17 HISTORY — PX: LOOP RECORDER INSERTION: EP1214

## 2024-05-17 LAB — BASIC METABOLIC PANEL WITH GFR
Anion gap: 15 (ref 5–15)
BUN: 30 mg/dL — ABNORMAL HIGH (ref 8–23)
CO2: 23 mmol/L (ref 22–32)
Calcium: 8.8 mg/dL — ABNORMAL LOW (ref 8.9–10.3)
Chloride: 98 mmol/L (ref 98–111)
Creatinine, Ser: 0.89 mg/dL (ref 0.44–1.00)
GFR, Estimated: >60 mL/min (ref >60)
Glucose, Bld: 148 mg/dL — ABNORMAL HIGH (ref 70–99)
Potassium: 3.6 mmol/L (ref 3.5–5.1)
Sodium: 136 mmol/L (ref 135–145)

## 2024-05-17 LAB — CBC
HCT: 38.7 % (ref 36.0–46.0)
Hemoglobin: 13.1 g/dL (ref 12.0–15.0)
MCH: 31.9 pg (ref 26.0–34.0)
MCHC: 33.9 g/dL (ref 30.0–36.0)
MCV: 94.2 fL (ref 80.0–100.0)
Platelets: 313 K/uL (ref 150–400)
RBC: 4.11 MIL/uL (ref 3.87–5.11)
RDW: 12.2 % (ref 11.5–15.5)
WBC: 14.1 K/uL — ABNORMAL HIGH (ref 4.0–10.5)
nRBC: 0.2 % (ref 0.0–0.2)

## 2024-05-17 LAB — GLUCOSE, CAPILLARY
Glucose-Capillary: 123 mg/dL — ABNORMAL HIGH (ref 70–99)
Glucose-Capillary: 112 mg/dL — ABNORMAL HIGH (ref 70–99)
Glucose-Capillary: 187 mg/dL — ABNORMAL HIGH (ref 70–99)
Glucose-Capillary: 242 mg/dL — ABNORMAL HIGH (ref 70–99)
Glucose-Capillary: 221 mg/dL — ABNORMAL HIGH (ref 70–99)

## 2024-05-17 LAB — MAGNESIUM: Magnesium: 1.9 mg/dL (ref 1.7–2.4)

## 2024-05-17 SURGERY — LOOP RECORDER INSERTION

## 2024-05-17 MED ORDER — BISACODYL 10 MG RE SUPP: 10.0000 mg | Freq: Every day | RECTAL | Status: DC | PRN

## 2024-05-17 MED ORDER — PREDNISONE 20 MG PO TABS: ORAL_TABLET | ORAL | Status: DC

## 2024-05-17 MED ORDER — BUDESONIDE 0.25 MG/2ML IN SUSP: 0.2500 mg | Freq: Two times a day (BID) | RESPIRATORY_TRACT | 12 refills | Status: DC

## 2024-05-17 MED ORDER — GUAIFENESIN-DM 100-10 MG/5ML PO SYRP: 5.0000 mL | ORAL_SOLUTION | Freq: Four times a day (QID) | ORAL | Status: DC | PRN

## 2024-05-17 MED ORDER — ROSUVASTATIN CALCIUM 5 MG PO TABS
5.0000 mg | ORAL_TABLET | Freq: Every day | ORAL | Status: DC
  Administered 2024-05-18 – 2024-06-01 (×15): 5 mg via ORAL
  Filled 2024-05-17 (×15): qty 1

## 2024-05-17 MED ORDER — INSULIN ASPART 100 UNIT/ML IJ SOLN
0.0000 [IU] | Freq: Every day | INTRAMUSCULAR | Status: DC
  Administered 2024-05-17: 2 [IU] via SUBCUTANEOUS

## 2024-05-17 MED ORDER — INSULIN ASPART 100 UNIT/ML IJ SOLN
3.0000 [IU] | Freq: Three times a day (TID) | INTRAMUSCULAR | Status: DC
  Administered 2024-05-18 – 2024-05-27 (×29): 3 [IU] via SUBCUTANEOUS

## 2024-05-17 MED ORDER — TRAZODONE HCL 50 MG PO TABS: 25.0000 mg | ORAL_TABLET | Freq: Every evening | ORAL | Status: DC | PRN

## 2024-05-17 MED ORDER — LIDOCAINE-EPINEPHRINE 1 %-1:100000 IJ SOLN
INTRAMUSCULAR | Status: AC
  Filled 2024-05-17: qty 1

## 2024-05-17 MED ORDER — BUDESONIDE 0.25 MG/2ML IN SUSP
0.2500 mg | Freq: Two times a day (BID) | RESPIRATORY_TRACT | Status: DC
  Administered 2024-05-17 – 2024-06-01 (×27): 0.25 mg via RESPIRATORY_TRACT
  Filled 2024-05-17 (×30): qty 2

## 2024-05-17 MED ORDER — PREDNISONE 20 MG PO TABS
40.0000 mg | ORAL_TABLET | Freq: Every day | ORAL | Status: AC
  Administered 2024-05-18: 40 mg via ORAL
  Filled 2024-05-17: qty 2

## 2024-05-17 MED ORDER — FLEET ENEMA RE ENEM: 1.0000 | ENEMA | Freq: Once | RECTAL | Status: DC | PRN

## 2024-05-17 MED ORDER — PROCHLORPERAZINE 25 MG RE SUPP: 12.5000 mg | Freq: Four times a day (QID) | RECTAL | Status: DC | PRN

## 2024-05-17 MED ORDER — CLOPIDOGREL BISULFATE 75 MG PO TABS
75.0000 mg | ORAL_TABLET | Freq: Every day | ORAL | Status: DC
  Administered 2024-05-18 – 2024-06-01 (×15): 75 mg via ORAL
  Filled 2024-05-17 (×15): qty 1

## 2024-05-17 MED ORDER — INSULIN ASPART 100 UNIT/ML IJ SOLN
0.0000 [IU] | Freq: Three times a day (TID) | INTRAMUSCULAR | Status: DC
  Administered 2024-05-18 (×2): 3 [IU] via SUBCUTANEOUS
  Administered 2024-05-19 – 2024-05-21 (×5): 2 [IU] via SUBCUTANEOUS
  Administered 2024-05-21: 3 [IU] via SUBCUTANEOUS
  Administered 2024-05-22 – 2024-05-23 (×4): 1 [IU] via SUBCUTANEOUS
  Administered 2024-05-23: 2 [IU] via SUBCUTANEOUS
  Administered 2024-05-23: 1 [IU] via SUBCUTANEOUS
  Administered 2024-05-24: 2 [IU] via SUBCUTANEOUS
  Administered 2024-05-25: 1 [IU] via SUBCUTANEOUS
  Administered 2024-05-25: 2 [IU] via SUBCUTANEOUS
  Administered 2024-05-26 (×2): 1 [IU] via SUBCUTANEOUS
  Administered 2024-05-27: 3 [IU] via SUBCUTANEOUS
  Administered 2024-05-28 – 2024-05-29 (×4): 1 [IU] via SUBCUTANEOUS

## 2024-05-17 MED ORDER — ENOXAPARIN SODIUM 40 MG/0.4ML IJ SOSY
40.0000 mg | PREFILLED_SYRINGE | INTRAMUSCULAR | Status: DC
  Administered 2024-05-17 – 2024-05-20 (×4): 40 mg via SUBCUTANEOUS
  Filled 2024-05-17 (×4): qty 0.4

## 2024-05-17 MED ORDER — FAMOTIDINE 20 MG PO TABS
20.0000 mg | ORAL_TABLET | Freq: Two times a day (BID) | ORAL | Status: DC
  Administered 2024-05-17 – 2024-06-01 (×30): 20 mg via ORAL
  Filled 2024-05-17 (×30): qty 1

## 2024-05-17 MED ORDER — PREDNISONE 20 MG PO TABS
20.0000 mg | ORAL_TABLET | Freq: Every day | ORAL | Status: AC
Start: 2024-05-19 — End: 2024-05-21
  Administered 2024-05-19 – 2024-05-21 (×3): 20 mg via ORAL
  Filled 2024-05-17 (×3): qty 1

## 2024-05-17 MED ORDER — INSULIN GLARGINE-YFGN 100 UNIT/ML ~~LOC~~ SOLN
15.0000 [IU] | Freq: Every day | SUBCUTANEOUS | Status: DC
  Administered 2024-05-18 – 2024-05-19 (×2): 15 [IU] via SUBCUTANEOUS
  Filled 2024-05-17 (×2): qty 0.15

## 2024-05-17 MED ORDER — GABAPENTIN 100 MG PO CAPS
100.0000 mg | ORAL_CAPSULE | Freq: Two times a day (BID) | ORAL | Status: DC
  Administered 2024-05-17 – 2024-06-01 (×30): 100 mg via ORAL
  Filled 2024-05-17 (×30): qty 1

## 2024-05-17 MED ORDER — NICOTINE 14 MG/24HR TD PT24: 14.0000 mg | MEDICATED_PATCH | Freq: Every day | TRANSDERMAL | 0 refills | Status: AC

## 2024-05-17 MED ORDER — PROCHLORPERAZINE MALEATE 5 MG PO TABS: 5.0000 mg | ORAL_TABLET | Freq: Four times a day (QID) | ORAL | Status: DC | PRN

## 2024-05-17 MED ORDER — IPRATROPIUM-ALBUTEROL 0.5-2.5 (3) MG/3ML IN SOLN: 3.0000 mL | RESPIRATORY_TRACT | Status: DC | PRN

## 2024-05-17 MED ORDER — ACETAMINOPHEN 325 MG PO TABS
325.0000 mg | ORAL_TABLET | ORAL | Status: DC | PRN
  Administered 2024-05-20 – 2024-05-21 (×4): 650 mg via ORAL
  Filled 2024-05-17 (×4): qty 2

## 2024-05-17 MED ORDER — NICOTINE 14 MG/24HR TD PT24
14.0000 mg | MEDICATED_PATCH | Freq: Every day | TRANSDERMAL | Status: DC
  Administered 2024-05-18 – 2024-06-01 (×16): 14 mg via TRANSDERMAL
  Filled 2024-05-17 (×15): qty 1

## 2024-05-17 MED ORDER — ARIPIPRAZOLE 10 MG PO TABS
20.0000 mg | ORAL_TABLET | Freq: Every day | ORAL | Status: DC
  Administered 2024-05-18 – 2024-06-01 (×15): 20 mg via ORAL
  Filled 2024-05-17 (×15): qty 2

## 2024-05-17 MED ORDER — AMITRIPTYLINE HCL 25 MG PO TABS
100.0000 mg | ORAL_TABLET | Freq: Every day | ORAL | Status: DC
  Administered 2024-05-17 – 2024-05-31 (×15): 100 mg via ORAL
  Filled 2024-05-17 (×15): qty 4

## 2024-05-17 MED ORDER — INSULIN ASPART 100 UNIT/ML IJ SOLN: INTRAMUSCULAR | Status: DC

## 2024-05-17 MED ORDER — ALUM & MAG HYDROXIDE-SIMETH 200-200-20 MG/5ML PO SUSP: 30.0000 mL | ORAL | Status: DC | PRN

## 2024-05-17 MED ORDER — LIDOCAINE-EPINEPHRINE 1 %-1:100000 IJ SOLN
INTRAMUSCULAR | Status: DC | PRN
  Administered 2024-05-17: 20 mL

## 2024-05-17 MED ORDER — IPRATROPIUM-ALBUTEROL 0.5-2.5 (3) MG/3ML IN SOLN
3.0000 mL | Freq: Two times a day (BID) | RESPIRATORY_TRACT | Status: DC
  Administered 2024-05-17 – 2024-05-23 (×11): 3 mL via RESPIRATORY_TRACT
  Filled 2024-05-17 (×12): qty 3

## 2024-05-17 MED ORDER — CLOPIDOGREL BISULFATE 75 MG PO TABS: 75.0000 mg | ORAL_TABLET | Freq: Every day | ORAL | 3 refills | Status: AC

## 2024-05-17 MED ORDER — PROCHLORPERAZINE EDISYLATE 10 MG/2ML IJ SOLN: 5.0000 mg | Freq: Four times a day (QID) | INTRAMUSCULAR | Status: DC | PRN

## 2024-05-17 SURGICAL SUPPLY — 2 items
MONITOR REVEAL LINQ II (Prosthesis & Implant Heart) IMPLANT
PACK LOOP INSERTION (CUSTOM PROCEDURE TRAY) ×2 IMPLANT

## 2024-05-17 NOTE — Consult Note (Addendum)
 ELECTROPHYSIOLOGY CONSULT NOTE  Patient ID: [name] MRN: [id number], DOB/AGE: 73-May-1952   Admit date: 05/11/2024 Date of Consult: 05/17/2024  Primary Physician: [name], FNP Primary Cardiologist: Darryle ONEIDA Decent, MD  Primary Electrophysiologist: New to None  Reason for Consultation: Cryptogenic stroke; recommendations regarding Implantable Loop Recorder Insurance: [facility] ACO  History of Present Illness:   73 y/o F with PMH of tobacco abuse, recent diagnosis of Stage I lung cancer.  EP has been asked to evaluate [name] for placement of an implantable loop recorder to monitor for atrial fibrillation by Dr Jerri.  The patient was admitted on 05/11/2024 with shortness of breath and possible allergic reaction to BC powder.  She had facial edema and was hypoxic. She was treated for allergic reaction.  Also noted to have left sided neglect with weakness and CODE STROKE was activated.  Imaging concerning for multifocal infarct, possible embolic source.    The pt has undergone workup for stroke including:  Stroke:  right MCA, ACA and MCA/PCA infarcts embolic pattern secondary to embolic source CT no acute finding CTA head and neck unremarkable CTP neg MRI  right MCA, ACA and MCA/PCA infarcts 2D Echo EF 60-65% LE venous doppler no DVT LDL 23 HgbA1c 7.4 UDS neg Lovenox for VTE prophylaxis No antithrombotic prior to admission, now on clopidogrel 75 mg daily. ASA listed in allergy Therapy recommendations:  CIR    The patient has been monitored on telemetry which has demonstrated sinus rhythm with no arrhythmias.  Inpatient stroke work-up [name] not require a TEE per Neurology.   Echocardiogram as above. Lab work is reviewed.  Prior to admission, the patient denies chest pain, shortness of breath, dizziness, palpitations, or syncope.  She is recovering from her stroke with plans to attend CIR  at discharge.  Allergies, Past Medical, Surgical, Social, and Family  Histories have been reviewed and are referenced here-in when relevant for medical decision making.   Inpatient Medications:   amitriptyline   100 mg Oral QHS   ARIPiprazole   20 mg Oral Daily   budesonide (PULMICORT) nebulizer solution  0.25 mg Nebulization BID   clopidogrel  75 mg Oral Daily   enoxaparin (LOVENOX) injection  40 mg Subcutaneous Q24H   famotidine   20 mg Oral BID   gabapentin   100 mg Oral BID   insulin  aspart  0-5 Units Subcutaneous QHS   insulin  aspart  0-9 Units Subcutaneous TID WC   insulin  aspart  3 Units Subcutaneous TID WC   insulin  glargine-yfgn  15 Units Subcutaneous Daily   ipratropium-albuterol   3 mL Nebulization BID   nicotine  14 mg Transdermal Daily   predniSONE  40 mg Oral Q breakfast   rosuvastatin   5 mg Oral Daily    Physical Exam: Vitals:   05/17/24 0006 05/17/24 0348 05/17/24 0752 05/17/24 0807  BP: (!) 141/65 (!) 164/78 (!) 151/77   Pulse: (!) 57 (!) 52 (!) 51 (!) 51  Resp:   18 18  Temp: 97.9 F (36.6 C) 97.8 F (36.6 C) 97.8 F (36.6 C)   TempSrc: Oral Oral    SpO2: 98% 98% 98% 98%  Height:        GEN- NAD. A&O x 3. Normal affect. HEENT: Normocephalic, atraumatic Lungs- CTAB, Normal effort.  Heart- Regular rate and rhythm rate and rhythm. No M/G/R.  Extremities- No peripheral edema. no clubbing or cyanosis Skin- warm and dry, no rash or lesion. Neuro: AAOx4, speech clear   12-lead ECG 05/11/24 >  SR 95 bpm (personally reviewed) All prior EKG's in EPIC reviewed with no documented atrial fibrillation  Telemetry SB-SR 50-90's with occ PVC, no AF identified on tele review (personally reviewed)  Assessment and Plan:  1. Cryptogenic Stroke The patient presents with cryptogenic stroke.  The patient does have a TEE planned for this AM.  I spoke at length with the patient about monitoring for afib with an implantable loop recorder, including monthly monitor fees which may range from $0-$40.  Risks, benefits, and alteratives to implantable  loop recorder were discussed with the patient today.  At this time, the patient is very clear in their decision to proceed with implantable loop recorder.      Wound care was reviewed with the patient (keep incision clean and dry for 3 days). Please call with questions.   Christy Barrack, NP-C, AGACNP-BC [facility] - Electrophysiology  05/17/2024, 11:46 AM   [name] was seen by me today along with [name]. I have personally performed an evaluation on this patient.  My findings are as follows: 73 y.o. female with a past history as above presented to the hospital with an allergic reaction, found to have left-sided neglect.  MRI showed MCA, ACA, PCA right sided infarcts and an embolic pattern.  She currently feels well.  She continues to have some weakness, but has no other complaints..  Data: EKG(s) and pertinent labs, studies, etc were personally reviewed and interpreted by me:  EKG, telemetry Otherwise, I agree with data as outlined by the advanced practice provider.  Exam performed by me: Gen: No acute distress Neck: No JVD Cardiac: Regular Lungs: Normal work of breathing Extremities: No edema  My Assessment and Plan: Cryptogenic stroke: At this point, no cause has been discovered.  She does have a long history of tobacco abuse and multiple family members with atrial fibrillation.  She would likely benefit from ILR implant to look for atrial fibrillation as a cause of her stroke.  Risks and benefits have been discussed.  This include bleeding and infection.  She understands the risks and has agreed to the procedure.  Signed,  Shamir Tuzzolino Gladis Norton, MD  05/17/2024 1:25 PM

## 2024-05-17 NOTE — Discharge Summary (Signed)
 Physician Discharge Summary   Patient: CIEANNA STORMES MRN: [id number] DOB: [birth year]  Admit date:     05/11/2024  Discharge date: 05/17/24  Discharge Physician: Elgie Butter   PCP: [name], FNP   Recommendations at discharge:  Please follow up with PCP in one week.  Pleas follow up with cbc and bmp in one week.  Please follow up with EP as recommended.  Please follow up with Neurology as scheduled.    Discharge Diagnoses: Principal Problem:   Acute hypoxemic respiratory failure (HCC) Active Problems:   Type 2 diabetes mellitus (HCC)   Acute left-sided weakness   AKI (acute kidney injury)   Hypokalemia   Acute CVA (cerebrovascular accident) [facility])   Hospital Course:   73 year old with history of SCC of the lung, COPD with ongoing tobacco use, HLD, depression, DM2, HTN presented with shortness of breath possible allergic reaction to BC powder.  Noted to have hypoxia and facial edema.  Patient was given Solu-Medrol , epinephrine  and Zofran  with EMS.  Also noted to have left-sided neglect with weakness.  Code stroke was activated and eventually workup was consistent with multifocal infarct, likely embolic source.  Routine workup was performed by neurology team and eventually determined patient will be on daily Plavix and statin.  She is allergic to aspirin .  Eventually started on steroids as well due to concerns of acute COPD exacerbation.  She continues to slowly improve.  We plan on transitioning patient to CIR and upon discharge she will get loop recorder.  EP team is aware.   Assessment and Plan:   Acute multifocal infarct Left-sided weakness - Patient was outside of TNK window.  CT head, CTA head and neck were unremarkable.  MRI was consistent with right-sided multifocal infarct.  LDL 23, A1c 7.4, UDS was negative. -Daily Plavix and statin.  Allergic to aspirin  - Echocardiogram preserved EF of 65% with mild LVH.   Tele showed bradycardia and a very brief pause. No AV  nodal blockers.  Briefly discussed with EP, no further workup indicated at this time.  Plans for loop recorder at the time of discharge from CIR.  EP team is aware. Currently waiting for CIR.    Acute hypoxia Acute COPD exacerbation - There was a question of possible BC powder allergic reaction due to facial swelling and hypoxia therefore received Solu-Medrol , epinephrine , Pepcid , albuterol .  CTA chest was negative for PE.  Transitioned to prednisone to complete the course.      Acute kidney injury, resolved -Creatinine peaked 1.2, now resolved at baseline 0.8   Leukocytosis, stable - Suspect reactive and secondary to steroids.  UA still pending to be collected.  Holding off on antibiotics   Hypokalemia Replaced.    Diabetes mellitus type 2, hyperglycemia steroid-induced Resume home meds.    Essential hypertension - well controlled.    Squamous cell carcinoma of the lung Active tobacco use - Follows outpatient oncology team   Hyperlipidemia - Statin   Mood disorder - Amitriptyline  and appeared preserved   Chronic pain - Gabapentin    Tobacco use - Nicotine patch      Consultants: neurology. EP team  Procedures performed: loop recorder placement.  Disposition: Rehabilitation facility Diet recommendation:  Cardiac and Carb modified diet DISCHARGE MEDICATION: Allergies as of 05/17/2024       Reactions   Bc Cherry [aspirin -caffeine] Anaphylaxis   Benadryl  [diphenhydramine  Hcl] Nausea And Vomiting, Other (See Comments)   Migraine headaches.   Lansoprazole Nausea And Vomiting, Other (See Comments)  Migraine headache Prevacid* due to dye in med    Morphine Sulfate Nausea And Vomiting, Other (See Comments)   Migraine headaches.   Red Dye #40 (allura Red) Other (See Comments)   Also allergic to [facility] Dye-migraine headaches.   Simvastatin Nausea And Vomiting, Other (See Comments)   Migraine   Cephalexin    Other Reaction(s): severe fatigue   Macrolides And  Ketolides Nausea And Vomiting   Other    Brown dye,medicine with brown dye   Codeine Nausea And Vomiting   Penicillins Nausea And Vomiting, Other (See Comments)   INTOLERANCE >  NAUSEA & VOMITING YEAST INFECTION > CHANGE IN NORMAL FLORA   Shellfish Allergy Other (See Comments)   Congestion Other Reaction(s): migraine   Sulfa Antibiotics Nausea And Vomiting        Medication List     TAKE these medications    albuterol  108 (90 Base) MCG/ACT inhaler Commonly known as: VENTOLIN  HFA Inhale 1-2 puffs into the lungs every 4 (four) hours as needed for wheezing or shortness of breath.   amitriptyline  25 MG tablet Commonly known as: ELAVIL  Take 100 mg by mouth at bedtime.   ARIPiprazole  20 MG tablet Commonly known as: ABILIFY  Take 20 mg by mouth daily.   budesonide 0.25 MG/2ML nebulizer solution Commonly known as: PULMICORT Take 2 mLs (0.25 mg total) by nebulization 2 (two) times daily.   calcium  carbonate 500 MG chewable tablet Commonly known as: TUMS - dosed in mg elemental calcium  Chew 2 tablets by mouth 4 (four) times daily as needed for indigestion or heartburn.   Cholecalciferol  1.25 MG (50000 UT) capsule Take 50,000 Units by mouth every 7 (seven) days.   clopidogrel 75 MG tablet Commonly known as: PLAVIX Take 1 tablet (75 mg total) by mouth daily. Start taking on: May 18, 2024   famotidine  20 MG tablet Commonly known as: PEPCID  Take 20 mg by mouth at bedtime.   gabapentin  100 MG capsule Commonly known as: NEURONTIN  Take 100 mg by mouth 2 (two) times daily.   insulin  aspart 100 UNIT/ML injection Commonly known as: novoLOG  CBG 70 - 120: 0 units  CBG 121 - 150: 0 units  CBG 151 - 200: 0 units  CBG 201 - 250: 2 units  CBG 251 - 300: 3 units  CBG 301 - 350: 4 units  CBG 351 - 400: 5 units   ipratropium-albuterol  0.5-2.5 (3) MG/3ML Soln Commonly known as: DUONEB Take 3 mLs by nebulization every 4 (four) hours as needed.   metFORMIN  500 MG 24 hr  tablet Commonly known as: GLUCOPHAGE -XR Take 1,000 mg by mouth 2 (two) times daily.   mupirocin ointment 2 % Commonly known as: BACTROBAN Apply 1 Application topically daily as needed (Blisters).   nicotine 14 mg/24hr patch Commonly known as: NICODERM CQ - dosed in mg/24 hours Place 1 patch (14 mg total) onto the skin daily. Start taking on: May 18, 2024   predniSONE 20 MG tablet Commonly known as: DELTASONE Prednisone 40 mg daily for 1 days followed by  Prednisone 20 mg daily for 3 days and stop. Start taking on: May 18, 2024   rosuvastatin  5 MG tablet Commonly known as: CRESTOR  Take 5 mg by mouth daily.   valsartan-hydrochlorothiazide 320-12.5 MG tablet Commonly known as: DIOVAN-HCT Take 1 tablet by mouth daily.        Follow-up Information     Gregg Lek, MD. Schedule an appointment as soon as possible for a visit in 1 month(s).   Specialty:  Neurology Contact information: [address] [place] [address] [phone]                Discharge Exam: There were no vitals filed for this visit. General exam: Appears calm and comfortable  Respiratory system: Clear to auscultation. Respiratory effort normal. Cardiovascular system: S1 & S2 heard, RRR. No JVD,  Gastrointestinal system: Abdomen is nondistended, soft and nontender. Central nervous system: Alert and oriented. No focal neurological deficits. Extremities: Symmetric 5 x 5 power. Skin: No rashes, lesions or ulcers Psychiatry:  Mood & affect appropriate.    Condition at discharge: fair  The results of significant diagnostics from this hospitalization (including imaging, microbiology, ancillary and laboratory) are listed below for reference.   Imaging Studies: ECHOCARDIOGRAM COMPLETE Result Date: 05/13/2024    ECHOCARDIOGRAM REPORT   Patient Name:   SHERIKA KUBICKI Date of Exam: 05/13/2024 Medical Rec #:  [id number]       Height:       64.0 in Accession #:    [id number]       Weight:       177.0 lb Date of Birth:  01-04-1951        BSA:          1.857 m Patient Age:    73 years        BP:           140/81 mmHg Patient Gender: F               HR:           77 bpm. Exam Location:  Inpatient Procedure: 2D Echo, Cardiac Doppler and Color Doppler (Both Spectral and Color            Flow Doppler were utilized during procedure). Indications:    Stroke I63.9  History:        Patient has prior history of Echocardiogram examinations, most                 recent 11/26/2020. COPD; Risk Factors:Hypertension and Diabetes.  Sonographer:    Jayson Gaskins Referring Phys: [id number] VASUNDHRA RATHORE IMPRESSIONS  1. Left ventricular ejection fraction, by estimation, is 60 to 65%. The left ventricle has normal function. The left ventricle has no regional wall motion abnormalities. There is mild concentric left ventricular hypertrophy. Left ventricular diastolic parameters are indeterminate.  2. Right ventricular systolic function is normal. The right ventricular size is normal. Tricuspid regurgitation signal is inadequate for assessing PA pressure.  3. The mitral valve is degenerative. Trivial mitral valve regurgitation.  4. The aortic valve is tricuspid. Aortic valve regurgitation is not visualized. Aortic valve sclerosis/calcification is present, without any evidence of aortic stenosis. Aortic valve mean gradient measures 3.0 mmHg.  5. The inferior vena cava is dilated in size with <50% respiratory variability, suggesting right atrial pressure of 15 mmHg. Comparison(s): Prior images reviewed side by side. LVEF 60-65%. No obvious interatrial shunting. FINDINGS  Left Ventricle: Left ventricular ejection fraction, by estimation, is 60 to 65%. The left ventricle has normal function. The left ventricle has no regional wall motion abnormalities. The left ventricular internal cavity size was normal in size. There is  mild concentric left ventricular hypertrophy. Left ventricular diastolic parameters are  indeterminate. Right Ventricle: The right ventricular size is normal. No increase in right ventricular wall thickness. Right ventricular systolic function is normal. Tricuspid regurgitation signal is inadequate for assessing PA pressure. Left Atrium: Left atrial size was normal in size. Right Atrium:  Right atrial size was normal in size. Pericardium: There is no evidence of pericardial effusion. Presence of epicardial fat layer. Mitral Valve: The mitral valve is degenerative in appearance. Mild to moderate mitral annular calcification. Trivial mitral valve regurgitation. MV peak gradient, 8.0 mmHg. The mean mitral valve gradient is 4.0 mmHg. Tricuspid Valve: The tricuspid valve is grossly normal. Tricuspid valve regurgitation is trivial. Aortic Valve: The aortic valve is tricuspid. There is mild aortic valve annular calcification. Aortic valve regurgitation is not visualized. Aortic valve sclerosis/calcification is present, without any evidence of aortic stenosis. Aortic valve mean gradient measures 3.0 mmHg. Aortic valve peak gradient measures 5.0 mmHg. Aortic valve area, by VTI measures 2.05 cm. Pulmonic Valve: The pulmonic valve was not well visualized. Pulmonic valve regurgitation is trivial. Aorta: The aortic root and ascending aorta are structurally normal, with no evidence of dilitation. Venous: The inferior vena cava is dilated in size with less than 50% respiratory variability, suggesting right atrial pressure of 15 mmHg. IAS/Shunts: No atrial level shunt detected by color flow Doppler. Additional Comments: 3D was performed not requiring image post processing on an independent workstation and was indeterminate.  LEFT VENTRICLE PLAX 2D LVIDd:         3.90 cm   Diastology LVIDs:         2.60 cm   LV e' medial:    7.40 cm/s LV PW:         1.10 cm   LV E/e' medial:  16.1 LV IVS:        1.20 cm   LV e' lateral:   8.92 cm/s LVOT diam:     1.80 cm   LV E/e' lateral: 13.3 LV SV:         46 LV SV Index:   25 LVOT  Area:     2.54 cm  RIGHT VENTRICLE RV S prime:     20.10 cm/s TAPSE (M-mode): 2.2 cm LEFT ATRIUM           Index        RIGHT ATRIUM          Index LA Vol (A2C): 34.4 ml 18.52 ml/m  RA Area:     9.68 cm LA Vol (A4C): 51.5 ml 27.73 ml/m  RA Volume:   17.50 ml 9.42 ml/m  AORTIC VALVE AV Area (Vmax):    2.10 cm AV Area (Vmean):   2.02 cm AV Area (VTI):     2.05 cm AV Vmax:           112.00 cm/s AV Vmean:          82.300 cm/s AV VTI:            0.225 m AV Peak Grad:      5.0 mmHg AV Mean Grad:      3.0 mmHg LVOT Vmax:         92.60 cm/s LVOT Vmean:        65.200 cm/s LVOT VTI:          0.181 m LVOT/AV VTI ratio: 0.80  AORTA Ao Root diam: 3.10 cm MITRAL VALVE MV Area (PHT): 3.50 cm     SHUNTS MV Area VTI:   1.19 cm     Systemic VTI:  0.18 m MV Peak grad:  8.0 mmHg     Systemic Diam: 1.80 cm MV Mean grad:  4.0 mmHg MV Vmax:       1.42 m/s MV Vmean:      92.9 cm/s MV Decel  Time: 217 msec MV E velocity: 119.00 cm/s MV A velocity: 123.00 cm/s MV E/A ratio:  0.97 Jayson Sierras MD Electronically signed by Jayson Sierras MD Signature Date/Time: 05/13/2024/12:39:45 PM    Final    VAS US  LOWER EXTREMITY VENOUS (DVT) Result Date: 05/12/2024  Lower Venous DVT Study Patient Name:  SHARISE LIPPY  Date of Exam:   05/12/2024 Medical Rec #: [id number]        Accession #:    [id number] Date of Birth: May 31, 1951         Patient Gender: F Patient Age:   72 years Exam Location:  [facility] Procedure:      VAS US  LOWER EXTREMITY VENOUS (DVT) Referring Phys: ARY XU --------------------------------------------------------------------------------  Indications: Stroke in cancer patient.  Risk Factors: Newly diagnosed squamous cell cancer of the lung. Comparison Study: Prior negative left LEV done 04/15/20 at [facility] Performing Technologist: Alberta Lis RVS  Examination Guidelines: A complete evaluation includes B-mode imaging, spectral Doppler, color Doppler, and power Doppler as needed of all accessible portions of  each vessel. Bilateral testing is considered an integral part of a complete examination. Limited examinations for reoccurring indications may be performed as noted. The reflux portion of the exam is performed with the patient in reverse Trendelenburg.  +---------+---------------+---------+-----------+----------+--------------+ RIGHT    CompressibilityPhasicitySpontaneityPropertiesThrombus Aging +---------+---------------+---------+-----------+----------+--------------+ CFV      Full           Yes      Yes                                 +---------+---------------+---------+-----------+----------+--------------+ SFJ      Full                                                        +---------+---------------+---------+-----------+----------+--------------+ FV Prox  Full           Yes      Yes                                 +---------+---------------+---------+-----------+----------+--------------+ FV Mid   Full           Yes      Yes                                 +---------+---------------+---------+-----------+----------+--------------+ FV DistalFull           Yes      Yes                                 +---------+---------------+---------+-----------+----------+--------------+ PFV      Full           Yes      Yes                                 +---------+---------------+---------+-----------+----------+--------------+ POP      Full                                                        +---------+---------------+---------+-----------+----------+--------------+  PTV      Full                                                        +---------+---------------+---------+-----------+----------+--------------+ PERO     Full                                                        +---------+---------------+---------+-----------+----------+--------------+   +---------+---------------+---------+-----------+----------+--------------+ LEFT      CompressibilityPhasicitySpontaneityPropertiesThrombus Aging +---------+---------------+---------+-----------+----------+--------------+ CFV      Full           Yes      Yes                                 +---------+---------------+---------+-----------+----------+--------------+ SFJ      Full                                                        +---------+---------------+---------+-----------+----------+--------------+ FV Prox  Full           Yes      Yes                                 +---------+---------------+---------+-----------+----------+--------------+ FV Mid   Full                                                        +---------+---------------+---------+-----------+----------+--------------+ FV DistalFull                                                        +---------+---------------+---------+-----------+----------+--------------+ PFV      Full           Yes      Yes                                 +---------+---------------+---------+-----------+----------+--------------+ POP      Full           Yes      Yes                                 +---------+---------------+---------+-----------+----------+--------------+ PTV      Full                                                        +---------+---------------+---------+-----------+----------+--------------+  PERO     Full                                                        +---------+---------------+---------+-----------+----------+--------------+     Summary: BILATERAL: - No evidence of deep vein thrombosis seen in the lower extremities, bilaterally. -No evidence of popliteal cyst, bilaterally.   *See table(s) above for measurements and observations. Electronically signed by Gaile New MD on 05/12/2024 at 10:12:34 PM.    Final    MR BRAIN WO CONTRAST Result Date: 05/12/2024 EXAM: MR Brain without Intravenous Contrast. CLINICAL HISTORY: 73 year old female with acute neuro  deficit, stroke suspected, presenting yesterday. TECHNIQUE: Magnetic resonance images of the brain without intravenous contrast in multiple planes. CONTRAST: Without. COMPARISON: Head CT and CTA head and neck 05/11/2024, brain MRI 05/08/2024. FINDINGS: BRAIN: Widely scattered small and patchy areas of diffusion restriction in the right hemisphere correspond to the right MCA territory, and the right MCA/PCA watershed area. Confluent superior perirolandic involvement although postcentral involvement predominates (series 5 image 44). Minimal involvement of the right caudate, basal ganglia otherwise spared. No contralateral left hemisphere or posterior fossa diffusion restriction. T2 and FLAIR hyperintense cytotoxic edema in the affected areas and there is scattered petechial hemorrhage in the right parietal and occipital lobe areas of involvement, new since 05/08/2024. No malignant hemorrhagic transformation. No intracranial mass effect. No midline shift or extra-axial fluid collection. Preexisting widespread chronic white matter T2 and FLAIR hyperintensity again noted. No cerebellar tonsillar ectopia. The central arterial and venous flow voids are patent. VENTRICLES: No hydrocephalus. ORBITS: The orbits are normal. SINUSES AND MASTOIDS: The sinuses and mastoid air cells are clear. BONES: No acute fracture or focal osseous lesion. IMPRESSION: 1. Widely scattered small Acute infarcts in the right MCA and right MCA/PCA watershed territories. 2. Cytotoxic edema and scattered acute petechial hemorrhage (right parietal and occipital). No malignant hemorrhagic transformation. No intracranial mass effect. 3. Underlying chronic white matter disease. Electronically signed by: Helayne Hurst MD 05/12/2024 10:05 AM EDT RP Workstation: [id number]   CT ANGIO HEAD NECK W WO CM W PERF (CODE STROKE) Result Date: 05/11/2024 EXAM: CTA Head and Neck with Perfusion 05/11/2024 06:08:14 PM TECHNIQUE: CTA of the head and neck was performed  without and with the administration of 100 mL of intravenous contrast (iohexol  (OMNIPAQUE ) 350 MG/ML injection 100 mL IOHEXOL  350 MG/ML SOLN). 3D postprocessing with multiplanar reconstructions and MIPs was performed to evaluate the vascular anatomy. Cerebral perfusion analysis using computed tomography with contrast administration, including post-processing of parametric maps with determination of cerebral blood flow, cerebral blood volume, mean transit time and time-to-maximum. Automated exposure control, iterative reconstruction, and/or weight based adjustment of the mA/kV was utilized to reduce the radiation dose to as low as reasonably achievable. COMPARISON: None available CLINICAL HISTORY: Neuro deficit, acute, stroke suspected. Left sided weakness. FINDINGS: CTA NECK: AORTIC ARCH AND ARCH VESSELS: Common origin of left common carotid artery and a dominant artery is noted. Atherosclerotic changes are present at the aorta and at the origin of the left subclavian artery without aneurysm or focal stenosis. No dissection or arterial injury. No significant stenosis of the brachiocephalic or subclavian arteries. CERVICAL CAROTID ARTERIES: Atherosclerotic changes are present at the right carotid bifurcation and proximal right ICA without focal stenosis. Atherosclerotic changes are present at the left carotid bifurcation and  proximal left ICA without focal stenosis. No dissection, arterial injury, or hemodynamically significant stenosis by NASCET criteria. CERVICAL VERTEBRAL ARTERIES: The left vertebral artery is a dominant vessel. Moderate tortuosity is present in the V1 segment of the left vertebral artery without focal stenosis. No dissection, arterial injury, or significant stenosis. LUNGS AND MEDIASTINUM: Unremarkable. SOFT TISSUES: No acute abnormality. BONES: Grade 1 anterolisthesis and moderate right facet degenerative change leads to moderate right foraminal stenosis at C3-C4. CTA HEAD: ANTERIOR CIRCULATION:  No significant stenosis of the internal carotid arteries. No significant stenosis of the anterior cerebral arteries. No significant stenosis of the middle cerebral arteries. No aneurysm. POSTERIOR CIRCULATION: No significant stenosis of the posterior cerebral arteries. No significant stenosis of the basilar artery. No significant stenosis of the vertebral arteries. No aneurysm. OTHER: No dural venous sinus thrombosis on this non-dedicated study. CT PERFUSION: EXAM QUALITY: Exam quality is adequate with diagnostic perfusion maps. No significant motion artifact. Appropriate arterial inflow and venous outflow curves. CORE INFARCT (CBF<30% volume): 0 mL TOTAL HYPOPERFUSION (Tmax>6s volume): 0 mL PENUMBRA: Mismatch volume: 0 mL IMPRESSION: 1. No acute large vessel occlusion. 2. No hemodynamically significant stenosis or aneurysm in the head or neck vessels. 3. No evidence of ischemia by CT brain perfusion. Electronically signed by: Lonni Necessary MD 05/11/2024 06:23 PM EDT RP Workstation: [id number]   CT HEAD CODE STROKE WO CONTRAST Result Date: 05/11/2024 EXAM: CT HEAD WITHOUT CONTRAST 05/11/2024 06:08:14 PM TECHNIQUE: CT of the head was performed without the administration of intravenous contrast. Automated exposure control, iterative reconstruction, and/or weight based adjustment of the mA/kV was utilized to reduce the radiation dose to as low as reasonably achievable. COMPARISON: None available. CLINICAL HISTORY: Neuro deficit, acute, stroke suspected. Left sided weakness. FINDINGS: BRAIN AND VENTRICLES: Intravascular contrast from CT angio chest of the same day reduces sensitivity for subarachnoid blood. No focal hemorrhage is present. Periventricular and scattered subcortical white matter hypoattenuation is moderately advanced for age. This most likely reflects the sequelae of chronic microvascular ischemia. No evidence of acute infarct. No hydrocephalus. No extra-axial collection. No mass effect or  midline shift. ORBITS: No acute abnormality. SINUSES: No acute abnormality. SOFT TISSUES AND SKULL: No acute soft tissue abnormality. No skull fracture. IMPRESSION: 1. No acute intracranial abnormality. Electronically signed by: Lonni Necessary MD 05/11/2024 06:13 PM EDT RP Workstation: [id number]   CT Angio Chest PE W and/or Wo Contrast Result Date: 05/11/2024 CLINICAL DATA:  Pulmonary embolism (PE) suspected, low to intermediate prob, positive D-dimer. Difficulty breathing. Possible allergic reaction. EXAM: CT ANGIOGRAPHY CHEST WITH CONTRAST TECHNIQUE: Multidetector CT imaging of the chest was performed using the standard protocol during bolus administration of intravenous contrast. Multiplanar CT image reconstructions and MIPs were obtained to evaluate the vascular anatomy. RADIATION DOSE REDUCTION: This exam was performed according to the departmental dose-optimization program which includes automated exposure control, adjustment of the mA and/or kV according to patient size and/or use of iterative reconstruction technique. CONTRAST:  75mL OMNIPAQUE  IOHEXOL  350 MG/ML SOLN COMPARISON:  None Available. FINDINGS: Cardiovascular: Scattered coronary artery and aortic atherosclerosis. Heart is normal size. Aorta is normal caliber. No filling defects in the pulmonary arteries to suggest pulmonary emboli. Mediastinum/Nodes: No mediastinal, hilar, or axillary adenopathy. Trachea and esophagus are unremarkable. 1.6 cm left thyroid  nodule, unchanged since prior study. This has been evaluated on previous imaging. (ref: J Am Coll Radiol. 2015 Feb;12(2): 143-50). Lungs/Pleura: Lingular nodule again noted measuring 1.4 cm in greatest diameter, unchanged. Biopsy fiducial adjacent to the nodule, unchanged since prior PET CT.  No new nodules. Bilateral lower lobe airway thickening with areas of mucous plugging and bibasilar atelectasis. No effusions. Upper Abdomen: No acute findings Musculoskeletal: Chest wall soft tissues  are unremarkable. No acute bony abnormality. Review of the MIP images confirms the above findings. IMPRESSION: No evidence of pulmonary embolus. Stable lingular nodule, 1.4 cm. Airway thickening with bibasilar atelectasis. Coronary artery disease. Aortic Atherosclerosis (ICD10-I70.0). Electronically Signed   By: Franky Crease M.D.   On: 05/11/2024 17:31   DG Chest Port 1 View Result Date: 05/11/2024 EXAM: 1 VIEW(S) XRAY OF THE CHEST 05/11/2024 01:50:00 PM COMPARISON: 04/23/2024 CLINICAL HISTORY: Allergic reaction [id number]. Per chart: Pt BIB EMS from Home due to sudden difficulty breathing after take her medication BC for migraines possible allergic reaction; initially SpO2 65% on room air. Hx of Lung Cancer and COPD, no O2 at home. Pt has an appointment with Cancer center ; at 2:30. FINDINGS: LUNGS AND PLEURA: Vague nodule with fiducial marker in left mid lung. No pulmonary edema. No pleural effusion. No pneumothorax. HEART AND MEDIASTINUM: No acute abnormality of the cardiac and mediastinal silhouettes. BONES AND SOFT TISSUES: No acute osseous abnormality. IMPRESSION: 1. No acute cardiopulmonary process. 2. Left mid-lung pulmonary   fiducial marker. Electronically signed by: Dayne Hassell MD 05/11/2024 02:10 PM EDT RP Workstation: [id number]   NM PET Image Restage (PS) Skull Base to Thigh (F-18 FDG) Result Date: 05/10/2024 CLINICAL DATA:  Subsequent treatment strategy for restaging of non-small-cell lung cancer. EXAM: NUCLEAR MEDICINE PET SKULL BASE TO THIGH TECHNIQUE: 8.9 mCi F-18 FDG was injected intravenously. Full-ring PET imaging was performed from the skull base to thigh after the radiotracer. CT data was obtained and used for attenuation correction and anatomic localization. Fasting blood glucose: 139 mg/dl COMPARISON:  91/79/7974 lung cancer screening CT. PET of 12/20/2023 also reviewed. FINDINGS: Mediastinal blood pool activity: SUV max 3.1 Liver activity: SUV max NA NECK: No areas of abnormal  hypermetabolism. Incidental CT findings: Bilateral carotid atherosclerosis. A left-sided 1.6 cm thyroid  nodule has been evaluated on 06/03/2022 ultrasound. No cervical adenopathy. CHEST: No thoracic nodal hypermetabolism. The lingular nodule is hypermetabolic. 1.6 x 1.3 cm and a S.U.V. max of 4.6 cm 36/7. Adjacent biopsy fiducial. Incidental CT findings: Centrilobular emphysema. Aortic and coronary artery calcification. ABDOMEN/PELVIS: No abdominopelvic parenchymal or nodal hypermetabolism. Incidental CT findings: Normal adrenal glands. Hepatic morphology again suspicious for mild cirrhosis. Cholecystectomy. Fat containing ventral abdominal wall laxity. Hysterectomy. SKELETON: No abnormal marrow activity. Incidental CT findings: Lumbosacral spine fixation. IMPRESSION: 1. Hypermetabolic 1.6 cm lingular nodule, without thoracic nodal or distant metastasis. T1b, N0, M0. Stage 1A. 2. Incidental findings, including: Aortic atherosclerosis (ICD10-I70.0), coronary artery atherosclerosis and emphysema (ICD10-J43.9). Probable mild cirrhosis. Electronically Signed   By: Rockey Kilts M.D.   On: 05/10/2024 15:08   MR BRAIN W WO CONTRAST Result Date: 05/09/2024 CLINICAL DATA:  Lung cancer EXAM: MRI HEAD WITHOUT AND WITH CONTRAST TECHNIQUE: Multiplanar, multiecho pulse sequences of the brain and surrounding structures were obtained without and with intravenous contrast. CONTRAST:  8mL GADAVIST GADOBUTROL 1 MMOL/ML IV SOLN COMPARISON:  None Available. FINDINGS: MRI brain: There are multiple foci of T2 hyperintensity in the cerebral white matter. These do not have restricted diffusion. The signal in the brain parenchyma is normal. No abnormal enhancement. There is no acute or chronic infarct. The ventricles are normal. No mass lesion. There are normal flow signals in the carotid arteries and basilar artery. No significant bone marrow signal abnormality. No significant abnormality in the paranasal sinuses or soft tissues.  IMPRESSION: No evidence of metastatic disease. There are multiple T2 hyperintensities within the cerebral white matter. These abnormalities are nonspecific and of uncertain etiology or clinical significance. They are more common in older patients and patients with risk factors for vascular disease. Electronically Signed   By: Nancyann Burns M.D.   On: 05/09/2024 09:22   DG Chest Port 1 View Result Date: 04/23/2024 CLINICAL DATA:  Status post bronchoscopy and biopsy. EXAM: PORTABLE CHEST 1 VIEW COMPARISON:  11/16/2016 and CT chest 03/21/2024. FINDINGS: Trachea is midline. Heart size normal. Fiducial marker in lingula, associated with a known nodule. Lungs are low in volume but otherwise clear. No pleural fluid. No pneumothorax. IMPRESSION: 1. No pneumothorax. 2. Placement of a fiducial marker at site of a known lingular nodule. Electronically Signed   By: Newell Eke M.D.   On: 04/23/2024 13:38   DG C-ARM BRONCHOSCOPY Result Date: 04/23/2024 C-ARM BRONCHOSCOPY: Fluoroscopy was utilized by the requesting physician.  No radiographic interpretation.    Microbiology: Results for orders placed or performed during the hospital encounter of 04/23/24  Surgical pcr screen     Status: None   Collection Time: 04/23/24  8:41 AM   Specimen: Nasal Mucosa; Nasal Swab  Result Value Ref Range Status   MRSA, PCR NEGATIVE NEGATIVE Final   Staphylococcus aureus NEGATIVE NEGATIVE Final    Comment: (NOTE) The Xpert SA Assay (FDA approved for NASAL specimens in patients 48 years of age and older), is one component of a comprehensive surveillance program. It is not intended to diagnose infection nor to guide or monitor treatment. Performed at [facility], [address], [place], [place] [address]   Fungus Culture With Stain     Status: None (Preliminary result)   Collection Time: 04/23/24 12:31 PM   Specimen: Bronchial Alveolar Lavage; Respiratory  Result Value Ref Range Status   Fungus Stain Final  report  Final    Comment: (NOTE) Performed At: [facility] [address] [place], [place] [id number] Jennette Shorter MD Ey:[id number]    Fungus (Mycology) Culture PENDING  Incomplete   Fungal Source BRONCHIAL ALVEOLAR LAVAGE  Final    Comment: Performed at [facility], [address], [place], [place] [address]  Culture, BAL-quantitative w Gram Stain     Status: None   Collection Time: 04/23/24 12:31 PM   Specimen: Bronchial Alveolar Lavage; Respiratory  Result Value Ref Range Status   Specimen Description BRONCHIAL ALVEOLAR LAVAGE  Final   Special Requests BAL LUL  Final   Gram Stain   Final    RARE WBC PRESENT, PREDOMINANTLY MONONUCLEAR NO ORGANISMS SEEN    Culture   Final    NO GROWTH 2 DAYS Performed at [facility], [address], [place], [place] [address]    Report Status 04/25/2024 FINAL  Final  Culture, Respiratory w Gram Stain     Status: None   Collection Time: 04/23/24 12:31 PM   Specimen: Bronchial Alveolar Lavage; Respiratory  Result Value Ref Range Status   Specimen Description BRONCHIAL ALVEOLAR LAVAGE  Final   Special Requests BAL LUL  Final   Gram Stain   Final    RARE WBC PRESENT, PREDOMINANTLY MONONUCLEAR NO ORGANISMS SEEN    Culture   Final    NO GROWTH 2 DAYS Performed at [facility], [address], [place], [place] [address]    Report Status 04/25/2024 FINAL  Final  Aerobic/Anaerobic Culture w Gram Stain (surgical/deep wound)  Status: None   Collection Time: 04/23/24 12:31 PM   Specimen: Bronchial Alveolar Lavage; Respiratory  Result Value Ref Range Status   Specimen Description BRONCHIAL ALVEOLAR LAVAGE  Final   Special Requests BAL LUL  Final   Gram Stain   Final    RARE WBC PRESENT, PREDOMINANTLY MONONUCLEAR NO ORGANISMS SEEN    Culture   Final    No growth aerobically or anaerobically. Performed at [facility], [address] [place], [place] [address]    Report Status 04/28/2024 FINAL  Final   Acid Fast Smear (AFB)     Status: None   Collection Time: 04/23/24 12:31 PM   Specimen: Bronchial Alveolar Lavage; Respiratory  Result Value Ref Range Status   AFB Specimen Processing Concentration  Final   Acid Fast Smear Negative  Final    Comment: (NOTE) Performed At: [facility] [address] [place], [place] [id number] Jennette Shorter MD Ey:[id number]    Source (AFB) BRONCHIAL ALVEOLAR LAVAGE  Final    Comment: Performed at [facility], [address], [place], [place] [address]  Fungus Culture Result     Status: None   Collection Time: 04/23/24 12:31 PM  Result Value Ref Range Status   Result 1 Comment  Final    Comment: (NOTE) KOH/Calcofluor preparation:  no fungus observed. Performed At: [facility] [address] [place], [place] [id number] Jennette Shorter MD Ey:[id number]     Labs: CBC: Recent Labs  Lab 05/11/24 1300 05/11/24 1848 05/12/24 0556 05/14/24 0418 05/15/24 0314 05/16/24 0312 05/17/24 0158  WBC 11.7*  --  21.6* 15.4* 14.0* 13.3* 14.1*  NEUTROABS 7.1  --   --   --   --   --   --   HGB 14.6   < > 11.8* 11.4* 11.8* 12.3 13.1  HCT 45.6   < > 35.7* 33.8* 34.8* 36.5 38.7  MCV 97.6  --  96.2 94.9 94.8 94.3 94.2  PLT 273  --  238 242 248 277 313   < > = values in this interval not displayed.   Basic Metabolic Panel: Recent Labs  Lab 05/12/24 0556 05/14/24 0418 05/15/24 0314 05/16/24 0312 05/17/24 0158  NA 136 136 136 137 136  K 4.9 4.7 4.4 3.8 3.6  CL 102 98 98 101 98  CO2 25 27 26 26 23   GLUCOSE 205* 230* 255* 142* 148*  BUN 16 20 24* 31* 30*  CREATININE 0.87 0.74 0.83 0.96 0.89  CALCIUM  8.7* 9.2 9.0 8.9 8.8*  MG  --  1.9 2.0 1.8 1.9  PHOS  --  3.9  --   --   --    Liver Function Tests: Recent Labs  Lab 05/11/24 1837  AST 27  ALT 12  ALKPHOS 74  BILITOT 0.2  PROT 5.9*  ALBUMIN 3.6   CBG: Recent Labs  Lab 05/16/24 1208 05/16/24 1522 05/16/24 2115 05/17/24 0616 05/17/24 0802  GLUCAP 168* 331* 128*  123* 112*    Discharge time spent: 36 minutes.   Signed: Elgie Butter, MD [facility] 05/17/2024

## 2024-05-17 NOTE — H&P (Shared)
 Physical Medicine and Rehabilitation Admission H&P    Chief Complaint  Patient presents with   Functional deficits due to CVA  : HPI: [name] is a 73 year old female with past medical history of tobacco abuse, squamous cell carcinoma of the lung (newly diagnosed 04/2024), COPD, coronary artery calcification, depression, type 2 diabetes, hypertension, migraines who presented to [facility] emergency department on 05/11/2024, with a sudden onset of shortness of breath. The patient had previously used BC powder for a headache and believed she was experiencing an allergic reaction. EMS administered Solu-Medrol , two rounds of epinephrine , and Zofran . In the emergency department, she was tachypneic but maintained oxygen  saturation at 96% on six liters of oxygen . A code stroke was activated due to left neglect and hemiparesis. She was not eligible for TNK treatment as she was outside the treatment window. ED labs showed a D-dimer of 7.84, ABG with a pH of 7.31, pCO2 of 49, and pO2 of 88. A CT angiogram was negative for pulmonary embolism, and a CT head was also negative. CTA of the head and neck indicated a large vessel occlusion, but CT perfusion showed no evidence of ischemia.  Neurology was consulted and a stroke workup was completed. An MRI confirmed infarcts in the right MCA, PCA, and ACA territories. Recommendations were made for a loop recorder to rule out a. Fib, she was placed on daily Plavix, as she is allergic to aspirin . The MRI did not reveal any brain metastases. The echo showed an ejection fraction of 65%, and her HbA1c was 7.4. Her hospital course was complicated by acute kidney injury, but she received IV fluid hydration and had mild hypokalemia, which was treated with potassium replacement. Prior to arrival, the patient was independent, driving, and did not use mobility devices. She resides in a two-level home with her significant other and currently requires moderate assistance with a  roller walker for functional mobility and transfers. Therapy evaluations completed due to patient decreased functional mobility was admitted for a comprehensive rehab program.       ROS Past Medical History:  Diagnosis Date   Alcohol abuse, in remission    Anxiety    Cancer of sigmoid colon (HCC) 07/2005   T2N0M0   Chronic bronchitis    COPD (chronic obstructive pulmonary disease) (HCC)    Depression    Diabetes mellitus without complication (HCC)    GERD (gastroesophageal reflux disease)    History of thyroid  nodule    monitoring   Hyperlipidemia    Hypertension    Migraines    OA (osteoarthritis)    PONV (postoperative nausea and vomiting)    Reflux    Past Surgical History:  Procedure Laterality Date   ABDOMINAL EXPOSURE N/A 11/24/2016   Procedure: ABDOMINAL EXPOSURE;  Surgeon: Krystal JULIANNA Doing, MD;  Location: [facility] OR;  Service: Vascular;  Laterality: N/A;   ABDOMINAL HYSTERECTOMY  1992   age 53 and appendics   ANTERIOR LUMBAR FUSION Bilateral 11/24/2016   Procedure: LUMBAR 5-SACRUM 1 ANTERIOR LUMBAR INTERBODY FUSION;  Surgeon: Oneil Priestly, MD;  Location: [facility] OR;  Service: Orthopedics;  Laterality: Bilateral;  LUMBAR 5-SACRUM 1 ANTERIOR LUMBAR INTERBODY FUSION    ARTHRODESIS METATARSALPHALANGEAL JOINT (MTPJ) Left 08/15/2023   Procedure: ARTHRODESIS METATARSALPHALANGEAL JOINT (MTPJ);  Surgeon: Tobie Franky SQUIBB, DPM;  Location: [facility] ORS;  Service: Orthopedics/Podiatry;  Laterality: Left;  POPLITEAL/SAPHENOUS BLOCK   bilateral breast surgery      benign tumors removed   CHOLECYSTECTOMY  2007   COLON RESECTION  2006   COLONOSCOPY  2019   HAND SURGERY  2010   repair of incisional hernia  2012   SHOULDER ARTHROSCOPY Left 2009   SHOULDER SURGERY Right 1997   rotator cuff   TONSILLECTOMY     as a child   TOTAL KNEE ARTHROPLASTY Left 08/11/2021   Procedure: TOTAL KNEE ARTHROPLASTY;  Surgeon: Beverley Evalene BIRCH, MD;  Location: [facility] ORS;  Service: Orthopedics;  Laterality: Left;    TOTAL KNEE ARTHROPLASTY Right 06/29/2022   Procedure: TOTAL KNEE ARTHROPLASTY;  Surgeon: Beverley Evalene BIRCH, MD;  Location: [facility] ORS;  Service: Orthopedics;  Laterality: Right;   VIDEO BRONCHOSCOPY WITH ENDOBRONCHIAL NAVIGATION N/A 04/23/2024   Procedure: VIDEO BRONCHOSCOPY WITH ENDOBRONCHIAL NAVIGATION;  Surgeon: Shelah Lamar RAMAN, MD;  Location: [facility] ENDOSCOPY;  Service: Pulmonary;  Laterality: N/A;  lingular nodule   Family History  Problem Relation Age of Onset   Heart disease Mother        Previous MI at age 47-55   Atrial fibrillation Mother    Stroke Mother    Heart failure Mother    Seizures Father        poss d/t alcohol   Cancer Father 89       Prostate cancer    Diabetes Sister    Arrhythmia Sister    Cancer Maternal Aunt        lung cancer    Cancer Maternal Uncle        kidney ca   Social History:  reports that she has been smoking cigarettes and e-cigarettes. She started smoking about 54 years ago. She has a 41.1 pack-year smoking history. She has never used smokeless tobacco. She reports that she does not drink alcohol and does not use drugs. Allergies:  Allergies  Allergen Reactions   Bc Cherry [Aspirin -Caffeine] Anaphylaxis   Benadryl  [Diphenhydramine  Hcl] Nausea And Vomiting and Other (See Comments)    Migraine headaches.   Lansoprazole Nausea And Vomiting and Other (See Comments)    Migraine headache  Prevacid* due to dye in med    Morphine Sulfate Nausea And Vomiting and Other (See Comments)    Migraine headaches.   Red Dye #40 (Allura Red) Other (See Comments)    Also allergic to [facility] Dye-migraine headaches.   Simvastatin Nausea And Vomiting and Other (See Comments)    Migraine    Cephalexin     Other Reaction(s): severe fatigue   Macrolides And Ketolides Nausea And Vomiting   Other     Brown dye,medicine with brown dye   Codeine Nausea And Vomiting   Penicillins Nausea And Vomiting and Other (See Comments)    INTOLERANCE >  NAUSEA & VOMITING YEAST  INFECTION > CHANGE IN NORMAL FLORA   Shellfish Allergy Other (See Comments)    Congestion  Other Reaction(s): migraine   Sulfa Antibiotics Nausea And Vomiting   Medications Prior to Admission  Medication Sig Dispense Refill   albuterol  (VENTOLIN  HFA) 108 (90 Base) MCG/ACT inhaler Inhale 1-2 puffs into the lungs every 4 (four) hours as needed for wheezing or shortness of breath.     amitriptyline  (ELAVIL ) 25 MG tablet Take 100 mg by mouth at bedtime.     ARIPiprazole  (ABILIFY ) 20 MG tablet Take 20 mg by mouth daily.     calcium  carbonate (TUMS - DOSED IN MG ELEMENTAL CALCIUM ) 500 MG chewable tablet Chew 2 tablets by mouth 4 (four) times daily as needed for indigestion or heartburn.     Cholecalciferol  1.25 MG (50000 UT) capsule Take  50,000 Units by mouth every 7 (seven) days.     famotidine  (PEPCID ) 20 MG tablet Take 20 mg by mouth at bedtime.     gabapentin  (NEURONTIN ) 100 MG capsule Take 100 mg by mouth 2 (two) times daily.     metFORMIN  (GLUCOPHAGE -XR) 500 MG 24 hr tablet Take 1,000 mg by mouth 2 (two) times daily.     mupirocin ointment (BACTROBAN) 2 % Apply 1 Application topically daily as needed (Blisters).     rosuvastatin  (CRESTOR ) 5 MG tablet Take 5 mg by mouth daily.     valsartan-hydrochlorothiazide (DIOVAN-HCT) 320-12.5 MG tablet Take 1 tablet by mouth daily.        Home: Home Living Family/patient expects to be discharged to:: Private residence Living Arrangements: Spouse/significant other Available Help at Discharge: Other (Comment) Type of Home: House Home Access: Stairs to enter [employer] of Steps: 1 Entrance Stairs-Rails: None Home Layout: Two level Alternate Level Stairs-Number of Steps: flight Alternate Level Stairs-Rails: Left, Right Bathroom Shower/Tub: Health visitor: Handicapped height Bathroom Accessibility: Yes Home Equipment: Grab bars - toilet, Grab bars - tub/shower, Shower seat - built in, [place] - single point, [employer] -  quad, Agricultural consultant (2 wheels) Additional Comments: lives with significant other, walk in shower downstairs on main level, has bathtub upstairs  Lives With: Significant other   Functional History: Prior Function Prior Level of Function : Independent/Modified Independent Mobility Comments: not using AD, recent fall 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  Functional Status:  Mobility: Bed Mobility Overal bed mobility: Needs Assistance Bed Mobility: Supine to Sit Supine to sit: Min assist Sit to supine: Min assist, Used rails General bed mobility comments: assistance with scooting towards EOB Transfers Overall transfer level: Needs assistance Equipment used: Rolling walker (2 wheels) Transfers: Sit to/from Stand, Bed to chair/wheelchair/BSC Sit to Stand: Min assist Bed to/from chair/wheelchair/BSC transfer type:: Step pivot Step pivot transfers: Mod assist General transfer comment: patient able to stand from EOB with min assist and during transfers patient had difficulty progress [name] and required assistance for balance Ambulation/Gait Ambulation/Gait assistance: Mod assist Gait Distance (Feet): 20 Feet (x20, x10, x10) Assistive device: Rolling walker (2 wheels) Gait Pattern/deviations: Step-to pattern, Decreased stride length, Decreased weight shift to right, Shuffle, Ataxic, Scissoring, Staggering left, Drifts right/left, Trunk flexed, Narrow base of support General Gait Details: occasional left lateral and posterior lean requiring ModA to correct. Ataxic gait pattern with decreased [name] proprioception. Cues to widen BOS, for RW proximity, and to lead with L LE. Gait velocity: dec Gait velocity interpretation: <1.31 ft/sec, indicative of household ambulator Pre-gait activities: Engineer, water march    ADL: ADL Overall ADL's : Needs assistance/impaired Eating/Feeding: Set up, Sitting Grooming: Set up, Sitting Upper Body Bathing: Minimal assistance, Moderate  assistance, Sitting Lower Body Bathing: Maximal assistance, Sitting/lateral leans Upper Body Dressing : Minimal assistance, Sitting Upper Body Dressing Details (indicate cue type and reason): gown change Lower Body Dressing: Maximal assistance, Sitting/lateral leans, Sit to/from stand Lower Body Dressing Details (indicate cue type and reason): donning shoes Toilet Transfer: Moderate assistance, Rolling walker (2 wheels) Toilet Transfer Details (indicate cue type and reason): simulated to recliner Toileting- Clothing Manipulation and Hygiene: Minimal assistance, Moderate assistance, Sitting/lateral lean Functional mobility during ADLs: Moderate assistance, Rolling walker (2 wheels) General ADL Comments: patient often dropping items when using [name]  Cognition: Cognition Overall Cognitive Status: Within Functional Limits for tasks assessed Arousal/Alertness: Awake/alert Orientation Level: Oriented X4 Attention: Sustained Sustained Attention: Appears intact Memory: Impaired Memory  Impairment: Decreased recall of new information (66% accurate) Awareness: Appears intact Problem Solving: Appears intact Cognition Arousal: Alert Behavior During Therapy: WFL for tasks assessed/performed Overall Cognitive Status: Within Functional Limits for tasks assessed  Physical Exam: Blood pressure (!) 151/77, pulse (!) 51, temperature 97.8 F (36.6 C), resp. rate 18, height 5' 4 (1.626 m), SpO2 98%. Physical Exam  Results for orders placed or performed during the hospital encounter of 05/11/24 (from the past 48 hours)  Urinalysis, Routine w reflex microscopic -Urine, Clean Catch     Status: Abnormal   Collection Time: 05/15/24  4:13 PM  Result Value Ref Range   Color, Urine YELLOW YELLOW   APPearance CLEAR CLEAR   Specific Gravity, Urine 1.015 1.005 - 1.030   pH 6.0 5.0 - 8.0   Glucose, UA >=500 (A) NEGATIVE mg/dL   Hgb urine dipstick NEGATIVE NEGATIVE   Bilirubin Urine NEGATIVE NEGATIVE    Ketones, ur NEGATIVE NEGATIVE mg/dL   Protein, ur NEGATIVE NEGATIVE mg/dL   Nitrite NEGATIVE NEGATIVE   Leukocytes,Ua NEGATIVE NEGATIVE   RBC / HPF 0-5 0 - 5 RBC/hpf   WBC, UA 0-5 0 - 5 WBC/hpf   Bacteria, UA RARE (A) NONE SEEN   Squamous Epithelial / HPF 0-5 0 - 5 /HPF    Comment: Performed at [facility], [address] [place], [place] [address]  Glucose, capillary     Status: Abnormal   Collection Time: 05/15/24  5:01 PM  Result Value Ref Range   Glucose-Capillary 338 (H) 70 - 99 mg/dL    Comment: Glucose reference range applies only to samples taken after fasting for at least 8 hours.  Glucose, capillary     Status: Abnormal   Collection Time: 05/15/24  9:04 PM  Result Value Ref Range   Glucose-Capillary 174 (H) 70 - 99 mg/dL    Comment: Glucose reference range applies only to samples taken after fasting for at least 8 hours.   Comment 1 Notify RN    Comment 2 Document in Chart   Basic metabolic panel with GFR     Status: Abnormal   Collection Time: 05/16/24  3:12 AM  Result Value Ref Range   Sodium 137 135 - 145 mmol/L   Potassium 3.8 3.5 - 5.1 mmol/L   Chloride 101 98 - 111 mmol/L   CO2 26 22 - 32 mmol/L   Glucose, Bld 142 (H) 70 - 99 mg/dL    Comment: Glucose reference range applies only to samples taken after fasting for at least 8 hours.   BUN 31 (H) 8 - 23 mg/dL   Creatinine, Ser 9.03 0.44 - 1.00 mg/dL   Calcium  8.9 8.9 - 10.3 mg/dL   GFR, Estimated >39 >39 mL/min    Comment: (NOTE) Calculated using the CKD-EPI Creatinine Equation (2021)    Anion gap 10 5 - 15    Comment: Performed at [facility], [address] [place], [place] [address]  CBC     Status: Abnormal   Collection Time: 05/16/24  3:12 AM  Result Value Ref Range   WBC 13.3 (H) 4.0 - 10.5 K/uL   RBC 3.87 3.87 - 5.11 MIL/uL   Hemoglobin 12.3 12.0 - 15.0 g/dL   HCT 63.4 63.9 - 53.9 %   MCV 94.3 80.0 - 100.0 fL   MCH 31.8 26.0 - 34.0 pg   MCHC 33.7 30.0 - 36.0 g/dL   RDW 87.7 88.4 -  84.4 %   Platelets 277 150 -  400 K/uL   nRBC 0.2 0.0 - 0.2 %    Comment: Performed at [facility], [address] [place], [place] [address]  Magnesium      Status: None   Collection Time: 05/16/24  3:12 AM  Result Value Ref Range   Magnesium  1.8 1.7 - 2.4 mg/dL    Comment: Performed at [facility], [address], [place], [place] [address]  Glucose, capillary     Status: Abnormal   Collection Time: 05/16/24  6:11 AM  Result Value Ref Range   Glucose-Capillary 134 (H) 70 - 99 mg/dL    Comment: Glucose reference range applies only to samples taken after fasting for at least 8 hours.  Glucose, capillary     Status: Abnormal   Collection Time: 05/16/24 12:08 PM  Result Value Ref Range   Glucose-Capillary 168 (H) 70 - 99 mg/dL    Comment: Glucose reference range applies only to samples taken after fasting for at least 8 hours.   Comment 1 Notify RN   Glucose, capillary     Status: Abnormal   Collection Time: 05/16/24  3:22 PM  Result Value Ref Range   Glucose-Capillary 331 (H) 70 - 99 mg/dL    Comment: Glucose reference range applies only to samples taken after fasting for at least 8 hours.   Comment 1 Notify RN   Glucose, capillary     Status: Abnormal   Collection Time: 05/16/24  9:15 PM  Result Value Ref Range   Glucose-Capillary 128 (H) 70 - 99 mg/dL    Comment: Glucose reference range applies only to samples taken after fasting for at least 8 hours.   Comment 1 Notify RN   Basic metabolic panel with GFR     Status: Abnormal   Collection Time: 05/17/24  1:58 AM  Result Value Ref Range   Sodium 136 135 - 145 mmol/L   Potassium 3.6 3.5 - 5.1 mmol/L   Chloride 98 98 - 111 mmol/L   CO2 23 22 - 32 mmol/L   Glucose, Bld 148 (H) 70 - 99 mg/dL    Comment: Glucose reference range applies only to samples taken after fasting for at least 8 hours.   BUN 30 (H) 8 - 23 mg/dL   Creatinine, Ser 9.10 0.44 - 1.00 mg/dL   Calcium  8.8 (L) 8.9 - 10.3 mg/dL   GFR, Estimated >39  >39 mL/min    Comment: (NOTE) Calculated using the CKD-EPI Creatinine Equation (2021)    Anion gap 15 5 - 15    Comment: Performed at [facility], [address] [place], [place] [address]  CBC     Status: Abnormal   Collection Time: 05/17/24  1:58 AM  Result Value Ref Range   WBC 14.1 (H) 4.0 - 10.5 K/uL   RBC 4.11 3.87 - 5.11 MIL/uL   Hemoglobin 13.1 12.0 - 15.0 g/dL   HCT 61.2 63.9 - 53.9 %   MCV 94.2 80.0 - 100.0 fL   MCH 31.9 26.0 - 34.0 pg   MCHC 33.9 30.0 - 36.0 g/dL   RDW 87.7 88.4 - 84.4 %   Platelets 313 150 - 400 K/uL   nRBC 0.2 0.0 - 0.2 %    Comment: Performed at [facility], [address], [place], [place] [address]  Magnesium      Status: None   Collection Time: 05/17/24  1:58 AM  Result Value Ref Range   Magnesium  1.9 1.7 - 2.4 mg/dL  Comment: Performed at [facility], [address] [place], [place] [address]  Glucose, capillary     Status: Abnormal   Collection Time: 05/17/24  6:16 AM  Result Value Ref Range   Glucose-Capillary 123 (H) 70 - 99 mg/dL    Comment: Glucose reference range applies only to samples taken after fasting for at least 8 hours.   Comment 1 Notify RN   Glucose, capillary     Status: Abnormal   Collection Time: 05/17/24  8:02 AM  Result Value Ref Range   Glucose-Capillary 112 (H) 70 - 99 mg/dL    Comment: Glucose reference range applies only to samples taken after fasting for at least 8 hours.  Glucose, capillary     Status: Abnormal   Collection Time: 05/17/24 12:11 PM  Result Value Ref Range   Glucose-Capillary 187 (H) 70 - 99 mg/dL    Comment: Glucose reference range applies only to samples taken after fasting for at least 8 hours.   No results found.    Blood pressure (!) 151/77, pulse (!) 51, temperature 97.8 F (36.6 C), resp. rate 18, height 5' 4 (1.626 m), SpO2 98%.  Medical Problem List and Plan: 1. Functional deficits secondary to right MCA, ACA and MCA/PCA infarcts embolic pattern secondary to  embolic source   -patient may *** shower  -ELOS/Goals: *** 2.  Antithrombotics: -DVT/anticoagulation:  Pharmaceutical: Lovenox  -antiplatelet therapy: Plavix (Asprin allergy)  3. Pain Management: Hx chronic pain-Gabapentin  100 mg twice daily.  Tylenol  prn  4. Mood/Behavior/Sleep: LCSW to follow for evaluation and support when available.   -antipsychotic agents: Amitriptyline  100 mg nightly, Abilify  20 mg daily  5. Neuropsych/cognition: This patient *** capable of making decisions on *** own behalf.  6. Skin/Wound Care: Routine pressure relief measures.  7. Fluids/Electrolytes/Nutrition: Monitor I&O, weights.  Labs in a.m.  -GERD: Pepcid    8. Acute multifocal focal infarct: Loop recorder to be placed by EP prior to CIR  - Continue Plavix and statin  9. COPD exacerbation:  Pulmicort and DuoNeb as scheduled. Confirmed with medicine *Prednisone 40 mg until 10/17 followed by 3 days of prednisone at 20 mg daily.  - DuoNeb as needed  10.  Lung CA: Recently diagnosed with lung cancer stage IA.  MRI brain no mets.  Follow-up with oncology  11. AKI: Creatinine peaked to 1.2, now at baseline 0.8.  Monitor CMP.  12. Leukocytosis: Suspect reactive secondary to steroids use.   -UA pending  13. HTN: Stable, no meds.  Long-term BP goal normotensive  14.  HLD: LDL 23 on Crestor  5 mg  15.  T2DM:  A1c 7.4.  Continue CBG monitoring AC/HS with SSI.   - NovoLog  SSI, Semglee 15 units daily  16.  Tobacco abuse: Smoking cessation also provided.   -Nicotine patch  ***  Daphne LOISE Satterfield, NP 05/17/2024

## 2024-05-17 NOTE — Care Management Important Message (Signed)
 Important Message  Patient Details  Name: [name] MRN: [id number] Date of Birth: 06-28-1951   Important Message Given:  Yes - Medicare IM     Claretta Deed 05/17/2024, 3:27 PM

## 2024-05-17 NOTE — Plan of Care (Signed)
  Problem: Consults Goal: RH STROKE PATIENT EDUCATION Description: See Patient Education module for education specifics  Outcome: Progressing   Problem: RH BOWEL ELIMINATION Goal: RH STG MANAGE BOWEL WITH ASSISTANCE Description: STG Manage Bowel with mod I Assistance. Outcome: Progressing Goal: RH STG MANAGE BOWEL W/MEDICATION W/ASSISTANCE Description: STG Manage Bowel with Medication with  mod I Assistance. Outcome: Progressing   Problem: RH SAFETY Goal: RH STG ADHERE TO SAFETY PRECAUTIONS W/ASSISTANCE/DEVICE Description: STG Adhere to Safety Precautions With cues  Assistance/Device. Outcome: Progressing   Problem: RH PAIN MANAGEMENT Goal: RH STG PAIN MANAGED AT OR BELOW PT'S PAIN GOAL Description: PAin < 4 with prns Outcome: Progressing   Problem: RH KNOWLEDGE DEFICIT Goal: RH STG INCREASE KNOWLEDGE OF DIABETES Description: Patient and S.O. will be able to manage DM using educational resources for medications and dietary modification independnetly Outcome: Progressing Goal: RH STG INCREASE KNOWLEDGE OF HYPERTENSION Description: Patient and S.O. will be able to manage HTN using educational resources for medications and dietary modification independnetly Outcome: Progressing Goal: RH STG INCREASE KNOWLEGDE OF HYPERLIPIDEMIA Description: Patient and S.O. will be able to manage HLD using educational resources for medications and dietary modification independnetly Outcome: Progressing Goal: RH STG INCREASE KNOWLEDGE OF STROKE PROPHYLAXIS Description: Patient and S.O. will be able to manage secondary risks using educational resources for medications and dietary modification independnetly Outcome: Progressing

## 2024-05-17 NOTE — Plan of Care (Signed)

## 2024-05-17 NOTE — Progress Notes (Signed)
 Inpatient Rehabilitation Admission Medication Review by a Pharmacist  A complete drug regimen review was completed for this patient to identify any potential clinically significant medication issues.  High Risk Drug Classes Is patient taking? Indication by Medication  Antipsychotic Yes Aripiprazole -mood disorder Compazine-N/V  Anticoagulant Yes Enoxaparin-VTE px  Antibiotic No   Opioid No   Antiplatelet Yes Plavix-CVA  Hypoglycemics/insulin  Yes Novolog , Lantus-T2DM  Vasoactive Medication No   Chemotherapy No   Other Yes Acetaminophen , gabapentin -pain Maalox, Pepcid -GERD Bisacodyl , fleets-constipation Guaifenesin/DM-cough Trazodone-sleep Amitriptyline -Mood disorder Pulmicort, duoneb, prednisone-COPD, allergic reaction Nicotine-dependance Crestor -HLD     Type of Medication Issue Identified Description of Issue Recommendation(s)  Drug Interaction(s) (clinically significant)     Duplicate Therapy     Allergy     No Medication Administration End Date     Incorrect Dose     Additional Drug Therapy Needed     Significant med changes from prior encounter (inform family/care partners about these prior to discharge). Patient on Metformin  and Valsartan/HCT PTA-placed on hold for acute admit Restart as needed  Other       Clinically significant medication issues were identified that warrant physician communication and completion of prescribed/recommended actions by midnight of the next day:  No  Name of provider notified for urgent issues identified:   Provider Method of Notification:     Pharmacist comments:   Time spent performing this drug regimen review (minutes):  20   Larraine Brazier, PharmD Clinical Pharmacist 05/17/2024  6:29 PM **Pharmacist phone directory can now be found on [email or web address] ([email or web address]).  Listed under [facility].

## 2024-05-17 NOTE — ED Provider Notes (Signed)
 [place] [facility] Provider Note   CSN: [id number] Arrival date & time: 05/11/24  1151     History {Add pertinent medical, surgical, social history, OB history to HPI:1} Chief Complaint  Patient presents with   Respiratory Distress    [name] is a 73 y.o. female with PMH as listed below who presents with respiratory distress.  Patient has a history of lung and colon cancer.  She presented for shortness of breath.  She associates this shortness of breath after she began taking BC powder for a headache and was curious if she was having an allergic reaction.  Initially SpO2 65% on room air.  She also reported facial swelling.  She received Solu-Medrol  and 2 rounds of epinephrine  by EMS as well as Zofran  for nausea.  On arrival to the ER she was satting 95% on 6 L nasal cannula.  During the patient's workup, the CT tech came into the room and noted that she was unable to lift her left arm to perform the CT PE.  When asked if it was new she stated yes.  Patient was also noted to be unable to lift her left leg.  On my evaluation of the patient she has neglect on the left side, and 3 out of 5 strength in the left upper extremity as well as 2 out of 5 strength in the left lower extremity.  Code stroke is called.  When asked when she was last normal patient states that she supposes last night when she went to sleep but she does feel weak on the left side but she had not previously noticed it.  She also notes that she may have had some trouble walking and balancing this morning.   Past Medical History:  Diagnosis Date   Alcohol abuse, in remission    Anxiety    Cancer of sigmoid colon (HCC) 07/2005   T2N0M0   Chronic bronchitis    COPD (chronic obstructive pulmonary disease) (HCC)    Depression    Diabetes mellitus without complication (HCC)    GERD (gastroesophageal reflux disease)    History of thyroid  nodule    monitoring   Hyperlipidemia    Hypertension     Migraines    OA (osteoarthritis)    PONV (postoperative nausea and vomiting)    Reflux        Home Medications Prior to Admission medications   Medication Sig Start Date End Date Taking? Authorizing Provider  albuterol  (VENTOLIN  HFA) 108 (90 Base) MCG/ACT inhaler Inhale 1-2 puffs into the lungs every 4 (four) hours as needed for wheezing or shortness of breath. 05/10/24  Yes [provider]  amitriptyline  (ELAVIL ) 25 MG tablet Take 100 mg by mouth at bedtime. 05/24/22  Yes [provider]  ARIPiprazole  (ABILIFY ) 20 MG tablet Take 20 mg by mouth daily. 02/12/20  Yes [provider]  calcium  carbonate (TUMS - DOSED IN MG ELEMENTAL CALCIUM ) 500 MG chewable tablet Chew 2 tablets by mouth 4 (four) times daily as needed for indigestion or heartburn.   Yes [provider]  Cholecalciferol  1.25 MG (50000 UT) capsule Take 50,000 Units by mouth every 7 (seven) days. 04/05/24  Yes [provider]  famotidine  (PEPCID ) 20 MG tablet Take 20 mg by mouth at bedtime.   Yes [provider]  gabapentin  (NEURONTIN ) 100 MG capsule Take 100 mg by mouth 2 (two) times daily.   Yes [provider]  metFORMIN  (GLUCOPHAGE -XR) 500 MG 24 hr tablet  Take 1,000 mg by mouth 2 (two) times daily. 07/31/20  Yes [provider]  mupirocin ointment (BACTROBAN) 2 % Apply 1 Application topically daily as needed (Blisters). 10/12/23  Yes [provider]  rosuvastatin  (CRESTOR ) 5 MG tablet Take 5 mg by mouth daily.   Yes [provider]  valsartan-hydrochlorothiazide (DIOVAN-HCT) 320-12.5 MG tablet Take 1 tablet by mouth daily. 06/10/23  Yes [provider]  budesonide (PULMICORT) 0.25 MG/2ML nebulizer solution Take 2 mLs (0.25 mg total) by nebulization 2 (two) times daily. 05/17/24   Akula, Vijaya, MD  clopidogrel (PLAVIX) 75 MG tablet Take 1 tablet (75 mg total) by mouth daily. 05/18/24   Akula, Vijaya, MD  insulin  aspart (NOVOLOG ) 100  UNIT/ML injection CBG 70 - 120: 0 units  CBG 121 - 150: 0 units  CBG 151 - 200: 0 units  CBG 201 - 250: 2 units  CBG 251 - 300: 3 units  CBG 301 - 350: 4 units  CBG 351 - 400: 5 units 05/17/24   Cherlyn Labella, MD  ipratropium-albuterol  (DUONEB) 0.5-2.5 (3) MG/3ML SOLN Take 3 mLs by nebulization every 4 (four) hours as needed. 05/17/24   Akula, Vijaya, MD  nicotine (NICODERM CQ - DOSED IN MG/24 HOURS) 14 mg/24hr patch Place 1 patch (14 mg total) onto the skin daily. 05/18/24   Akula, Vijaya, MD  predniSONE (DELTASONE) 20 MG tablet Prednisone 40 mg daily for 1 days followed by  Prednisone 20 mg daily for 3 days and stop. 05/18/24   Akula, Vijaya, MD      Allergies    Bc cherry [aspirin -caffeine], Benadryl  [diphenhydramine  hcl], Lansoprazole, Morphine sulfate, Red dye #40 (allura red), Simvastatin, Cephalexin, Macrolides and ketolides, Other, Codeine, Penicillins, Shellfish allergy, and Sulfa antibiotics    Review of Systems   Review of Systems A 10 point review of systems was performed and is negative unless otherwise reported in HPI.  Physical Exam Updated Vital Signs BP 96/79 (BP Location: Right Arm)   Pulse 88   Temp 98.2 F (36.8 C) (Oral)   Resp 18   Ht 5' 4 (1.626 m)   SpO2 91%   BMI 30.38 kg/m  Physical Exam General: Acutely ill-appearing elderly female, lying in bed.  HEENT: PERRLA, EOMI, sclera anicteric, MMM, trachea midline.  Tongue protrudes midline Cardiology: RRR, no murmurs/rubs/gallops. Resp: Tachypnea with decreased air movement but no wheezing rhonchi or crackles.  95% on 6 L nasal cannula..  Abd: Soft, non-tender, non-distended. No rebound tenderness or guarding.  GU: Deferred. MSK: No peripheral edema or signs of trauma. Extremities without deformity or TTP. Acral cyanosis.  Skin: warm, dry.  Back: No CVA tenderness Neuro: A&Ox4, CNs II-XII grossly intact.  5 out of 5 strength in the right upper and right lower extremities.  3 out of 5 strength in the left  upper extremity.  2 out of 5 strength in the left lower extremity.. Sensation grossly intact.  Notable neglect on the left upper and lower extremity.  1a  Level of consciousness: 0=alert; keenly responsive  1b. LOC questions:  0=Performs both tasks correctly  1c. LOC commands: 0=Performs both tasks correctly  2.  Best Gaze: 0=normal  3.  Visual: 2=Complete hemianopia  4. Facial Palsy: 1=Minor paralysis (flattened nasolabial fold, asymmetric on smiling)  5a.  Motor left arm: 1=Drift, limb holds 90 (or 45) degrees but drifts down before full 10 seconds: does not hit bed  5b.  Motor right arm: 0=No drift, limb holds 90 (or 45) degrees for full 10  seconds  6a. motor left leg: 3=No effort against gravity, limb falls  6b  Motor right leg:  0=No drift, limb holds 90 (or 45) degrees for full 10 seconds  7. Limb Ataxia: 0=Absent  8.  Sensory: 1=Mild to moderate sensory loss; patient feels pinprick is less sharp or is dull on the affected side; there is a loss of superficial pain with pinprick but patient is aware She is being touched  9. Best Language:  0=No aphasia, normal  10. Dysarthria: 0=Normal  11. Extinction and Inattention: 2=Profound hemi-inattention or hemi-inattention to more than one modality. Does not recognize own hand or orients only to one side of space   Total:   7        ED Results / Procedures / Treatments   Labs (all labs ordered are listed, but only abnormal results are displayed) Labs Reviewed  CBC WITH DIFFERENTIAL/PLATELET - Abnormal; Notable for the following components:      Result Value   WBC 11.7 (*)    Lymphs Abs 4.3 (*)    All other components within normal limits  BLOOD GAS, ARTERIAL - Abnormal; Notable for the following components:   pH, Arterial 7.31 (*)    pCO2 arterial 49 (*)    Acid-base deficit 2.4 (*)    All other components within normal limits  D-DIMER, QUANTITATIVE - Abnormal; Notable for the following components:   D-Dimer, Quant 7.84 (*)    All  other components within normal limits  COMPREHENSIVE METABOLIC PANEL WITH GFR - Abnormal; Notable for the following components:   Potassium 3.2 (*)    Glucose, Bld 256 (*)    Creatinine, Ser 1.15 (*)    Total Protein 5.9 (*)    GFR, Estimated 50 (*)    All other components within normal limits  I-STAT CHEM 8, ED - Abnormal; Notable for the following components:   Potassium 3.1 (*)    Creatinine, Ser 1.20 (*)    Glucose, Bld 256 (*)    All other components within normal limits  CBG MONITORING, ED - Abnormal; Notable for the following components:   Glucose-Capillary 252 (*)    All other components within normal limits  CBG MONITORING, ED - Abnormal; Notable for the following components:   Glucose-Capillary 251 (*)    All other components within normal limits  CBG MONITORING, ED - Abnormal; Notable for the following components:   Glucose-Capillary 190 (*)    All other components within normal limits  CBG MONITORING, ED - Abnormal; Notable for the following components:   Glucose-Capillary 258 (*)    All other components within normal limits  PROTIME-INR  APTT  ETHANOL  URINE DRUG SCREEN  PHOSPHORUS  MAGNESIUM   MAGNESIUM   MAGNESIUM   MAGNESIUM     EKG EKG Interpretation Date/Time:  Friday May 11 2024 12:00:19 EDT Ventricular Rate:  95 PR Interval:  173 QRS Duration:  70 QT Interval:  352 QTC Calculation: 443 R Axis:   -24  Text Interpretation: Sinus rhythm Borderline left axis deviation Low voltage, precordial leads Confirmed by Lenor Hollering ([id number]) on 05/11/2024 12:44:03 PM  Radiology CXR: 1. No acute cardiopulmonary process. 2. Left mid-lung pulmonary   fiducial marker.  CT PE: No evidence of pulmonary embolus. Stable lingular nodule, 1.4 cm. Airway thickening with bibasilar atelectasis. Coronary artery disease.  CTA H&N: 1. No acute large vessel occlusion. 2. No hemodynamically significant stenosis or aneurysm in the head or neck vessels. 3. No  evidence of ischemia by CT brain perfusion.  CTH:  1. No acute intracranial abnormality.   MRI brain wo contrast: pending   Procedures .Critical Care  Performed by: Franklyn Sid SAILOR, MD Authorized by: Franklyn Sid SAILOR, MD   Critical care provider statement:    Critical care time (minutes):  40   Critical care was necessary to treat or prevent imminent or life-threatening deterioration of the following conditions:  CNS failure or compromise and respiratory failure   Critical care was time spent personally by me on the following activities:  Development of treatment plan with patient or surrogate, discussions with consultants, evaluation of patient's response to treatment, examination of patient, ordering and review of laboratory studies, ordering and review of radiographic studies, ordering and performing treatments and interventions, pulse oximetry, re-evaluation of patient's condition, review of old charts and obtaining history from patient or surrogate   Care discussed with: admitting provider     {Document cardiac monitor, telemetry assessment procedure when appropriate:1}  Medications Ordered in ED Medications  famotidine  (PEPCID ) IVPB 20 mg premix (0 mg Intravenous Stopped 05/11/24 1424)  metoCLOPramide  (REGLAN ) injection 10 mg (10 mg Intravenous Given 05/11/24 1303)  magnesium  sulfate IVPB 2 g 50 mL (0 g Intravenous Stopped 05/11/24 1826)  iohexol  (OMNIPAQUE ) 350 MG/ML injection 75 mL (75 mLs Intravenous Contrast Given 05/11/24 1703)  iohexol  (OMNIPAQUE ) 350 MG/ML injection 100 mL (100 mLs Intravenous Contrast Given 05/11/24 1808)  clopidogrel (PLAVIX) tablet 300 mg (300 mg Oral Given 05/11/24 2203)  potassium chloride  SA (KLOR-CON  M) CR tablet 40 mEq (40 mEq Oral Given 05/11/24 2203)    ED Course/ Medical Decision Making/ A&P                          Medical Decision Making Amount and/or Complexity of Data Reviewed Labs: ordered. Radiology: ordered. Decision-making details  documented in ED Course.  Risk Prescription drug management. Decision regarding hospitalization.    This patient presents to the ED for concern of ***, this involves an extensive number of treatment options, and is a complaint that carries with it a high risk of complications and morbidity.  I considered the following differential and admission for this acute, potentially life threatening condition.   MDM:    Given the acute onset of neurological symptoms, stroke is the most concerning etiology of these acute symptoms. The neuro exam is significant for ***.   Neurology team evaluating patient at bedside. Plan to obtain emergent CT brain.  LKN: Last night prior to sleeping at 12:30 AM Glucose: *** AC: *** BP: ***   DDX for patient's acute hypoxic respiratory failure includes but is not limited to: Overall seems to have lower concern for anaphylaxis though patient did receive epinephrine  prior to arrival and so cannot rule out based on absence of rash or wheezing but her symptoms are improving though she is still hypoxic.  She does have a history of cancer and so hide concern for pulmonary embolism and D-dimer is elevated so will obtain CT PE.  She has no pulmonary edema on her chest x-ray or lower extremity edema that would indicate heart failure exacerbation.  No consolidative pneumonia on chest x-ray either.  Initially she was in respiratory distress with increased work of breathing and cyanotic fingertips but was placed on nebulizer and her work of breathing significantly improved.  Also consider her known COPD or COPD exacerbation, was already treated with Solu-Medrol  as well.   Clinical Course as of 05/17/24 1615  Fri May 11, 2024  1805 No  LVO or anything obvious on CTP. Dr. Michaela ordering STAT MRI. [HN]  1805 CT Angio Chest PE W and/or Wo Contrast No evidence of pulmonary embolus.  Stable lingular nodule, 1.4 cm.  Airway thickening with bibasilar atelectasis.  Coronary  artery disease.  Aortic Atherosclerosis (ICD10-I70.0).   [HN]    Clinical Course User Index [HN] Franklyn Sid SAILOR, MD    Labs: I Ordered, and personally interpreted labs.  The pertinent results include:  those listed above  Imaging Studies ordered: CXR, CT PE ordered by PA. I ordered imaging studies including CTA H&N, MRI brain wo contrast I independently visualized and interpreted imaging. I agree with the radiologist interpretation  Additional history obtained from chart review  Cardiac Monitoring: The patient was maintained on a cardiac monitor.  I personally viewed and interpreted the cardiac monitored which showed an underlying rhythm of: NSR  Reevaluation: After the interventions noted above, I reevaluated the patient and found that they have :stayed the same  Social Determinants of Health: Lives independently  Disposition:  admit to hospitalist with neuro following  Co morbidities that complicate the patient evaluation  Past Medical History:  Diagnosis Date   Alcohol abuse, in remission    Anxiety    Cancer of sigmoid colon (HCC) 07/2005   T2N0M0   Chronic bronchitis    COPD (chronic obstructive pulmonary disease) (HCC)    Depression    Diabetes mellitus without complication (HCC)    GERD (gastroesophageal reflux disease)    History of thyroid  nodule    monitoring   Hyperlipidemia    Hypertension    Migraines    OA (osteoarthritis)    PONV (postoperative nausea and vomiting)    Reflux      Medicines Meds ordered this encounter  Medications   famotidine  (PEPCID ) IVPB 20 mg premix   metoCLOPramide  (REGLAN ) injection 10 mg   albuterol  (PROVENTIL ) (2.5 MG/3ML) 0.083% nebulizer solution    Estell Sailors W: cabinet override   DISCONTD: albuterol  (PROVENTIL ) (2.5 MG/3ML) 0.083% nebulizer solution   magnesium  sulfate IVPB 2 g 50 mL   iohexol  (OMNIPAQUE ) 350 MG/ML injection 75 mL   iohexol  (OMNIPAQUE ) 350 MG/ML injection 100 mL   ARIPiprazole   (ABILIFY ) tablet 20 mg   rosuvastatin  (CRESTOR ) tablet 5 mg   gabapentin  (NEURONTIN ) capsule 100 mg    Patient is allergic to red dye #40 and brown dye.   amitriptyline  (ELAVIL ) tablet 100 mg    Patient is allergic to red dye #40 and brown dye.  She takes 4 tablets of 25 mg at home.    stroke: early stages of recovery book   0.9 %  sodium chloride  infusion   enoxaparin (LOVENOX) injection 40 mg   insulin  aspart (novoLOG ) injection 0-9 Units    Correction coverage::   Sensitive (thin, NPO, renal)    CBG < 70::   Implement Hypoglycemia Standing Orders and refer to Hypoglycemia Standing Orders sidebar report    CBG 70 - 120::   0 units    CBG 121 - 150::   1 unit    CBG 151 - 200::   2 units    CBG 201 - 250::   3 units    CBG 251 - 300::   5 units    CBG 301 - 350::   7 units    CBG 351 - 400:   9 units    CBG > 400:   call MD and obtain STAT lab verification   insulin  aspart (novoLOG ) injection 0-5  Units    Correction coverage::   HS scale    CBG < 70::   Implement Hypoglycemia Standing Orders and refer to Hypoglycemia Standing Orders sidebar report    CBG 70 - 120::   0 units    CBG 121 - 150::   0 units    CBG 151 - 200::   0 units    CBG 201 - 250::   2 units    CBG 251 - 300::   3 units    CBG 301 - 350::   4 units    CBG 351 - 400::   5 units    CBG > 400:   call MD and obtain STAT lab verification   clopidogrel (PLAVIX) tablet 300 mg   clopidogrel (PLAVIX) tablet 75 mg   potassium chloride  SA (KLOR-CON  M) CR tablet 40 mEq   DISCONTD: methylPREDNISolone  sodium succinate (SOLU-MEDROL ) 40 mg/mL injection 40 mg    IV methylprednisolone  will be converted to either a q12h or q24h frequency with the same total daily dose (TDD).  Ordered Dose: 1 to 125 mg TDD; convert to: TDD q24h.  Ordered Dose: 126 to 250 mg TDD; convert to: TDD div q12h.  Ordered Dose: >250 mg TDD; DAW.   DISCONTD: ipratropium-albuterol  (DUONEB) 0.5-2.5 (3) MG/3ML nebulizer solution 3 mL   budesonide  (PULMICORT) nebulizer solution 0.25 mg   DISCONTD: albuterol  (PROVENTIL ) (2.5 MG/3ML) 0.083% nebulizer solution 2.5 mg   acetaminophen  (TYLENOL ) tablet 650 mg   nicotine (NICODERM CQ - dosed in mg/24 hours) patch 14 mg   DISCONTD: famotidine  (PEPCID ) tablet 20 mg   famotidine  (PEPCID ) tablet 20 mg   glucagon (human recombinant) (GLUCAGEN) injection 1 mg   labetalol (NORMODYNE) injection 10 mg   ondansetron  (ZOFRAN ) injection 4 mg   ipratropium-albuterol  (DUONEB) 0.5-2.5 (3) MG/3ML nebulizer solution 3 mL   DISCONTD: ipratropium-albuterol  (DUONEB) 0.5-2.5 (3) MG/3ML nebulizer solution 3 mL   DISCONTD: insulin  glargine (LANTUS) injection 10 Units   DISCONTD: ipratropium-albuterol  (DUONEB) 0.5-2.5 (3) MG/3ML nebulizer solution 3 mL   ipratropium-albuterol  (DUONEB) 0.5-2.5 (3) MG/3ML nebulizer solution 3 mL   DISCONTD: insulin  glargine (LANTUS) injection 15 Units   insulin  aspart (novoLOG ) injection 3 Units   predniSONE (DELTASONE) tablet 40 mg   insulin  glargine-yfgn (SEMGLEE) injection 15 Units   ipratropium-albuterol  (DUONEB) 0.5-2.5 (3) MG/3ML SOLN    Sig: Take 3 mLs by nebulization every 4 (four) hours as needed.   nicotine (NICODERM CQ - DOSED IN MG/24 HOURS) 14 mg/24hr patch    Sig: Place 1 patch (14 mg total) onto the skin daily.    Dispense:  28 patch    Refill:  0   predniSONE (DELTASONE) 20 MG tablet    Sig: Prednisone 40 mg daily for 1 days followed by  Prednisone 20 mg daily for 3 days and stop.   insulin  aspart (NOVOLOG ) 100 UNIT/ML injection    Sig: CBG 70 - 120: 0 units  CBG 121 - 150: 0 units  CBG 151 - 200: 0 units  CBG 201 - 250: 2 units  CBG 251 - 300: 3 units  CBG 301 - 350: 4 units  CBG 351 - 400: 5 units   clopidogrel (PLAVIX) 75 MG tablet    Sig: Take 1 tablet (75 mg total) by mouth daily.    Dispense:  30 tablet    Refill:  3   budesonide (PULMICORT) 0.25 MG/2ML nebulizer solution    Sig: Take 2 mLs (0.25 mg total) by nebulization 2 (two) times  daily.     Dispense:  60 mL    Refill:  12   lidocaine -EPINEPHrine  (XYLOCAINE  W/EPI) 1 %-1:100000 (with pres) injection    Raynold, Adam K: cabinet override   DISCONTD: lidocaine -EPINEPHrine  (XYLOCAINE  W/EPI) 1 %-1:100000 (with pres) injection    I have reviewed the patients home medicines and have made adjustments as needed  Problem List / ED Course: Problem List Items Addressed This Visit       Cardiovascular and Mediastinum   Acute CVA (cerebrovascular accident) (HCC)   Relevant Medications   rosuvastatin  (CRESTOR ) tablet 5 mg   enoxaparin (LOVENOX) injection 40 mg   labetalol (NORMODYNE) injection 10 mg   Other Relevant Orders   Ambulatory referral to Neurology     Genitourinary   AKI (acute kidney injury) - Primary     Other   Acute left-sided weakness   Other Visit Diagnoses       Acute respiratory failure with hypoxia (HCC)         Left-sided neglect                {Document critical care time when appropriate:1} {Document review of labs and clinical decision tools ie heart score, Chads2Vasc2 etc:1}  {Document your independent review of radiology images, and any outside records:1} {Document your discussion with family members, caretakers, and with consultants:1} {Document social determinants of health affecting pt's care:1} {Document your decision making why or why not admission, treatments were needed:1}  This note was created using dictation software, which may contain spelling or grammatical errors.

## 2024-05-17 NOTE — H&P (Signed)
 Physical Medicine and Rehabilitation Admission H&P        Chief Complaint  Patient presents with   Functional deficits due to CVA  : HPI: [name] is a 73 year old female with past medical history of tobacco abuse, squamous cell carcinoma of the lung (newly diagnosed 04/2024), COPD, coronary artery calcification, depression, type 2 diabetes, hypertension, migraines who presented to [facility] emergency department on 05/11/2024, with a sudden onset of shortness of breath. The patient had previously used BC powder for a headache and believed she was experiencing an allergic reaction. EMS administered Solu-Medrol , two rounds of epinephrine , and Zofran . In the emergency department, she was tachypneic but maintained oxygen  saturation at 96% on six liters of oxygen . A code stroke was activated due to left neglect and hemiparesis. She was not eligible for TNK treatment as she was outside the treatment window. ED labs showed a D-dimer of 7.84, ABG with a pH of 7.31, pCO2 of 49, and pO2 of 88. A CT angiogram was negative for pulmonary embolism, and a CT head was also negative. CTA of the head and neck indicated a large vessel occlusion, but CT perfusion showed no evidence of ischemia.  Neurology was consulted and a stroke workup was completed. An MRI confirmed infarcts in the right MCA, PCA, and ACA territories. Recommendations were made for a loop recorder to rule out a. Fib, she was placed on daily Plavix, as she is allergic to aspirin . The MRI did not reveal any brain metastases. The echo showed an ejection fraction of 65%, and her HbA1c was 7.4. Her hospital course was complicated by acute kidney injury, but she received IV fluid hydration and had mild hypokalemia, which was treated with potassium replacement. Prior to arrival, the patient was independent, driving, and did not use mobility devices. She resides in a two-level home with her significant other and currently requires moderate assistance with a  roller walker for functional mobility and transfers. Therapy evaluations completed due to patient decreased functional mobility was admitted for a comprehensive rehab program.            ROS:  - CONSTITUTIONAL: Denies weight loss, fever and chills.  - HEENT: Denies changes in vision and hearing.  - RESPIRATORY: Denies SOB and cough.  - CV: Denies palpitations and CP.  - GI: Denies abdominal pain, nausea, vomiting and diarrhea.  - GU: Denies dysuria and urinary frequency.  - MSK: Denies myalgia and joint pain.  - SKIN: Denies rash and pruritus.  - NEUROLOGICAL: Denies headache and syncope.  - PSYCHIATRIC: Denies recent changes in mood. Denies anxiety and depression.       Past Medical History:  Diagnosis Date   Alcohol abuse, in remission     Anxiety     Cancer of sigmoid colon (HCC) 07/2005    T2N0M0   Chronic bronchitis     COPD (chronic obstructive pulmonary disease) (HCC)     Depression     Diabetes mellitus without complication (HCC)     GERD (gastroesophageal reflux disease)     History of thyroid  nodule      monitoring   Hyperlipidemia     Hypertension     Migraines     OA (osteoarthritis)     PONV (postoperative nausea and vomiting)     Reflux               Past Surgical History:  Procedure Laterality Date   ABDOMINAL EXPOSURE N/A 11/24/2016    Procedure: ABDOMINAL EXPOSURE;  Surgeon: Krystal JULIANNA Doing, MD;  Location: [facility] OR;  Service: Vascular;  Laterality: N/A;   ABDOMINAL HYSTERECTOMY   1992    age 41 and appendics   ANTERIOR LUMBAR FUSION Bilateral 11/24/2016    Procedure: LUMBAR 5-SACRUM 1 ANTERIOR LUMBAR INTERBODY FUSION;  Surgeon: Oneil Priestly, MD;  Location: [facility] OR;  Service: Orthopedics;  Laterality: Bilateral;  LUMBAR 5-SACRUM 1 ANTERIOR LUMBAR INTERBODY FUSION    ARTHRODESIS METATARSALPHALANGEAL JOINT (MTPJ) Left 08/15/2023    Procedure: ARTHRODESIS METATARSALPHALANGEAL JOINT (MTPJ);  Surgeon: Tobie Franky SQUIBB, DPM;  Location: [facility] ORS;  Service:  Orthopedics/Podiatry;  Laterality: Left;  POPLITEAL/SAPHENOUS BLOCK   bilateral breast surgery         benign tumors removed   CHOLECYSTECTOMY   2007   COLON RESECTION   2006   COLONOSCOPY   2019   HAND SURGERY   2010   repair of incisional hernia   2012   SHOULDER ARTHROSCOPY Left 2009   SHOULDER SURGERY Right 1997    rotator cuff   TONSILLECTOMY        as a child   TOTAL KNEE ARTHROPLASTY Left 08/11/2021    Procedure: TOTAL KNEE ARTHROPLASTY;  Surgeon: Beverley Evalene BIRCH, MD;  Location: [facility] ORS;  Service: Orthopedics;  Laterality: Left;   TOTAL KNEE ARTHROPLASTY Right 06/29/2022    Procedure: TOTAL KNEE ARTHROPLASTY;  Surgeon: Beverley Evalene BIRCH, MD;  Location: [facility] ORS;  Service: Orthopedics;  Laterality: Right;   VIDEO BRONCHOSCOPY WITH ENDOBRONCHIAL NAVIGATION N/A 04/23/2024    Procedure: VIDEO BRONCHOSCOPY WITH ENDOBRONCHIAL NAVIGATION;  Surgeon: Shelah Lamar RAMAN, MD;  Location: [facility] ENDOSCOPY;  Service: Pulmonary;  Laterality: N/A;  lingular nodule             Family History  Problem Relation Age of Onset   Heart disease Mother          Previous MI at age 66-55   Atrial fibrillation Mother     Stroke Mother     Heart failure Mother     Seizures Father          poss d/t alcohol   Cancer Father 76        Prostate cancer    Diabetes Sister     Arrhythmia Sister     Cancer Maternal Aunt          lung cancer    Cancer Maternal Uncle          kidney ca        Social History:  reports that she has been smoking cigarettes and e-cigarettes. She started smoking about 54 years ago. She has a 41.1 pack-year smoking history. She has never used smokeless tobacco. She reports that she does not drink alcohol and does not use drugs. Allergies:  Allergies       Allergies  Allergen Reactions   Bc Cherry [Aspirin -Caffeine] Anaphylaxis   Benadryl  [Diphenhydramine  Hcl] Nausea And Vomiting and Other (See Comments)      Migraine headaches.   Lansoprazole Nausea And Vomiting and Other (See  Comments)      Migraine headache   Prevacid* due to dye in med    Morphine Sulfate Nausea And Vomiting and Other (See Comments)      Migraine headaches.   Red Dye #40 (Allura Red) Other (See Comments)      Also allergic to [facility] Dye-migraine headaches.   Simvastatin Nausea And Vomiting and Other (See Comments)      Migraine     Cephalexin  Other Reaction(s): severe fatigue   Macrolides And Ketolides Nausea And Vomiting   Other        Brown dye,medicine with brown dye   Codeine Nausea And Vomiting   Penicillins Nausea And Vomiting and Other (See Comments)      INTOLERANCE >  NAUSEA & VOMITING YEAST INFECTION > CHANGE IN NORMAL FLORA   Shellfish Allergy Other (See Comments)      Congestion   Other Reaction(s): migraine   Sulfa Antibiotics Nausea And Vomiting            Medications Prior to Admission  Medication Sig Dispense Refill   albuterol  (VENTOLIN  HFA) 108 (90 Base) MCG/ACT inhaler Inhale 1-2 puffs into the lungs every 4 (four) hours as needed for wheezing or shortness of breath.       amitriptyline  (ELAVIL ) 25 MG tablet Take 100 mg by mouth at bedtime.       ARIPiprazole  (ABILIFY ) 20 MG tablet Take 20 mg by mouth daily.       calcium  carbonate (TUMS - DOSED IN MG ELEMENTAL CALCIUM ) 500 MG chewable tablet Chew 2 tablets by mouth 4 (four) times daily as needed for indigestion or heartburn.       Cholecalciferol  1.25 MG (50000 UT) capsule Take 50,000 Units by mouth every 7 (seven) days.       famotidine  (PEPCID ) 20 MG tablet Take 20 mg by mouth at bedtime.       gabapentin  (NEURONTIN ) 100 MG capsule Take 100 mg by mouth 2 (two) times daily.       metFORMIN  (GLUCOPHAGE -XR) 500 MG 24 hr tablet Take 1,000 mg by mouth 2 (two) times daily.       mupirocin ointment (BACTROBAN) 2 % Apply 1 Application topically daily as needed (Blisters).       rosuvastatin  (CRESTOR ) 5 MG tablet Take 5 mg by mouth daily.       valsartan-hydrochlorothiazide (DIOVAN-HCT) 320-12.5 MG tablet Take  1 tablet by mouth daily.                  Home: Home Living Family/patient expects to be discharged to:: Private residence Living Arrangements: Spouse/significant other Available Help at Discharge: Other (Comment) Type of Home: House Home Access: Stairs to enter [employer] of Steps: 1 Entrance Stairs-Rails: None Home Layout: Two level Alternate Level Stairs-Number of Steps: flight Alternate Level Stairs-Rails: Left, Right Bathroom Shower/Tub: Health visitor: Handicapped height Bathroom Accessibility: Yes Home Equipment: Grab bars - toilet, Grab bars - tub/shower, Shower seat - built in, Dorothy - single point, [employer] - quad, Agricultural consultant (2 wheels) Additional Comments: lives with significant other, walk in shower downstairs on main level, has bathtub upstairs  Lives With: Significant other   Functional History: Prior Function Prior Level of Function : Independent/Modified Independent Mobility Comments: not using AD, recent fall 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   Functional Status:  Mobility: Bed Mobility Overal bed mobility: Needs Assistance Bed Mobility: Supine to Sit Supine to sit: Min assist Sit to supine: Min assist, Used rails General bed mobility comments: assistance with scooting towards EOB Transfers Overall transfer level: Needs assistance Equipment used: Rolling walker (2 wheels) Transfers: Sit to/from Stand, Bed to chair/wheelchair/BSC Sit to Stand: Min assist Bed to/from chair/wheelchair/BSC transfer type:: Step pivot Step pivot transfers: Mod assist General transfer comment: patient able to stand from EOB with min assist and during transfers patient had difficulty progress [name] and required assistance for balance Ambulation/Gait Ambulation/Gait assistance: Mod  assist Gait Distance (Feet): 20 Feet (x20, x10, x10) Assistive device: Rolling walker (2 wheels) Gait Pattern/deviations: Step-to pattern,  Decreased stride length, Decreased weight shift to right, Shuffle, Ataxic, Scissoring, Staggering left, Drifts right/left, Trunk flexed, Narrow base of support General Gait Details: occasional left lateral and posterior lean requiring ModA to correct. Ataxic gait pattern with decreased [name] proprioception. Cues to widen BOS, for RW proximity, and to lead with L LE. Gait velocity: dec Gait velocity interpretation: <1.31 ft/sec, indicative of household ambulator Pre-gait activities: Engineer, water march   ADL: ADL Overall ADL's : Needs assistance/impaired Eating/Feeding: Set up, Sitting Grooming: Set up, Sitting Upper Body Bathing: Minimal assistance, Moderate assistance, Sitting Lower Body Bathing: Maximal assistance, Sitting/lateral leans Upper Body Dressing : Minimal assistance, Sitting Upper Body Dressing Details (indicate cue type and reason): gown change Lower Body Dressing: Maximal assistance, Sitting/lateral leans, Sit to/from stand Lower Body Dressing Details (indicate cue type and reason): donning shoes Toilet Transfer: Moderate assistance, Rolling walker (2 wheels) Toilet Transfer Details (indicate cue type and reason): simulated to recliner Toileting- Clothing Manipulation and Hygiene: Minimal assistance, Moderate assistance, Sitting/lateral lean Functional mobility during ADLs: Moderate assistance, Rolling walker (2 wheels) General ADL Comments: patient often dropping items when using [name]   Cognition: Cognition Overall Cognitive Status: Within Functional Limits for tasks assessed Arousal/Alertness: Awake/alert Orientation Level: Oriented X4 Attention: Sustained Sustained Attention: Appears intact Memory: Impaired Memory Impairment: Decreased recall of new information (66% accurate) Awareness: Appears intact Problem Solving: Appears intact Cognition Arousal: Alert Behavior During Therapy: WFL for tasks assessed/performed Overall Cognitive Status: Within Functional Limits for  tasks assessed   Physical Exam: Blood pressure (!) 151/77, pulse (!) 51, temperature 97.8 F (36.6 C), resp. rate 18, height 5' 4 (1.626 m), SpO2 98%. Physical Exam  Constitutional: No apparent distress. Appropriate appearance for age.  HENT: No JVD. Neck Supple. Trachea midline. Atraumatic, normocephalic. Eyes: PERRLA. EOMI. Visual fields grossly intact.  Cardiovascular: RRR, no murmurs/rub/gallops. No Edema. Peripheral pulses 2+  Respiratory: CTAB. No rales, rhonchi, or wheezing. On RA.  Abdomen: + bowel sounds, normoactive. No distention or tenderness.  Skin: C/D/I. No apparent lesions. MSK:      No apparent deformity. + Bilateral TKRs, well-healed       Neurologic exam:  Cognition: AAO to person, place, time and event.  Language: Fluent, No substitutions or neoglisms. No dysarthria. Names 3/3 objects correctly.  Memory: Recalls 3/3 objects at 5 minutes. No apparent deficits  Insight: Good  insight into current condition.  Mood: Pleasant affect, appropriate mood.  Sensation: To light touch slightly reduced in left upper and lower extremity Reflexes: 2+ in BL UE and LEs. Negative Hoffman's and babinski signs bilaterally.  CN: 2-12 grossly intact.  Coordination: No apparent tremors.  Mild left upper extremity ataxia Spasticity: MAS 0 in all extremities.  Strength: 5 out of 5 throughout right upper and lower extremity 4- out of 5 throughout left upper and lower extremity    Lab Results Last 48 Hours        Results for orders placed or performed during the hospital encounter of 05/11/24 (from the past 48 hours)  Urinalysis, Routine w reflex microscopic -Urine, Clean Catch     Status: Abnormal    Collection Time: 05/15/24  4:13 PM  Result Value Ref Range    Color, Urine YELLOW YELLOW    APPearance CLEAR CLEAR    Specific Gravity, Urine 1.015 1.005 - 1.030    pH 6.0 5.0 - 8.0    Glucose, UA >=500 (  A) NEGATIVE mg/dL    Hgb urine dipstick NEGATIVE NEGATIVE    Bilirubin Urine  NEGATIVE NEGATIVE    Ketones, ur NEGATIVE NEGATIVE mg/dL    Protein, ur NEGATIVE NEGATIVE mg/dL    Nitrite NEGATIVE NEGATIVE    Leukocytes,Ua NEGATIVE NEGATIVE    RBC / HPF 0-5 0 - 5 RBC/hpf    WBC, UA 0-5 0 - 5 WBC/hpf    Bacteria, UA RARE (A) NONE SEEN    Squamous Epithelial / HPF 0-5 0 - 5 /HPF      Comment: Performed at [facility], [address], [place], [place] [address]  Glucose, capillary     Status: Abnormal    Collection Time: 05/15/24  5:01 PM  Result Value Ref Range    Glucose-Capillary 338 (H) 70 - 99 mg/dL      Comment: Glucose reference range applies only to samples taken after fasting for at least 8 hours.  Glucose, capillary     Status: Abnormal    Collection Time: 05/15/24  9:04 PM  Result Value Ref Range    Glucose-Capillary 174 (H) 70 - 99 mg/dL      Comment: Glucose reference range applies only to samples taken after fasting for at least 8 hours.    Comment 1 Notify RN      Comment 2 Document in Chart    Basic metabolic panel with GFR     Status: Abnormal    Collection Time: 05/16/24  3:12 AM  Result Value Ref Range    Sodium 137 135 - 145 mmol/L    Potassium 3.8 3.5 - 5.1 mmol/L    Chloride 101 98 - 111 mmol/L    CO2 26 22 - 32 mmol/L    Glucose, Bld 142 (H) 70 - 99 mg/dL      Comment: Glucose reference range applies only to samples taken after fasting for at least 8 hours.    BUN 31 (H) 8 - 23 mg/dL    Creatinine, Ser 9.03 0.44 - 1.00 mg/dL    Calcium  8.9 8.9 - 10.3 mg/dL    GFR, Estimated >39 >39 mL/min      Comment: (NOTE) Calculated using the CKD-EPI Creatinine Equation (2021)      Anion gap 10 5 - 15      Comment: Performed at [facility], [address] [place], [place] [address]  CBC     Status: Abnormal    Collection Time: 05/16/24  3:12 AM  Result Value Ref Range    WBC 13.3 (H) 4.0 - 10.5 K/uL    RBC 3.87 3.87 - 5.11 MIL/uL    Hemoglobin 12.3 12.0 - 15.0 g/dL    HCT 63.4 63.9 - 53.9 %    MCV 94.3 80.0 - 100.0 fL    MCH  31.8 26.0 - 34.0 pg    MCHC 33.7 30.0 - 36.0 g/dL    RDW 87.7 88.4 - 84.4 %    Platelets 277 150 - 400 K/uL    nRBC 0.2 0.0 - 0.2 %      Comment: Performed at [facility], [address] [place], [place] [address]  Magnesium      Status: None    Collection Time: 05/16/24  3:12 AM  Result Value Ref Range    Magnesium  1.8 1.7 - 2.4 mg/dL      Comment: Performed at [facility], [address] [place], [place] [address]  Glucose, capillary  Status: Abnormal    Collection Time: 05/16/24  6:11 AM  Result Value Ref Range    Glucose-Capillary 134 (H) 70 - 99 mg/dL      Comment: Glucose reference range applies only to samples taken after fasting for at least 8 hours.  Glucose, capillary     Status: Abnormal    Collection Time: 05/16/24 12:08 PM  Result Value Ref Range    Glucose-Capillary 168 (H) 70 - 99 mg/dL      Comment: Glucose reference range applies only to samples taken after fasting for at least 8 hours.    Comment 1 Notify RN    Glucose, capillary     Status: Abnormal    Collection Time: 05/16/24  3:22 PM  Result Value Ref Range    Glucose-Capillary 331 (H) 70 - 99 mg/dL      Comment: Glucose reference range applies only to samples taken after fasting for at least 8 hours.    Comment 1 Notify RN    Glucose, capillary     Status: Abnormal    Collection Time: 05/16/24  9:15 PM  Result Value Ref Range    Glucose-Capillary 128 (H) 70 - 99 mg/dL      Comment: Glucose reference range applies only to samples taken after fasting for at least 8 hours.    Comment 1 Notify RN    Basic metabolic panel with GFR     Status: Abnormal    Collection Time: 05/17/24  1:58 AM  Result Value Ref Range    Sodium 136 135 - 145 mmol/L    Potassium 3.6 3.5 - 5.1 mmol/L    Chloride 98 98 - 111 mmol/L    CO2 23 22 - 32 mmol/L    Glucose, Bld 148 (H) 70 - 99 mg/dL      Comment: Glucose reference range applies only to samples taken after fasting for at least 8 hours.    BUN 30 (H) 8 -  23 mg/dL    Creatinine, Ser 9.10 0.44 - 1.00 mg/dL    Calcium  8.8 (L) 8.9 - 10.3 mg/dL    GFR, Estimated >39 >39 mL/min      Comment: (NOTE) Calculated using the CKD-EPI Creatinine Equation (2021)      Anion gap 15 5 - 15      Comment: Performed at [facility] Lab, [address] [place], [place] [address]  CBC     Status: Abnormal    Collection Time: 05/17/24  1:58 AM  Result Value Ref Range    WBC 14.1 (H) 4.0 - 10.5 K/uL    RBC 4.11 3.87 - 5.11 MIL/uL    Hemoglobin 13.1 12.0 - 15.0 g/dL    HCT 61.2 63.9 - 53.9 %    MCV 94.2 80.0 - 100.0 fL    MCH 31.9 26.0 - 34.0 pg    MCHC 33.9 30.0 - 36.0 g/dL    RDW 87.7 88.4 - 84.4 %    Platelets 313 150 - 400 K/uL    nRBC 0.2 0.0 - 0.2 %      Comment: Performed at [facility], [address], [place], [place] [address]  Magnesium      Status: None    Collection Time: 05/17/24  1:58 AM  Result Value Ref Range    Magnesium  1.9 1.7 - 2.4 mg/dL      Comment: Performed at [facility] Lab, [address] [place], [place] [address]  Glucose, capillary  Status: Abnormal    Collection Time: 05/17/24  6:16 AM  Result Value Ref Range    Glucose-Capillary 123 (H) 70 - 99 mg/dL      Comment: Glucose reference range applies only to samples taken after fasting for at least 8 hours.    Comment 1 Notify RN    Glucose, capillary     Status: Abnormal    Collection Time: 05/17/24  8:02 AM  Result Value Ref Range    Glucose-Capillary 112 (H) 70 - 99 mg/dL      Comment: Glucose reference range applies only to samples taken after fasting for at least 8 hours.  Glucose, capillary     Status: Abnormal    Collection Time: 05/17/24 12:11 PM  Result Value Ref Range    Glucose-Capillary 187 (H) 70 - 99 mg/dL      Comment: Glucose reference range applies only to samples taken after fasting for at least 8 hours.      Imaging Results (Last 48 hours)  No results found.         Blood pressure (!) 151/77, pulse (!) 51, temperature 97.8 F  (36.6 C), resp. rate 18, height 5' 4 (1.626 m), SpO2 98%.   Medical Problem List and Plan: 1. Functional deficits secondary to right MCA, ACA and MCA/PCA infarcts embolic pattern secondary to embolic source              -patient may  shower             -ELOS/Goals: 8 to 13 days, supervision PT, OT   - Stable to admit to inpatient rehab after loop recorder placement today   2.  Antithrombotics: -DVT/anticoagulation:  Pharmaceutical: Lovenox             -antiplatelet therapy: Plavix (Asprin allergy)   3. Pain Management: Hx chronic pain-Gabapentin  100 mg twice daily.  Tylenol  prn   4. Mood/Behavior/Sleep: LCSW to follow for evaluation and support when available.              -antipsychotic agents: Amitriptyline  100 mg nightly, Abilify  20 mg daily   5. Neuropsych/cognition: This patient IS capable of making decisions on HER own behalf.   6. Skin/Wound Care: Routine pressure relief measures.   7. Fluids/Electrolytes/Nutrition: Monitor I&O, weights.  Labs in a.m.             -GERD: Pepcid     8. Acute multifocal focal infarct: Loop recorder to be placed by EP prior to CIR             - Continue Plavix and statin   9. COPD exacerbation:  Pulmicort and DuoNeb as scheduled. Confirmed with medicine *Prednisone 40 mg until 10/17 followed by 3 days of prednisone at 20 mg daily.             - DuoNeb as needed   10.  Lung CA: Recently diagnosed with lung cancer stage IA.  MRI brain no mets.  Follow-up with oncology   11. AKI: Creatinine peaked to 1.2, now at baseline 0.8.  Monitor CMP.   12. Leukocytosis: Suspect reactive secondary to steroids use.   -UA pending   13. HTN: Stable, no meds.  Long-term BP goal normotensive   14.  HLD: LDL 23 on Crestor  5 mg   15.  T2DM:  A1c 7.4.  Continue CBG monitoring AC/HS with SSI.              - NovoLog  SSI, Semglee 15 units daily  16.  Tobacco abuse: Smoking cessation also provided.   -Nicotine patch     Daphne LOISE Satterfield,  NP 05/17/2024  I have examined the patient independently and edited the note for HPI, ROS, exam, assessment, and plan as appropriate. I am in agreement with the above recommendations.   Joesph JAYSON Likes, DO 05/17/2024

## 2024-05-17 NOTE — TOC Transition Note (Signed)
 Transition of Care [facility]) - Discharge Note   Patient Details  Name: [name] MRN: [id number] Date of Birth: 11-04-1950  [facility] CM/SW Contact:  Andrez JULIANNA George, RN Phone Number: 05/17/2024, 9:53 AM   Clinical Narrative:     Pt is discharging to CIR today. No further needs per IP Care management.   Final next level of care: IP Rehab Facility Barriers to Discharge: No Barriers Identified   Patient Goals and CMS Choice   CMS Medicare.gov Compare Post Acute Care list provided to:: Patient Choice offered to / list presented to : Patient      Discharge Placement                       Discharge Plan and Services Additional resources added to the After Visit Summary for                                       Social Drivers of Health (SDOH) Interventions SDOH Screenings   Food Insecurity: No Food Insecurity (05/12/2024)  Housing: Low Risk  (05/16/2024)  Transportation Needs: No Transportation Needs (05/12/2024)  Utilities: Not At Risk (05/12/2024)  Social Connections: Moderately Isolated (05/16/2024)  Tobacco Use: High Risk (05/11/2024)     Readmission Risk Interventions     No data to display

## 2024-05-17 NOTE — Progress Notes (Signed)
 Inpatient Rehab Admissions Coordinator:    I have insurance approval and a bed available for pt to admit to CIR today. Dr. Jerri in agreement and [facility] aware.  I will notify pt and make arrangements for admit after loop recorder placed this afternoon.    Reche Lowers, PT, DPT Admissions Coordinator [phone] 05/17/24  9:52 AM

## 2024-05-17 NOTE — Progress Notes (Signed)
 Physical Therapy Treatment Patient Details Name: [name] MRN: [id number] DOB: 02-Feb-1951 Today's Date: 05/17/2024   History of Present Illness 73 year old presented 10/10 with shortness of breath possible allergic reaction to [facility] powder. Noted to have hypoxia and facial edema. Also noted to have left-sided neglect with weakness. Code stroke was activated and eventually workup was consistent with multifocal infarct, likely embolic source. PMH: SCC of the lung, COPD with ongoing tobacco use, HLD, depression, DM2, HTN    PT Comments  Pt received in supine and agreeable to session. Pt demonstrates improved static sitting balance EOB and with RW support during ambulation. Pt requires up to min A during ambulation with increased cues for technique. Pt able to tolerate increased distance overall and demonstrates improved step-through pattern during second gait trial. Pt demonstrates increased instability with multidirectional stepping, turns, and navigating obstacles. Pt continues to benefit from PT services to progress toward functional mobility goals.    If plan is discharge home, recommend the following: A lot of help with walking and/or transfers;A little help with bathing/dressing/bathroom;Assistance with cooking/housework;Assist for transportation;Help with stairs or ramp for entrance   Can travel by private vehicle        Equipment Recommendations  None recommended by PT    Recommendations for Other Services       Precautions / Restrictions Precautions Precautions: Fall Recall of Precautions/Restrictions: Intact Restrictions Weight Bearing Restrictions Per Provider Order: No     Mobility  Bed Mobility Overal bed mobility: Needs Assistance Bed Mobility: Supine to Sit     Supine to sit: Contact guard     General bed mobility comments: increased time and effort    Transfers Overall transfer level: Needs assistance Equipment used: Rolling walker (2 wheels) Transfers:  Sit to/from Stand Sit to Stand: Min assist           General transfer comment: From low EOB with min A for power up and cues for hand placement    Ambulation/Gait Ambulation/Gait assistance: Min assist, Contact guard assist Gait Distance (Feet): 70 Feet (+35) Assistive device: Rolling walker (2 wheels) Gait Pattern/deviations: Step-to pattern, Decreased stride length, Decreased weight shift to right, Ataxic, Drifts right/left, Trunk flexed, Narrow base of support, Step-through pattern Gait velocity: dec     General Gait Details: Pt initially demonstrating step-to pattern with low L foot clearance. Pt tends to look down due to impaired [name] proprioception. Pt able to demonstrate improved fluidity and step-through pattern during second trial with cues. cues for increased [name] clearance and heel strike, RW management and not to step past front bar   Stairs             Wheelchair Mobility     Tilt Bed    Modified Rankin (Stroke Patients Only) Modified Rankin (Stroke Patients Only) Pre-Morbid Rankin Score: No symptoms Modified Rankin: Moderately severe disability     Balance Overall balance assessment: Needs assistance Sitting-balance support: No upper extremity supported, Feet supported Sitting balance-Leahy Scale: Poor Sitting balance - Comments: CGA sitting EOB   Standing balance support: Bilateral upper extremity supported, During functional activity, Reliant on assistive device for balance Standing balance-Leahy Scale: Poor Standing balance comment: reliant on BUE support when standing                            Communication Communication Communication: No apparent difficulties  Cognition Arousal: Alert Behavior During Therapy: WFL for tasks assessed/performed   PT - Cognitive impairments: No apparent  impairments                         Following commands: Intact      Cueing Cueing Techniques: Verbal cues  Exercises      General  Comments        Pertinent Vitals/Pain Pain Assessment Pain Assessment: No/denies pain     PT Goals (current goals can now be found in the care plan section) Acute Rehab PT Goals Patient Stated Goal: Get stronger return home PT Goal Formulation: With patient Time For Goal Achievement: 05/27/24 Progress towards PT goals: Progressing toward goals    Frequency    Min 3X/week       AM-PAC PT 6 Clicks Mobility   Outcome Measure  Help needed turning from your back to your side while in a flat bed without using bedrails?: A Little Help needed moving from lying on your back to sitting on the side of a flat bed without using bedrails?: A Little Help needed moving to and from a bed to a chair (including a wheelchair)?: A Little Help needed standing up from a chair using your arms (e.g., wheelchair or bedside chair)?: A Little Help needed to walk in hospital room?: A Little Help needed climbing 3-5 steps with a railing? : Total 6 Click Score: 16    End of Session Equipment Utilized During Treatment: Gait belt Activity Tolerance: Patient tolerated treatment well Patient left: in chair;with call bell/phone within reach;with chair alarm set Nurse Communication: Mobility status PT Visit Diagnosis: Unsteadiness on feet (R26.81);Other abnormalities of gait and mobility (R26.89);History of falling (Z91.81);Ataxic gait (R26.0);Hemiplegia and hemiparesis Hemiplegia - Right/Left: Left Hemiplegia - dominant/non-dominant: Non-dominant     Time: 8882-8858 PT Time Calculation (min) (ACUTE ONLY): 24 min  Charges:    $Gait Training: 8-22 mins $Therapeutic Activity: 8-22 mins PT General Charges $$ ACUTE PT VISIT: 1 Visit                     Darryle George, PTA Acute Rehabilitation Services Secure Chat Preferred  [phone]    Darryle George 05/17/2024, 1:13 PM

## 2024-05-18 ENCOUNTER — Encounter (HOSPITAL_COMMUNITY): Payer: Self-pay | Admitting: Cardiology

## 2024-05-18 ENCOUNTER — Other Ambulatory Visit: Payer: Self-pay | Admitting: Radiation Oncology

## 2024-05-18 DIAGNOSIS — I63411 Cerebral infarction due to embolism of right middle cerebral artery: Secondary | ICD-10-CM | POA: Diagnosis not present

## 2024-05-18 DIAGNOSIS — J449 Chronic obstructive pulmonary disease, unspecified: Secondary | ICD-10-CM | POA: Diagnosis not present

## 2024-05-18 DIAGNOSIS — D72829 Elevated white blood cell count, unspecified: Secondary | ICD-10-CM | POA: Diagnosis not present

## 2024-05-18 DIAGNOSIS — C3490 Malignant neoplasm of unspecified part of unspecified bronchus or lung: Secondary | ICD-10-CM

## 2024-05-18 LAB — CBC WITH DIFFERENTIAL/PLATELET
Abs Immature Granulocytes: 0.4 K/uL — ABNORMAL HIGH (ref 0.00–0.07)
Basophils Absolute: 0.1 K/uL (ref 0.0–0.1)
Basophils Relative: 1 %
Eosinophils Absolute: 0.2 K/uL (ref 0.0–0.5)
Eosinophils Relative: 1 %
HCT: 40.2 % (ref 36.0–46.0)
Hemoglobin: 13.9 g/dL (ref 12.0–15.0)
Immature Granulocytes: 3 %
Lymphocytes Relative: 29 %
Lymphs Abs: 4.4 K/uL — ABNORMAL HIGH (ref 0.7–4.0)
MCH: 32.5 pg (ref 26.0–34.0)
MCHC: 34.6 g/dL (ref 30.0–36.0)
MCV: 93.9 fL (ref 80.0–100.0)
Monocytes Absolute: 1.2 K/uL — ABNORMAL HIGH (ref 0.1–1.0)
Monocytes Relative: 8 %
Neutro Abs: 9 K/uL — ABNORMAL HIGH (ref 1.7–7.7)
Neutrophils Relative %: 58 %
Platelets: 332 K/uL (ref 150–400)
RBC: 4.28 MIL/uL (ref 3.87–5.11)
RDW: 12.5 % (ref 11.5–15.5)
WBC: 15.2 K/uL — ABNORMAL HIGH (ref 4.0–10.5)
nRBC: 0 % (ref 0.0–0.2)

## 2024-05-18 LAB — COMPREHENSIVE METABOLIC PANEL WITH GFR
ALT: 18 U/L (ref 0–44)
AST: 23 U/L (ref 15–41)
Albumin: 2.9 g/dL — ABNORMAL LOW (ref 3.5–5.0)
Alkaline Phosphatase: 55 U/L (ref 38–126)
Anion gap: 11 (ref 5–15)
BUN: 26 mg/dL — ABNORMAL HIGH (ref 8–23)
CO2: 26 mmol/L (ref 22–32)
Calcium: 8.7 mg/dL — ABNORMAL LOW (ref 8.9–10.3)
Chloride: 101 mmol/L (ref 98–111)
Creatinine, Ser: 0.83 mg/dL (ref 0.44–1.00)
GFR, Estimated: >60 mL/min (ref >60)
Glucose, Bld: 103 mg/dL — ABNORMAL HIGH (ref 70–99)
Potassium: 3.9 mmol/L (ref 3.5–5.1)
Sodium: 138 mmol/L (ref 135–145)
Total Bilirubin: 0.6 mg/dL (ref 0.0–1.2)
Total Protein: 5.7 g/dL — ABNORMAL LOW (ref 6.5–8.1)

## 2024-05-18 LAB — GLUCOSE, CAPILLARY
Glucose-Capillary: 116 mg/dL — ABNORMAL HIGH (ref 70–99)
Glucose-Capillary: 219 mg/dL — ABNORMAL HIGH (ref 70–99)
Glucose-Capillary: 192 mg/dL — ABNORMAL HIGH (ref 70–99)

## 2024-05-18 NOTE — Plan of Care (Signed)
  Problem: RH Grooming Goal: LTG Patient will perform grooming w/assist,cues/equip (OT) Description: LTG: Patient will perform grooming with assist, with/without cues using equipment (OT) Flowsheets (Taken 05/18/2024 1533) LTG: Pt will perform grooming with assistance level of: Independent with assistive device    Problem: RH Bathing Goal: LTG Patient will bathe all body parts with assist levels (OT) Description: LTG: Patient will bathe all body parts with assist levels (OT) Flowsheets (Taken 05/18/2024 1533) LTG: Pt will perform bathing with assistance level/cueing: Supervision/Verbal cueing   Problem: RH Dressing Goal: LTG Patient will perform upper body dressing (OT) Description: LTG Patient will perform upper body dressing with assist, with/without cues (OT). Flowsheets (Taken 05/18/2024 1533) LTG: Pt will perform upper body dressing with assistance level of: Independent with assistive device Goal: LTG Patient will perform lower body dressing w/assist (OT) Description: LTG: Patient will perform lower body dressing with assist, with/without cues in positioning using equipment (OT) Flowsheets (Taken 05/18/2024 1533) LTG: Pt will perform lower body dressing with assistance level of: Independent with assistive device   Problem: RH Toileting Goal: LTG Patient will perform toileting task (3/3 steps) with assistance level (OT) Description: LTG: Patient will perform toileting task (3/3 steps) with assistance level (OT)  Flowsheets (Taken 05/18/2024 1533) LTG: Pt will perform toileting task (3/3 steps) with assistance level: Independent with assistive device   Problem: RH Functional Use of Upper Extremity Goal: LTG Patient will use RT/LT upper extremity as a (OT) Description: LTG: Patient will use right/left upper extremity as a stabilizer/gross assist/diminished/nondominant/dominant level with assist, with/without cues during functional activity (OT) Flowsheets (Taken 05/18/2024  1533) LTG: Use of upper extremity in functional activities: [name] as nondominant level LTG: Pt will use upper extremity in functional activity with assistance level of: Supervision/Verbal cueing   Problem: RH Simple Meal Prep Goal: LTG Patient will perform simple meal prep w/assist (OT) Description: LTG: Patient will perform simple meal prep with assistance, with/without cues (OT). Flowsheets (Taken 05/18/2024 1533) LTG: Pt will perform simple meal prep with assistance level of: Supervision/Verbal cueing   Problem: RH Toilet Transfers Goal: LTG Patient will perform toilet transfers w/assist (OT) Description: LTG: Patient will perform toilet transfers with assist, with/without cues using equipment (OT) Flowsheets (Taken 05/18/2024 1533) LTG: Pt will perform toilet transfers with assistance level of: Independent with assistive device   Problem: RH Tub/Shower Transfers Goal: LTG Patient will perform tub/shower transfers w/assist (OT) Description: LTG: Patient will perform tub/shower transfers with assist, with/without cues using equipment (OT) Flowsheets (Taken 05/18/2024 1533) LTG: Pt will perform tub/shower stall transfers with assistance level of: Supervision/Verbal cueing   Problem: RH Awareness Goal: LTG: Patient will demonstrate awareness during functional activites type of (OT) Description: LTG: Patient will demonstrate awareness during functional activites type of (OT) Flowsheets (Taken 05/18/2024 1533) Patient will demonstrate awareness during functional activites type of: Anticipatory LTG: Patient will demonstrate awareness during functional activites type of (OT): Supervision

## 2024-05-18 NOTE — Progress Notes (Signed)
 Patient ID: [name], female   DOB: [birth year], 73 y.o.   MRN: [id number] Met with the patient to review current medical situation, rehab process, team conference and plan of care. Discussed secondary risk including SCC, smoking, HTN, HLD (LDL 23/Trig 166), DM (A1C 7.4) on Glucophage  PTA. Reviewed current medications and dietary modification recommendations for a HH/CMM diet. Reviewed smoking risks. Has a loop recorder in place. No other issues noted, continue to follow along to address educational needs to facilitate preparation for discharge. [name]

## 2024-05-18 NOTE — Progress Notes (Signed)
 Inpatient Rehabilitation Center Individual Statement of Services  Patient Name:  [name]  Date:  05/18/2024  Welcome to the Inpatient Rehabilitation Center.  Our goal is to provide you with an individualized program based on your diagnosis and situation, designed to meet your specific needs.  With this comprehensive rehabilitation program, you will be expected to participate in at least 3 hours of rehabilitation therapies Monday-Friday, with modified therapy programming on the weekends.  Your rehabilitation program will include the following services:  Physical Therapy (PT), Occupational Therapy (OT), Speech Therapy (ST), 24 hour per day rehabilitation nursing, Therapeutic Recreaction (TR), Neuropsychology, Care Coordinator, Rehabilitation Medicine, Nutrition Services, and Pharmacy Services  Weekly team conferences will be held on Wednesday to discuss your progress.  Your Inpatient Rehabilitation Care Coordinator will talk with you frequently to get your input and to update you on team discussions.  Team conferences with you and your family in attendance may also be held.  Expected length of stay: 7-10 days  Overall anticipated outcome: supervision-mod/I level  Depending on your progress and recovery, your program may change. Your Inpatient Rehabilitation Care Coordinator will coordinate services and will keep you informed of any changes. Your Inpatient Rehabilitation Care Coordinator's name and contact numbers are listed  below.  The following services may also be recommended but are not provided by the Inpatient Rehabilitation Center:  Driving Evaluations Home Health Rehabiltiation Services Outpatient Rehabilitation Services    Arrangements will be made to provide these services after discharge if needed.  Arrangements include referral to agencies that provide these services.  Your insurance has been verified to be:  [employer] Your primary doctor is:  [employer] manager  Pertinent  information will be shared with your doctor and your insurance company.  Inpatient Rehabilitation Care Coordinator:  Rhoda Clement, KEN [phone] or [name]  Information discussed with and copy given to patient by: [name], 05/18/2024, 10:55 AM

## 2024-05-18 NOTE — IPOC Note (Signed)
 Overall Plan of Care [facility]) Patient Details Name: [name] MRN: [id number] DOB: 1951-05-27  Admitting Diagnosis: CVA (cerebral vascular accident) [facility])  Hospital Problems: Principal Problem:   CVA (cerebral vascular accident) (HCC)     Functional Problem List: Nursing Safety, Endurance, Medication Management, Pain, Bowel  PT Balance, Endurance, Motor, Pain, Perception, Safety, Sensory  OT Balance, Sensory, Skin Integrity, Endurance, Motor, Pain, Perception, Safety  SLP    TR         Basic ADL's: OT Bathing, Dressing, Toileting     Advanced  ADL's: OT Simple Meal Preparation     Transfers: PT Bed Mobility, Bed to Chair, Car, [employer], Civil Service fast streamer, Research scientist (life sciences): PT Ambulation, Stairs     Additional Impairments: OT    SLP        TR      Anticipated Outcomes Item Anticipated Outcome  Self Feeding MOD I  Swallowing      Basic self-care  MOD I  Toileting  MOD I   Bathroom Transfers SUP  Bowel/Bladder  manage bowel w mod I assist  Transfers  supervision  Locomotion  supervision  Communication     Cognition     Pain  Pain < 4 with prns  Safety/Judgment  manage safety w cues   Therapy Plan: PT Intensity: Minimum of 1-2 x/day ,45 to 90 minutes PT Frequency: 5 out of 7 days PT Duration Estimated Length of Stay: 7-10 days OT Intensity: Minimum of 1-2 x/day, 45 to 90 minutes OT Frequency: 5 out of 7 days OT Duration/Estimated Length of Stay: 2 weeks     Team Interventions: Nursing Interventions Patient/Family Education, Pain Management, Discharge Planning, Medication Management, Disease Management/Prevention  PT interventions Ambulation/gait training, Community reintegration, DME/adaptive equipment instruction, Neuromuscular re-education, Psychosocial support, Stair training, UE/LE Strength taining/ROM, UE/LE Coordination activities, Therapeutic Activities, Pain management, Discharge planning, Warden/ranger,  Cognitive remediation/compensation, Disease management/prevention, Functional mobility training, Patient/family education, Splinting/orthotics, Therapeutic Exercise  OT Interventions Balance/vestibular training, Disease mangement/prevention, Neuromuscular re-education, Self Care/advanced ADL retraining, Therapeutic Exercise, UE/LE Strength taining/ROM, Skin care/wound managment, Pain management, DME/adaptive equipment instruction, Community reintegration, Functional electrical stimulation, Patient/family education, Splinting/orthotics, UE/LE Coordination activities, Discharge planning, Functional mobility training, Psychosocial support, Therapeutic Activities, Visual/perceptual remediation/compensation, Cognitive remediation/compensation  SLP Interventions    TR Interventions    SW/CM Interventions Discharge Planning, Psychosocial Support, Patient/Family Education   Barriers to Discharge MD  Medical stability  Nursing Home environment access/layout, Decreased caregiver support 2 level 1 ste bil rails w partner; lives with significant other, walk in shower downstairs on main level, has bathtub upstairs, and friend to assist prn  PT Decreased caregiver support, Home environment access/layout, Lack of/limited family support, Community education officer for SNF coverage    OT      SLP      SW Insurance for SNF coverage     Team Discharge Planning: Destination: PT-Home ,OT- Home , SLP-  Projected Follow-up: PT-Home health PT, Outpatient PT, 24 hour supervision/assistance, OT-  24 hour supervision/assistance, Outpatient OT, SLP-  Projected Equipment Needs: PT-To be determined, OT- To be determined, SLP-  Equipment Details: PT- , OT-  Patient/family involved in discharge planning: PT- Patient,  OT-Patient, SLP-   MD ELOS: 7-10d Medical Rehab Prognosis:  Good Assessment: The patient has been admitted for CIR therapies with the diagnosis of CVA. The team will be addressing functional mobility, strength, stamina,  balance, safety, adaptive techniques and equipment, self-care, bowel and bladder mgt, patient and caregiver education, Respiratory management .  Goals have been set at Mod I/Sup. Anticipated discharge destination is Home.        See Team Conference Notes for weekly updates to the plan of care

## 2024-05-18 NOTE — Progress Notes (Signed)
 Babs Arthea DASEN, MD  Physician Physical Medicine and Rehabilitation   Consult Note    Signed   Date of Service: 05/14/2024 10:17 AM  Related encounter: ED to Hosp-Admission (Discharged) from 05/11/2024 in [facility]   Signed     Expand All Collapse All           Physical Medicine and Rehabilitation Consult Reason for Consult: Left-sided weakness after stroke Referring Physician: Jerri     HPI: [name] is a 73 y.o. female with a history of lung cancer, hypertension, diabetes, migraine headaches who was admitted on 05/12/2024 with headaches dizziness and hypoxia.  She is also was found to have left-sided weakness.  No TNK given due to the fact that patient was outside the therapeutic window.  CT and CTA were unremarkable.  MRI revealed right MCA, ACA, and MCA/PCA infarcts felt to be embolic.  Loop recorder was recommended.  Patient was on no antithrombotics prior to admission, now she is on Plavix 75 mg daily.  Patient was up with therapies yesterday and transferred sit to stand with min assist.  She ambulated 20 feet mod assist using rolling walker.  Therapy notes significant ataxia and scissoring involving the left lower extremity.  For basic ADLs she was mod to max assistance.  Prior to the arrival patient was independent and working.  She lives with spouse in a two-level house with one-step to enter.  It appears that she could live on the first floor if needed.       Home: Home Living Family/patient expects to be discharged to:: Private residence Living Arrangements: Spouse/significant other Available Help at Discharge: Other (Comment) Type of Home: House Home Access: Stairs to enter [employer] of Steps: 1 Entrance Stairs-Rails: None Home Layout: Two level Alternate Level Stairs-Number of Steps: flight Alternate Level Stairs-Rails: Left, Right Bathroom Shower/Tub: Health visitor: Handicapped height Bathroom  Accessibility: Yes Home Equipment: Grab bars - toilet, Grab bars - tub/shower, Shower seat - built in, [place] - single point, [employer] - quad, Agricultural consultant (2 wheels) Additional Comments: lives with significant other, walk in shower downstairs on main level, has bathtub upstairs  Lives With: Significant other  Functional History: Prior Function Prior Level of Function : Independent/Modified Independent Mobility Comments: not using AD, recent fall 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 Functional Status:  Mobility: Bed Mobility Overal bed mobility: Needs Assistance Bed Mobility: Supine to Sit, Sit to Supine Supine to sit: Min assist Sit to supine: Min assist, Used rails General bed mobility comments: min A in/out of bed, Pt with L side neglect and was not able to move [name] off EOB without verbal cueing, L lateral lean so physical assist to ensure did not fall off EOB Transfers Overall transfer level: Needs assistance Equipment used: Rolling walker (2 wheels) Transfers: Sit to/from Stand, Bed to chair/wheelchair/BSC Sit to Stand: Min assist, Mod assist Bed to/from chair/wheelchair/BSC transfer type:: Step pivot Step pivot transfers: Mod assist General transfer comment: Good strength for boost but min/mod A due to L lateral lean at times, not able to recover without physical assist. Ambulation/Gait Ambulation/Gait assistance: Mod assist Gait Distance (Feet): 20 Feet Assistive device: Rolling walker (2 wheels) Gait Pattern/deviations: Step-to pattern, Decreased stride length, Decreased weight shift to right, Shuffle, Ataxic, Scissoring, Staggering left, Drifts right/left, Trunk flexed, Narrow base of support General Gait Details: Heavy leftward lean at times, requiring up to mod assist to correct. Cues for awareness of  [name] placement, to widen BOS, and correct position within RW to maximize support. [name] grip strength adequate to hold RW but needs assist at all times for  balance and RW control.  Ataxic without overt buckling. Needs cued frequently to lead with [name] due to neglect of [name] placement if stepping forward with RLE first. Gait velocity: dec Gait velocity interpretation: <1.31 ft/sec, indicative of household ambulator Pre-gait activities: Engineer, water march   ADL: ADL Overall ADL's : Needs assistance/impaired Eating/Feeding: Set up, Sitting Grooming: Set up, Sitting Upper Body Bathing: Minimal assistance, Moderate assistance, Sitting Lower Body Bathing: Maximal assistance, Sitting/lateral leans Upper Body Dressing : Minimal assistance, Sitting Lower Body Dressing: Maximal assistance, Sitting/lateral leans, Sit to/from stand Toilet Transfer: Moderate assistance, Rolling walker (2 wheels), BSC/3in1 Toileting- Clothing Manipulation and Hygiene: Minimal assistance, Moderate assistance, Sitting/lateral lean Functional mobility during ADLs: Moderate assistance, Rolling walker (2 wheels) General ADL Comments: Pt with L side neglect, L lateral lean with sitting/standing limiting safety with ADLs and transfers. overall good strength and ROM but needs help with physical support and cueing for balance   Cognition: Cognition Overall Cognitive Status: Within Functional Limits for tasks assessed Arousal/Alertness: Awake/alert Orientation Level: Oriented X4 Attention: Sustained Sustained Attention: Appears intact Memory: Impaired Memory Impairment: Decreased recall of new information (66% accurate) Awareness: Appears intact Problem Solving: Appears intact Cognition Arousal: Alert Behavior During Therapy: WFL for tasks assessed/performed Overall Cognitive Status: Within Functional Limits for tasks assessed     Review of Systems  Constitutional: Negative.   HENT: Negative.    Eyes: Negative.   Respiratory:  Positive for shortness of breath.   Cardiovascular: Negative.   Gastrointestinal: Negative.   Genitourinary: Negative.   Musculoskeletal: Negative.    Skin: Negative.   Neurological:  Positive for dizziness, sensory change, focal weakness and weakness.  Psychiatric/Behavioral: Negative.         Past Medical History:  Diagnosis Date   Alcohol abuse, in remission     Anxiety     Cancer of sigmoid colon (HCC) 07/2005    T2N0M0   Chronic bronchitis     COPD (chronic obstructive pulmonary disease) (HCC)     Depression     Diabetes mellitus without complication (HCC)     GERD (gastroesophageal reflux disease)     History of thyroid  nodule      monitoring   Hyperlipidemia     Hypertension     Migraines     OA (osteoarthritis)     PONV (postoperative nausea and vomiting)     Reflux               Past Surgical History:  Procedure Laterality Date   ABDOMINAL EXPOSURE N/A 11/24/2016    Procedure: ABDOMINAL EXPOSURE;  Surgeon: Krystal JULIANNA Doing, MD;  Location: [facility] OR;  Service: Vascular;  Laterality: N/A;   ABDOMINAL HYSTERECTOMY   1992    age 73 and appendics   ANTERIOR LUMBAR FUSION Bilateral 11/24/2016    Procedure: LUMBAR 5-SACRUM 1 ANTERIOR LUMBAR INTERBODY FUSION;  Surgeon: Oneil Priestly, MD;  Location: [facility] OR;  Service: Orthopedics;  Laterality: Bilateral;  LUMBAR 5-SACRUM 1 ANTERIOR LUMBAR INTERBODY FUSION    ARTHRODESIS METATARSALPHALANGEAL JOINT (MTPJ) Left 08/15/2023    Procedure: ARTHRODESIS METATARSALPHALANGEAL JOINT (MTPJ);  Surgeon: Tobie Franky SQUIBB, DPM;  Location: [facility] ORS;  Service: Orthopedics/Podiatry;  Laterality: Left;  POPLITEAL/SAPHENOUS BLOCK   bilateral breast surgery         benign tumors removed   CHOLECYSTECTOMY   2007   COLON RESECTION  2006   COLONOSCOPY   2019   HAND SURGERY   2010   repair of incisional hernia   2012   SHOULDER ARTHROSCOPY Left 2009   SHOULDER SURGERY Right 1997    rotator cuff   TONSILLECTOMY        as a child   TOTAL KNEE ARTHROPLASTY Left 08/11/2021    Procedure: TOTAL KNEE ARTHROPLASTY;  Surgeon: Beverley Evalene BIRCH, MD;  Location: [facility] ORS;  Service: Orthopedics;  Laterality: Left;    TOTAL KNEE ARTHROPLASTY Right 06/29/2022    Procedure: TOTAL KNEE ARTHROPLASTY;  Surgeon: Beverley Evalene BIRCH, MD;  Location: [facility] ORS;  Service: Orthopedics;  Laterality: Right;   VIDEO BRONCHOSCOPY WITH ENDOBRONCHIAL NAVIGATION N/A 04/23/2024    Procedure: VIDEO BRONCHOSCOPY WITH ENDOBRONCHIAL NAVIGATION;  Surgeon: Shelah Lamar RAMAN, MD;  Location: [facility] ENDOSCOPY;  Service: Pulmonary;  Laterality: N/A;  lingular nodule             Family History  Problem Relation Age of Onset   Heart disease Mother          Previous MI at age 55-55   Atrial fibrillation Mother     Stroke Mother     Heart failure Mother     Seizures Father          poss d/t alcohol   Cancer Father 67        Prostate cancer    Diabetes Sister     Arrhythmia Sister     Cancer Maternal Aunt          lung cancer    Cancer Maternal Uncle          kidney ca        Social History:  reports that she has been smoking cigarettes and e-cigarettes. She started smoking about 54 years ago. She has a 41.1 pack-year smoking history. She has never used smokeless tobacco. She reports that she does not drink alcohol and does not use drugs. Allergies:  Allergies       Allergies  Allergen Reactions   Bc Cherry [Aspirin -Caffeine] Anaphylaxis   Benadryl  [Diphenhydramine  Hcl] Nausea And Vomiting and Other (See Comments)      Migraine headaches.   Lansoprazole Nausea And Vomiting and Other (See Comments)      Migraine headache   Prevacid* due to dye in med    Morphine Sulfate Nausea And Vomiting and Other (See Comments)      Migraine headaches.   Red Dye #40 (Allura Red) Other (See Comments)      Also allergic to [facility] Dye-migraine headaches.   Simvastatin Nausea And Vomiting and Other (See Comments)      Migraine     Cephalexin        Other Reaction(s): severe fatigue   Macrolides And Ketolides Nausea And Vomiting   Other        Brown dye,medicine with brown dye   Codeine Nausea And Vomiting   Penicillins Nausea And Vomiting  and Other (See Comments)      INTOLERANCE >  NAUSEA & VOMITING YEAST INFECTION > CHANGE IN NORMAL FLORA   Shellfish Allergy Other (See Comments)      Congestion   Other Reaction(s): migraine   Sulfa Antibiotics Nausea And Vomiting            Medications Prior to Admission  Medication Sig Dispense Refill   albuterol  (VENTOLIN  HFA) 108 (90 Base) MCG/ACT inhaler Inhale 1-2 puffs into the lungs every 4 (four) hours as needed for wheezing  or shortness of breath.       amitriptyline  (ELAVIL ) 25 MG tablet Take 100 mg by mouth at bedtime.       ARIPiprazole  (ABILIFY ) 20 MG tablet Take 20 mg by mouth daily.       calcium  carbonate (TUMS - DOSED IN MG ELEMENTAL CALCIUM ) 500 MG chewable tablet Chew 2 tablets by mouth 4 (four) times daily as needed for indigestion or heartburn.       Cholecalciferol  1.25 MG (50000 UT) capsule Take 50,000 Units by mouth every 7 (seven) days.       famotidine  (PEPCID ) 20 MG tablet Take 20 mg by mouth at bedtime.       gabapentin  (NEURONTIN ) 100 MG capsule Take 100 mg by mouth 2 (two) times daily.       metFORMIN  (GLUCOPHAGE -XR) 500 MG 24 hr tablet Take 1,000 mg by mouth 2 (two) times daily.       mupirocin ointment (BACTROBAN) 2 % Apply 1 Application topically daily as needed (Blisters).       rosuvastatin  (CRESTOR ) 5 MG tablet Take 5 mg by mouth daily.       valsartan-hydrochlorothiazide (DIOVAN-HCT) 320-12.5 MG tablet Take 1 tablet by mouth daily.                Blood pressure 138/79, pulse 73, temperature 97.7 F (36.5 C), temperature source Oral, resp. rate 20, SpO2 100%. Physical Exam Constitutional:      General: She is not in acute distress. HENT:     Head: Normocephalic.     Right Ear: External ear normal.     Left Ear: External ear normal.     Nose: Nose normal.     Mouth/Throat:     Mouth: Mucous membranes are moist.  Eyes:     Conjunctiva/sclera: Conjunctivae normal.  Cardiovascular:     Rate and Rhythm: Normal rate and regular rhythm.   Pulmonary:     Effort: Pulmonary effort is normal.  Abdominal:     General: Bowel sounds are normal.  Musculoskeletal:        General: No swelling.     Cervical back: Normal range of motion.  Skin:    Findings: Lesion present.     Comments: Numerous plaques, abrasions, wounds  Neurological:     Mental Status: She is alert.     Comments: Alert and oriented x 3. Normal insight and awareness. Intact Memory. Normal language and speech. Cranial nerve exam unremarkable. MMT: RUE 5/5, RLE 4+ to 5/5 prox to distal. [name] 4-/5 prox to distal. [name] 4- prox to 4/5 distally. Decreased FMC. [name] and [name]. Mild left limb ataxia. Sensory function 1+/2 LT left arm and leg. No abnl resting tone. Toes down  Psychiatric:        Mood and Affect: Mood normal.        Behavior: Behavior normal.       Lab Results Last 24 Hours       Results for orders placed or performed during the hospital encounter of 05/11/24 (from the past 24 hours)  Glucose, capillary     Status: Abnormal    Collection Time: 05/13/24 12:04 PM  Result Value Ref Range    Glucose-Capillary 222 (H) 70 - 99 mg/dL  Glucose, capillary     Status: Abnormal    Collection Time: 05/13/24  4:31 PM  Result Value Ref Range    Glucose-Capillary 300 (H) 70 - 99 mg/dL  Glucose, capillary     Status: Abnormal  Collection Time: 05/13/24  9:08 PM  Result Value Ref Range    Glucose-Capillary 269 (H) 70 - 99 mg/dL    Comment 1 Notify RN      Comment 2 Document in Chart    Basic metabolic panel with GFR     Status: Abnormal    Collection Time: 05/14/24  4:18 AM  Result Value Ref Range    Sodium 136 135 - 145 mmol/L    Potassium 4.7 3.5 - 5.1 mmol/L    Chloride 98 98 - 111 mmol/L    CO2 27 22 - 32 mmol/L    Glucose, Bld 230 (H) 70 - 99 mg/dL    BUN 20 8 - 23 mg/dL    Creatinine, Ser 9.25 0.44 - 1.00 mg/dL    Calcium  9.2 8.9 - 10.3 mg/dL    GFR, Estimated >39 >39 mL/min    Anion gap 11 5 - 15  CBC     Status: Abnormal    Collection Time:  05/14/24  4:18 AM  Result Value Ref Range    WBC 15.4 (H) 4.0 - 10.5 K/uL    RBC 3.56 (L) 3.87 - 5.11 MIL/uL    Hemoglobin 11.4 (L) 12.0 - 15.0 g/dL    HCT 66.1 (L) 63.9 - 46.0 %    MCV 94.9 80.0 - 100.0 fL    MCH 32.0 26.0 - 34.0 pg    MCHC 33.7 30.0 - 36.0 g/dL    RDW 87.5 88.4 - 84.4 %    Platelets 242 150 - 400 K/uL    nRBC 0.0 0.0 - 0.2 %  Phosphorus     Status: None    Collection Time: 05/14/24  4:18 AM  Result Value Ref Range    Phosphorus 3.9 2.5 - 4.6 mg/dL  Magnesium      Status: None    Collection Time: 05/14/24  4:18 AM  Result Value Ref Range    Magnesium  1.9 1.7 - 2.4 mg/dL  Glucose, capillary     Status: Abnormal    Collection Time: 05/14/24  6:41 AM  Result Value Ref Range    Glucose-Capillary 214 (H) 70 - 99 mg/dL    Comment 1 Notify RN      Comment 2 Document in Chart         Imaging Results (Last 48 hours)  ECHOCARDIOGRAM COMPLETE Result Date: 05/13/2024    ECHOCARDIOGRAM REPORT   Patient Name:   NUVIA HILEMAN Date of Exam: 05/13/2024 Medical Rec #:  [id number]       Height:       64.0 in Accession #:    [id number]      Weight:       177.0 lb Date of Birth:  September 10, 1950        BSA:          1.857 m Patient Age:    73 years        BP:           140/81 mmHg Patient Gender: F               HR:           77 bpm. Exam Location:  Inpatient Procedure: 2D Echo, Cardiac Doppler and Color Doppler (Both Spectral and Color            Flow Doppler were utilized during procedure). Indications:    Stroke I63.9  History:        Patient has prior history of Echocardiogram examinations,  most                 recent 11/26/2020. COPD; Risk Factors:Hypertension and Diabetes.  Sonographer:    Jayson Gaskins Referring Phys: [id number] VASUNDHRA RATHORE IMPRESSIONS  1. Left ventricular ejection fraction, by estimation, is 60 to 65%. The left ventricle has normal function. The left ventricle has no regional wall motion abnormalities. There is mild concentric left ventricular hypertrophy. Left  ventricular diastolic parameters are indeterminate.  2. Right ventricular systolic function is normal. The right ventricular size is normal. Tricuspid regurgitation signal is inadequate for assessing PA pressure.  3. The mitral valve is degenerative. Trivial mitral valve regurgitation.  4. The aortic valve is tricuspid. Aortic valve regurgitation is not visualized. Aortic valve sclerosis/calcification is present, without any evidence of aortic stenosis. Aortic valve mean gradient measures 3.0 mmHg.  5. The inferior vena cava is dilated in size with <50% respiratory variability, suggesting right atrial pressure of 15 mmHg. Comparison(s): Prior images reviewed side by side. LVEF 60-65%. No obvious interatrial shunting. FINDINGS  Left Ventricle: Left ventricular ejection fraction, by estimation, is 60 to 65%. The left ventricle has normal function. The left ventricle has no regional wall motion abnormalities. The left ventricular internal cavity size was normal in size. There is  mild concentric left ventricular hypertrophy. Left ventricular diastolic parameters are indeterminate. Right Ventricle: The right ventricular size is normal. No increase in right ventricular wall thickness. Right ventricular systolic function is normal. Tricuspid regurgitation signal is inadequate for assessing PA pressure. Left Atrium: Left atrial size was normal in size. Right Atrium: Right atrial size was normal in size. Pericardium: There is no evidence of pericardial effusion. Presence of epicardial fat layer. Mitral Valve: The mitral valve is degenerative in appearance. Mild to moderate mitral annular calcification. Trivial mitral valve regurgitation. MV peak gradient, 8.0 mmHg. The mean mitral valve gradient is 4.0 mmHg. Tricuspid Valve: The tricuspid valve is grossly normal. Tricuspid valve regurgitation is trivial. Aortic Valve: The aortic valve is tricuspid. There is mild aortic valve annular calcification. Aortic valve regurgitation  is not visualized. Aortic valve sclerosis/calcification is present, without any evidence of aortic stenosis. Aortic valve mean gradient measures 3.0 mmHg. Aortic valve peak gradient measures 5.0 mmHg. Aortic valve area, by VTI measures 2.05 cm. Pulmonic Valve: The pulmonic valve was not well visualized. Pulmonic valve regurgitation is trivial. Aorta: The aortic root and ascending aorta are structurally normal, with no evidence of dilitation. Venous: The inferior vena cava is dilated in size with less than 50% respiratory variability, suggesting right atrial pressure of 15 mmHg. IAS/Shunts: No atrial level shunt detected by color flow Doppler. Additional Comments: 3D was performed not requiring image post processing on an independent workstation and was indeterminate.  LEFT VENTRICLE PLAX 2D LVIDd:         3.90 cm   Diastology LVIDs:         2.60 cm   LV e' medial:    7.40 cm/s LV PW:         1.10 cm   LV E/e' medial:  16.1 LV IVS:        1.20 cm   LV e' lateral:   8.92 cm/s LVOT diam:     1.80 cm   LV E/e' lateral: 13.3 LV SV:         46 LV SV Index:   25 LVOT Area:     2.54 cm  RIGHT VENTRICLE RV S prime:     20.10 cm/s TAPSE (M-mode):  2.2 cm LEFT ATRIUM           Index        RIGHT ATRIUM          Index LA Vol (A2C): 34.4 ml 18.52 ml/m  RA Area:     9.68 cm LA Vol (A4C): 51.5 ml 27.73 ml/m  RA Volume:   17.50 ml 9.42 ml/m  AORTIC VALVE AV Area (Vmax):    2.10 cm AV Area (Vmean):   2.02 cm AV Area (VTI):     2.05 cm AV Vmax:           112.00 cm/s AV Vmean:          82.300 cm/s AV VTI:            0.225 m AV Peak Grad:      5.0 mmHg AV Mean Grad:      3.0 mmHg LVOT Vmax:         92.60 cm/s LVOT Vmean:        65.200 cm/s LVOT VTI:          0.181 m LVOT/AV VTI ratio: 0.80  AORTA Ao Root diam: 3.10 cm MITRAL VALVE MV Area (PHT): 3.50 cm     SHUNTS MV Area VTI:   1.19 cm     Systemic VTI:  0.18 m MV Peak grad:  8.0 mmHg     Systemic Diam: 1.80 cm MV Mean grad:  4.0 mmHg MV Vmax:       1.42 m/s MV Vmean:       92.9 cm/s MV Decel Time: 217 msec MV E velocity: 119.00 cm/s MV A velocity: 123.00 cm/s MV E/A ratio:  0.97 Jayson Sierras MD Electronically signed by Jayson Sierras MD Signature Date/Time: 05/13/2024/12:39:45 PM    Final     VAS US  LOWER EXTREMITY VENOUS (DVT) Result Date: 05/12/2024  Lower Venous DVT Study Patient Name:  FAIGY STRETCH  Date of Exam:   05/12/2024 Medical Rec #: [id number]        Accession #:    [id number] Date of Birth: [birth year]         Patient Gender: F Patient Age:   60 years Exam Location:  [facility] Procedure:      VAS US  LOWER EXTREMITY VENOUS (DVT) Referring Phys: ARY XU --------------------------------------------------------------------------------  Indications: Stroke in cancer patient.  Risk Factors: Newly diagnosed squamous cell cancer of the lung. Comparison Study: Prior negative left LEV done 04/15/20 at [facility] Performing Technologist: Alberta Lis RVS  Examination Guidelines: A complete evaluation includes B-mode imaging, spectral Doppler, color Doppler, and power Doppler as needed of all accessible portions of each vessel. Bilateral testing is considered an integral part of a complete examination. Limited examinations for reoccurring indications may be performed as noted. The reflux portion of the exam is performed with the patient in reverse Trendelenburg.  +---------+---------------+---------+-----------+----------+--------------+ RIGHT    CompressibilityPhasicitySpontaneityPropertiesThrombus Aging +---------+---------------+---------+-----------+----------+--------------+ CFV      Full           Yes      Yes                                 +---------+---------------+---------+-----------+----------+--------------+ SFJ      Full                                                        +---------+---------------+---------+-----------+----------+--------------+  FV Prox  Full           Yes      Yes                                  +---------+---------------+---------+-----------+----------+--------------+ FV Mid   Full           Yes      Yes                                 +---------+---------------+---------+-----------+----------+--------------+ FV DistalFull           Yes      Yes                                 +---------+---------------+---------+-----------+----------+--------------+ PFV      Full           Yes      Yes                                 +---------+---------------+---------+-----------+----------+--------------+ POP      Full                                                        +---------+---------------+---------+-----------+----------+--------------+ PTV      Full                                                        +---------+---------------+---------+-----------+----------+--------------+ PERO     Full                                                        +---------+---------------+---------+-----------+----------+--------------+   +---------+---------------+---------+-----------+----------+--------------+ LEFT     CompressibilityPhasicitySpontaneityPropertiesThrombus Aging +---------+---------------+---------+-----------+----------+--------------+ CFV      Full           Yes      Yes                                 +---------+---------------+---------+-----------+----------+--------------+ SFJ      Full                                                        +---------+---------------+---------+-----------+----------+--------------+ FV Prox  Full           Yes      Yes                                 +---------+---------------+---------+-----------+----------+--------------+ FV Mid   Full                                                        +---------+---------------+---------+-----------+----------+--------------+  FV DistalFull                                                         +---------+---------------+---------+-----------+----------+--------------+ PFV      Full           Yes      Yes                                 +---------+---------------+---------+-----------+----------+--------------+ POP      Full           Yes      Yes                                 +---------+---------------+---------+-----------+----------+--------------+ PTV      Full                                                        +---------+---------------+---------+-----------+----------+--------------+ PERO     Full                                                        +---------+---------------+---------+-----------+----------+--------------+     Summary: BILATERAL: - No evidence of deep vein thrombosis seen in the lower extremities, bilaterally. -No evidence of popliteal cyst, bilaterally.   *See table(s) above for measurements and observations. Electronically signed by Gaile New MD on 05/12/2024 at 10:12:34 PM.    Final        Assessment/Plan: Diagnosis: 73 year old female with right MCA, ACA embolic infarcts   Does the need for close, 24 hr/day medical supervision in concert with the patient's rehab needs make it unreasonable for this patient to be served in a less intensive setting? Yes Co-Morbidities requiring supervision/potential complications:  - Recently diagnosed stage Ia lung cancer -Diabetes -Hypertension -Tobacco use Due to bladder management, bowel management, safety, skin/wound care, disease management, medication administration, pain management, and patient education, does the patient require 24 hr/day rehab nursing? Yes Does the patient require coordinated care of a physician, rehab nurse, therapy disciplines of PT, OT to address physical and functional deficits in the context of the above medical diagnosis(es)? Yes Addressing deficits in the following areas: balance, endurance, locomotion, strength, transferring, bowel/bladder control, bathing,  dressing, feeding, grooming, toileting, and psychosocial support Can the patient actively participate in an intensive therapy program of at least 3 hrs of therapy per day at least 5 days per week? Yes The potential for patient to make measurable gains while on inpatient rehab is excellent Anticipated functional outcomes upon discharge from inpatient rehab are modified independent and supervision  with PT, modified independent and supervision with OT, n/a with SLP. Estimated rehab length of stay to reach the above functional goals is: 8-13 days Anticipated discharge destination: Home Overall Rehab/Functional Prognosis: excellent   POST ACUTE RECOMMENDATIONS: This patient's condition is appropriate for continued rehabilitative care in the following setting: CIR Patient  has agreed to participate in recommended program. Yes Note that insurance prior authorization may be required for reimbursement for recommended care.   Comment: Pt motivated, wants to regain independence. Spouse available for assit Rehab Admissions Coordinator to follow up.          I have personally performed a face to face diagnostic evaluation of this patient. Additionally, I have examined the patient's medical record including any pertinent labs and radiographic images.     Thanks,   Arthea ONEIDA Gunther, MD 05/14/2024          Routing History

## 2024-05-18 NOTE — Progress Notes (Signed)
 Emeline Joesph BROCKS, DO  Physician Physical Medicine and Rehabilitation   PMR Pre-admission    Signed   Date of Service: 05/17/2024  9:57 AM  Related encounter: ED to Hosp-Admission (Discharged) from 05/11/2024 in [facility]   Signed     Expand All Collapse All  PMR Admission Coordinator Pre-Admission Assessment   Patient: [name] is an 73 y.o., female MRN: [id number] DOB: [birth year] Height:   Weight:     Insurance Information HMO: yes    PPO:      PCP:      IPA:      80/20:      OTHER:  PRIMARY: BCBS Medicare      Policy#: [id number]      Subscriber: pt CM Name: harlene      Phone#: n/p     Fax#: [phone]    Pre-Cert#: [id number] auth for CIR from harlene with [facility] Medicare for admit 10/16 with next review date 10/22.  Updates due to [facility] at fax listed above.        Employer:  Benefits:  Phone #: [phone]     Name:  Eff. Date: 08/03/23     Deduct: $0      Out of Pocket Max: $3500 (met [phone])      Life Max: n/a CIR: $400/day for days 1-5      SNF: 20 full days Outpatient:      Co-Pay: $10/visit Home Health: 100%      Co-Pay:  DME: 80%     Co-Pay: 20% Providers:  SECONDARY:       Policy#:      Phone#:    Artist:       Phone#:    The Engineer, materials Information Summary" for patients in Inpatient Rehabilitation Facilities with attached "Privacy Act Statement-Health Care Records" was provided and verbally reviewed with: Patient and Family   Emergency Contact Information Contact Information       Name Relation Home Work Mobile    Douglas Significant other     [phone]         Other Contacts   None on File        Current Medical History  Patient Admitting Diagnosis: CVA    History of Present Illness: Pt is a 73 y/o female with PMH of squamous cell carcinoma of the lung (newly diagnosed in Sept, stage 1A), COPD, tobacco use, coronary artery calcification, depression, DM, HTN, migraines who presented to  ED on 10/10 with sudden onset SOB though to be allergic reaction to BC powder taken for headache.  Initial O2 on room air was 65% with dyspnea and facial swelling.  EMS gave solu-medrol , 2 rounds of epi, and zofran .  In ED pt tachypneic but satting 96% on 6L o2.  Code stroke activated as pt noted in ED to have L neglect and hemiparesis.  She was outside the window for TNK.  Labs notable for D-dimer 7.84, ABG pH 7.31, pCO2 49, and pO2 88.  CTA negative for PE.  CT head negative, CTA head/neck showed LVO and CT perfusion showing no evidence of ischemia.  Neurology was consulted and stroke workup completed.  MRI confirmed R MCA/PCA/ACA infarcts, ECHO with EF 65%, A1C 7.4.  Recommendations for loop recorder and plavix daily given aspirin  allergy.  Of note, no brain mets noted on MRI.  Therapy evaluations completed and pt was recommended for CIR.  Complete NIHSS TOTAL: 4   Patient's medical record from [facility] has been reviewed by the rehabilitation admission coordinator and physician.   Past Medical History      Past Medical History:  Diagnosis Date   Alcohol abuse, in remission     Anxiety     Cancer of sigmoid colon (HCC) 07/2005    [id number]   Chronic bronchitis     COPD (chronic obstructive pulmonary disease) (HCC)     Depression     Diabetes mellitus without complication (HCC)     GERD (gastroesophageal reflux disease)     History of thyroid  nodule      monitoring   Hyperlipidemia     Hypertension     Migraines     OA (osteoarthritis)     PONV (postoperative nausea and vomiting)     Reflux            Has the patient had major surgery during 100 days prior to admission? No   Family History   family history includes Arrhythmia in her sister; Atrial fibrillation in her mother; Cancer in her maternal aunt and maternal uncle; Cancer (age of onset: 45) in her father; Diabetes in her sister; Heart disease in her mother; Heart failure in her mother; Seizures in her father; Stroke in her  mother.   Current Medications  Current Medications    Current Facility-Administered Medications:    acetaminophen  (TYLENOL ) tablet 650 mg, 650 mg, Oral, Q6H PRN, Rathore, Vasundhra, MD, 650 mg at 05/12/24 0536   amitriptyline  (ELAVIL ) tablet 100 mg, 100 mg, Oral, QHS, Rathore, Vasundhra, MD, 100 mg at 05/13/24 2135   ARIPiprazole  (ABILIFY ) tablet 20 mg, 20 mg, Oral, Daily, Rathore, Vasundhra, MD, 20 mg at 05/14/24 0851   budesonide (PULMICORT) nebulizer solution 0.25 mg, 0.25 mg, Nebulization, BID, Rathore, Vasundhra, MD, 0.25 mg at 05/14/24 0827   clopidogrel (PLAVIX) tablet 75 mg, 75 mg, Oral, Daily, Rathore, Vasundhra, MD, 75 mg at 05/14/24 0851   enoxaparin (LOVENOX) injection 40 mg, 40 mg, Subcutaneous, Q24H, Rathore, Vasundhra, MD, 40 mg at 05/13/24 2134   famotidine  (PEPCID ) tablet 20 mg, 20 mg, Oral, BID, Barbarann Nest, MD, 20 mg at 05/14/24 9148   gabapentin  (NEURONTIN ) capsule 100 mg, 100 mg, Oral, BID, Rathore, Vasundhra, MD, 100 mg at 05/14/24 0851   glucagon (human recombinant) (GLUCAGEN) injection 1 mg, 1 mg, Intravenous, PRN, Amin, Ankit C, MD   insulin  aspart (novoLOG ) injection 0-5 Units, 0-5 Units, Subcutaneous, QHS, Rathore, Vasundhra, MD, 2 Units at 05/13/24 2137   insulin  aspart (novoLOG ) injection 0-9 Units, 0-9 Units, Subcutaneous, TID WC, Rathore, Vasundhra, MD, 2 Units at 05/14/24 1226   insulin  glargine (LANTUS) injection 10 Units, 10 Units, Subcutaneous, Daily, Amin, Ankit C, MD, 10 Units at 05/14/24 0905   ipratropium-albuterol  (DUONEB) 0.5-2.5 (3) MG/3ML nebulizer solution 3 mL, 3 mL, Nebulization, Q4H PRN, Amin, Ankit C, MD   [START ON 05/15/2024] ipratropium-albuterol  (DUONEB) 0.5-2.5 (3) MG/3ML nebulizer solution 3 mL, 3 mL, Nebulization, BID, Amin, Ankit C, MD   labetalol (NORMODYNE) injection 10 mg, 10 mg, Intravenous, Q2H PRN, Amin, Ankit C, MD   methylPREDNISolone  sodium succinate (SOLU-MEDROL ) 40 mg/mL injection 40 mg, 40 mg, Intravenous, Q12H, Rathore,  Vasundhra, MD, 40 mg at 05/14/24 0851   nicotine (NICODERM CQ - dosed in mg/24 hours) patch 14 mg, 14 mg, Transdermal, Daily, Barbarann Nest, MD, 14 mg at 05/14/24 0855   ondansetron  (ZOFRAN ) injection 4 mg, 4 mg, Intravenous, Q6H PRN, Amin, Ankit C, MD, 4 mg at 05/13/24 1101   rosuvastatin  (CRESTOR ) tablet 5 mg, 5 mg,  Oral, Daily, Rathore, Vasundhra, MD, 5 mg at 05/14/24 9148     Patients Current Diet:  Diet Order                  Diet heart healthy/carb modified Room service appropriate? Yes; Fluid consistency: Thin  Diet effective now                         Precautions / Restrictions Precautions Precautions: Fall Restrictions Weight Bearing Restrictions Per Provider Order: No    Has the patient had 2 or more falls or a fall with injury in the past year? No   Prior Activity Level Community (5-7x/wk): independent prior to admit with no DME, driving, partner has MS but is independent at baseline   Prior Functional Level Self Care: Did the patient need help bathing, dressing, using the toilet or eating? Independent   Indoor Mobility: Did the patient need assistance with walking from room to room (with or without device)? Independent   Stairs: Did the patient need assistance with internal or external stairs (with or without device)? Independent   Functional Cognition: Did the patient need help planning regular tasks such as shopping or remembering to take medications? Independent   Patient Information Are you of Hispanic, Latino/a,or Spanish origin?: A. No, not of Hispanic, Latino/a, or Spanish origin What is your race?: A. White Do you need or want an interpreter to communicate with a doctor or health care staff?: 0. No   Patient's Response To:  Health Literacy and Transportation Is the patient able to respond to health literacy and transportation needs?: Yes Health Literacy - How often do you need to have someone help you when you read instructions, pamphlets, or  other written material from your doctor or pharmacy?: Never In the past 12 months, has lack of transportation kept you from medical appointments or from getting medications?: No In the past 12 months, has lack of transportation kept you from meetings, work, or from getting things needed for daily living?: No   Journalist, newspaper / Equipment Home Equipment: Grab bars - toilet, Grab bars - tub/shower, Shower seat - built in, [place] - single point, [employer] - quad, Agricultural consultant (2 wheels)   Prior Device Use: Indicate devices/aids used by the patient prior to current illness, exacerbation or injury? None of the above   Current Functional Level Cognition   Arousal/Alertness: Awake/alert Overall Cognitive Status: Within Functional Limits for tasks assessed Orientation Level: Oriented X4 Attention: Sustained Sustained Attention: Appears intact Memory: Impaired Memory Impairment: Decreased recall of new information (66% accurate) Awareness: Appears intact Problem Solving: Appears intact    Extremity Assessment (includes Sensation/Coordination)   Upper Extremity Assessment: [name] deficits/detail [name] Deficits / Details: good strength, fair proprioception when tested but during functional activities Pt with L side neglect requiring verbal cueing for [name] placement and safety. [name] Sensation: decreased proprioception [name] Coordination: WNL  Lower Extremity Assessment: [name] deficits/detail [name] Deficits / Details: Lt hallux 3/5, ankle DF 4/5, ankle eversion 4/5 [name] Sensation:  (Sensation intact to light touch) [name] Coordination: decreased fine motor, decreased gross motor     ADLs   Overall ADL's : Needs assistance/impaired Eating/Feeding: Set up, Sitting Grooming: Set up, Sitting Upper Body Bathing: Minimal assistance, Moderate assistance, Sitting Lower Body Bathing: Maximal assistance, Sitting/lateral leans Upper Body Dressing : Minimal assistance, Sitting Lower Body Dressing: Maximal assistance,  Sitting/lateral leans, Sit to/from stand Toilet Transfer: Moderate assistance, Rolling walker (2 wheels), BSC/3in1 Toileting- Clothing  Manipulation and Hygiene: Minimal assistance, Moderate assistance, Sitting/lateral lean Functional mobility during ADLs: Moderate assistance, Rolling walker (2 wheels) General ADL Comments: Pt with L side neglect, L lateral lean with sitting/standing limiting safety with ADLs and transfers. overall good strength and ROM but needs help with physical support and cueing for balance     Mobility   Overal bed mobility: Needs Assistance Bed Mobility: Supine to Sit, Sit to Supine Supine to sit: Min assist Sit to supine: Min assist, Used rails General bed mobility comments: min A in/out of bed, Pt with L side neglect and was not able to move [name] off EOB without verbal cueing, L lateral lean so physical assist to ensure did not fall off EOB     Transfers   Overall transfer level: Needs assistance Equipment used: Rolling walker (2 wheels) Transfers: Sit to/from Stand, Bed to chair/wheelchair/BSC Sit to Stand: Min assist, Mod assist Bed to/from chair/wheelchair/BSC transfer type:: Step pivot Step pivot transfers: Mod assist General transfer comment: Good strength for boost but min/mod A due to L lateral lean at times, not able to recover without physical assist.     Ambulation / Gait / Stairs / Wheelchair Mobility   Ambulation/Gait Ambulation/Gait assistance: Mod assist Gait Distance (Feet): 20 Feet Assistive device: Rolling walker (2 wheels) Gait Pattern/deviations: Step-to pattern, Decreased stride length, Decreased weight shift to right, Shuffle, Ataxic, Scissoring, Staggering left, Drifts right/left, Trunk flexed, Narrow base of support General Gait Details: Heavy leftward lean at times, requiring up to mod assist to correct. Cues for awareness of [name] placement, to widen BOS, and correct position within RW to maximize support. [name] grip strength adequate to hold  RW but needs assist at all times for balance and RW control.  Ataxic without overt buckling. Needs cued frequently to lead with [name] due to neglect of [name] placement if stepping forward with RLE first. Gait velocity: dec Gait velocity interpretation: <1.31 ft/sec, indicative of household ambulator Pre-gait activities: Engineer, water march     Posture / Balance Dynamic Sitting Balance Sitting balance - Comments: L lateral lean at times requiring physical assist Balance Overall balance assessment: Needs assistance Sitting-balance support: No upper extremity supported, Feet supported Sitting balance-Leahy Scale: Poor Sitting balance - Comments: L lateral lean at times requiring physical assist Postural control: Left lateral lean Standing balance support: Bilateral upper extremity supported, During functional activity, Reliant on assistive device for balance Standing balance-Leahy Scale: Poor Standing balance comment: L lateral lean at times, not able to self correct without cueing and physical assist     Special considerations/life events  Oxygen  intermittently in hospital 2L, Diabetic management yes, and Special service needs new lung cancer dx    Previous Home Environment (from acute therapy documentation) Living Arrangements: Spouse/significant other  Lives With: Significant other Available Help at Discharge: Other (Comment) Type of Home: House Home Layout: Two level Alternate Level Stairs-Rails: Left, Right Alternate Level Stairs-Number of Steps: flight Home Access: Stairs to enter Entrance Stairs-Rails: None Entrance Stairs-Number of Steps: 1 Bathroom Shower/Tub: Health visitor: Handicapped height Bathroom Accessibility: Yes Additional Comments: lives with significant other, walk in shower downstairs on main level, has bathtub upstairs   Discharge Living Setting Plans for Discharge Living Setting: Patient's home, Lives with (comment) (partner) Type of Home at Discharge:  House Discharge Home Layout: Two level, Able to live on main level with bedroom/bathroom Alternate Level Stairs-Rails: Can reach both Alternate Level Stairs-Number of Steps: flight Discharge Home Access: Stairs to enter Entrance Stairs-Rails: None Entrance [employer]  of Steps: 1 Discharge Bathroom Shower/Tub: Walk-in shower Discharge Bathroom Toilet: Handicapped height Discharge Bathroom Accessibility: Yes How Accessible: Accessible via walker Does the patient have any problems obtaining your medications?: No   Social/Family/Support Systems Patient Roles: Partner Anticipated Caregiver: Stephane RAMAN (partner) Anticipated Caregiver's Contact Information: [phone] Ability/Limitations of Caregiver: can provide supervision and assist with iADLs (cooking, etc) Caregiver Availability: 24/7 Discharge Plan Discussed with Primary Caregiver: Yes Is Caregiver In Agreement with Plan?: Yes Does Caregiver/Family have Issues with Lodging/Transportation while Pt is in Rehab?: No   Goals Patient/Family Goal for Rehab: PT/OT supervision to mod I, SLP n/a Expected length of stay: 8-10 days Additional Information: Discharge plan: home with partner, Stephane, who can provide supervision.  Also has a neighbor that can check in if needed. Pt/Family Agrees to Admission and willing to participate: Yes Program Orientation Provided & Reviewed with Pt/Caregiver Including Roles  & Responsibilities: Yes   Decrease burden of Care through IP rehab admission: n/a   Possible need for SNF placement upon discharge: Not anticipated.  Plan for short LOS and discharge home at supervision to mod I level.     Patient Condition: This patient's condition remains as documented in the consult dated 05/14/24, in which the Rehabilitation Physician determined and documented that the patient's condition is appropriate for intensive rehabilitative care in an inpatient rehabilitation facility. Will admit to inpatient rehab today.    Preadmission Screen Completed By:  Reche FORBES Lowers, PT, DPT  05/14/2024 3:35 PM ______________________________________________________________________   Discussed status with Dr. Emeline on 05/17/24  at 9:57 AM and received approval for admission today.   Admission Coordinator:  Gao Mitnick E Nekita Pita, PT, time 9:57 AM Pattricia 05/17/24     Assessment/Plan: Diagnosis: 73 year old female with right MCA, ACA embolic infarcts   Does the need for close, 24 hr/day medical supervision in concert with the patient's rehab needs make it unreasonable for this patient to be served in a less intensive setting? Yes Co-Morbidities requiring supervision/potential complications:  - Recently diagnosed stage Ia lung cancer -Diabetes -Hypertension -Tobacco use Due to bladder management, bowel management, safety, skin/wound care, disease management, medication administration, pain management, and patient education, does the patient require 24 hr/day rehab nursing? Yes Does the patient require coordinated care of a physician, rehab nurse, therapy disciplines of PT, OT to address physical and functional deficits in the context of the above medical diagnosis(es)? Yes Addressing deficits in the following areas: balance, endurance, locomotion, strength, transferring, bowel/bladder control, bathing, dressing, feeding, grooming, toileting, and psychosocial support Can the patient actively participate in an intensive therapy program of at least 3 hrs of therapy per day at least 5 days per week? Yes The potential for patient to make measurable gains while on inpatient rehab is excellent Anticipated functional outcomes upon discharge from inpatient rehab are modified independent and supervision  with PT, modified independent and supervision with OT, n/a with SLP. Estimated rehab length of stay to reach the above functional goals is: 8-13 days Anticipated discharge destination: Home Overall Rehab/Functional Prognosis: excellent      MD Signature:   Joesph JAYSON Emeline, DO 05/17/2024           Revision History

## 2024-05-18 NOTE — Progress Notes (Signed)
 PROGRESS NOTE   Subjective/Complaints:  No issues overnite   ROS- neg CP, SOB, N/V/D, no joint pain   Objective:   EP PPM/ICD IMPLANT Result Date: 05/17/2024 SURGEON:  Will Gladis Norton, MD   PREPROCEDURE DIAGNOSIS:  Cryptogenic stroke   POSTPROCEDURE DIAGNOSIS: Cryptogenic stroke    PROCEDURES:  1. Implantable loop recorder implantation   INTRODUCTION:  [name] presents with a history of cryptogenic stroke The costs of loop recorder monitoring have been discussed with the patient. Appropriate time out was performed prior to the procedure.   DESCRIPTION OF PROCEDURE:  Informed written consent was obtained.  The patient required no sedation for the procedure today.  Mapping over the patient's chest was performed to identify the area where electrograms were most prominent for ILR recording.  This area was found to be the left parasternal region over the 4th intercostal space. The patients left chest was therefore prepped and draped in the usual sterile fashion. The skin overlying the left parasternal region was infiltrated with lidocaine  for local analgesia.  A 0.5-cm incision was made over the left parasternal region over the 3rd intercostal space.  A subcutaneous ILR pocket was fashioned using a combination of sharp and blunt dissection.  A Medtronic Reveal LINQ 2 (serial # [id number]) implantable loop recorder was then placed into the pocket  R waves were very prominent and measured 0.8mV.  Steri- Strips and a sterile dressing were then applied.  There were no early apparent complications.   CONCLUSIONS:  1. Successful implantation of a implantable loop recorder for a history of cryptogenic stroke  2. No early apparent complications. Will Gladis Norton, MD 05/17/2024 3:07 PM   Recent Labs    05/17/24 0158 05/18/24 0607  WBC 14.1* 15.2*  HGB 13.1 13.9  HCT 38.7 40.2  PLT 313 332   Recent Labs    05/17/24 0158  05/18/24 0607  NA 136 138  K 3.6 3.9  CL 98 101  CO2 23 26  GLUCOSE 148* 103*  BUN 30* 26*  CREATININE 0.89 0.83  CALCIUM  8.8* 8.7*    Intake/Output Summary (Last 24 hours) at 05/18/2024 0732 Last data filed at 05/17/2024 1823 Gross per 24 hour  Intake 177 ml  Output --  Net 177 ml        Physical Exam: Vital Signs Blood pressure (!) 140/83, pulse (!) 57, temperature (!) 97.5 F (36.4 C), temperature source Oral, resp. rate 16, height 5' 4 (1.626 m), weight 79.8 kg, SpO2 98%.   General: No acute distress Mood and affect are appropriate Heart: Regular rate and rhythm no rubs murmurs or extra sounds Lungs: Clear to auscultation, breathing unlabored, no rales or wheezes Abdomen: Positive bowel sounds, soft nontender to palpation, nondistended Extremities: No clubbing, cyanosis, or edema Skin: No evidence of breakdown, no evidence of rash Neurologic: Cranial nerves II through XII intact, motor strength is 5/5 in right and 4/5 left deltoid, bicep, tricep, grip, hip flexor, knee extensors, ankle dorsiflexor and plantar flexor Fine motor intact BUE Neg dysdiadochokinesis with RAM BUE  Cerebellar exam normal finger to nose to finger Musculoskeletal: Reduced left shoulder flexion and abduction but no pain . No joint swelling  Assessment/Plan: 1. Functional deficits which require 3+ hours per day of interdisciplinary therapy in a comprehensive inpatient rehab setting. Physiatrist is providing close team supervision and 24 hour management of active medical problems listed below. Physiatrist and rehab team continue to assess barriers to discharge/monitor patient progress toward functional and medical goals  Care Tool:  Bathing              Bathing assist       Upper Body Dressing/Undressing Upper body dressing        Upper body assist      Lower Body Dressing/Undressing Lower body dressing            Lower body assist       Toileting Toileting     Toileting assist       Transfers Chair/bed transfer  Transfers assist           Locomotion Ambulation   Ambulation assist              Walk 10 feet activity   Assist           Walk 50 feet activity   Assist           Walk 150 feet activity   Assist           Walk 10 feet on uneven surface  activity   Assist           Wheelchair     Assist               Wheelchair 50 feet with 2 turns activity    Assist            Wheelchair 150 feet activity     Assist          Blood pressure (!) 140/83, pulse (!) 57, temperature (!) 97.5 F (36.4 C), temperature source Oral, resp. rate 16, height 5' 4 (1.626 m), weight 79.8 kg, SpO2 98%.    Medical Problem List and Plan: 1. Functional deficits secondary to right MCA, ACA and MCA/PCA infarcts embolic pattern secondary to embolic source              -patient may  shower             -ELOS/Goals: 8 to 13 days, supervision PT, OT              - Stable to admit to inpatient rehab after loop recorder placement today    2.  Antithrombotics: -DVT/anticoagulation:  Pharmaceutical: Lovenox             -antiplatelet therapy: Plavix (Asprin allergy)   3. Pain Management: Hx chronic pain-Gabapentin  100 mg twice daily.  Tylenol  prn   4. Mood/Behavior/Sleep: LCSW to follow for evaluation and support when available.              -antipsychotic agents: Amitriptyline  100 mg nightly, Abilify  20 mg daily   5. Neuropsych/cognition: This patient IS capable of making decisions on HER own behalf.   6. Skin/Wound Care: Routine pressure relief measures.   7. Fluids/Electrolytes/Nutrition: Monitor I&O, weights.  Labs in a.m.             -GERD: Pepcid     8. Acute multifocal focal infarct: Loop recorder to be placed by EP prior to CIR             - Continue Plavix and statin   9. COPD exacerbation:  Pulmicort and DuoNeb as scheduled. Confirmed with medicine *Prednisone 40  mg until  10/17 followed by 3 days of prednisone at 20 mg daily.             - DuoNeb as needed   10.  Lung CA: Recently diagnosed with lung cancer stage IA.  MRI brain no mets.  Follow-up with oncology   11. AKI: resolved 10/17    Latest Ref Rng & Units 05/18/2024    6:07 AM 05/17/2024    1:58 AM 05/16/2024    3:12 AM  BMP  Glucose 70 - 99 mg/dL 896  851  857   BUN 8 - 23 mg/dL 26  30  31    Creatinine 0.44 - 1.00 mg/dL 9.16  9.10  9.03   Sodium 135 - 145 mmol/L 138  136  137   Potassium 3.5 - 5.1 mmol/L 3.9  3.6  3.8   Chloride 98 - 111 mmol/L 101  98  101   CO2 22 - 32 mmol/L 26  23  26    Calcium  8.9 - 10.3 mg/dL 8.7  8.8  8.9       12. Leukocytosis: reactive secondary to steroids use.  Afebrile  -UA neg 10/14    Latest Ref Rng & Units 05/18/2024    6:07 AM 05/17/2024    1:58 AM 05/16/2024    3:12 AM  CBC  WBC 4.0 - 10.5 K/uL 15.2  14.1  13.3   Hemoglobin 12.0 - 15.0 g/dL 86.0  86.8  87.6   Hematocrit 36.0 - 46.0 % 40.2  38.7  36.5   Platelets 150 - 400 K/uL 332  313  277       13. HTN: Stable, no meds.  Long-term BP goal normotensive Vitals:   05/17/24 2020 05/18/24 0319  BP:  (!) 140/83  Pulse:  (!) 57  Resp:  16  Temp:  (!) 97.5 F (36.4 C)  SpO2: 96% 98%      14.  HLD: LDL 23 on Crestor  5 mg   15.  T2DM:  A1c 7.4.  Continue CBG monitoring AC/HS with SSI.              - NovoLog  SSI, add Semglee 15 units daily, may need to adjust if am CBGs remain elevated   CBG (last 3)  Recent Labs    05/17/24 1211 05/17/24 1712 05/17/24 2047  GLUCAP 187* 242* 221*    16.  Tobacco abuse: Smoking cessation also provided.   -Nicotine patch      LOS: 1 days A FACE TO FACE EVALUATION WAS PERFORMED  Prentice FORBES Compton 05/18/2024, 7:32 AM

## 2024-05-18 NOTE — Progress Notes (Signed)
 Inpatient Rehabilitation  Patient information reviewed and entered into eRehab system by [facility]. Karen Kays., CCC/SLP, PPS Coordinator.  Information including medical coding, functional ability and quality indicators will be reviewed and updated through discharge.

## 2024-05-18 NOTE — Progress Notes (Signed)
 Inpatient Rehabilitation Care Coordinator Assessment and Plan Patient Details  Name: [name] MRN: [id number] Date of Birth: 1950-09-13  Today's Date: 05/18/2024  Hospital Problems: Principal Problem:   CVA (cerebral vascular accident) [facility])  Past Medical History:  Past Medical History:  Diagnosis Date   Alcohol abuse, in remission    Anxiety    Cancer of sigmoid colon (HCC) 07/2005   [id number]   Chronic bronchitis    COPD (chronic obstructive pulmonary disease) (HCC)    Depression    Diabetes mellitus without complication (HCC)    GERD (gastroesophageal reflux disease)    History of thyroid  nodule    monitoring   Hyperlipidemia    Hypertension    Migraines    OA (osteoarthritis)    PONV (postoperative nausea and vomiting)    Reflux    Past Surgical History:  Past Surgical History:  Procedure Laterality Date   ABDOMINAL EXPOSURE N/A 11/24/2016   Procedure: ABDOMINAL EXPOSURE;  Surgeon: Krystal JULIANNA Doing, MD;  Location: [facility] OR;  Service: Vascular;  Laterality: N/A;   ABDOMINAL HYSTERECTOMY  1992   age 14 and appendics   ANTERIOR LUMBAR FUSION Bilateral 11/24/2016   Procedure: LUMBAR 5-SACRUM 1 ANTERIOR LUMBAR INTERBODY FUSION;  Surgeon: Oneil Priestly, MD;  Location: [facility] OR;  Service: Orthopedics;  Laterality: Bilateral;  LUMBAR 5-SACRUM 1 ANTERIOR LUMBAR INTERBODY FUSION    ARTHRODESIS METATARSALPHALANGEAL JOINT (MTPJ) Left 08/15/2023   Procedure: ARTHRODESIS METATARSALPHALANGEAL JOINT (MTPJ);  Surgeon: Tobie Franky SQUIBB, DPM;  Location: [facility] ORS;  Service: Orthopedics/Podiatry;  Laterality: Left;  POPLITEAL/SAPHENOUS BLOCK   bilateral breast surgery      benign tumors removed   CHOLECYSTECTOMY  2007   COLON RESECTION  2006   COLONOSCOPY  2019   HAND SURGERY  2010   repair of incisional hernia  2012   SHOULDER ARTHROSCOPY Left 2009   SHOULDER SURGERY Right 1997   rotator cuff   TONSILLECTOMY     as a child   TOTAL KNEE ARTHROPLASTY Left 08/11/2021   Procedure: TOTAL  KNEE ARTHROPLASTY;  Surgeon: Beverley Evalene JONETTA, MD;  Location: [facility] ORS;  Service: Orthopedics;  Laterality: Left;   TOTAL KNEE ARTHROPLASTY Right 06/29/2022   Procedure: TOTAL KNEE ARTHROPLASTY;  Surgeon: Beverley Evalene JONETTA, MD;  Location: [facility] ORS;  Service: Orthopedics;  Laterality: Right;   VIDEO BRONCHOSCOPY WITH ENDOBRONCHIAL NAVIGATION N/A 04/23/2024   Procedure: VIDEO BRONCHOSCOPY WITH ENDOBRONCHIAL NAVIGATION;  Surgeon: Shelah Lamar RAMAN, MD;  Location: [facility] ENDOSCOPY;  Service: Pulmonary;  Laterality: N/A;  lingular nodule   Social History:  reports that she has been smoking cigarettes and e-cigarettes. She started smoking about 54 years ago. She has a 41.1 pack-year smoking history. She has never used smokeless tobacco. She reports that she does not drink alcohol and does not use drugs.  Family / Support Systems Marital Status: Single Patient Roles: Partner, Other (Comment) (neighbors/friends) Spouse/Significant Other: [phone] Other Supports: sister is in a [facility] Anticipated Caregiver: Dawn Ability/Limitations of Caregiver: Stephane has MS and has to be careful but she is independent and can assist all but cooking Caregiver Availability: 24/7 Family Dynamics: Close with sister and freinds, she does a lot for others and now it is time for her to receive the support. She feels she has good supports.  Social History Preferred language: English Religion: [employer] Cultural Background: NA Education: HS Health Literacy - How often do you need to have someone help you when you read instructions, pamphlets, or other written material from your doctor or pharmacy?:  Never Writes: Yes Employment Status: Retired Date Retired/Disabled/Unemployed: Does part time Physiological scientist Issues: NA Guardian/Conservator: None-according to MD pt is capable of making her own decisions while here   Abuse/Neglect Abuse/Neglect Assessment Can Be Completed: Yes Physical Abuse:  Denies Verbal Abuse: Denies Sexual Abuse: Denies Exploitation of patient/patient's resources: Denies Self-Neglect: Denies  Patient response to: Social Isolation - How often do you feel lonely or isolated from those around you?: Never  Emotional Status Pt's affect, behavior and adjustment status: Pt is motivated to do well and then have her oncology appointment it keeps getting re-scheduled. She found out she has lung cancer and wants to know the treatment options. She was independent and cared for self and assisted others Recent Psychosocial Issues: other health issues-recently diagnosed with stage 1 lung cancer. She has colon cancer 17 year ago. Psychiatric History: Hx-anxiety takes medications for this and finds them helpful. Have placed on neuro-psych list to be seen with all going on with her medically. Substance Abuse History: Tobacco aware needs to quit especially now with lung cancer diagnosis  Patient / Family Perceptions, Expectations & Goals Pt/Family understanding of illness & functional limitations: Pt is able to explain her stroke and deficits as a result she has good movement. She does talk with the MD's involved and feels has a good understanding of her treatment plan moving forward. Premorbid pt/family roles/activities: partner, sibling, neighbor, friend, Airline pilot, etc Anticipated changes in roles/activities/participation: resume Pt/family expectations/goals: Pt states:  I want to be able to do for myself when I leave here, I'm not one to rely on others.  Community Resources [employer]: None Premorbid Home Care/DME Agencies: None Transportation available at discharge: self and Dawn does drive Is the patient able to respond to transportation needs?: Yes In the past 12 months, has lack of transportation kept you from medical appointments or from getting medications?: No In the past 12 months, has lack of transportation kept you from meetings, work, or from getting  things needed for daily living?: No Resource referrals recommended: Neuropsychology  Discharge Planning Living Arrangements: Spouse/significant other Support Systems: Spouse/significant other, Friends/neighbors, Other relatives Type of Residence: Private residence Insurance Resources: Media planner (specify) Theatre manager Medicare) Surveyor, quantity Resources: Employment, Tree surgeon, Family Support Financial Screen Referred: No Living Expenses: Own Money Management: Patient, Significant Other Does the patient have any problems obtaining your medications?: No Home Management: both Patient/Family Preliminary Plans: Return home with Dawn who is able to assist if needed. Pt wants to recover and move on the the treatment she has for her lung cancer. Aware being evaluated today and goals being set for stay here. Care Coordinator Barriers to Discharge: Insurance for SNF coverage Care Coordinator Anticipated Follow Up Needs: HH/OP  Clinical Impression Pleasant female who was very independent and active prior to admission. She is used to be the caregiver not the patient. Wants to get home so can find out her options for her cancer. She has good supports. Being evaluated today and goals being set. Placed on neuro-psych to be seen early next week.  Raymonde Asberry MATSU 05/18/2024, 10:52 AM

## 2024-05-18 NOTE — Evaluation (Incomplete)
 Physical Therapy Assessment and Plan  Patient Details  Name: [name] MRN: [id number] Date of Birth: 1950-12-02  PT Diagnosis: {diagnoses:3041673} Rehab Potential: Excellent ELOS: 7-10 days   {CHL IP REHAB PT TIME CALCULATION:304800500}   Hospital Problem: Principal Problem:   CVA (cerebral vascular accident) [facility])   Past Medical History:  Past Medical History:  Diagnosis Date   Alcohol abuse, in remission    Anxiety    Cancer of sigmoid colon (HCC) 07/2005   T2N0M0   Chronic bronchitis    COPD (chronic obstructive pulmonary disease) (HCC)    Depression    Diabetes mellitus without complication (HCC)    GERD (gastroesophageal reflux disease)    History of thyroid  nodule    monitoring   Hyperlipidemia    Hypertension    Migraines    OA (osteoarthritis)    PONV (postoperative nausea and vomiting)    Reflux    Past Surgical History:  Past Surgical History:  Procedure Laterality Date   ABDOMINAL EXPOSURE N/A 11/24/2016   Procedure: ABDOMINAL EXPOSURE;  Surgeon: Krystal JULIANNA Doing, MD;  Location: [facility] OR;  Service: Vascular;  Laterality: N/A;   ABDOMINAL HYSTERECTOMY  1992   age 16 and appendics   ANTERIOR LUMBAR FUSION Bilateral 11/24/2016   Procedure: LUMBAR 5-SACRUM 1 ANTERIOR LUMBAR INTERBODY FUSION;  Surgeon: Oneil Priestly, MD;  Location: [facility] OR;  Service: Orthopedics;  Laterality: Bilateral;  LUMBAR 5-SACRUM 1 ANTERIOR LUMBAR INTERBODY FUSION    ARTHRODESIS METATARSALPHALANGEAL JOINT (MTPJ) Left 08/15/2023   Procedure: ARTHRODESIS METATARSALPHALANGEAL JOINT (MTPJ);  Surgeon: Tobie Franky SQUIBB, DPM;  Location: [facility] ORS;  Service: Orthopedics/Podiatry;  Laterality: Left;  POPLITEAL/SAPHENOUS BLOCK   bilateral breast surgery      benign tumors removed   CHOLECYSTECTOMY  2007   COLON RESECTION  2006   COLONOSCOPY  2019   HAND SURGERY  2010   repair of incisional hernia  2012   SHOULDER ARTHROSCOPY Left 2009   SHOULDER SURGERY Right 1997   rotator cuff   TONSILLECTOMY      as a child   TOTAL KNEE ARTHROPLASTY Left 08/11/2021   Procedure: TOTAL KNEE ARTHROPLASTY;  Surgeon: Beverley Evalene JONETTA, MD;  Location: [facility] ORS;  Service: Orthopedics;  Laterality: Left;   TOTAL KNEE ARTHROPLASTY Right 06/29/2022   Procedure: TOTAL KNEE ARTHROPLASTY;  Surgeon: Beverley Evalene JONETTA, MD;  Location: [facility] ORS;  Service: Orthopedics;  Laterality: Right;   VIDEO BRONCHOSCOPY WITH ENDOBRONCHIAL NAVIGATION N/A 04/23/2024   Procedure: VIDEO BRONCHOSCOPY WITH ENDOBRONCHIAL NAVIGATION;  Surgeon: Shelah Lamar RAMAN, MD;  Location: [facility] ENDOSCOPY;  Service: Pulmonary;  Laterality: N/A;  lingular nodule    Assessment & Plan Clinical Impression: Patient is a 73 y.o. year old female with recent admission to the hospital on *** with ***.  Patient transferred to CIR on 05/17/2024 .   Patient currently requires {ONJ:6958369} with mobility secondary to {impairments:3041632}.  Prior to hospitalization, patient was {ONJ:6958369} with mobility and lived with Significant other [facility]) in a House home.  Home access is 1Stairs to enter.  Patient will benefit from skilled PT intervention to {benefits:22816} for planned discharge {planned discharge:3041670}.  Anticipate patient will {follow le:6958327} at discharge.  PT - End of Session Activity Tolerance: Tolerates 30+ min activity with multiple rests Endurance Deficit: Yes PT Assessment Rehab Potential (ACUTE/IP ONLY): Excellent PT Barriers to Discharge: Decreased caregiver support;Home environment access/layout;Lack of/limited family support;Insurance for SNF coverage PT Patient demonstrates impairments in the following area(s): Balance;Endurance;Motor;Pain;Perception;Safety;Sensory PT Transfers Functional Problem(s): Bed Mobility;Bed to Chair;Car;Furniture;Floor PT  Locomotion Functional Problem(s): Ambulation;Stairs PT Plan PT Intensity: Minimum of 1-2 x/day ,45 to 90 minutes PT Frequency: 5 out of 7 days PT Duration Estimated Length of Stay: 7-10  days PT Treatment/Interventions: Ambulation/gait training;Community reintegration;DME/adaptive equipment instruction;Neuromuscular re-education;Psychosocial support;Stair training;UE/LE Strength taining/ROM;UE/LE Coordination activities;Therapeutic Activities;Pain management;Discharge planning;Balance/vestibular training;Cognitive remediation/compensation;Disease management/prevention;Functional mobility training;Patient/family education;Splinting/orthotics;Therapeutic Exercise PT Transfers Anticipated Outcome(s): supervision PT Locomotion Anticipated Outcome(s): supervision PT Recommendation Recommendations for Other Services: Therapeutic Recreation consult Therapeutic Recreation Interventions: Pet therapy;Kitchen group;Stress management Follow Up Recommendations: Home health PT;Outpatient PT;24 hour supervision/assistance Patient destination: Home Equipment Recommended: To be determined   PT Evaluation Precautions/Restrictions Precautions Precautions: Fall Restrictions Weight Bearing Restrictions Per Provider Order: No General   Vital SignsOxygen Therapy O2 Device: Room Air Pain Pain Assessment Pain Scale: 0-10 Pain Score: 0-No pain Faces Pain Scale: No hurt Pain Interference Pain Interference Pain Effect on Sleep: 0. Does not apply - I have not had any pain or hurting in the past 5 days Pain Interference with Therapy Activities: 0. Does not apply - I have not received rehabilitationtherapy in the past 5 days Pain Interference with Day-to-Day Activities: 1. Rarely or not at all Home Living/Prior Functioning Home Living Living Arrangements: Spouse/significant other Available Help at Discharge: Other (Comment) Biomedical engineer and house mate's sister; Fam 24/7 availlable) Type of Home: House Home Access: Stairs to enter [employer] of Steps: 1 Entrance Stairs-Rails: None Home Layout: Two level Alternate Level Stairs-Number of Steps: flight Bathroom Shower/Tub: Architectural technologist: Handicapped height Bathroom Accessibility: Yes Additional Comments: lives with S.O., walk in shower downstairs on main level, has bathtub upstairs  Lives With: Significant other [employer]) Prior Function Level of Independence: Independent with basic ADLs;Independent with gait;Independent with transfers;Independent with homemaking with ambulation  Able to Take Stairs?: Yes Driving: Yes Vocation: Part time employment Vision/Perception  Vision - History Ability to See in Adequate Light: 1 Impaired Vision - Assessment Eye Alignment: Within Functional Limits Ocular Range of Motion: Within Functional Limits Alignment/Gaze Preference: Within Defined Limits Tracking/Visual Pursuits: Able to track stimulus in all quads without difficulty Saccades: Within functional limits Convergence: Within functional limits Perception Perception: Impaired Preception Impairment Details: Inattention/Neglect Perception-Other Comments: L hemibody inattention Praxis Praxis: Impaired Praxis Impairment Details: Motor planning;Organization  Cognition Overall Cognitive Status: Within Functional Limits for tasks assessed Arousal/Alertness: Awake/alert Orientation Level: Oriented X4 Attention: Sustained Sustained Attention: Appears intact Memory: Impaired Memory Impairment: Decreased recall of new information Awareness: Impaired Problem Solving: Impaired Safety/Judgment: Impaired Sensation Sensation Light Touch: Impaired by gross assessment Proprioception: Impaired by gross assessment Coordination Gross Motor Movements are Fluid and Coordinated: No Fine Motor Movements are Fluid and Coordinated: No Coordination and Movement Description: L hemibody coordination deficit with decreased proprioception in L LE Motor  Motor Motor: Hemiplegia Motor - Skilled Clinical Observations: L hemiparesis proximally>distally   Trunk/Postural Assessment  Cervical Assessment Cervical  Assessment: Exceptions to [facility] (forward head) Thoracic Assessment Thoracic Assessment: Exceptions to [facility] (rounded shoulders with mild kyphosis and thoracic shortening on R side 2/2 pelvic instability) Lumbar Assessment Lumbar Assessment: Exceptions to [facility] (posterior pelvic tilt, sitting>standing, R side pelvic elevation/ tilt) Postural Control Postural Control: Deficits on evaluation Righting Reactions: decreased Protective Responses: decreased  Balance Balance Balance Assessed: Yes Static Sitting Balance Static Sitting - Balance Support: Feet supported;Bilateral upper extremity supported Static Sitting - Level of Assistance: 5: Stand by assistance Dynamic Sitting Balance Dynamic Sitting - Balance Support: During functional activity;Feet supported Dynamic Sitting - Level of Assistance: Other (comment) (CGA) Dynamic Sitting - Balance Activities: Forward lean/weight  shifting;Reaching for objects Static Standing Balance Static Standing - Balance Support: During functional activity;Bilateral upper extremity supported Static Standing - Level of Assistance: 4: Min assist Dynamic Standing Balance Dynamic Standing - Balance Support: During functional activity;Bilateral upper extremity supported Dynamic Standing - Level of Assistance: 4: Min assist Dynamic Standing - Balance Activities: Forward lean/weight shifting;Reaching for objects Extremity Assessment      RLE Assessment RLE Assessment: Within Functional Limits [name] Assessment [name] Assessment: Exceptions to [facility]  Care Tool Care Tool Bed Mobility Roll left and right activity   Roll left and right assist level: Contact Guard/Touching assist    Sit to lying activity   Sit to lying assist level: Minimal Assistance - Patient > 75%    Lying to sitting on side of bed activity   Lying to sitting on side of bed assist level: the ability to move from lying on the back to sitting on the side of the bed with no back support.: Minimal  Assistance - Patient > 75%     Care Tool Transfers Sit to stand transfer   Sit to stand assist level: Contact Guard/Touching assist    Chair/bed transfer   Chair/bed transfer assist level: Minimal Assistance - Patient > 75%    Car transfer   Car transfer assist level: Minimal Assistance - Patient > 75%      Care Tool Locomotion Ambulation   Assist level: Minimal Assistance - Patient > 75% Assistive device: Walker-rolling Max distance: 136 ft  Walk 10 feet activity   Assist level: Minimal Assistance - Patient > 75% Assistive device: Walker-rolling   Walk 50 feet with 2 turns activity   Assist level: Minimal Assistance - Patient > 75% Assistive device: Walker-rolling  Walk 150 feet activity Walk 150 feet activity did not occur: Safety/medical concerns      Walk 10 feet on uneven surfaces activity Walk 10 feet on uneven surfaces activity did not occur: Safety/medical concerns      Stairs   Assist level: Minimal Assistance - Patient > 75% Stairs assistive device: 2 hand rails Max number of stairs: 12  Walk up/down 1 step activity   Walk up/down 1 step (curb) assist level: Minimal Assistance - Patient > 75% Walk up/down 1 step or curb assistive device: 2 hand rails  Walk up/down 4 steps activity   Walk up/down 4 steps assist level: Minimal Assistance - Patient > 75% Walk up/down 4 steps assistive device: 2 hand rails  Walk up/down 12 steps activity   Walk up/down 12 steps assist level: Minimal Assistance - Patient > 75% Walk up/down 12 steps assistive device: 2 hand rails  Pick up small objects from floor   Pick up small object from the floor assist level: Moderate Assistance - Patient 50 - 74%    Wheelchair Is the patient using a wheelchair?: Yes (will use during stay but is not expected to use after d/c) Type of Wheelchair: Manual   Wheelchair assist level: Dependent - Patient 0%    Wheel 50 feet with 2 turns activity   Assist Level: Dependent - Patient 0%  Wheel  150 feet activity   Assist Level: Dependent - Patient 0%    Refer to Care Plan for Long Term Goals  SHORT TERM GOAL WEEK 1 PT Short Term Goal 1 (Week 1): STG = LTG d/t ELOS  Recommendations for other services: {RECOMMENDATIONS FOR OTHER SERVICES:3049016}  Skilled Therapeutic Intervention Mobility Bed Mobility Bed Mobility: Supine to Sit;Rolling Right;Rolling Left;Sit to Supine Rolling Right: Contact Guard/Touching  assist Rolling Left: Contact Guard/Touching assist Transfers Transfers: Sit to Stand;Stand to Sit;Stand Pivot Transfers Sit to Stand: Contact Guard/Touching assist Stand to Sit: Contact Guard/Touching assist Stand Pivot Transfers: Minimal Assistance - Patient > 75% Stand Pivot Transfer Details: Verbal cues for gait pattern;Verbal cues for precautions/safety;Verbal cues for safe use of DME/AE;Verbal cues for sequencing Transfer (Assistive device): Rolling walker Locomotion  Gait Ambulation: Yes Gait Assistance: Minimal Assistance - Patient > 75% Gait Distance (Feet): 136 Feet Assistive device: Rolling walker Gait Assistance Details: Verbal cues for precautions/safety;Verbal cues for gait pattern;Verbal cues for safe use of DME/AE Gait Gait: Yes Gait Pattern: Impaired Gait Pattern: Step-to pattern;Decreased step length - right;Decreased stance time - left;Narrow base of support;Trendelenburg Gait velocity: decreased Stairs / Additional Locomotion Stairs: Yes Stairs Assistance: Minimal Assistance - Patient > 75%;Contact Guard/Touching assist Stair Management Technique: Two rails;Step to pattern;Forwards;With walker Number of Stairs: 12 Height of Stairs: 6 Ramp: Minimal Assistance - Patient >75% Curb: Minimal Assistance - Patient >75% Wheelchair Mobility Wheelchair Mobility: No   Discharge Criteria: Patient will be discharged from PT if patient refuses treatment 3 consecutive times without medical reason, if treatment goals not met, if there is a change in  medical status, if patient makes no progress towards goals or if patient is discharged from hospital.  The above assessment, treatment plan, treatment alternatives and goals were discussed and mutually agreed upon: by patient  [name] 05/18/2024, 12:30 PM

## 2024-05-18 NOTE — Progress Notes (Signed)
 Occupational Therapy Session Note  Patient Details  Name: [name] MRN: [id number] Date of Birth: November 06, 1950  Today's Date: 05/18/2024 OT Individual Time: 8583-8496 OT Individual Time Calculation (min): 47 min    Short Term Goals: Week 1:  OT Short Term Goal 1 (Week 1): Pt will complete UB dressing while sitting using hemi dressing techniques with MIN A. OT Short Term Goal 2 (Week 1): Pt will complete LB dressing while sitting using hemi dressing techniques with MIN A. OT Short Term Goal 3 (Week 1): Pt will maintain [name] in safe position when completing functional transfers with MIN verbal cues. OT Short Term Goal 4 (Week 1): Pt will complete toilet transfer utilizing LRAD with SUP with no more than MOD verbal cues.  Skilled Therapeutic Interventions/Progress Updates:  Pt received resting in [facility] presenting to be in good spirits receptive to skilled OT session reporting 0/10 pain- OT offering intermittent rest breaks, repositioning, and therapeutic support to optimize participation in therapy session. Transported Pt to rehab gym. Completed 9 hole peg test with R- 25.67 sec and L-56.24 sec with MIN cues for accuracy. Completed grip strength evaluation via dynamometer with R- 60lbs and L-60lbs. Completed L UE NMRE through towel pushes, shoulder abduction/adduction, and CW/CWW with MIN cues for maintaining control and slowing down to ensure benefit of activity with Pt focusing gaze on [name]. Provided education on fine motor activities to complete in between therapy sessions with small foam blocks and styrofoam cups, provided demonstrated finger-palm and palm-finger translation, pincer and 3-jaw chuch pincer grasp to transfer small blocks into cups. Provided education and demonstrated theraputty exercises with teach back as evidence of learning, provided Pt with 15 beads to add to putty to increase pincer grasp. Donned L WC tray to provide support and increase awareness/attention to [name] to reduce risk  of injury. Transported Pt back to room via WC. [name], seatbelt alarm on, and all needs met.    Therapy Documentation Precautions:  Precautions Precautions: Fall Recall of Precautions/Restrictions: Intact Restrictions Weight Bearing Restrictions Per Provider Order: No    Therapy/Group: Individual Therapy  Paulina Fleeta Dixie 05/18/2024, 3:21 PM

## 2024-05-18 NOTE — Evaluation (Addendum)
 Occupational Therapy Assessment and Plan  Patient Details  Name: [name] MRN: [id number] Date of Birth: 12-02-1950  OT Diagnosis: abnormal posture, ataxia, and muscle weakness (generalized) Rehab Potential: Rehab Potential (ACUTE ONLY): Good ELOS: 2 weeks   Today's Date: 05/18/2024 OT Individual Time: 9266-9162 OT Individual Time Calculation (min): 64 min     Hospital Problem: Principal Problem:   CVA (cerebral vascular accident) (HCC)   Past Medical History:  Past Medical History:  Diagnosis Date   Alcohol abuse, in remission    Anxiety    Cancer of sigmoid colon (HCC) 07/2005   [id number]   Chronic bronchitis    COPD (chronic obstructive pulmonary disease) (HCC)    Depression    Diabetes mellitus without complication (HCC)    GERD (gastroesophageal reflux disease)    History of thyroid  nodule    monitoring   Hyperlipidemia    Hypertension    Migraines    OA (osteoarthritis)    PONV (postoperative nausea and vomiting)    Reflux    Past Surgical History:  Past Surgical History:  Procedure Laterality Date   ABDOMINAL EXPOSURE N/A 11/24/2016   Procedure: ABDOMINAL EXPOSURE;  Surgeon: Krystal JULIANNA Doing, MD;  Location: [facility] OR;  Service: Vascular;  Laterality: N/A;   ABDOMINAL HYSTERECTOMY  1992   age 21 and appendics   ANTERIOR LUMBAR FUSION Bilateral 11/24/2016   Procedure: LUMBAR 5-SACRUM 1 ANTERIOR LUMBAR INTERBODY FUSION;  Surgeon: Oneil Priestly, MD;  Location: [facility] OR;  Service: Orthopedics;  Laterality: Bilateral;  LUMBAR 5-SACRUM 1 ANTERIOR LUMBAR INTERBODY FUSION    ARTHRODESIS METATARSALPHALANGEAL JOINT (MTPJ) Left 08/15/2023   Procedure: ARTHRODESIS METATARSALPHALANGEAL JOINT (MTPJ);  Surgeon: Tobie Franky SQUIBB, DPM;  Location: [facility] ORS;  Service: Orthopedics/Podiatry;  Laterality: Left;  POPLITEAL/SAPHENOUS BLOCK   bilateral breast surgery      benign tumors removed   CHOLECYSTECTOMY  2007   COLON RESECTION  2006   COLONOSCOPY  2019   HAND SURGERY  2010   repair  of incisional hernia  2012   SHOULDER ARTHROSCOPY Left 2009   SHOULDER SURGERY Right 1997   rotator cuff   TONSILLECTOMY     as a child   TOTAL KNEE ARTHROPLASTY Left 08/11/2021   Procedure: TOTAL KNEE ARTHROPLASTY;  Surgeon: Beverley Evalene JONETTA, MD;  Location: [facility] ORS;  Service: Orthopedics;  Laterality: Left;   TOTAL KNEE ARTHROPLASTY Right 06/29/2022   Procedure: TOTAL KNEE ARTHROPLASTY;  Surgeon: Beverley Evalene JONETTA, MD;  Location: [facility] ORS;  Service: Orthopedics;  Laterality: Right;   VIDEO BRONCHOSCOPY WITH ENDOBRONCHIAL NAVIGATION N/A 04/23/2024   Procedure: VIDEO BRONCHOSCOPY WITH ENDOBRONCHIAL NAVIGATION;  Surgeon: Shelah Lamar RAMAN, MD;  Location: [facility] ENDOSCOPY;  Service: Pulmonary;  Laterality: N/A;  lingular nodule    Assessment & Plan Clinical Impression: Dashawna Delbridge Mullings is a 73 year old female with past medical history of tobacco abuse, squamous cell carcinoma of the lung (newly diagnosed 04/2024), COPD, coronary artery calcification, depression, type 2 diabetes, hypertension, migraines who presented to [facility] emergency department on 05/11/2024, with a sudden onset of shortness of breath. The patient had previously used BC powder for a headache and believed she was experiencing an allergic reaction. EMS administered Solu-Medrol , two rounds of epinephrine , and Zofran . In the emergency department, she was tachypneic but maintained oxygen  saturation at 96% on six liters of oxygen . A code stroke was activated due to left neglect and hemiparesis. She was not eligible for TNK treatment as she was outside the treatment window. ED labs showed a D-dimer  of 7.84, ABG with a pH of 7.31, pCO2 of 49, and pO2 of 88. A CT angiogram was negative for pulmonary embolism, and a CT head was also negative. CTA of the head and neck indicated a large vessel occlusion, but CT perfusion showed no evidence of ischemia.  Neurology was consulted and a stroke workup was completed. An MRI confirmed infarcts in the right  MCA, PCA, and ACA territories. Recommendations were made for a loop recorder to rule out a. Fib, she was placed on daily Plavix, as she is allergic to aspirin . The MRI did not reveal any brain metastases. The echo showed an ejection fraction of 65%, and her HbA1c was 7.4. Her hospital course was complicated by acute kidney injury, but she received IV fluid hydration and had mild hypokalemia, which was treated with potassium replacement. Prior to arrival, the patient was independent, driving, and did not use mobility devices. She resides in a two-level home with her significant other and currently requires moderate assistance with a roller walker for functional mobility and transfers. Therapy evaluations completed due to patient decreased functional mobility was admitted for a comprehensive rehab program. Patient transferred to CIR on 05/17/2024 .    Patient currently requires mod with basic self-care skills secondary to muscle weakness, decreased cardiorespiratoy endurance, impaired timing and sequencing, abnormal tone, unbalanced muscle activation, ataxia, decreased coordination, and decreased motor planning, decreased midline orientation, decreased attention to left, and decreased motor planning, decreased attention, decreased awareness, decreased problem solving, decreased safety awareness, decreased memory, and delayed processing, central origin, and decreased sitting balance, decreased standing balance, decreased postural control, hemiplegia, and decreased balance strategies.  Prior to hospitalization, patient could complete ADLs/IADLs with independent .  Patient will benefit from skilled intervention to decrease level of assist with basic self-care skills, increase independence with basic self-care skills, and increase level of independence with iADL prior to discharge home with care partner.  Anticipate patient will require 24 hour supervision and follow up outpatient.  OT - End of Session Activity  Tolerance: Decreased this session Endurance Deficit: Yes OT Assessment Rehab Potential (ACUTE ONLY): Good OT Barriers to Discharge: None OT Patient demonstrates impairments in the following area(s): Balance;Perception;Safety;Sensory;Cognition;Skin Integrity;Endurance;Motor;Pain;Edema OT Basic ADL's Functional Problem(s): Bathing;Dressing;Toileting;Grooming OT Advanced ADL's Functional Problem(s): Simple Meal Preparation OT Transfers Functional Problem(s): Toilet;Tub/Shower OT Additional Impairment(s): Fuctional Use of Upper Extremity OT Plan OT Intensity: Minimum of 1-2 x/day, 45 to 90 minutes OT Frequency: 5 out of 7 days OT Duration/Estimated Length of Stay: 2 weeks OT Treatment/Interventions: Balance/vestibular training;Disease mangement/prevention;Neuromuscular re-education;Self Care/advanced ADL retraining;Therapeutic Exercise;UE/LE Strength taining/ROM;Skin care/wound managment;Pain management;DME/adaptive equipment instruction;Community reintegration;Functional electrical stimulation;Patient/family education;Splinting/orthotics;UE/LE Coordination activities;Discharge planning;Functional mobility training;Psychosocial support;Therapeutic Activities;Visual/perceptual remediation/compensation;Cognitive remediation/compensation OT Self Feeding Anticipated Outcome(s): MOD I OT Basic Self-Care Anticipated Outcome(s): MOD I OT Toileting Anticipated Outcome(s): MOD I OT Bathroom Transfers Anticipated Outcome(s): SUP OT Recommendation Recommendations for Other Services: Therapeutic Recreation consult Therapeutic Recreation Interventions: Pet therapy;Stress management;Outing/community reintergration;Other (comment) (Dance group) Patient destination: Home Follow Up Recommendations: 24 hour supervision/assistance;Outpatient OT Equipment Recommended: To be determined  OT Evaluation Skilled Therapeutic Interventions: Pt received resting in bed presenting to be in good spirits receptive to  skilled OT session reporting 0/10 pain- OT offering intermittent rest breaks, repositioning, and therapeutic support to optimize participation in therapy session. Focused this session on completing initial evaluation and morning routine ADLs. Completed functional mobility utilizing Rw from EOB to bathroom. Pt with continent urine void, completed 3/3 toileting tasks with CGA assist. Completed functional mobility to [facility] in room with  MIN A with no AD. Pt completed U/LB bathing, dressing, oral care, and grooming while sitting in WC at the sink with assist levels listed below. Provided education and MOD verbal cues throughout the session for safety, body awareness, and motor planning. [name], seatbelt alarm on, and all needs met.    Precautions/Restrictions  Precautions Precautions: Fall Recall of Precautions/Restrictions: Intact Restrictions Weight Bearing Restrictions Per Provider Order: No Pain Pain Assessment Pain Scale: 0-10 Pain Score: 0-No pain Home Living/Prior Functioning Home Living Family/patient expects to be discharged to:: Private residence Living Arrangements: Spouse/significant other Available Help at Discharge: Other (Comment) Biomedical engineer and house mate's sister; Fam 24/7 availlable) Type of Home: House Home Access: Stairs to enter [employer] of Steps: 1 Entrance Stairs-Rails: None Home Layout: Two level Alternate Level Stairs-Number of Steps: flight Bathroom Shower/Tub: Health visitor: Handicapped height Bathroom Accessibility: Yes Additional Comments: lives with house mate, walk in shower downstairs on main level, has bathtub upstairs  Lives With: Other (Comment) (House Mate) IADL History Homemaking Responsibilities: Yes Meal Prep Responsibility: Primary Laundry Responsibility: Primary Cleaning Responsibility: Primary Bill Paying/Finance Responsibility: Primary Shopping Responsibility:  Primary Child Care Responsibility: Primary Homemaking Comments: Her house mate helps out, however she has MS Current License: Yes Mode of Transportation: Car Education: Some college Occupation: Part time employment Type of Occupation: Retired from [employer]; Now she does pet care part time Leisure and Hobbies: Enjoys animals and spening time outside Prior Function Level of Independence: Independent with basic ADLs, Independent with gait, Independent with transfers, Independent with homemaking with ambulation  Able to Take Stairs?: Yes Driving: Yes Vocation: Part time employment Vision Baseline Vision/History: 1 Wears glasses (readers) Ability to See in Adequate Light: 1 Impaired Patient Visual Report: No change from baseline Vision Assessment?: Yes Eye Alignment: Within Functional Limits Ocular Range of Motion: Within Functional Limits (baseline weakness in R eye per Pt report) Alignment/Gaze Preference: Within Defined Limits Tracking/Visual Pursuits: Able to track stimulus in all quads without difficulty Saccades: Within functional limits Convergence: Within functional limits Visual Fields: No apparent deficits Perception  Perception: Impaired (L inattention) Perception-Other Comments: L hemibody inattention Praxis Praxis: Impaired Praxis Impairment Details: Motor planning;Organization Cognition Cognition Overall Cognitive Status: Within Functional Limits for tasks assessed Arousal/Alertness: Awake/alert Orientation Level: Person;Place;Situation Person: Oriented Place: Oriented Situation: Oriented Memory: Impaired Memory Impairment: Decreased recall of new information Attention: Sustained Sustained Attention: Appears intact Awareness: Impaired Awareness Impairment: Anticipatory impairment Problem Solving: Impaired Problem Solving Impairment: Functional basic Safety/Judgment: Impaired (mild impairment) Brief Interview for Mental Status (BIMS) Repetition of  Three Words (First Attempt): 3 Temporal Orientation: Year: Correct Temporal Orientation: Month: Accurate within 5 days Temporal Orientation: Day: Correct Recall: Sock: Yes, no cue required Recall: Blue: Yes, no cue required Recall: Bed: Yes, no cue required BIMS Summary Score: 15 Sensation Sensation Light Touch: Impaired by gross assessment Hot/Cold: Not tested Proprioception: Impaired by gross assessment Stereognosis: Not tested Coordination Gross Motor Movements are Fluid and Coordinated: No Fine Motor Movements are Fluid and Coordinated: No Coordination and Movement Description: Mild L hemibody coordination deficit with decreased proprioception and ataxia noted in L LE Finger Nose Finger Test: mild slowed on L side with compensatory shoulder elevation notes Motor  Motor Motor: Hemiplegia;Ataxia Motor - Skilled Clinical Observations: Mild L hemiparesis proximally>distally  Trunk/Postural Assessment  Cervical Assessment Cervical Assessment: Exceptions to [facility] (forward head) Thoracic Assessment Thoracic Assessment: Exceptions to [facility] (rounded shoulders, thoracic shortening on R side 2/2 pelvic instability)  Lumbar Assessment Lumbar Assessment: Exceptions to [facility] (posterior pelvic tilt, sitting> standing, R side pelvic elevation/tilt) Postural Control Postural Control: Deficits on evaluation Righting Reactions: decreased Protective Responses: decreased  Balance Balance Balance Assessed: Yes Static Sitting Balance Static Sitting - Balance Support: Feet supported;Bilateral upper extremity supported Static Sitting - Level of Assistance: 5: Stand by assistance Dynamic Sitting Balance Dynamic Sitting - Balance Support: During functional activity;Feet unsupported Dynamic Sitting - Level of Assistance: 4: Min assist (CGA-MIN A) Dynamic Sitting - Balance Activities: Forward lean/weight shifting;Reaching for objects Sitting balance - Comments: ADLs Static Standing Balance Static  Standing - Balance Support: During functional activity;Bilateral upper extremity supported Static Standing - Level of Assistance: 4: Min assist (CGA-MIN A) Dynamic Standing Balance Dynamic Standing - Balance Support: During functional activity;Bilateral upper extremity supported Dynamic Standing - Level of Assistance: 4: Min assist Dynamic Standing - Balance Activities: Forward lean/weight shifting;Reaching for objects (ADLs) Extremity/Trunk Assessment RUE Assessment RUE Assessment: Within Functional Limits General Strength Comments: 4/5 overall with mild weakness 2/2 deconditioning [name] Assessment [name] Assessment: Exceptions to [facility] General Strength Comments: 3/5 with proximal strength deficit noted at shoulder, 3+/5 distally  Care Tool Care Tool Self Care Eating   Eating Assist Level: Set up assist    Oral Care    Oral Care Assist Level: Set up assist    Bathing   Body parts bathed by patient: Right arm;Left arm;Chest;Abdomen;Front perineal area;Buttocks;Right upper leg;Left upper leg;Face;Right lower leg;Left lower leg Body parts bathed by helper: Right lower leg;Left lower leg   Assist Level: Moderate Assistance - Patient 50 - 74%    Upper Body Dressing(including orthotics)   What is the patient wearing?: Pull over shirt   Assist Level: Moderate Assistance - Patient 50 - 74%    Lower Body Dressing (excluding footwear)   What is the patient wearing?: Pants;Underwear/pull up Assist for lower body dressing: Moderate Assistance - Patient 50 - 74%    Putting on/Taking off footwear   What is the patient wearing?: Socks;Shoes Assist for footwear: Moderate Assistance - Patient 50 - 74%       Care Tool Toileting Toileting activity   Assist for toileting: Contact Guard/Touching assist     Care Tool Bed Mobility Roll left and right activity   Roll left and right assist level: Contact Guard/Touching assist    Sit to lying activity   Sit to lying assist level: Minimal  Assistance - Patient > 75%    Lying to sitting on side of bed activity   Lying to sitting on side of bed assist level: the ability to move from lying on the back to sitting on the side of the bed with no back support.: Minimal Assistance - Patient > 75%     Care Tool Transfers Sit to stand transfer   Sit to stand assist level: Contact Guard/Touching assist    Chair/bed transfer   Chair/bed transfer assist level: Contact Guard/Touching assist     Toilet transfer   Assist Level: Contact Guard/Touching assist     Care Tool Cognition  Expression of Ideas and Wants Expression of Ideas and Wants: 4. Without difficulty (complex and basic) - expresses complex messages without difficulty and with speech that is clear and easy to understand  Understanding Verbal and Non-Verbal Content Understanding Verbal and Non-Verbal Content: 4. Understands (complex and basic) - clear comprehension without cues or repetitions   Memory/Recall Ability Memory/Recall Ability : Current season;That he or she is in a hospital/hospital unit   Refer to Care Plan  for Long Term Goals  SHORT TERM GOAL WEEK 1 OT Short Term Goal 1 (Week 1): Pt will complete UB dressing while sitting using hemi dressing techniques with MIN A. OT Short Term Goal 2 (Week 1): Pt will complete LB dressing while sitting using hemi dressing techniques with MIN A. OT Short Term Goal 3 (Week 1): Pt will maintain [name] in safe position when completing functional transfers with MIN verbal cues. OT Short Term Goal 4 (Week 1): Pt will complete toilet transfer utilizing LRAD with SUP with no more than MOD verbal cues.  Recommendations for other services: Therapeutic Recreation  Pet therapy, Stress management, Outing/community reintegration, and Other Dance Group   Skilled Therapeutic Intervention ADL ADL Equipment Provided: Reacher Eating: Set up Where Assessed-Eating: Chair;Edge of bed Grooming: Setup Where Assessed-Grooming: Sitting at  sink Upper Body Bathing: Contact guard Where Assessed-Upper Body Bathing: Standing at sink Lower Body Bathing: Moderate assistance Where Assessed-Lower Body Bathing: Standing at sink Upper Body Dressing: Moderate assistance Where Assessed-Upper Body Dressing: Sitting at sink Lower Body Dressing: Moderate assistance Where Assessed-Lower Body Dressing: Sitting at sink;Standing at sink (stand to pull over hips) Toileting: Contact guard Where Assessed-Toileting: Teacher, adult education: Furniture conservator/restorer Method: Ambulating (RW) Acupuncturist: Engineer, technical sales: Not assessed Film/video editor: Insurance underwriter Method: Ambulating (RW) Astronomer: Event organiser  Bed Mobility Bed Mobility: Supine to Sit;Rolling Right;Rolling Left;Sit to Supine Rolling Right: Contact Guard/Touching assist Rolling Left: Contact Guard/Touching assist Supine to Sit: Minimal Assistance - Patient > 75% (using bed rail) Sit to Supine: Minimal Assistance - Patient > 75% Transfers Sit to Stand: Minimal Assistance - Patient > 75% Stand to Sit: Minimal Assistance - Patient > 75%   Discharge Criteria: Patient will be discharged from OT if patient refuses treatment 3 consecutive times without medical reason, if treatment goals not met, if there is a change in medical status, if patient makes no progress towards goals or if patient is discharged from hospital.  The above assessment, treatment plan, treatment alternatives and goals were discussed and mutually agreed upon: by patient  Paulina Fleeta Dixie 05/18/2024, 8:34 AM

## 2024-05-18 NOTE — Progress Notes (Signed)
 Skin tear on L upper arm measurement: Length: 1cm Width : 0.8 cm Depth : 0.1cm Applied xerofoam and foam per order.   Amado GORMAN Arabia, RN

## 2024-05-19 DIAGNOSIS — E1165 Type 2 diabetes mellitus with hyperglycemia: Secondary | ICD-10-CM | POA: Diagnosis not present

## 2024-05-19 DIAGNOSIS — I63411 Cerebral infarction due to embolism of right middle cerebral artery: Secondary | ICD-10-CM | POA: Diagnosis not present

## 2024-05-19 DIAGNOSIS — I10 Essential (primary) hypertension: Secondary | ICD-10-CM | POA: Diagnosis not present

## 2024-05-19 DIAGNOSIS — Z794 Long term (current) use of insulin: Secondary | ICD-10-CM | POA: Diagnosis not present

## 2024-05-19 LAB — GLUCOSE, CAPILLARY
Glucose-Capillary: 117 mg/dL — ABNORMAL HIGH (ref 70–99)
Glucose-Capillary: 181 mg/dL — ABNORMAL HIGH (ref 70–99)
Glucose-Capillary: 161 mg/dL — ABNORMAL HIGH (ref 70–99)
Glucose-Capillary: 173 mg/dL — ABNORMAL HIGH (ref 70–99)

## 2024-05-19 MED ORDER — INSULIN GLARGINE-YFGN 100 UNIT/ML ~~LOC~~ SOLN
18.0000 [IU] | Freq: Every day | SUBCUTANEOUS | Status: DC
  Administered 2024-05-20 – 2024-05-21 (×2): 18 [IU] via SUBCUTANEOUS
  Filled 2024-05-19 (×2): qty 0.18

## 2024-05-19 NOTE — Progress Notes (Signed)
 Physical Therapy Session Note  Patient Details  Name: [name] MRN: [id number] Date of Birth: [birth year]  Today's Date: 05/18/2024 PT Individual Time: 8697-8653 PT Individual Time Calculation (min): 44 min  Short Term Goals: Week 1:  PT Short Term Goal 1 (Week 1): Pt will demonstrate bed mobility with consistent SBA to bed setup as per home environment. PT Short Term Goal 2 (Week 1): Pt will demonstrate sti<>stands with increased control and consistent supervision. PT Short Term Goal 3 (Week 1): Pt will demonstrate stand pivot transfers with consistent CGA. PT Short Term Goal 4 (Week 1): Pt will ambulate more than 167ft consistently with overall CGA. PT Short Term Goal 5 (Week 1): Pt will complete flight of steps with CGA.  Skilled Therapeutic Interventions/Progress Updates:  Patient seated upright in w/c on entrance to room. Patient alert and agreeable to PT session.   Patient with no pain complaint at start of session.  Relates need to toilet prior to leaving room and ambulates to bathroom using RW with L lean and catching L foot outside of RW during turn into bathroom. Unable to correct without vc to direct focus to [name]. Pt is impulsive with movements prior to full understanding of predicament. Is able to doff pants with supervision. Toilets and performs pericare with supervision. Requires CGA for balance with donning pants. One LOB with pt grasping safety rail to catch self.    Pt taken to ADL apartment to assess bed mobility in taller bed height as pt relates she has a 4-poster bed. Demos ability to sit to edge and with some difficulty and extra effort can bring BLE to bed surface with CGA. Exits with SBA. Demonstrated potential need for rail on bedside.   Gait Training:  Pt ambulated 120 ft using RW with CGA/ MinA for balance. Demonstrated variable step height/ length and lean to L especially with fatigue. Cues provided to correct to upright posture and increased awareness of  [name] muscle activation and positioning.   Neuromuscular Re-ed: NMR facilitated during session with focus on standing balance. Pt guided in Berg Balance Assessment. Patient demonstrates increased fall risk as noted by score of  24 /56 on Berg Balance Scale.  (<36= high risk for falls, close to 100%; 37-45 significant >80%; 46-51 moderate >50%; 52-55 lower >25%) Demonstrated increased difficulty and requiring intervention for NBOS, functional reach and more dynamic balance tasks.   NMR performed for improvements in motor control and coordination, balance, sequencing, judgement, and self confidence/ efficacy in performing all aspects of mobility at highest level of independence.   Patient seated upright in w/c at end of session with brakes locked, belt alarm set, and all needs within reach. Oriented to toime and time of next session with OT.   Therapy Documentation Precautions:  Precautions Precautions: Fall Recall of Precautions/Restrictions: Intact Restrictions Weight Bearing Restrictions Per Provider Order: No  Pain:  No pain related this session.   Therapy/Group: Individual Therapy  Mliss DELENA Milliner PT, DPT, CSRS 05/18/2024, 4:34 PM

## 2024-05-19 NOTE — Plan of Care (Signed)
  Problem: Consults Goal: RH STROKE PATIENT EDUCATION Description: See Patient Education module for education specifics  Outcome: Progressing   Problem: RH BOWEL ELIMINATION Goal: RH STG MANAGE BOWEL WITH ASSISTANCE Description: STG Manage Bowel with mod I Assistance. Outcome: Progressing Goal: RH STG MANAGE BOWEL W/MEDICATION W/ASSISTANCE Description: STG Manage Bowel with Medication with  mod I Assistance. Outcome: Progressing   Problem: RH SAFETY Goal: RH STG ADHERE TO SAFETY PRECAUTIONS W/ASSISTANCE/DEVICE Description: STG Adhere to Safety Precautions With cues  Assistance/Device. Outcome: Progressing   Problem: RH PAIN MANAGEMENT Goal: RH STG PAIN MANAGED AT OR BELOW PT'S PAIN GOAL Description: PAin < 4 with prns Outcome: Progressing   Problem: RH KNOWLEDGE DEFICIT Goal: RH STG INCREASE KNOWLEDGE OF DIABETES Description: Patient and S.O. will be able to manage DM using educational resources for medications and dietary modification independnetly Outcome: Progressing Goal: RH STG INCREASE KNOWLEDGE OF HYPERTENSION Description: Patient and S.O. will be able to manage HTN using educational resources for medications and dietary modification independnetly Outcome: Progressing Goal: RH STG INCREASE KNOWLEGDE OF HYPERLIPIDEMIA Description: Patient and S.O. will be able to manage HLD using educational resources for medications and dietary modification independnetly Outcome: Progressing Goal: RH STG INCREASE KNOWLEDGE OF STROKE PROPHYLAXIS Description: Patient and S.O. will be able to manage secondary risks using educational resources for medications and dietary modification independnetly Outcome: Progressing

## 2024-05-19 NOTE — Progress Notes (Signed)
 PROGRESS NOTE   Subjective/Complaints:  Pt without complaints  ROS: Patient denies fever, rash, sore throat, blurred vision, dizziness, nausea, vomiting, diarrhea, cough, shortness of breath or chest pain, joint or back/neck pain, headache, or mood change.    Objective:   EP PPM/ICD IMPLANT Result Date: 05/17/2024 SURGEON:  Will Gladis Norton, MD   PREPROCEDURE DIAGNOSIS:  Cryptogenic stroke   POSTPROCEDURE DIAGNOSIS: Cryptogenic stroke    PROCEDURES:  1. Implantable loop recorder implantation   INTRODUCTION:  [name] presents with a history of cryptogenic stroke The costs of loop recorder monitoring have been discussed with the patient. Appropriate time out was performed prior to the procedure.   DESCRIPTION OF PROCEDURE:  Informed written consent was obtained.  The patient required no sedation for the procedure today.  Mapping over the patient's chest was performed to identify the area where electrograms were most prominent for ILR recording.  This area was found to be the left parasternal region over the 4th intercostal space. The patients left chest was therefore prepped and draped in the usual sterile fashion. The skin overlying the left parasternal region was infiltrated with lidocaine  for local analgesia.  A 0.5-cm incision was made over the left parasternal region over the 3rd intercostal space.  A subcutaneous ILR pocket was fashioned using a combination of sharp and blunt dissection.  A Med[employer]onic Reveal LINQ 2 (serial # [id number]) implantable loop recorder was then placed into the pocket  R waves were very prominent and measured 0.38mV.  Steri- Strips and a sterile dressing were then applied.  There were no early apparent complications.   CONCLUSIONS:  1. Successful implantation of a implantable loop recorder for a history of cryptogenic stroke  2. No early apparent complications. Will Gladis Norton, MD 05/17/2024 3:07 PM    Recent Labs    05/17/24 0158 05/18/24 0607  WBC 14.1* 15.2*  HGB 13.1 13.9  HCT 38.7 40.2  PLT 313 332   Recent Labs    05/17/24 0158 05/18/24 0607  NA 136 138  K 3.6 3.9  CL 98 101  CO2 23 26  GLUCOSE 148* 103*  BUN 30* 26*  CREATININE 0.89 0.83  CALCIUM  8.8* 8.7*    Intake/Output Summary (Last 24 hours) at 05/19/2024 0918 Last data filed at 05/19/2024 0727 Gross per 24 hour  Intake 1015 ml  Output --  Net 1015 ml        Physical Exam: Vital Signs Blood pressure (!) 158/98, pulse 96, temperature (!) 97.5 F (36.4 C), resp. rate 19, height 5' 4 (1.626 m), weight 79.8 kg, SpO2 100%.   Constitutional: No distress . Vital signs reviewed. HEENT: NCAT, EOMI, oral membranes moist Neck: supple Cardiovascular: RRR without murmur. No JVD    Respiratory/Chest: CTA Bilaterally without wheezes or rales. Normal effort    GI/Abdomen: BS +, non-tender, non-distended Ext: no clubbing, cyanosis, or edema Psych: pleasant and cooperative  Skin: No evidence of breakdown, no evidence of rash Neurologic: Cranial nerves II through XII intact, motor strength is 5/5 in right and 4/5 left deltoid, bicep, tricep, grip, hip flexor, knee extensors, ankle dorsiflexor and plantar flexor Fine motor intact BUE Neg dysdiadochokinesis with RAM BUE  Cerebellar exam normal finger to nose to finger Musculoskeletal: Reduced left shoulder flexion and abduction but no pain . No joint swelling   Assessment/Plan: 1. Functional deficits which require 3+ hours per day of interdisciplinary therapy in a comprehensive inpatient rehab setting. Physiatrist is providing close team supervision and 24 hour management of active medical problems listed below. Physiatrist and rehab team continue to assess barriers to discharge/monitor patient progress toward functional and medical goals  Care Tool:  Bathing    Body parts bathed by patient: Right arm, Left arm, Chest, Abdomen, Front perineal area,  Buttocks, Right upper leg, Left upper leg, Face, Right lower leg, Left lower leg   Body parts bathed by helper: Right lower leg, Left lower leg     Bathing assist Assist Level: Moderate Assistance - Patient 50 - 74%     Upper Body Dressing/Undressing Upper body dressing   What is the patient wearing?: Pull over shirt    Upper body assist Assist Level: Moderate Assistance - Patient 50 - 74%    Lower Body Dressing/Undressing Lower body dressing      What is the patient wearing?: Pants, Underwear/pull up     Lower body assist Assist for lower body dressing: Moderate Assistance - Patient 50 - 74%     Toileting Toileting    Toileting assist Assist for toileting: Contact Guard/Touching assist     Transfers Chair/bed transfer  Transfers assist     Chair/bed transfer assist level: Minimal Assistance - Patient > 75%     Locomotion Ambulation   Ambulation assist      Assist level: Minimal Assistance - Patient > 75% Assistive device: Walker-rolling Max distance: 136 ft   Walk 10 feet activity   Assist     Assist level: Minimal Assistance - Patient > 75% Assistive device: Walker-rolling   Walk 50 feet activity   Assist    Assist level: Minimal Assistance - Patient > 75% Assistive device: Walker-rolling    Walk 150 feet activity   Assist Walk 150 feet activity did not occur: Safety/medical concerns         Walk 10 feet on uneven surface  activity   Assist Walk 10 feet on uneven surfaces activity did not occur: Safety/medical concerns         Wheelchair     Assist Is the patient using a wheelchair?: Yes (will use during stay but is not expected to use after d/c) Type of Wheelchair: Manual    Wheelchair assist level: Dependent - Patient 0%      Wheelchair 50 feet with 2 turns activity    Assist        Assist Level: Dependent - Patient 0%   Wheelchair 150 feet activity     Assist      Assist Level: Dependent -  Patient 0%   Blood pressure (!) 158/98, pulse 96, temperature (!) 97.5 F (36.4 C), resp. rate 19, height 5' 4 (1.626 m), weight 79.8 kg, SpO2 100%.    Medical Problem List and Plan: 1. Functional deficits secondary to right MCA, ACA and MCA/PCA infarcts embolic pattern secondary to embolic source              -patient may  shower             -ELOS/Goals: 8 to 13 days, supervision PT, OT              --Continue CIR therapies including PT, OT     2.  Antithrombotics: -DVT/anticoagulation:  Pharmaceutical: Lovenox             -antiplatelet therapy: Plavix (Asprin allergy)   3. Pain Management: Hx chronic pain-Gabapentin  100 mg twice daily.  Tylenol  prn   4. Mood/Behavior/Sleep: LCSW to follow for evaluation and support when available.              -antipsychotic agents: Amitriptyline  100 mg nightly, Abilify  20 mg daily   5. Neuropsych/cognition: This patient IS capable of making decisions on HER own behalf.   6. Skin/Wound Care: Routine pressure relief measures.   7. Fluids/Electrolytes/Nutrition: Monitor I&O, weights.  Labs in a.m.             -GERD: Pepcid     8. Acute multifocal focal infarct: Loop recorder to be placed by EP prior to CIR             - Continue Plavix and statin   9. COPD exacerbation:  Pulmicort and DuoNeb as scheduled. Confirmed with medicine *Prednisone 40 mg until 10/17 followed by 3 days of prednisone at 20 mg daily.             - DuoNeb as needed   10.  Lung CA: Recently diagnosed with lung cancer stage IA.  MRI brain no mets.  Follow-up with oncology   11. AKI: resolved 10/17    Latest Ref Rng & Units 05/18/2024    6:07 AM 05/17/2024    1:58 AM 05/16/2024    3:12 AM  BMP  Glucose 70 - 99 mg/dL 896  851  857   BUN 8 - 23 mg/dL 26  30  31    Creatinine 0.44 - 1.00 mg/dL 9.16  9.10  9.03   Sodium 135 - 145 mmol/L 138  136  137   Potassium 3.5 - 5.1 mmol/L 3.9  3.6  3.8   Chloride 98 - 111 mmol/L 101  98  101   CO2 22 - 32 mmol/L 26  23  26     Calcium  8.9 - 10.3 mg/dL 8.7  8.8  8.9       12. Leukocytosis: reactive secondary to steroids use.  Afebrile  -UA neg 10/14    Latest Ref Rng & Units 05/18/2024    6:07 AM 05/17/2024    1:58 AM 05/16/2024    3:12 AM  CBC  WBC 4.0 - 10.5 K/uL 15.2  14.1  13.3   Hemoglobin 12.0 - 15.0 g/dL 86.0  86.8  87.6   Hematocrit 36.0 - 46.0 % 40.2  38.7  36.5   Platelets 150 - 400 K/uL 332  313  277       13. HTN: Stable, no meds.  Long-term BP goal normotensive Vitals:   05/19/24 0650 05/19/24 0747  BP: (!) 158/98   Pulse: 96   Resp: 19   Temp: (!) 97.5 F (36.4 C)   SpO2: 100% 100%    10/18 bp elevated this am. Had been ok until today--obsv only fornow   14.  HLD: LDL 23 on Crestor  5 mg   15.  T2DM:  A1c 7.4.  Continue CBG monitoring AC/HS with SSI.              - NovoLog  SSI, add Semglee 15 units daily, may need to adjust if am CBGs remain elevated   CBG (last 3)  Recent Labs    05/18/24 1630 05/18/24 2121 05/19/24 0654  GLUCAP 219* 192* 117*    10/18 continued pm elevation. Increase semglee to 18u daily 16.  Tobacco abuse: Smoking cessation also provided.   -Nicotine patch      LOS: 2 days A FACE TO FACE EVALUATION WAS PERFORMED  [name] 05/19/2024, 9:18 AM

## 2024-05-19 NOTE — Progress Notes (Signed)
 Physical Therapy Session Note  Patient Details  Name: [name] MRN: [id number] Date of Birth: 01-Dec-1950  Today's Date: 05/19/2024 PT Individual Time: 0918-1030 PT Individual Time Calculation (min): 72 min   Short Term Goals: Week 1:  PT Short Term Goal 1 (Week 1): Pt will demonstrate bed mobility with consistent SBA to bed setup as per home environment. PT Short Term Goal 2 (Week 1): Pt will demonstrate sti<>stands with increased control and consistent supervision. PT Short Term Goal 3 (Week 1): Pt will demonstrate stand pivot transfers with consistent CGA. PT Short Term Goal 4 (Week 1): Pt will ambulate more than 145ft consistently with overall CGA. PT Short Term Goal 5 (Week 1): Pt will complete flight of steps with CGA.  Skilled Therapeutic Interventions/Progress Updates:  Patient seated upright in w/c on entrance to room. Patient alert and agreeable to PT session. Has dressed UB and requests help to dress LB as she can't stand up d/t safety belt being on.   Patient with no pain complaint at start of session.  Therapeutic Activity: Pt required extensive cueing for safety d/t reduced awareness of impairments.  Transfers: Pt performed sit<>stand and stand pivot transfers throughout session with overall CGA/ MinA. See below for NMR to improve technique.  Gait Training:  t ambulated 120 ft using RW with up to MinA for balance. Demonstrated variable step height/ length and lean to L especially with fatigue. Cues provided to correct to upright posture and increased awareness of [name] muscle activation and positioning. Several instances of L foot placement outside of RW BOS.   Neuromuscular Re-ed: NMR facilitated during session with focus on standing balance, midline orientation, L hemibody awareness. Pt guided in use of 6 step for L toe taps 2x . Then switch to R toe taps with focus on [name] knee stability. Slow melt to L knee flexion.   Guided in sit<>stand technique and need for  increased focus on sequencing forward scoot, posterior feet placement, forward hip hinge and press of forefoot into floor.  Blocked practice to improve from MinA to close supervision with no UE.   Guided in lateral stepping over 6 hurdle and pt requiring vc for foot placement but then unable to clear with [name] as trailing LE. Requires extensive cueing and assist with balance for performance. Cued for successive movements required to obtain weight bearing and single leg balance prior to lift of required foot. Requires blocked practice for mild improvement.   NMR performed for improvements in motor control and coordination, balance, sequencing, judgement, and self confidence/ efficacy in performing all aspects of mobility at highest level of independence.   Patient seated upright in w/c in room at end of session with brakes locked, belt alarm set, and all needs within reach. Reiterated need for staff to assist with any mobility into stance/ ambulation in room.    Therapy Documentation Precautions:  Precautions Precautions: Fall Recall of Precautions/Restrictions: Intact Restrictions Weight Bearing Restrictions Per Provider Order: No General:  Pain: No pain related this session.   Therapy/Group: Individual Therapy  Mliss DELENA Milliner PT, DPT, CSRS 05/19/2024, 12:55 PM

## 2024-05-19 NOTE — Plan of Care (Signed)
  Problem: RH Balance Goal: LTG Patient will maintain dynamic standing balance (PT) Description: LTG:  Patient will maintain dynamic standing balance with assistance during mobility activities (PT) Flowsheets (Taken 05/19/2024 0614) LTG: Pt will maintain dynamic standing balance during mobility activities with:: Supervision/Verbal cueing   Problem: Sit to Stand Goal: LTG:  Patient will perform sit to stand with assistance level (PT) Description: LTG:  Patient will perform sit to stand with assistance level (PT) Flowsheets (Taken 05/19/2024 9385) LTG: PT will perform sit to stand in preparation for functional mobility with assistance level: Independent with assistive device   Problem: RH Bed Mobility Goal: LTG Patient will perform bed mobility with assist (PT) Description: LTG: Patient will perform bed mobility with assistance, with/without cues (PT). Flowsheets (Taken 05/19/2024 [phone]) LTG: Pt will perform bed mobility with assistance level of: Independent with assistive device    Problem: RH Bed to Chair Transfers Goal: LTG Patient will perform bed/chair transfers w/assist (PT) Description: LTG: Patient will perform bed to chair transfers with assistance (PT). Flowsheets (Taken 05/19/2024 0614) LTG: Pt will perform Bed to Chair Transfers with assistance level: Supervision/Verbal cueing   Problem: RH Car Transfers Goal: LTG Patient will perform car transfers with assist (PT) Description: LTG: Patient will perform car transfers with assistance (PT). Flowsheets (Taken 05/19/2024 0614) LTG: Pt will perform car transfers with assist:: Supervision/Verbal cueing   Problem: RH Furniture Transfers Goal: LTG Patient will perform furniture transfers w/assist (OT/PT) Description: LTG: Patient will perform furniture transfers  with assistance (OT/PT). Flowsheets (Taken 05/19/2024 0614) LTG: Pt will perform furniture transfers with assist:: Supervision/Verbal cueing   Problem: RH  Ambulation Goal: LTG Patient will ambulate in controlled environment (PT) Description: LTG: Patient will ambulate in a controlled environment, # of feet with assistance (PT). Flowsheets (Taken 05/19/2024 [phone]) LTG: Pt will ambulate in controlled environ  assist needed:: Supervision/Verbal cueing LTG: Ambulation distance in controlled environment: more than 250 ft using LRAD Goal: LTG Patient will ambulate in home environment (PT) Description: LTG: Patient will ambulate in home environment, # of feet with assistance (PT). Flowsheets (Taken 05/19/2024 [phone]) LTG: Pt will ambulate in home environ  assist needed:: Supervision/Verbal cueing LTG: Ambulation distance in home environment: up to 50 ft using LRAD   Problem: RH Stairs Goal: LTG Patient will ambulate up and down stairs w/assist (PT) Description: LTG: Patient will ambulate up and down # of stairs with assistance (PT) Flowsheets (Taken 05/19/2024 0614) LTG: Pt will ambulate up/down stairs assist needed:: Supervision/Verbal cueing LTG: Pt will  ambulate up and down number of stairs: at least one flight of steps in order to reach BR/ BA on 2nd floor in home

## 2024-05-19 NOTE — Progress Notes (Signed)
 Occupational Therapy Session Note  Patient Details  Name: [name] MRN: [id number] Date of Birth: Nov 03, 1950  Today's Date: 05/19/2024 OT Individual Time: 1100-1154 and 1315-1410 OT Individual Time Calculation (min): 54 min and 55 min   Short Term Goals: Week 1:  OT Short Term Goal 1 (Week 1): Pt will complete UB dressing while sitting using hemi dressing techniques with MIN A. OT Short Term Goal 2 (Week 1): Pt will complete LB dressing while sitting using hemi dressing techniques with MIN A. OT Short Term Goal 3 (Week 1): Pt will maintain [name] in safe position when completing functional transfers with MIN verbal cues. OT Short Term Goal 4 (Week 1): Pt will complete toilet transfer utilizing LRAD with SUP with no more than MOD verbal cues.   Skilled Therapeutic Interventions/Progress Updates:    Session 1: Pt up in wheelchair at time of session, no pain. No ADL need at this time. Pt ambulated room <> ortho gym with RW and CGA and wheelchair follow. In gym, focused on standing balance, L side attention, and [name] movement. In standing with no UE support or single UE support, using [name] for most reaching tasks. Standing at BITS, visual scanning task for A-Z in standing for 1:45 with 37% accuracy. Numbers 1-40 in standing for 3:08 with 60% accuracy. One LOB but able to catch self on RW and correct. Switched to standing UB there ex with 3# weight for rows, circles but when attempted overhead pt relayed old L shoulder injury limiting and unable. Switched to standing at mirror using [name] to reach for squigz and place in container while on airex for additional standing balance, cues for keeping L knee from buckling. Back in room, requesting to sit EOB for lunch, set up alarm on call bell in reach all needs met.   Session 2: Pt up in wheelchair at time of session, agreeable to schedule change to accommodate. No pain reported. Pt transported to gym, and focused on [name] and sitting/standing balance with  the following: seated rebounder throws with light weight ball 1x15 and second round for throw + chest press 1x15, second round of each performed with 2kg weighted ball. Progressed to standing at rebounder for 1x15 throws with weighted ball for proprioceptive input and improving [name] awareness. Stand pivot > mat no AD with CGA, focused on [name] coordination and weight bearing by alternating single leg stance for tapping colors up to 3 at a time. Alternating taps on elevated cone approx 4 inches tall with single and BUE support. Ambulated back to room and set up alarm on call bell in reach.   Therapy Documentation Precautions:  Precautions Precautions: Fall Recall of Precautions/Restrictions: Intact Restrictions Weight Bearing Restrictions Per Provider Order: No      Therapy/Group: Individual Therapy  Chiquita JAYSON Hopping 05/19/2024, 7:30 AM

## 2024-05-20 ENCOUNTER — Inpatient Hospital Stay (HOSPITAL_COMMUNITY)

## 2024-05-20 DIAGNOSIS — I6782 Cerebral ischemia: Secondary | ICD-10-CM | POA: Diagnosis not present

## 2024-05-20 DIAGNOSIS — S0990XA Unspecified injury of head, initial encounter: Secondary | ICD-10-CM | POA: Diagnosis not present

## 2024-05-20 LAB — GLUCOSE, CAPILLARY
Glucose-Capillary: 109 mg/dL — ABNORMAL HIGH (ref 70–99)
Glucose-Capillary: 197 mg/dL — ABNORMAL HIGH (ref 70–99)
Glucose-Capillary: 185 mg/dL — ABNORMAL HIGH (ref 70–99)
Glucose-Capillary: 167 mg/dL — ABNORMAL HIGH (ref 70–99)

## 2024-05-20 NOTE — Progress Notes (Signed)
 This nurse went down with patient for stat head CT. Patient back in room with all needs met. Neuro assessment unchanged. Patient does endorse headache about 5/10 unchanged with PRN tylenol  given by previous shift. Dressing to head intact. Dressing to elbow and hand skin tears changed with vaseline gauze added. Provided update to MD Babs over phone. Did offer cold compress for patient but patient refused at this time. Will continue with vitals per protocol and tylenol  as needed.

## 2024-05-20 NOTE — Plan of Care (Signed)
  Problem: Consults Goal: RH STROKE PATIENT EDUCATION Description: See Patient Education module for education specifics  Outcome: Progressing   Problem: RH BOWEL ELIMINATION Goal: RH STG MANAGE BOWEL WITH ASSISTANCE Description: STG Manage Bowel with mod I Assistance. Outcome: Progressing Goal: RH STG MANAGE BOWEL W/MEDICATION W/ASSISTANCE Description: STG Manage Bowel with Medication with  mod I Assistance. Outcome: Progressing   Problem: RH SAFETY Goal: RH STG ADHERE TO SAFETY PRECAUTIONS W/ASSISTANCE/DEVICE Description: STG Adhere to Safety Precautions With cues  Assistance/Device. Outcome: Progressing   Problem: RH PAIN MANAGEMENT Goal: RH STG PAIN MANAGED AT OR BELOW PT'S PAIN GOAL Description: PAin < 4 with prns Outcome: Progressing   Problem: RH KNOWLEDGE DEFICIT Goal: RH STG INCREASE KNOWLEDGE OF DIABETES Description: Patient and S.O. will be able to manage DM using educational resources for medications and dietary modification independnetly Outcome: Progressing Goal: RH STG INCREASE KNOWLEDGE OF HYPERTENSION Description: Patient and S.O. will be able to manage HTN using educational resources for medications and dietary modification independnetly Outcome: Progressing Goal: RH STG INCREASE KNOWLEGDE OF HYPERLIPIDEMIA Description: Patient and S.O. will be able to manage HLD using educational resources for medications and dietary modification independnetly Outcome: Progressing Goal: RH STG INCREASE KNOWLEDGE OF STROKE PROPHYLAXIS Description: Patient and S.O. will be able to manage secondary risks using educational resources for medications and dietary modification independnetly Outcome: Progressing

## 2024-05-20 NOTE — Progress Notes (Signed)
 05/20/24 1835  What Happened  Was fall witnessed? No  Was patient injured? Yes  Patient found on floor (Inside the bathroom)  Found by Staff-comment  Stated prior activity other (comment) (Inside the bathroom)  Provider Notification  Provider Name/Title Babs Hussar, MD  Date Provider Notified 05/20/24  Time Provider Notified 1836  Method of Notification Call  Notification Reason Fall  Provider response Other (Comment) (Head CT)  Date of Provider Response 05/20/24  Time of Provider Response 1836  Follow Up  Family notified Yes - comment (Dawn, Partner)  Time family notified [phone]  Additional tests No  Simple treatment Dressing  Adult Fall Risk Assessment  Risk Factor Category (scoring not indicated) High fall risk per protocol (document High fall risk)  Age 73  Fall History: Fall within 6 months prior to admission 5  Elimination; Bowel and/or Urine Incontinence 0  Elimination; Bowel and/or Urine Urgency/Frequency 0  Medications: includes PCA/Opiates, Anti-convulsants, Anti-hypertensives, Diuretics, Hypnotics, Laxatives, Sedatives, and Psychotropics 3  Patient Care Equipment 0  Mobility-Assistance 2  Mobility-Gait 2  Mobility-Sensory Deficit 0  Altered awareness of immediate physical environment 0  Impulsiveness 0  Lack of understanding of one's physical/cognitive limitations 0  Total Score 14  Patient Fall Risk Level High fall risk  Adult Fall Risk Interventions  Required Bundle Interventions *See Row Information* High fall risk  Additional Interventions Use of appropriate toileting equipment (bedpan, BSC, etc.)  Fall intervention(s) refused/Patient educated regarding refusal Bed alarm;Nonskid socks  Pain Assessment  Pain Scale 0-10  Pain Score 4  Faces Pain Scale 4  Pain Type Acute pain  Pain Location Head  Pain Orientation Left;Upper (Eyebrow)  PCA/Epidural/Spinal Assessment  Respiratory Pattern Regular;Unlabored  Neurological  Neuro (WDL) X  Level of  Consciousness Alert  Orientation Level Oriented X4  Cognition Appropriate at baseline;Follows commands;Appropriate for developmental age  Speech Clear  R Pupil Size (mm) 2  R Pupil Shape Round  R Pupil Reaction Brisk  L Pupil Size (mm) 2  L Pupil Shape Round  L Pupil Reaction Brisk  Motor Function/Sensation Assessment Grip;Motor response  R Hand Grip Strong  L Hand Grip Strong  RUE Motor Response Purposeful movement  RUE Sensation Full sensation  RUE Motor Strength 5  [name] Motor Response Purposeful movement  [name] Sensation Full sensation  [name] Motor Strength 4  RLE Motor Response Purposeful movement  RLE Sensation Full sensation  RLE Motor Strength 5  [name] Motor Response Purposeful movement  [name] Sensation Full sensation  [name] Motor Strength 4  Neuro Symptoms None  Glasgow Coma Scale  Eye Opening 4  Best Verbal Response (NON-intubated) 5  Best Motor Response 6  Glasgow Coma Scale Score 15  NIH Stroke Scale   Dizziness Present No  Headache Present No  Level of Consciousness (1a.)    0  LOC Questions (1b. )    0  LOC Commands (1c. )    0  Best Gaze (2. )   0  Visual (3. )   0  Facial Palsy (4. )     0  Motor Arm, Left (5a. )    0  Motor Arm, Right (5b. )  0  Motor Leg, Left (6a. )   0  Motor Leg, Right (6b. )  0  Limb Ataxia (7. ) 0  Sensory (8. )   0  Best Language (9. )   0  Dysarthria (10. ) 0  Extinction/Inattention (11.)    0  Complete NIHSS TOTAL 0  Musculoskeletal  Musculoskeletal (WDL) X  Assistive Device Front wheel walker  Generalized Weakness Yes  Weight Bearing Restrictions Per Provider Order No  Musculoskeletal Details  [name] Weakness  [name] Weakness  Integumentary  Integumentary (WDL) X  Nurse assisting at admit/transfer - Full skin assessment  Maribeth, RN)  Skin Color Appropriate for ethnicity  Skin Condition Dry  Skin Integrity Erythema/redness (Skin tear on [place],L  elbow and Left eyebrow)  Erythema/Redness Location Arm;Head  Erythema/Redness  Location Orientation Left  Skin Turgor Non-tenting  Pain Assessment  Date Pain First Started 05/20/24  Result of Injury Yes  Pain Assessment  Work-Related Injury No  Pain Screening  Clinical Progression Not changed

## 2024-05-21 ENCOUNTER — Other Ambulatory Visit (HOSPITAL_COMMUNITY): Payer: Self-pay

## 2024-05-21 DIAGNOSIS — C3490 Malignant neoplasm of unspecified part of unspecified bronchus or lung: Secondary | ICD-10-CM | POA: Diagnosis not present

## 2024-05-21 DIAGNOSIS — F418 Other specified anxiety disorders: Secondary | ICD-10-CM | POA: Diagnosis not present

## 2024-05-21 DIAGNOSIS — I639 Cerebral infarction, unspecified: Secondary | ICD-10-CM | POA: Diagnosis not present

## 2024-05-21 DIAGNOSIS — I63411 Cerebral infarction due to embolism of right middle cerebral artery: Secondary | ICD-10-CM | POA: Diagnosis not present

## 2024-05-21 DIAGNOSIS — J449 Chronic obstructive pulmonary disease, unspecified: Secondary | ICD-10-CM | POA: Diagnosis not present

## 2024-05-21 DIAGNOSIS — D72829 Elevated white blood cell count, unspecified: Secondary | ICD-10-CM | POA: Diagnosis not present

## 2024-05-21 LAB — GLUCOSE, CAPILLARY
Glucose-Capillary: 96 mg/dL (ref 70–99)
Glucose-Capillary: 189 mg/dL — ABNORMAL HIGH (ref 70–99)
Glucose-Capillary: 207 mg/dL — ABNORMAL HIGH (ref 70–99)
Glucose-Capillary: 159 mg/dL — ABNORMAL HIGH (ref 70–99)

## 2024-05-21 LAB — CBC
HCT: 39 % (ref 36.0–46.0)
Hemoglobin: 13 g/dL (ref 12.0–15.0)
MCH: 31.9 pg (ref 26.0–34.0)
MCHC: 33.3 g/dL (ref 30.0–36.0)
MCV: 95.8 fL (ref 80.0–100.0)
Platelets: 330 K/uL (ref 150–400)
RBC: 4.07 MIL/uL (ref 3.87–5.11)
RDW: 12.6 % (ref 11.5–15.5)
WBC: 15.2 K/uL — ABNORMAL HIGH (ref 4.0–10.5)
nRBC: 0 % (ref 0.0–0.2)

## 2024-05-21 LAB — BASIC METABOLIC PANEL WITH GFR
Anion gap: 10 (ref 5–15)
BUN: 25 mg/dL — ABNORMAL HIGH (ref 8–23)
CO2: 24 mmol/L (ref 22–32)
Calcium: 8.4 mg/dL — ABNORMAL LOW (ref 8.9–10.3)
Chloride: 104 mmol/L (ref 98–111)
Creatinine, Ser: 0.99 mg/dL (ref 0.44–1.00)
GFR, Estimated: >60 mL/min (ref >60)
Glucose, Bld: 142 mg/dL — ABNORMAL HIGH (ref 70–99)
Potassium: 3.7 mmol/L (ref 3.5–5.1)
Sodium: 138 mmol/L (ref 135–145)

## 2024-05-21 MED ORDER — FAMOTIDINE 20 MG PO TABS
20.0000 mg | ORAL_TABLET | Freq: Two times a day (BID) | ORAL | 0 refills | Status: AC
  Filled 2024-05-21: qty 60, 30d supply, fill #0

## 2024-05-21 MED ORDER — IPRATROPIUM-ALBUTEROL 0.5-2.5 (3) MG/3ML IN SOLN: 3.0000 mL | Freq: Two times a day (BID) | RESPIRATORY_TRACT | Status: DC

## 2024-05-21 MED ORDER — INSULIN GLARGINE-YFGN 100 UNIT/ML ~~LOC~~ SOLN
10.0000 [IU] | Freq: Every day | SUBCUTANEOUS | Status: DC
  Administered 2024-05-22: 10 [IU] via SUBCUTANEOUS
  Filled 2024-05-21 (×2): qty 0.1

## 2024-05-21 NOTE — Discharge Instructions (Addendum)
 Inpatient Rehab Discharge Instructions  LOGAN VEGH  Discharge date and time: 06/01/24   Activities/Precautions/ Functional Status:  Activity: no lifting, driving, or strenuous exercise for until cleared by MD  Diet: diabetic diet  Wound Care: none needed   Functional status:  ___ No restrictions     ___ Walk up steps independently ___ 24/7 supervision/assistance   ___ Walk up steps with assistance __X_ Intermittent supervision/assistance  ___ Bathe/dress independently ___ Walk with walker     ___ Bathe/dress with assistance ___ Walk Independently    ___ Shower independently ___ Walk with assistance    ___ Shower with assistance __X_ No alcohol     ___ Return to work/school ________  Special Instructions:     COMMUNITY REFERRALS UPON DISCHARGE:    Home Health:   PT   &    OT                  Agency:[facility]    Phone:[phone]    Medical Equipment/Items Ordered:ROLLING WALKER                                                 Agency/Supplier: [facility]   [phone]  [facility]-[phone] CALL TO SET UP TRANSPORT FOR APPOINTMENTS    My questions have been answered and I understand these instructions. I will adhere to these goals and the provided educational materials after my discharge from the hospital.  Patient/Caregiver Signature _______________________________ Date __________  Clinician Signature _______________________________________ Date __________  Please bring this form and your medication list with you to all your follow-up doctor's appointments.    STROKE/TIA DISCHARGE INSTRUCTIONS SMOKING Cigarette smoking nearly doubles your risk of having a stroke & is the single most alterable risk factor  If you smoke or have smoked in the last 12 months, you are advised to quit smoking for your health. Most of the excess cardiovascular risk related to smoking disappears within a year of stopping. Ask you doctor about  anti-smoking medications [place] Quit Line: 1-800-QUIT NOW Free Smoking Cessation Classes [phone]  CHOLESTEROL Know your levels; limit fat & cholesterol in your diet  Lipid Panel     Component Value Date/Time   CHOL 101 05/12/2024 0556   TRIG 166 (H) 05/12/2024 0556   HDL 45 05/12/2024 0556   CHOLHDL 2.3 05/12/2024 0556   VLDL 33 05/12/2024 0556   LDLCALC 23 05/12/2024 0556     Many patients benefit from treatment even if their cholesterol is at goal. Goal: Total Cholesterol (CHOL) less than 160 Goal:  Triglycerides (TRIG) less than 150 Goal:  HDL greater than 40 Goal:  LDL (LDLCALC) less than 100   BLOOD PRESSURE American Stroke Association blood pressure target is less that 120/80 mm/Hg  Your discharge blood pressure is:  BP: 130/73 Monitor your blood pressure Limit your salt and alcohol intake Many individuals will require more than one medication for high blood pressure  DIABETES (A1c is a blood sugar average for last 3 months) Goal HGBA1c is under 7% (HBGA1c is blood sugar average for last 3 months)  Diabetes: Diagnosis of diabetes:  Your A1c:7.4 %    Lab Results  Component Value Date   HGBA1C 7.4 (H) 05/11/2024    Your HGBA1c can be lowered with medications, healthy diet, and exercise. Check your blood sugar as directed by  your physician Call your physician if you experience unexplained or low blood sugars.  PHYSICAL ACTIVITY/REHABILITATION Goal is 30 minutes at least 4 days per week  Activity: Increase activity slowly,, No driving,, and Walk with assistance, Therapies: Physical Therapy: Home Health and Occupational Therapy: Home Health Return to work: not until cleared  Activity decreases your risk of heart attack and stroke and makes your heart stronger.  It helps control your weight and blood pressure; helps you relax and can improve your mood. Participate in a regular exercise program. Talk with your doctor about the best form of exercise for you (dancing, walking,  swimming, cycling).  DIET/WEIGHT Goal is to maintain a healthy weight  Your discharge diet is:  Diet Order             Diet heart healthy/carb modified Room service appropriate? Yes; Fluid consistency: Thin  Diet effective now                  Your height is:  Height: 5' 4 (162.6 cm) Your current weight is: Weight: 79.8 kg Your Body Mass Index (BMI) is:  BMI (Calculated): 30.2 Following the type of diet specifically designed for you will help prevent another stroke. Your goal Body Mass Index (BMI) is 19-24. Healthy food habits can help reduce 3 risk factors for stroke:  High cholesterol, hypertension, and excess weight.  RESOURCES Stroke/Support Group:  Call [phone]   STROKE EDUCATION PROVIDED/REVIEWED AND GIVEN TO PATIENT Stroke warning signs and symptoms How to activate emergency medical system (call 911). Medications prescribed at discharge. Need for follow-up after discharge. Personal risk factors for stroke. Pneumonia vaccine given: No Flu vaccine given: No My questions have been answered, the writing is legible, and I understand these instructions.  I will adhere to these goals & educational materials that have been provided to me after my discharge from the hospital.

## 2024-05-21 NOTE — Discharge Summary (Signed)
 Physician Discharge Summary  Patient ID: TERISA BELARDO MRN: [id number] DOB/AGE: November 09, 1950 73 y.o.  Admit date: 05/17/2024 Discharge date: 05/31/2024  Discharge Diagnoses:  Principal Problem:   CVA (cerebral vascular accident) [facility]) Active Problems:   Essential hypertension   Tobacco abuse   Pulmonary nodule 1 cm or greater in diameter   Gastroesophageal reflux disease   Drug-induced myopathy   Benign essential hypertension   Major depressive disorder, recurrent, moderate (HCC)   Malignant neoplasm of lung (HCC)   Thyroid  nodule   Panic disorder   Migraine without aura and responsive to treatment   Stage 2 chronic kidney disease   Poorly controlled type 2 diabetes mellitus (HCC)   Acute hypoxemic respiratory failure (HCC)   Acute left-sided weakness   AKI (acute kidney injury)   Hypokalemia   Acute CVA (cerebrovascular accident) (HCC)   Depression with anxiety   Discharged Condition: stable  Significant Diagnostic Studies: CT HEAD WO CONTRAST ( ) Result Date: 05/20/2024 EXAM: CT HEAD WITHOUT CONTRAST 05/20/2024 08:32:32 PM TECHNIQUE: CT of the head was performed without the administration of intravenous contrast. Automated exposure control, iterative reconstruction, and/or weight based adjustment of the mA/kV was utilized to reduce the radiation dose to as low as reasonably achievable. COMPARISON: Prior MRI from 05/12/2024. CLINICAL HISTORY: Head trauma, minor (Age >= 65y). FINDINGS: BRAIN AND VENTRICLES: No acute hemorrhage. No new large vessel territory infarct. Previously identified right cerebral infarcts are grossly similar as compared to previous MRI. No evidence for hemorrhagic transformation or significant mass effect. Changes of chronic microvascular ischemic disease again noted. Mild atheromatous change about the carotid siphons. No hydrocephalus. No extra-axial collection. No mass effect or midline shift. ORBITS: No acute abnormality. SINUSES: No acute abnormality.  SOFT TISSUES AND SKULL: Vertebral volume within normal limits. No acute soft tissue abnormality. No skull fracture. IMPRESSION: 1. No new acute intracranial abnormality. 2. Grossly similar appearance of previously identified right cerebral infarcts. No significant mass effect or evidence for hemorrhagic transformation. 3. Underlying chronic microvascular ischemic disease. Electronically signed by: Morene Hoard MD 05/20/2024 08:53 PM EDT RP Workstation: [id number]   EP PPM/ICD IMPLANT Result Date: 05/17/2024 SURGEON:  Soyla Gladis Norton, MD   PREPROCEDURE DIAGNOSIS:  Cryptogenic stroke   POSTPROCEDURE DIAGNOSIS: Cryptogenic stroke    PROCEDURES:  1. Implantable loop recorder implantation   INTRODUCTION:  [place] D Biswas presents with a history of cryptogenic stroke The costs of loop recorder monitoring have been discussed with the patient. Appropriate time out was performed prior to the procedure.   DESCRIPTION OF PROCEDURE:  Informed written consent was obtained.  The patient required no sedation for the procedure today.  Mapping over the patient's chest was performed to identify the area where electrograms were most prominent for ILR recording.  This area was found to be the left parasternal region over the 4th intercostal space. The patients left chest was therefore prepped and draped in the usual sterile fashion. The skin overlying the left parasternal region was infiltrated with lidocaine  for local analgesia.  A 0.5-cm incision was made over the left parasternal region over the 3rd intercostal space.  A subcutaneous ILR pocket was fashioned using a combination of sharp and blunt dissection.  A Med[employer] Reveal LINQ 2 (serial # [id number]) implantable loop recorder was then placed into the pocket  R waves were very prominent and measured 0.42mV.  Steri- Strips and a sterile dressing were then applied.  There were no early apparent complications.   CONCLUSIONS:  1. Successful implantation of a  implantable loop recorder for a history of cryptogenic stroke  2. No early apparent complications. Will Gladis Norton, MD 05/17/2024 3:07 PM   ECHOCARDIOGRAM COMPLETE Result Date: 05/13/2024    ECHOCARDIOGRAM REPORT   Patient Name:   PLACIDA CAMBRE Date of Exam: 05/13/2024 Medical Rec #:  [id number]       Height:       64.0 in Accession #:    [id number]      Weight:       177.0 lb Date of Birth:  1951-01-10        BSA:          1.857 m Patient Age:    73 years        BP:           140/81 mmHg Patient Gender: F               HR:           77 bpm. Exam Location:  Inpatient Procedure: 2D Echo, Cardiac Doppler and Color Doppler (Both Spectral and Color            Flow Doppler were utilized during procedure). Indications:    Stroke I63.9  History:        Patient has prior history of Echocardiogram examinations, most                 recent 11/26/2020. COPD; Risk Factors:Hypertension and Diabetes.  Sonographer:    Jayson Gaskins Referring Phys: [id number] VASUNDHRA RATHORE IMPRESSIONS  1. Left ventricular ejection fraction, by estimation, is 60 to 65%. The left ventricle has normal function. The left ventricle has no regional wall motion abnormalities. There is mild concentric left ventricular hypertrophy. Left ventricular diastolic parameters are indeterminate.  2. Right ventricular systolic function is normal. The right ventricular size is normal. Tricuspid regurgitation signal is inadequate for assessing PA pressure.  3. The mitral valve is degenerative. Trivial mitral valve regurgitation.  4. The aortic valve is tricuspid. Aortic valve regurgitation is not visualized. Aortic valve sclerosis/calcification is present, without any evidence of aortic stenosis. Aortic valve mean gradient measures 3.0 mmHg.  5. The inferior vena cava is dilated in size with <50% respiratory variability, suggesting right atrial pressure of 15 mmHg. Comparison(s): Prior images reviewed side by side. LVEF 60-65%. No obvious interatrial shunting.  FINDINGS  Left Ventricle: Left ventricular ejection fraction, by estimation, is 60 to 65%. The left ventricle has normal function. The left ventricle has no regional wall motion abnormalities. The left ventricular internal cavity size was normal in size. There is  mild concentric left ventricular hypertrophy. Left ventricular diastolic parameters are indeterminate. Right Ventricle: The right ventricular size is normal. No increase in right ventricular wall thickness. Right ventricular systolic function is normal. Tricuspid regurgitation signal is inadequate for assessing PA pressure. Left Atrium: Left atrial size was normal in size. Right Atrium: Right atrial size was normal in size. Pericardium: There is no evidence of pericardial effusion. Presence of epicardial fat layer. Mitral Valve: The mitral valve is degenerative in appearance. Mild to moderate mitral annular calcification. Trivial mitral valve regurgitation. MV peak gradient, 8.0 mmHg. The mean mitral valve gradient is 4.0 mmHg. Tricuspid Valve: The tricuspid valve is grossly normal. Tricuspid valve regurgitation is trivial. Aortic Valve: The aortic valve is tricuspid. There is mild aortic valve annular calcification. Aortic valve regurgitation is not visualized. Aortic valve sclerosis/calcification is present, without any evidence of aortic stenosis. Aortic valve mean gradient measures 3.0 mmHg. Aortic valve peak  gradient measures 5.0 mmHg. Aortic valve area, by VTI measures 2.05 cm. Pulmonic Valve: The pulmonic valve was not well visualized. Pulmonic valve regurgitation is trivial. Aorta: The aortic root and ascending aorta are structurally normal, with no evidence of dilitation. Venous: The inferior vena cava is dilated in size with less than 50% respiratory variability, suggesting right atrial pressure of 15 mmHg. IAS/Shunts: No atrial level shunt detected by color flow Doppler. Additional Comments: 3D was performed not requiring image post processing  on an independent workstation and was indeterminate.  LEFT VENTRICLE PLAX 2D LVIDd:         3.90 cm   Diastology LVIDs:         2.60 cm   LV e' medial:    7.40 cm/s LV PW:         1.10 cm   LV E/e' medial:  16.1 LV IVS:        1.20 cm   LV e' lateral:   8.92 cm/s LVOT diam:     1.80 cm   LV E/e' lateral: 13.3 LV SV:         46 LV SV Index:   25 LVOT Area:     2.54 cm  RIGHT VENTRICLE RV S prime:     20.10 cm/s TAPSE (M-mode): 2.2 cm LEFT ATRIUM           Index        RIGHT ATRIUM          Index LA Vol (A2C): 34.4 ml 18.52 ml/m  RA Area:     9.68 cm LA Vol (A4C): 51.5 ml 27.73 ml/m  RA Volume:   17.50 ml 9.42 ml/m  AORTIC VALVE AV Area (Vmax):    2.10 cm AV Area (Vmean):   2.02 cm AV Area (VTI):     2.05 cm AV Vmax:           112.00 cm/s AV Vmean:          82.300 cm/s AV VTI:            0.225 m AV Peak Grad:      5.0 mmHg AV Mean Grad:      3.0 mmHg LVOT Vmax:         92.60 cm/s LVOT Vmean:        65.200 cm/s LVOT VTI:          0.181 m LVOT/AV VTI ratio: 0.80  AORTA Ao Root diam: 3.10 cm MITRAL VALVE MV Area (PHT): 3.50 cm     SHUNTS MV Area VTI:   1.19 cm     Systemic VTI:  0.18 m MV Peak grad:  8.0 mmHg     Systemic Diam: 1.80 cm MV Mean grad:  4.0 mmHg MV Vmax:       1.42 m/s MV Vmean:      92.9 cm/s MV Decel Time: 217 msec MV E velocity: 119.00 cm/s MV A velocity: 123.00 cm/s MV E/A ratio:  0.97 Jayson Sierras MD Electronically signed by Jayson Sierras MD Signature Date/Time: 05/13/2024/12:39:45 PM    Final    VAS US  LOWER EXTREMITY VENOUS (DVT) Result Date: 05/12/2024  Lower Venous DVT Study Patient Name:  JACKELIN CORREIA  Date of Exam:   05/12/2024 Medical Rec #: [id number]        Accession #:    [id number] Date of Birth: [birth year]         Patient Gender: F Patient Age:   42 years Exam Location:  [facility] Procedure:      VAS US  LOWER EXTREMITY VENOUS (DVT) Referring Phys: ARY XU --------------------------------------------------------------------------------  Indications: Stroke in  cancer patient.  Risk Factors: Newly diagnosed squamous cell cancer of the lung. Comparison Study: Prior negative left LEV done 04/15/20 at [facility] Performing Technologist: Alberta Lis RVS  Examination Guidelines: A complete evaluation includes B-mode imaging, spectral Doppler, color Doppler, and power Doppler as needed of all accessible portions of each vessel. Bilateral testing is considered an integral part of a complete examination. Limited examinations for reoccurring indications may be performed as noted. The reflux portion of the exam is performed with the patient in reverse Trendelenburg.  +---------+---------------+---------+-----------+----------+--------------+ RIGHT    CompressibilityPhasicitySpontaneityPropertiesThrombus Aging +---------+---------------+---------+-----------+----------+--------------+ CFV      Full           Yes      Yes                                 +---------+---------------+---------+-----------+----------+--------------+ SFJ      Full                                                        +---------+---------------+---------+-----------+----------+--------------+ FV Prox  Full           Yes      Yes                                 +---------+---------------+---------+-----------+----------+--------------+ FV Mid   Full           Yes      Yes                                 +---------+---------------+---------+-----------+----------+--------------+ FV DistalFull           Yes      Yes                                 +---------+---------------+---------+-----------+----------+--------------+ PFV      Full           Yes      Yes                                 +---------+---------------+---------+-----------+----------+--------------+ POP      Full                                                        +---------+---------------+---------+-----------+----------+--------------+ PTV      Full                                                         +---------+---------------+---------+-----------+----------+--------------+ PERO     Full                                                        +---------+---------------+---------+-----------+----------+--------------+   +---------+---------------+---------+-----------+----------+--------------+  LEFT     CompressibilityPhasicitySpontaneityPropertiesThrombus Aging +---------+---------------+---------+-----------+----------+--------------+ CFV      Full           Yes      Yes                                 +---------+---------------+---------+-----------+----------+--------------+ SFJ      Full                                                        +---------+---------------+---------+-----------+----------+--------------+ FV Prox  Full           Yes      Yes                                 +---------+---------------+---------+-----------+----------+--------------+ FV Mid   Full                                                        +---------+---------------+---------+-----------+----------+--------------+ FV DistalFull                                                        +---------+---------------+---------+-----------+----------+--------------+ PFV      Full           Yes      Yes                                 +---------+---------------+---------+-----------+----------+--------------+ POP      Full           Yes      Yes                                 +---------+---------------+---------+-----------+----------+--------------+ PTV      Full                                                        +---------+---------------+---------+-----------+----------+--------------+ PERO     Full                                                        +---------+---------------+---------+-----------+----------+--------------+     Summary: BILATERAL: - No evidence of deep vein thrombosis seen in the lower extremities,  bilaterally. -No evidence of popliteal cyst, bilaterally.   *See table(s) above for measurements and observations. Electronically signed by Gaile New MD on 05/12/2024 at 10:12:34 PM.    Final    MR BRAIN WO CONTRAST Result Date: 05/12/2024  EXAM: MR Brain without Intravenous Contrast. CLINICAL HISTORY: 73 year old female with acute neuro deficit, stroke suspected, presenting yesterday. TECHNIQUE: Magnetic resonance images of the brain without intravenous contrast in multiple planes. CONTRAST: Without. COMPARISON: Head CT and CTA head and neck 05/11/2024, brain MRI 05/08/2024. FINDINGS: BRAIN: Widely scattered small and patchy areas of diffusion restriction in the right hemisphere correspond to the right MCA territory, and the right MCA/PCA watershed area. Confluent superior perirolandic involvement although postcentral involvement predominates (series 5 image 44). Minimal involvement of the right caudate, basal ganglia otherwise spared. No contralateral left hemisphere or posterior fossa diffusion restriction. T2 and FLAIR hyperintense cytotoxic edema in the affected areas and there is scattered petechial hemorrhage in the right parietal and occipital lobe areas of involvement, new since 05/08/2024. No malignant hemorrhagic transformation. No intracranial mass effect. No midline shift or extra-axial fluid collection. Preexisting widespread chronic white matter T2 and FLAIR hyperintensity again noted. No cerebellar tonsillar ectopia. The central arterial and venous flow voids are patent. VENTRICLES: No hydrocephalus. ORBITS: The orbits are normal. SINUSES AND MASTOIDS: The sinuses and mastoid air cells are clear. BONES: No acute fracture or focal osseous lesion. IMPRESSION: 1. Widely scattered small Acute infarcts in the right MCA and right MCA/PCA watershed territories. 2. Cytotoxic edema and scattered acute petechial hemorrhage (right parietal and occipital). No malignant hemorrhagic transformation. No  intracranial mass effect. 3. Underlying chronic white matter disease. Electronically signed by: Helayne Hurst MD 05/12/2024 10:05 AM EDT RP Workstation: [id number]   CT ANGIO HEAD NECK W WO CM W PERF (CODE STROKE) Result Date: 05/11/2024 EXAM: CTA Head and Neck with Perfusion 05/11/2024 06:08:14 PM TECHNIQUE: CTA of the head and neck was performed without and with the administration of 100 mL of intravenous contrast (iohexol  (OMNIPAQUE ) 350 MG/ML injection 100 mL IOHEXOL  350 MG/ML SOLN). 3D postprocessing with multiplanar reconstructions and MIPs was performed to evaluate the vascular anatomy. Cerebral perfusion analysis using computed tomography with contrast administration, including post-processing of parametric maps with determination of cerebral blood flow, cerebral blood volume, mean transit time and time-to-maximum. Automated exposure control, iterative reconstruction, and/or weight based adjustment of the mA/kV was utilized to reduce the radiation dose to as low as reasonably achievable. COMPARISON: None available CLINICAL HISTORY: Neuro deficit, acute, stroke suspected. Left sided weakness. FINDINGS: CTA NECK: AORTIC ARCH AND ARCH VESSELS: Common origin of left common carotid artery and a dominant artery is noted. Atherosclerotic changes are present at the aorta and at the origin of the left subclavian artery without aneurysm or focal stenosis. No dissection or arterial injury. No significant stenosis of the brachiocephalic or subclavian arteries. CERVICAL CAROTID ARTERIES: Atherosclerotic changes are present at the right carotid bifurcation and proximal right ICA without focal stenosis. Atherosclerotic changes are present at the left carotid bifurcation and proximal left ICA without focal stenosis. No dissection, arterial injury, or hemodynamically significant stenosis by NASCET criteria. CERVICAL VERTEBRAL ARTERIES: The left vertebral artery is a dominant vessel. Moderate tortuosity is present in the V1  segment of the left vertebral artery without focal stenosis. No dissection, arterial injury, or significant stenosis. LUNGS AND MEDIASTINUM: Unremarkable. SOFT TISSUES: No acute abnormality. BONES: Grade 1 anterolisthesis and moderate right facet degenerative change leads to moderate right foraminal stenosis at C3-C4. CTA HEAD: ANTERIOR CIRCULATION: No significant stenosis of the internal carotid arteries. No significant stenosis of the anterior cerebral arteries. No significant stenosis of the middle cerebral arteries. No aneurysm. POSTERIOR CIRCULATION: No significant stenosis of the posterior cerebral arteries. No significant stenosis  of the basilar artery. No significant stenosis of the vertebral arteries. No aneurysm. OTHER: No dural venous sinus thrombosis on this non-dedicated study. CT PERFUSION: EXAM QUALITY: Exam quality is adequate with diagnostic perfusion maps. No significant motion artifact. Appropriate arterial inflow and venous outflow curves. CORE INFARCT (CBF<30% volume): 0 mL TOTAL HYPOPERFUSION (Tmax>6s volume): 0 mL PENUMBRA: Mismatch volume: 0 mL IMPRESSION: 1. No acute large vessel occlusion. 2. No hemodynamically significant stenosis or aneurysm in the head or neck vessels. 3. No evidence of ischemia by CT brain perfusion. Electronically signed by: Lonni Necessary MD 05/11/2024 06:23 PM EDT RP Workstation: [id number]   CT HEAD CODE STROKE WO CONTRAST Result Date: 05/11/2024 EXAM: CT HEAD WITHOUT CONTRAST 05/11/2024 06:08:14 PM TECHNIQUE: CT of the head was performed without the administration of intravenous contrast. Automated exposure control, iterative reconstruction, and/or weight based adjustment of the mA/kV was utilized to reduce the radiation dose to as low as reasonably achievable. COMPARISON: None available. CLINICAL HISTORY: Neuro deficit, acute, stroke suspected. Left sided weakness. FINDINGS: BRAIN AND VENTRICLES: Intravascular contrast from CT angio chest of the same day  reduces sensitivity for subarachnoid blood. No focal hemorrhage is present. Periventricular and scattered subcortical white matter hypoattenuation is moderately advanced for age. This most likely reflects the sequelae of chronic microvascular ischemia. No evidence of acute infarct. No hydrocephalus. No extra-axial collection. No mass effect or midline shift. ORBITS: No acute abnormality. SINUSES: No acute abnormality. SOFT TISSUES AND SKULL: No acute soft tissue abnormality. No skull fracture. IMPRESSION: 1. No acute intracranial abnormality. Electronically signed by: Lonni Necessary MD 05/11/2024 06:13 PM EDT RP Workstation: [id number]   CT Angio Chest PE W and/or Wo Contrast Result Date: 05/11/2024 CLINICAL DATA:  Pulmonary embolism (PE) suspected, low to intermediate prob, positive D-dimer. Difficulty breathing. Possible allergic reaction. EXAM: CT ANGIOGRAPHY CHEST WITH CONTRAST TECHNIQUE: Multidetector CT imaging of the chest was performed using the standard protocol during bolus administration of intravenous contrast. Multiplanar CT image reconstructions and MIPs were obtained to evaluate the vascular anatomy. RADIATION DOSE REDUCTION: This exam was performed according to the departmental dose-optimization program which includes automated exposure control, adjustment of the mA and/or kV according to patient size and/or use of iterative reconstruction technique. CONTRAST:  75mL OMNIPAQUE  IOHEXOL  350 MG/ML SOLN COMPARISON:  None Available. FINDINGS: Cardiovascular: Scattered coronary artery and aortic atherosclerosis. Heart is normal size. Aorta is normal caliber. No filling defects in the pulmonary arteries to suggest pulmonary emboli. Mediastinum/Nodes: No mediastinal, hilar, or axillary adenopathy. Trachea and esophagus are unremarkable. 1.6 cm left thyroid  nodule, unchanged since prior study. This has been evaluated on previous imaging. (ref: J Am Coll Radiol. 2015 Feb;12(2): 143-50). Lungs/Pleura:  Lingular nodule again noted measuring 1.4 cm in greatest diameter, unchanged. Biopsy fiducial adjacent to the nodule, unchanged since prior PET CT. No new nodules. Bilateral lower lobe airway thickening with areas of mucous plugging and bibasilar atelectasis. No effusions. Upper Abdomen: No acute findings Musculoskeletal: Chest wall soft tissues are unremarkable. No acute bony abnormality. Review of the MIP images confirms the above findings. IMPRESSION: No evidence of pulmonary embolus. Stable lingular nodule, 1.4 cm. Airway thickening with bibasilar atelectasis. Coronary artery disease. Aortic Atherosclerosis (ICD10-I70.0). Electronically Signed   By: Franky Crease M.D.   On: 05/11/2024 17:31   DG Chest Port 1 View Result Date: 05/11/2024 EXAM: 1 VIEW(S) XRAY OF THE CHEST 05/11/2024 01:50:00 PM COMPARISON: 04/23/2024 CLINICAL HISTORY: Allergic reaction [id number]. Per chart: Pt BIB EMS from Home due to sudden difficulty breathing after take her  medication BC for migraines possible allergic reaction; initially SpO2 65% on room air. Hx of Lung Cancer and COPD, no O2 at home. Pt has an appointment with Cancer center ; at 2:30. FINDINGS: LUNGS AND PLEURA: Vague nodule with fiducial marker in left mid lung. No pulmonary edema. No pleural effusion. No pneumothorax. HEART AND MEDIASTINUM: No acute abnormality of the cardiac and mediastinal silhouettes. BONES AND SOFT TISSUES: No acute osseous abnormality. IMPRESSION: 1. No acute cardiopulmonary process. 2. Left mid-lung pulmonary   fiducial marker. Electronically signed by: Dayne Hassell MD 05/11/2024 02:10 PM EDT RP Workstation: [id number]   NM PET Image Restage (PS) Skull Base to Thigh (F-18 FDG) Result Date: 05/10/2024 CLINICAL DATA:  Subsequent treatment strategy for restaging of non-small-cell lung cancer. EXAM: NUCLEAR MEDICINE PET SKULL BASE TO THIGH TECHNIQUE: 8.9 mCi F-18 FDG was injected intravenously. Full-ring PET imaging was performed from the skull base  to thigh after the radiotracer. CT data was obtained and used for attenuation correction and anatomic localization. Fasting blood glucose: 139 mg/dl COMPARISON:  91/79/7974 lung cancer screening CT. PET of 12/20/2023 also reviewed. FINDINGS: Mediastinal blood pool activity: SUV max 3.1 Liver activity: SUV max NA NECK: No areas of abnormal hypermetabolism. Incidental CT findings: Bilateral carotid atherosclerosis. A left-sided 1.6 cm thyroid  nodule has been evaluated on 06/03/2022 ultrasound. No cervical adenopathy. CHEST: No thoracic nodal hypermetabolism. The lingular nodule is hypermetabolic. 1.6 x 1.3 cm and a S.U.V. max of 4.6 cm 36/7. Adjacent biopsy fiducial. Incidental CT findings: Centrilobular emphysema. Aortic and coronary artery calcification. ABDOMEN/PELVIS: No abdominopelvic parenchymal or nodal hypermetabolism. Incidental CT findings: Normal adrenal glands. Hepatic morphology again suspicious for mild cirrhosis. Cholecystectomy. Fat containing ventral abdominal wall laxity. Hysterectomy. SKELETON: No abnormal marrow activity. Incidental CT findings: Lumbosacral spine fixation. IMPRESSION: 1. Hypermetabolic 1.6 cm lingular nodule, without thoracic nodal or distant metastasis. T1b, N0, M0. Stage 1A. 2. Incidental findings, including: Aortic atherosclerosis (ICD10-I70.0), coronary artery atherosclerosis and emphysema (ICD10-J43.9). Probable mild cirrhosis. Electronically Signed   By: Rockey Kilts M.D.   On: 05/10/2024 15:08   MR BRAIN W WO CONTRAST Result Date: 05/09/2024 CLINICAL DATA:  Lung cancer EXAM: MRI HEAD WITHOUT AND WITH CONTRAST TECHNIQUE: Multiplanar, multiecho pulse sequences of the brain and surrounding structures were obtained without and with intravenous contrast. CONTRAST:  8mL GADAVIST GADOBUTROL 1 MMOL/ML IV SOLN COMPARISON:  None Available. FINDINGS: MRI brain: There are multiple foci of T2 hyperintensity in the cerebral white matter. These do not have restricted diffusion. The  signal in the brain parenchyma is normal. No abnormal enhancement. There is no acute or chronic infarct. The ventricles are normal. No mass lesion. There are normal flow signals in the carotid arteries and basilar artery. No significant bone marrow signal abnormality. No significant abnormality in the paranasal sinuses or soft tissues. IMPRESSION: No evidence of metastatic disease. There are multiple T2 hyperintensities within the cerebral white matter. These abnormalities are nonspecific and of uncertain etiology or clinical significance. They are more common in older patients and patients with risk factors for vascular disease. Electronically Signed   By: Nancyann Burns M.D.   On: 05/09/2024 09:22    Labs:  Basic Metabolic Panel: Recent Labs  Lab 05/28/24 0520 05/31/24 0451  NA 140 140  K 3.7 3.6  CL 105 104  CO2 24 24  GLUCOSE 132* 135*  BUN 13 11  CREATININE 0.79 0.69  CALCIUM  8.5* 8.7*    CBC: Recent Labs  Lab 05/28/24 0520 05/31/24 0451  WBC 8.7 8.3  HGB 11.9* 11.2*  HCT 36.4 33.2*  MCV 97.1 95.7  PLT 212 174    CBG: Recent Labs  Lab 05/30/24 0612 05/30/24 1202 05/30/24 1626 05/30/24 2157 05/31/24 0639  GLUCAP 138* 111* 122* 129* 152*    Brief HPI:   Yanisa D Ferrer is a 74 y.o. female with past medical history of tobacco abuse, squamous cell carcinoma of the lung (newly diagnosed 04/2024), COPD, coronary artery calcification, depression, type 2 diabetes, hypertension, migraines who presented to [facility] emergency department on 05/11/2024, with a sudden onset of shortness of breath. The patient had previously used BC powder for a headache and believed she was experiencing an allergic reaction. EMS administered Solu-Medrol , two rounds of epinephrine , and Zofran . In the emergency department, she was tachypneic but maintained oxygen  saturation at 96% on six liters of oxygen . A code stroke was activated due to left neglect and hemiparesis. She was not eligible for TNK  treatment as she was outside the treatment window. ED labs showed a D-dimer of 7.84, ABG with a pH of 7.31, pCO2 of 49, and pO2 of 88. A CT angiogram was negative for pulmonary embolism, and a CT head was also negative. CTA of the head and neck indicated a large vessel occlusion, but CT perfusion showed no evidence of ischemia.  Neurology was consulted and a stroke workup was completed. An MRI confirmed infarcts in the right MCA, PCA, and ACA territories. Recommendations were made for a loop recorder to rule out a. Fib, she was placed on daily Plavix, as she is allergic to aspirin . The MRI did not reveal any brain metastases. The echo showed an ejection fraction of 65%, and her HbA1c was 7.4. Her hospital course was complicated by acute kidney injury, but she received IV fluid hydration and had mild hypokalemia, which was treated with potassium replacement. Prior to arrival, the patient was independent, driving, and did not use mobility devices. She resides in a two-level home with her significant other and currently requires moderate assistance with a roller walker for functional mobility and transfers. Therapy evaluations completed due to patient decreased functional mobility was admitted for a comprehensive rehab program.       Inpatient Rehabilitation Course: Lashika Erker Donaldson was admitted to rehab 05/17/2024 for inpatient therapies to consist of PT, ST and OT at least three hours five days a week. Past admission physiatrist, therapy team and rehab RN have worked together to provide customized collaborative inpatient rehab.  Anticoagulation: continue Plavix   Pain Management: continue Gabapentin  100 mg bid and tylenol  prn   Mood/Behavior/Sleep:  Abilify  20 mg daily and Amitriptyline  100 mg nightly, for mood/sleep  Skin/Wound Care: Continue with dressing changes to left elbow. Wound care/signs and symptoms of infection discussed at discharge.   GERD: continue Pepcid .   Lung CA: Recently diagnosed  with lung cancer stage IA. MRI brain no mets. Has appt with [facility] 06/12/24 Dr Kerrin.   Acute multifocal  infarct: Loop recorder placed by Dr Inocencio, EP prior to CIR- no documented Afib thus far  Continue Plavix and statin.   COPD exacerbation: Completed prednisone course and Pulmicort and DuoNeb as scheduled. Discharged on inhaled Arnuity Ellipta 2 puffs once daily.  Hypertension: remains stable without use of meds. Continue to monitor with PCP.   Hyperlipidemia: Crestor  5 mg.   T2DM: Hemoglobin A1c 7.4.  Continue metformin  XR 1000 mg twice daily.  Diabetes has been monitored with ac/hs CBG checks and SSI was use prn for tighter BS control.  SSI  discontinued his glucose remained stable.  Tobacco abuse: Continue nicotine patch   Planned Outpatient Follow-Up:   [facility]P [facility]  Neurology, Dr. Pastor   Rehab course: During patient's stay in rehab weekly team conferences were held to monitor patient's progress, set goals and discuss barriers to discharge. At admission, patient required moderate assistance with a roller walker for functional mobility and transfers.    Occupational Therapy: Patient has met 9 of 10 long term goals due to improved activity tolerance, improved balance, functional use of left upper and left lower extremity.  Patient to discharge at overall modified independent level. Patient's care partner/friend is independent  to provide the necessary cognitive assistance at discharge.  She will benefit from ongoing OT in home health setting to continue to advance functional skills in the area of BADL, iADL and reduced care partner burden.   Physical Therapy: Patient has met 8 of 9 long term goals due to improved activity tolerance, improved balance, improved postural control, increased strength, decreased pain, ability to compensate for deficits, improved attention, improved awareness, and improved coordination.  Patient to discharge at an ambulatory  level supervision. She will benefit from ongoing skilled PT services in home health setting to continue to advance safe functional mobility, address ongoing impairments in strength, ROM, balance, endurance, gait, and minimize fall risk.    Discharge plan was discussed with patient and/or family member and they verbalized understanding and agreed with it.      Disposition:  Discharge disposition: 06-Home-Health Care Svc        Diet: Diabetic   Special Instructions:  -No driving or operating heavy machinery until cleared by provider  -No smoking or alcohol or illicit drug use    Discharge Instructions     Ambulatory referral to Physical Medicine Rehab   Complete by: As directed       Allergies as of 05/31/2024       Reactions   Bc Cherry [aspirin -caffeine] Anaphylaxis   Benadryl  [diphenhydramine  Hcl] Nausea And Vomiting, Other (See Comments)   Migraine headaches.   Lansoprazole Nausea And Vomiting, Other (See Comments)   Migraine headache Prevacid* due to dye in med    Morphine Sulfate Nausea And Vomiting, Other (See Comments)   Migraine headaches.   Red Dye #40 (allura Red) Other (See Comments)   Also allergic to [facility] Dye-migraine headaches.   Simvastatin Nausea And Vomiting, Other (See Comments)   Migraine   Cephalexin    Other Reaction(s): severe fatigue   Macrolides And Ketolides Nausea And Vomiting   Other    Brown dye,medicine with brown dye   Codeine Nausea And Vomiting   Penicillins Nausea And Vomiting, Other (See Comments)   INTOLERANCE >  NAUSEA & VOMITING YEAST INFECTION > CHANGE IN NORMAL FLORA   Shellfish Allergy Other (See Comments)   Congestion Other Reaction(s): migraine   Sulfa Antibiotics Nausea And Vomiting        Medication List     STOP taking these medications    budesonide 0.25 MG/2ML nebulizer solution Commonly known as: PULMICORT   mupirocin ointment 2 % Commonly known as: BACTROBAN   predniSONE 20 MG tablet Commonly  known as: DELTASONE   valsartan-hydrochlorothiazide 320-12.5 MG tablet Commonly known as: DIOVAN-HCT       TAKE these medications    albuterol  108 (90 Base) MCG/ACT inhaler Commonly known as: VENTOLIN  HFA Inhale 1-2 puffs into the lungs every 4 (four) hours as needed for wheezing or shortness of breath.   amitriptyline  25  MG tablet Commonly known as: ELAVIL  Take 100 mg by mouth at bedtime.   ARIPiprazole  20 MG tablet Commonly known as: ABILIFY  Take 20 mg by mouth daily.   Arnuity Ellipta 100 MCG/ACT Aepb Generic drug: Fluticasone Furoate Inhale 1 puff into the lungs daily.   calcium  carbonate 500 MG chewable tablet Commonly known as: TUMS - dosed in mg elemental calcium  Chew 2 tablets by mouth 4 (four) times daily as needed for indigestion or heartburn.   Cholecalciferol  1.25 MG (50000 UT) capsule Take 50,000 Units by mouth every 7 (seven) days.   clopidogrel 75 MG tablet Commonly known as: PLAVIX Take 1 tablet (75 mg total) by mouth daily.   famotidine  20 MG tablet Commonly known as: PEPCID  Take 1 tablet (20 mg total) by mouth 2 (two) times daily. What changed: when to take this   gabapentin  100 MG capsule Commonly known as: NEURONTIN  Take 100 mg by mouth 2 (two) times daily.   insulin  aspart 100 UNIT/ML injection Commonly known as: novoLOG  CBG 70 - 120: 0 units  CBG 121 - 150: 0 units  CBG 151 - 200: 0 units  CBG 201 - 250: 2 units  CBG 251 - 300: 3 units  CBG 301 - 350: 4 units  CBG 351 - 400: 5 units   ipratropium-albuterol  0.5-2.5 (3) MG/3ML Soln Commonly known as: DUONEB Inhale 3 mLs into the lungs 2 (two) times daily. What changed:  how to take this when to take this reasons to take this   metFORMIN  500 MG 24 hr tablet Commonly known as: GLUCOPHAGE -XR Take 1,000 mg by mouth 2 (two) times daily.   nicotine 14 mg/24hr patch Commonly known as: NICODERM CQ - dosed in mg/24 hours Place 1 patch (14 mg total) onto the skin daily.   rosuvastatin   5 MG tablet Commonly known as: CRESTOR  Take 5 mg by mouth daily.        Follow-up Information     [name], FNP Follow up.   Specialty: Family Medicine Why: Call for an appointment. Contact information: [address] [place] [address] [phone]         Carilyn Prentice BRAVO, MD Follow up.   Specialty: Physical Medicine and Rehabilitation Why: Office will call for appointment Contact information: [address] [place] [place] [address] [phone]         [facility] Follow up.   Specialty: Neurology Why: Call for an appointment. Contact information: [address] [place]  [address] [phone]               Gregg Lek, MD. Schedule an appointment as soon as possible for a visit in 1 month(s).   Specialty: Neurology Contact information: [address] [place] [address] [phone]     Signed: Daphne LOISE Satterfield 05/31/2024, 10:29 AM

## 2024-05-21 NOTE — Progress Notes (Signed)
 Occupational Therapy Session Note  Patient Details  Name: [name] MRN: [id number] Date of Birth: [birth year]  Today's Date: 05/21/2024 OT Individual Time: 1103-1200 OT Individual Time Calculation (min): 57 min    Short Term Goals: Week 1:  OT Short Term Goal 1 (Week 1): Pt will complete UB dressing while sitting using hemi dressing techniques with MIN A. OT Short Term Goal 2 (Week 1): Pt will complete LB dressing while sitting using hemi dressing techniques with MIN A. OT Short Term Goal 3 (Week 1): Pt will maintain [name] in safe position when completing functional transfers with MIN verbal cues. OT Short Term Goal 4 (Week 1): Pt will complete toilet transfer utilizing LRAD with SUP with no more than MOD verbal cues.  Skilled Therapeutic Interventions/Progress Updates:    Pt received resting in WC presenting to be in good spirits receptive to skilled OT session reporting body sore from fall last night with no numerical value- OT offering intermittent rest breaks, repositioning, and therapeutic support to optimize participation in therapy session. Transported Pt to ADL apartment via [facility] d/t time management. Engaged Pt in simple meal prep activity to pick ingredients that match recipe card x3 with increased time and no errors with SUP while seated at the table. Completed a food group activity where Pt divided items into categories appropriately while seated at table with MIN questioning cues. Engaged Pt in kitchen activity to address standing balance while maintaining safe body position when completing functional reach into cabinet to retrieve items. Provided education on energy conservation strategies when in the kitchen. Transported Pt to ortho gym via WC d/t time management. Stand pivot WC>Nustep with MIN HHA for safety. Pt completed 4 min intervals x3 on level 1 to increase endurance, strength, body awareness, and increased attention to task with MIN cues for attention awareness. Stand pivot  Nustep>WC with MIN HHA with MOD cues provided for [name] awareness. Tranposrted Pt back to room via WC d/t time management. [name], seatbelt alarm on, and all needs met.    Therapy Documentation Precautions:  Precautions Precautions: Fall Recall of Precautions/Restrictions: Intact Precaution/Restrictions Comments: L hemipareisis and inattention Restrictions Weight Bearing Restrictions Per Provider Order: No  Therapy/Group: Individual Therapy  Paulina Fleeta Dixie 05/21/2024, 11:41 AM

## 2024-05-21 NOTE — Progress Notes (Signed)
 Physical Therapy Session Note  Patient Details  Name: [name] MRN: [id number] Date of Birth: [birth year]  Today's Date: 05/21/2024 PT Individual Time: 1535-1615 PT Individual Time Calculation (min): 40 min   Short Term Goals: Week 1:  PT Short Term Goal 1 (Week 1): Pt will demonstrate bed mobility with consistent SBA to bed setup as per home environment. PT Short Term Goal 2 (Week 1): Pt will demonstrate sti<>stands with increased control and consistent supervision. PT Short Term Goal 3 (Week 1): Pt will demonstrate stand pivot transfers with consistent CGA. PT Short Term Goal 4 (Week 1): Pt will ambulate more than 127ft consistently with overall CGA. PT Short Term Goal 5 (Week 1): Pt will complete flight of steps with CGA.  Skilled Therapeutic Interventions/Progress Updates:  Patient seated upright in w/c on entrance to room. Patient alert and agreeable to PT session. Has bandage over L forehead, L hand and L elbow from fall from w/c over the weekend. Pt slid from toilet while attempting to doff LB clothing.   Patient with no pain complaint at start of session.  Therapeutic Activity: Transfers: Pt performed sit<>stand transfers with improved but still uneven muscle activation from R to L sides. Improved balance noted upron standing. Initially pt demos decreased controll of descent to sit. Stand pivot transfers performed with unsafe fall into chair during session and NMR initiated for improved technique.   Gait Training:  Pt ambulated 145' x1 using RW with improving awareness of L hemibody and CGA. Guided in session with initiation of ambulation over 15 ft with no AD followed by 125 ft using RW and then  35 ft with no AD followed by 110 ft  using RW. Demonstrated short steps initially followed by improving step distance and appropriate weight shifting for more normalized step lengths. Provided vc/ tc for maintaining focus to [name] and balance performance with all ambulation with and  without AD.  Neuromuscular Re-ed: NMR facilitated during session with focus on standing balance and technique of transfers. Pt guided in sit<>stand and stand pivot transfer training with blocked practice to focus on balance and technique. Demonstrated to pt with verbal instructions re: need for weight shift. Pt able to improve with blocked practice from ModA to control descent following lateral uncontrolled descent into seat to performance with no UE support and close supervision with controlled rise/ descent.    NMR performed for improvements in motor control and coordination, balance, sequencing, judgement, and self confidence/ efficacy in performing all aspects of mobility at highest level of independence.   Patient seated upright in w/c at end of session with brakes locked, belt alarm set, and all needs within reach.   Therapy Documentation Precautions:  Precautions Precautions: Fall Recall of Precautions/Restrictions: Intact Precaution/Restrictions Comments: L hemipareisis and inattention Restrictions Weight Bearing Restrictions Per Provider Order: No  Pain: No pain related throughout session. Relates soreness in [name]. Appears that pain is helping to improve awareness of L hemibody.   Therapy/Group: Individual Therapy  Mliss DELENA Milliner PT, DPT, CSRS 05/21/2024, 9:05 AM

## 2024-05-21 NOTE — Consult Note (Signed)
 Neuropsychological Consultation Comprehensive Inpatient Rehab   Patient:   [name]   DOB:   05-14-1951  MR Number:  [id number]  Location:  [facility] [address] [place] [address] Dept: [phone] Loc: [phone]           Date of Service:   05/21/2024  Start Time:   8 AM End Time:   9 AM  Provider/Observer:  Norleen Asa, Psy.D.       Clinical Neuropsychologist       Billing Code/Service: [phone]  Reason for Service:    ELVA MAURO is a 73 year old female referred for neuropsychological consultation during her ongoing admission to the comprehensive inpatient rehabilitation unit following a stroke. The purpose of the consult is to assess cognitive and emotional coping.  HISTORY OF PRESENTING EVENT: The patient presented to the emergency department on 05/11/2024 with sudden onset dyspnea. Believed she was having an allergic reaction to BC powder taken for a headache. EMS administered Solu-Medrol , epinephrine , and Zofran . In the ED, was tachypneic, with O2 saturation at 96% on 6L O2. Code stroke was activated for left neglect and hemiparesis. Patient was outside the window for TNK. CT head was negative. CTA of head/neck showed large vessel occlusion. CT perfusion showed no evidence of ischemia. Neurology consult and stroke workup were completed. MRI confirmed infarcts in the right MCA, PCA, and ACA territories. MRI did not reveal brain metastases. Prior to this event, the patient was independent with all ADLs, driving, and did not use an assistive device. Admitted to the comprehensive rehabilitation unit due to decreased functional mobility.  RELEVANT PAST MEDICAL HISTORY: - Tobacco abuse - Squamous cell carcinoma of the lung (diagnosed 04/2024) - COPD - Coronary artery calcification - Depression - Type 2 diabetes - Hypertension - Migraines  SUBJECTIVE REPORT & BEHAVIORAL  OBSERVATIONS: Patient is alert and oriented. Reports primary post-stroke deficits are in the left arm and leg. Describes left-sided weakness and sensory changes, including reduced sensation to light touch but preserved sensation to sharp/deep pressure. Reports feeling of heaviness and uncoordination in the left limbs. Notes that motor function in the left arm has significantly improved and is now almost normal. Also reports some hearing loss in the left ear. No reported changes in vision. Reports no significant memory changes. Sleep was initially poor but has improved; takes a sleep aid for migraines. No report of unusual dreams. Mood is described as hard due to the uncertainty of recovery and trying not to compare her situation to her mother's stroke. Expressed concern about future cancer treatment and the potential for physical deconditioning during surgical recovery.  Medical History:   Past Medical History:  Diagnosis Date   Alcohol abuse, in remission    Anxiety    Cancer of sigmoid colon (HCC) 07/2005   T2N0M0   Chronic bronchitis    COPD (chronic obstructive pulmonary disease) (HCC)    Depression    Diabetes mellitus without complication (HCC)    GERD (gastroesophageal reflux disease)    History of thyroid  nodule    monitoring   Hyperlipidemia    Hypertension    Migraines    OA (osteoarthritis)    PONV (postoperative nausea and vomiting)    Reflux          Patient Active Problem List   Diagnosis Date Noted   Depression with anxiety 05/21/2024   CVA (cerebral vascular accident) (HCC) 05/17/2024   Acute CVA (cerebrovascular accident) (  HCC) 05/12/2024   Gastroesophageal reflux disease 05/11/2024   Drug-induced myopathy 05/11/2024   Diabetic foot ulcer (HCC) 05/11/2024   Benign essential hypertension 05/11/2024   Major depressive disorder, recurrent, moderate (HCC) 05/11/2024   Malignant neoplasm of lung (HCC) 05/11/2024   Thyroid  nodule 05/11/2024   Panic disorder  05/11/2024   Migraine without aura and responsive to treatment 05/11/2024   Recurrent falls 05/11/2024   Stage 2 chronic kidney disease 05/11/2024   Poorly controlled type 2 diabetes mellitus (HCC) 05/11/2024   Acute hypoxemic respiratory failure (HCC) 05/11/2024   Acute left-sided weakness 05/11/2024   AKI (acute kidney injury) 05/11/2024   Hypokalemia 05/11/2024   Pulmonary nodule 1 cm or greater in diameter 04/23/2024   Lung nodules 04/11/2024   S/P total knee arthroplasty, right 06/29/2022   Multinodular goiter 06/11/2021   Recurrent ventral incisional hernia 06/11/2021   Centrilobular emphysema (HCC) 12/13/2020   Radiculopathy 11/24/2016   H/O colon cancer, stage I 01/19/2013   Nonspecific abnormal electrocardiogram (ECG) (EKG) 03/31/2011   Chest pain 03/31/2011   Hyperlipidemia 03/31/2011   Essential hypertension 03/31/2011   Tobacco abuse 03/31/2011    IMPRESSION & SUMMARY: The patient is a 73 year old female admitted for inpatient rehabilitation following an embolic stroke affecting the right MCA, PCA, and ACA territories. She is demonstrating appropriate insight into her current deficits, which primarily involve left-sided sensorimotor changes and some left-sided hearing loss. She is actively participating in therapies and showing good early motor recovery, particularly in her left arm. Mood is understandably impacted by the sudden change in function and uncertainty about the future, compounded by a recent cancer diagnosis. She demonstrates anxiety related to her recovery trajectory and upcoming cancer treatment. Her placement on this unit, given her potential for improvement, is a positive prognostic indicator.  PLAN/INTERVENTIONS: 1. Provided psychoeducation regarding the nature of her stroke, the brain's capacity for healing (up to a year), and the rationale for her symptoms (e.g., sensory feedback disruption causing a feeling of heaviness). 2. Explained the role of the  comprehensive rehabilitation unit and the physiatry team, emphasizing that her presence on the unit reflects the team's expectation for improvements of symptoms over time. 3. Discussed the rehabilitation process, including the upcoming team conference on Wednesday to establish a projected discharge date and goals. The current expectation is an approximate 10-day stay. 4. Addressed mood and coping. Validated her distress and worry. Provided a cognitive reframing strategy: when experiencing worry, focus on the objective data that she will be better next week than she is this week. 5. Discussed stroke risk factors (A-fib, HTN) and the current medical plan to investigate etiology (e.g., loop recorder) and manage risks (e.g., blood pressure control, anticoagulation). 6. Emphasized medication adherence and safety precautions, particularly fall prevention, given her sensory deficits and use of anticoagulants. 7. Instructed the patient to report to the team if she begins to feel overwhelmed or hits a wall, as this can impede participation in therapy during the critical window for neurological recovery.  Diagnosis:    History of depression and currently depression with anxiety but not overly impacting her ability to participate in therapies         Electronically Signed   _______________________ Norleen Asa, Psy.D. Clinical Neuropsychologist

## 2024-05-21 NOTE — Progress Notes (Signed)
 PROGRESS NOTE   Subjective/Complaints:  Fall with head trauma, no acute changes on CT head Pt states she called for nsg who took her to BR, while on toilet she stood up to adjust clothing and then feet slipped, had HA initially but this has subsided with tylenol    ROS: Patient denies fever, rash, sore throat, blurred vision, dizziness, nausea, vomiting, diarrhea, cough, shortness of breath or chest pain, joint or back/neck pain, headache, or mood change.    Objective:   CT HEAD WO CONTRAST ( ) Result Date: 05/20/2024 EXAM: CT HEAD WITHOUT CONTRAST 05/20/2024 08:32:32 PM TECHNIQUE: CT of the head was performed without the administration of intravenous contrast. Automated exposure control, iterative reconstruction, and/or weight based adjustment of the mA/kV was utilized to reduce the radiation dose to as low as reasonably achievable. COMPARISON: Prior MRI from 05/12/2024. CLINICAL HISTORY: Head trauma, minor (Age >= 65y). FINDINGS: BRAIN AND VENTRICLES: No acute hemorrhage. No new large vessel territory infarct. Previously identified right cerebral infarcts are grossly similar as compared to previous MRI. No evidence for hemorrhagic transformation or significant mass effect. Changes of chronic microvascular ischemic disease again noted. Mild atheromatous change about the carotid siphons. No hydrocephalus. No extra-axial collection. No mass effect or midline shift. ORBITS: No acute abnormality. SINUSES: No acute abnormality. SOFT TISSUES AND SKULL: Vertebral volume within normal limits. No acute soft tissue abnormality. No skull fracture. IMPRESSION: 1. No new acute intracranial abnormality. 2. Grossly similar appearance of previously identified right cerebral infarcts. No significant mass effect or evidence for hemorrhagic transformation. 3. Underlying chronic microvascular ischemic disease. Electronically signed by: Morene Hoard MD  05/20/2024 08:53 PM EDT RP Workstation: [id number]   Recent Labs    05/21/24 0712  WBC 15.2*  HGB 13.0  HCT 39.0  PLT 330   Recent Labs    05/21/24 0712  NA 138  K 3.7  CL 104  CO2 24  GLUCOSE 142*  BUN 25*  CREATININE 0.99  CALCIUM  8.4*    Intake/Output Summary (Last 24 hours) at 05/21/2024 0751 Last data filed at 05/20/2024 1845 Gross per 24 hour  Intake 476 ml  Output --  Net 476 ml        Physical Exam: Vital Signs Blood pressure 132/78, pulse 62, temperature 98.5 F (36.9 C), resp. rate 18, height 5' 4 (1.626 m), weight 79.8 kg, SpO2 95%.   Constitutional: No distress . Vital signs reviewed. HEENT: NCAT, EOMI, oral membranes moist Neck: supple Cardiovascular: RRR without murmur. No JVD    Respiratory/Chest: CTA Bilaterally without wheezes or rales. Normal effort    GI/Abdomen: BS +, non-tender, non-distended Ext: no clubbing, cyanosis, or edema Psych: pleasant and cooperative  Skin: supraorbital laceration very superficial this whith some bloody oozing  [name] multiple skin tears Left forearm  Neurologic: Cranial nerves II through XII intact, motor strength is 5/5 in right and 4/5 left deltoid, bicep, tricep, grip, hip flexor, knee extensors, ankle dorsiflexor and plantar flexor Fine motor intact BUE Neg dysdiadochokinesis with RAM BUE  Cerebellar exam normal finger to nose to finger Musculoskeletal: Reduced left shoulder flexion and abduction but no pain . No joint swelling   Assessment/Plan: 1. Functional deficits which require  3+ hours per day of interdisciplinary therapy in a comprehensive inpatient rehab setting. Physiatrist is providing close team supervision and 24 hour management of active medical problems listed below. Physiatrist and rehab team continue to assess barriers to discharge/monitor patient progress toward functional and medical goals  Care Tool:  Bathing    Body parts bathed by patient: Right arm, Left arm, Chest, Abdomen, Front  perineal area, Buttocks, Right upper leg, Left upper leg, Face, Right lower leg, Left lower leg   Body parts bathed by helper: Right lower leg, Left lower leg     Bathing assist Assist Level: Moderate Assistance - Patient 50 - 74%     Upper Body Dressing/Undressing Upper body dressing   What is the patient wearing?: Pull over shirt    Upper body assist Assist Level: Moderate Assistance - Patient 50 - 74%    Lower Body Dressing/Undressing Lower body dressing      What is the patient wearing?: Pants, Underwear/pull up     Lower body assist Assist for lower body dressing: Moderate Assistance - Patient 50 - 74%     Toileting Toileting    Toileting assist Assist for toileting: Contact Guard/Touching assist     Transfers Chair/bed transfer  Transfers assist     Chair/bed transfer assist level: Minimal Assistance - Patient > 75%     Locomotion Ambulation   Ambulation assist      Assist level: Minimal Assistance - Patient > 75% Assistive device: Walker-rolling Max distance: 136 ft   Walk 10 feet activity   Assist     Assist level: Minimal Assistance - Patient > 75% Assistive device: Walker-rolling   Walk 50 feet activity   Assist    Assist level: Minimal Assistance - Patient > 75% Assistive device: Walker-rolling    Walk 150 feet activity   Assist Walk 150 feet activity did not occur: Safety/medical concerns         Walk 10 feet on uneven surface  activity   Assist Walk 10 feet on uneven surfaces activity did not occur: Safety/medical concerns         Wheelchair     Assist Is the patient using a wheelchair?: Yes (will use during stay but is not expected to use after d/c) Type of Wheelchair: Manual    Wheelchair assist level: Dependent - Patient 0%      Wheelchair 50 feet with 2 turns activity    Assist        Assist Level: Dependent - Patient 0%   Wheelchair 150 feet activity     Assist      Assist Level:  Dependent - Patient 0%   Blood pressure 132/78, pulse 62, temperature 98.5 F (36.9 C), resp. rate 18, height 5' 4 (1.626 m), weight 79.8 kg, SpO2 95%.    Medical Problem List and Plan: 1. Functional deficits secondary to right MCA, ACA and MCA/PCA infarcts embolic pattern secondary to embolic source              -patient may  shower             -ELOS/Goals: 8 to 13 days, supervision PT, OT              --Continue CIR therapies including PT, OT     2.  Antithrombotics: -DVT/anticoagulation:  Pharmaceutical: Lovenox- may d/c amb 120'             -antiplatelet therapy: Plavix (Asprin allergy)   3. Pain Management: Hx chronic pain-Gabapentin   100 mg twice daily.  Tylenol  prn   4. Mood/Behavior/Sleep: LCSW to follow for evaluation and support when available.              -antipsychotic agents: Amitriptyline  100 mg nightly, Abilify  20 mg daily   5. Neuropsych/cognition: This patient IS capable of making decisions on HER own behalf.   6. Skin/Wound Care: Routine pressure relief measures.   7. Fluids/Electrolytes/Nutrition: Monitor I&O, weights.  Labs in a.m.             -GERD: Pepcid     8. Acute multifocal focal infarct: Loop recorder to be placed by EP prior to CIR             - Continue Plavix and statin   9. COPD exacerbation:  Pulmicort and DuoNeb as scheduled. Confirmed with medicine *Prednisone 40 mg until 10/17 followed by 3 days of prednisone at 20 mg daily.             - DuoNeb as needed   10.  Lung CA: Recently diagnosed with lung cancer stage IA.  MRI brain no mets.  Follow-up with oncology   11. AKI: resolved 10/17    Latest Ref Rng & Units 05/21/2024    7:12 AM 05/18/2024    6:07 AM 05/17/2024    1:58 AM  BMP  Glucose 70 - 99 mg/dL 857  896  851   BUN 8 - 23 mg/dL 25  26  30    Creatinine 0.44 - 1.00 mg/dL 9.00  9.16  9.10   Sodium 135 - 145 mmol/L 138  138  136   Potassium 3.5 - 5.1 mmol/L 3.7  3.9  3.6   Chloride 98 - 111 mmol/L 104  101  98   CO2 22 - 32  mmol/L 24  26  23    Calcium  8.9 - 10.3 mg/dL 8.4  8.7  8.8       12. Leukocytosis: reactive secondary to steroids use.  Afebrile  -UA neg 10/14    Latest Ref Rng & Units 05/21/2024    7:12 AM 05/18/2024    6:07 AM 05/17/2024    1:58 AM  CBC  WBC 4.0 - 10.5 K/uL 15.2  15.2  14.1   Hemoglobin 12.0 - 15.0 g/dL 86.9  86.0  86.8   Hematocrit 36.0 - 46.0 % 39.0  40.2  38.7   Platelets 150 - 400 K/uL 330  332  313       13. HTN: Stable, no meds.  Long-term BP goal normotensive Vitals:   05/21/24 0424 05/21/24 0633  BP: (!) 141/78 132/78  Pulse: 60 62  Resp: 18 18  Temp: (!) 97.5 F (36.4 C) 98.5 F (36.9 C)  SpO2: 96% 95%    BP fair control , no BP meds 10/20   14.  HLD: LDL 23 on Crestor  5 mg   15.  T2DM:  A1c 7.4.  Continue CBG monitoring AC/HS with SSI.              - NovoLog  SSI, add Semglee 15 units daily, may need to adjust if am CBGs remain elevated   CBG (last 3)  Recent Labs    05/20/24 1614 05/20/24 2143 05/21/24 0631  GLUCAP 185* 167* 96    Off steroid 10/20 decrease semglee to 10u daily 16.  Tobacco abuse: Smoking cessation also provided.   -Nicotine patch      LOS: 4 days A FACE TO FACE EVALUATION WAS PERFORMED  Prentice BRAVO Hanz Winterhalter  05/21/2024, 7:51 AM

## 2024-05-21 NOTE — Group Note (Signed)
 Patient Details Name: EVOLETH NORDMEYER MRN: [id number] DOB: 08-20-1950 Today's Date: 05/21/2024  Time Calculation: OT Group Time Calculation OT Group Start Time: 1400 OT Group Stop Time: 1500 OT Group Time Calculation (min): 60 min      Group Description: Stress management: Pt participated in group session with a focus on stress mgmt, education provided on healthy coping strategies, and social interaction. Focus of session on providing coping strategies to manage new diagnosis to allow for improved mental health to increase overall quality of life . Discussed how to break down stressors into "daily hassles," "major life stressors" and "life circumstances" in an effort to allow pts to chunk their stressors into groups and determine where to best put their efforts/time when dealing with stress. Provided active listening, emotional support and therapeutic use of self. Offered education on factors that protect us  against stress such as "daily uplifts," "healthy coping strategies" and "protective factors." Encouraged all group members to make an effort to actively recall one event from their day that was a daily uplift in an effort to protect their mindset from stressors as well as sharing this information with their caregivers to facilitate improved caregiver communication and decrease overall burden of care.  Issued pt handouts on healthy coping strategies to implement into routine.  Individual level documentation: Patient participated with fair collaboration during session.   Pain: No pain   [name] 05/21/2024, 3:30 PM

## 2024-05-21 NOTE — Progress Notes (Signed)
 Physical Therapy Session Note  Patient Details  Name: [name] MRN: [id number] Date of Birth: Dec 02, 1950  {CHL IP REHAB PT TIME CALCULATION:[id number]}  Short Term Goals: Week 1:  PT Short Term Goal 1 (Week 1): Pt will demonstrate bed mobility with consistent SBA to bed setup as per home environment. PT Short Term Goal 2 (Week 1): Pt will demonstrate sti<>stands with increased control and consistent supervision. PT Short Term Goal 3 (Week 1): Pt will demonstrate stand pivot transfers with consistent CGA. PT Short Term Goal 4 (Week 1): Pt will ambulate more than 144ft consistently with overall CGA. PT Short Term Goal 5 (Week 1): Pt will complete flight of steps with CGA.  Skilled Therapeutic Interventions/Progress Updates:      Pt supine in bed upon arrival. Pt agreeable to therapy. Pt endorses 3/10 headache, premedicated. Pt endorses L LE and back soreness post fall off of toilet last night.   Discussed cause of fall-pt endorses she stood to take off her socks. Education provided regarding pt high fall risk, and impairments with emphassis on L hemi and L inattention--PT highly recommended pt do not attempt to get up by herself for pt overall safety and to reduce fall risk, and risk of injury-pt verbalized understanding and agreeable.   Pt ambulated bed to toilet with RW and min A for safety with RW and max verbal cues provided for L LE positioning with ambulation and ambulatory transfers 2/2 inattention. Pt continent of bladder, pt performed pericare while seated with supervision. Pt donned/doffed pants/shirt/socks with supervision/light min A, verbal cues provided to complete while seated to reduce fall risk. Pt required ++ time to doff pants off of left leg, verbal cue provided for technique.   Of note: pt dressing on elbow bleeding--and blood on pants, shirt and bedding. Notified nurse. Nurse present to change dressing on elbow and L eyebrow.   Pt donned shoes while seated EOB with  supervision with +++ time for L LE.   Pt ambulated room <> day room <> room with RW and CGA with heavy reliance on vision for L LE positioning, verbal cues provided for heel toe pattern and reciprocal gait. Pt performed ambulatory transfer to bed with min A for L LE positioning and safety with RW.   Pt seated in WC at end of session with all needs within reach and chiar alarm on.     Therapy Documentation Precautions:  Precautions Precautions: Fall Recall of Precautions/Restrictions: Intact Precaution/Restrictions Comments: L hemipareisis and inattention Restrictions Weight Bearing Restrictions Per Provider Order: No  Therapy/Group: Individual Therapy  [facility] Doreene Orris, [place], DPT  05/21/2024, 7:38 AM

## 2024-05-22 ENCOUNTER — Other Ambulatory Visit (HOSPITAL_COMMUNITY): Payer: Self-pay

## 2024-05-22 DIAGNOSIS — I63411 Cerebral infarction due to embolism of right middle cerebral artery: Secondary | ICD-10-CM | POA: Diagnosis not present

## 2024-05-22 DIAGNOSIS — C3490 Malignant neoplasm of unspecified part of unspecified bronchus or lung: Secondary | ICD-10-CM | POA: Diagnosis not present

## 2024-05-22 DIAGNOSIS — J449 Chronic obstructive pulmonary disease, unspecified: Secondary | ICD-10-CM | POA: Diagnosis not present

## 2024-05-22 DIAGNOSIS — D72829 Elevated white blood cell count, unspecified: Secondary | ICD-10-CM | POA: Diagnosis not present

## 2024-05-22 LAB — GLUCOSE, CAPILLARY
Glucose-Capillary: 122 mg/dL — ABNORMAL HIGH (ref 70–99)
Glucose-Capillary: 130 mg/dL — ABNORMAL HIGH (ref 70–99)
Glucose-Capillary: 146 mg/dL — ABNORMAL HIGH (ref 70–99)
Glucose-Capillary: 182 mg/dL — ABNORMAL HIGH (ref 70–99)
Glucose-Capillary: 126 mg/dL — ABNORMAL HIGH (ref 70–99)

## 2024-05-22 MED ORDER — PREDNISONE 10 MG PO TABS
10.0000 mg | ORAL_TABLET | Freq: Every day | ORAL | Status: AC
  Administered 2024-05-22 – 2024-05-24 (×3): 10 mg via ORAL
  Filled 2024-05-22 (×3): qty 1

## 2024-05-22 NOTE — Progress Notes (Incomplete)
 Inpatient Rehabilitation Discharge Medication Review by a Pharmacist  A complete drug regimen review was completed for this patient to identify any potential clinically significant medication issues.  High Risk Drug Classes Is patient taking? Indication by Medication  Antipsychotic Yes Aripiprazole  - anxiety/depression  Anticoagulant No   Antibiotic No   Opioid No   Antiplatelet Yes Clopidogrel - CVA  Hypoglycemics/insulin  Yes Insulin  Aspart - DM Metformin  - DM  Vasoactive Medication No   Chemotherapy No   Other Yes Famotidine  - reflux Alb/Atrovent - COPD Budesonide - COPD Amitriptyline  - migraine prophylaxis Calcium , Vit D - supplement Gabapentin  - neuropathic pain Nicotine patch - tobacco cessation Crestor  - HLD/CVA     Type of Medication Issue Identified Description of Issue Recommendation(s)  Drug Interaction(s) (clinically significant)     Duplicate Therapy     Allergy     No Medication Administration End Date     Incorrect Dose     Additional Drug Therapy Needed     Significant med changes from prior encounter (inform family/care partners about these prior to discharge). Valsartan/HCT PTA not restarted due to normal BP Restart outpatient as BP requires  Other       Clinically significant medication issues were identified that warrant physician communication and completion of prescribed/recommended actions by midnight of the next day:  No  Name of provider notified for urgent issues identified:   Provider Method of Notification:     Pharmacist comments:   Time spent performing this drug regimen review (minutes):  20   Jaken Fregia, [place] 05/22/2024 7:29 AM

## 2024-05-22 NOTE — Progress Notes (Signed)
 Occupational Therapy Session Note  Patient Details  Name: [name] MRN: [id number] Date of Birth: 04/24/1951  Today's Date: 05/22/2024 OT Individual Time: 9052-8940 OT Individual Time Calculation (min): 72 min    Short Term Goals: Week 1:  OT Short Term Goal 1 (Week 1): Pt will complete UB dressing while sitting using hemi dressing techniques with MIN A. OT Short Term Goal 2 (Week 1): Pt will complete LB dressing while sitting using hemi dressing techniques with MIN A. OT Short Term Goal 3 (Week 1): Pt will maintain [name] in safe position when completing functional transfers with MIN verbal cues. OT Short Term Goal 4 (Week 1): Pt will complete toilet transfer utilizing LRAD with SUP with no more than MOD verbal cues.  Skilled Therapeutic Interventions/Progress Updates:     Pt received sitting up in wc presenting to be tired, however in good spirits receptive to skilled OT session reporting 0/10 pain- OT offering intermittent rest breaks, repositioning, and therapeutic support to optimize participation in therapy session. Mild muscle soreness from previous fall present. Pt requesting to take shower this AM- focused this session on ADL retraining with emphasis on body awareness, L side attention, and dynamic balance. She completed functional mobility to bathroom using RW with CGA provided for balance and verbal cues for R LE positioning and safety. Doffed pants in standing with light MIN A to doff pants from L side of waist and doffed OH shirt with SUP for safety. Continent void in toilet completed in seated positioning CGA for sitting balance and safety. Ambulatory transfer completed toilet > TTB positioned in walk-in shower using RW and grab bars light MIN A for balance and verbal cues for technique d/t first trial of transfer. She sat for majority of bathing task for safety and energy conservation completing UB bathing with SUP- MIN-MOD verbal cues for L UE positioning during shower- and CGA  provided for standing balance when standing to wash peri-areas while holding onto grab bar. Utilized long handled sponge to wash B LEs with CGA provided for sitting balance. Dried self following shower in seated position with MOD A d/t skin tears present in L UE. Utilized L UE at a diminished level during shower with SUP. Short distance ambulatory transfer to wc TTB > wc using RW CGA for balance. Completed U/LB dressing using hemi-techniques while seated in wc. Able to weave feet into pants with MIN A and stood to sink to bring pants to waist with CGA. Donned OH shirt with MIN A to weave L UE 2/2 skin tears. Applied dressings to skin tears on L forehead, L hand, and L arm- covered arm with ACE wrap for additional coverage to protect skin. Dried hair in seated position SUP utilizing L UE during ~25% of task to stabilize hair drier. Donned socks/shoes with MOD A for time management. [name], seatbelt alarm on, [name]   Therapy Documentation Precautions:  Precautions Precautions: Fall Recall of Precautions/Restrictions: Intact Precaution/Restrictions Comments: L hemipareisis and inattention Restrictions Weight Bearing Restrictions Per Provider Order: No   Therapy/Group: Individual Therapy  [name] 05/22/2024, 10:19 AM

## 2024-05-22 NOTE — Progress Notes (Signed)
 Physical Therapy Session Note  Patient Details  Name: [name] MRN: [id number] Date of Birth: Oct 13, 1950  Today's Date: 05/22/2024 PT Individual Time: 0803-0901 PT Individual Time Calculation (min): 58 min   Short Term Goals: Week 1:  PT Short Term Goal 1 (Week 1): Pt will demonstrate bed mobility with consistent SBA to bed setup as per home environment. PT Short Term Goal 2 (Week 1): Pt will demonstrate sti<>stands with increased control and consistent supervision. PT Short Term Goal 3 (Week 1): Pt will demonstrate stand pivot transfers with consistent CGA. PT Short Term Goal 4 (Week 1): Pt will ambulate more than 126ft consistently with overall CGA. PT Short Term Goal 5 (Week 1): Pt will complete flight of steps with CGA.  Skilled Therapeutic Interventions/Progress Updates:  Patient seated upright on EOB on entrance to room. Patient alert and agreeable to PT session.   Patient with no pain complaint at start of session. Does relate slight lightheadedness.   Therapeutic Activity: Transfers: Pt performed sit<>stand and stand pivot transfers throughout session with SBA and intermittent need for CGA. Provided vc/ tc for safe technique.  Pt guided in continuous reciprocation of BUE and BLE using NuStep L3 x 33min/ L2 x with focus on maintaining pace with use of pace partner program as well as maintaining alignment of [name]. She is able to maintain an average of 48 steps/ min throughout completing 793 steps over distance of 0.4 mi. Averages 1.6 METs during bout. Requires consistent focus/ awareness of [name] position in order to maintain knee positioning in line with hip and 2nd toe.   Gait Training:  Pt ambulated 3x 100 ft with and without AD requiring overall CGA. Demonstrated decreased step lengths and increased lateral sway without AD. Requires several cues for L step length/ height during overall ambulation with AD most likely from fatigue.   Pt guided in stair training with  physical demonstration and verbal instructions provided prior to performance. Pt is able to complete twleve 6 steps using BHR with CGA to ascend leading with RLE. Then CGA to descend leading with [name]. VC provided at initiation of each direction for leading LE as wel las for clearing L heel from step during descent  Neuromuscular Re-ed: NMR facilitated during session with focus on standing balance and motor control with weight shifting. Pt guided in further sit<>stand training with education to perform with improved weight shift and technique with every performance in order to perform without need for UE assist and to strengthen [name]>RLE. Blocked practice of sit<>stand and stand pivot for improved forward lean for improved anterior weight shift. Also to assist with solidifying pt performance.   Standing balance challenged with use of tidal tank and task of maintaining level hold of tank in order to bring awareness to [name]. Dual tasking for maintaining balance over midfoot/ forefoot as pt tending to bring weight behind heels this session. Mild perturbations provided to tank and to pt with good maintenance of balance but 2 instances of posterior weight shift and inability to self correct requiring MinA to prevent LOB.   NMR performed for improvements in motor control and coordination, balance, sequencing, judgement, and self confidence/ efficacy in performing all aspects of mobility at highest level of independence.   Patient seated upright in w/c at end of session with brakes locked, belt alarm set, and all needs within reach.   Therapy Documentation Precautions:  Precautions Precautions: Fall Recall of Precautions/Restrictions: Intact Precaution/Restrictions Comments: L hemipareisis and inattention Restrictions Weight Bearing Restrictions  Per Provider Order: No  Pain:  No pain related this session.    Therapy/Group: Individual Therapy  [name] PT, DPT, CSRS 05/22/2024, 10:22 AM

## 2024-05-22 NOTE — Progress Notes (Signed)
 Physical Therapy Session Note  Patient Details  Name: [name] MRN: [id number] Date of Birth: 1951-06-20  Today's Date: 05/22/2024 PT Individual Time: 1345-1500 PT Individual Time Calculation (min): 75 min   Short Term Goals: Week 1:  PT Short Term Goal 1 (Week 1): Pt will demonstrate bed mobility with consistent SBA to bed setup as per home environment. PT Short Term Goal 2 (Week 1): Pt will demonstrate sti<>stands with increased control and consistent supervision. PT Short Term Goal 3 (Week 1): Pt will demonstrate stand pivot transfers with consistent CGA. PT Short Term Goal 4 (Week 1): Pt will ambulate more than 152ft consistently with overall CGA. PT Short Term Goal 5 (Week 1): Pt will complete flight of steps with CGA.  Skilled Therapeutic Interventions/Progress Updates:    Pt seated in w/c on arrival and agreeable to therapy. No complaint of pain.   Sit to stand throughout session to RW with CGA-supervision.   Pt ambulated to ortho gym with RW and CGA, intermittent cues for attention to L side. Pt participated in 2 x 4  and 2 x 2 laps of ~15 ft of gait, ramp, collecting and throwing horse shoe with L hand for dynamic balance, attention to L side. After 1st bout, added weaving through cones for turning practice, note required more cues for L hand turns to keep both feet inside walker. Discussed focus on task, especially when fatigued for safety and to prevent falls. On third trial, noted incr in fatigue and worsening L attention while distracted by conversation, so took rest break before final 2 horseshoes.   Transitioned to cone taps with 2 cones of different heights for dynamic balance and to work on coordination of [name]. 4 x 10. Noted most difficulty with taller cone placed on L side vs in front of R foot. Improved with repetition. Pt then ambulated back to room in same manner, returned to w/c, was left with all needs in reach and alarm active.   Therapy  Documentation Precautions:  Precautions Precautions: Fall Recall of Precautions/Restrictions: Intact Precaution/Restrictions Comments: L hemipareisis and inattention Restrictions Weight Bearing Restrictions Per Provider Order: No General:       Therapy/Group: Individual Therapy  Schuyler JAYSON Batter 05/22/2024, 1:58 PM

## 2024-05-22 NOTE — Progress Notes (Signed)
 PROGRESS NOTE   Subjective/Complaints:  Appreciate neuropsych eval   ROS: Patient denies fever, rash, sore throat, blurred vision, dizziness, nausea, vomiting, diarrhea, cough, shortness of breath or chest pain, joint or back/neck pain, headache, or mood change.    Objective:   CT HEAD WO CONTRAST ( ) Result Date: 05/20/2024 EXAM: CT HEAD WITHOUT CONTRAST 05/20/2024 08:32:32 PM TECHNIQUE: CT of the head was performed without the administration of intravenous contrast. Automated exposure control, iterative reconstruction, and/or weight based adjustment of the mA/kV was utilized to reduce the radiation dose to as low as reasonably achievable. COMPARISON: Prior MRI from 05/12/2024. CLINICAL HISTORY: Head trauma, minor (Age >= 65y). FINDINGS: BRAIN AND VENTRICLES: No acute hemorrhage. No new large vessel territory infarct. Previously identified right cerebral infarcts are grossly similar as compared to previous MRI. No evidence for hemorrhagic transformation or significant mass effect. Changes of chronic microvascular ischemic disease again noted. Mild atheromatous change about the carotid siphons. No hydrocephalus. No extra-axial collection. No mass effect or midline shift. ORBITS: No acute abnormality. SINUSES: No acute abnormality. SOFT TISSUES AND SKULL: Vertebral volume within normal limits. No acute soft tissue abnormality. No skull fracture. IMPRESSION: 1. No new acute intracranial abnormality. 2. Grossly similar appearance of previously identified right cerebral infarcts. No significant mass effect or evidence for hemorrhagic transformation. 3. Underlying chronic microvascular ischemic disease. Electronically signed by: Morene Hoard MD 05/20/2024 08:53 PM EDT RP Workstation: [id number]   Recent Labs    05/21/24 0712  WBC 15.2*  HGB 13.0  HCT 39.0  PLT 330   Recent Labs    05/21/24 0712  NA 138  K 3.7  CL 104  CO2 24   GLUCOSE 142*  BUN 25*  CREATININE 0.99  CALCIUM  8.4*    Intake/Output Summary (Last 24 hours) at 05/22/2024 0719 Last data filed at 05/21/2024 1805 Gross per 24 hour  Intake 712 ml  Output --  Net 712 ml        Physical Exam: Vital Signs Blood pressure 137/78, pulse (!) 55, temperature 97.7 F (36.5 C), temperature source Oral, resp. rate 18, height 5' 4 (1.626 m), weight 79.8 kg, SpO2 97%.    General: No acute distress Mood and affect are appropriate Heart: Regular rate and rhythm no rubs murmurs or extra sounds Lungs: Clear to auscultation, breathing unlabored, no rales or wheezes Abdomen: Positive bowel sounds, soft nontender to palpation, nondistended Extremities: No clubbing, cyanosis, or edema Skin: No evidence of breakdown, no evidence of rash  Skin: supraorbital laceration very superficial this whith some bloody oozing  [name] multiple skin tears Left forearm  Neurologic: Cranial nerves II through XII intact, motor strength is 5/5 in right and 4/5 left deltoid, bicep, tricep, grip, hip flexor, knee extensors, ankle dorsiflexor and plantar flexor Fine motor intact BUE Neg dysdiadochokinesis with RAM BUE  Cerebellar exam normal finger to nose to finger Musculoskeletal: Reduced left shoulder flexion and abduction but no pain . No joint swelling   Assessment/Plan: 1. Functional deficits which require 3+ hours per day of interdisciplinary therapy in a comprehensive inpatient rehab setting. Physiatrist is providing close team supervision and 24 hour management of active medical problems listed below. Physiatrist and rehab  team continue to assess barriers to discharge/monitor patient progress toward functional and medical goals  Care Tool:  Bathing    Body parts bathed by patient: Right arm, Left arm, Chest, Abdomen, Front perineal area, Buttocks, Right upper leg, Left upper leg, Face, Right lower leg, Left lower leg   Body parts bathed by helper: Right lower leg,  Left lower leg     Bathing assist Assist Level: Moderate Assistance - Patient 50 - 74%     Upper Body Dressing/Undressing Upper body dressing   What is the patient wearing?: Pull over shirt    Upper body assist Assist Level: Moderate Assistance - Patient 50 - 74%    Lower Body Dressing/Undressing Lower body dressing      What is the patient wearing?: Pants, Underwear/pull up     Lower body assist Assist for lower body dressing: Moderate Assistance - Patient 50 - 74%     Toileting Toileting    Toileting assist Assist for toileting: Contact Guard/Touching assist     Transfers Chair/bed transfer  Transfers assist     Chair/bed transfer assist level: Minimal Assistance - Patient > 75%     Locomotion Ambulation   Ambulation assist      Assist level: Minimal Assistance - Patient > 75% Assistive device: Walker-rolling Max distance: 136 ft   Walk 10 feet activity   Assist     Assist level: Minimal Assistance - Patient > 75% Assistive device: Walker-rolling   Walk 50 feet activity   Assist    Assist level: Minimal Assistance - Patient > 75% Assistive device: Walker-rolling    Walk 150 feet activity   Assist Walk 150 feet activity did not occur: Safety/medical concerns         Walk 10 feet on uneven surface  activity   Assist Walk 10 feet on uneven surfaces activity did not occur: Safety/medical concerns         Wheelchair     Assist Is the patient using a wheelchair?: Yes (will use during stay but is not expected to use after d/c) Type of Wheelchair: Manual    Wheelchair assist level: Dependent - Patient 0%      Wheelchair 50 feet with 2 turns activity    Assist        Assist Level: Dependent - Patient 0%   Wheelchair 150 feet activity     Assist      Assist Level: Dependent - Patient 0%   Blood pressure 137/78, pulse (!) 55, temperature 97.7 F (36.5 C), temperature source Oral, resp. rate 18, height 5'  4 (1.626 m), weight 79.8 kg, SpO2 97%.    Medical Problem List and Plan: 1. Functional deficits secondary to right MCA, ACA and MCA/PCA infarcts embolic pattern secondary to embolic source              -patient may  shower             -ELOS/Goals: 8 to 13 days, supervision PT, OT              --Continue CIR therapies including PT, OT  Care team in am    2.  Antithrombotics: -DVT/anticoagulation:  Pharmaceutical: Lovenox- may d/c amb 120'             -antiplatelet therapy: Plavix (Asprin allergy)   3. Pain Management: Hx chronic pain-Gabapentin  100 mg twice daily.  Tylenol  prn   4. Mood/Behavior/Sleep: LCSW to follow for evaluation and support when available.              -  antipsychotic agents: Amitriptyline  100 mg nightly, Abilify  20 mg daily   5. Neuropsych/cognition: This patient IS capable of making decisions on HER own behalf.   6. Skin/Wound Care: Routine pressure relief measures.   7. Fluids/Electrolytes/Nutrition: Monitor I&O, weights.  Labs in a.m.             -GERD: Pepcid     8. Acute multifocal focal infarct: Loop recorder to be placed by EP prior to CIR             - Continue Plavix and statin   9. COPD exacerbation:  Pulmicort and DuoNeb as scheduled. Confirmed with medicine *Prednisone 40 mg until 10/17 followed by 3 days of prednisone at 20 mg daily. Wheezing on left side noted on lung exam but not audible, pt states she does not feel this  Last oral prednisone 10/20, will resume taper at lower dose             - DuoNeb as needed   10.  Lung CA: Recently diagnosed with lung cancer stage IA.  MRI brain no mets.  Follow-up with oncology   11. AKI: resolved 10/17    Latest Ref Rng & Units 05/21/2024    7:12 AM 05/18/2024    6:07 AM 05/17/2024    1:58 AM  BMP  Glucose 70 - 99 mg/dL 857  896  851   BUN 8 - 23 mg/dL 25  26  30    Creatinine 0.44 - 1.00 mg/dL 9.00  9.16  9.10   Sodium 135 - 145 mmol/L 138  138  136   Potassium 3.5 - 5.1 mmol/L 3.7  3.9  3.6    Chloride 98 - 111 mmol/L 104  101  98   CO2 22 - 32 mmol/L 24  26  23    Calcium  8.9 - 10.3 mg/dL 8.4  8.7  8.8       12. Leukocytosis: reactive secondary to steroids use.  Afebrile  -UA neg 10/14    Latest Ref Rng & Units 05/21/2024    7:12 AM 05/18/2024    6:07 AM 05/17/2024    1:58 AM  CBC  WBC 4.0 - 10.5 K/uL 15.2  15.2  14.1   Hemoglobin 12.0 - 15.0 g/dL 86.9  86.0  86.8   Hematocrit 36.0 - 46.0 % 39.0  40.2  38.7   Platelets 150 - 400 K/uL 330  332  313       13. HTN: Stable, no meds.  Long-term BP goal normotensive Vitals:   05/21/24 1944 05/22/24 0535  BP: 120/84 137/78  Pulse: 84 (!) 55  Resp: 18 18  Temp: 98.3 F (36.8 C) 97.7 F (36.5 C)  SpO2: 94% 97%    BP fair control , no BP meds 10/20   14.  HLD: LDL 23 on Crestor  5 mg   15.  T2DM:  A1c 7.4.  Continue CBG monitoring AC/HS with SSI.              - NovoLog  SSI, add Semglee 15 units daily, may need to adjust if am CBGs remain elevated   CBG (last 3)  Recent Labs    05/21/24 2136 05/22/24 0620 05/22/24 0624  GLUCAP 159* 122* 130*    Off steroid 10/20 decrease semglee to 10u daily, resuming prednisone low dose expect CBG elevation  16.  Tobacco abuse: Smoking cessation also provided.   -Nicotine patch      LOS: 5 days A FACE TO FACE EVALUATION WAS PERFORMED  Prentice BRAVO Belton Peplinski 05/22/2024, 7:19 AM

## 2024-05-23 ENCOUNTER — Other Ambulatory Visit (HOSPITAL_COMMUNITY): Payer: Self-pay

## 2024-05-23 DIAGNOSIS — C3490 Malignant neoplasm of unspecified part of unspecified bronchus or lung: Secondary | ICD-10-CM | POA: Diagnosis not present

## 2024-05-23 DIAGNOSIS — J449 Chronic obstructive pulmonary disease, unspecified: Secondary | ICD-10-CM | POA: Diagnosis not present

## 2024-05-23 DIAGNOSIS — D72829 Elevated white blood cell count, unspecified: Secondary | ICD-10-CM | POA: Diagnosis not present

## 2024-05-23 DIAGNOSIS — I63411 Cerebral infarction due to embolism of right middle cerebral artery: Secondary | ICD-10-CM | POA: Diagnosis not present

## 2024-05-23 LAB — GLUCOSE, CAPILLARY
Glucose-Capillary: 74 mg/dL (ref 70–99)
Glucose-Capillary: 122 mg/dL — ABNORMAL HIGH (ref 70–99)
Glucose-Capillary: 141 mg/dL — ABNORMAL HIGH (ref 70–99)
Glucose-Capillary: 179 mg/dL — ABNORMAL HIGH (ref 70–99)
Glucose-Capillary: 141 mg/dL — ABNORMAL HIGH (ref 70–99)

## 2024-05-23 MED ORDER — METFORMIN HCL ER 500 MG PO TB24
500.0000 mg | ORAL_TABLET | Freq: Two times a day (BID) | ORAL | Status: DC
  Administered 2024-05-23 – 2024-05-27 (×10): 500 mg via ORAL
  Filled 2024-05-23 (×11): qty 1

## 2024-05-23 MED ORDER — IPRATROPIUM-ALBUTEROL 0.5-2.5 (3) MG/3ML IN SOLN
3.0000 mL | RESPIRATORY_TRACT | Status: DC | PRN
  Filled 2024-05-23: qty 3

## 2024-05-23 NOTE — Progress Notes (Signed)
 Occupational Therapy Session Note  Patient Details  Name: [name] MRN: [id number] Date of Birth: 06-Jul-1951  Today's Date: 05/23/2024 OT Individual Time: 1403-1500 OT Individual Time Calculation (min): 57 min    Short Term Goals: Week 1:  OT Short Term Goal 1 (Week 1): Pt will complete UB dressing while sitting using hemi dressing techniques with MIN A. OT Short Term Goal 2 (Week 1): Pt will complete LB dressing while sitting using hemi dressing techniques with MIN A. OT Short Term Goal 3 (Week 1): Pt will maintain [name] in safe position when completing functional transfers with MIN verbal cues. OT Short Term Goal 4 (Week 1): Pt will complete toilet transfer utilizing LRAD with SUP with no more than MOD verbal cues.  Skilled Therapeutic Interventions/Progress Updates:    Pt received resting in [facility] presenting to be in good spirits receptive to skilled OT session reporting 0/10 pain- OT offering intermittent rest breaks, repositioning, and therapeutic support to optimize participation in therapy session. Pt complete UE endurance training on arm bike in 3 min intervals with forward rotations x2 and backward rotations x2 with MIN cues for techniques and attention to [name] to increase endurance and strengthening in preparation for ADLs. Pt demonstrated difficulty problem solving with [name] to initiate forward rotation. Pt completed visual scanning activity utilizing BITS while standing utilizing RW for balance while tapping targets with R UE to increase awareness to [name] through weight bearing on RW and maintaining upright posture when standing.   88.35% accuracy 2:00 time to complete 1.31 sec reaction time 91 hits  Engaged Pt in completing Box and Blocks assessment to compare R/[name] function and fine motor skills, however requiring MIN A for technique and accuracy, completing 48 RUE, 42 [name]. Pt transported back to room vis WC d/t time management. [name],  seatbelt alarm on, and all needs met.      Therapy Documentation Precautions:  Precautions Precautions: Fall Recall of Precautions/Restrictions: Intact Precaution/Restrictions Comments: L hemipareisis and inattention Restrictions Weight Bearing Restrictions Per Provider Order: No   Therapy/Group: Individual Therapy  Paulina Fleeta Dixie 05/23/2024, 2:44 PM

## 2024-05-23 NOTE — Progress Notes (Signed)
 Patient ID: [name], female   DOB: April 30, 1951, 73 y.o.   MRN: [id number]  Met with pt and spoke with partner via telephone to discuss team conference goals of supervision and target discharge date of 10/29. Have scheduled family training for partner on Tuesday 10/28 from 2:00-4:00 pm. Discussed DME and pt has all needed equipment. Feels home health would be better and has no preference. Will work on discharge needs.

## 2024-05-23 NOTE — Progress Notes (Signed)
 Physical Therapy Session Note  Patient Details  Name: [name] MRN: [id number] Date of Birth: 02-04-1951  Today's Date: 05/23/2024 PT Individual Time: 0801-0830, 8884-8842 PT Individual Time Calculation (min): 29 min, 42 min  Short Term Goals: Week 1:  PT Short Term Goal 1 (Week 1): Pt will demonstrate bed mobility with consistent SBA to bed setup as per home environment. PT Short Term Goal 2 (Week 1): Pt will demonstrate sti<>stands with increased control and consistent supervision. PT Short Term Goal 3 (Week 1): Pt will demonstrate stand pivot transfers with consistent CGA. PT Short Term Goal 4 (Week 1): Pt will ambulate more than 153ft consistently with overall CGA. PT Short Term Goal 5 (Week 1): Pt will complete flight of steps with CGA.  Skilled Therapeutic Interventions/Progress Updates:      Treatment Session 1  Pt with tech on [facility] over toilet upon arrival. Pt agreeable to therapy. Pt denies any pain.   Pt continent of bowel and bladder.   While seated on [facility] over toilet pt changed clothes. Verbal cues provided for pt to sit while donning/doffing pants for reduced fall risk. Pt doffed pants with supervision, donned pants with min A on L LE to get pants over resistance of non slip socks--verbal cues provided for pull pant up to L ankle on L LE prior to standing to reduce fall risk.   Pt performed sit to stand with supervision, verbal cues provided for UE positioning.   Pt donned pants over buttocks while standing with RW and CGA 2/2 posterior bias, verbal cues provided for anterior weight shift.   Pt ambulated toilet to bed with RW and CGA for correction of mild posterior bias, and L LE positioning.   Pt donned socks and shoes while seated EOB with supervision.   Remainder of session focused on dynamic standing balance--and improving hip and ankle strategy:    Standing with RW and close supervision while clapping x10 B UE-no LOB noted    Standing with feet together  and raising arms over head B x10 with CGA/intermittent light min A 2/2 lateral LOB to the L.    Static standing with feet together for 1 min with close superivison/CGA   Static standing with feet shoulder width apart with eyes closed for ~30 seconds with CGA/light min A 2/2 lateral LOB to L, verbal cues provided for correction of L LE foot positioning.   Pt seated in WC at end of session with all needs within reach and seatbelt alarm on.   Treatment Session 2   Pt seated in recliner upon arrival. Pt agreeable to therapy. Pt denies any pain.   Pt ambulated room<>main gym, main gym to room with RW and CGA; verbal cues provided for forward gaze, L LE positioning and continous movement of RW. Pt requires increased cues with distractions for L LE inattnetion and pt kicking RW or crossing L LE over R LE.   Pt ambulated 3 trials x 40-100 feet with no AD and L HHA, with min-mod A for intermittent LOB 2/2 L LE crossing RLE.   Pt performed lateral stepping in // bars x2 each direction; max verbal cues provided for attention to L LE positioning and correction of stepping on R LE, and L LE ER compensation.   Pt performed toe taps, 1x5, 1x10 B to colored dots on 4 inch step for improved L LE clearance and proprioception. Verabl cues provided for L LE positioning and hip and knee clearance.   Pt seated in  WC at end of session with all needs within reach and chair alarm on.     Therapy Documentation Precautions:  Precautions Precautions: Fall Recall of Precautions/Restrictions: Intact Precaution/Restrictions Comments: L hemipareisis and inattention Restrictions Weight Bearing Restrictions Per Provider Order: No   Therapy/Group: Individual Therapy  [facility] Doreene Orris, Brock, DPT  05/23/2024, 7:49 AM

## 2024-05-23 NOTE — Patient Care Conference (Signed)
 Inpatient RehabilitationTeam Conference and Plan of Care Update Date: 05/23/2024   Time: 10:50 AM    Patient Name: [name]      Medical Record Number: [id number]  Date of Birth: 1950-09-03 Sex: Female         Room/Bed: 4M01C/4M01C-01 Payor Info: Payor: [employer]ICARE / Plan: BCBS MEDICARE / Product Type: *No Product type* /    Admit Date/Time:  05/17/2024  5:32 PM  Primary Diagnosis:  CVA (cerebral vascular accident) [facility])  Hospital Problems: Principal Problem:   CVA (cerebral vascular accident) (HCC) Active Problems:   Essential hypertension   Poorly controlled type 2 diabetes mellitus (HCC)   Depression with anxiety    Expected Discharge Date: Expected Discharge Date: 05/30/24  Team Members Present: Physician leading conference: Dr. Prentice Compton Social Worker Present: Rhoda Clement, LCSW Nurse Present: Barnie Ronde, RN PT Present: Sherlean Perks, PT OT Present: Katheryn Mines, OT SLP Present: Blaise Alderman, SLP PPS Coordinator present : Eleanor Colon, SLP     Current Status/Progress Goal Weekly Team Focus  Bowel/Bladder     Continent of bowel and bladder           Swallow/Nutrition/ Hydration               ADL's   CGA RW, MIN A ADLs//Barriers: poor insight into deficits, L inattention, poor proprioception   MOD I-SUP   L attention, ADLs, body awareness, endurance, strengthening    Mobility   Sit<>stand and stand pivot transfers with CGA/supervision and RW  Ambulating with RW and CGA 100'  Stair training x12 steps with B HR's and CGA   Supervision  safety awareness, improving her insight into deficits and limitations, L sided attention, standing balance and dynamic gait, beginning family eduation and training    Communication                Safety/Cognition/ Behavioral Observations               Pain      N/a           Skin     Abrasions to elbow and brow   Abrasions healing     Assess skin q shift     Discharge Planning:  HOme with partner who has MS and needs to be careful. Pt is doing well in therapies. Await team's recommendations for follow up and DME   Team Discussion: Patient admitted post CVA with COPD flare and new lung ca diagnosis. Progress limited by poor insight, poor proprioception and left inattention with poor safety awareness.  Patient on target to meet rehab goals: yes, currently needs Min assist for ADLs. Completes stand pivot transfers with CGA. Able to ambulate up to 100' using a RW with CGA and manage steps with bilateral railings with CGA.  Goals for discharge set for supervision - mod I overall.  *See Care Plan and progress notes for long and short-term goals.   Revisions to Treatment Plan:  Oral medications for DM management Steroid taper for COPD   Teaching Needs: Safety, medications, transfers, toileting, etc.   Current Barriers to Discharge: Decreased caregiver support and Home enviroment access/layout  Possible Resolutions to Barriers: Family education     Medical Summary Current Status: recent lung ca diagnosis,COPD on oral steroid, CBGs elevated with steroids  Barriers to Discharge: Pending chemo/radiation;Uncontrolled Diabetes   Possible Resolutions to [employer] Focus: adjust insulin  resume, metformin , stop prednisone po   Continued Need for Acute Rehabilitation Level of  Care: The patient requires daily medical management by a physician with specialized training in physical medicine and rehabilitation for the following reasons: Direction of a multidisciplinary physical rehabilitation program to maximize functional independence : Yes Medical management of patient stability for increased activity during participation in an intensive rehabilitation regime.: Yes Analysis of laboratory values and/or radiology reports with any subsequent need for medication adjustment and/or medical intervention. : Yes   I attest that I was present, lead  the team conference, and concur with the assessment and plan of the team.   Fredericka Sober B 05/23/2024, 3:23 PM

## 2024-05-23 NOTE — Progress Notes (Signed)
 Occupational Therapy Session Note  Patient Details  Name: [name] MRN: [id number] Date of Birth: 1950-09-19  Today's Date: 05/23/2024 OT Individual Time: 8482-8367 OT Individual Time Calculation (min): 75 min    Short Term Goals: Week 1:  OT Short Term Goal 1 (Week 1): Pt will complete UB dressing while sitting using hemi dressing techniques with MIN A. OT Short Term Goal 2 (Week 1): Pt will complete LB dressing while sitting using hemi dressing techniques with MIN A. OT Short Term Goal 3 (Week 1): Pt will maintain [name] in safe position when completing functional transfers with MIN verbal cues. OT Short Term Goal 4 (Week 1): Pt will complete toilet transfer utilizing LRAD with SUP with no more than MOD verbal cues.  Skilled Therapeutic Interventions/Progress Updates:    Patient reported 0/10 pain. Patient received in room ready for OT. Patient reported increased trouble with coordination. Patient able to complete clothes pin activity placing pins on several different levels to increase hand strength in affected extremity and visual perception/ coordination. Patient then completed small peg activity for spatial orientation, coordination, and sequencing with increased time required due to spatial difficulties in matching image shown. OT facilitated increased performance via indirect verbal cueing.  Patient then transitioned to completing step balance activity utilizing mirror for body orientation awareness. Patient able to step on to 6-8  step with mod A for balance with no AE for step up and step downs to increase LE proprioception required for standing ADLS, and functional mobility. Patient given tactile and verbal cues for pacing and positioning. Activity then graded to complete step up and over then completing stepping up and over to increase transfer ability with back wards stepping and body awareness. Patient able to demonstrate increased balance with readjustment with increased verbal  cueing. Patient returned to room all needs in reach alarm on.    Therapy Documentation Precautions:  Precautions Precautions: Fall Recall of Precautions/Restrictions: Intact Precaution/Restrictions Comments: L hemipareisis and inattention Restrictions Weight Bearing Restrictions Per Provider Order: No  Therapy/Group: Individual Therapy  D'mariea L Kanylah Muench 05/23/2024, 4:20 PM

## 2024-05-23 NOTE — Progress Notes (Signed)
 PROGRESS NOTE   Subjective/Complaints:  No HA, no oozing from laceration on left brow Discussed home DM regimen   ROS: Patient denies CP, SOB, N/V/D, mild constipation   Objective:   No results found.  Recent Labs    05/21/24 0712  WBC 15.2*  HGB 13.0  HCT 39.0  PLT 330   Recent Labs    05/21/24 0712  NA 138  K 3.7  CL 104  CO2 24  GLUCOSE 142*  BUN 25*  CREATININE 0.99  CALCIUM  8.4*    Intake/Output Summary (Last 24 hours) at 05/23/2024 0737 Last data filed at 05/22/2024 1803 Gross per 24 hour  Intake 477 ml  Output --  Net 477 ml        Physical Exam: Vital Signs Blood pressure 131/66, pulse (!) 58, temperature 97.6 F (36.4 C), resp. rate 17, height 5' 4 (1.626 m), weight 79.8 kg, SpO2 99%.    General: No acute distress Mood and affect are appropriate Heart: Regular rate and rhythm no rubs murmurs or extra sounds Lungs: Clear to auscultation, breathing unlabored, no rales or wheezes Abdomen: Positive bowel sounds, soft nontender to palpation, nondistended Extremities: No clubbing, cyanosis, or edema Skin: No evidence of breakdown, no evidence of rash  Skin: left periorbital ecchymosis [name] multiple skin tears Left forearm  Neurologic: Cranial nerves II through XII intact, motor strength is 5/5 in right and 4/5 left deltoid, bicep, tricep, grip, hip flexor, knee extensors, ankle dorsiflexor and plantar flexor Fine motor intact BUE  Musculoskeletal: Reduced left shoulder flexion and abduction but no pain . No joint swelling   Assessment/Plan: 1. Functional deficits which require 3+ hours per day of interdisciplinary therapy in a comprehensive inpatient rehab setting. Physiatrist is providing close team supervision and 24 hour management of active medical problems listed below. Physiatrist and rehab team continue to assess barriers to discharge/monitor patient progress toward functional and  medical goals  Care Tool:  Bathing    Body parts bathed by patient: Right arm, Left arm, Chest, Abdomen, Front perineal area, Buttocks, Right upper leg, Left upper leg, Face, Right lower leg, Left lower leg   Body parts bathed by helper: Right lower leg, Left lower leg     Bathing assist Assist Level: Moderate Assistance - Patient 50 - 74%     Upper Body Dressing/Undressing Upper body dressing   What is the patient wearing?: Pull over shirt    Upper body assist Assist Level: Moderate Assistance - Patient 50 - 74%    Lower Body Dressing/Undressing Lower body dressing      What is the patient wearing?: Pants, Underwear/pull up     Lower body assist Assist for lower body dressing: Moderate Assistance - Patient 50 - 74%     Toileting Toileting    Toileting assist Assist for toileting: Contact Guard/Touching assist     Transfers Chair/bed transfer  Transfers assist     Chair/bed transfer assist level: Minimal Assistance - Patient > 75%     Locomotion Ambulation   Ambulation assist      Assist level: Minimal Assistance - Patient > 75% Assistive device: Walker-rolling Max distance: 136 ft   Walk 10 feet  activity   Assist     Assist level: Minimal Assistance - Patient > 75% Assistive device: Walker-rolling   Walk 50 feet activity   Assist    Assist level: Minimal Assistance - Patient > 75% Assistive device: Walker-rolling    Walk 150 feet activity   Assist Walk 150 feet activity did not occur: Safety/medical concerns         Walk 10 feet on uneven surface  activity   Assist Walk 10 feet on uneven surfaces activity did not occur: Safety/medical concerns         Wheelchair     Assist Is the patient using a wheelchair?: Yes (will use during stay but is not expected to use after d/c) Type of Wheelchair: Manual    Wheelchair assist level: Dependent - Patient 0%      Wheelchair 50 feet with 2 turns activity    Assist         Assist Level: Dependent - Patient 0%   Wheelchair 150 feet activity     Assist      Assist Level: Dependent - Patient 0%   Blood pressure 131/66, pulse (!) 58, temperature 97.6 F (36.4 C), resp. rate 17, height 5' 4 (1.626 m), weight 79.8 kg, SpO2 99%.    Medical Problem List and Plan: 1. Functional deficits secondary to right MCA, ACA and MCA/PCA infarcts embolic pattern secondary to embolic source              -patient may  shower             -ELOS/Goals: 8 to 13 days, supervision PT, OT              --Continue CIR therapies including PT, OT  Care team in am    2.  Antithrombotics: -DVT/anticoagulation:  Pharmaceutical: Lovenox- may d/c amb 120'             -antiplatelet therapy: Plavix (Asprin allergy)   3. Pain Management: Hx chronic pain-Gabapentin  100 mg twice daily.  Tylenol  prn   4. Mood/Behavior/Sleep: LCSW to follow for evaluation and support when available.              -antipsychotic agents: Amitriptyline  100 mg nightly, Abilify  20 mg daily   5. Neuropsych/cognition: This patient IS capable of making decisions on HER own behalf.   6. Skin/Wound Care: Routine pressure relief measures.   7. Fluids/Electrolytes/Nutrition: Monitor I&O, weights.  Labs in a.m.             -GERD: Pepcid     8. Acute multifocal focal infarct: Loop recorder to be placed by EP prior to CIR             - Continue Plavix and statin   9. COPD exacerbation:  Pulmicort and DuoNeb as scheduled. Confirmed with medicine *Prednisone 40 mg until 10/17 followed by 3 days of prednisone at 20 mg daily. Wheezing on left side noted on lung exam but not audible, pt states she does not feel this  Last oral prednisone 10/20, will resume taper at lower dose             - DuoNeb as needed   10.  Lung CA: Recently diagnosed with lung cancer stage IA.  MRI brain no mets.  Follow-up with oncology   11. AKI: resolved 10/17    Latest Ref Rng & Units 05/21/2024    7:12 AM 05/18/2024    6:07 AM  05/17/2024    1:58 AM  BMP  Glucose 70 - 99 mg/dL 857  896  851   BUN 8 - 23 mg/dL 25  26  30    Creatinine 0.44 - 1.00 mg/dL 9.00  9.16  9.10   Sodium 135 - 145 mmol/L 138  138  136   Potassium 3.5 - 5.1 mmol/L 3.7  3.9  3.6   Chloride 98 - 111 mmol/L 104  101  98   CO2 22 - 32 mmol/L 24  26  23    Calcium  8.9 - 10.3 mg/dL 8.4  8.7  8.8       12. Leukocytosis: reactive secondary to steroids use.  Afebrile  -UA neg 10/14    Latest Ref Rng & Units 05/21/2024    7:12 AM 05/18/2024    6:07 AM 05/17/2024    1:58 AM  CBC  WBC 4.0 - 10.5 K/uL 15.2  15.2  14.1   Hemoglobin 12.0 - 15.0 g/dL 86.9  86.0  86.8   Hematocrit 36.0 - 46.0 % 39.0  40.2  38.7   Platelets 150 - 400 K/uL 330  332  313       13. HTN: Stable, no meds.  Long-term BP goal normotensive Vitals:   05/22/24 2023 05/23/24 0523  BP: 120/71 131/66  Pulse: 65 (!) 58  Resp: 18 17  Temp: 99.1 F (37.3 C) 97.6 F (36.4 C)  SpO2: 99% 99%    BP fair - good control , no BP meds 10/22   14.  HLD: LDL 23 on Crestor  5 mg   15.  T2DM:  A1c 7.4.  Continue CBG monitoring AC/HS with SSI.              - NovoLog  SSI, add Semglee 15 units daily, may need to adjust if am CBGs remain elevated   CBG (last 3)  Recent Labs    05/22/24 2047 05/23/24 0612 05/23/24 0719  GLUCAP 126* 74 122*    D/c semglee (not home med) , will be off prednisone after tomorrow am , start metformin  16.  Tobacco abuse: Smoking cessation also provided.   -Nicotine patch      LOS: 6 days A FACE TO FACE EVALUATION WAS PERFORMED  Prentice FORBES Compton 05/23/2024, 7:37 AM

## 2024-05-23 NOTE — Plan of Care (Signed)
  Problem: Consults Goal: RH STROKE PATIENT EDUCATION Description: See Patient Education module for education specifics  Outcome: Progressing   Problem: RH BOWEL ELIMINATION Goal: RH STG MANAGE BOWEL WITH ASSISTANCE Description: STG Manage Bowel with mod I Assistance. Outcome: Progressing Goal: RH STG MANAGE BOWEL W/MEDICATION W/ASSISTANCE Description: STG Manage Bowel with Medication with  mod I Assistance. Outcome: Progressing   Problem: RH SAFETY Goal: RH STG ADHERE TO SAFETY PRECAUTIONS W/ASSISTANCE/DEVICE Description: STG Adhere to Safety Precautions With cues  Assistance/Device. Outcome: Progressing   Problem: RH PAIN MANAGEMENT Goal: RH STG PAIN MANAGED AT OR BELOW PT'S PAIN GOAL Description: PAin < 4 with prns Outcome: Progressing   Problem: RH KNOWLEDGE DEFICIT Goal: RH STG INCREASE KNOWLEDGE OF DIABETES Description: Patient and S.O. will be able to manage DM using educational resources for medications and dietary modification independnetly Outcome: Progressing Goal: RH STG INCREASE KNOWLEDGE OF HYPERTENSION Description: Patient and S.O. will be able to manage HTN using educational resources for medications and dietary modification independnetly Outcome: Progressing Goal: RH STG INCREASE KNOWLEGDE OF HYPERLIPIDEMIA Description: Patient and S.O. will be able to manage HLD using educational resources for medications and dietary modification independnetly Outcome: Progressing Goal: RH STG INCREASE KNOWLEDGE OF STROKE PROPHYLAXIS Description: Patient and S.O. will be able to manage secondary risks using educational resources for medications and dietary modification independnetly Outcome: Progressing

## 2024-05-24 ENCOUNTER — Other Ambulatory Visit (HOSPITAL_COMMUNITY): Payer: Self-pay

## 2024-05-24 DIAGNOSIS — C3490 Malignant neoplasm of unspecified part of unspecified bronchus or lung: Secondary | ICD-10-CM | POA: Diagnosis not present

## 2024-05-24 DIAGNOSIS — D72829 Elevated white blood cell count, unspecified: Secondary | ICD-10-CM | POA: Diagnosis not present

## 2024-05-24 DIAGNOSIS — I63411 Cerebral infarction due to embolism of right middle cerebral artery: Secondary | ICD-10-CM | POA: Diagnosis not present

## 2024-05-24 DIAGNOSIS — J449 Chronic obstructive pulmonary disease, unspecified: Secondary | ICD-10-CM | POA: Diagnosis not present

## 2024-05-24 LAB — CBC
HCT: 39 % (ref 36.0–46.0)
Hemoglobin: 12.7 g/dL (ref 12.0–15.0)
MCH: 31.9 pg (ref 26.0–34.0)
MCHC: 32.6 g/dL (ref 30.0–36.0)
MCV: 98 fL (ref 80.0–100.0)
Platelets: 286 10*3/uL (ref 150–400)
RBC: 3.98 MIL/uL (ref 3.87–5.11)
RDW: 12.9 % (ref 11.5–15.5)
WBC: 15.6 10*3/uL — ABNORMAL HIGH (ref 4.0–10.5)
nRBC: 0 % (ref 0.0–0.2)

## 2024-05-24 LAB — GLUCOSE, CAPILLARY
Glucose-Capillary: 107 mg/dL — ABNORMAL HIGH (ref 70–99)
Glucose-Capillary: 105 mg/dL — ABNORMAL HIGH (ref 70–99)
Glucose-Capillary: 167 mg/dL — ABNORMAL HIGH (ref 70–99)
Glucose-Capillary: 133 mg/dL — ABNORMAL HIGH (ref 70–99)

## 2024-05-24 LAB — BASIC METABOLIC PANEL WITH GFR
Anion gap: 8 (ref 5–15)
BUN: 20 mg/dL (ref 8–23)
CO2: 28 mmol/L (ref 22–32)
Calcium: 8.8 mg/dL — ABNORMAL LOW (ref 8.9–10.3)
Chloride: 104 mmol/L (ref 98–111)
Creatinine, Ser: 1.03 mg/dL — ABNORMAL HIGH (ref 0.44–1.00)
GFR, Estimated: 57 mL/min — ABNORMAL LOW
Glucose, Bld: 112 mg/dL — ABNORMAL HIGH (ref 70–99)
Potassium: 4.3 mmol/L (ref 3.5–5.1)
Sodium: 140 mmol/L (ref 135–145)

## 2024-05-24 LAB — FUNGAL ORGANISM REFLEX

## 2024-05-24 LAB — FUNGUS CULTURE RESULT

## 2024-05-24 LAB — FUNGUS CULTURE WITH STAIN

## 2024-05-24 NOTE — Plan of Care (Signed)
  Problem: Consults Goal: RH STROKE PATIENT EDUCATION Description: See Patient Education module for education specifics  Outcome: Progressing   Problem: RH BOWEL ELIMINATION Goal: RH STG MANAGE BOWEL WITH ASSISTANCE Description: STG Manage Bowel with mod I Assistance. Outcome: Progressing Goal: RH STG MANAGE BOWEL W/MEDICATION W/ASSISTANCE Description: STG Manage Bowel with Medication with  mod I Assistance. Outcome: Progressing   Problem: RH SAFETY Goal: RH STG ADHERE TO SAFETY PRECAUTIONS W/ASSISTANCE/DEVICE Description: STG Adhere to Safety Precautions With cues  Assistance/Device. Outcome: Progressing   Problem: RH PAIN MANAGEMENT Goal: RH STG PAIN MANAGED AT OR BELOW PT'S PAIN GOAL Description: PAin < 4 with prns Outcome: Progressing   Problem: RH KNOWLEDGE DEFICIT Goal: RH STG INCREASE KNOWLEDGE OF DIABETES Description: Patient and S.O. will be able to manage DM using educational resources for medications and dietary modification independnetly Outcome: Progressing Goal: RH STG INCREASE KNOWLEDGE OF HYPERTENSION Description: Patient and S.O. will be able to manage HTN using educational resources for medications and dietary modification independnetly Outcome: Progressing Goal: RH STG INCREASE KNOWLEGDE OF HYPERLIPIDEMIA Description: Patient and S.O. will be able to manage HLD using educational resources for medications and dietary modification independnetly Outcome: Progressing Goal: RH STG INCREASE KNOWLEDGE OF STROKE PROPHYLAXIS Description: Patient and S.O. will be able to manage secondary risks using educational resources for medications and dietary modification independnetly Outcome: Progressing

## 2024-05-24 NOTE — Progress Notes (Signed)
 Occupational Therapy Session Note  Patient Details  Name: [name] MRN: [id number] Date of Birth: [birth year]  Today's Date: 05/24/2024 OT Individual Time: 1015-1058 OT Individual Time Calculation (min): 43 min    Short Term Goals: Week 1:  OT Short Term Goal 1 (Week 1): Pt will complete UB dressing while sitting using hemi dressing techniques with MIN A. OT Short Term Goal 2 (Week 1): Pt will complete LB dressing while sitting using hemi dressing techniques with MIN A. OT Short Term Goal 3 (Week 1): Pt will maintain [name] in safe position when completing functional transfers with MIN verbal cues. OT Short Term Goal 4 (Week 1): Pt will complete toilet transfer utilizing LRAD with SUP with no more than MOD verbal cues.  Skilled Therapeutic Interventions/Progress Updates: Patient received sitting up in the w/c motivated to participate with therapy. Patient assisted to therapy gym for BUE strength and endurance using 2 lb dowel. Set of 10 for shoulder flexion, biceps flexion, chest press, IR/ER stretch. Good tolerance and keeping bar level. Continued with FM manipulatives working on in Scientist, physiological. Used marbles for in hand storage. 9-hole peg test R -31:77 seconds; left hand- 42:74. Good participation throughout treatment. Patient has putty in room and reports continuing with HEP after formal therapies. Continue with OT POC working towards greater functional independence and neuro recovery.     Therapy Documentation Precautions:  Precautions Precautions: Fall Recall of Precautions/Restrictions: Intact Precaution/Restrictions Comments: L hemipareisis and inattention Restrictions Weight Bearing Restrictions Per Provider Order: No General:   Vital Signs: Therapy Vitals Temp: 98 F (36.7 C) Pulse Rate: 61 Resp: 18 BP: (!) 140/74 Patient Position (if appropriate): Lying Oxygen  Therapy SpO2: 99 % O2 Device: Room Air Pain: 0/10      Therapy/Group: Individual  Therapy  Isaiah JONETTA Freund 05/24/2024, 12:20 PM

## 2024-05-24 NOTE — Progress Notes (Signed)
 Physical Therapy Session Note  Patient Details  Name: [name] MRN: [id number] Date of Birth: 1951-03-15  Today's Date: 05/24/2024 PT Individual Time: 9195-9154 PT Individual Time Calculation (min): 41 min   Today's Date: 05/24/2024 PT Individual Time: 1420-1531 PT Individual Time Calculation (min): 71 min   Short Term Goals: Week 1:  PT Short Term Goal 1 (Week 1): Pt will demonstrate bed mobility with consistent SBA to bed setup as per home environment. PT Short Term Goal 2 (Week 1): Pt will demonstrate sti<>stands with increased control and consistent supervision. PT Short Term Goal 3 (Week 1): Pt will demonstrate stand pivot transfers with consistent CGA. PT Short Term Goal 4 (Week 1): Pt will ambulate more than 140ft consistently with overall CGA. PT Short Term Goal 5 (Week 1): Pt will complete flight of steps with CGA.  Skilled Therapeutic Interventions/Progress Updates:     1st Session: Pt received seated in [facility] and agrees to therapy. No complaint of pain. WC transport to gym. Pt performs stand step transfer to mat table with CGA and cues for initiation, sequencing, and positioning. Pt completes alternating LAQs seated in short sitting on mat, with emphasis on slow controlled movements with [name], especially with eccentric control of knee flexion. Pt noted to have good control, especially when cued to attend to [name]. Pt performs ~20 reps with each set. Activity progressed by having pt complete with 3lb ankle weight on [name], with pt continuing to demonstrate smooth controlled movement.   Pt stands and ambulates x60' with modA overall and no AD, with cues for upright posture to improve balance, and slight facilitation of lateral weight shifting. Pt drags toes on Lt side several times and has LOBs. Pt reports stiffness in low back. PT has pt transition to supine to perform exercises for NMR and addressing low back discomfort. Pt completes 3x10 bridges In hooklying with PT providing manual  facilitation of [name] stability.   Supine to sit with minA and cues for positioning at edge of mat. Stand pivot back to [employer] with CGA and cues for sequencing and hand placement. Pt left seated with all needs within reach.   2nd Session: Pt received seated in [facility] and agrees to therapy. No complaint of pain. WC transport to gym for time management. Pt performs stand step transfer to mat with CGA and cues for sequencing. Pt performs sit to stand multiple times during session with CGA. Pt ambulates x100' with minA at trunk to promote stability, with cues for lateral weight shifting and attending to Lt foot. Pt has several slight LOBs but generally has improved gait pattern relative to this morning's session. Pt then performs alternating foot taps on 6 step to challenge balance and provide NMR for [name]. Pt performs x20 with minA and no AD. Following rest break, PT provides mirror for visual feedback and pt completes 2x20 with cues to maintain upright gaze to improve posture and balance, and continued minA for stability.  Pt transitions to prone and completes 3x10 hamstring curls, with  2nd two sets completed with 3lb ankle weight for increased challenge as well as increased NM feedback.  Pt attempts prone press ups but reports increased pain in shoulders and low back, so activity adjusted to x10 scapular protractions on elbows.   Pt performs wall sits for NMR and core strengthening. PT demonstrates activity and then pt completes with back against column and blue platform placed under hips for rest breaks. Pt completes sets of 1:15, 0:45, 0:45, and 1:01 with  seated rest breaks between each bout.   Pt ambulates 2x175' with 3lb ankle weight on [name] to promote increased strengthening and NM feedback. Pt completes without AD, requiring minA to promote postural stability and lateral weight shifting, with cues to increase stride length Lr>Rr.   Pt completes Nustep for reciprocal coordination training. Pt completes x10:00  at workload of 5 with average steps per minute ~49. PT provides cues for hand and foot placement and completing full available ROM.   Pt transfers back to [facility] with CGA. Left seated with all needs within reach.   Therapy Documentation Precautions:  Precautions Precautions: Fall Recall of Precautions/Restrictions: Intact Precaution/Restrictions Comments: L hemipareisis and inattention Restrictions Weight Bearing Restrictions Per Provider Order: No   Therapy/Group: Individual Therapy  Elsie JAYSON Dawn, PT, DPT 05/24/2024, 4:00 PM

## 2024-05-24 NOTE — Progress Notes (Signed)
 PROGRESS NOTE   Subjective/Complaints:  No HA, no oozing from laceration on left brow Discussed home DM regimen   ROS: Patient denies CP, SOB, N/V/D, mild constipation   Objective:   No results found.  Recent Labs    05/24/24 0542  WBC 15.6*  HGB 12.7  HCT 39.0  PLT 286   Recent Labs    05/24/24 0542  NA 140  K 4.3  CL 104  CO2 28  GLUCOSE 112*  BUN 20  CREATININE 1.03*  CALCIUM  8.8*    Intake/Output Summary (Last 24 hours) at 05/24/2024 0720 Last data filed at 05/23/2024 1750 Gross per 24 hour  Intake 720 ml  Output --  Net 720 ml        Physical Exam: Vital Signs Blood pressure (!) 140/74, pulse 61, temperature 98 F (36.7 C), resp. rate 18, height 5' 4 (1.626 m), weight 79.8 kg, SpO2 99%.    General: No acute distress Mood and affect are appropriate Heart: Regular rate and rhythm no rubs murmurs or extra sounds Lungs: Clear to auscultation, breathing unlabored, no rales or wheezes Abdomen: Positive bowel sounds, soft nontender to palpation, nondistended Extremities: No clubbing, cyanosis, or edema Skin: No evidence of breakdown, no evidence of rash  Skin: left periorbital ecchymosis [name] multiple skin tears Left forearm  Neurologic: Cranial nerves II through XII intact, motor strength is 5/5 in right and 4/5 left deltoid, bicep, tricep, grip, hip flexor, knee extensors, ankle dorsiflexor and plantar flexor Fine motor intact BUE  Musculoskeletal: Reduced left shoulder flexion and abduction but no pain . No joint swelling   Assessment/Plan: 1. Functional deficits which require 3+ hours per day of interdisciplinary therapy in a comprehensive inpatient rehab setting. Physiatrist is providing close team supervision and 24 hour management of active medical problems listed below. Physiatrist and rehab team continue to assess barriers to discharge/monitor patient progress toward functional and  medical goals  Care Tool:  Bathing    Body parts bathed by patient: Right arm, Left arm, Chest, Abdomen, Front perineal area, Buttocks, Right upper leg, Left upper leg, Face, Right lower leg, Left lower leg   Body parts bathed by helper: Right lower leg, Left lower leg     Bathing assist Assist Level: Moderate Assistance - Patient 50 - 74%     Upper Body Dressing/Undressing Upper body dressing   What is the patient wearing?: Pull over shirt    Upper body assist Assist Level: Moderate Assistance - Patient 50 - 74%    Lower Body Dressing/Undressing Lower body dressing      What is the patient wearing?: Pants, Underwear/pull up     Lower body assist Assist for lower body dressing: Moderate Assistance - Patient 50 - 74%     Toileting Toileting    Toileting assist Assist for toileting: Contact Guard/Touching assist     Transfers Chair/bed transfer  Transfers assist     Chair/bed transfer assist level: Minimal Assistance - Patient > 75%     Locomotion Ambulation   Ambulation assist      Assist level: Minimal Assistance - Patient > 75% Assistive device: Walker-rolling Max distance: 136 ft   Walk 10 feet  activity   Assist     Assist level: Minimal Assistance - Patient > 75% Assistive device: Walker-rolling   Walk 50 feet activity   Assist    Assist level: Minimal Assistance - Patient > 75% Assistive device: Walker-rolling    Walk 150 feet activity   Assist Walk 150 feet activity did not occur: Safety/medical concerns         Walk 10 feet on uneven surface  activity   Assist Walk 10 feet on uneven surfaces activity did not occur: Safety/medical concerns         Wheelchair     Assist Is the patient using a wheelchair?: Yes (will use during stay but is not expected to use after d/c) Type of Wheelchair: Manual    Wheelchair assist level: Dependent - Patient 0%      Wheelchair 50 feet with 2 turns activity    Assist         Assist Level: Dependent - Patient 0%   Wheelchair 150 feet activity     Assist      Assist Level: Dependent - Patient 0%   Blood pressure (!) 140/74, pulse 61, temperature 98 F (36.7 C), resp. rate 18, height 5' 4 (1.626 m), weight 79.8 kg, SpO2 99%.    Medical Problem List and Plan: 1. Functional deficits secondary to right MCA, ACA and MCA/PCA infarcts embolic pattern secondary to embolic source              -patient may  shower             -ELOS/Goals: 05/30/24, supervision PT, OT              --Continue CIR therapies including PT, OT  Wants to do OP therapy after d/c but discussed no driving due to L neglect    2.  Antithrombotics: -DVT/anticoagulation:  Pharmaceutical: Lovenox- may d/c amb 120'             -antiplatelet therapy: Plavix (Asprin allergy)   3. Pain Management: Hx chronic pain-Gabapentin  100 mg twice daily.  Tylenol  prn   4. Mood/Behavior/Sleep: LCSW to follow for evaluation and support when available.              -antipsychotic agents: Abilify  20 mg daily  Amitriptyline  100 mg nightly, for mood/sleep 5. Neuropsych/cognition: This patient IS capable of making decisions on HER own behalf.   6. Skin/Wound Care: Routine pressure relief measures.   7. Fluids/Electrolytes/Nutrition: Monitor I&O, weights.  Labs in a.m.             -GERD: Pepcid     8. Acute multifocal focal infarct: Loop recorder to be placed by EP prior to CIR             - Continue Plavix and statin   9. COPD exacerbation:  Pulmicort and DuoNeb as scheduled. Confirmed with medicine *Prednisone 40 mg until 10/17 followed by 3 days of prednisone at 20 mg daily. Wheezing on left side noted on lung exam but not audible, pt states she does not feel this  Last oral prednisone 10/20, will resume taper at lower dose             - DuoNeb as needed   10.  Lung CA: Recently diagnosed with lung cancer stage IA.  MRI brain no mets.  Has appt with [facility] 06/12/24 Dr Kerrin   11. AKI:  resolved 10/17    Latest Ref Rng & Units 05/24/2024    5:42 AM 05/21/2024  7:12 AM 05/18/2024    6:07 AM  BMP  Glucose 70 - 99 mg/dL 887  857  896   BUN 8 - 23 mg/dL 20  25  26    Creatinine 0.44 - 1.00 mg/dL 8.96  9.00  9.16   Sodium 135 - 145 mmol/L 140  138  138   Potassium 3.5 - 5.1 mmol/L 4.3  3.7  3.9   Chloride 98 - 111 mmol/L 104  104  101   CO2 22 - 32 mmol/L 28  24  26    Calcium  8.9 - 10.3 mg/dL 8.8  8.4  8.7       12. Leukocytosis: reactive secondary to steroids use.  Afebrile  -UA neg 10/14    Latest Ref Rng & Units 05/24/2024    5:42 AM 05/21/2024    7:12 AM 05/18/2024    6:07 AM  CBC  WBC 4.0 - 10.5 K/uL 15.6  15.2  15.2   Hemoglobin 12.0 - 15.0 g/dL 87.2  86.9  86.0   Hematocrit 36.0 - 46.0 % 39.0  39.0  40.2   Platelets 150 - 400 K/uL 286  330  332       13. HTN: Stable, no meds.  Long-term BP goal normotensive Vitals:   05/23/24 2007 05/24/24 0600  BP:  (!) 140/74  Pulse:  61  Resp:  18  Temp:  98 F (36.7 C)  SpO2: 96% 99%    BP fair - good control , no BP meds 10/22   14.  HLD: LDL 23 on Crestor  5 mg   15.  T2DM:  A1c 7.4.  Continue CBG monitoring AC/HS with SSI.              - NovoLog  SSI, add Semglee 15 units daily, may need to adjust if am CBGs remain elevated   CBG (last 3)  Recent Labs    05/23/24 1642 05/23/24 2057 05/24/24 0629  GLUCAP 179* 141* 107*    D/c semglee (not home med) , will be off prednisone after tomorrow am , start metformin  16.  Tobacco abuse: Smoking cessation also provided.   -Nicotine patch      LOS: 7 days A FACE TO FACE EVALUATION WAS PERFORMED  Prentice FORBES Compton 05/24/2024, 7:20 AM

## 2024-05-24 NOTE — Progress Notes (Signed)
 Occupational Therapy Session Note  Patient Details  Name: [name] MRN: [id number] Date of Birth: 1950/09/10  Today's Date: 05/24/2024 OT Individual Time:  -    915-10 (45 min)   Short Term Goals: Week 1:  OT Short Term Goal 1 (Week 1): Pt will complete UB dressing while sitting using hemi dressing techniques with MIN A. OT Short Term Goal 2 (Week 1): Pt will complete LB dressing while sitting using hemi dressing techniques with MIN A. OT Short Term Goal 3 (Week 1): Pt will maintain [name] in safe position when completing functional transfers with MIN verbal cues. OT Short Term Goal 4 (Week 1): Pt will complete toilet transfer utilizing LRAD with SUP with no more than MOD verbal cues. Week 2:     Skilled Therapeutic Interventions/Progress Updates:    1:1 Pt received in the chair and reports needing to go to the bathroom. Pt ambulated to the bathroom with RW with contact guard and performed toileting with contact guard including hygiene after BM. Pt transitioned into the shower with contact guard. Pt reports that her only lasting deficit is the incoordination in left LE and reports its from her lower back. Pt continues to require cues for left body awareness, assistance to orient clothing and identify that she is threading both legs in one leg hole. Pt with difficuly with FMC to untie shoe. Pt able to don but required A to tie back on her left foot.  Pt able to brush and dry her hair sitting at the sink.  Instead of the dressing and ace wrap- small bandages placed on scabs on left UE and donned elbow padded protector with Nursing and half lap tray placed on the left side to protect UE  Therapy Documentation Precautions:  Precautions Precautions: Fall Recall of Precautions/Restrictions: Intact Precaution/Restrictions Comments: L hemipareisis and inattention Restrictions Weight Bearing Restrictions Per Provider Order: No  Pain:  No reports of pain in session    Therapy/Group:  Individual Therapy  [name] [facility] 05/24/2024, 10:51 AM

## 2024-05-24 NOTE — Progress Notes (Signed)
 Patient ID: [name], female   DOB: [birth year], 73 y.o.   MRN: [id number] [facility] has accepted the referral for home health.

## 2024-05-25 ENCOUNTER — Other Ambulatory Visit (HOSPITAL_COMMUNITY): Payer: Self-pay

## 2024-05-25 DIAGNOSIS — I63411 Cerebral infarction due to embolism of right middle cerebral artery: Secondary | ICD-10-CM | POA: Diagnosis not present

## 2024-05-25 DIAGNOSIS — J449 Chronic obstructive pulmonary disease, unspecified: Secondary | ICD-10-CM | POA: Diagnosis not present

## 2024-05-25 DIAGNOSIS — C3490 Malignant neoplasm of unspecified part of unspecified bronchus or lung: Secondary | ICD-10-CM | POA: Diagnosis not present

## 2024-05-25 DIAGNOSIS — D72829 Elevated white blood cell count, unspecified: Secondary | ICD-10-CM | POA: Diagnosis not present

## 2024-05-25 LAB — GLUCOSE, CAPILLARY
Glucose-Capillary: 153 mg/dL — ABNORMAL HIGH (ref 70–99)
Glucose-Capillary: 121 mg/dL — ABNORMAL HIGH (ref 70–99)
Glucose-Capillary: 102 mg/dL — ABNORMAL HIGH (ref 70–99)
Glucose-Capillary: 116 mg/dL — ABNORMAL HIGH (ref 70–99)

## 2024-05-25 NOTE — Plan of Care (Signed)
  Problem: Consults Goal: RH STROKE PATIENT EDUCATION Description: See Patient Education module for education specifics  Outcome: Progressing   Problem: RH BOWEL ELIMINATION Goal: RH STG MANAGE BOWEL WITH ASSISTANCE Description: STG Manage Bowel with mod I Assistance. Outcome: Progressing Goal: RH STG MANAGE BOWEL W/MEDICATION W/ASSISTANCE Description: STG Manage Bowel with Medication with  mod I Assistance. Outcome: Progressing   Problem: RH SAFETY Goal: RH STG ADHERE TO SAFETY PRECAUTIONS W/ASSISTANCE/DEVICE Description: STG Adhere to Safety Precautions With cues  Assistance/Device. Outcome: Progressing   Problem: RH PAIN MANAGEMENT Goal: RH STG PAIN MANAGED AT OR BELOW PT'S PAIN GOAL Description: PAin < 4 with prns Outcome: Progressing   Problem: RH KNOWLEDGE DEFICIT Goal: RH STG INCREASE KNOWLEDGE OF DIABETES Description: Patient and S.O. will be able to manage DM using educational resources for medications and dietary modification independnetly Outcome: Progressing Goal: RH STG INCREASE KNOWLEDGE OF HYPERTENSION Description: Patient and S.O. will be able to manage HTN using educational resources for medications and dietary modification independnetly Outcome: Progressing Goal: RH STG INCREASE KNOWLEGDE OF HYPERLIPIDEMIA Description: Patient and S.O. will be able to manage HLD using educational resources for medications and dietary modification independnetly Outcome: Progressing Goal: RH STG INCREASE KNOWLEDGE OF STROKE PROPHYLAXIS Description: Patient and S.O. will be able to manage secondary risks using educational resources for medications and dietary modification independnetly Outcome: Progressing

## 2024-05-25 NOTE — Progress Notes (Signed)
 Occupational Therapy Session Note  Patient Details  Name: [name] MRN: [id number] Date of Birth: January 26, 1951  Today's Date: 05/25/2024 OT Individual Time: 9152-9056 OT Individual Time Calculation (min): 56 min    Short Term Goals: Week 1:  OT Short Term Goal 1 (Week 1): Pt will complete UB dressing while sitting using hemi dressing techniques with MIN A. OT Short Term Goal 2 (Week 1): Pt will complete LB dressing while sitting using hemi dressing techniques with MIN A. OT Short Term Goal 3 (Week 1): Pt will maintain [name] in safe position when completing functional transfers with MIN verbal cues. OT Short Term Goal 4 (Week 1): Pt will complete toilet transfer utilizing LRAD with SUP with no more than MOD verbal cues.  Skilled Therapeutic Interventions/Progress Updates:  Pt greeted seated in w/c, pt agreeable to OT intervention.      Transfers/bed mobility/functional mobility:  Pt completed functional ambulation greater than a household distance with RW and CGA   NMR:  Pt completed targeted stepping  task with [name] with pt instructed to step to target on floor to facilitate improved motor planning in [name] with BUE support.  Graded task up and instructed pt to step to target and then reach dynamically with [name] to remove squigz on mirror. Pt completed task with CGA.  Pt then able to complete same task while stepping over 3 inch obstacle, noted decreased GM movement in [name] when stepping over taller obstacle. Overall CGA.    Pt completed step ups to 3 inch step while reaching to retrieve horseshoes from [facility] with [name]. Pt completed task with MINA, MIN verbal cues needed to correct positioning of [name] during stepping task.    Exercises: pt completed below BUE therex with 3lb weighted dowel in standing to challenge balance and global endurance:  X20 chest presses  X20 forward rows  X20 bicep curls X20 shoulder abd/add                   Ended session with pt seated in w/c with all  needs within reach and safety belt alarm activated.                    Therapy Documentation Precautions:  Precautions Precautions: Fall Recall of Precautions/Restrictions: Intact Precaution/Restrictions Comments: L hemipareisis and inattention Restrictions Weight Bearing Restrictions Per Provider Order: No  Pain: No pain    Therapy/Group: Individual Therapy  [name] [facility] 05/25/2024, 12:01 PM

## 2024-05-25 NOTE — Progress Notes (Signed)
 Physical Therapy Session Note  Patient Details  Name: [name] MRN: [id number] Date of Birth: 08/09/1950  Today's Date: 05/25/2024 PT Individual Time: 8384-8342 PT Individual Time Calculation (min): 42 min   Short Term Goals: Week 1:  PT Short Term Goal 1 (Week 1): Pt will demonstrate bed mobility with consistent SBA to bed setup as per home environment. PT Short Term Goal 2 (Week 1): Pt will demonstrate sti<>stands with increased control and consistent supervision. PT Short Term Goal 3 (Week 1): Pt will demonstrate stand pivot transfers with consistent CGA. PT Short Term Goal 4 (Week 1): Pt will ambulate more than 18ft consistently with overall CGA. PT Short Term Goal 5 (Week 1): Pt will complete flight of steps with CGA.  Skilled Therapeutic Interventions/Progress Updates:      Pt sitting in wheelchair to start - she has no reports of pain. L arm is tucked awkwardly between her thigh and the arm rest - no injury but encouraged her to be more attentive to her L side.   Transported patient in the wheelchair to the main gym. Sit<>stand with CGA from w/c - cues for monitoring foot placemen to ensure her L foot is fully placed before standing.   Gait training >160ft with light minA and no AD or UE support - worked on quick turns to the L, stop/go, sideways stepping, fast/slow, and backwards ambulation. Pt needing minA for dynamic gait for balance recovery. Delayed stepping strategies to the L for her balance recovery.   Pt instructed in sideways stepping (to her L for ascent) up 6 stairs while facing the hand rail. She needed minA for balance while stepping up/down sideways x12 steps. Patient has coordination difficulties with her L foot and overcrowds her feet at times.   Pt completed standing UE Nustep Ergometer with BUE - focused on cardiovascular endurance and building activity tolerance. Emphasis on equal proplusion with [name] vs RUE with balanced power programing. Completed L2  resistance and a total of x5 minutes minutes.   Pt returned to her room and she was left sitting up in the wheelchair with the seat belt alarm on. Needs met.   Therapy Documentation Precautions:  Precautions Precautions: Fall Recall of Precautions/Restrictions: Intact Precaution/Restrictions Comments: L hemipareisis and inattention Restrictions Weight Bearing Restrictions Per Provider Order: No General:      Therapy/Group: Individual Therapy  Sherlean SHAUNNA Perks 05/25/2024, 7:50 AM

## 2024-05-25 NOTE — Progress Notes (Signed)
 Occupational Therapy Session Note  Patient Details  Name: [name] MRN: [id number] Date of Birth: [birth year]  Today's Date: 05/25/2024 OT Individual Time: 1123-1207 OT Individual Time Calculation (min): 44 min    Short Term Goals: Week 1:  OT Short Term Goal 1 (Week 1): Pt will complete UB dressing while sitting using hemi dressing techniques with MIN A. OT Short Term Goal 2 (Week 1): Pt will complete LB dressing while sitting using hemi dressing techniques with MIN A. OT Short Term Goal 3 (Week 1): Pt will maintain [name] in safe position when completing functional transfers with MIN verbal cues. OT Short Term Goal 4 (Week 1): Pt will complete toilet transfer utilizing LRAD with SUP with no more than MOD verbal cues.  Skilled Therapeutic Interventions/Progress Updates:    Patient agreeable to participate in OT session. Reports 0/10 pain level.   Patient participated in skilled OT session focusing on dynamic balance, functional transfers, UE coordination. Patient received from room. Patient completed functional mobility with agility latter with slow pacing and HHA to increase LE proprioception, body awareness and balance. Patient then able to complete tossing activity with bean bags for UE coordination. Patient demonstrated fair ability to toss with B/L UE. patient completed bead threading activity for increased bilateral coordination related to ADLs. Patient demonstrated increased ability to transfer completing stand/ squat pivot with SUP to CGA. Patient then completed functional mobility back to room with SUP to CGA. Patient returned to wc all needs in reach alarm on.   Therapy Documentation Precautions:  Precautions Precautions: Fall Recall of Precautions/Restrictions: Intact Precaution/Restrictions Comments: L hemipareisis and inattention Restrictions Weight Bearing Restrictions Per Provider Order: No  Therapy/Group: Individual Therapy  [name] 05/25/2024, 12:40 PM

## 2024-05-25 NOTE — Progress Notes (Signed)
 PROGRESS NOTE   Subjective/Complaints:  No issues overnite  Breathing well, no wheezing Discussed CBG management  No bowel issues reported   ROS: Patient denies CP, SOB, N/V/D, mild constipation   Objective:   No results found.  Recent Labs    05/24/24 0542  WBC 15.6*  HGB 12.7  HCT 39.0  PLT 286   Recent Labs    05/24/24 0542  NA 140  K 4.3  CL 104  CO2 28  GLUCOSE 112*  BUN 20  CREATININE 1.03*  CALCIUM  8.8*    Intake/Output Summary (Last 24 hours) at 05/25/2024 0719 Last data filed at 05/24/2024 1300 Gross per 24 hour  Intake 480 ml  Output --  Net 480 ml        Physical Exam: Vital Signs Blood pressure (!) 143/81, pulse 63, temperature 98 F (36.7 C), resp. rate 18, height 5' 4 (1.626 m), weight 79.8 kg, SpO2 97%.    General: No acute distress Mood and affect are appropriate Heart: Regular rate and rhythm no rubs murmurs or extra sounds Lungs: Clear to auscultation, breathing unlabored, no rales or wheezes Abdomen: Positive bowel sounds, soft nontender to palpation, nondistended Extremities: No clubbing, cyanosis, or edema Skin: No evidence of breakdown, no evidence of rash  Skin: left periorbital ecchymosis [name] multiple skin tears Left forearm  Neurologic: Cranial nerves II through XII intact, motor strength is 5/5 in right and 4/5 left deltoid, bicep, tricep, grip, hip flexor, knee extensors, ankle dorsiflexor and plantar flexor Fine motor intact BUE  Musculoskeletal: Reduced left shoulder flexion and abduction but no pain . No joint swelling   Assessment/Plan: 1. Functional deficits which require 3+ hours per day of interdisciplinary therapy in a comprehensive inpatient rehab setting. Physiatrist is providing close team supervision and 24 hour management of active medical problems listed below. Physiatrist and rehab team continue to assess barriers to discharge/monitor patient  progress toward functional and medical goals  Care Tool:  Bathing    Body parts bathed by patient: Right arm, Left arm, Chest, Abdomen, Front perineal area, Buttocks, Right upper leg, Left upper leg, Face, Right lower leg, Left lower leg   Body parts bathed by helper: Right lower leg, Left lower leg     Bathing assist Assist Level: Contact Guard/Touching assist     Upper Body Dressing/Undressing Upper body dressing   What is the patient wearing?: Pull over shirt    Upper body assist Assist Level: Contact Guard/Touching assist    Lower Body Dressing/Undressing Lower body dressing      What is the patient wearing?: Pants, Underwear/pull up     Lower body assist Assist for lower body dressing: Contact Guard/Touching assist     Toileting Toileting    Toileting assist Assist for toileting: Contact Guard/Touching assist     Transfers Chair/bed transfer  Transfers assist     Chair/bed transfer assist level: Contact Guard/Touching assist     Locomotion Ambulation   Ambulation assist      Assist level: Minimal Assistance - Patient > 75% Assistive device: Walker-rolling Max distance: 136 ft   Walk 10 feet activity   Assist     Assist level: Minimal Assistance -  Patient > 75% Assistive device: Walker-rolling   Walk 50 feet activity   Assist    Assist level: Minimal Assistance - Patient > 75% Assistive device: Walker-rolling    Walk 150 feet activity   Assist Walk 150 feet activity did not occur: Safety/medical concerns         Walk 10 feet on uneven surface  activity   Assist Walk 10 feet on uneven surfaces activity did not occur: Safety/medical concerns         Wheelchair     Assist Is the patient using a wheelchair?: Yes (will use during stay but is not expected to use after d/c) Type of Wheelchair: Manual    Wheelchair assist level: Dependent - Patient 0%      Wheelchair 50 feet with 2 turns activity    Assist         Assist Level: Dependent - Patient 0%   Wheelchair 150 feet activity     Assist      Assist Level: Dependent - Patient 0%   Blood pressure (!) 143/81, pulse 63, temperature 98 F (36.7 C), resp. rate 18, height 5' 4 (1.626 m), weight 79.8 kg, SpO2 97%.    Medical Problem List and Plan: 1. Functional deficits secondary to right MCA, ACA and MCA/PCA infarcts embolic pattern secondary to embolic source              -patient may  shower             -ELOS/Goals: 05/30/24, supervision PT, OT              --Continue CIR therapies including PT, OT  Wants to do OP therapy after d/c but discussed no driving due to L neglect    2.  Antithrombotics: -DVT/anticoagulation:  Pharmaceutical: Lovenox- may d/c amb 120'             -antiplatelet therapy: Plavix (Asprin allergy)   3. Pain Management: Hx chronic pain-Gabapentin  100 mg twice daily.  Tylenol  prn   4. Mood/Behavior/Sleep: LCSW to follow for evaluation and support when available.              -antipsychotic agents: Abilify  20 mg daily  Amitriptyline  100 mg nightly, for mood/sleep 5. Neuropsych/cognition: This patient IS capable of making decisions on HER own behalf.   6. Skin/Wound Care: Routine pressure relief measures.   7. Fluids/Electrolytes/Nutrition: Monitor I&O, weights.  Labs in a.m.             -GERD: Pepcid     8. Acute multifocal focal infarct: Loop recorder to be placed by EP prior to CIR             - Continue Plavix and statin   9. COPD exacerbation:  Pulmicort and DuoNeb as scheduled. Confirmed with medicine *Prednisone 40 mg until 10/17 followed by 3 days of prednisone at 20 mg daily. Wheezing on left side noted on lung exam but not audible, pt states she does not feel this  Last oral prednisone 10/20, will resume taper at lower dose             - DuoNeb as needed   10.  Lung CA: Recently diagnosed with lung cancer stage IA.  MRI brain no mets.  Has appt with [facility] 06/12/24 Dr Kerrin   11. AKI:  resolved 10/17    Latest Ref Rng & Units 05/24/2024    5:42 AM 05/21/2024    7:12 AM 05/18/2024    6:07 AM  BMP  Glucose 70 - 99 mg/dL 887  857  896   BUN 8 - 23 mg/dL 20  25  26    Creatinine 0.44 - 1.00 mg/dL 8.96  9.00  9.16   Sodium 135 - 145 mmol/L 140  138  138   Potassium 3.5 - 5.1 mmol/L 4.3  3.7  3.9   Chloride 98 - 111 mmol/L 104  104  101   CO2 22 - 32 mmol/L 28  24  26    Calcium  8.9 - 10.3 mg/dL 8.8  8.4  8.7       12. Leukocytosis: reactive secondary to steroids use.  Afebrile  -UA neg 10/14    Latest Ref Rng & Units 05/24/2024    5:42 AM 05/21/2024    7:12 AM 05/18/2024    6:07 AM  CBC  WBC 4.0 - 10.5 K/uL 15.6  15.2  15.2   Hemoglobin 12.0 - 15.0 g/dL 87.2  86.9  86.0   Hematocrit 36.0 - 46.0 % 39.0  39.0  40.2   Platelets 150 - 400 K/uL 286  330  332    Repeat Monday expect a  normalization of WBCs off prednisone   13. HTN: Stable, no meds.  Long-term BP goal normotensive Vitals:   05/24/24 2032 05/25/24 0427  BP: 134/82 (!) 143/81  Pulse: 94 63  Resp:  18  Temp:  98 F (36.7 C)  SpO2: 98% 97%    BP fair - good control , no BP meds 10/22   14.  HLD: LDL 23 on Crestor  5 mg   15.  T2DM:  A1c 7.4.  Continue CBG monitoring AC/HS with SSI.              - NovoLog  SSI, add Semglee 15 units daily, may need to adjust if am CBGs remain elevated   CBG (last 3)  Recent Labs    05/24/24 1141 05/24/24 1631 05/24/24 2151  GLUCAP 105* 167* 133*    D/c semglee (not home med) , will be off prednisone after tomorrow am , start metformin  XR 500mg  BID 10/22 (home dose 1000mg  BID may adjust if needed) 16.  Tobacco abuse: Smoking cessation also provided.   -Nicotine patch      LOS: 8 days A FACE TO FACE EVALUATION WAS PERFORMED  Prentice FORBES Compton 05/25/2024, 7:19 AM

## 2024-05-26 DIAGNOSIS — I639 Cerebral infarction, unspecified: Secondary | ICD-10-CM | POA: Diagnosis not present

## 2024-05-26 LAB — GLUCOSE, CAPILLARY
Glucose-Capillary: 127 mg/dL — ABNORMAL HIGH (ref 70–99)
Glucose-Capillary: 101 mg/dL — ABNORMAL HIGH (ref 70–99)
Glucose-Capillary: 150 mg/dL — ABNORMAL HIGH (ref 70–99)
Glucose-Capillary: 109 mg/dL — ABNORMAL HIGH (ref 70–99)

## 2024-05-26 NOTE — Progress Notes (Signed)
 Occupational Therapy Session Note  Patient Details  Name: [name] MRN: [id number] Date of Birth: [birth year]  Today's Date: 05/26/2024 OT Individual Time: 8948-8793 OT Individual Time Calculation (min): 75 min    Short Term Goals: Week 1:  OT Short Term Goal 1 (Week 1): Pt will complete UB dressing while sitting using hemi dressing techniques with MIN A. OT Short Term Goal 2 (Week 1): Pt will complete LB dressing while sitting using hemi dressing techniques with MIN A. OT Short Term Goal 3 (Week 1): Pt will maintain [name] in safe position when completing functional transfers with MIN verbal cues. OT Short Term Goal 4 (Week 1): Pt will complete toilet transfer utilizing LRAD with SUP with no more than MOD verbal cues.  Skilled Therapeutic Interventions/Progress Updates:     Initial Encounter: Patient seated at w/c LOF at the time of arrival. The patient reports resting during the night with no pain to report at the time of treatment.  The pt was in agreement with working on UB strengthening  and attention to the L side of the body  The pt was able to complete UB exercises with attention on maintianing symmetry for BUE using a 1lb dowel for 2 sets of 10 for shld flex, horizontal abduction, shld rotation, and lg circles with rest breaks as needed, the pt required 3 rest breaks . The pt was instructed to incorporate relaxation breathing and was able to demonstrated effective carryover. The pt went on to complete a dowel  rod exercise of maintaining shld flexion with external force exerted on the dowel  5x for for a count of 10, the pt required 3 rest breaks.   The pt went on to retrieve the Siu[employer]z pegs  from a table top, based on the colors called out while alternating hands. The pt completed a table top activity for identifying letters dispersed on a piece of paper for  circling that were called out by the instructed with additional attention given to the L side for > accuracy.  Last but not  least, the patient  completed a standing task for maintaining  upright positioning against external force in various planes while hold the RW and adjusting her feet and trunk to improve his  standing balance for gains safety, to reduce his instances for falls.  At the end of the session, the pt remained at w/c LOF with all additional needs address. Therapy Documentation Precautions:  Precautions Precautions: Fall Recall of Precautions/Restrictions: Intact Precaution/Restrictions Comments: L hemipareisis and inattention Restrictions Weight Bearing Restrictions Per Provider Order: No  Therapy/Group: Individual Therapy  Elvera JONETTA Mace 05/26/2024, 12:46 PM

## 2024-05-26 NOTE — Progress Notes (Signed)
 Physical Therapy Session Note  Patient Details  Name: [name] MRN: [id number] Date of Birth: November 01, 1950  Today's Date: 05/25/2024 PT Individual Time: 8581-8482 PT Individual Time Calculation (min): 59 min   Short Term Goals: Week 1:  PT Short Term Goal 1 (Week 1): Pt will demonstrate bed mobility with consistent SBA to bed setup as per home environment. PT Short Term Goal 1 - Progress (Week 1): Met PT Short Term Goal 2 (Week 1): Pt will demonstrate sti<>stands with increased control and consistent supervision. PT Short Term Goal 2 - Progress (Week 1): Met PT Short Term Goal 3 (Week 1): Pt will demonstrate stand pivot transfers with consistent CGA. PT Short Term Goal 3 - Progress (Week 1): Met PT Short Term Goal 4 (Week 1): Pt will ambulate more than 127ft consistently with overall CGA. PT Short Term Goal 4 - Progress (Week 1): Met PT Short Term Goal 5 (Week 1): Pt will complete flight of steps with CGA. PT Short Term Goal 5 - Progress (Week 1): Progressing toward goal Week 2:  PT Short Term Goal 1 (Week 2): STG = LTG d/t ELOS  Skilled Therapeutic Interventions/Progress Updates:  Patient seated upright in w/c on entrance to room. Patient alert and agreeable to PT session.   Patient with no pain complaint at start of session.  Therapeutic Activity: Transfers: Pt performed sit<>stand transfers with SBA throughout session and requiring intermittent need for cueing to prevent uncontrolled descent to sit. Also requires cues to improve technique with rise to stand with further scoot to edge of seat, then more posterior positioning of feet prior to rise to stand. Stand pivot transfers throughout session with SBA/ CGA and need for cues to increase awareness and attn to [name] and position in/ out of BOS of RW.   Gait Training:  Pt ambulated >200 ft x2 using new personal RW with overall SBA and intermittent need for CGA d/t crossover stepping in turns and potential LOB d/t NBOS. Pt unaware  of [name] positioning until losing balance to L and unable to produce corrective lateral step with [name] requiring MinA to maintain balance.  Neuromuscular Re-ed: NMR facilitated during session with focus on standing balance, proprioception, reactive balance. Pt guided in bean bag toss with no AD to target board using RUE to toss with underhand swing with coordinated [name] dynamic step forward. Pt demonstrating difficulty initially and unable to maintain balance with [name] step out and LOB to L side. Significantly delayed reaction with no corrective balance strategy noted. ModA to maintain balance. Improves with vc/ tc for [name] step length and position in step out. Reminded pt that with ALL mobility using L side, she will have to bring increased focus to movements intended. Also guided in use of reacher to retrieve bags from target board. Requires continued vc for attn to L hemibody as she catches foot/ toe on corner of board. Also cued re: safety awareness for improved positioning to move around board to improve proximity to bags instead of attempting to reach all from increased distance.  NMR performed for improvements in motor control and coordination, balance, sequencing, judgement, and self confidence/ efficacy in performing all aspects of mobility at highest level of independence.   Patient seated upright in w/c at end of session with brakes locked, belt alarm set, and all needs within reach.   Therapy Documentation Precautions:  Precautions Precautions: Fall Recall of Precautions/Restrictions: Intact Precaution/Restrictions Comments: L hemipareisis and inattention Restrictions Weight Bearing Restrictions Per Provider Order: No  Pain:  No pain related this session.   Therapy/Group: Individual Therapy  Mliss DELENA Milliner PT, DPT, CSRS 05/25/2024, 7:58 AM

## 2024-05-26 NOTE — Progress Notes (Signed)
 Physical Therapy Weekly Progress Note  Patient Details  Name: [name] MRN: [id number] Date of Birth: [birth year]  Beginning of progress report period: May 18, 2024 End of progress report period: May 26, 2024  Today's Date: 05/26/2024  Patient has met 4 of 5 short term goals.  Pt making appropriate progress towards goals and is on track to meet LTG. She has not missed any therapy and is very motivated to improve LOA required and progress. She completes bed mobility with supervision, sit<>stand transfers with SBA, and stand<>pivot transfers with SBA/ CGA using a RW for stability. She's ambulating up to 215 ft with SBA/ CGA using RW. She has also shown ability to navigate twelve 6-in steps with SBA/ CGA using Bil handrails. She continues to be primarily limited by premorbid deconditioning, body habitus, decreased dynamic standing balance, reduced awareness and attention to L hemibody, [name] and [name] weakness, and gait/ blanace impairments. Caregiver/ family  have not yet participated in family education to prepare for d/c home. Expected to observe for family education on Tuesday just prior to d/c. 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   Patient continues to demonstrate the following deficits muscle weakness, decreased cardiorespiratoy endurance, impaired timing and sequencing, unbalanced muscle activation, and decreased coordination, decreased attention to left, decreased attention, decreased awareness, decreased problem solving, and decreased safety awareness, and decreased standing balance, hemiplegia, and decreased balance strategies and therefore will continue to benefit from skilled PT intervention to increase functional independence with mobility.  Patient progressing toward long term goals..  Continue plan of care.  PT Short Term Goals Week 1:  PT Short Term Goal 1 (Week 1): Pt will demonstrate bed mobility with consistent SBA to bed setup as per home  environment. PT Short Term Goal 1 - Progress (Week 1): Met PT Short Term Goal 2 (Week 1): Pt will demonstrate sti<>stands with increased control and consistent supervision. PT Short Term Goal 2 - Progress (Week 1): Met PT Short Term Goal 3 (Week 1): Pt will demonstrate stand pivot transfers with consistent CGA. PT Short Term Goal 3 - Progress (Week 1): Met PT Short Term Goal 4 (Week 1): Pt will ambulate more than 139ft consistently with overall CGA. PT Short Term Goal 4 - Progress (Week 1): Met PT Short Term Goal 5 (Week 1): Pt will complete flight of steps with CGA. PT Short Term Goal 5 - Progress (Week 1): Progressing toward goal Week 2:  PT Short Term Goal 1 (Week 2): STG = LTG d/t ELOS  Skilled Therapeutic Interventions/Progress Updates:  Ambulation/gait training;Community reintegration;DME/adaptive equipment instruction;Neuromuscular re-education;Psychosocial support;Stair training;UE/LE Strength taining/ROM;UE/LE Coordination activities;Therapeutic Activities;Pain management;Discharge planning;Balance/vestibular training;Cognitive remediation/compensation;Disease management/prevention;Functional mobility training;Patient/family education;Splinting/orthotics;Therapeutic Exercise   Therapy Documentation Precautions:  Precautions Precautions: Fall Recall of Precautions/Restrictions: Intact Precaution/Restrictions Comments: L hemipareisis and inattention Restrictions Weight Bearing Restrictions Per Provider Order: No  Pain: Pain Assessment Pain Scale: 0-10 Pain Score: 0-No pain related in general. Recent injuries healing well.    Therapy/Group: Individual Therapy  [name] PT, DPT, CSRS 05/26/2024, 12:20 PM

## 2024-05-26 NOTE — Progress Notes (Signed)
 PROGRESS NOTE   Subjective/Complaints:  No events overnight.  No acute complaints. Vitals stable     05/26/2024    5:22 AM 05/25/2024    7:48 PM 05/25/2024    1:45 PM  Vitals with BMI  Systolic 143 121 879  Diastolic 85 80 73  Pulse 72 78 90    Recent Labs    05/25/24 1705 05/25/24 2113 05/26/24 0630  GLUCAP 102* 116* 127*     Continent of bladder  Last BM 10-19 per chart; patient endorses it was 3 days ago.  No feelings of constipation, bloating, or nausea.  ROS: Patient denies CP, SOB, N/V/D  Objective:   No results found.  Recent Labs    05/24/24 0542  WBC 15.6*  HGB 12.7  HCT 39.0  PLT 286   Recent Labs    05/24/24 0542  NA 140  K 4.3  CL 104  CO2 28  GLUCOSE 112*  BUN 20  CREATININE 1.03*  CALCIUM  8.8*    Intake/Output Summary (Last 24 hours) at 05/26/2024 0831 Last data filed at 05/25/2024 1300 Gross per 24 hour  Intake 240 ml  Output --  Net 240 ml        Physical Exam: Vital Signs Blood pressure (!) 143/85, pulse 72, temperature 97.8 F (36.6 C), resp. rate 18, height 5' 4 (1.626 m), weight 79.8 kg, SpO2 95%.    General: No acute distress.  Sitting up at bedside. Mood and affect are appropriate Heart: Regular rate and rhythm no rubs murmurs or extra sounds Lungs: Clear to auscultation, breathing unlabored, no rales or wheezes Abdomen: Positive bowel sounds, soft nontender to palpation, nondistended Extremities: No clubbing, cyanosis, or edema Skin: No evidence of breakdown, no evidence of rash  Skin: left periorbital ecchymosis [name] multiple skin tears Left forearm --covered in clean dressing Neurologic: Cranial nerves II through XII intact, motor strength is 5/5 in right and 4/5 left deltoid, bicep, tricep, grip, hip flexor, knee extensors, ankle dorsiflexor and plantar flexor Fine motor intact BUE Musculoskeletal: Reduced left shoulder flexion and abduction but no  pain . No joint swelling   Physical exam unchanged from the above on reexamination 05/26/24    Assessment/Plan: 1. Functional deficits which require 3+ hours per day of interdisciplinary therapy in a comprehensive inpatient rehab setting. Physiatrist is providing close team supervision and 24 hour management of active medical problems listed below. Physiatrist and rehab team continue to assess barriers to discharge/monitor patient progress toward functional and medical goals  Care Tool:  Bathing    Body parts bathed by patient: Right arm, Left arm, Chest, Abdomen, Front perineal area, Buttocks, Right upper leg, Left upper leg, Face, Right lower leg, Left lower leg   Body parts bathed by helper: Right lower leg, Left lower leg     Bathing assist Assist Level: Contact Guard/Touching assist     Upper Body Dressing/Undressing Upper body dressing   What is the patient wearing?: Pull over shirt    Upper body assist Assist Level: Contact Guard/Touching assist    Lower Body Dressing/Undressing Lower body dressing      What is the patient wearing?: Pants, Underwear/pull up  Lower body assist Assist for lower body dressing: Contact Guard/Touching assist     Toileting Toileting    Toileting assist Assist for toileting: Contact Guard/Touching assist     Transfers Chair/bed transfer  Transfers assist     Chair/bed transfer assist level: Contact Guard/Touching assist     Locomotion Ambulation   Ambulation assist      Assist level: Minimal Assistance - Patient > 75% Assistive device: Walker-rolling Max distance: 136 ft   Walk 10 feet activity   Assist     Assist level: Minimal Assistance - Patient > 75% Assistive device: Walker-rolling   Walk 50 feet activity   Assist    Assist level: Minimal Assistance - Patient > 75% Assistive device: Walker-rolling    Walk 150 feet activity   Assist Walk 150 feet activity did not occur: Safety/medical  concerns         Walk 10 feet on uneven surface  activity   Assist Walk 10 feet on uneven surfaces activity did not occur: Safety/medical concerns         Wheelchair     Assist Is the patient using a wheelchair?: Yes (will use during stay but is not expected to use after d/c) Type of Wheelchair: Manual    Wheelchair assist level: Dependent - Patient 0%      Wheelchair 50 feet with 2 turns activity    Assist        Assist Level: Dependent - Patient 0%   Wheelchair 150 feet activity     Assist      Assist Level: Dependent - Patient 0%   Blood pressure (!) 143/85, pulse 72, temperature 97.8 F (36.6 C), resp. rate 18, height 5' 4 (1.626 m), weight 79.8 kg, SpO2 95%.    Medical Problem List and Plan: 1. Functional deficits secondary to right MCA, ACA and MCA/PCA infarcts embolic pattern secondary to embolic source              -patient may  shower -ELOS/Goals: 05/30/24, supervision PT, OT --Continue CIR therapies including PT, OT  Wants to do OP therapy after d/c but discussed no driving due to L neglect    2.  Antithrombotics: -DVT/anticoagulation:  Pharmaceutical: Lovenox- may d/c amb 120'             -antiplatelet therapy: Plavix (Asprin allergy)   3. Pain Management: Hx chronic pain-Gabapentin  100 mg twice daily.  Tylenol  prn   4. Mood/Behavior/Sleep: LCSW to follow for evaluation and support when available.              -antipsychotic agents: Abilify  20 mg daily  Amitriptyline  100 mg nightly, for mood/sleep  - 10-25: Slept well per sleep log 5. Neuropsych/cognition: This patient IS capable of making decisions on HER own behalf.   6. Skin/Wound Care: Routine pressure relief measures.   7. Fluids/Electrolytes/Nutrition: Monitor I&O, weights.  Labs in a.m.             -GERD: Pepcid     8. Acute multifocal focal infarct: Loop recorder to be placed by EP prior to CIR             - Continue Plavix and statin   9. COPD exacerbation:   Pulmicort and DuoNeb as scheduled. Confirmed with medicine *Prednisone 40 mg until 10/17 followed by 3 days of prednisone at 20 mg daily. Wheezing on left side noted on lung exam but not audible, pt states she does not feel this  Last oral prednisone 10/20, will resume  taper at lower dose             - DuoNeb as needed   10.  Lung CA: Recently diagnosed with lung cancer stage IA.  MRI brain no mets.  Has appt with [facility] 06/12/24 Dr Kerrin   11. AKI: resolved 10/17    Latest Ref Rng & Units 05/24/2024    5:42 AM 05/21/2024    7:12 AM 05/18/2024    6:07 AM  BMP  Glucose 70 - 99 mg/dL 887  857  896   BUN 8 - 23 mg/dL 20  25  26    Creatinine 0.44 - 1.00 mg/dL 8.96  9.00  9.16   Sodium 135 - 145 mmol/L 140  138  138   Potassium 3.5 - 5.1 mmol/L 4.3  3.7  3.9   Chloride 98 - 111 mmol/L 104  104  101   CO2 22 - 32 mmol/L 28  24  26    Calcium  8.9 - 10.3 mg/dL 8.8  8.4  8.7       12. Leukocytosis: reactive secondary to steroids use.  Afebrile  -UA neg 10/14    Latest Ref Rng & Units 05/24/2024    5:42 AM 05/21/2024    7:12 AM 05/18/2024    6:07 AM  CBC  WBC 4.0 - 10.5 K/uL 15.6  15.2  15.2   Hemoglobin 12.0 - 15.0 g/dL 87.2  86.9  86.0   Hematocrit 36.0 - 46.0 % 39.0  39.0  40.2   Platelets 150 - 400 K/uL 286  330  332    Repeat Monday expect a  normalization of WBCs off prednisone   13. HTN: Stable, no meds.  Long-term BP goal normotensive Vitals:   05/25/24 2014 05/26/24 0522  BP:  (!) 143/85  Pulse:  72  Resp:  18  Temp:  97.8 F (36.6 C)  SpO2: 97% 95%    BP fair - good control , no BP meds    14.  HLD: LDL 23 on Crestor  5 mg   15.  T2DM:  A1c 7.4.  Continue CBG monitoring AC/HS with SSI.              - NovoLog  SSI, add Semglee 15 units daily, may need to adjust if am CBGs remain elevated   CBG (last 3)  Recent Labs    05/25/24 1705 05/25/24 2113 05/26/24 0630  GLUCAP 102* 116* 127*    D/c semglee (not home med) , will be off prednisone after tomorrow am ,  start metformin  XR 500mg  BID 10/22 (home dose 1000mg  BID may adjust if needed)  10-25: Blood sugars under good control  16.  Tobacco abuse: Smoking cessation also provided.   -Nicotine patch      LOS: 9 days A FACE TO FACE EVALUATION WAS PERFORMED  Joesph JAYSON Likes 05/26/2024, 8:31 AM

## 2024-05-26 NOTE — Progress Notes (Signed)
 Physical Therapy Session Note  Patient Details  Name: [name] MRN: [id number] Date of Birth: 1951-04-14  Today's Date: 05/26/2024 PT Individual Time: 0802-0905 PT Individual Time Calculation (min): 63 min   Short Term Goals: Week 1:  PT Short Term Goal 1 (Week 1): Pt will demonstrate bed mobility with consistent SBA to bed setup as per home environment. PT Short Term Goal 1 - Progress (Week 1): Met PT Short Term Goal 2 (Week 1): Pt will demonstrate sti<>stands with increased control and consistent supervision. PT Short Term Goal 2 - Progress (Week 1): Met PT Short Term Goal 3 (Week 1): Pt will demonstrate stand pivot transfers with consistent CGA. PT Short Term Goal 3 - Progress (Week 1): Met PT Short Term Goal 4 (Week 1): Pt will ambulate more than 133ft consistently with overall CGA. PT Short Term Goal 4 - Progress (Week 1): Met PT Short Term Goal 5 (Week 1): Pt will complete flight of steps with CGA. PT Short Term Goal 5 - Progress (Week 1): Progressing toward goal Week 2:  PT Short Term Goal 1 (Week 2): STG = LTG d/t ELOS  Skilled Therapeutic Interventions/Progress Updates:  Patient seated on EOB on entrance to room. Patient alert and agreeable to PT session. Is perturbed as she requested assistance to get ready prior to therapy but did not get help and still needs to change out of pajamas.   Patient with no pain complaint at start of session.  Therapeutic Activity: Initiated session with how pt will safely navigate room to gather needed items to get dressed in preparation for return home. Put RW within reach from seated position on EOB. Pt brings RW in front of her with vc, but continues to require vc in order to bring attn to placement/ position of RW and [name]. Pt looks and makes minor adjustments but does not recognize that L foot is outside of RW. Requires continued vc with visual cue to recognize foot placement and adjust for increased safety. Is able to ambulate around end  of bed but then attempts to force RW into tight space in order to position self in front of dresser. Stopped pt and provided with questioning cue re: more safe option to navigate space. Requires instruction for sidestepping with RW through space and performing again once turned around and returning to far EOB. Pt initiates dressing and is able to doff t shirt but then requires MinA to orient button down shirt and don. Unable to button this day and so requires MaxA to button. Can doff pants with SBA and use of RW to balance in transfers from EOB. Dons pants with supervision and extra time. She did forget socks and requires vc to return ambulate to dresser and acquire socks. Then sits to EOB and is able to don with time and vc. Dons socks and shoes with supervision and extra time to position each LE in fig 4 to assist.   Transfers: Pt performed sit<>stand and stand pivot transfers throughout session with SBA. Provided vc/ tc for technique and effort as she continues to demoreach for RW to stand and then uncontrolled return to sit.   Pt taken to main therapy gym to educate and practice floor transfer. Demonstrated to pt for visual and verbal instruction prior to attempt. Then pt able to slowly reach floor. Instructed and provided with tc to scoot while long sitting to better position beside mat table to utilize for UE support. Then pt able to bringhips up sideways from  floor and on elbows. Pushed up to quadruped and is able to reach tall kneeling with supervision and vc. Is able to bring RLE forward for half kneel position and requires MinA/ CGA to rise on BLE, bring feet closer together and then swing hips to side for seat to mat table. Relates some discomfort with being on knees, but this subsides with time and distraction.   Pt relates that neighbor will be picking her up in low car. Car transfer practiced from lowest position and pt able to enter/ exit car with overall supervision.   Gait Training/ NMR:   Pt ambulated 125' x1/ 120' x1 using RW with SBA and intermittent CGA to prevent fall during turns. Pt demos crossover stepping when attempting to turn corner.  Provided vc/ tc for increasing balance and safety with maintenance of wider BOS. Guided and educated in wide turns requiring only ambulation as well as blocked practice of stepping initial outside LE to maintain wide BOS and to what happens to balance with performance of crossover stepping.   Patient seated upright in w/c at end of session with brakes locked, belt alarm set, and all needs within reach.   Therapy Documentation Precautions:  Precautions Precautions: Fall Recall of Precautions/Restrictions: Intact Precaution/Restrictions Comments: L hemipareisis and inattention Restrictions Weight Bearing Restrictions Per Provider Order: No  Pain: Pain Assessment Pain Scale: 0-10 Pain Score: 0-No pain    Therapy/Group: Individual Therapy  Mliss DELENA Milliner PT, DPT, CSRS 05/26/2024, 12:19 PM

## 2024-05-26 NOTE — Plan of Care (Signed)
  Problem: Consults Goal: RH STROKE PATIENT EDUCATION Description: See Patient Education module for education specifics  Outcome: Progressing   Problem: RH BOWEL ELIMINATION Goal: RH STG MANAGE BOWEL WITH ASSISTANCE Description: STG Manage Bowel with mod I Assistance. Outcome: Progressing Goal: RH STG MANAGE BOWEL W/MEDICATION W/ASSISTANCE Description: STG Manage Bowel with Medication with  mod I Assistance. Outcome: Progressing   Problem: RH SAFETY Goal: RH STG ADHERE TO SAFETY PRECAUTIONS W/ASSISTANCE/DEVICE Description: STG Adhere to Safety Precautions With cues  Assistance/Device. Outcome: Progressing   Problem: RH PAIN MANAGEMENT Goal: RH STG PAIN MANAGED AT OR BELOW PT'S PAIN GOAL Description: PAin < 4 with prns Outcome: Progressing   Problem: RH KNOWLEDGE DEFICIT Goal: RH STG INCREASE KNOWLEDGE OF DIABETES Description: Patient and S.O. will be able to manage DM using educational resources for medications and dietary modification independnetly Outcome: Progressing Goal: RH STG INCREASE KNOWLEDGE OF HYPERTENSION Description: Patient and S.O. will be able to manage HTN using educational resources for medications and dietary modification independnetly Outcome: Progressing Goal: RH STG INCREASE KNOWLEGDE OF HYPERLIPIDEMIA Description: Patient and S.O. will be able to manage HLD using educational resources for medications and dietary modification independnetly Outcome: Progressing Goal: RH STG INCREASE KNOWLEDGE OF STROKE PROPHYLAXIS Description: Patient and S.O. will be able to manage secondary risks using educational resources for medications and dietary modification independnetly Outcome: Progressing

## 2024-05-27 ENCOUNTER — Other Ambulatory Visit (HOSPITAL_COMMUNITY): Payer: Self-pay

## 2024-05-27 DIAGNOSIS — I639 Cerebral infarction, unspecified: Secondary | ICD-10-CM | POA: Diagnosis not present

## 2024-05-27 LAB — GLUCOSE, CAPILLARY
Glucose-Capillary: 210 mg/dL — ABNORMAL HIGH (ref 70–99)
Glucose-Capillary: 110 mg/dL — ABNORMAL HIGH (ref 70–99)
Glucose-Capillary: 114 mg/dL — ABNORMAL HIGH (ref 70–99)
Glucose-Capillary: 155 mg/dL — ABNORMAL HIGH (ref 70–99)

## 2024-05-27 NOTE — Progress Notes (Addendum)
 Physical Therapy Session Note  Patient Details  Name: [name] MRN: [id number] Date of Birth: Dec 01, 1950  Today's Date: 05/27/2024 PT Individual Time: 0917-1000 PT Individual Time Calculation (min): 43 min   Short Term Goals: Week 2:  PT Short Term Goal 1 (Week 2): STG = LTG d/t ELOS  Skilled Therapeutic Interventions/Progress Updates: Pt presents sitting in w/c and agreeable to therapy.  Pt doffs slipper socks and initiates donning shoes w/ A from PT to complete.  Pt transfers sit to stand w/ close supervision.  Pt amb to main gym w/ RW and CGA.  Pt requires cues for maintaining [name] w/in RW during turns.  Pt states awareness.  Pt performed 5 STS in 14 seconds.  Pt negotiated cone obstacle course w/ RW and CGA/close sup w/ cues for [name] placement.  Pt cued for cadence w/ [name].  Pt negotiated cone course w/o AD and min A, cues for posture and for [name] placement.  Pt negotiated 12 steps w/ B rails, self-selecting reciprocal gait ascending but educated on safety as [name] is not always planted fully on step before advancing R.  Pt performs step-to descending and also when cued ascending, but the reverts to reciprocal.  Pt amb back to room w/ CGA and cues for scanning.  Seat alarm on and all needs in reach.     Therapy Documentation Precautions:  Precautions Precautions: Fall Recall of Precautions/Restrictions: Intact Precaution/Restrictions Comments: L hemipareisis and inattention Restrictions Weight Bearing Restrictions Per Provider Order: No General:   Vital Signs:   Pain:0/10 Pain Assessment Pain Scale: 0-10 Pain Score: 0-No pain   Therapy/Group: Individual Therapy  Dee Maday P Vaudine Dutan 05/27/2024, 12:47 PM

## 2024-05-27 NOTE — Progress Notes (Signed)
 Physical Therapy Session Note  Patient Details  Name: [name] MRN: [id number] Date of Birth: 09/01/1950  Today's Date: 05/27/2024 PT Individual Time: 1400-1441 PT Individual Time Calculation (min): 41 min   Short Term Goals: Week 2:  PT Short Term Goal 1 (Week 2): STG = LTG d/t ELOS  Skilled Therapeutic Interventions/Progress Updates:      Pt sitting in wheelchair to start. She had dropped her notebook on the ground and had the appropriate safety awareness to wait for staff to pick it up for her. Pt denies any pain.  Used the RW during session to emphasize safety and familiarity with AD during functional mobility.   Sit<>stand to RW with supervision from w/c. Needs cues to monitor her feet to ensure both feet are inside the walker as she tends to keep L foot outside resulting in trip hazard.   Practiced ambulation from her room to outside (standing rest break in elevator) with CGA to SBA with the RW. Cues for safety, awareness, upright posture, reducing her downward gaze, and monitoring her feet with turns. No seated rest break until outdoors, distances > 375ft.   While outside, practiced community mobility and reintegration with unlevel gait training and navigating unfamiliar environments. She needed CGA for safe gait while outdoors with the RW - pt mindful of cracks and slopes with surfaces. She had x1 LOB to the L while doing head turns and being distracted, needing minA for balance recovery. While outdoors, we also spent time discussing CVA risk factors, modifiable risk factors, importance of smoking cessation, and her general CVA progress.   Pt returned upstairs to her room with directional cues needed to locate. SBA to CGA for gait with the RW in the hospital, distances continued to be > 328ft. Pt concluded session with her needs met, seat belt alarm on, and 1/2 lap applied to L arm to help improve awareness/support/safety.     Therapy Documentation Precautions:   Precautions Precautions: Fall Recall of Precautions/Restrictions: Intact Precaution/Restrictions Comments: L hemipareisis and inattention Restrictions Weight Bearing Restrictions Per Provider Order: No General:      Therapy/Group: Individual Therapy  Sherlean SHAUNNA Perks 05/27/2024, 7:19 AM

## 2024-05-27 NOTE — Progress Notes (Signed)
 PROGRESS NOTE   Subjective/Complaints:  No events overnight.  No acute complaints. Vitals stable.   Blood sugars relatively well-controlled.  Patient endorses good sleep, log appropriate.  ROS: Patient denies CP, SOB, N/V/D  Objective:   No results found.  No results for input(s): WBC, HGB, HCT, PLT in the last 72 hours.  No results for input(s): NA, K, CL, CO2, GLUCOSE, BUN, CREATININE, CALCIUM  in the last 72 hours.   Intake/Output Summary (Last 24 hours) at 05/27/2024 1549 Last data filed at 05/27/2024 9277 Gross per 24 hour  Intake 712 ml  Output --  Net 712 ml        Physical Exam: Vital Signs Blood pressure 135/70, pulse 91, temperature (!) 97.5 F (36.4 C), resp. rate 18, height 5' 4 (1.626 m), weight 79.8 kg, SpO2 98%.    General: No acute distress.  Sitting up in bed. Mood and affect are appropriate Heart: Regular rate and rhythm no rubs murmurs or extra sounds Lungs: Clear to auscultation, breathing unlabored, no rales or wheezes Abdomen: Positive bowel sounds, soft nontender to palpation, nondistended Extremities: No clubbing, cyanosis, or edema Skin: + Left forehead Steri-Strips intact, healing + Left orbital ecchymosis resolving + Left elbow skin tear is healing, open area covered in nonadhesive gauze  Neurologic: Cranial nerves II through XII intact, motor strength is 5/5 in right and 4/5 left deltoid, bicep, tricep, grip, hip flexor, knee extensors, ankle dorsiflexor and plantar flexor Fine motor intact BUE Musculoskeletal: Reduced left shoulder flexion and abduction but no pain--unchanged  Assessment/Plan: 1. Functional deficits which require 3+ hours per day of interdisciplinary therapy in a comprehensive inpatient rehab setting. Physiatrist is providing close team supervision and 24 hour management of active medical problems listed below. Physiatrist and rehab  team continue to assess barriers to discharge/monitor patient progress toward functional and medical goals  Care Tool:  Bathing    Body parts bathed by patient: Right arm, Left arm, Chest, Abdomen, Front perineal area, Buttocks, Right upper leg, Left upper leg, Face, Right lower leg, Left lower leg   Body parts bathed by helper: Right lower leg, Left lower leg     Bathing assist Assist Level: Contact Guard/Touching assist     Upper Body Dressing/Undressing Upper body dressing   What is the patient wearing?: Pull over shirt    Upper body assist Assist Level: Contact Guard/Touching assist    Lower Body Dressing/Undressing Lower body dressing      What is the patient wearing?: Pants, Underwear/pull up     Lower body assist Assist for lower body dressing: Contact Guard/Touching assist     Toileting Toileting    Toileting assist Assist for toileting: Contact Guard/Touching assist     Transfers Chair/bed transfer  Transfers assist     Chair/bed transfer assist level: Contact Guard/Touching assist     Locomotion Ambulation   Ambulation assist      Assist level: Contact Guard/Touching assist Assistive device: Walker-rolling Max distance: 135   Walk 10 feet activity   Assist     Assist level: Contact Guard/Touching assist Assistive device: Walker-rolling   Walk 50 feet activity   Assist    Assist level: Contact Guard/Touching  assist Assistive device: Walker-rolling    Walk 150 feet activity   Assist Walk 150 feet activity did not occur: Safety/medical concerns         Walk 10 feet on uneven surface  activity   Assist Walk 10 feet on uneven surfaces activity did not occur: Safety/medical concerns         Wheelchair     Assist Is the patient using a wheelchair?: Yes (will use during stay but is not expected to use after d/c) Type of Wheelchair: Manual    Wheelchair assist level: Dependent - Patient 0%      Wheelchair 50  feet with 2 turns activity    Assist        Assist Level: Dependent - Patient 0%   Wheelchair 150 feet activity     Assist      Assist Level: Dependent - Patient 0%   Blood pressure 135/70, pulse 91, temperature (!) 97.5 F (36.4 C), resp. rate 18, height 5' 4 (1.626 m), weight 79.8 kg, SpO2 98%.    Medical Problem List and Plan: 1. Functional deficits secondary to right MCA, ACA and MCA/PCA infarcts embolic pattern secondary to embolic source              -patient may  shower -ELOS/Goals: 05/30/24, supervision PT, OT --Continue CIR therapies including PT, OT  Wants to do OP therapy after d/c but discussed no driving due to L neglect    2.  Antithrombotics: -DVT/anticoagulation:  Pharmaceutical: Lovenox- may d/c amb 120'             -antiplatelet therapy: Plavix (Asprin allergy)   3. Pain Management: Hx chronic pain-Gabapentin  100 mg twice daily.  Tylenol  prn   4. Mood/Behavior/Sleep: LCSW to follow for evaluation and support when available.              -antipsychotic agents: Abilify  20 mg daily  Amitriptyline  100 mg nightly, for mood/sleep  - 10-25: Slept well per sleep log 5. Neuropsych/cognition: This patient IS capable of making decisions on HER own behalf.   6. Skin/Wound Care: Routine pressure relief measures.  - 10-26: Elbow healing, continue current measures   7. Fluids/Electrolytes/Nutrition: Monitor I&O, weights.  Labs in a.m.             -GERD: Pepcid     8. Acute multifocal focal infarct: Loop recorder to be placed by EP prior to CIR             - Continue Plavix and statin   9. COPD exacerbation:  Pulmicort and DuoNeb as scheduled. Confirmed with medicine *Prednisone 40 mg until 10/17 followed by 3 days of prednisone at 20 mg daily. Wheezing on left side noted on lung exam but not audible, pt states she does not feel this  Last oral prednisone 10/20, will resume taper at lower dose             - DuoNeb as needed   10.  Lung CA: Recently  diagnosed with lung cancer stage IA.  MRI brain no mets.  Has appt with [facility] 06/12/24 Dr Kerrin   11. AKI: resolved 10/17    Latest Ref Rng & Units 05/24/2024    5:42 AM 05/21/2024    7:12 AM 05/18/2024    6:07 AM  BMP  Glucose 70 - 99 mg/dL 887  857  896   BUN 8 - 23 mg/dL 20  25  26    Creatinine 0.44 - 1.00 mg/dL 8.96  9.00  0.83   Sodium 135 - 145 mmol/L 140  138  138   Potassium 3.5 - 5.1 mmol/L 4.3  3.7  3.9   Chloride 98 - 111 mmol/L 104  104  101   CO2 22 - 32 mmol/L 28  24  26    Calcium  8.9 - 10.3 mg/dL 8.8  8.4  8.7       12. Leukocytosis: reactive secondary to steroids use.  Afebrile  -UA neg 10/14    Latest Ref Rng & Units 05/24/2024    5:42 AM 05/21/2024    7:12 AM 05/18/2024    6:07 AM  CBC  WBC 4.0 - 10.5 K/uL 15.6  15.2  15.2   Hemoglobin 12.0 - 15.0 g/dL 87.2  86.9  86.0   Hematocrit 36.0 - 46.0 % 39.0  39.0  40.2   Platelets 150 - 400 K/uL 286  330  332    Repeat Monday expect a  normalization of WBCs off prednisone   13. HTN: Stable, no meds.  Long-term BP goal normotensive Vitals:   05/27/24 0615 05/27/24 1321  BP: 129/71 135/70  Pulse: 75 91  Resp: 18 18  Temp: 98.5 F (36.9 C) (!) 97.5 F (36.4 C)  SpO2: 98% 98%    BP fair - good control , no BP meds    14.  HLD: LDL 23 on Crestor  5 mg   15.  T2DM:  A1c 7.4.  Continue CBG monitoring AC/HS with SSI.              - NovoLog  SSI, add Semglee 15 units daily, may need to adjust if am CBGs remain elevated   CBG (last 3)  Recent Labs    05/26/24 2116 05/27/24 0805 05/27/24 1140  GLUCAP 109* 210* 110*    D/c semglee (not home med) , will be off prednisone after tomorrow am , start metformin  XR 500mg  BID 10/22 (home dose 1000mg  BID may adjust if needed)  10-25/26: Blood sugars under fair control  16.  Tobacco abuse: Smoking cessation also provided.   -Nicotine patch      LOS: 10 days A FACE TO FACE EVALUATION WAS PERFORMED  [name] [name] 05/27/2024, 3:49 PM

## 2024-05-27 NOTE — Plan of Care (Signed)
  Problem: Consults Goal: RH STROKE PATIENT EDUCATION Description: See Patient Education module for education specifics  Outcome: Progressing   Problem: RH BOWEL ELIMINATION Goal: RH STG MANAGE BOWEL WITH ASSISTANCE Description: STG Manage Bowel with mod I Assistance. Outcome: Progressing Goal: RH STG MANAGE BOWEL W/MEDICATION W/ASSISTANCE Description: STG Manage Bowel with Medication with  mod I Assistance. Outcome: Progressing   Problem: RH SAFETY Goal: RH STG ADHERE TO SAFETY PRECAUTIONS W/ASSISTANCE/DEVICE Description: STG Adhere to Safety Precautions With cues  Assistance/Device. Outcome: Progressing   Problem: RH PAIN MANAGEMENT Goal: RH STG PAIN MANAGED AT OR BELOW PT'S PAIN GOAL Description: PAin < 4 with prns Outcome: Progressing   Problem: RH KNOWLEDGE DEFICIT Goal: RH STG INCREASE KNOWLEDGE OF DIABETES Description: Patient and S.O. will be able to manage DM using educational resources for medications and dietary modification independnetly Outcome: Progressing Goal: RH STG INCREASE KNOWLEDGE OF HYPERTENSION Description: Patient and S.O. will be able to manage HTN using educational resources for medications and dietary modification independnetly Outcome: Progressing Goal: RH STG INCREASE KNOWLEGDE OF HYPERLIPIDEMIA Description: Patient and S.O. will be able to manage HLD using educational resources for medications and dietary modification independnetly Outcome: Progressing Goal: RH STG INCREASE KNOWLEDGE OF STROKE PROPHYLAXIS Description: Patient and S.O. will be able to manage secondary risks using educational resources for medications and dietary modification independnetly Outcome: Progressing

## 2024-05-28 ENCOUNTER — Other Ambulatory Visit (HOSPITAL_COMMUNITY): Payer: Self-pay

## 2024-05-28 DIAGNOSIS — D72829 Elevated white blood cell count, unspecified: Secondary | ICD-10-CM | POA: Diagnosis not present

## 2024-05-28 DIAGNOSIS — C3490 Malignant neoplasm of unspecified part of unspecified bronchus or lung: Secondary | ICD-10-CM | POA: Diagnosis not present

## 2024-05-28 DIAGNOSIS — I63411 Cerebral infarction due to embolism of right middle cerebral artery: Secondary | ICD-10-CM | POA: Diagnosis not present

## 2024-05-28 DIAGNOSIS — J449 Chronic obstructive pulmonary disease, unspecified: Secondary | ICD-10-CM | POA: Diagnosis not present

## 2024-05-28 LAB — BASIC METABOLIC PANEL WITH GFR
Anion gap: 11 (ref 5–15)
BUN: 13 mg/dL (ref 8–23)
CO2: 24 mmol/L (ref 22–32)
Calcium: 8.5 mg/dL — ABNORMAL LOW (ref 8.9–10.3)
Chloride: 105 mmol/L (ref 98–111)
Creatinine, Ser: 0.79 mg/dL (ref 0.44–1.00)
GFR, Estimated: >60 mL/min (ref >60)
Glucose, Bld: 132 mg/dL — ABNORMAL HIGH (ref 70–99)
Potassium: 3.7 mmol/L (ref 3.5–5.1)
Sodium: 140 mmol/L (ref 135–145)

## 2024-05-28 LAB — CBC
HCT: 36.4 % (ref 36.0–46.0)
Hemoglobin: 11.9 g/dL — ABNORMAL LOW (ref 12.0–15.0)
MCH: 31.7 pg (ref 26.0–34.0)
MCHC: 32.7 g/dL (ref 30.0–36.0)
MCV: 97.1 fL (ref 80.0–100.0)
Platelets: 212 K/uL (ref 150–400)
RBC: 3.75 MIL/uL — ABNORMAL LOW (ref 3.87–5.11)
RDW: 12.9 % (ref 11.5–15.5)
WBC: 8.7 K/uL (ref 4.0–10.5)
nRBC: 0 % (ref 0.0–0.2)

## 2024-05-28 LAB — GLUCOSE, CAPILLARY
Glucose-Capillary: 134 mg/dL — ABNORMAL HIGH (ref 70–99)
Glucose-Capillary: 123 mg/dL — ABNORMAL HIGH (ref 70–99)
Glucose-Capillary: 135 mg/dL — ABNORMAL HIGH (ref 70–99)
Glucose-Capillary: 130 mg/dL — ABNORMAL HIGH (ref 70–99)

## 2024-05-28 MED ORDER — BUDESONIDE 0.25 MG/2ML IN SUSP
0.2500 mg | Freq: Two times a day (BID) | RESPIRATORY_TRACT | 0 refills | Status: DC
Start: 2024-05-28 — End: 2024-05-30
  Filled 2024-05-28: qty 120, 30d supply, fill #0

## 2024-05-28 MED ORDER — METFORMIN HCL ER 500 MG PO TB24
1000.0000 mg | ORAL_TABLET | Freq: Two times a day (BID) | ORAL | Status: DC
  Administered 2024-05-28 – 2024-06-01 (×9): 1000 mg via ORAL
  Filled 2024-05-28 (×9): qty 2

## 2024-05-28 MED ORDER — IPRATROPIUM-ALBUTEROL 0.5-2.5 (3) MG/3ML IN SOLN
3.0000 mL | Freq: Two times a day (BID) | RESPIRATORY_TRACT | 0 refills | Status: DC
  Filled 2024-05-28 – 2024-05-31 (×3): qty 360, 60d supply, fill #0

## 2024-05-28 NOTE — Progress Notes (Signed)
 Physical Therapy Session Note  Patient Details  Name: [name] MRN: [id number] Date of Birth: May 08, 1951  Today's Date: 05/28/2024 PT Individual Time: 0802-0900 PT Individual Time Calculation (min): 58 min   Short Term Goals: Week 1:  PT Short Term Goal 1 (Week 1): Pt will demonstrate bed mobility with consistent SBA to bed setup as per home environment. PT Short Term Goal 1 - Progress (Week 1): Met PT Short Term Goal 2 (Week 1): Pt will demonstrate sti<>stands with increased control and consistent supervision. PT Short Term Goal 2 - Progress (Week 1): Met PT Short Term Goal 3 (Week 1): Pt will demonstrate stand pivot transfers with consistent CGA. PT Short Term Goal 3 - Progress (Week 1): Met PT Short Term Goal 4 (Week 1): Pt will ambulate more than 166ft consistently with overall CGA. PT Short Term Goal 4 - Progress (Week 1): Met PT Short Term Goal 5 (Week 1): Pt will complete flight of steps with CGA. PT Short Term Goal 5 - Progress (Week 1): Progressing toward goal Week 2:  PT Short Term Goal 1 (Week 2): STG = LTG d/t ELOS  Skilled Therapeutic Interventions/Progress Updates:  Patient seated upright on EOB on entrance to room. Patient alert and agreeable to PT session.   Patient with no pain complaint at start of session.  Therapeutic Activity: Bed Mobility: Pt performed supine <> sit with ***. VC/ tc required for ***. Transfers: Pt performed sit<>stand and stand pivot transfers throughout session with ***. Provided vc/ tc for***.  Gait Training:  Pt ambulated *** ft using *** with ***. Demonstrated ***. Provided vc/ tc for ***.  Wheelchair Mobility:  Pt propelled wheelchair *** feet with ***. Provided vc/ tc for ***.  Neuromuscular Re-ed: NMR facilitated during session with focus on ***. Pt guided in ***. NMR performed for improvements in motor control and coordination, balance, sequencing, judgement, and self confidence/ efficacy in performing all aspects of mobility  at highest level of independence.   Therapeutic Exercise: Pt performed the following exercises with vc/ tc for proper technique. ***  Patient *** at end of session with brakes locked, *** alarm set, and all needs within reach.  -dressing with supervision vc for safety and sequencing - sequencing for sit<>stand but continues to reach out for RW and continues to have reduced awareness of L hemibody/ reduced proprioception - discussion re: need to relate sequencing to partner at home so that partner can call out missed steps in process.  - printed floor transfer steps  Therapy Documentation Precautions:  Precautions Precautions: Fall Recall of Precautions/Restrictions: Intact Precaution/Restrictions Comments: L hemipareisis and inattention Restrictions Weight Bearing Restrictions Per Provider Order: No General:   Vital Signs: Therapy Vitals Temp: 98.2 F (36.8 C) Pulse Rate: 77 Resp: 18 BP: 131/72 Oxygen  Therapy SpO2: 95 % O2 Device: Room Air Pain:   Mobility:   Locomotion :    Trunk/Postural Assessment :    Balance:   Exercises:   Other Treatments:      Therapy/Group: {Therapy/Group:[id number]}  Mliss DELENA Milliner 05/28/2024, 8:19 AM

## 2024-05-28 NOTE — Progress Notes (Signed)
 PROGRESS NOTE   Subjective/Complaints:  Labs reviewed   ROS: Patient denies CP, SOB, N/V/D  Objective:   No results found.  Recent Labs    05/28/24 0520  WBC 8.7  HGB 11.9*  HCT 36.4  PLT 212    Recent Labs    05/28/24 0520  NA 140  K 3.7  CL 105  CO2 24  GLUCOSE 132*  BUN 13  CREATININE 0.79  CALCIUM  8.5*     Intake/Output Summary (Last 24 hours) at 05/28/2024 0731 Last data filed at 05/27/2024 1725 Gross per 24 hour  Intake 436 ml  Output --  Net 436 ml        Physical Exam: Vital Signs Blood pressure 131/72, pulse 77, temperature 98.2 F (36.8 C), resp. rate 18, height 5' 4 (1.626 m), weight 79.8 kg, SpO2 95%.    General: No acute distress.  Sitting up in bed. Mood and affect are appropriate Heart: Regular rate and rhythm no rubs murmurs or extra sounds Lungs: Clear to auscultation, breathing unlabored, no rales or wheezes Abdomen: Positive bowel sounds, soft nontender to palpation, nondistended Extremities: No clubbing, cyanosis, or edema Skin: + Left forehead Steri-Strips intact, healing + Left orbital ecchymosis resolving + Left elbow skin tear is healing, open area covered in nonadhesive gauze  Neurologic: Cranial nerves II through XII intact, motor strength is 5/5 in right and 4/5 left deltoid, bicep, tricep, grip, hip flexor, knee extensors, ankle dorsiflexor and plantar flexor Fine motor intact BUE Musculoskeletal: Reduced left shoulder flexion and abduction but no pain--unchanged  Assessment/Plan: 1. Functional deficits which require 3+ hours per day of interdisciplinary therapy in a comprehensive inpatient rehab setting. Physiatrist is providing close team supervision and 24 hour management of active medical problems listed below. Physiatrist and rehab team continue to assess barriers to discharge/monitor patient progress toward functional and medical goals  Care  Tool:  Bathing    Body parts bathed by patient: Right arm, Left arm, Chest, Abdomen, Front perineal area, Buttocks, Right upper leg, Left upper leg, Face, Right lower leg, Left lower leg   Body parts bathed by helper: Right lower leg, Left lower leg     Bathing assist Assist Level: Contact Guard/Touching assist     Upper Body Dressing/Undressing Upper body dressing   What is the patient wearing?: Pull over shirt    Upper body assist Assist Level: Contact Guard/Touching assist    Lower Body Dressing/Undressing Lower body dressing      What is the patient wearing?: Pants, Underwear/pull up     Lower body assist Assist for lower body dressing: Contact Guard/Touching assist     Toileting Toileting    Toileting assist Assist for toileting: Contact Guard/Touching assist     Transfers Chair/bed transfer  Transfers assist     Chair/bed transfer assist level: Contact Guard/Touching assist     Locomotion Ambulation   Ambulation assist      Assist level: Contact Guard/Touching assist Assistive device: Walker-rolling Max distance: 135   Walk 10 feet activity   Assist     Assist level: Contact Guard/Touching assist Assistive device: Walker-rolling   Walk 50 feet activity   Assist  Assist level: Contact Guard/Touching assist Assistive device: Walker-rolling    Walk 150 feet activity   Assist Walk 150 feet activity did not occur: Safety/medical concerns         Walk 10 feet on uneven surface  activity   Assist Walk 10 feet on uneven surfaces activity did not occur: Safety/medical concerns         Wheelchair     Assist Is the patient using a wheelchair?: Yes (will use during stay but is not expected to use after d/c) Type of Wheelchair: Manual    Wheelchair assist level: Dependent - Patient 0%      Wheelchair 50 feet with 2 turns activity    Assist        Assist Level: Dependent - Patient 0%   Wheelchair 150 feet  activity     Assist      Assist Level: Dependent - Patient 0%   Blood pressure 131/72, pulse 77, temperature 98.2 F (36.8 C), resp. rate 18, height 5' 4 (1.626 m), weight 79.8 kg, SpO2 95%.    Medical Problem List and Plan: 1. Functional deficits secondary to right MCA, ACA and MCA/PCA infarcts embolic pattern secondary to embolic source              -patient may  shower -ELOS/Goals: 05/30/24, supervision PT, OT --Continue CIR therapies including PT, OT  Wants to do OP therapy after d/c but discussed no driving due to L neglect    2.  Antithrombotics: -DVT/anticoagulation:  Pharmaceutical: Lovenox- may d/c amb 120'             -antiplatelet therapy: Plavix (Asprin allergy)   3. Pain Management: Hx chronic pain-Gabapentin  100 mg twice daily.  Tylenol  prn   4. Mood/Behavior/Sleep: LCSW to follow for evaluation and support when available.              -antipsychotic agents: Abilify  20 mg daily  Amitriptyline  100 mg nightly, for mood/sleep  - 10-25: Slept well per sleep log 5. Neuropsych/cognition: This patient IS capable of making decisions on HER own behalf.   6. Skin/Wound Care: Routine pressure relief measures.  - 10-26: Elbow healing, continue current measures   7. Fluids/Electrolytes/Nutrition: Monitor I&O, weights.  Labs in a.m.             -GERD: Pepcid     8. Acute multifocal focal infarct: Loop recorder to be placed by EP prior to CIR             - Continue Plavix and statin   9. COPD exacerbation:  Pulmicort and DuoNeb as scheduled. Confirmed with medicine *Prednisone 40 mg until 10/17 followed by 3 days of prednisone at 20 mg daily. Wheezing on left side noted on lung exam but not audible, pt states she does not feel this  Last oral prednisone 10/20, will resume taper at lower dose             - DuoNeb as needed   10.  Lung CA: Recently diagnosed with lung cancer stage IA.  MRI brain no mets.  Has appt with [facility] 06/12/24 Dr Kerrin   11. AKI: resolved  10/17    Latest Ref Rng & Units 05/28/2024    5:20 AM 05/24/2024    5:42 AM 05/21/2024    7:12 AM  BMP  Glucose 70 - 99 mg/dL 867  887  857   BUN 8 - 23 mg/dL 13  20  25    Creatinine 0.44 - 1.00 mg/dL 9.20  1.03  0.99   Sodium 135 - 145 mmol/L 140  140  138   Potassium 3.5 - 5.1 mmol/L 3.7  4.3  3.7   Chloride 98 - 111 mmol/L 105  104  104   CO2 22 - 32 mmol/L 24  28  24    Calcium  8.9 - 10.3 mg/dL 8.5  8.8  8.4       12. Leukocytosis: reactive secondary to steroids use.  Afebrile  -UA neg 10/14    Latest Ref Rng & Units 05/28/2024    5:20 AM 05/24/2024    5:42 AM 05/21/2024    7:12 AM  CBC  WBC 4.0 - 10.5 K/uL 8.7  15.6  15.2   Hemoglobin 12.0 - 15.0 g/dL 88.0  87.2  86.9   Hematocrit 36.0 - 46.0 % 36.4  39.0  39.0   Platelets 150 - 400 K/uL 212  286  330    Repeat Monday expect a  normalization of WBCs off prednisone   13. HTN: Stable, no meds.  Long-term BP goal normotensive Vitals:   05/27/24 2146 05/28/24 0519  BP:  131/72  Pulse:  77  Resp:  18  Temp:  98.2 F (36.8 C)  SpO2: 97% 95%    BP fair - good control , no BP meds    14.  HLD: LDL 23 on Crestor  5 mg   15.  T2DM:  A1c 7.4.  Continue CBG monitoring AC/HS with SSI.              - NovoLog  SSI,only using ~3U per day   CBG (last 3)  Recent Labs    05/27/24 1613 05/27/24 2041 05/28/24 0552  GLUCAP 114* 155* 134*    D/c semglee (not home med) , will be off prednisone after tomorrow am , start metformin  XR 500mg  BID 10/22 (home dose 1000mg  BID may adjust if needed)  10/27- CBG control is fair, still on novalog with meals will d/c and increase metformin  to home dose   16.  Tobacco abuse: Smoking cessation also provided.   -Nicotine patch      LOS: 11 days A FACE TO FACE EVALUATION WAS PERFORMED  Prentice FORBES Compton 05/28/2024, 7:31 AM

## 2024-05-28 NOTE — Plan of Care (Signed)
  Problem: Consults Goal: RH STROKE PATIENT EDUCATION Description: See Patient Education module for education specifics  Outcome: Progressing   Problem: RH BOWEL ELIMINATION Goal: RH STG MANAGE BOWEL WITH ASSISTANCE Description: STG Manage Bowel with mod I Assistance. Outcome: Progressing Goal: RH STG MANAGE BOWEL W/MEDICATION W/ASSISTANCE Description: STG Manage Bowel with Medication with  mod I Assistance. Outcome: Progressing   Problem: RH SAFETY Goal: RH STG ADHERE TO SAFETY PRECAUTIONS W/ASSISTANCE/DEVICE Description: STG Adhere to Safety Precautions With cues  Assistance/Device. Outcome: Progressing   Problem: RH PAIN MANAGEMENT Goal: RH STG PAIN MANAGED AT OR BELOW PT'S PAIN GOAL Description: PAin < 4 with prns Outcome: Progressing   Problem: RH KNOWLEDGE DEFICIT Goal: RH STG INCREASE KNOWLEDGE OF DIABETES Description: Patient and S.O. will be able to manage DM using educational resources for medications and dietary modification independnetly Outcome: Progressing Goal: RH STG INCREASE KNOWLEDGE OF HYPERTENSION Description: Patient and S.O. will be able to manage HTN using educational resources for medications and dietary modification independnetly Outcome: Progressing Goal: RH STG INCREASE KNOWLEGDE OF HYPERLIPIDEMIA Description: Patient and S.O. will be able to manage HLD using educational resources for medications and dietary modification independnetly Outcome: Progressing Goal: RH STG INCREASE KNOWLEDGE OF STROKE PROPHYLAXIS Description: Patient and S.O. will be able to manage secondary risks using educational resources for medications and dietary modification independnetly Outcome: Progressing

## 2024-05-28 NOTE — Progress Notes (Signed)
 Occupational Therapy Weekly Progress Note  Patient Details  Name: [name] MRN: [id number] Date of Birth: May 06, 1951  Beginning of progress report period: May 18, 2024 End of progress report period: May 28, 2024  Session 1: Today's Date: 05/28/2024 OT Individual Time: 9054-8894 OT Individual Time Calculation (min): 80 min   Session 2:  Today's Date: 05/28/2024 OT Individual Time: 1352-1500 OT Individual Time Calculation (min): 68 min   Patient has met 4 of 4 short term goals. Ms. Botkin is steadily progressing towards reaching her LTGs. She is completing UB dressing SUP, LB dressing CGA to MIN A, toileting CGA, bathing CGA, and ambulatory toilet and shower transfers with CGA using RW. She requires verbal cues for safety in regards to L U/LE positioning during functional transfers and ADLs, however is demonstrating improved safety awareness and improved carryover over the past week. She is planning to d/c home with her partner, Stephane, and receive intermittent assistance from her neighbor and sister-in-law. Pt's family is planning  to attend family education on 10/28.   Patient continues to demonstrate the following deficits: muscle weakness, decreased cardiorespiratoy endurance, impaired timing and sequencing, unbalanced muscle activation, decreased coordination, and decreased motor planning, decreased visual perceptual skills, decreased visual motor skills, and field cut, decreased attention to left, decreased attention, decreased awareness, decreased problem solving, and decreased safety awareness, central origin, and decreased sitting balance, decreased standing balance, and decreased balance strategies and therefore will continue to benefit from skilled OT intervention to enhance overall performance with BADL, iADL, and Reduce care partner burden.  Patient progressing toward long term goals..  Continue plan of care.  OT Short Term Goals Week 1:  OT Short Term Goal 1 (Week  1): Pt will complete UB dressing while sitting using hemi dressing techniques with MIN A. OT Short Term Goal 1 - Progress (Week 1): Met OT Short Term Goal 2 (Week 1): Pt will complete LB dressing while sitting using hemi dressing techniques with MIN A. OT Short Term Goal 2 - Progress (Week 1): Met OT Short Term Goal 3 (Week 1): Pt will maintain [name] in safe position when completing functional transfers with MIN verbal cues. OT Short Term Goal 3 - Progress (Week 1): Met OT Short Term Goal 4 (Week 1): Pt will complete toilet transfer utilizing LRAD with SUP with no more than MOD verbal cues. OT Short Term Goal 4 - Progress (Week 1): Met Week 2:  OT Short Term Goal 1 (Week 2): STG=LTG d/t ELOS  Skilled Therapeutic Interventions/Progress Updates:     Session 1: Pt received sitting up in wc presenting to be in good spirits receptive to skilled OT session reporting 0/10 pain- OT offering intermittent rest breaks, repositioning, and therapeutic support to optimize participation in therapy session. Pt requesting to take shower. Focused this session on ADL retraining and functional mobility training with emphasis on safety awareness and attention to L hemibody positioning. She completed ambulatory tranfers using RW this session with SBA to CGA wc > elevated toilet seat > TTB > wc, however she requires MOD verbal cues for attention to L LE positioning in RW and safety 2/2 L visual field cut, decreased sensation, and poor L hemibody proprioception. 3/3 toileting tasks completed with CGA provided during clothing management, peri-care completed SUP while seated. Majority of U/LB bathing completed in seated position for safety with intermittent CGA provided for dynamic sitting balance to ensure safety. She is able to utilize L UE at a diminished level with SUP during bathing. Stood  using grab bar with CGA while washing buttocks. Following shower, U/LB dressing completed seated on TTB donning [place]  shirt with SUP with MIN  verbal cues to recall hemi-dressing techniques. Able to weave feet into pants, however required CGA for sitting balance with decreased insight into deficits noted and MOD verbal cues for orientation of pants. Stood to RW to bring pants to waist with SBA, however when pulling pants to waist Pt with posterior LOB requiring MIN A to correct. Donned socks/shoes with MOD A to donn L sock and tie shoe laces. Dried hair in seated position utilizing B UE to brush hair while managing hair drier. Spent time at end of session reassessing Pt's vision d/t Pt reporting difficulty seeing posterior foot of RW on L side. Potential L lower quadrant visual field cut noted. 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 [name], seatbelt alarm on, and all needs met.    Session 2:  Pt received siting up in wc, dressed and ready for the day presenting to be in good spirits receptive to skilled OT session reporting 3/10 pain in bilateral knees- OT offering intermittent rest breaks, repositioning, and therapeutic support to optimize participation in therapy session. Transported Pt total A to therapy gym in wc for time management.   Stand pivot wc > NuStep using RW CGA. Engaged Pt in completing endurance training on NuStep to improve overall activity tolerance for ADLs and improve B U/LE reciprocal movement patterns for improved independence in functional transfers. Pt able to complete 2 intervals of 5 min on level 1 setting with rest break provided between trials.   Engaged Pt in completing functional mobility visual scanning activity to work on [place]m [place], safety awareness, and attention to L hemibody positioning during ambulation with Pt task with locating cones spread around therapy gym in R/L visual field both high and low while navigating using RW. She completed two  trials with CGA overall and MIN questioning cues required to locate cones and MOD verbal cues for L foot positioning- intermittent dragging of L foot and Pt intermittently kicking back of RW d/t decreased proprioception.   Pt completed series of activities on BIT system to work on visual scanning, L side attention, and functional reaching with L UE. Pt completed visual scanning complex array x2 trials utilizing L UE to touch letters of alphabet in order and visual scanning single target/user paced using L UE to touch moving targets. Pt completed activities in standing position using RW for increased dual tasking challenge. Pt noted to have compensatory shoulder elevation during functional reaching with MAX verbal/tactile cues required to correct- decreased body awareness noted. Pt also noted to fatigue after ~1.5-2 min of standing, however Pt requires MAX A to initiating taking a rest break demonstrating poor insight into her deficits.   Throughout session, continued to discuss and educate on importance of having 24/7 SUP at d/c, impact of CVA on her safety awareness, and importance of not returning to driving until she has been cleared to do so from MD. 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 Pt demonstrating poor insight into her deficits and impact of CVA overall despite education provided.  [name], chair alarm on, and all needs met.    Therapy Documentation Precautions:  Precautions Precautions: Fall Recall of Precautions/Restrictions: Intact Precaution/Restrictions Comments: L hemipareisis and inattention Restrictions Weight Bearing Restrictions Per Provider Order: No   Therapy/Group: Individual Therapy  [name] 05/28/2024,  10:14 AM

## 2024-05-29 DIAGNOSIS — J449 Chronic obstructive pulmonary disease, unspecified: Secondary | ICD-10-CM | POA: Diagnosis not present

## 2024-05-29 DIAGNOSIS — I63411 Cerebral infarction due to embolism of right middle cerebral artery: Secondary | ICD-10-CM | POA: Diagnosis not present

## 2024-05-29 DIAGNOSIS — C3490 Malignant neoplasm of unspecified part of unspecified bronchus or lung: Secondary | ICD-10-CM | POA: Diagnosis not present

## 2024-05-29 DIAGNOSIS — D72829 Elevated white blood cell count, unspecified: Secondary | ICD-10-CM | POA: Diagnosis not present

## 2024-05-29 LAB — GLUCOSE, CAPILLARY
Glucose-Capillary: 132 mg/dL — ABNORMAL HIGH (ref 70–99)
Glucose-Capillary: 109 mg/dL — ABNORMAL HIGH (ref 70–99)
Glucose-Capillary: 121 mg/dL — ABNORMAL HIGH (ref 70–99)
Glucose-Capillary: 141 mg/dL — ABNORMAL HIGH (ref 70–99)

## 2024-05-29 NOTE — Plan of Care (Signed)
  Problem: Consults Goal: RH STROKE PATIENT EDUCATION Description: See Patient Education module for education specifics  Outcome: Progressing   Problem: RH BOWEL ELIMINATION Goal: RH STG MANAGE BOWEL WITH ASSISTANCE Description: STG Manage Bowel with mod I Assistance. Outcome: Progressing Goal: RH STG MANAGE BOWEL W/MEDICATION W/ASSISTANCE Description: STG Manage Bowel with Medication with  mod I Assistance. Outcome: Progressing   Problem: RH SAFETY Goal: RH STG ADHERE TO SAFETY PRECAUTIONS W/ASSISTANCE/DEVICE Description: STG Adhere to Safety Precautions With cues  Assistance/Device. Outcome: Progressing   Problem: RH PAIN MANAGEMENT Goal: RH STG PAIN MANAGED AT OR BELOW PT'S PAIN GOAL Description: PAin < 4 with prns Outcome: Progressing   Problem: RH KNOWLEDGE DEFICIT Goal: RH STG INCREASE KNOWLEDGE OF DIABETES Description: Patient and S.O. will be able to manage DM using educational resources for medications and dietary modification independnetly Outcome: Progressing Goal: RH STG INCREASE KNOWLEDGE OF HYPERTENSION Description: Patient and S.O. will be able to manage HTN using educational resources for medications and dietary modification independnetly Outcome: Progressing Goal: RH STG INCREASE KNOWLEGDE OF HYPERLIPIDEMIA Description: Patient and S.O. will be able to manage HLD using educational resources for medications and dietary modification independnetly Outcome: Progressing Goal: RH STG INCREASE KNOWLEDGE OF STROKE PROPHYLAXIS Description: Patient and S.O. will be able to manage secondary risks using educational resources for medications and dietary modification independnetly Outcome: Progressing

## 2024-05-29 NOTE — Progress Notes (Addendum)
 Patient ID: [name], female   DOB: April 04, 1951, 74 y.o.   MRN: [id number]  Therapy team felt pt needed a few more days due to the person who was going to assist is going out [place] the [place] and her partner can only provide supervision due to her own health issues and use of walker in the home. Have extended discharge to Friday 10/31, pt is agreeable to this and partner sill coming today for education  1:42 PM Pt scheduled for PT at 1:00 but family ed set up for 2-4 today. Hopefully will see OT session.

## 2024-05-29 NOTE — Progress Notes (Signed)
 Physical Therapy Session Note  Patient Details  Name: [name] MRN: [id number] Date of Birth: 08-25-1950  Today's Date: 05/29/2024 PT Individual Time: 1301-1400 PT Individual Time Calculation (min): 59 min   Short Term Goals: Week 1:  PT Short Term Goal 1 (Week 1): Pt will demonstrate bed mobility with consistent SBA to bed setup as per home environment. PT Short Term Goal 1 - Progress (Week 1): Met PT Short Term Goal 2 (Week 1): Pt will demonstrate sti<>stands with increased control and consistent supervision. PT Short Term Goal 2 - Progress (Week 1): Met PT Short Term Goal 3 (Week 1): Pt will demonstrate stand pivot transfers with consistent CGA. PT Short Term Goal 3 - Progress (Week 1): Met PT Short Term Goal 4 (Week 1): Pt will ambulate more than 135ft consistently with overall CGA. PT Short Term Goal 4 - Progress (Week 1): Met PT Short Term Goal 5 (Week 1): Pt will complete flight of steps with CGA. PT Short Term Goal 5 - Progress (Week 1): Progressing toward goal Week 2:  PT Short Term Goal 1 (Week 2): STG = LTG d/t ELOS  Skilled Therapeutic Interventions/Progress Updates:  Patient seated upright in w/c on entrance to room. Patient alert and agreeable to PT session.   Patient with no pain complaint at start of session. Nursing students present and taking vital signs.   Therapeutic Activity: Overall, pt demos improved body awareness from yesterday's session. Is able to complete sit<>stands with improved RW positioning throughout session with only one instance of catching L foot to outside of RW. During stair navigation, pt demos one moment of confusion as to what to do with RW on approach to stairs. With time and minimal vc, pt realizes need to move RW to side in order to safely navigate steps. Attempts to move RW but unable d/t reduced awareness of L hand position on railing instead of walker.   - Manages four 6 steps x3 with overall CGA improving to SBA to ascend and  supervision to descend. Vc for foot clearance on descent with [name] and to lead with RLE to ascend although pt able to reciprocate step pattern to ascend. Does require vc for whole foot placement to step with [name] d/t reduced awareness of L hemibody.  Next performs step onto 5 curb platform with RW and SBA. Good return demo with reduced cues and pt verbalizing steps as she completes.  Guided in ramp navigation using RW as well as ambulation in mulch pit and need to adjust RW progression as though walking through yard. Completes all with SBA.   Neuromuscular Re-ed: NMR facilitated during session with focus on standing balance, safety awareness, dual tasking. Guided in transfer training and pt demos improved awareness of positioning body vs RW as well as sequencing of technique requiring continued education re: need for forward scoot to initiate sit<>stand. With blocked practice is able to demo good balance with no hand use and overall supervision.   Next guided pt in sit<>stand and standing balance with use of tidal tank with instructions to maintain level hold Continues to demo [name] weakness and need for vc for attn to [name] and need for correction.  Also used tidal tank in BUE to shift COM forward during sit<>stand transfers without use of BUE to push from seat. Then able to progress to sit<>stand with no UE.   Car transfer performed again with supervision as pt requires vc for full turn to sit first to seat rather than to  side step into car.  On return to room, pt's S.O. and neighbor present for family education. Discussed pt's continued reduced awareness to L side and need for supervision and vc for any corrections to improve safety awareness and reduce risk for falls. Able to take few minutes in order for pt to return demo very short distance ambulation with and without RW as well as stair navigation of 4 steps with overall supervision. S.O. very pleased with performance as she has not yet seen pt  ambulate. Discussed need for focus with HH therapies to work with pt on navigating home as well as stairwell so that pt can reach room and bathroom to shower.   NMR performed for improvements in motor control and coordination, balance, sequencing, judgement, and self confidence/ efficacy in performing all aspects of mobility at highest level of independence.   Patient seated upright in w/c at end of session with brakes locked, S.O. and neighbor present in and in care of OT/ OT student for continued family education.   Therapy Documentation Precautions:  Precautions Precautions: Fall Recall of Precautions/Restrictions: Intact Precaution/Restrictions Comments: L hemipareisis and inattention Restrictions Weight Bearing Restrictions Per Provider Order: No  Pain:  No pain related this session.   Therapy/Group: Individual Therapy  Kayla Liles SPT Mliss DELENA Milliner PT, DPT, CSRS 05/29/2024, 12:47 PM

## 2024-05-29 NOTE — Progress Notes (Signed)
 Occupational Therapy Session Note  Patient Details  Name: [name] MRN: [id number] Date of Birth: Mar 22, 1951  Today's Date: 05/29/2024 OT Individual Time: 1400-1504 OT Individual Time Calculation (min): 64 min    Short Term Goals: Week 2:  OT Short Term Goal 1 (Week 2): STG=LTG d/t ELOS  Skilled Therapeutic Interventions/Progress Updates:     Pt received finishing up PT family education session. Pt presenting to be in good spirits receptive to skilled OT session reporting 0/10 pain- OT offering intermittent rest breaks, repositioning, and therapeutic support to optimize participation in therapy session. Pt's partner, Stephane, and neighbor, Deatrice, present in room upon OT arrival for family education focused therapy session. Provided education on CVA etiology/recovery process, fall prevention, energy conservation techniques, and simple home modifications to increase Pt's safety. Education provided on utilizing gait belt 24/7 at d/c. Recommending Pt have 25/7 supervision at d/c and follow-up HHOT services with Pt and family/caregiver in agreement with plan. No DME needs at this time as Pt owns a shower seat and her shower/toilet are equip with grab bars. Recommending use of bed rail for bed mobility. Educated Pt's family/caregivers on impact of CVA on her functional status with focus on decreased sensation, poor proprioception, L visual field cut, decreased L side attention, and decreased safety awareness. Educated on AE such as sock aid and safety considerations when completing ADLs with use of hemi-techniques. Recommending Pt not cook at this time and receive assistance from caregivers for complex meal preparation. Pt is safe to complete simple meal prep with SUP. Engaged Pt's partner/caregiver in observing transfers with education provided on technique for bed, toilet, and shower transfers with emphasis on simple cues to provide and body mechanics to prevent injury. Pt's family members demonstrating  appropriate insight into Pt's deficits and demonstrating teach back as evidence of learning. Pt and Pt's family members reporting all questions were answered at end of session. Pt feels prepared to d/c on 10/31. [name], chair alarm on, and all needs met.    Therapy Documentation Precautions:  Precautions Precautions: Fall Recall of Precautions/Restrictions: Intact Precaution/Restrictions Comments: L hemipareisis and inattention Restrictions Weight Bearing Restrictions Per Provider Order: No   Therapy/Group: Individual Therapy  [name] 05/29/2024, 3:17 PM

## 2024-05-29 NOTE — Progress Notes (Signed)
 PROGRESS NOTE   Subjective/Complaints:  Labs reviewed , discussed caregiver post d/c who remains her partner with MS, who drives and uses a walker to ambulate  ROS: Patient denies CP, SOB, N/V/D  Objective:   No results found.  Recent Labs    05/28/24 0520  WBC 8.7  HGB 11.9*  HCT 36.4  PLT 212    Recent Labs    05/28/24 0520  NA 140  K 3.7  CL 105  CO2 24  GLUCOSE 132*  BUN 13  CREATININE 0.79  CALCIUM  8.5*     Intake/Output Summary (Last 24 hours) at 05/29/2024 0743 Last data filed at 05/28/2024 1828 Gross per 24 hour  Intake 476 ml  Output --  Net 476 ml        Physical Exam: Vital Signs Blood pressure 132/64, pulse 72, temperature 97.9 F (36.6 C), resp. rate 18, height 5' 4 (1.626 m), weight 79.8 kg, SpO2 96%.    General: No acute distress.  Sitting up in bed. Mood and affect are appropriate Heart: Regular rate and rhythm no rubs murmurs or extra sounds Lungs: Clear to auscultation, breathing unlabored, no rales or wheezes Abdomen: Positive bowel sounds, soft nontender to palpation, nondistended Extremities: No clubbing, cyanosis, or edema Skin: + Left forehead Steri-Strips intact, healing + Left orbital ecchymosis resolving + Left elbow skin tear is healing, open area covered in nonadhesive gauze  Neurologic: Cranial nerves II through XII intact, motor strength is 5/5 in right and 4/5 left deltoid, bicep, tricep, grip, hip flexor, knee extensors, ankle dorsiflexor and plantar flexor Fine motor intact BUE Visual fields intact to confrontation testing  Musculoskeletal: Reduced left shoulder flexion and abduction but no pain--unchanged  Assessment/Plan: 1. Functional deficits which require 3+ hours per day of interdisciplinary therapy in a comprehensive inpatient rehab setting. Physiatrist is providing close team supervision and 24 hour management of active medical problems listed  below. Physiatrist and rehab team continue to assess barriers to discharge/monitor patient progress toward functional and medical goals  Care Tool:  Bathing    Body parts bathed by patient: Right arm, Left arm, Chest, Abdomen, Front perineal area, Buttocks, Right upper leg, Left upper leg, Face, Right lower leg, Left lower leg   Body parts bathed by helper: Right lower leg, Left lower leg     Bathing assist Assist Level: Contact Guard/Touching assist     Upper Body Dressing/Undressing Upper body dressing   What is the patient wearing?: Pull over shirt    Upper body assist Assist Level: Contact Guard/Touching assist    Lower Body Dressing/Undressing Lower body dressing      What is the patient wearing?: Pants, Underwear/pull up     Lower body assist Assist for lower body dressing: Contact Guard/Touching assist     Toileting Toileting    Toileting assist Assist for toileting: Contact Guard/Touching assist     Transfers Chair/bed transfer  Transfers assist     Chair/bed transfer assist level: Contact Guard/Touching assist     Locomotion Ambulation   Ambulation assist      Assist level: Contact Guard/Touching assist Assistive device: Walker-rolling Max distance: 135   Walk 10 feet activity  Assist     Assist level: Contact Guard/Touching assist Assistive device: Walker-rolling   Walk 50 feet activity   Assist    Assist level: Contact Guard/Touching assist Assistive device: Walker-rolling    Walk 150 feet activity   Assist Walk 150 feet activity did not occur: Safety/medical concerns         Walk 10 feet on uneven surface  activity   Assist Walk 10 feet on uneven surfaces activity did not occur: Safety/medical concerns         Wheelchair     Assist Is the patient using a wheelchair?: Yes (will use during stay but is not expected to use after d/c) Type of Wheelchair: Manual    Wheelchair assist level: Dependent -  Patient 0%      Wheelchair 50 feet with 2 turns activity    Assist        Assist Level: Dependent - Patient 0%   Wheelchair 150 feet activity     Assist      Assist Level: Dependent - Patient 0%   Blood pressure 132/64, pulse 72, temperature 97.9 F (36.6 C), resp. rate 18, height 5' 4 (1.626 m), weight 79.8 kg, SpO2 96%.    Medical Problem List and Plan: 1. Functional deficits secondary to right MCA, ACA and MCA/PCA infarcts embolic pattern secondary to embolic source              -patient may  shower -ELOS/Goals: 05/30/24, supervision PT, OT --Continue CIR therapies including PT, OT  Wants to do OP therapy after d/c but discussed no driving due to L neglect    2.  Antithrombotics: -DVT/anticoagulation:  Pharmaceutical: Lovenox- may d/c amb 120'             -antiplatelet therapy: Plavix (Asprin allergy)   3. Pain Management: Hx chronic pain-Gabapentin  100 mg twice daily.  Tylenol  prn   4. Mood/Behavior/Sleep: LCSW to follow for evaluation and support when available.              -antipsychotic agents: Abilify  20 mg daily  Amitriptyline  100 mg nightly, for mood/sleep  - 10-25: Slept well per sleep log 5. Neuropsych/cognition: This patient IS capable of making decisions on HER own behalf.   6. Skin/Wound Care: Routine pressure relief measures.  - 10-26: Elbow healing, continue current measures   7. Fluids/Electrolytes/Nutrition: Monitor I&O, weights.  Labs in a.m.             -GERD: Pepcid     8. Acute multifocal focal infarct: Loop recorder to be placed by EP prior to CIR             - Continue Plavix and statin   9. COPD exacerbation:  Pulmicort and DuoNeb as scheduled. Confirmed with medicine *Prednisone 40 mg until 10/17 followed by 3 days of prednisone at 20 mg daily. Wheezing on left side noted on lung exam but not audible, pt states she does not feel this  Last oral prednisone 10/20, will resume taper at lower dose             - DuoNeb as needed    10.  Lung CA: Recently diagnosed with lung cancer stage IA.  MRI brain no mets.  Has appt with [facility] 06/12/24 Dr Kerrin   11. AKI: resolved 10/17    Latest Ref Rng & Units 05/28/2024    5:20 AM 05/24/2024    5:42 AM 05/21/2024    7:12 AM  BMP  Glucose 70 - 99 mg/dL  132  112  142   BUN 8 - 23 mg/dL 13  20  25    Creatinine 0.44 - 1.00 mg/dL 9.20  8.96  9.00   Sodium 135 - 145 mmol/L 140  140  138   Potassium 3.5 - 5.1 mmol/L 3.7  4.3  3.7   Chloride 98 - 111 mmol/L 105  104  104   CO2 22 - 32 mmol/L 24  28  24    Calcium  8.9 - 10.3 mg/dL 8.5  8.8  8.4       12. Leukocytosis: reactive secondary to steroids use.  Afebrile  -UA neg 10/14    Latest Ref Rng & Units 05/28/2024    5:20 AM 05/24/2024    5:42 AM 05/21/2024    7:12 AM  CBC  WBC 4.0 - 10.5 K/uL 8.7  15.6  15.2   Hemoglobin 12.0 - 15.0 g/dL 88.0  87.2  86.9   Hematocrit 36.0 - 46.0 % 36.4  39.0  39.0   Platelets 150 - 400 K/uL 212  286  330    Resolved off prednisone    13. HTN: Stable, no meds.  Long-term BP goal normotensive Vitals:   05/28/24 2122 05/29/24 0524  BP:  132/64  Pulse: 75 72  Resp: 18 18  Temp:  97.9 F (36.6 C)  SpO2: 96% 96%    BP fair - good control , no BP meds    14.  HLD: LDL 23 on Crestor  5 mg   15.  T2DM:  A1c 7.4.  Continue CBG monitoring AC/HS with SSI.              - NovoLog  SSI,only using ~3U per day   CBG (last 3)  Recent Labs    05/28/24 1638 05/28/24 2129 05/29/24 0602  GLUCAP 135* 130* 132*    Controlled on metformin  XR 1000mg  BID, d/c SSI  16.  Tobacco abuse: Smoking cessation also provided.   -Nicotine patch      LOS: 12 days A FACE TO FACE EVALUATION WAS PERFORMED  [name] 05/29/2024, 7:43 AM

## 2024-05-29 NOTE — Progress Notes (Signed)
 Occupational Therapy Session Note  Patient Details  Name: [name] MRN: [id number] Date of Birth: [birth year]  Today's Date: 05/29/2024 OT Individual Time: 9182-9084 OT Individual Time Calculation (min): 58 min    Short Term Goals: Week 2:  OT Short Term Goal 1 (Week 2): STG=LTG d/t ELOS  Skilled Therapeutic Interventions/Progress Updates:     Pt received sitting EOB, dressed and ready for the day upon OT arrival presenting to be in good spirits receptive to skilled OT session reporting 0/10 pain- OT offering intermittent rest breaks, repositioning, and therapeutic support to optimize participation in therapy session. Focused this session on reassessment of Pt's functional status, Pt education, L side awareness, and RW management skills. Pt requesting to brush teeth at beginning of session. Sit > stand to RW CGA. Functional mobility to wc using RW CGA +verbal cues for L foot positioning and proximity to RW. Pt able to propel w/c to sink and set-up for ADLs with SUP. Pt completed oral care and washed face in seated position with set-up A- able to manipulate toiletry items without difficulty this session. Engaged Pt in completing w/c propulsion to therapy gym to work on bimanual UE functional use, L side attention, and L side awareness. Pt able to complete ~122ft with SUP, however she required MAX verbal cues for L hand positioning to maintain it on rim vs wheel of chair to avoid her fingers getting caught and to avoid running into wall and scraping arm on L side of hallway.   While seated at table, engaged Pt in completing 9-hole peg test (9HPT), Box and Block (B&B), and grip strength.  -9 Hole Peg Test completed for [facility], speed, and dexterity. During initial assessment, Pt scored R- 25.67 sec and L-56.24 sec. During today's assessment, Pt scored R-25.51 sec and L-41.29 sec. Improvement noted in L UE FMC and no significant change noted in R UE functional use.  -Box and Block assessment  completed to measure gross manual dexterity and unilateral upper extremity function. During initial assessment, Pt scored R-48 and L-42. During today's assessment, Pt scored R-58 and L-42. Improvement noted in R gross motor dexterity, however no change noted in L UE within B&B assessment.  -Grip: During initial assessment, Pt scored 603 bilaterally. During today's assessment, Pt scored 52.5# on R UE and 55# on L UE. No significant changes noted bilaterally.   Targeted L side attention/awareness, activity tolerance, and RW management skills within obstacle course with Pt challenges with navigating between cones and making turns to simulate utilizing RW in tight spaces within home setting. Pt completed two trials with second trial being graded up by having cones placed close together and turning towards impaired, L side. Pt complete first trial with SBA-CGA with MIN verbal cues for L foot placement, however during second trial Pt with multiple missteps present- kicking back leg of RW on L side and not fully stepping L LE through requiring MIN A to correct balance. Discussed Pt's performance following to support learning and awareness of deficits following. Pt receptive to education, however continues to demonstrate poor awareness of how her deficits impact her day to day life.   [name], seatbelt alarm on, and all needs met.    Therapy Documentation Precautions:  Precautions Precautions: Fall Recall of Precautions/Restrictions: Intact Precaution/Restrictions Comments: L hemipareisis and inattention Restrictions Weight Bearing Restrictions Per Provider Order: No  Therapy/Group: Individual Therapy  [name] 05/29/2024, 8:44 AM

## 2024-05-29 NOTE — Progress Notes (Signed)
 Occupational Therapy Session Note  Patient Details  Name: [name] MRN: [id number] Date of Birth: Apr 01, 1951  Today's Date: 05/29/2024 OT Individual Time: 0930-1000 OT Individual Time Calculation (min): 30 min    Short Term Goals: Week 2:  OT Short Term Goal 1 (Week 2): STG=LTG d/t ELOS  Skilled Therapeutic Interventions/Progress Updates:      Therapy Documentation Precautions:  Precautions Precautions: Fall Recall of Precautions/Restrictions: Intact Precaution/Restrictions Comments: L hemipareisis and inattention Restrictions Weight Bearing Restrictions Per Provider Order: No General: Pt seated in W/C upon OT arrival, agreeable to OT.  Pain: no pain reported   Other Treatments: OT providing therapeutic use of self in order to build rapport and discuss patient current situation and goals for therapy. OT discussing home set up with pt and DME. OT educating pt on fall prevention at home d/t frequent falls PTA. 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 OT discussing options for eventual return to work. OT also educating pt on public transportation d/t limited transportation options.    Pt seated in W/C at end of session with W/C alarm donned, call light within reach and 4Ps assessed.    Therapy/Group: Individual Therapy  Camie Hoe, OTD, OTR/L 05/29/2024, 12:41 PM

## 2024-05-30 ENCOUNTER — Other Ambulatory Visit (HOSPITAL_COMMUNITY): Payer: Self-pay

## 2024-05-30 DIAGNOSIS — J449 Chronic obstructive pulmonary disease, unspecified: Secondary | ICD-10-CM | POA: Diagnosis not present

## 2024-05-30 DIAGNOSIS — D72829 Elevated white blood cell count, unspecified: Secondary | ICD-10-CM | POA: Diagnosis not present

## 2024-05-30 DIAGNOSIS — C3490 Malignant neoplasm of unspecified part of unspecified bronchus or lung: Secondary | ICD-10-CM | POA: Diagnosis not present

## 2024-05-30 DIAGNOSIS — I63411 Cerebral infarction due to embolism of right middle cerebral artery: Secondary | ICD-10-CM | POA: Diagnosis not present

## 2024-05-30 LAB — GLUCOSE, CAPILLARY
Glucose-Capillary: 138 mg/dL — ABNORMAL HIGH (ref 70–99)
Glucose-Capillary: 111 mg/dL — ABNORMAL HIGH (ref 70–99)
Glucose-Capillary: 122 mg/dL — ABNORMAL HIGH (ref 70–99)
Glucose-Capillary: 129 mg/dL — ABNORMAL HIGH (ref 70–99)

## 2024-05-30 MED ORDER — ARNUITY ELLIPTA 100 MCG/ACT IN AEPB
1.0000 | INHALATION_SPRAY | Freq: Every day | RESPIRATORY_TRACT | 0 refills | Status: DC
  Filled 2024-05-30: qty 30, 30d supply, fill #0

## 2024-05-30 NOTE — Patient Care Conference (Signed)
 Inpatient RehabilitationTeam Conference and Plan of Care Update Date: 05/30/2024   Time: 10:38 AM    Patient Name: [name]      Medical Record Number: [id number]  Date of Birth: January 28, 1951 Sex: Female         Room/Bed: 4M01C/4M01C-01 Payor Info: Payor: [employer] MEDICARE / Plan: BCBS MEDICARE / Product Type: *No Product type* /    Admit Date/Time:  05/17/2024  5:32 PM  Primary Diagnosis:  CVA (cerebral vascular accident) [facility])  Hospital Problems: Principal Problem:   CVA (cerebral vascular accident) (HCC) Active Problems:   Essential hypertension   Tobacco abuse   Pulmonary nodule 1 cm or greater in diameter   Gastroesophageal reflux disease   Drug-induced myopathy   Benign essential hypertension   Major depressive disorder, recurrent, moderate (HCC)   Malignant neoplasm of lung (HCC)   Thyroid  nodule   Panic disorder   Migraine without aura and responsive to treatment   Stage 2 chronic kidney disease   Poorly controlled type 2 diabetes mellitus (HCC)   Acute hypoxemic respiratory failure (HCC)   Acute left-sided weakness   AKI (acute kidney injury)   Hypokalemia   Acute CVA (cerebrovascular accident) [facility])   Depression with anxiety    Expected Discharge Date: Expected Discharge Date: 06/01/24  Team Members Present: Physician leading conference: Dr. Prentice Compton Social Worker Present: Rhoda Clement, LCSW Nurse Present: Barnie Ronde, RN PT Present: Recardo Milliner, PT OT Present: Katheryn Mines, OT SLP Present: Blaise Alderman, SLP PPS Coordinator present : Eleanor Colon, SLP     Current Status/Progress Goal Weekly Team Focus  Bowel/Bladder   Currently continent B/B  LBM 10/28   Will remain continent with normal pattern   Assist with toilet needs qshift/prn    Swallow/Nutrition/ Hydration               ADL's   UB ADLs SUP, LB ADLs CGA-MIN A, toilet/shower transfers close SBA-CGA (occasionally MIN A if distracted d/t poor L hemibody  awareness), fmaily ed completed 10/28 // Barriers: decreased family support, poor proprioception, decreased insight into defiicts, L inattention, poor insight, poor sensation on L   MOD I-SUP   family/patient education, L hemibody NMR, ADL retraining, activity tolerance, vision assessment, body awareness    Mobility   Bed mobility = Mod I, STS = supervision with min vc, SPVT = SBA/ CGA and RW,  Ambulation = SBA using RW, Stair navigation =12 steps with B HR's and SBA   Supervision  Barriers = reduced and variable awareness of L side /// Work on: safety awareness, improving insight into deficits and limitations, L sided attention, standing balance and dynamic gait, beginning family eduation and training    Communication                Safety/Cognition/ Behavioral Observations               Pain   Denies pain at this time   Will be free from pain   Assess pain qshift/prn    Skin   Impaired skin intergrity   Will be fre from infection and maintain skin intergrity  Assess skin for s/s of infection, promote healing and provide education to prevent breakdown      Discharge Planning:  Partner here yesterday for family training and can provide supervision. Pt at high risk to fall at home due to L-inattention and body awareness. DME in place and [facility] referral made   Team Discussion: Patient admitted post  CVA with COPD flare and new lung ca diagnosis. Progress limited by poor insight, poor proprioception and left inattention, easily distracted with poor safety awareness.   Patient on target to meet rehab goals: yes, currently needs close supervision for lower body care. Needs supervision for stand pivot transfers and ambulating using a RW.  On target to meet goals for discharge.   *See Care Plan and progress notes for long and short-term goals.   Revisions to Treatment Plan:  Change nebulizer treatment to inhaler for discharge   Teaching Needs: Safety, medications, skin care,  transfers, toileting, etc.   Current Barriers to Discharge: Decreased caregiver support and Home enviroment access/layout  Possible Resolutions to Barriers: Family education OP follow up services DME: RW     Medical Summary Current Status: lung ca, COPD, left neglect , fall risk, CBG control improved  Barriers to Discharge: Pending chemo/radiation   Possible Resolutions to Barriers/Weekly Focus: caregiver and pt ed , medication adjustment , estended stay due to caregiver limitations   Continued Need for Acute Rehabilitation Level of Care: The patient requires daily medical management by a physician with specialized training in physical medicine and rehabilitation for the following reasons: Direction of a multidisciplinary physical rehabilitation program to maximize functional independence : Yes Medical management of patient stability for increased activity during participation in an intensive rehabilitation regime.: Yes Analysis of laboratory values and/or radiology reports with any subsequent need for medication adjustment and/or medical intervention. : Yes   I attest that I was present, lead the team conference, and concur with the assessment and plan of the team.   Fredericka Sober B 05/30/2024, 12:46 PM

## 2024-05-30 NOTE — Progress Notes (Signed)
 Occupational Therapy Note  Patient Details  Name: LUVADA SALAMONE MRN: [id number] Date of Birth: August 22, 1950  Occupational Therapist participated in the interdisciplinary team conference, providing clinical information regarding the patient's current status, treatment goals, and weekly focus, including any barriers that need to be addressed. Please see the Inpatient Rehabilitation Team Conference and Plan of Care Update for further details.     Katheryn SQUIBB Woodson 05/30/2024, 10:43 AM

## 2024-05-30 NOTE — Progress Notes (Signed)
 Physical Therapy Note  Patient Details  Name: [name] MRN: [id number] Date of Birth: 03/05/1951 Today's Date: 05/30/2024    Physical Therapist participated in the interdisciplinary team conference, providing clinical information regarding the patient's current status, treatment goals, and weekly focus, including any barriers that need to be addressed. Please see the Inpatient Rehabilitation Team Conference and Plan of Care Update for further details.     Mliss DELENA Milliner PT, DPT, CSRS 05/30/2024, 12:40 PM

## 2024-05-30 NOTE — Progress Notes (Addendum)
 Physical Therapy Session Note  Patient Details  Name: [name] MRN: [id number] Date of Birth: Mar 12, 1951  Today's Date: 05/30/2024 PT Individual Time: 0830-0900 PT Individual Time Calculation (min): 30 min   Short Term Goals: Week 2:  PT Short Term Goal 1 (Week 2): STG = LTG d/t ELOS  Skilled Therapeutic Interventions/Progress Updates: Pt presented sitting EOB agreeable to therapy. Pt denies pain during session. Session focused on education in preparation for d/c. Discussed HHPT vs OPPT with pt discussing concerns about delays in transport although would prefer OPPT. Spoke with Rhoda, LSW and will provide list of private pay transport. PTA also discussed other methods such as Uber/Lyft with pt appreciative. Pt left seated EOB at end of session with call bell within reach and needs met.      Therapy Documentation Precautions:  Precautions Precautions: Fall Recall of Precautions/Restrictions: Intact Precaution/Restrictions Comments: L hemipareisis and inattention Restrictions Weight Bearing Restrictions Per Provider Order: No   Therapy/Group: Individual Therapy  Eoghan Belcher 05/30/2024, 10:01 AM

## 2024-05-30 NOTE — Progress Notes (Signed)
 Physical Therapy Session Note  Patient Details  Name: [name] MRN: [id number] Date of Birth: 09/20/1950  Today's Date: 05/30/2024 PT Individual Time: 1450-1530 PT Individual Time Calculation (min): 40 min   Short Term Goals: Week 2:  PT Short Term Goal 1 (Week 2): STG = LTG d/t ELOS  Skilled Therapeutic Interventions/Progress Updates:    Pt presents in room handoff from prior PT session. Pt does not report pain during session, pt agreeable to PT. Session focused on NMR for dynamic standing balance as well as therapeutic activities to facilitate DC planning. Pt completes transfers with supervision with RW throughout session. Pt completes up/down 12 steps with BHRs CGA/close supervision, completes step to gait pattern as well as reciprocal gait pattern safely, completed to facilitate DC planning with pt reporting that she will stay on first floor at DC but her bedroom is upstairs. Pt picks up an object from floor with supervision no device. Pt completes gait with RW with supervision 2x50', then pt completes gait without device 100' with CGA demonstrating decreased stance time on [name], slightly increased L lateral lean however no LOB. Pt completes NMR with agility ladder for dynamic functional balance and BLE coordinatoin including: - forward gait (one foot in each square) - side stepping x2 trials bilaterally Pt provided with seated rest breaks between all gait trials and exercises to promote energy conservation and quality with tasks due to pt reporting fatigue from previous sessions.Pt returns to room and remains seated in [facility] with all needs within reach, cal light in place and chair alarm donned and activated at end of session.    Therapy Documentation Precautions:  Precautions Precautions: Fall Recall of Precautions/Restrictions: Intact Precaution/Restrictions Comments: L hemipareisis and inattention Restrictions Weight Bearing Restrictions Per Provider Order: No   Therapy/Group:  Individual Therapy  Reche Ohara PT, DPT 05/30/2024, 3:38 PM

## 2024-05-30 NOTE — Progress Notes (Signed)
 Physical Therapy Session Note  Patient Details  Name: [name] MRN: [id number] Date of Birth: [birth year]  Today's Date: 05/30/2024 PT Individual Time: 8592-8551 PT Individual Time Calculation (min): 41 min   Short Term Goals: Week 1:  PT Short Term Goal 1 (Week 1): Pt will demonstrate bed mobility with consistent SBA to bed setup as per home environment. PT Short Term Goal 1 - Progress (Week 1): Met PT Short Term Goal 2 (Week 1): Pt will demonstrate sti<>stands with increased control and consistent supervision. PT Short Term Goal 2 - Progress (Week 1): Met PT Short Term Goal 3 (Week 1): Pt will demonstrate stand pivot transfers with consistent CGA. PT Short Term Goal 3 - Progress (Week 1): Met PT Short Term Goal 4 (Week 1): Pt will ambulate more than 124ft consistently with overall CGA. PT Short Term Goal 4 - Progress (Week 1): Met PT Short Term Goal 5 (Week 1): Pt will complete flight of steps with CGA. PT Short Term Goal 5 - Progress (Week 1): Progressing toward goal Week 2:  PT Short Term Goal 1 (Week 2): STG = LTG d/t ELOS  Skilled Therapeutic Interventions/Progress Updates:  Patient seated upright in w/c on entrance to room. Patient alert and agreeable to PT session.   Patient with no pain complaint at start of session.  Discussed planning for next morning and NT assistance to pack and transport her things to car. Will also stop by pharmacy. She is ready to leave once counseled by nursing and NP, Daphne Satterfield.   Therapeutic Activity: Bed Mobility: Pt performed supine <> sit with IND. No cueing required. Transfers: Pt performed sit<>stand with independence and stand pivot transfers throughout session with supervision for walker positioning/ [name] positioning.  Gait Training:  Pt ambulated throughout department using RW and supervision. Intermittent catch of toe during ambulation of and pt is able to self correct with no stumble.   6 Min Walk Test:  Instructed  patient to ambulate as quickly and as safely as possible for 6 minutes using LRAD. Patient was allowed to take standing rest breaks without stopping the test, but if the patient required a sitting rest break the clock would be stopped and the test would be over.  Results: 707 feet (215.5 meters, Avg speed 0.39m/s) using a RW with supervision. Results indicate that the patient  is a limited community ambulator compared to age matched norms.  Age Matched Norms: 63-69 yo M: 85 F: 73, 43-79 yo M: 48 F: 471, 19-89 yo M: 417 F: 392 MDC: 58.21 meters (190.98 feet) or 50 meters (ANPTA Core Set of Outcome Measures for Adults with Neurologic Conditions, 2018)   Neuromuscular Re-ed: NMR facilitated during session with focus on standing balance and L awareness. Pt guided in sequencing of placing RW in front of her with both feet inside RW BOS, then sequencing of setup  for rise to stand to reduce use of Bil leg extension for assist as well as with safe lowering to seat. Improved technique this day with improved awareness of [name] positioning. Continued education that pt will require increased focus to L hemibody for all movements in order to ensure safe mobility. Reminded pt that once she is at home, she may feel more comfortable and reduce focus, but she will need to remind herself consistently. Partner has also been educated to remind her. NMR performed for improvements in motor control and coordination, balance, sequencing, judgement, and self confidence/ efficacy in performing all aspects of mobility at  highest level of independence.   Patient seated upright in w/c at end of session with brakes locked, belt alarm set, and all needs within reach. Handed off to PT for next session.    Therapy Documentation Precautions:  Precautions Precautions: Fall Recall of Precautions/Restrictions: Intact Precaution/Restrictions Comments: L hemipareisis and inattention Restrictions Weight Bearing Restrictions Per  Provider Order: No  Pain:  No pain related this session.   Therapy/Group: Individual Therapy  Mliss DELENA Milliner PT, DPT, CSRS 05/30/2024, 12:50 PM

## 2024-05-30 NOTE — Progress Notes (Signed)
 Occupational Therapy Session Note  Patient Details  Name: [name] MRN: [id number] Date of Birth: July 09, 1951  Today's Date: 05/30/2024 OT Individual Time: 1101-1200 OT Individual Time Calculation (min): 59 min    Short Term Goals: Week 2:  OT Short Term Goal 1 (Week 2): STG=LTG d/t ELOS  Skilled Therapeutic Interventions/Progress Updates:    Pt received resting in WC presenting to be in good spirits receptive to skilled OT session reporting 0/10 pain- OT offering intermittent rest breaks, repositioning, and therapeutic support to optimize participation in therapy session. Stand pivot WC>Nustep with CGA utilizing RW. Engaged Pt in Nustep on level 3 for 10 mins to increase blood flow, endurance, and strengthening. Stand pivot Nustep>WC with CGA utilizing RW. Transported Pt to gift shop to focus on RW management when navigating a crowded space and distracting environment with CGA for safety and MOD verbal cues for L side awareness. Graded up task by having Pt scan for specific items in the store while navigating utilizing RW with MIN questioning cues. Transported Pt back to room via WC. Doffed shoes with MAX A d/t time management. Provided Pt with elastic shoelaces to increase independence with donning/doffing shoes. Pt donned shoes with MIN A. [name], seat alarm on, and all needs met.    Therapy Documentation Precautions:  Precautions Precautions: Fall Recall of Precautions/Restrictions: Intact Precaution/Restrictions Comments: L hemipareisis and inattention Restrictions Weight Bearing Restrictions Per Provider Order: No   Therapy/Group: Individual Therapy  Paulina Fleeta Dixie 05/30/2024, 12:54 PM

## 2024-05-30 NOTE — Progress Notes (Signed)
 PROGRESS NOTE   Subjective/Complaints:  Was not using nebulizers at home   ROS: Patient denies CP, SOB, N/V/D  Objective:   No results found.  Recent Labs    05/28/24 0520  WBC 8.7  HGB 11.9*  HCT 36.4  PLT 212    Recent Labs    05/28/24 0520  NA 140  K 3.7  CL 105  CO2 24  GLUCOSE 132*  BUN 13  CREATININE 0.79  CALCIUM  8.5*     Intake/Output Summary (Last 24 hours) at 05/30/2024 0759 Last data filed at 05/29/2024 1855 Gross per 24 hour  Intake 440 ml  Output --  Net 440 ml        Physical Exam: Vital Signs Blood pressure (!) 145/75, pulse 83, temperature 98.2 F (36.8 C), resp. rate 19, height 5' 4 (1.626 m), weight 79.8 kg, SpO2 96%.    General: No acute distress.  Sitting up in bed. Mood and affect are appropriate Heart: Regular rate and rhythm no rubs murmurs or extra sounds Lungs: Clear to auscultation, breathing unlabored, no rales or wheezes Abdomen: Positive bowel sounds, soft nontender to palpation, nondistended Extremities: No clubbing, cyanosis, or edema Skin: + Left forehead Steri-Strips intact, healing + Left orbital ecchymosis resolving + Left elbow skin tear is healing, open area covered in nonadhesive gauze  Neurologic: Cranial nerves II through XII intact, motor strength is 5/5 in right and 4/5 left deltoid, bicep, tricep, grip, hip flexor, knee extensors, ankle dorsiflexor and plantar flexor Fine motor intact BUE Visual fields intact to confrontation testing  Musculoskeletal: Reduced left shoulder flexion and abduction but no pain--unchanged  Assessment/Plan: 1. Functional deficits which require 3+ hours per day of interdisciplinary therapy in a comprehensive inpatient rehab setting. Physiatrist is providing close team supervision and 24 hour management of active medical problems listed below. Physiatrist and rehab team continue to assess barriers to discharge/monitor  patient progress toward functional and medical goals  Care Tool:  Bathing    Body parts bathed by patient: Right arm, Left arm, Chest, Abdomen, Front perineal area, Buttocks, Right upper leg, Left upper leg, Face, Right lower leg, Left lower leg   Body parts bathed by helper: Right lower leg, Left lower leg     Bathing assist Assist Level: Contact Guard/Touching assist     Upper Body Dressing/Undressing Upper body dressing   What is the patient wearing?: Pull over shirt    Upper body assist Assist Level: Contact Guard/Touching assist    Lower Body Dressing/Undressing Lower body dressing      What is the patient wearing?: Pants, Underwear/pull up     Lower body assist Assist for lower body dressing: Contact Guard/Touching assist     Toileting Toileting    Toileting assist Assist for toileting: Contact Guard/Touching assist     Transfers Chair/bed transfer  Transfers assist     Chair/bed transfer assist level: Contact Guard/Touching assist     Locomotion Ambulation   Ambulation assist      Assist level: Contact Guard/Touching assist Assistive device: Walker-rolling Max distance: 135   Walk 10 feet activity   Assist     Assist level: Contact Guard/Touching assist Assistive device: Walker-rolling  Walk 50 feet activity   Assist    Assist level: Contact Guard/Touching assist Assistive device: Walker-rolling    Walk 150 feet activity   Assist Walk 150 feet activity did not occur: Safety/medical concerns         Walk 10 feet on uneven surface  activity   Assist Walk 10 feet on uneven surfaces activity did not occur: Safety/medical concerns         Wheelchair     Assist Is the patient using a wheelchair?: Yes (will use during stay but is not expected to use after d/c) Type of Wheelchair: Manual    Wheelchair assist level: Dependent - Patient 0%      Wheelchair 50 feet with 2 turns activity    Assist         Assist Level: Dependent - Patient 0%   Wheelchair 150 feet activity     Assist      Assist Level: Dependent - Patient 0%   Blood pressure (!) 145/75, pulse 83, temperature 98.2 F (36.8 C), resp. rate 19, height 5' 4 (1.626 m), weight 79.8 kg, SpO2 96%.    Medical Problem List and Plan: 1. Functional deficits secondary to right MCA, ACA and MCA/PCA infarcts embolic pattern secondary to embolic source              -patient may  shower -ELOS/Goals: 06/01/24, supervision PT, OT --Continue CIR therapies including PT, OT  Wants to do OP therapy after d/c but discussed no driving due to L neglect    2.  Antithrombotics: -DVT/anticoagulation:  Pharmaceutical: Lovenox- may d/c amb 120'             -antiplatelet therapy: Plavix (Asprin allergy)   3. Pain Management: Hx chronic pain-Gabapentin  100 mg twice daily.  Tylenol  prn   4. Mood/Behavior/Sleep: LCSW to follow for evaluation and support when available.              -antipsychotic agents: Abilify  20 mg daily  Amitriptyline  100 mg nightly, for mood/sleep  - 10-25: Slept well per sleep log 5. Neuropsych/cognition: This patient IS capable of making decisions on HER own behalf.   6. Skin/Wound Care: Routine pressure relief measures.  - 10-26: Elbow healing, continue current measures   7. Fluids/Electrolytes/Nutrition: Monitor I&O, weights.  Labs in a.m.             -GERD: Pepcid     8. Acute multifocal  infarct: Loop recorder placed by Dr Inocencio, EP prior to CIR- no documented Afib thus far              - Continue Plavix and statin   9. COPD exacerbation:  Pulmicort and DuoNeb as scheduled. Confirmed with medicine prednisone burst and taper completed              - DuoNeb as needed   10.  Lung CA: Recently diagnosed with lung cancer stage IA.  MRI brain no mets.  Has appt with [facility] 06/12/24 Dr Kerrin   11. AKI: resolved 10/17    Latest Ref Rng & Units 05/28/2024    5:20 AM 05/24/2024    5:42 AM 05/21/2024     7:12 AM  BMP  Glucose 70 - 99 mg/dL 867  887  857   BUN 8 - 23 mg/dL 13  20  25    Creatinine 0.44 - 1.00 mg/dL 9.20  8.96  9.00   Sodium 135 - 145 mmol/L 140  140  138   Potassium 3.5 -  5.1 mmol/L 3.7  4.3  3.7   Chloride 98 - 111 mmol/L 105  104  104   CO2 22 - 32 mmol/L 24  28  24    Calcium  8.9 - 10.3 mg/dL 8.5  8.8  8.4       12. Leukocytosis: reactive secondary to steroids use.  Afebrile      Latest Ref Rng & Units 05/28/2024    5:20 AM 05/24/2024    5:42 AM 05/21/2024    7:12 AM  CBC  WBC 4.0 - 10.5 K/uL 8.7  15.6  15.2   Hemoglobin 12.0 - 15.0 g/dL 88.0  87.2  86.9   Hematocrit 36.0 - 46.0 % 36.4  39.0  39.0   Platelets 150 - 400 K/uL 212  286  330    Resolved off prednisone    13. HTN: Stable, no meds.  Long-term BP goal normotensive Vitals:   05/29/24 2032 05/29/24 2102  BP: (!) 145/75   Pulse: 83   Resp: 19   Temp: 98.2 F (36.8 C)   SpO2: 96% 96%    BP fair - good control , no BP meds - check ortho vitals    14.  HLD: LDL 23 on Crestor  5 mg   15.  T2DM:  A1c 7.4.  Continue CBG monitoring AC/HS with SSI.              - NovoLog  SSI,only using ~3U per day   CBG (last 3)  Recent Labs    05/29/24 1637 05/29/24 2028 05/30/24 0612  GLUCAP 121* 141* 138*    Controlled on metformin  XR 1000mg  BID, d/c SSI  16.  Tobacco abuse: Smoking cessation also provided.   -Nicotine patch      LOS: 13 days A FACE TO FACE EVALUATION WAS PERFORMED  Prentice FORBES Compton 05/30/2024, 7:59 AM

## 2024-05-30 NOTE — Plan of Care (Signed)
  Problem: Consults Goal: RH STROKE PATIENT EDUCATION Description: See Patient Education module for education specifics  Outcome: Progressing   Problem: RH BOWEL ELIMINATION Goal: RH STG MANAGE BOWEL WITH ASSISTANCE Description: STG Manage Bowel with mod I Assistance. Outcome: Progressing Goal: RH STG MANAGE BOWEL W/MEDICATION W/ASSISTANCE Description: STG Manage Bowel with Medication with  mod I Assistance. Outcome: Progressing   Problem: RH SAFETY Goal: RH STG ADHERE TO SAFETY PRECAUTIONS W/ASSISTANCE/DEVICE Description: STG Adhere to Safety Precautions With cues  Assistance/Device. Outcome: Progressing   Problem: RH PAIN MANAGEMENT Goal: RH STG PAIN MANAGED AT OR BELOW PT'S PAIN GOAL Description: PAin < 4 with prns Outcome: Progressing   Problem: RH KNOWLEDGE DEFICIT Goal: RH STG INCREASE KNOWLEDGE OF DIABETES Description: Patient and S.O. will be able to manage DM using educational resources for medications and dietary modification independnetly Outcome: Progressing Goal: RH STG INCREASE KNOWLEDGE OF HYPERTENSION Description: Patient and S.O. will be able to manage HTN using educational resources for medications and dietary modification independnetly Outcome: Progressing Goal: RH STG INCREASE KNOWLEGDE OF HYPERLIPIDEMIA Description: Patient and S.O. will be able to manage HLD using educational resources for medications and dietary modification independnetly Outcome: Progressing Goal: RH STG INCREASE KNOWLEDGE OF STROKE PROPHYLAXIS Description: Patient and S.O. will be able to manage secondary risks using educational resources for medications and dietary modification independnetly Outcome: Progressing

## 2024-05-30 NOTE — Progress Notes (Signed)
 Patient ID: [name], female   DOB: [birth year], 73 y.o.   MRN: [id number]  Family training went well yesterday. Updated pt on team conference progress and reaching goals. No nebulizers needed at home according to Brandi-PA will be changed to inhalers at discharge. Equipment delivered. Home health in place ready for discharge Friday.

## 2024-05-31 ENCOUNTER — Other Ambulatory Visit (HOSPITAL_COMMUNITY): Payer: Self-pay

## 2024-05-31 DIAGNOSIS — J449 Chronic obstructive pulmonary disease, unspecified: Secondary | ICD-10-CM | POA: Diagnosis not present

## 2024-05-31 DIAGNOSIS — C3490 Malignant neoplasm of unspecified part of unspecified bronchus or lung: Secondary | ICD-10-CM | POA: Diagnosis not present

## 2024-05-31 DIAGNOSIS — I63411 Cerebral infarction due to embolism of right middle cerebral artery: Secondary | ICD-10-CM | POA: Diagnosis not present

## 2024-05-31 DIAGNOSIS — D72829 Elevated white blood cell count, unspecified: Secondary | ICD-10-CM | POA: Diagnosis not present

## 2024-05-31 LAB — GLUCOSE, CAPILLARY
Glucose-Capillary: 152 mg/dL — ABNORMAL HIGH (ref 70–99)
Glucose-Capillary: 102 mg/dL — ABNORMAL HIGH (ref 70–99)
Glucose-Capillary: 147 mg/dL — ABNORMAL HIGH (ref 70–99)
Glucose-Capillary: 119 mg/dL — ABNORMAL HIGH (ref 70–99)

## 2024-05-31 LAB — BASIC METABOLIC PANEL WITH GFR
Anion gap: 12 (ref 5–15)
BUN: 11 mg/dL (ref 8–23)
CO2: 24 mmol/L (ref 22–32)
Calcium: 8.7 mg/dL — ABNORMAL LOW (ref 8.9–10.3)
Chloride: 104 mmol/L (ref 98–111)
Creatinine, Ser: 0.69 mg/dL (ref 0.44–1.00)
GFR, Estimated: >60 mL/min (ref >60)
Glucose, Bld: 135 mg/dL — ABNORMAL HIGH (ref 70–99)
Potassium: 3.6 mmol/L (ref 3.5–5.1)
Sodium: 140 mmol/L (ref 135–145)

## 2024-05-31 LAB — CBC
HCT: 33.2 % — ABNORMAL LOW (ref 36.0–46.0)
Hemoglobin: 11.2 g/dL — ABNORMAL LOW (ref 12.0–15.0)
MCH: 32.3 pg (ref 26.0–34.0)
MCHC: 33.7 g/dL (ref 30.0–36.0)
MCV: 95.7 fL (ref 80.0–100.0)
Platelets: 174 K/uL (ref 150–400)
RBC: 3.47 MIL/uL — ABNORMAL LOW (ref 3.87–5.11)
RDW: 12.7 % (ref 11.5–15.5)
WBC: 8.3 K/uL (ref 4.0–10.5)
nRBC: 0 % (ref 0.0–0.2)

## 2024-05-31 NOTE — Progress Notes (Signed)
 Inpatient Rehabilitation Discharge Medication Review by a Pharmacist  A complete drug regimen review was completed for this patient to identify any potential clinically significant medication issues.  High Risk Drug Classes Is patient taking? Indication by Medication  Antipsychotic Yes Aripiprazole  - anxiety/depression  Anticoagulant No   Antibiotic No   Opioid No   Antiplatelet Yes Clopidogrel - CVA  Hypoglycemics/insulin  Yes Insulin  Aspart - DM Metformin  - DM  Vasoactive Medication No   Chemotherapy No   Other Yes Arnuity ellipta- COPD  Famotidine  - reflux Amitriptyline  - migraine prophylaxis  Calcium , Vit D - supplement Gabapentin  - neuropathic pain Crestor  - HLD/CVA Nicotine patch - tobacco cessation    Type of Medication Issue Identified Description of Issue Recommendation(s)  Drug Interaction(s) (clinically significant)     Duplicate Therapy     Allergy     No Medication Administration End Date     Incorrect Dose     Additional Drug Therapy Needed     Significant med changes from prior encounter (inform family/care partners about these prior to discharge). Valsartan/HCT PTA not restarted due to normal BP Restart outpatient as BP requires  Other       Clinically significant medication issues were identified that warrant physician communication and completion of prescribed/recommended actions by midnight of the next day:  No  Name of provider notified for urgent issues identified:   Provider Method of Notification:     Pharmacist comments:   Time spent performing this drug regimen review (minutes):  20  Massie Fila, PharmD Clinical Pharmacist  05/31/2024 10:17 AM

## 2024-05-31 NOTE — Progress Notes (Signed)
 Physical Therapy Session Note  Patient Details  Name: [name] MRN: [id number] Date of Birth: 22-Aug-1950  Today's Date: 05/31/2024 PT Individual Time: 0833-0930 PT Individual Time Calculation (min): 57 min   Short Term Goals: Week 1:  PT Short Term Goal 1 (Week 1): Pt will demonstrate bed mobility with consistent SBA to bed setup as per home environment. PT Short Term Goal 1 - Progress (Week 1): Met PT Short Term Goal 2 (Week 1): Pt will demonstrate sti<>stands with increased control and consistent supervision. PT Short Term Goal 2 - Progress (Week 1): Met PT Short Term Goal 3 (Week 1): Pt will demonstrate stand pivot transfers with consistent CGA. PT Short Term Goal 3 - Progress (Week 1): Met PT Short Term Goal 4 (Week 1): Pt will ambulate more than 175ft consistently with overall CGA. PT Short Term Goal 4 - Progress (Week 1): Met PT Short Term Goal 5 (Week 1): Pt will complete flight of steps with CGA. PT Short Term Goal 5 - Progress (Week 1): Progressing toward goal Week 2:  PT Short Term Goal 1 (Week 2): STG = LTG d/t ELOS  Skilled Therapeutic Interventions/Progress Updates:    Pt presents up in w/c. Focused on grad day activities to prepare for d/c tmrw. Pt denies concerns. Functional gait on unit with RW with S level to focus on mobility, safety with device, and overall strengthening/endurance > 150'. Performed Berg Balance Test to assess fall risk and balance deficits. See results below. Educated on results and fall risk. Verbalized understanding. Pt performed stair negotiation in stairwell to simulate better home access to second floor. Required overall CGA to min A and verbal cues for safety and which foot to lead with. Completed 12 steps in stair well with B handrails. Discussed and educated for options at home and recommendations to practice with HHPT first. Performed curb step negotiation with RW x 4 trials to simulate home entry as well with overall CGA for balance and  safety. Returned to room with all needs in reach.   Therapy Documentation Precautions:  Precautions Precautions: Fall Recall of Precautions/Restrictions: Intact Precaution/Restrictions Comments: L hemipareisis and reduced awareness of L hemibody Restrictions Weight Bearing Restrictions Per Provider Order: No  Pain: Pain Assessment Pain Scale: 0-10 Pain Score: 0-No pain  Balance: Balance Balance Assessed: Yes Berg Balance Test Sit to Stand: Able to stand  independently using hands Standing Unsupported: Able to stand 2 minutes with supervision Sitting with Back Unsupported but Feet Supported on Floor or Stool: Able to sit safely and securely 2 minutes Stand to Sit: Sits safely with minimal use of hands Transfers: Able to transfer safely, definite need of hands Standing Unsupported with Eyes Closed: Able to stand 10 seconds with supervision Standing Ubsupported with Feet Together: Needs help to attain position but able to stand for 30 seconds with feet together From Standing, Reach Forward with Outstretched Arm: Can reach forward >5 cm safely (2) From Standing Position, Pick up Object from Floor: Able to pick up shoe, needs supervision From Standing Position, Turn to Look Behind Over each Shoulder: Turn sideways only but maintains balance Turn 360 Degrees: Needs close supervision or verbal cueing Standing Unsupported, Alternately Place Feet on Step/Stool: Able to complete >2 steps/needs minimal assist Standing Unsupported, One Foot in Front: Needs help to step but can hold 15 seconds Standing on One Leg: Unable to try or needs assist to prevent fall Total Score: 31      Therapy/Group: Individual Therapy  [facility]  Donald WENDI Pereyra, PT, DPT, CBIS  05/31/2024, 11:58 AM

## 2024-05-31 NOTE — Progress Notes (Signed)
 Inpatient Rehabilitation Care Coordinator Discharge Note   Patient Details  Name: [name] MRN: [id number] Date of Birth: 1951/02/21   Discharge location: HOME WITH PARTNER WHO CAN PROVIDE SUPERVISION LEVEL  Length of Stay: 15 DAYS  Discharge activity level: SUPERVISION WITH CUES FOR SAFETY  Home/community participation: ACTIVE  Patient response un:Yzjouy Literacy - How often do you need to have someone help you when you read instructions, pamphlets, or other written material from your doctor or pharmacy?: Never  Patient response un:Dnrpjo Isolation - How often do you feel lonely or isolated from those around you?: Never  Services provided included: MD, RD, PT, OT, SLP, RN, CM, TR, Pharmacy, Neuropsych, SW  Financial Services:  Field Seismologist Utilized: [employer] MEDICARE  Choices offered to/list presented to: PT  Follow-up services arranged:  Home Health, DME, Patient/Family has no preference for HH/DME agencies Home Health Agency: [facility]  PT  OT    DME : [facility]  TRANSPORTATION RESOURCES GIVEN TO PT IN AVS  Patient response to transportation need: Is the patient able to respond to transportation needs?: Yes In the past 12 months, has lack of transportation kept you from medical appointments or from getting medications?: No In the past 12 months, has lack of transportation kept you from meetings, work, or from getting things needed for daily living?: No   Patient/Family verbalized understanding of follow-up arrangements:  Yes  Individual responsible for coordination of the follow-up plan: DAWN-PARTNER [phone]  Confirmed correct DME delivered: Raymonde Asberry MATSU 05/31/2024    Comments (or additional information):PARTNER CAME IN FOR FAMILY TRAINING AND IS AWARE OF THE CUES PT REQUIRES. PT IS HIGH RISK TO FALL AT HOME DUE TO HER LEFT SIDE NEGLECT  Summary of Stay    Date/Time Discharge Planning CSW  05/30/24  9160 Partner here yesterday for family training and can provide supervision. Pt at high risk to fall at home due to L-inattention and body awareness. DME in place and [facility] referral made RGD  05/23/24 0909 HOme with partner who has MS and needs to be careful. Pt is doing well in therapies. Await team's recommendations for follow up and DME RGD       Bakary Bramer G

## 2024-05-31 NOTE — Progress Notes (Signed)
 Physical Therapy Discharge Summary  Patient Details  Name: [name] MRN: [id number] Date of Birth: 09-22-1950  Date of Discharge from PT service:May 31, 2024   Patient has met 8 of 9 long term goals due to improved activity tolerance, improved balance, increased strength, functional use of  left upper extremity and left lower extremity, improved attention, and improved awareness.  Patient to discharge at an ambulatory level Supervision.   Patient's care partner is independent to provide the necessary supervisory only physical and cognitive assistance  (for awareness) at discharge.  Reasons goals not met: Pt continues to demonstrate reduced but improving awareness of L side and posterior bias. Posterior bias only evident intermittently on stairs and so requires SBA to complete stairs. Is recommended to continue to progress in stair navigation with HHPT.   Recommendation:  Patient will benefit from ongoing skilled PT services in home health setting to continue to advance safe functional mobility, address ongoing impairments in strength, coordination, balance, activity tolerance, cognition, safety awareness, and to minimize fall risk.  Equipment: RW  Reasons for discharge: treatment goals met and discharge from hospital  Patient/family agrees with progress made and goals achieved: Yes  PT Discharge Precautions/Restrictions Precautions Precautions: Fall Precaution/Restrictions Comments: L hemipareisis and reduced awareness of L hemibody Restrictions Weight Bearing Restrictions Per Provider Order: No  Pain Pain Assessment Pain Scale: 0-10 Pain Interference Pain Interference Pain Effect on Sleep: 0. Does not apply - I have not had any pain or hurting in the past 5 days Pain Interference with Therapy Activities: 0. Does not apply - I have not received rehabilitationtherapy in the past 5 days Pain Interference with Day-to-Day Activities: 1. Rarely or not at  all Vision/Perception  Vision - History Ability to See in Adequate Light: 1 Impaired Vision - Assessment Eye Alignment: Within Functional Limits Ocular Range of Motion: Within Functional Limits Alignment/Gaze Preference: Within Defined Limits Tracking/Visual Pursuits: Able to track stimulus in all quads without difficulty Perception Perception: Impaired Preception Impairment Details: Inattention/Neglect Praxis Praxis: Impaired Praxis Impairment Details: Motor planning;Organization (slowly improving from eval but apparent during dressing tasks)  Cognition Overall Cognitive Status: Within Functional Limits for tasks assessed Arousal/Alertness: Awake/alert Orientation Level: Oriented X4 Sustained Attention: Appears intact Memory: Impaired Memory Impairment: Decreased recall of new information Awareness: Impaired Problem Solving: Impaired (slowly improving from eval but still apparent) Executive Function: Self Monitoring;Self Correcting (slowly improving from eval but still apparent) Self Monitoring: Impaired Self Correcting: Impaired Safety/Judgment: Impaired (still apparent but improving from eval) Sensation Sensation Light Touch: Impaired by gross assessment Hot/Cold: Appears Intact Proprioception: Impaired by gross assessment Coordination Gross Motor Movements are Fluid and Coordinated: No Fine Motor Movements are Fluid and Coordinated: No Coordination and Movement Description: L hemibody coordination deficit with decreased proprioception in L LE slowly improving from eval Motor  Motor Motor: Hemiplegia Motor - Discharge Observations: mild L hemiparesis with improving coordination of movement  Mobility Bed Mobility Bed Mobility: Supine to Sit;Rolling Right;Rolling Left;Sit to Supine Rolling Right: Independent Rolling Left: Independent Supine to Sit: Independent Sit to Supine: Independent Transfers Transfers: Sit to Stand;Stand to Sit;Stand Pivot Transfers Sit to  Stand: Independent with assistive device Stand to Sit: Independent with assistive device Stand Pivot Transfers: Supervision/Verbal cueing Stand Pivot Transfer Details: Verbal cues for safe use of DME/AE;Verbal cues for precautions/safety Transfer (Assistive device): Rolling walker Locomotion  Gait Ambulation: Yes Gait Assistance: Supervision/Verbal cueing Gait Distance (Feet): 800 Feet Assistive device: Rolling walker Gait Assistance Details: Verbal cues for precautions/safety;Verbal cues for safe use  of DME/AE Gait Gait: Yes Gait Pattern: Trendelenburg;Poor foot clearance - left (mild impairments improved from eval - intermittent catch of toe with ability to self correct) Gait velocity: decreased Stairs / Additional Locomotion Stairs: Yes Stairs Assistance: Supervision/Verbal cueing;Contact Guard/Touching assist Stair Management Technique: Two rails;Step to pattern;Forwards Number of Stairs: 12 Height of Stairs: 6 Ramp: Supervision/Verbal cueing Curb: Supervision/Verbal cueing Pick up small object from the floor assist level: Supervision/Verbal cueing Wheelchair Mobility Wheelchair Mobility: No  Trunk/Postural Assessment  Cervical Assessment Cervical Assessment: Exceptions to [facility] (forward head) Thoracic Assessment Thoracic Assessment: Exceptions to [facility] (rounded shoulders with mild kyphosis and thoracic shortening on R side 2/2 pelvic instability) Lumbar Assessment Lumbar Assessment: Exceptions to [facility] (posterior pelvic tilt, sitting>standing, R side pelvic elevation/ tilt) Postural Control Postural Control: Deficits on evaluation Righting Reactions: delayed with moderate improvement from eval Protective Responses: delayed with moderate improvement from eval  Balance Balance Balance Assessed: Yes Berg Balance Test Sit to Stand: Able to stand  independently using hands Standing Unsupported: Able to stand 2 minutes with supervision Sitting with Back Unsupported but Feet  Supported on Floor or Stool: Able to sit safely and securely 2 minutes Stand to Sit: Sits safely with minimal use of hands Transfers: Able to transfer safely, definite need of hands Standing Unsupported with Eyes Closed: Able to stand 10 seconds with supervision Standing Ubsupported with Feet Together: Needs help to attain position but able to stand for 30 seconds with feet together From Standing, Reach Forward with Outstretched Arm: Can reach forward >5 cm safely (2) From Standing Position, Pick up Object from Floor: Able to pick up shoe, needs supervision From Standing Position, Turn to Look Behind Over each Shoulder: Turn sideways only but maintains balance Turn 360 Degrees: Needs close supervision or verbal cueing Standing Unsupported, Alternately Place Feet on Step/Stool: Able to complete >2 steps/needs minimal assist Standing Unsupported, One Foot in Front: Needs help to step but can hold 15 seconds Standing on One Leg: Unable to try or needs assist to prevent fall Total Score: 31 Static Sitting Balance Static Sitting - Balance Support: Feet supported Static Sitting - Level of Assistance: 7: Independent Dynamic Sitting Balance Dynamic Sitting - Balance Support: During functional activity;Feet supported;Feet unsupported Dynamic Sitting - Level of Assistance: 6: Modified independent (Device/Increase time) Static Standing Balance Static Standing - Balance Support: During functional activity;Bilateral upper extremity supported Static Standing - Level of Assistance: 6: Modified independent (Device/Increase time) Dynamic Standing Balance Dynamic Standing - Balance Support: During functional activity;Bilateral upper extremity supported Dynamic Standing - Level of Assistance: 5: Stand by assistance Extremity Assessment      RLE Assessment RLE Assessment: Within Functional Limits [name] Strength [name] Overall Strength: Deficits Left Hip Flexion: 3+/5 Left Knee Flexion: 4-/5 Left Knee  Extension: 3+/5 Left Ankle Dorsiflexion: 4-/5 Left Ankle Plantar Flexion: 4-/5   Mliss DELENA Milliner PT, DPT, CSRS 05/31/2024, 6:17 AM  Donald WENDI Pereyra, PT, DPT, CBIS 05/31/24 9:04 AM

## 2024-05-31 NOTE — Progress Notes (Signed)
 PROGRESS NOTE   Subjective/Complaints:  Labs reviewed, stable  Has not had nebs yet, discussed need for these meds at home   ROS: Patient denies CP, SOB, N/V/D  Objective:   No results found.  Recent Labs    05/31/24 0451  WBC 8.3  HGB 11.2*  HCT 33.2*  PLT 174    Recent Labs    05/31/24 0451  NA 140  K 3.6  CL 104  CO2 24  GLUCOSE 135*  BUN 11  CREATININE 0.69  CALCIUM  8.7*     Intake/Output Summary (Last 24 hours) at 05/31/2024 0745 Last data filed at 05/30/2024 1310 Gross per 24 hour  Intake 480 ml  Output --  Net 480 ml        Physical Exam: Vital Signs Blood pressure 133/71, pulse 81, temperature 98.3 F (36.8 C), temperature source Oral, resp. rate 18, height 5' 4 (1.626 m), weight 79.8 kg, SpO2 100%.    General: No acute distress.  Sitting up in bed. Mood and affect are appropriate Heart: Regular rate and rhythm no rubs murmurs or extra sounds Lungs: Clear to auscultation, breathing unlabored, no rales or +wheezes left lung base  Abdomen: Positive bowel sounds, soft nontender to palpation, nondistended Extremities: No clubbing, cyanosis, or edema Skin: + Left forehead Steri-Strips intact, healing + Left orbital ecchymosis resolving + Left elbow skin tear is healing, open area covered in nonadhesive gauze  Neurologic: Cranial nerves II through XII intact, motor strength is 5/5 in right and 4/5 left deltoid, bicep, tricep, grip, hip flexor, knee extensors, ankle dorsiflexor and plantar flexor Fine motor intact BUE Visual fields intact to confrontation testing  Musculoskeletal: Reduced left shoulder flexion and abduction but no pain--unchanged  Assessment/Plan: 1. Functional deficits which require 3+ hours per day of interdisciplinary therapy in a comprehensive inpatient rehab setting. Physiatrist is providing close team supervision and 24 hour management of active medical problems  listed below. Physiatrist and rehab team continue to assess barriers to discharge/monitor patient progress toward functional and medical goals  Care Tool:  Bathing    Body parts bathed by patient: Right arm, Left arm, Chest, Abdomen, Front perineal area, Buttocks, Right upper leg, Left upper leg, Face, Right lower leg, Left lower leg   Body parts bathed by helper: Right lower leg, Left lower leg     Bathing assist Assist Level: Contact Guard/Touching assist     Upper Body Dressing/Undressing Upper body dressing   What is the patient wearing?: Pull over shirt    Upper body assist Assist Level: Contact Guard/Touching assist    Lower Body Dressing/Undressing Lower body dressing      What is the patient wearing?: Pants, Underwear/pull up     Lower body assist Assist for lower body dressing: Contact Guard/Touching assist     Toileting Toileting    Toileting assist Assist for toileting: Contact Guard/Touching assist     Transfers Chair/bed transfer  Transfers assist     Chair/bed transfer assist level: Supervision/Verbal cueing     Locomotion Ambulation   Ambulation assist      Assist level: Supervision/Verbal cueing Assistive device: Walker-rolling Max distance: 800 ft   Walk 10 feet activity  Assist     Assist level: Supervision/Verbal cueing Assistive device: Walker-rolling   Walk 50 feet activity   Assist    Assist level: Supervision/Verbal cueing Assistive device: Walker-rolling    Walk 150 feet activity   Assist Walk 150 feet activity did not occur: Safety/medical concerns  Assist level: Supervision/Verbal cueing Assistive device: Walker-rolling    Walk 10 feet on uneven surface  activity   Assist Walk 10 feet on uneven surfaces activity did not occur: Safety/medical concerns   Assist level: Supervision/Verbal cueing Assistive device: Walker-rolling   Wheelchair     Assist Is the patient using a wheelchair?: No (will  not use on d/c but did use during stay for convenience/ safety) Type of Wheelchair: Manual    Wheelchair assist level: Supervision/Verbal cueing Max wheelchair distance: 50 ft    Wheelchair 50 feet with 2 turns activity    Assist        Assist Level: Supervision/Verbal cueing   Wheelchair 150 feet activity     Assist      Assist Level: Minimal Assistance - Patient > 75%   Blood pressure 133/71, pulse 81, temperature 98.3 F (36.8 C), temperature source Oral, resp. rate 18, height 5' 4 (1.626 m), weight 79.8 kg, SpO2 100%.    Medical Problem List and Plan: 1. Functional deficits secondary to right MCA, ACA and MCA/PCA infarcts embolic pattern secondary to embolic source              -patient may  shower -ELOS/Goals: 06/01/24, supervision PT, OT --Continue CIR therapies including PT, OT  Wants to do OP therapy after d/c but discussed no driving due to L neglect    2.  Antithrombotics: -DVT/anticoagulation:  Pharmaceutical: Lovenox- may d/c amb 120'             -antiplatelet therapy: Plavix (Asprin allergy)   3. Pain Management: Hx chronic pain-Gabapentin  100 mg twice daily.  Tylenol  prn   4. Mood/Behavior/Sleep: LCSW to follow for evaluation and support when available.              -antipsychotic agents: Abilify  20 mg daily  Amitriptyline  100 mg nightly, for mood/sleep  - 10-25: Slept well per sleep log 5. Neuropsych/cognition: This patient IS capable of making decisions on HER own behalf.   6. Skin/Wound Care: Routine pressure relief measures.  - 10-26: Elbow healing, continue current measures   7. Fluids/Electrolytes/Nutrition: Monitor I&O, weights.  Labs in a.m.             -GERD: Pepcid     8. Acute multifocal  infarct: Loop recorder placed by Dr Inocencio, EP prior to CIR- no documented Afib thus far              - Continue Plavix and statin   9. COPD exacerbation:  Pulmicort and DuoNeb as scheduled. Confirmed with medicine prednisone burst and taper  completed              - DuoNeb as needed   10.  Lung CA: Recently diagnosed with lung cancer stage IA.  MRI brain no mets.  Has appt with [facility] 06/12/24 Dr Kerrin   11. AKI: resolved 10/17    Latest Ref Rng & Units 05/31/2024    4:51 AM 05/28/2024    5:20 AM 05/24/2024    5:42 AM  BMP  Glucose 70 - 99 mg/dL 864  867  887   BUN 8 - 23 mg/dL 11  13  20    Creatinine 0.44 - 1.00  mg/dL 9.30  9.20  8.96   Sodium 135 - 145 mmol/L 140  140  140   Potassium 3.5 - 5.1 mmol/L 3.6  3.7  4.3   Chloride 98 - 111 mmol/L 104  105  104   CO2 22 - 32 mmol/L 24  24  28    Calcium  8.9 - 10.3 mg/dL 8.7  8.5  8.8       12. Leukocytosis: reactive secondary to steroids use.  Afebrile      Latest Ref Rng & Units 05/31/2024    4:51 AM 05/28/2024    5:20 AM 05/24/2024    5:42 AM  CBC  WBC 4.0 - 10.5 K/uL 8.3  8.7  15.6   Hemoglobin 12.0 - 15.0 g/dL 88.7  88.0  87.2   Hematocrit 36.0 - 46.0 % 33.2  36.4  39.0   Platelets 150 - 400 K/uL 174  212  286    Resolved off prednisone    13. HTN: Stable, no meds.  Long-term BP goal normotensive Vitals:   05/30/24 2147 05/31/24 0652  BP: 122/71 133/71  Pulse: 71 81  Resp: 18 18  Temp: 98.2 F (36.8 C) 98.3 F (36.8 C)  SpO2: 98% 100%    BP fair - good control , no BP meds - check ortho vitals    14.  HLD: LDL 23 on Crestor  5 mg   15.  T2DM:  A1c 7.4.  Continue CBG monitoring AC/HS with SSI.              - NovoLog  SSI,only using ~3U per day   CBG (last 3)  Recent Labs    05/30/24 1626 05/30/24 2157 05/31/24 0639  GLUCAP 122* 129* 152*    Controlled on metformin  XR 1000mg  BID, d/c SSI  16.  Tobacco abuse: Smoking cessation also provided.   -Nicotine patch      LOS: 14 days A FACE TO FACE EVALUATION WAS PERFORMED  Prentice FORBES Compton 05/31/2024, 7:45 AM

## 2024-05-31 NOTE — Plan of Care (Signed)
  Problem: RH Dressing Goal: LTG Patient will perform lower body dressing w/assist (OT) Description: LTG: Patient will perform lower body dressing with assist, with/without cues in positioning using equipment (OT) Outcome: Not Met (add Reason) Note: Pt requires SUP for safety and MIN verbal cues for clothing orientation.   Problem: RH Grooming Goal: LTG Patient will perform grooming w/assist,cues/equip (OT) Description: LTG: Patient will perform grooming with assist, with/without cues using equipment (OT) Outcome: Completed/Met   Problem: RH Bathing Goal: LTG Patient will bathe all body parts with assist levels (OT) Description: LTG: Patient will bathe all body parts with assist levels (OT) Outcome: Completed/Met   Problem: RH Dressing Goal: LTG Patient will perform upper body dressing (OT) Description: LTG Patient will perform upper body dressing with assist, with/without cues (OT). Outcome: Completed/Met   Problem: RH Toileting Goal: LTG Patient will perform toileting task (3/3 steps) with assistance level (OT) Description: LTG: Patient will perform toileting task (3/3 steps) with assistance level (OT)  Outcome: Completed/Met   Problem: RH Functional Use of Upper Extremity Goal: LTG Patient will use RT/LT upper extremity as a (OT) Description: LTG: Patient will use right/left upper extremity as a stabilizer/gross assist/diminished/nondominant/dominant level with assist, with/without cues during functional activity (OT) Outcome: Completed/Met   Problem: RH Simple Meal Prep Goal: LTG Patient will perform simple meal prep w/assist (OT) Description: LTG: Patient will perform simple meal prep with assistance, with/without cues (OT). Outcome: Completed/Met   Problem: RH Toilet Transfers Goal: LTG Patient will perform toilet transfers w/assist (OT) Description: LTG: Patient will perform toilet transfers with assist, with/without cues using equipment (OT) Outcome: Completed/Met    Problem: RH Tub/Shower Transfers Goal: LTG Patient will perform tub/shower transfers w/assist (OT) Description: LTG: Patient will perform tub/shower transfers with assist, with/without cues using equipment (OT) Outcome: Completed/Met   Problem: RH Awareness Goal: LTG: Patient will demonstrate awareness during functional activites type of (OT) Description: LTG: Patient will demonstrate awareness during functional activites type of (OT) Outcome: Completed/Met

## 2024-05-31 NOTE — Progress Notes (Signed)
 Occupational Therapy Session Note  Patient Details  Name: [name] MRN: [id number] Date of Birth: 1950-09-14  Today's Date: 05/31/2024 OT Individual Time: 1350-1501 OT Individual Time Calculation (min): 71 min    Short Term Goals: Week 2:  OT Short Term Goal 1 (Week 2): STG=LTG d/t ELOS  Skilled Therapeutic Interventions/Progress Updates:  Pt greeted seated in w/c, pt agreeable to OT intervention.      Transfers/bed mobility/functional mobility:  Pt completed functional mobility task in gym with pt instructed to ambulate back and forth to place horseshoes on rim of basketball goal to practice turning with walker and to facilitate improved awareness of [name]. Pt completed task with CGA- supervision.    ADLs:  UB dressing: practiced using button hook with pt needing MIN verbal cues to sequence task.    Footwear: pt practiced with LH shoe horn with pt able to don shoe with supervision, MIN verbal cues provided for sequencing of task.   Transfers: ambulatory toilet transfer MODI with RW Toileting: pt completed 3/3 toileting tasks MODI, continent urine void.    IADLS: Pt completed functional ambulation in apt to collect various items around apt to challenge [name] proprioception with RW mgmt. Pt completed task with supervision, one incidence of [name] getting caught behind RW but no LOB.   Pt completed IADL task of making the bed in the apt to facilitate improved [name] awareness during a functional task, pt completed task with RW and supervision.   Exercises:  Issued pt HEP for home with a focus on standing balance and endurance, reviewed therex as indicated below:   Exercises - Mini Squat with Counter Support  - 1 x daily - 7 x weekly - 3 sets - 10 reps - Standing Hip Abduction with Counter Support  - 1 x daily - 7 x weekly - 3 sets - 10 reps - Standing March with Counter Support  - 1 x daily - 7 x weekly - 3 sets - 10 reps - Standing Hip Extension with Counter Support  - 1 x daily -  7 x weekly - 3 sets - 10 reps                Ended session with pt seated in w/c with all needs within reach and chair alarm activated.                    Therapy Documentation Precautions:  Precautions Precautions: Fall Recall of Precautions/Restrictions: Intact Precaution/Restrictions Comments: L hemipareisis and reduced awareness of L hemibody Restrictions Weight Bearing Restrictions Per Provider Order: No  Pain: No pain    Therapy/Group: Individual Therapy  [name] 05/31/2024, 3:14 PM

## 2024-05-31 NOTE — Progress Notes (Signed)
 Occupational Therapy Discharge Summary  Patient Details  Name: [name] MRN: [id number] Date of Birth: 1951/01/21  Date of Discharge from OT service:May 31, 2024  Today's Date: 05/31/2024 OT Individual Time: 9053-8940 OT Individual Time Calculation (min): 73 min    Patient has met 9 of 10 long term goals due to improved activity tolerance, improved balance, postural control, ability to compensate for deficits, functional use of  LEFT upper and LEFT lower extremity, improved attention, improved awareness, and improved coordination.  Patient to discharge at overall Modified Independent level.  Patient's care partner is independent to provide the necessary cognitive assistance at discharge.    Reasons goals not met: Pt did not meet LB dressing goal d/t problem solving deficits and   Recommendation:  Patient will benefit from ongoing skilled OT services in home health setting to continue to advance functional skills in the area of BADL, iADL, and Reduce care partner burden.  Equipment: No equipment provided Pt has built in shower seat and grab bars.  Reasons for discharge: treatment goals met and discharge from hospital  Patient/family agrees with progress made and goals achieved: Yes  OT Discharge Skilled Therapeutic Interventions: Pt received resting in [facility] presenting to be in good spirits receptive to skilled OT session reporting 0/10 pain- OT offering intermittent rest breaks, repositioning, and therapeutic support to optimize participation in therapy session. Focused this session on d/c planning tasks and ADLs. Pt completed functional mobility to toilet utilizing RW with SUP to increase endurance and RW management. Pt with urine void and completed 3/3 toileting tasks with SUP. Pt ambulated utilizing RW with close SUP. Pt complete shower while seated on TTB with close SUP utilizing long handled sponge to wash feet, utilized grab bars to stand to wash bottom. Pt completed U/LB  dressing with SUP and MIN cues for orientation. Pt donned socks and shoes utilizing long handled shoe horn with SUP for R and MOD A for L. Pt completed hair brushing and blow drying while seated at the sink with SUP. Pt completed functional mobility to rehab gym utilizing RW with close SUP. Engaged Pt in strength and endurance training on Nustep for 10 mins on resistance level 4 to increase overall activity tolerance for ADLs. Pt completed functional mobility back to room utilizing RW with close SUP, with MOD verbal cues for L side awareness and body positioning within the walker. [name], seat alarm on, and all needs met.   Precautions/Restrictions  Precautions Precautions: Fall Recall of Precautions/Restrictions: Intact Precaution/Restrictions Comments: L hemipareisis and reduced awareness of L hemibody Restrictions Weight Bearing Restrictions Per Provider Order: No Pain Pain Assessment Pain Scale: 0-10 Pain Score: 0-No pain ADL ADL Equipment Provided: Reacher Eating: Modified independent Where Assessed-Eating: Chair Grooming: Modified independent Where Assessed-Grooming: Sitting at sink Upper Body Bathing: Supervision/safety Where Assessed-Upper Body Bathing: Shower (seated on TTB) Lower Body Bathing: Supervision/safety Where Assessed-Lower Body Bathing: Shower (seated on TTB, uses grab bar when standing) Upper Body Dressing: Modified independent (Device) Where Assessed-Upper Body Dressing: Chair Lower Body Dressing: Supervision/safety Where Assessed-Lower Body Dressing: Chair (stand to pull over hips, utilizes RW for balance) Toileting: Modified independent Where Assessed-Toileting: Teacher, Adult Education: Engineer, Agricultural Method: Ambulating (RW) Acupuncturist: Engineer, Technical Sales: Close supervison Web Designer Method: Stand pivot, Ambulating (RW) Tub/Shower Equipment: Secondary School Teacher: Close supervision Film/video Editor Method: Ambulating (RW) Astronomer: Sales Promotion Account Executive Baseline Vision/History:  1 Wears glasses (reading glasses) Patient Visual Report: No change from baseline Vision Assessment?: Yes Eye Alignment: Within Functional Limits Ocular Range of Motion: Within Functional Limits Alignment/Gaze Preference: Within Defined Limits Tracking/Visual Pursuits: Able to track stimulus in all quads without difficulty Saccades: Within functional limits Convergence: Within functional limits Visual Fields: Other (comment) (peripheral L visual vield deficit ~45 degrees) Perception  Perception: Impaired Perception-Other Comments: [name] inattention Praxis Praxis: Impaired Praxis Impairment Details: Motor planning (improved from initial evaluation) Cognition Cognition Overall Cognitive Status: Within Functional Limits for tasks assessed Arousal/Alertness: Awake/alert Orientation Level: Person;Place;Situation Person: Oriented Place: Oriented Situation: Oriented Memory: Impaired Memory Impairment: Decreased recall of new information Attention: Sustained Sustained Attention: Appears intact Awareness: Impaired Awareness Impairment: Anticipatory impairment Problem Solving: Impaired (improved from initial eval) Executive Function: Self Monitoring;Self Correcting (improved from initial eval) Self Monitoring: Impaired Self Correcting: Impaired Safety/Judgment: Impaired (improved from initial eval) Brief Interview for Mental Status (BIMS) Repetition of Three Words (First Attempt): 3 Temporal Orientation: Year: Correct Temporal Orientation: Month: Accurate within 5 days Temporal Orientation: Day: Correct Recall: Sock: Yes, no cue required Recall: Blue: Yes, no cue required Recall: Bed: No, could not recall BIMS Summary Score: 13 Sensation Sensation Light Touch: Impaired by gross assessment Hot/Cold:  Appears Intact Proprioception: Impaired by gross assessment Stereognosis: Not tested Coordination Gross Motor Movements are Fluid and Coordinated: No Fine Motor Movements are Fluid and Coordinated: No Coordination and Movement Description: L hemibody coordination deficit, decreased proprioception in L LE, however improved from eval Finger Nose Finger Test: mild slow on L side with shouder compensation, however improved from baseline Motor  Motor Motor: Hemiplegia Motor - Discharge Observations: L hemiparesis with reduced coordination, however improved from initial eval Mobility  Bed Mobility Bed Mobility: Supine to Sit;Sit to Supine Supine to Sit: Independent Sit to Supine: Independent Transfers Sit to Stand: Independent with assistive device Stand to Sit: Independent with assistive device  Trunk/Postural Assessment  Cervical Assessment Cervical Assessment: Exceptions to [facility] (forward head) Thoracic Assessment Thoracic Assessment: Exceptions to [facility] (rouned shoulders) Lumbar Assessment Lumbar Assessment: Exceptions to [facility] (poserior pelvic tilt) Postural Control Postural Control: Deficits on evaluation Righting Reactions: delayed, however improved from initial eval Protective Responses: delayed, however improved from initial eval  Balance Balance Balance Assessed: Yes Berg Balance Test Sit to Stand: Able to stand  independently using hands Standing Unsupported: Able to stand 2 minutes with supervision Sitting with Back Unsupported but Feet Supported on Floor or Stool: Able to sit safely and securely 2 minutes Stand to Sit: Sits safely with minimal use of hands Transfers: Able to transfer safely, definite need of hands Standing Unsupported with Eyes Closed: Able to stand 10 seconds with supervision Standing Ubsupported with Feet Together: Needs help to attain position but able to stand for 30 seconds with feet together From Standing, Reach Forward with Outstretched Arm: Can  reach forward >5 cm safely (2) From Standing Position, Pick up Object from Floor: Able to pick up shoe, needs supervision From Standing Position, Turn to Look Behind Over each Shoulder: Turn sideways only but maintains balance Turn 360 Degrees: Needs close supervision or verbal cueing Standing Unsupported, Alternately Place Feet on Step/Stool: Able to complete >2 steps/needs minimal assist Standing Unsupported, One Foot in Front: Needs help to step but can hold 15 seconds Standing on One Leg: Unable to try or needs assist to prevent fall Total Score: 31 Static Sitting Balance Static Sitting - Balance Support: Feet supported Static Sitting - Level of Assistance: 7: Independent Dynamic Sitting Balance  Dynamic Sitting - Balance Support: During functional activity;Feet supported;Feet unsupported Dynamic Sitting - Level of Assistance: 6: Modified independent (Device/Increase time) Dynamic Sitting - Balance Activities: Forward lean/weight shifting;Reaching for objects Sitting balance - Comments: ADLs Static Standing Balance Static Standing - Balance Support: During functional activity;Bilateral upper extremity supported Static Standing - Level of Assistance: 6: Modified independent (Device/Increase time) Dynamic Standing Balance Dynamic Standing - Balance Support: During functional activity;Bilateral upper extremity supported Dynamic Standing - Level of Assistance: 5: Stand by assistance Dynamic Standing - Balance Activities: Forward lean/weight shifting;Reaching for objects Extremity/Trunk Assessment RUE Assessment RUE Assessment: Within Functional Limits [name] Assessment [name] Assessment: Exceptions to WFL (imapired proprioception and sensation)   Paulina Salinas Schaick 05/31/2024, 10:14 AM

## 2024-06-01 ENCOUNTER — Other Ambulatory Visit (HOSPITAL_COMMUNITY): Payer: Self-pay

## 2024-06-01 DIAGNOSIS — I639 Cerebral infarction, unspecified: Secondary | ICD-10-CM | POA: Diagnosis not present

## 2024-06-01 DIAGNOSIS — D72829 Elevated white blood cell count, unspecified: Secondary | ICD-10-CM | POA: Diagnosis not present

## 2024-06-01 DIAGNOSIS — I63411 Cerebral infarction due to embolism of right middle cerebral artery: Secondary | ICD-10-CM | POA: Diagnosis not present

## 2024-06-01 DIAGNOSIS — J449 Chronic obstructive pulmonary disease, unspecified: Secondary | ICD-10-CM | POA: Diagnosis not present

## 2024-06-01 DIAGNOSIS — C3490 Malignant neoplasm of unspecified part of unspecified bronchus or lung: Secondary | ICD-10-CM | POA: Diagnosis not present

## 2024-06-01 LAB — GLUCOSE, CAPILLARY: Glucose-Capillary: 124 mg/dL — ABNORMAL HIGH (ref 70–99)

## 2024-06-01 NOTE — Progress Notes (Signed)
 PROGRESS NOTE   Subjective/Complaints:  No new c/os , has not been instructed in inhaler use   ROS: Patient denies CP, SOB, N/V/D  Objective:   No results found.  Recent Labs    05/31/24 0451  WBC 8.3  HGB 11.2*  HCT 33.2*  PLT 174    Recent Labs    05/31/24 0451  NA 140  K 3.6  CL 104  CO2 24  GLUCOSE 135*  BUN 11  CREATININE 0.69  CALCIUM  8.7*     Intake/Output Summary (Last 24 hours) at 06/01/2024 0736 Last data filed at 05/31/2024 1300 Gross per 24 hour  Intake 480 ml  Output --  Net 480 ml        Physical Exam: Vital Signs Blood pressure 130/73, pulse 72, temperature 98.2 F (36.8 C), resp. rate 18, height 5' 4 (1.626 m), weight 79.8 kg, SpO2 96%.    General: No acute distress.  Sitting up in bed. Mood and affect are appropriate Heart: Regular rate and rhythm no rubs murmurs or extra sounds Lungs: Clear to auscultation, breathing unlabored, no rales or +wheezes left lung upper lung field  Abdomen: Positive bowel sounds, soft nontender to palpation, nondistended Extremities: No clubbing, cyanosis, or edema Skin: + Left forehead Steri-Strips intact, healing + Left orbital ecchymosis resolving + Left elbow skin tear is healing, open area covered in nonadhesive gauze  Neurologic: Cranial nerves II through XII intact, motor strength is 5/5 in right and 4/5 left deltoid, bicep, tricep, grip, hip flexor, knee extensors, ankle dorsiflexor and plantar flexor Fine motor intact BUE Visual fields intact to confrontation testing  Musculoskeletal: Reduced left shoulder flexion and abduction but no pain--unchanged  Assessment/Plan: 1. Functional deficits due to RIght MCA infarct  Stable for D/C today F/u PCP in 3-4 weeks F/u PM&R 1-2 mo F/u Onc F/u CVTS Dr Kerrin 11/11 appt  F/u neuro 1-2 mo See D/C summary See D/C instructions   Care Tool:  Bathing    Body parts bathed by patient:  Right arm, Left arm, Chest, Abdomen, Front perineal area, Buttocks, Right upper leg, Left upper leg, Face, Right lower leg, Left lower leg   Body parts bathed by helper: Right lower leg, Left lower leg     Bathing assist Assist Level: Supervision/Verbal cueing (seated on TTB) Assistive Device Comment: long handled sponge   Upper Body Dressing/Undressing Upper body dressing   What is the patient wearing?: Pull over shirt, Button up shirt    Upper body assist Assist Level: Independent with assistive device    Lower Body Dressing/Undressing Lower body dressing      What is the patient wearing?: Pants, Underwear/pull up     Lower body assist Assist for lower body dressing: Supervision/Verbal cueing     Toileting Toileting    Toileting assist Assist for toileting: Independent with assistive device     Transfers Chair/bed transfer  Transfers assist     Chair/bed transfer assist level: Supervision/Verbal cueing     Locomotion Ambulation   Ambulation assist      Assist level: Supervision/Verbal cueing Assistive device: Walker-rolling Max distance: 800 ft   Walk 10 feet activity   Assist  Assist level: Supervision/Verbal cueing Assistive device: Walker-rolling   Walk 50 feet activity   Assist    Assist level: Supervision/Verbal cueing Assistive device: Walker-rolling    Walk 150 feet activity   Assist Walk 150 feet activity did not occur: Safety/medical concerns  Assist level: Supervision/Verbal cueing Assistive device: Walker-rolling    Walk 10 feet on uneven surface  activity   Assist Walk 10 feet on uneven surfaces activity did not occur: Safety/medical concerns   Assist level: Supervision/Verbal cueing Assistive device: Walker-rolling   Wheelchair     Assist Is the patient using a wheelchair?: No (will not use on d/c but did use during stay for convenience/ safety) Type of Wheelchair: Manual    Wheelchair assist level:  Supervision/Verbal cueing Max wheelchair distance: 50 ft    Wheelchair 50 feet with 2 turns activity    Assist        Assist Level: Supervision/Verbal cueing   Wheelchair 150 feet activity     Assist      Assist Level: Minimal Assistance - Patient > 75%   Blood pressure 130/73, pulse 72, temperature 98.2 F (36.8 C), resp. rate 18, height 5' 4 (1.626 m), weight 79.8 kg, SpO2 96%.    Medical Problem List and Plan: 1. Functional deficits secondary to right MCA, ACA and MCA/PCA infarcts embolic pattern secondary to embolic source              -patient may  shower -ELOS/Goals: 06/01/24, supervision PT, OT discussed no driving due to L neglect - I will see pt back in f/u in 1-2 mo to re eval    2.  Antithrombotics: -DVT/anticoagulation:  Pharmaceutical: Lovenox- may d/c amb 120'             -antiplatelet therapy: Plavix (Asprin allergy)   3. Pain Management: Hx chronic pain-Gabapentin  100 mg twice daily.  Tylenol  prn   4. Mood/Behavior/Sleep: LCSW to follow for evaluation and support when available.              -antipsychotic agents: Abilify  20 mg daily  Amitriptyline  100 mg nightly, for mood/sleep  - 10-25: Slept well per sleep log 5. Neuropsych/cognition: This patient IS capable of making decisions on HER own behalf.   6. Skin/Wound Care: Routine pressure relief measures.  - 10-26: Elbow healing, continue current measures   7. Fluids/Electrolytes/Nutrition: Monitor I&O, weights.  Labs in a.m.             -GERD: Pepcid     8. Acute multifocal  infarct: Loop recorder placed by Dr Inocencio, EP prior to CIR- no documented Afib thus far              - Continue Plavix and statin   9. COPD exacerbation:  Pulmicort and DuoNeb as scheduled. Confirmed with medicine prednisone burst and taper completed              - DuoNeb as needed   10.  Lung CA: Recently diagnosed with lung cancer stage IA.  MRI brain no mets.  Has appt with CVTS 06/12/24 Dr Kerrin   11. AKI:  resolved 10/17    Latest Ref Rng & Units 05/31/2024    4:51 AM 05/28/2024    5:20 AM 05/24/2024    5:42 AM  BMP  Glucose 70 - 99 mg/dL 864  867  887   BUN 8 - 23 mg/dL 11  13  20    Creatinine 0.44 - 1.00 mg/dL 9.30  9.20  8.96   Sodium  135 - 145 mmol/L 140  140  140   Potassium 3.5 - 5.1 mmol/L 3.6  3.7  4.3   Chloride 98 - 111 mmol/L 104  105  104   CO2 22 - 32 mmol/L 24  24  28    Calcium  8.9 - 10.3 mg/dL 8.7  8.5  8.8       12. Leukocytosis: reactive secondary to steroids use.  Afebrile      Latest Ref Rng & Units 05/31/2024    4:51 AM 05/28/2024    5:20 AM 05/24/2024    5:42 AM  CBC  WBC 4.0 - 10.5 K/uL 8.3  8.7  15.6   Hemoglobin 12.0 - 15.0 g/dL 88.7  88.0  87.2   Hematocrit 36.0 - 46.0 % 33.2  36.4  39.0   Platelets 150 - 400 K/uL 174  212  286    Resolved off prednisone    13. HTN: Stable, no meds.  Long-term BP goal normotensive Vitals:   05/31/24 1950 06/01/24 0535  BP:  130/73  Pulse:  72  Resp:  18  Temp:  98.2 F (36.8 C)  SpO2: 99% 96%    BP fair - good control , no BP meds - check ortho vitals    14.  HLD: LDL 23 on Crestor  5 mg   15.  T2DM:  A1c 7.4.  Continue CBG monitoring AC/HS with SSI.              - NovoLog  SSI,only using ~3U per day   CBG (last 3)  Recent Labs    05/31/24 1622 05/31/24 2104 06/01/24 0619  GLUCAP 147* 119* 124*    Controlled on metformin  XR 1000mg  BID, d/c SSI  16.  Tobacco abuse: Smoking cessation also provided.   -Nicotine patch      LOS: 15 days A FACE TO FACE EVALUATION WAS PERFORMED  Prentice FORBES Compton 06/01/2024, 7:36 AM

## 2024-06-02 DIAGNOSIS — E1122 Type 2 diabetes mellitus with diabetic chronic kidney disease: Secondary | ICD-10-CM | POA: Diagnosis not present

## 2024-06-02 DIAGNOSIS — Z556 Problems related to health literacy: Secondary | ICD-10-CM | POA: Diagnosis not present

## 2024-06-02 DIAGNOSIS — J441 Chronic obstructive pulmonary disease with (acute) exacerbation: Secondary | ICD-10-CM | POA: Diagnosis not present

## 2024-06-02 DIAGNOSIS — G8929 Other chronic pain: Secondary | ICD-10-CM | POA: Diagnosis not present

## 2024-06-02 DIAGNOSIS — R296 Repeated falls: Secondary | ICD-10-CM | POA: Diagnosis not present

## 2024-06-02 DIAGNOSIS — F410 Panic disorder [episodic paroxysmal anxiety] without agoraphobia: Secondary | ICD-10-CM | POA: Diagnosis not present

## 2024-06-02 DIAGNOSIS — C3490 Malignant neoplasm of unspecified part of unspecified bronchus or lung: Secondary | ICD-10-CM | POA: Diagnosis not present

## 2024-06-02 DIAGNOSIS — F1011 Alcohol abuse, in remission: Secondary | ICD-10-CM | POA: Diagnosis not present

## 2024-06-02 DIAGNOSIS — K219 Gastro-esophageal reflux disease without esophagitis: Secondary | ICD-10-CM | POA: Diagnosis not present

## 2024-06-02 DIAGNOSIS — E042 Nontoxic multinodular goiter: Secondary | ICD-10-CM | POA: Diagnosis not present

## 2024-06-02 DIAGNOSIS — R918 Other nonspecific abnormal finding of lung field: Secondary | ICD-10-CM | POA: Diagnosis not present

## 2024-06-02 DIAGNOSIS — G43009 Migraine without aura, not intractable, without status migrainosus: Secondary | ICD-10-CM | POA: Diagnosis not present

## 2024-06-02 DIAGNOSIS — F331 Major depressive disorder, recurrent, moderate: Secondary | ICD-10-CM | POA: Diagnosis not present

## 2024-06-02 DIAGNOSIS — I129 Hypertensive chronic kidney disease with stage 1 through stage 4 chronic kidney disease, or unspecified chronic kidney disease: Secondary | ICD-10-CM | POA: Diagnosis not present

## 2024-06-02 DIAGNOSIS — M5410 Radiculopathy, site unspecified: Secondary | ICD-10-CM | POA: Diagnosis not present

## 2024-06-02 DIAGNOSIS — E785 Hyperlipidemia, unspecified: Secondary | ICD-10-CM | POA: Diagnosis not present

## 2024-06-02 DIAGNOSIS — M1990 Unspecified osteoarthritis, unspecified site: Secondary | ICD-10-CM | POA: Diagnosis not present

## 2024-06-02 DIAGNOSIS — G720 Drug-induced myopathy: Secondary | ICD-10-CM | POA: Diagnosis not present

## 2024-06-02 DIAGNOSIS — J432 Centrilobular emphysema: Secondary | ICD-10-CM | POA: Diagnosis not present

## 2024-06-02 DIAGNOSIS — R32 Unspecified urinary incontinence: Secondary | ICD-10-CM | POA: Diagnosis not present

## 2024-06-02 DIAGNOSIS — I2510 Atherosclerotic heart disease of native coronary artery without angina pectoris: Secondary | ICD-10-CM | POA: Diagnosis not present

## 2024-06-02 DIAGNOSIS — N182 Chronic kidney disease, stage 2 (mild): Secondary | ICD-10-CM | POA: Diagnosis not present

## 2024-06-02 DIAGNOSIS — I69354 Hemiplegia and hemiparesis following cerebral infarction affecting left non-dominant side: Secondary | ICD-10-CM | POA: Diagnosis not present

## 2024-06-02 DIAGNOSIS — F17210 Nicotine dependence, cigarettes, uncomplicated: Secondary | ICD-10-CM | POA: Diagnosis not present

## 2024-06-07 DIAGNOSIS — I10 Essential (primary) hypertension: Secondary | ICD-10-CM | POA: Diagnosis not present

## 2024-06-07 DIAGNOSIS — Z8673 Personal history of transient ischemic attack (TIA), and cerebral infarction without residual deficits: Secondary | ICD-10-CM | POA: Diagnosis not present

## 2024-06-07 DIAGNOSIS — R29898 Other symptoms and signs involving the musculoskeletal system: Secondary | ICD-10-CM | POA: Diagnosis not present

## 2024-06-07 DIAGNOSIS — E1121 Type 2 diabetes mellitus with diabetic nephropathy: Secondary | ICD-10-CM | POA: Diagnosis not present

## 2024-06-07 DIAGNOSIS — C3490 Malignant neoplasm of unspecified part of unspecified bronchus or lung: Secondary | ICD-10-CM | POA: Diagnosis not present

## 2024-06-07 LAB — ACID FAST CULTURE WITH REFLEXED SENSITIVITIES (MYCOBACTERIA): Acid Fast Culture: NEGATIVE

## 2024-06-08 DIAGNOSIS — J441 Chronic obstructive pulmonary disease with (acute) exacerbation: Secondary | ICD-10-CM | POA: Diagnosis not present

## 2024-06-08 DIAGNOSIS — C3490 Malignant neoplasm of unspecified part of unspecified bronchus or lung: Secondary | ICD-10-CM | POA: Diagnosis not present

## 2024-06-08 DIAGNOSIS — F1011 Alcohol abuse, in remission: Secondary | ICD-10-CM | POA: Diagnosis not present

## 2024-06-08 DIAGNOSIS — E1122 Type 2 diabetes mellitus with diabetic chronic kidney disease: Secondary | ICD-10-CM | POA: Diagnosis not present

## 2024-06-08 DIAGNOSIS — I2510 Atherosclerotic heart disease of native coronary artery without angina pectoris: Secondary | ICD-10-CM | POA: Diagnosis not present

## 2024-06-08 DIAGNOSIS — Z556 Problems related to health literacy: Secondary | ICD-10-CM | POA: Diagnosis not present

## 2024-06-08 DIAGNOSIS — G8929 Other chronic pain: Secondary | ICD-10-CM | POA: Diagnosis not present

## 2024-06-08 DIAGNOSIS — E785 Hyperlipidemia, unspecified: Secondary | ICD-10-CM | POA: Diagnosis not present

## 2024-06-08 DIAGNOSIS — N182 Chronic kidney disease, stage 2 (mild): Secondary | ICD-10-CM | POA: Diagnosis not present

## 2024-06-08 DIAGNOSIS — G720 Drug-induced myopathy: Secondary | ICD-10-CM | POA: Diagnosis not present

## 2024-06-08 DIAGNOSIS — R32 Unspecified urinary incontinence: Secondary | ICD-10-CM | POA: Diagnosis not present

## 2024-06-08 DIAGNOSIS — F331 Major depressive disorder, recurrent, moderate: Secondary | ICD-10-CM | POA: Diagnosis not present

## 2024-06-08 DIAGNOSIS — M5410 Radiculopathy, site unspecified: Secondary | ICD-10-CM | POA: Diagnosis not present

## 2024-06-08 DIAGNOSIS — F17210 Nicotine dependence, cigarettes, uncomplicated: Secondary | ICD-10-CM | POA: Diagnosis not present

## 2024-06-08 DIAGNOSIS — K219 Gastro-esophageal reflux disease without esophagitis: Secondary | ICD-10-CM | POA: Diagnosis not present

## 2024-06-08 DIAGNOSIS — F410 Panic disorder [episodic paroxysmal anxiety] without agoraphobia: Secondary | ICD-10-CM | POA: Diagnosis not present

## 2024-06-08 DIAGNOSIS — J432 Centrilobular emphysema: Secondary | ICD-10-CM | POA: Diagnosis not present

## 2024-06-08 DIAGNOSIS — I69354 Hemiplegia and hemiparesis following cerebral infarction affecting left non-dominant side: Secondary | ICD-10-CM | POA: Diagnosis not present

## 2024-06-08 DIAGNOSIS — R296 Repeated falls: Secondary | ICD-10-CM | POA: Diagnosis not present

## 2024-06-08 DIAGNOSIS — E042 Nontoxic multinodular goiter: Secondary | ICD-10-CM | POA: Diagnosis not present

## 2024-06-08 DIAGNOSIS — M1990 Unspecified osteoarthritis, unspecified site: Secondary | ICD-10-CM | POA: Diagnosis not present

## 2024-06-08 DIAGNOSIS — G43009 Migraine without aura, not intractable, without status migrainosus: Secondary | ICD-10-CM | POA: Diagnosis not present

## 2024-06-08 DIAGNOSIS — I129 Hypertensive chronic kidney disease with stage 1 through stage 4 chronic kidney disease, or unspecified chronic kidney disease: Secondary | ICD-10-CM | POA: Diagnosis not present

## 2024-06-08 DIAGNOSIS — R918 Other nonspecific abnormal finding of lung field: Secondary | ICD-10-CM | POA: Diagnosis not present

## 2024-06-12 ENCOUNTER — Encounter: Payer: Self-pay | Admitting: Thoracic Surgery (Cardiothoracic Vascular Surgery)

## 2024-06-12 ENCOUNTER — Ambulatory Visit
Attending: Thoracic Surgery (Cardiothoracic Vascular Surgery) | Admitting: Thoracic Surgery (Cardiothoracic Vascular Surgery)

## 2024-06-12 VITALS — BP 164/110 | HR 102 | Resp 20 | Ht 64.0 in | Wt 185.5 lb

## 2024-06-12 DIAGNOSIS — R918 Other nonspecific abnormal finding of lung field: Secondary | ICD-10-CM | POA: Diagnosis not present

## 2024-06-12 NOTE — Progress Notes (Signed)
 PCP is [name], FNP Referring Provider is Ruthell Lauraine FALCON, NP  Chief Complaint  Patient presents with   Lung Lesion    New patient consultation, review all studies    HPI: 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  [name] is a 73 year old woman with a past medical history significant for tobacco abuse, COPD, chronic bronchitis, colon cancer, hypertension, hyperlipidemia, type 2 diabetes, thyroid  nodule, arthritis, migraines, anxiety, depression, arthritis, and recent stroke.  Found to have a left upper lobe nodule on low-dose CT for lung cancer screening.  Underwent robotic bronchoscopy by Dr. Shelah.  Biopsy showed squamous cell carcinoma.  Originally scheduled to see me about a month ago.  She was admitted to [facility] with a stroke with a left hemiparesis.  She then went to rehab and was finally discharged on 06/01/2024.  Her left arm and hand have improved significantly.  Still has left leg weakness.  Walking with a walker.  She did quit smoking while she was in the hospital.  Past Medical History:  Diagnosis Date   Alcohol abuse, in remission    Anxiety    Cancer of sigmoid colon (HCC) 07/2005   T2N0M0   Chronic bronchitis    COPD (chronic obstructive pulmonary disease) (HCC)    Depression    Diabetes mellitus without complication (HCC)    GERD (gastroesophageal reflux disease)    History of thyroid  nodule    monitoring   Hyperlipidemia    Hypertension    Migraines    OA (osteoarthritis)    PONV (postoperative nausea and vomiting)    Reflux     Past Surgical History:  Procedure Laterality Date   ABDOMINAL EXPOSURE N/A 11/24/2016   Procedure: ABDOMINAL EXPOSURE;  Surgeon: Krystal FALCON Doing, MD;  Location: [facility] OR;  Service: Vascular;  Laterality: N/A;   ABDOMINAL HYSTERECTOMY  1992   age 49 and appendics   ANTERIOR LUMBAR FUSION Bilateral 11/24/2016   Procedure: LUMBAR 5-SACRUM 1 ANTERIOR  LUMBAR INTERBODY FUSION;  Surgeon: Oneil Priestly, MD;  Location: [facility] OR;  Service: Orthopedics;  Laterality: Bilateral;  LUMBAR 5-SACRUM 1 ANTERIOR LUMBAR INTERBODY FUSION    ARTHRODESIS METATARSALPHALANGEAL JOINT (MTPJ) Left 08/15/2023   Procedure: ARTHRODESIS METATARSALPHALANGEAL JOINT (MTPJ);  Surgeon: Tobie Franky SQUIBB, DPM;  Location: [facility] ORS;  Service: Orthopedics/Podiatry;  Laterality: Left;  POPLITEAL/SAPHENOUS BLOCK   bilateral breast surgery      benign tumors removed   CHOLECYSTECTOMY  2007   COLON RESECTION  2006   COLONOSCOPY  2019   HAND SURGERY  2010   LOOP RECORDER INSERTION N/A 05/17/2024   Procedure: LOOP RECORDER INSERTION;  Surgeon: Inocencio Soyla Lunger, MD;  Location: [facility] INVASIVE CV LAB;  Service: Cardiovascular;  Laterality: N/A;   repair of incisional hernia  2012   SHOULDER ARTHROSCOPY Left 2009   SHOULDER SURGERY Right 1997   rotator cuff   TONSILLECTOMY     as a child   TOTAL KNEE ARTHROPLASTY Left 08/11/2021   Procedure: TOTAL KNEE ARTHROPLASTY;  Surgeon: Beverley Evalene BIRCH, MD;  Location: [facility] ORS;  Service: Orthopedics;  Laterality: Left;   TOTAL KNEE ARTHROPLASTY Right 06/29/2022   Procedure: TOTAL KNEE ARTHROPLASTY;  Surgeon: Beverley Evalene BIRCH, MD;  Location: [facility] ORS;  Service: Orthopedics;  Laterality: Right;   VIDEO BRONCHOSCOPY WITH ENDOBRONCHIAL NAVIGATION N/A 04/23/2024   Procedure: VIDEO BRONCHOSCOPY WITH ENDOBRONCHIAL NAVIGATION;  Surgeon: Shelah Lamar RAMAN, MD;  Location: [facility] ENDOSCOPY;  Service:  Pulmonary;  Laterality: N/A;  lingular nodule    Family History  Problem Relation Age of Onset   Heart disease Mother        Previous MI at age 87-55   Atrial fibrillation Mother    Stroke Mother    Heart failure Mother    Seizures Father        poss d/t alcohol   Cancer Father 71       Prostate cancer    Diabetes Sister    Arrhythmia Sister    Cancer Maternal Aunt        lung cancer    Cancer Maternal Uncle        kidney ca    Social History Social  History   Tobacco Use   Smoking status: Former    Current packs/day: 0.75    Average packs/day: 0.8 packs/day for 54.9 years (41.1 ttl pk-yrs)    Types: Cigarettes, E-cigarettes    Start date: 1971   Smokeless tobacco: Never   Tobacco comments:    7 cigerettes  a day, using e cigarette(very little), states she quit 05/12/24  Vaping Use   Vaping status: Some Days   Substances: Nicotine, Flavoring  Substance Use Topics   Alcohol use: No    Comment: recovering  alcoholic   Drug use: No    Current Outpatient Medications  Medication Sig Dispense Refill   albuterol  (VENTOLIN  HFA) 108 (90 Base) MCG/ACT inhaler Inhale 1-2 puffs into the lungs every 4 (four) hours as needed for wheezing or shortness of breath.     amitriptyline  (ELAVIL ) 25 MG tablet Take 100 mg by mouth at bedtime.     ARIPiprazole  (ABILIFY ) 20 MG tablet Take 20 mg by mouth daily.     calcium  carbonate (TUMS - DOSED IN MG ELEMENTAL CALCIUM ) 500 MG chewable tablet Chew 2 tablets by mouth 4 (four) times daily as needed for indigestion or heartburn.     Cholecalciferol  1.25 MG (50000 UT) capsule Take 50,000 Units by mouth every 7 (seven) days.     clopidogrel (PLAVIX) 75 MG tablet Take 1 tablet (75 mg total) by mouth daily. 30 tablet 3   famotidine  (PEPCID ) 20 MG tablet Take 1 tablet (20 mg total) by mouth 2 (two) times daily. 60 tablet 0   Fluticasone Furoate (ARNUITY ELLIPTA) 100 MCG/ACT AEPB Inhale 1 puff into the lungs daily. 30 each 0   gabapentin  (NEURONTIN ) 100 MG capsule Take 100 mg by mouth 2 (two) times daily.     metFORMIN  (GLUCOPHAGE -XR) 500 MG 24 hr tablet Take 1,000 mg by mouth 2 (two) times daily.     nicotine (NICODERM CQ - DOSED IN MG/24 HOURS) 14 mg/24hr patch Place 1 patch (14 mg total) onto the skin daily. 28 patch 0   rosuvastatin  (CRESTOR ) 5 MG tablet Take 5 mg by mouth daily.     No current facility-administered medications for this visit.    Allergies  Allergen Reactions   Bc Cherry  [Aspirin -Caffeine] Anaphylaxis   Benadryl  [Diphenhydramine  Hcl] Nausea And Vomiting and Other (See Comments)    Migraine headaches.   Lansoprazole Nausea And Vomiting and Other (See Comments)    Migraine headache  Prevacid* due to dye in med    Morphine Sulfate Nausea And Vomiting and Other (See Comments)    Migraine headaches.   Red Dye #40 (Allura Red) Other (See Comments)    Also allergic to [facility] Dye-migraine headaches.   Simvastatin Nausea And Vomiting and Other (See Comments)  Migraine    Cephalexin     Other Reaction(s): severe fatigue   Macrolides And Ketolides Nausea And Vomiting   Other     Brown dye,medicine with brown dye   Codeine Nausea And Vomiting   Penicillins Nausea And Vomiting and Other (See Comments)    INTOLERANCE >  NAUSEA & VOMITING YEAST INFECTION > CHANGE IN NORMAL FLORA   Shellfish Allergy Other (See Comments)    Congestion  Other Reaction(s): migraine   Sulfa Antibiotics Nausea And Vomiting    Review of Systems  Constitutional:  Positive for activity change.  Eyes:  Negative for visual disturbance.  Respiratory:  Positive for shortness of breath.   Cardiovascular:  Negative for chest pain.  Musculoskeletal:  Positive for arthralgias and joint swelling.  Neurological:  Positive for weakness and headaches. Negative for seizures.    BP (!) 164/110   Pulse (!) 102   Resp 20   Ht 5' 4 (1.626 m)   Wt 185 lb 8 oz (84.1 kg)   SpO2 97%   BMI 31.84 kg/m  Physical Exam Vitals reviewed.  Constitutional:      General: She is not in acute distress. HENT:     Head: Normocephalic and atraumatic.  Eyes:     General: No scleral icterus.    Extraocular Movements: Extraocular movements intact.  Cardiovascular:     Rate and Rhythm: Normal rate and regular rhythm.     Heart sounds: Normal heart sounds. No murmur heard. Pulmonary:     Effort: Pulmonary effort is normal. No respiratory distress.     Breath sounds: Normal breath sounds. No wheezing.   Abdominal:     General: There is no distension.     Palpations: Abdomen is soft.  Musculoskeletal:     Cervical back: Neck supple.  Lymphadenopathy:     Cervical: No cervical adenopathy.  Neurological:     Mental Status: She is alert and oriented to person, place, and time.     Motor: Weakness (Left lower extremity) present.     Gait: Gait abnormal.     Diagnostic Tests: NUCLEAR MEDICINE PET SKULL BASE TO THIGH   TECHNIQUE: 8.9 mCi F-18 FDG was injected intravenously. Full-ring PET imaging was performed from the skull base to thigh after the radiotracer. CT data was obtained and used for attenuation correction and anatomic localization.   Fasting blood glucose: 139 mg/dl   COMPARISON:  91/79/7974 lung cancer screening CT. PET of 12/20/2023 also reviewed.   FINDINGS: Mediastinal blood pool activity: SUV max 3.1   Liver activity: SUV max NA   NECK: No areas of abnormal hypermetabolism.   Incidental CT findings: Bilateral carotid atherosclerosis. A left-sided 1.6 cm thyroid  nodule has been evaluated on 06/03/2022 ultrasound. No cervical adenopathy.   CHEST: No thoracic nodal hypermetabolism. The lingular nodule is hypermetabolic. 1.6 x 1.3 cm and a S.U.V. max of 4.6 cm 36/7. Adjacent biopsy fiducial.   Incidental CT findings: Centrilobular emphysema. Aortic and coronary artery calcification.   ABDOMEN/PELVIS: No abdominopelvic parenchymal or nodal hypermetabolism.   Incidental CT findings: Normal adrenal glands. Hepatic morphology again suspicious for mild cirrhosis. Cholecystectomy. Fat containing ventral abdominal wall laxity. Hysterectomy.   SKELETON: No abnormal marrow activity.   Incidental CT findings: Lumbosacral spine fixation.   IMPRESSION: 1. Hypermetabolic 1.6 cm lingular nodule, without thoracic nodal or distant metastasis. T1b, N0, M0. Stage 1A. 2. Incidental findings, including: Aortic atherosclerosis (ICD10-I70.0), coronary artery  atherosclerosis and emphysema (ICD10-J43.9). Probable mild cirrhosis.     Electronically  Signed   By: Rockey Kilts M.D.   On: 05/10/2024 15:08   I personally reviewed the CT and PET/CT images.  There is a 16 x 13 mm lingular nodule that is hypermetabolic with an SUV of 4.6.  No mediastinal or hilar adenopathy.  No evidence of distant metastases.   Impression: [name] is a 72 year old woman with a past medical history significant for tobacco abuse, COPD, chronic bronchitis, colon cancer, hypertension, hyperlipidemia, type 2 diabetes, thyroid  nodule, arthritis, migraines, anxiety, depression, arthritis, and recent stroke.  Clinical stage Ia squamous cell carcinoma left upper lobe-T1b, N0.  Under normal circumstances would be a good operative candidate.  However in the setting of a recent stroke, her best option is stereotactic radiation.  Any survival benefit from surgery is offset by the risk of stroke this early after her previous event.  She should be a good candidate for stereotactic radiation.  Tobacco use-quit smoking after stroke.  Using nicotine patches.  Right CVA with left hemiparesis-has had good progress with rehab.  Continue with PT OT.  Plan: Refer to radiation oncology and medical oncology (Dr. Sherrod). I will be happy to see Mrs. Duba back anytime if I can be of any further assistance with her care  Elspeth JAYSON Millers, MD [facility] [phone]

## 2024-06-13 DIAGNOSIS — E042 Nontoxic multinodular goiter: Secondary | ICD-10-CM | POA: Diagnosis not present

## 2024-06-13 DIAGNOSIS — K219 Gastro-esophageal reflux disease without esophagitis: Secondary | ICD-10-CM | POA: Diagnosis not present

## 2024-06-13 DIAGNOSIS — G720 Drug-induced myopathy: Secondary | ICD-10-CM | POA: Diagnosis not present

## 2024-06-13 DIAGNOSIS — F410 Panic disorder [episodic paroxysmal anxiety] without agoraphobia: Secondary | ICD-10-CM | POA: Diagnosis not present

## 2024-06-13 DIAGNOSIS — N182 Chronic kidney disease, stage 2 (mild): Secondary | ICD-10-CM | POA: Diagnosis not present

## 2024-06-13 DIAGNOSIS — F1011 Alcohol abuse, in remission: Secondary | ICD-10-CM | POA: Diagnosis not present

## 2024-06-13 DIAGNOSIS — R296 Repeated falls: Secondary | ICD-10-CM | POA: Diagnosis not present

## 2024-06-13 DIAGNOSIS — G43009 Migraine without aura, not intractable, without status migrainosus: Secondary | ICD-10-CM | POA: Diagnosis not present

## 2024-06-13 DIAGNOSIS — J432 Centrilobular emphysema: Secondary | ICD-10-CM | POA: Diagnosis not present

## 2024-06-13 DIAGNOSIS — F17210 Nicotine dependence, cigarettes, uncomplicated: Secondary | ICD-10-CM | POA: Diagnosis not present

## 2024-06-13 DIAGNOSIS — I69354 Hemiplegia and hemiparesis following cerebral infarction affecting left non-dominant side: Secondary | ICD-10-CM | POA: Diagnosis not present

## 2024-06-13 DIAGNOSIS — E1122 Type 2 diabetes mellitus with diabetic chronic kidney disease: Secondary | ICD-10-CM | POA: Diagnosis not present

## 2024-06-13 DIAGNOSIS — F331 Major depressive disorder, recurrent, moderate: Secondary | ICD-10-CM | POA: Diagnosis not present

## 2024-06-13 DIAGNOSIS — E785 Hyperlipidemia, unspecified: Secondary | ICD-10-CM | POA: Diagnosis not present

## 2024-06-13 DIAGNOSIS — I2510 Atherosclerotic heart disease of native coronary artery without angina pectoris: Secondary | ICD-10-CM | POA: Diagnosis not present

## 2024-06-13 DIAGNOSIS — C3490 Malignant neoplasm of unspecified part of unspecified bronchus or lung: Secondary | ICD-10-CM | POA: Diagnosis not present

## 2024-06-13 DIAGNOSIS — M5410 Radiculopathy, site unspecified: Secondary | ICD-10-CM | POA: Diagnosis not present

## 2024-06-13 DIAGNOSIS — Z556 Problems related to health literacy: Secondary | ICD-10-CM | POA: Diagnosis not present

## 2024-06-13 DIAGNOSIS — G8929 Other chronic pain: Secondary | ICD-10-CM | POA: Diagnosis not present

## 2024-06-13 DIAGNOSIS — J441 Chronic obstructive pulmonary disease with (acute) exacerbation: Secondary | ICD-10-CM | POA: Diagnosis not present

## 2024-06-13 DIAGNOSIS — R32 Unspecified urinary incontinence: Secondary | ICD-10-CM | POA: Diagnosis not present

## 2024-06-13 DIAGNOSIS — M1990 Unspecified osteoarthritis, unspecified site: Secondary | ICD-10-CM | POA: Diagnosis not present

## 2024-06-13 DIAGNOSIS — I129 Hypertensive chronic kidney disease with stage 1 through stage 4 chronic kidney disease, or unspecified chronic kidney disease: Secondary | ICD-10-CM | POA: Diagnosis not present

## 2024-06-13 DIAGNOSIS — R918 Other nonspecific abnormal finding of lung field: Secondary | ICD-10-CM | POA: Diagnosis not present

## 2024-06-14 ENCOUNTER — Ambulatory Visit

## 2024-06-14 ENCOUNTER — Ambulatory Visit: Admitting: Radiation Oncology

## 2024-06-14 NOTE — Progress Notes (Incomplete)
 Location of tumor and Histology per Pathology Report: Left upper lobe  Biopsy:    Past/Anticipated interventions by surgeon/ pulmonology , if any:    Past/Anticipated interventions by medical oncology, if any: Patient to see Dr. Gatha on 06/26/24    Pain issues, if any:  {:18581} {PAIN DESCRIPTION:21022940}  SAFETY ISSUES: Prior radiation? {:18581} Pacemaker/ICD? {:18581} Possible current pregnancy? no Is the patient on methotrexate? {:18581}  Current Complaints / other details:  ***     ***

## 2024-06-15 ENCOUNTER — Ambulatory Visit
Admission: RE | Admit: 2024-06-15 | Discharge: 2024-06-15 | Disposition: A | Discharge status: Home / Self Care | Source: Ambulatory Visit | Attending: Radiation Oncology | Admitting: Radiation Oncology

## 2024-06-15 DIAGNOSIS — C3412 Malignant neoplasm of upper lobe, left bronchus or lung: Secondary | ICD-10-CM | POA: Diagnosis not present

## 2024-06-19 DIAGNOSIS — R918 Other nonspecific abnormal finding of lung field: Secondary | ICD-10-CM | POA: Diagnosis not present

## 2024-06-19 DIAGNOSIS — R296 Repeated falls: Secondary | ICD-10-CM | POA: Diagnosis not present

## 2024-06-19 DIAGNOSIS — F410 Panic disorder [episodic paroxysmal anxiety] without agoraphobia: Secondary | ICD-10-CM | POA: Diagnosis not present

## 2024-06-19 DIAGNOSIS — F1011 Alcohol abuse, in remission: Secondary | ICD-10-CM | POA: Diagnosis not present

## 2024-06-19 DIAGNOSIS — E042 Nontoxic multinodular goiter: Secondary | ICD-10-CM | POA: Diagnosis not present

## 2024-06-19 DIAGNOSIS — J441 Chronic obstructive pulmonary disease with (acute) exacerbation: Secondary | ICD-10-CM | POA: Diagnosis not present

## 2024-06-19 DIAGNOSIS — I129 Hypertensive chronic kidney disease with stage 1 through stage 4 chronic kidney disease, or unspecified chronic kidney disease: Secondary | ICD-10-CM | POA: Diagnosis not present

## 2024-06-19 DIAGNOSIS — M1990 Unspecified osteoarthritis, unspecified site: Secondary | ICD-10-CM | POA: Diagnosis not present

## 2024-06-19 DIAGNOSIS — M5410 Radiculopathy, site unspecified: Secondary | ICD-10-CM | POA: Diagnosis not present

## 2024-06-19 DIAGNOSIS — I2510 Atherosclerotic heart disease of native coronary artery without angina pectoris: Secondary | ICD-10-CM | POA: Diagnosis not present

## 2024-06-19 DIAGNOSIS — E1122 Type 2 diabetes mellitus with diabetic chronic kidney disease: Secondary | ICD-10-CM | POA: Diagnosis not present

## 2024-06-19 DIAGNOSIS — R32 Unspecified urinary incontinence: Secondary | ICD-10-CM | POA: Diagnosis not present

## 2024-06-19 DIAGNOSIS — E785 Hyperlipidemia, unspecified: Secondary | ICD-10-CM | POA: Diagnosis not present

## 2024-06-19 DIAGNOSIS — I69354 Hemiplegia and hemiparesis following cerebral infarction affecting left non-dominant side: Secondary | ICD-10-CM | POA: Diagnosis not present

## 2024-06-19 DIAGNOSIS — J432 Centrilobular emphysema: Secondary | ICD-10-CM | POA: Diagnosis not present

## 2024-06-19 DIAGNOSIS — K219 Gastro-esophageal reflux disease without esophagitis: Secondary | ICD-10-CM | POA: Diagnosis not present

## 2024-06-19 DIAGNOSIS — G43009 Migraine without aura, not intractable, without status migrainosus: Secondary | ICD-10-CM | POA: Diagnosis not present

## 2024-06-19 DIAGNOSIS — Z556 Problems related to health literacy: Secondary | ICD-10-CM | POA: Diagnosis not present

## 2024-06-19 DIAGNOSIS — F17210 Nicotine dependence, cigarettes, uncomplicated: Secondary | ICD-10-CM | POA: Diagnosis not present

## 2024-06-19 DIAGNOSIS — F331 Major depressive disorder, recurrent, moderate: Secondary | ICD-10-CM | POA: Diagnosis not present

## 2024-06-19 DIAGNOSIS — C3490 Malignant neoplasm of unspecified part of unspecified bronchus or lung: Secondary | ICD-10-CM | POA: Diagnosis not present

## 2024-06-19 DIAGNOSIS — G8929 Other chronic pain: Secondary | ICD-10-CM | POA: Diagnosis not present

## 2024-06-19 DIAGNOSIS — N182 Chronic kidney disease, stage 2 (mild): Secondary | ICD-10-CM | POA: Diagnosis not present

## 2024-06-19 DIAGNOSIS — G720 Drug-induced myopathy: Secondary | ICD-10-CM | POA: Diagnosis not present

## 2024-06-22 ENCOUNTER — Ambulatory Visit: Attending: Cardiology

## 2024-06-22 DIAGNOSIS — N182 Chronic kidney disease, stage 2 (mild): Secondary | ICD-10-CM | POA: Diagnosis not present

## 2024-06-22 DIAGNOSIS — F410 Panic disorder [episodic paroxysmal anxiety] without agoraphobia: Secondary | ICD-10-CM | POA: Diagnosis not present

## 2024-06-22 DIAGNOSIS — I129 Hypertensive chronic kidney disease with stage 1 through stage 4 chronic kidney disease, or unspecified chronic kidney disease: Secondary | ICD-10-CM | POA: Diagnosis not present

## 2024-06-22 DIAGNOSIS — F1011 Alcohol abuse, in remission: Secondary | ICD-10-CM | POA: Diagnosis not present

## 2024-06-22 DIAGNOSIS — I69354 Hemiplegia and hemiparesis following cerebral infarction affecting left non-dominant side: Secondary | ICD-10-CM | POA: Diagnosis not present

## 2024-06-22 DIAGNOSIS — G43009 Migraine without aura, not intractable, without status migrainosus: Secondary | ICD-10-CM | POA: Diagnosis not present

## 2024-06-22 DIAGNOSIS — J432 Centrilobular emphysema: Secondary | ICD-10-CM | POA: Diagnosis not present

## 2024-06-22 DIAGNOSIS — I2510 Atherosclerotic heart disease of native coronary artery without angina pectoris: Secondary | ICD-10-CM | POA: Diagnosis not present

## 2024-06-22 DIAGNOSIS — I639 Cerebral infarction, unspecified: Secondary | ICD-10-CM | POA: Diagnosis not present

## 2024-06-22 DIAGNOSIS — F331 Major depressive disorder, recurrent, moderate: Secondary | ICD-10-CM | POA: Diagnosis not present

## 2024-06-22 DIAGNOSIS — R296 Repeated falls: Secondary | ICD-10-CM | POA: Diagnosis not present

## 2024-06-22 DIAGNOSIS — Z556 Problems related to health literacy: Secondary | ICD-10-CM | POA: Diagnosis not present

## 2024-06-22 DIAGNOSIS — R32 Unspecified urinary incontinence: Secondary | ICD-10-CM | POA: Diagnosis not present

## 2024-06-22 DIAGNOSIS — M5410 Radiculopathy, site unspecified: Secondary | ICD-10-CM | POA: Diagnosis not present

## 2024-06-22 DIAGNOSIS — G8929 Other chronic pain: Secondary | ICD-10-CM | POA: Diagnosis not present

## 2024-06-22 DIAGNOSIS — M1990 Unspecified osteoarthritis, unspecified site: Secondary | ICD-10-CM | POA: Diagnosis not present

## 2024-06-22 DIAGNOSIS — K219 Gastro-esophageal reflux disease without esophagitis: Secondary | ICD-10-CM | POA: Diagnosis not present

## 2024-06-22 DIAGNOSIS — J441 Chronic obstructive pulmonary disease with (acute) exacerbation: Secondary | ICD-10-CM | POA: Diagnosis not present

## 2024-06-22 DIAGNOSIS — F17210 Nicotine dependence, cigarettes, uncomplicated: Secondary | ICD-10-CM | POA: Diagnosis not present

## 2024-06-22 DIAGNOSIS — E042 Nontoxic multinodular goiter: Secondary | ICD-10-CM | POA: Diagnosis not present

## 2024-06-22 DIAGNOSIS — E785 Hyperlipidemia, unspecified: Secondary | ICD-10-CM | POA: Diagnosis not present

## 2024-06-22 DIAGNOSIS — E1122 Type 2 diabetes mellitus with diabetic chronic kidney disease: Secondary | ICD-10-CM | POA: Diagnosis not present

## 2024-06-22 DIAGNOSIS — R918 Other nonspecific abnormal finding of lung field: Secondary | ICD-10-CM | POA: Diagnosis not present

## 2024-06-22 DIAGNOSIS — C3490 Malignant neoplasm of unspecified part of unspecified bronchus or lung: Secondary | ICD-10-CM | POA: Diagnosis not present

## 2024-06-22 DIAGNOSIS — G720 Drug-induced myopathy: Secondary | ICD-10-CM | POA: Diagnosis not present

## 2024-06-25 ENCOUNTER — Other Ambulatory Visit: Payer: Self-pay | Admitting: *Deleted

## 2024-06-25 DIAGNOSIS — E1122 Type 2 diabetes mellitus with diabetic chronic kidney disease: Secondary | ICD-10-CM | POA: Diagnosis not present

## 2024-06-25 DIAGNOSIS — M5410 Radiculopathy, site unspecified: Secondary | ICD-10-CM | POA: Diagnosis not present

## 2024-06-25 DIAGNOSIS — R296 Repeated falls: Secondary | ICD-10-CM | POA: Diagnosis not present

## 2024-06-25 DIAGNOSIS — I2510 Atherosclerotic heart disease of native coronary artery without angina pectoris: Secondary | ICD-10-CM | POA: Diagnosis not present

## 2024-06-25 DIAGNOSIS — G8929 Other chronic pain: Secondary | ICD-10-CM | POA: Diagnosis not present

## 2024-06-25 DIAGNOSIS — R32 Unspecified urinary incontinence: Secondary | ICD-10-CM | POA: Diagnosis not present

## 2024-06-25 DIAGNOSIS — E785 Hyperlipidemia, unspecified: Secondary | ICD-10-CM | POA: Diagnosis not present

## 2024-06-25 DIAGNOSIS — J441 Chronic obstructive pulmonary disease with (acute) exacerbation: Secondary | ICD-10-CM | POA: Diagnosis not present

## 2024-06-25 DIAGNOSIS — F17210 Nicotine dependence, cigarettes, uncomplicated: Secondary | ICD-10-CM | POA: Diagnosis not present

## 2024-06-25 DIAGNOSIS — E042 Nontoxic multinodular goiter: Secondary | ICD-10-CM | POA: Diagnosis not present

## 2024-06-25 DIAGNOSIS — R918 Other nonspecific abnormal finding of lung field: Secondary | ICD-10-CM

## 2024-06-25 DIAGNOSIS — Z556 Problems related to health literacy: Secondary | ICD-10-CM | POA: Diagnosis not present

## 2024-06-25 DIAGNOSIS — C3490 Malignant neoplasm of unspecified part of unspecified bronchus or lung: Secondary | ICD-10-CM | POA: Diagnosis not present

## 2024-06-25 DIAGNOSIS — F1011 Alcohol abuse, in remission: Secondary | ICD-10-CM | POA: Diagnosis not present

## 2024-06-25 DIAGNOSIS — K219 Gastro-esophageal reflux disease without esophagitis: Secondary | ICD-10-CM | POA: Diagnosis not present

## 2024-06-25 DIAGNOSIS — M1990 Unspecified osteoarthritis, unspecified site: Secondary | ICD-10-CM | POA: Diagnosis not present

## 2024-06-25 DIAGNOSIS — F331 Major depressive disorder, recurrent, moderate: Secondary | ICD-10-CM | POA: Diagnosis not present

## 2024-06-25 DIAGNOSIS — G43009 Migraine without aura, not intractable, without status migrainosus: Secondary | ICD-10-CM | POA: Diagnosis not present

## 2024-06-25 DIAGNOSIS — N182 Chronic kidney disease, stage 2 (mild): Secondary | ICD-10-CM | POA: Diagnosis not present

## 2024-06-25 DIAGNOSIS — I69354 Hemiplegia and hemiparesis following cerebral infarction affecting left non-dominant side: Secondary | ICD-10-CM | POA: Diagnosis not present

## 2024-06-25 DIAGNOSIS — F410 Panic disorder [episodic paroxysmal anxiety] without agoraphobia: Secondary | ICD-10-CM | POA: Diagnosis not present

## 2024-06-25 DIAGNOSIS — G720 Drug-induced myopathy: Secondary | ICD-10-CM | POA: Diagnosis not present

## 2024-06-25 DIAGNOSIS — J432 Centrilobular emphysema: Secondary | ICD-10-CM | POA: Diagnosis not present

## 2024-06-25 DIAGNOSIS — I129 Hypertensive chronic kidney disease with stage 1 through stage 4 chronic kidney disease, or unspecified chronic kidney disease: Secondary | ICD-10-CM | POA: Diagnosis not present

## 2024-06-25 LAB — CUP PACEART REMOTE DEVICE CHECK
Date Time Interrogation Session: 20251121153915
Implantable Pulse Generator Implant Date: 20251016

## 2024-06-26 ENCOUNTER — Inpatient Hospital Stay: Attending: Internal Medicine | Admitting: Internal Medicine

## 2024-06-26 ENCOUNTER — Inpatient Hospital Stay

## 2024-06-26 VITALS — BP 144/79 | HR 95 | Temp 97.6°F | Resp 17 | Ht 64.0 in | Wt 182.0 lb

## 2024-06-26 DIAGNOSIS — Z8673 Personal history of transient ischemic attack (TIA), and cerebral infarction without residual deficits: Secondary | ICD-10-CM | POA: Diagnosis not present

## 2024-06-26 DIAGNOSIS — Z87891 Personal history of nicotine dependence: Secondary | ICD-10-CM

## 2024-06-26 DIAGNOSIS — F17210 Nicotine dependence, cigarettes, uncomplicated: Secondary | ICD-10-CM | POA: Diagnosis not present

## 2024-06-26 DIAGNOSIS — C3490 Malignant neoplasm of unspecified part of unspecified bronchus or lung: Secondary | ICD-10-CM

## 2024-06-26 DIAGNOSIS — E119 Type 2 diabetes mellitus without complications: Secondary | ICD-10-CM | POA: Diagnosis not present

## 2024-06-26 DIAGNOSIS — J4489 Other specified chronic obstructive pulmonary disease: Secondary | ICD-10-CM | POA: Diagnosis not present

## 2024-06-26 DIAGNOSIS — C3492 Malignant neoplasm of unspecified part of left bronchus or lung: Secondary | ICD-10-CM | POA: Diagnosis not present

## 2024-06-26 DIAGNOSIS — J449 Chronic obstructive pulmonary disease, unspecified: Secondary | ICD-10-CM | POA: Diagnosis not present

## 2024-06-26 DIAGNOSIS — R059 Cough, unspecified: Secondary | ICD-10-CM | POA: Diagnosis not present

## 2024-06-26 DIAGNOSIS — I10 Essential (primary) hypertension: Secondary | ICD-10-CM | POA: Diagnosis not present

## 2024-06-26 DIAGNOSIS — E7800 Pure hypercholesterolemia, unspecified: Secondary | ICD-10-CM | POA: Diagnosis not present

## 2024-06-26 DIAGNOSIS — R197 Diarrhea, unspecified: Secondary | ICD-10-CM | POA: Diagnosis not present

## 2024-06-26 DIAGNOSIS — R918 Other nonspecific abnormal finding of lung field: Secondary | ICD-10-CM

## 2024-06-26 LAB — CMP (CANCER CENTER ONLY)
ALT: 7 U/L (ref 0–44)
AST: 20 U/L (ref 15–41)
Albumin: 4.1 g/dL (ref 3.5–5.0)
Alkaline Phosphatase: 81 U/L (ref 38–126)
Anion gap: 10 (ref 5–15)
BUN: 10 mg/dL (ref 8–23)
CO2: 28 mmol/L (ref 22–32)
Calcium: 9.7 mg/dL (ref 8.9–10.3)
Chloride: 102 mmol/L (ref 98–111)
Creatinine: 0.78 mg/dL (ref 0.44–1.00)
GFR, Estimated: >60 mL/min (ref >60)
Glucose, Bld: 178 mg/dL — ABNORMAL HIGH (ref 70–99)
Potassium: 4.1 mmol/L (ref 3.5–5.1)
Sodium: 141 mmol/L (ref 135–145)
Total Bilirubin: 0.3 mg/dL (ref 0.0–1.2)
Total Protein: 6.9 g/dL (ref 6.5–8.1)

## 2024-06-26 LAB — CBC WITH DIFFERENTIAL (CANCER CENTER ONLY)
Abs Immature Granulocytes: 0.04 K/uL (ref 0.00–0.07)
Basophils Absolute: 0.1 K/uL (ref 0.0–0.1)
Basophils Relative: 1 %
Eosinophils Absolute: 0.2 K/uL (ref 0.0–0.5)
Eosinophils Relative: 2 %
HCT: 35.5 % — ABNORMAL LOW (ref 36.0–46.0)
Hemoglobin: 11.7 g/dL — ABNORMAL LOW (ref 12.0–15.0)
Immature Granulocytes: 0 %
Lymphocytes Relative: 21 %
Lymphs Abs: 1.9 K/uL (ref 0.7–4.0)
MCH: 31.2 pg (ref 26.0–34.0)
MCHC: 33 g/dL (ref 30.0–36.0)
MCV: 94.7 fL (ref 80.0–100.0)
Monocytes Absolute: 0.7 K/uL (ref 0.1–1.0)
Monocytes Relative: 8 %
Neutro Abs: 6.4 K/uL (ref 1.7–7.7)
Neutrophils Relative %: 68 %
Platelet Count: 226 K/uL (ref 150–400)
RBC: 3.75 MIL/uL — ABNORMAL LOW (ref 3.87–5.11)
RDW: 12.9 % (ref 11.5–15.5)
WBC Count: 9.3 K/uL (ref 4.0–10.5)
nRBC: 0 % (ref 0.0–0.2)

## 2024-06-26 NOTE — Progress Notes (Signed)
 Remote Loop Recorder Transmission

## 2024-06-26 NOTE — Progress Notes (Signed)
 [name] [phone]   [phone]  CONSULT NOTE  REFERRING PHYSICIAN: Dr. Lamar Chris  REASON FOR CONSULTATION:  73 years old white female diagnosed with lung cancer  HPI [name] is a 73 y.o. female.   HPI  Discussed the use of AI scribe software for clinical note transcription with the patient, who gave verbal consent to proceed.  History of Present Illness [name] is a 73 year old female with lung cancer who presents for oncology consultation.  She was diagnosed with lung cancer following a routine CT scan in August 2025, which revealed a nodule in the lingula of the left lung. The nodule was increasing in size compared to previous scans. A biopsy confirmed squamous cell carcinoma. Subsequent imaging, including a PET scan, showed the nodule to be hypermetabolic, measuring 1.6 cm, with no spread to lymph nodes or other areas.  In October 2025, she experienced a stroke, which has since improved, although she is still unable to drive. Initially considered for surgical removal of the lung nodule, surgery was not advisable due to the stroke, and radiation therapy was recommended instead.  Her medical history includes high blood pressure, high cholesterol, diabetes, depression, COPD, anxiety, and arthritis. She had colon cancer in 2006, treated with surgery and chemotherapy. She has a history of alcohol abuse but has been sober for over 40 years. She quit smoking after her stroke, having smoked for approximately 54 years.  Family history includes atrial fibrillation in her mother and sister, and her mother also had a stroke. Her father had prostate cancer. 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  She reports a persistent mild cough and intermittent diarrhea. No chest pain, nausea, or vomiting. She is not on oxygen  therapy and does not take breathing treatments at home.      Past Medical History:  Diagnosis Date   Alcohol abuse, in remission    Anxiety    Cancer of sigmoid colon (HCC) 07/2005   T2N0M0   Chronic bronchitis    COPD (chronic obstructive pulmonary disease) (HCC)    Depression    Diabetes mellitus without complication (HCC)    GERD (gastroesophageal reflux disease)    History of thyroid  nodule    monitoring   Hyperlipidemia    Hypertension    Migraines    OA (osteoarthritis)    PONV (postoperative nausea and vomiting)    Reflux       Past Surgical History:  Procedure Laterality Date   ABDOMINAL EXPOSURE N/A 11/24/2016   Procedure: ABDOMINAL EXPOSURE;  Surgeon: Krystal JULIANNA Doing, MD;  Location: [facility] OR;  Service: Vascular;  Laterality: N/A;   ABDOMINAL HYSTERECTOMY  1992   age 35 and appendics   ANTERIOR LUMBAR FUSION Bilateral 11/24/2016   Procedure: LUMBAR 5-SACRUM 1 ANTERIOR LUMBAR INTERBODY FUSION;  Surgeon: Oneil Priestly, MD;  Location: [facility] OR;  Service: Orthopedics;  Laterality: Bilateral;  LUMBAR 5-SACRUM 1 ANTERIOR LUMBAR INTERBODY FUSION    ARTHRODESIS METATARSALPHALANGEAL JOINT (MTPJ) Left 08/15/2023   Procedure: ARTHRODESIS METATARSALPHALANGEAL JOINT (MTPJ);  Surgeon: Tobie Franky SQUIBB, DPM;  Location: [facility] ORS;  Service: Orthopedics/Podiatry;  Laterality: Left;  POPLITEAL/SAPHENOUS BLOCK   bilateral breast surgery      benign tumors removed   CHOLECYSTECTOMY  2007   COLON RESECTION  2006   COLONOSCOPY  2019   HAND SURGERY  2010   LOOP RECORDER INSERTION N/A 05/17/2024  Procedure: LOOP RECORDER INSERTION;  Surgeon: Inocencio Soyla Lunger, MD;  Location: [facility];  Service: Cardiovascular;  Laterality: N/A;   repair of incisional hernia  2012   SHOULDER ARTHROSCOPY Left 2009   SHOULDER SURGERY Right 1997   rotator cuff   TONSILLECTOMY     as a child   TOTAL KNEE ARTHROPLASTY Left 08/11/2021   Procedure: TOTAL KNEE ARTHROPLASTY;  Surgeon: Beverley Evalene BIRCH, MD;  Location: [facility] ORS;  Service: Orthopedics;  Laterality:  Left;   TOTAL KNEE ARTHROPLASTY Right 06/29/2022   Procedure: TOTAL KNEE ARTHROPLASTY;  Surgeon: Beverley Evalene BIRCH, MD;  Location: [facility] ORS;  Service: Orthopedics;  Laterality: Right;   VIDEO BRONCHOSCOPY WITH ENDOBRONCHIAL NAVIGATION N/A 04/23/2024   Procedure: VIDEO BRONCHOSCOPY WITH ENDOBRONCHIAL NAVIGATION;  Surgeon: Shelah Lamar RAMAN, MD;  Location: [facility];  Service: Pulmonary;  Laterality: N/A;  lingular nodule    Family History  Problem Relation Age of Onset   Heart disease Mother        Previous MI at age 49-55   Atrial fibrillation Mother    Stroke Mother    Heart failure Mother    Seizures Father        poss d/t alcohol   Cancer Father 71       Prostate cancer    Diabetes Sister    Arrhythmia Sister    Cancer Maternal Aunt        lung cancer    Cancer Maternal Uncle        kidney ca    Social History Social History   Tobacco Use   Smoking status: Former    Current packs/day: 0.75    Average packs/day: 0.8 packs/day for 54.9 years (41.2 ttl pk-yrs)    Types: Cigarettes, E-cigarettes    Start date: 1971   Smokeless tobacco: Never   Tobacco comments:    7 cigerettes  a day, using e cigarette(very little), states she quit 05/12/24  Vaping Use   Vaping status: Some Days   Substances: Nicotine , Flavoring  Substance Use Topics   Alcohol use: No    Comment: recovering  alcoholic   Drug use: No    Allergies  Allergen Reactions   Bc Cherry [Aspirin -Caffeine] Anaphylaxis   Benadryl  [Diphenhydramine  Hcl] Nausea And Vomiting and Other (See Comments)    Migraine headaches.   Lansoprazole Nausea And Vomiting and Other (See Comments)    Migraine headache  Prevacid* due to dye in med    Morphine Sulfate Nausea And Vomiting and Other (See Comments)    Migraine headaches.   Red Dye #40 (Allura Red) Other (See Comments)    Also allergic to [facility] Dye-migraine headaches.   Simvastatin Nausea And Vomiting and Other (See Comments)    Migraine    Cephalexin     Other  Reaction(s): severe fatigue   Macrolides And Ketolides Nausea And Vomiting   Other     Brown dye,medicine with brown dye   Codeine Nausea And Vomiting   Penicillins Nausea And Vomiting and Other (See Comments)    INTOLERANCE >  NAUSEA & VOMITING YEAST INFECTION > CHANGE IN NORMAL FLORA   Shellfish Allergy Other (See Comments)    Congestion  Other Reaction(s): migraine   Sulfa Antibiotics Nausea And Vomiting    Current Outpatient Medications  Medication Sig Dispense Refill   albuterol  (VENTOLIN  HFA) 108 (90 Base) MCG/ACT inhaler Inhale 1-2 puffs into the lungs every 4 (four) hours as needed for wheezing or shortness of breath.  amitriptyline  (ELAVIL ) 25 MG tablet Take 100 mg by mouth at bedtime.     ARIPiprazole  (ABILIFY ) 20 MG tablet Take 20 mg by mouth daily.     calcium  carbonate (TUMS - DOSED IN MG ELEMENTAL CALCIUM ) 500 MG chewable tablet Chew 2 tablets by mouth 4 (four) times daily as needed for indigestion or heartburn.     Cholecalciferol  1.25 MG (50000 UT) capsule Take 50,000 Units by mouth every 7 (seven) days.     clopidogrel  (PLAVIX ) 75 MG tablet Take 1 tablet (75 mg total) by mouth daily. 30 tablet 3   famotidine  (PEPCID ) 20 MG tablet Take 1 tablet (20 mg total) by mouth 2 (two) times daily. 60 tablet 0   Fluticasone  Furoate (ARNUITY ELLIPTA ) 100 MCG/ACT AEPB Inhale 1 puff into the lungs daily. 30 each 0   gabapentin  (NEURONTIN ) 100 MG capsule Take 100 mg by mouth 2 (two) times daily.     metFORMIN  (GLUCOPHAGE -XR) 500 MG 24 hr tablet Take 1,000 mg by mouth 2 (two) times daily.     nicotine  (NICODERM CQ  - DOSED IN MG/24 HOURS) 14 mg/24hr patch Place 1 patch (14 mg total) onto the skin daily. 28 patch 0   rosuvastatin  (CRESTOR ) 5 MG tablet Take 5 mg by mouth daily.     No current facility-administered medications for this visit.    Review of Systems  Constitutional: positive for fatigue Eyes: negative Ears, nose, mouth, throat, and face: negative Respiratory:  negative Cardiovascular: negative Gastrointestinal: negative Genitourinary:negative Integument/breast: negative Hematologic/lymphatic: negative Musculoskeletal:negative Neurological: negative Behavioral/Psych: negative Endocrine: negative Allergic/Immunologic: negative  Physical Exam  MJO:jozmu, healthy, no distress, well nourished, and well developed SKIN: skin color, texture, turgor are normal, no rashes or significant lesions HEAD: Normocephalic, No masses, lesions, tenderness or abnormalities EYES: normal, PERRLA, Conjunctiva are pink and non-injected EARS: External ears normal, Canals clear OROPHARYNX:no exudate, no erythema, and lips, buccal mucosa, and tongue normal  NECK: supple, no adenopathy, no JVD LYMPH:  no palpable lymphadenopathy, no hepatosplenomegaly BREAST:not examined LUNGS: clear to auscultation , and palpation HEART: regular rate & rhythm, no murmurs, and no gallops ABDOMEN:abdomen soft, non-tender, normal bowel sounds, and no masses or organomegaly BACK: Back symmetric, no curvature., No CVA tenderness EXTREMITIES:no joint deformities, effusion, or inflammation, no edema  NEURO: alert & oriented x 3 with fluent speech, no focal motor/sensory deficits  PERFORMANCE STATUS: ECOG 1  LABORATORY DATA: Lab Results  Component Value Date   WBC 9.3 06/26/2024   HGB 11.7 (L) 06/26/2024   HCT 35.5 (L) 06/26/2024   MCV 94.7 06/26/2024   PLT 226 06/26/2024      Chemistry      Component Value Date/Time   NA 140 05/31/2024 0451   NA 139 09/10/2016 1443   K 3.6 05/31/2024 0451   K 3.7 09/10/2016 1443   CL 104 05/31/2024 0451   CO2 24 05/31/2024 0451   CO2 29 09/10/2016 1443   BUN 11 05/31/2024 0451   BUN 20.2 09/10/2016 1443   CREATININE 0.69 05/31/2024 0451   CREATININE 0.9 09/10/2016 1443      Component Value Date/Time   CALCIUM  8.7 (L) 05/31/2024 0451   CALCIUM  10.3 09/10/2016 1443   ALKPHOS 55 05/18/2024 0607   ALKPHOS 88 09/10/2016 1443   AST 23  05/18/2024 0607   AST 14 09/10/2016 1443   ALT 18 05/18/2024 0607   ALT 8 09/10/2016 1443   BILITOT 0.6 05/18/2024 0607   BILITOT 0.42 09/10/2016 1443       RADIOGRAPHIC STUDIES:  CUP PACEART REMOTE DEVICE CHECK Result Date: 06/25/2024 ILR summary report received. Battery status OK. Normal device function. No new symptom, tachy, brady episodes. No new AF episodes. 1 false pause due to undersensing. Monthly summary reports and ROV/PRN - CS, CVRS   ASSESSMENT: This is a very pleasant 73 years old white female diagnosed with stage Ia (T1b, N0, M0) disease non-small cell lung cancer, squamous cell carcinoma presented with lingular nodule diagnosed in September 2025.   PLAN: I personally independently reviewed her imaging studies as well as the pathology report and discussed the results with the patient today.  She is not a good surgical candidate because of her comorbidities and recent stroke. Assessment and Plan Assessment & Plan Stage IA squamous cell carcinoma of the left lung (lingula) Stage IA squamous cell carcinoma located in the lingula of the left lung. The nodule measures 1.6 cm and is hypermetabolic on PET scan. No lymph node or distant metastasis. Surgery is not recommended due to recent stroke. Radiation therapy with stereotactic body radiation therapy (SBRT) is planned as a curative approach. No chemotherapy, targeted therapy, or immunotherapy is needed due to early stage. - Will initiate radiation therapy with SBRT starting Monday. - Will arrange follow-up scan in four months to assess treatment response. - If follow-up scan is clear, will schedule subsequent scans every six months for two years, then annually for five years.  History of stroke Stroke occurred in October, impacting her ability to drive. Recovery is ongoing, but she is not currently on oxygen  and has undergone pulmonary function tests deemed adequate for surgery.  Chronic obstructive pulmonary disease  (COPD) COPD with ongoing symptoms including a mild cough. Pulmonary function tests have been conducted, and she is not on home oxygen  therapy. Smoking cessation was achieved following the stroke. - Continue smoking cessation efforts. The patient was advised to call immediately if she has any concerning symptoms in the interval. The patient voices understanding of current disease status and treatment options and is in agreement with the current care plan.  All questions were answered. The patient knows to call the clinic with any problems, questions or concerns. We can certainly see the patient much sooner if necessary.  Thank you so much for allowing me to participate in the care of [name]. I will continue to follow up the patient with you and assist in her care.  The total time spent in the appointment was 60 minutes including review of chart and various tests results, discussions about plan of care and coordination of care plan .   Disclaimer: This note was dictated with voice recognition software. Similar sounding words can inadvertently be transcribed and may not be corrected upon review.   Sherrod MARLA Sherrod June 26, 2024, 2:13 PM

## 2024-06-27 ENCOUNTER — Ambulatory Visit: Payer: Self-pay | Admitting: Cardiology

## 2024-06-27 DIAGNOSIS — C3412 Malignant neoplasm of upper lobe, left bronchus or lung: Secondary | ICD-10-CM | POA: Diagnosis not present

## 2024-06-29 DIAGNOSIS — I69354 Hemiplegia and hemiparesis following cerebral infarction affecting left non-dominant side: Secondary | ICD-10-CM | POA: Diagnosis not present

## 2024-06-29 DIAGNOSIS — J439 Emphysema, unspecified: Secondary | ICD-10-CM | POA: Diagnosis not present

## 2024-06-29 DIAGNOSIS — C3490 Malignant neoplasm of unspecified part of unspecified bronchus or lung: Secondary | ICD-10-CM | POA: Diagnosis not present

## 2024-06-29 DIAGNOSIS — Z9181 History of falling: Secondary | ICD-10-CM | POA: Diagnosis not present

## 2024-07-02 ENCOUNTER — Encounter: Payer: Self-pay | Admitting: Cardiology

## 2024-07-02 ENCOUNTER — Ambulatory Visit
Admission: RE | Admit: 2024-07-02 | Discharge: 2024-07-02 | Disposition: A | Source: Ambulatory Visit | Attending: Radiation Oncology | Admitting: Radiation Oncology

## 2024-07-02 ENCOUNTER — Other Ambulatory Visit: Payer: Self-pay

## 2024-07-02 DIAGNOSIS — C3412 Malignant neoplasm of upper lobe, left bronchus or lung: Secondary | ICD-10-CM | POA: Diagnosis present

## 2024-07-02 LAB — RAD ONC ARIA SESSION SUMMARY
Course Elapsed Days: 0
Plan Fractions Treated to Date: 1
Plan Prescribed Dose Per Fraction: 11 Gy
Plan Total Fractions Prescribed: 5
Plan Total Prescribed Dose: 55 Gy
Reference Point Dosage Given to Date: 11 Gy
Reference Point Session Dosage Given: 11 Gy
Session Number: 1

## 2024-07-02 NOTE — Progress Notes (Signed)
 Dyana Norse, LPN  P Cv Div Heartcare Device Notice of Potential Device Damage  Patient Name: [name]  Date of Birth:  1950/11/09  Device Information:  Loop-recorder:  Medtronic LINQ II  Programmable hepatic pump\  Intrathecal pain pump:  Neurostimulator:   The patient listed above is scheduled to receive radiation therapy, which may adversely affect the device listed above. Every reasonable effort will be made to minimize the radiation dose to the device, including exposure to neutrons. Though unlikely, possible damage to the battery and electronic components, such as memory storage and programming, is possible.  Your office may consider preserving valuable patient monitoring information stored on the device prior to the start of radiation therapy and checking the device programming after the patient completes radiation therapy.   TO BE COMPLETED BY RADIATION ONCOLOGIST'S OFFICE  Scheduled start date of radiation therapy: 07/02/24  Scheduled end date of radiation therapy: 07/524  Site to be treated: Left Lung  Radiation Oncologist:  Dr. Estefana Cha   TO BE COMPLETED BY RECIPIENT'S OFFICE  **By signing you are indicating that you've received this notice and no further action is needed:  Physician's signature and date:______________________________________________________  **Please call [phone] if you have questions for Radiation Oncologist and are unable to sign form  Fax form back to [phone]

## 2024-07-02 NOTE — Progress Notes (Signed)
 Signed and faxed back to [phone]. Confirmation received.

## 2024-07-03 ENCOUNTER — Ambulatory Visit
Admission: RE | Admit: 2024-07-03 | Discharge: 2024-07-03 | Disposition: A | Source: Ambulatory Visit | Attending: Radiation Oncology | Admitting: Radiation Oncology

## 2024-07-03 ENCOUNTER — Other Ambulatory Visit: Payer: Self-pay

## 2024-07-03 DIAGNOSIS — C3412 Malignant neoplasm of upper lobe, left bronchus or lung: Secondary | ICD-10-CM | POA: Diagnosis not present

## 2024-07-03 LAB — RAD ONC ARIA SESSION SUMMARY
Course Elapsed Days: 1
Plan Fractions Treated to Date: 2
Plan Prescribed Dose Per Fraction: 11 Gy
Plan Total Fractions Prescribed: 5
Plan Total Prescribed Dose: 55 Gy
Reference Point Dosage Given to Date: 22 Gy
Reference Point Session Dosage Given: 11 Gy
Session Number: 2

## 2024-07-04 ENCOUNTER — Ambulatory Visit
Admission: RE | Admit: 2024-07-04 | Discharge: 2024-07-04 | Disposition: A | Source: Ambulatory Visit | Attending: Radiation Oncology | Admitting: Radiation Oncology

## 2024-07-04 ENCOUNTER — Telehealth: Payer: Self-pay | Admitting: Radiation Oncology

## 2024-07-04 ENCOUNTER — Other Ambulatory Visit: Payer: Self-pay

## 2024-07-04 ENCOUNTER — Ambulatory Visit
Admission: RE | Admit: 2024-07-04 | Discharge: 2024-07-04 | Disposition: A | Source: Ambulatory Visit | Attending: Radiation Oncology

## 2024-07-04 DIAGNOSIS — C3412 Malignant neoplasm of upper lobe, left bronchus or lung: Secondary | ICD-10-CM | POA: Diagnosis not present

## 2024-07-04 LAB — RAD ONC ARIA SESSION SUMMARY
Course Elapsed Days: 2
Plan Fractions Treated to Date: 3
Plan Prescribed Dose Per Fraction: 11 Gy
Plan Total Fractions Prescribed: 5
Plan Total Prescribed Dose: 55 Gy
Reference Point Dosage Given to Date: 33 Gy
Reference Point Session Dosage Given: 11 Gy
Session Number: 3

## 2024-07-04 NOTE — Telephone Encounter (Signed)
 LVM to schedule 1 mo f/u with Dr. Maritza 1/5@1 :00pm. Left best c/b number in case pt needs to r/s.

## 2024-07-05 ENCOUNTER — Other Ambulatory Visit: Payer: Self-pay

## 2024-07-05 ENCOUNTER — Ambulatory Visit
Admission: RE | Admit: 2024-07-05 | Discharge: 2024-07-05 | Disposition: A | Source: Ambulatory Visit | Attending: Radiation Oncology | Admitting: Radiation Oncology

## 2024-07-05 DIAGNOSIS — C3412 Malignant neoplasm of upper lobe, left bronchus or lung: Secondary | ICD-10-CM | POA: Diagnosis not present

## 2024-07-05 LAB — RAD ONC ARIA SESSION SUMMARY
Course Elapsed Days: 3
Plan Fractions Treated to Date: 4
Plan Prescribed Dose Per Fraction: 11 Gy
Plan Total Fractions Prescribed: 5
Plan Total Prescribed Dose: 55 Gy
Reference Point Dosage Given to Date: 44 Gy
Reference Point Session Dosage Given: 11 Gy
Session Number: 4

## 2024-07-06 ENCOUNTER — Ambulatory Visit
Admission: RE | Admit: 2024-07-06 | Discharge: 2024-07-06 | Disposition: A | Source: Ambulatory Visit | Attending: Radiation Oncology | Admitting: Radiation Oncology

## 2024-07-06 ENCOUNTER — Other Ambulatory Visit: Payer: Self-pay

## 2024-07-06 DIAGNOSIS — Z510 Encounter for antineoplastic radiation therapy: Secondary | ICD-10-CM | POA: Diagnosis not present

## 2024-07-06 DIAGNOSIS — C3412 Malignant neoplasm of upper lobe, left bronchus or lung: Secondary | ICD-10-CM | POA: Diagnosis not present

## 2024-07-06 LAB — RAD ONC ARIA SESSION SUMMARY
Course Elapsed Days: 4
Plan Fractions Treated to Date: 5
Plan Prescribed Dose Per Fraction: 11 Gy
Plan Total Fractions Prescribed: 5
Plan Total Prescribed Dose: 55 Gy
Reference Point Dosage Given to Date: 55 Gy
Reference Point Session Dosage Given: 11 Gy
Session Number: 5

## 2024-07-10 NOTE — Radiation Completion Notes (Signed)
 Patient Name: [name], DAMBROSIO MRN: [id number] Date of Birth: [birth year] Referring Physician: LAMAR CHRIS, M.D. Date of Service: 2024-07-10 Radiation Oncologist: Estefana Cha, M.D. [facility]                             RADIATION ONCOLOGY END OF TREATMENT NOTE     Diagnosis: C34.12 Malignant neoplasm of upper lobe, left bronchus or lung Staging on 2024-06-26: Malignant neoplasm of lung (HCC) T=cT1b, N=cN0, M=cM0 Intent: Curative     ==========DELIVERED PLANS==========  First Treatment Date: 2024-07-02 Last Treatment Date: 2024-07-06   Plan Name: Lung_L_SBRT Site: Lung, Left Technique: SBRT/SRT-IMRT Mode: Photon Dose Per Fraction: 11 Gy Prescribed Dose (Delivered / Prescribed): 55 Gy / 55 Gy Prescribed Fxs (Delivered / Prescribed): 5 / 5     ==========ON TREATMENT VISIT DATES========== 2024-07-02, 2024-07-03, 2024-07-04, 2024-07-04, 2024-07-05, 2024-07-06     ==========UPCOMING VISITS========== 09/23/2024 [facility] REMOTE PACER CK CVD HVT DEVICE REMOTES  08/23/2024 [facility] REMOTE PACER CK CVD HVT DEVICE REMOTES  08/08/2024 [facility] FU Gregg Lek, MD  08/06/2024 [facility] ONC FOLLOW UP 20 Cha Estefana, MD  07/23/2024 [facility] REMOTE PACER CK CVD HVT DEVICE REMOTES  07/12/2024 [facility] FU Kirsteins, Prentice BRAVO, MD        ==========APPENDIX - ON TREATMENT VISIT NOTES==========   See weekly On Treatment Notes in Epic for details in the Media tab (listed as Progress notes on the On Treatment Visit Dates listed above).

## 2024-07-12 ENCOUNTER — Encounter: Admitting: Physical Medicine & Rehabilitation

## 2024-07-12 ENCOUNTER — Encounter: Payer: Self-pay | Admitting: Physical Medicine & Rehabilitation

## 2024-07-12 VITALS — BP 143/84 | HR 100 | Ht 64.0 in | Wt 177.2 lb

## 2024-07-12 DIAGNOSIS — I69398 Other sequelae of cerebral infarction: Secondary | ICD-10-CM | POA: Diagnosis not present

## 2024-07-12 DIAGNOSIS — R269 Unspecified abnormalities of gait and mobility: Secondary | ICD-10-CM | POA: Diagnosis not present

## 2024-07-12 NOTE — Progress Notes (Signed)
 Subjective:    Patient ID: [name], female    DOB: [birth year], 73 y.o.   MRN: [id number] Admit date: 05/17/2024 Discharge date: 05/31/2024 73 y.o. female with past medical history of tobacco abuse, squamous cell carcinoma of the lung (newly diagnosed 04/2024), COPD, coronary artery calcification, depression, type 2 diabetes, hypertension, migraines who presented to [facility] emergency department on 05/11/2024, with a sudden onset of shortness of breath. The patient had previously used BC powder for a headache and believed she was experiencing an allergic reaction. EMS administered Solu-Medrol , two rounds of epinephrine , and Zofran . In the emergency department, she was tachypneic but maintained oxygen  saturation at 96% on six liters of oxygen . A code stroke was activated due to left neglect and hemiparesis. She was not eligible for TNK treatment as she was outside the treatment window. ED labs showed a D-dimer of 7.84, ABG with a pH of 7.31, pCO2 of 49, and pO2 of 88. A CT angiogram was negative for pulmonary embolism, and a CT head was also negative. CTA of the head and neck indicated a large vessel occlusion, but CT perfusion showed no evidence of ischemia.  Neurology was consulted and a stroke workup was completed. An MRI confirmed infarcts in the right MCA, PCA, and ACA territories. Recommendations were made for a loop recorder to rule out a. Fib, she was placed on daily Plavix , as she is allergic to aspirin . The MRI did not reveal any brain metastases. The echo showed an ejection fraction of 65%, and her HbA1c was 7.4. Her hospital course was complicated by acute kidney injury, but she received IV fluid hydration and had mild hypokalemia, which was treated with potassium replacement. Prior to arrival, the patient was independent, driving, and did not use mobility devices. She resides in a two-level home with her significant other and currently requires moderate assistance with a roller walker for  functional mobility and transfers. Therapy evaluations completed due to patient decreased functional mobility was admitted for a comprehensive rehab program HPI Discussed the use of AI scribe software for clinical note transcription with the patient, who gave verbal consent to proceed.  History of Present Illness [name] is a 73 year old female who presents for follow-up after completing home health therapy.  She has completed home health therapy, including physical and occupational therapy, and is considering transitioning to outpatient therapy for more comprehensive care. She uses a cane at home and on the porch, and brought a walker to the appointment due to uncertainty about walking distances. She has been relying on others for transportation and is planning to practice driving to regain her independence. She uses an automatic transmission vehicle and has not experienced any falls at home. She is able to dress and bathe herself independently, utilizing a handicap accessible shower.  Regarding her respiratory status, she uses an inhaler once a day. Her housemate noted an increase in coughing since returning home, but she does not experience significant shortness of breath with activities such as walking to the car.  She recently completed a course of radiation therapy for lung cancer last Friday. She will see her oncologist in a month for a scan. She recalls being told that the cancer was small and detected early.  She resides in [place], near the [place] area, and has a support system that assists with transportation.    Pain Inventory Average Pain 10 Pain Right Now 10 My pain is N/A  LOCATION OF PAIN  N/A  BOWEL Number of stools per week: 3-5 Oral laxative use No  Type of laxative No Enema or suppository use No  History of colostomy No  Incontinent No   BLADDER Normal In and out cath, frequency No Able to self cath N/A Bladder incontinence No  Frequent  urination No  Leakage with coughing Yes  Difficulty starting stream No  Incomplete bladder emptying No    Mobility use a cane use a walker ability to climb steps?  yes do you drive?  yes  Function what is your job?  Semi-Retired Manufacturing Systems Engineer retired  Neuro/Psych No problems in this area depression anxiety  Prior Studies Any changes since last visit?  no  Physicians involved in your care Primary care Anthony Hint, FNP   Family History  Problem Relation Age of Onset   Heart disease Mother        Previous MI at age 59-55   Atrial fibrillation Mother    Stroke Mother    Heart failure Mother    Seizures Father        poss d/t alcohol   Cancer Father 75       Prostate cancer    Diabetes Sister    Arrhythmia Sister    Cancer Maternal Aunt        lung cancer    Cancer Maternal Uncle        kidney ca   Social History   Socioeconomic History   Marital status: Media Planner    Spouse name: Not on file   Number of children: 0   Years of education: Not on file   Highest education level: Some college, no degree  Occupational History   Occupation: pet care  Tobacco Use   Smoking status: Former    Current packs/day: 0.75    Average packs/day: 0.8 packs/day for 54.9 years (41.2 ttl pk-yrs)    Types: Cigarettes, E-cigarettes    Start date: 1971   Smokeless tobacco: Never   Tobacco comments:    7 cigerettes  a day, using e cigarette(very little), states she quit 05/12/24  Vaping Use   Vaping status: Some Days   Substances: Nicotine , Flavoring  Substance and Sexual Activity   Alcohol use: No    Comment: recovering  alcoholic   Drug use: No   Sexual activity: Not on file  Other Topics Concern   Not on file  Social History Narrative   Caffeine 3 cups per day    Lives with Partner Dawn   Social Drivers of Health   Tobacco Use: Medium Risk (07/12/2024)   Patient History    Smoking Tobacco Use: Former    Smokeless Tobacco Use: Never    Passive Exposure: Not  on Actuary Strain: Not on file  Food Insecurity: No Food Insecurity (05/12/2024)   Epic    Worried About Programme Researcher, Broadcasting/film/video in the Last Year: Never true    Ran Out of Food in the Last Year: Never true  Transportation Needs: No Transportation Needs (05/12/2024)   Epic    Lack of Transportation (Medical): No    Lack of Transportation (Non-Medical): No  Physical Activity: Not on file  Stress: Not on file  Social Connections: Moderately Isolated (05/16/2024)   Social Connection and Isolation Panel    Frequency of Communication with Friends and Family: More than three times a week    Frequency of Social Gatherings with Friends and Family: Once a week    Attends Religious Services: Never  Active Member of Clubs or Organizations: No    Attends Banker Meetings: Never    Marital Status: Living with partner  Depression (PHQ2-9): Low Risk (07/12/2024)   Depression (PHQ2-9)    PHQ-2 Score: 3  Alcohol Screen: Not on file  Housing: Low Risk (05/16/2024)   Epic    Unable to Pay for Housing in the Last Year: No    Number of Times Moved in the Last Year: 0    Homeless in the Last Year: No  Utilities: Not At Risk (05/12/2024)   Epic    Threatened with loss of utilities: No  Health Literacy: Not on file   Past Surgical History:  Procedure Laterality Date   ABDOMINAL EXPOSURE N/A 11/24/2016   Procedure: ABDOMINAL EXPOSURE;  Surgeon: Krystal JULIANNA Doing, MD;  Location: [facility] OR;  Service: Vascular;  Laterality: N/A;   ABDOMINAL HYSTERECTOMY  1992   age 53 and appendics   ANTERIOR LUMBAR FUSION Bilateral 11/24/2016   Procedure: LUMBAR 5-SACRUM 1 ANTERIOR LUMBAR INTERBODY FUSION;  Surgeon: Oneil Priestly, MD;  Location: [facility] OR;  Service: Orthopedics;  Laterality: Bilateral;  LUMBAR 5-SACRUM 1 ANTERIOR LUMBAR INTERBODY FUSION    ARTHRODESIS METATARSALPHALANGEAL JOINT (MTPJ) Left 08/15/2023   Procedure: ARTHRODESIS METATARSALPHALANGEAL JOINT (MTPJ);  Surgeon: Tobie Franky SQUIBB,  DPM;  Location: [facility] ORS;  Service: Orthopedics/Podiatry;  Laterality: Left;  POPLITEAL/SAPHENOUS BLOCK   bilateral breast surgery      benign tumors removed   CHOLECYSTECTOMY  2007   COLON RESECTION  2006   COLONOSCOPY  2019   HAND SURGERY  2010   LOOP RECORDER INSERTION N/A 05/17/2024   Procedure: LOOP RECORDER INSERTION;  Surgeon: Inocencio Soyla Lunger, MD;  Location: [facility] INVASIVE CV LAB;  Service: Cardiovascular;  Laterality: N/A;   repair of incisional hernia  2012   SHOULDER ARTHROSCOPY Left 2009   SHOULDER SURGERY Right 1997   rotator cuff   TONSILLECTOMY     as a child   TOTAL KNEE ARTHROPLASTY Left 08/11/2021   Procedure: TOTAL KNEE ARTHROPLASTY;  Surgeon: Beverley Evalene BIRCH, MD;  Location: [facility] ORS;  Service: Orthopedics;  Laterality: Left;   TOTAL KNEE ARTHROPLASTY Right 06/29/2022   Procedure: TOTAL KNEE ARTHROPLASTY;  Surgeon: Beverley Evalene BIRCH, MD;  Location: [facility] ORS;  Service: Orthopedics;  Laterality: Right;   VIDEO BRONCHOSCOPY WITH ENDOBRONCHIAL NAVIGATION N/A 04/23/2024   Procedure: VIDEO BRONCHOSCOPY WITH ENDOBRONCHIAL NAVIGATION;  Surgeon: Shelah Lamar RAMAN, MD;  Location: [facility] ENDOSCOPY;  Service: Pulmonary;  Laterality: N/A;  lingular nodule   Past Medical History:  Diagnosis Date   Alcohol abuse, in remission    Anxiety    Cancer of sigmoid colon (HCC) 07/2005   T2N0M0   Chronic bronchitis    COPD (chronic obstructive pulmonary disease) (HCC)    Depression    Diabetes mellitus without complication (HCC)    GERD (gastroesophageal reflux disease)    History of thyroid  nodule    monitoring   Hyperlipidemia    Hypertension    Migraines    OA (osteoarthritis)    PONV (postoperative nausea and vomiting)    Reflux    BP (!) 143/84 (BP Location: Left Arm, Patient Position: Sitting)   Pulse 100   Ht 5' 4 (1.626 m)   Wt 177 lb 3.2 oz (80.4 kg)   SpO2 94%   BMI 30.42 kg/m   Opioid Risk Score:   Fall Risk Score:  `1  Depression screen [facility] 2/9     07/12/2024     1:53  PM 06/26/2024    2:21 PM 06/26/2024    2:18 PM  Depression screen PHQ 2/9  Decreased Interest 1 1 1   Down, Depressed, Hopeless 1 1 1   PHQ - 2 Score 2 2 2   Altered sleeping 0  1  Tired, decreased energy 1  1  Change in appetite 0  0  Feeling bad or failure about yourself  0  0  Trouble concentrating 0  0  Moving slowly or fidgety/restless 0  0  Suicidal thoughts 0  0  PHQ-9 Score 3  4  Difficult doing work/chores Not difficult at all        Review of Systems  Gastrointestinal:  Positive for diarrhea.  Musculoskeletal:  Positive for gait problem.  Neurological:        H/O stroke September 2025  Hematological:  Bruises/bleeds easily.  Psychiatric/Behavioral:         Anxiety and depression  All other systems reviewed and are negative.      Objective:   Physical Exam Vitals and nursing note reviewed.  Constitutional:      Appearance: She is obese.  HENT:     Head: Normocephalic and atraumatic.  Eyes:     Extraocular Movements: Extraocular movements intact.     Conjunctiva/sclera: Conjunctivae normal.     Pupils: Pupils are equal, round, and reactive to light.  Musculoskeletal:     Right lower leg: No edema.     Left lower leg: No edema.  Skin:    General: Skin is warm and dry.  Neurological:     Mental Status: She is alert and oriented to person, place, and time.  Psychiatric:        Mood and Affect: Mood normal.        Behavior: Behavior normal.   Motor strength is 5/5 bilateral deltoid, bicep, tricep, grip 4+/5 bilateral hip flexor knee extensor ankle dorsiflexor  Sensation intact bilateral upper and lower limbs to light touch No evidence of dysmetria finger-nose-finger testing No evidence of fine motor dysfunction with finger to thumb opposition No evidence of dysdiadochokinesis rapid alternating supination pronation of bilateral extremities Speech without dysarthria or aphasia Extraocular muscles intact Visual fields are intact confrontation  testing      Assessment & Plan:   Assessment and Plan Assessment & Plan Gait disturbance and functional deficits secondary to cerebral infarction Gait and functional deficits persist post-cerebral infarction. Transitioning to outpatient therapy. No falls. Independent in ADLs. No vision or arm issues. Breathing stable with inhaler. - Referred to outpatient physical therapy at neuro rehab on [address] near [place]. - Instructed to practice driving in an empty parking lot with a licensed driver, progressing to quiet and busy streets as confidence improves. - Advised against interstate and nighttime driving for the first month. - Provided form for handicap parking sticker for DMV.

## 2024-07-12 NOTE — Patient Instructions (Addendum)
 Graduated return to driving instructions were provided. It is recommended that the patient first drives with another licensed driver in an empty parking lot. If the patient does well with this, and they can drive on a quiet street with the licensed driver. If the patient does well with this they can drive on a busy street with a licensed driver. If the patient does well with this, the next time out they can go by himself. For the first month after resuming driving, I recommend no nighttime or Interstate driving.    VISIT SUMMARY: You came in for a follow-up after completing home health therapy. We discussed your progress and plans for transitioning to outpatient therapy, as well as your respiratory status and recent cancer treatment.  YOUR PLAN: GAIT DISTURBANCE AND FUNCTIONAL DEFICITS: You have ongoing issues with walking and functional abilities following a stroke. -You are being referred to outpatient physical therapy at the neuro rehab center on [address] near [employer]. -Practice driving in an empty parking lot with a licensed driver, and gradually move to quiet and then busy streets as you feel more confident. -Avoid driving on the interstate and at night for the first month. -You have been given a form for a handicap parking sticker to take to the DMV.                       Contains text generated by [employer]bridge.                                 Contains text generated by [employer]bridge.

## 2024-07-23 ENCOUNTER — Ambulatory Visit

## 2024-07-23 DIAGNOSIS — I639 Cerebral infarction, unspecified: Secondary | ICD-10-CM | POA: Diagnosis not present

## 2024-07-24 LAB — CUP PACEART REMOTE DEVICE CHECK
Date Time Interrogation Session: 20251222153919
Implantable Pulse Generator Implant Date: 20251016

## 2024-07-25 NOTE — Progress Notes (Signed)
 Remote Loop Recorder Transmission

## 2024-07-27 ENCOUNTER — Ambulatory Visit: Payer: Self-pay | Admitting: Cardiology

## 2024-08-06 ENCOUNTER — Ambulatory Visit
Admission: RE | Admit: 2024-08-06 | Discharge: 2024-08-06 | Disposition: A | Discharge status: Home / Self Care | Source: Ambulatory Visit | Attending: Radiation Oncology | Admitting: Radiation Oncology

## 2024-08-06 ENCOUNTER — Encounter: Payer: Self-pay | Admitting: Radiation Oncology

## 2024-08-06 VITALS — BP 168/97 | HR 98 | Temp 97.3°F | Resp 20 | Ht 64.0 in | Wt 178.2 lb

## 2024-08-06 DIAGNOSIS — Z79899 Other long term (current) drug therapy: Secondary | ICD-10-CM | POA: Insufficient documentation

## 2024-08-06 DIAGNOSIS — Z7902 Long term (current) use of antithrombotics/antiplatelets: Secondary | ICD-10-CM | POA: Insufficient documentation

## 2024-08-06 DIAGNOSIS — C3432 Malignant neoplasm of lower lobe, left bronchus or lung: Secondary | ICD-10-CM

## 2024-08-06 DIAGNOSIS — Z923 Personal history of irradiation: Secondary | ICD-10-CM | POA: Insufficient documentation

## 2024-08-06 DIAGNOSIS — Z7984 Long term (current) use of oral hypoglycemic drugs: Secondary | ICD-10-CM | POA: Diagnosis not present

## 2024-08-06 DIAGNOSIS — C3412 Malignant neoplasm of upper lobe, left bronchus or lung: Secondary | ICD-10-CM | POA: Insufficient documentation

## 2024-08-06 NOTE — Progress Notes (Signed)
 "  Radiation Oncology         [phone] ________________________________  Name: PALMYRA ROGACKI MRN: [id number]  Date: 08/06/2024  DOB: 1950/09/22  Follow-Up Visit Note   CC: [name], FNP  [name], FNP  Diagnosis:     ICD-10-CM   1. Malignant neoplasm of lower lobe of left lung (HCC)  C34.32       Interval Since Last Radiation:  1 month  Narrative:  The patient returns today for routine follow-up.  She reports doing well with radiation. Denies chest pain, new cough, new SOB, hemoptysis and trouble swallowing. Does report some fatigue since treatment but it was minor and is improving. She is improving slowly from her stroke as well. She has had improvement in her mobility but it is not as much as she'd hoped. She still relies on a cane. She had fu w/ her PM&R doctor in mid December and she has been referred to outpatient physical therapy.                             ALLERGIES:  is allergic to bc cherry [aspirin -caffeine], benadryl  [diphenhydramine  hcl], lansoprazole, morphine sulfate, red dye #40 (allura red), simvastatin, cephalexin, macrolides and ketolides, other, codeine, penicillins, shellfish allergy, and sulfa antibiotics.  Meds: Current Outpatient Medications  Medication Sig Dispense Refill   albuterol  (VENTOLIN  HFA) 108 (90 Base) MCG/ACT inhaler Inhale 1-2 puffs into the lungs every 4 (four) hours as needed for wheezing or shortness of breath.     amitriptyline  (ELAVIL ) 25 MG tablet Take 100 mg by mouth at bedtime.     ARIPiprazole  (ABILIFY ) 20 MG tablet Take 20 mg by mouth daily.     calcium  carbonate (TUMS - DOSED IN MG ELEMENTAL CALCIUM ) 500 MG chewable tablet Chew 2 tablets by mouth 4 (four) times daily as needed for indigestion or heartburn.     Cholecalciferol  1.25 MG (50000 UT) capsule Take 50,000 Units by mouth every 7 (seven) days.     clopidogrel  (PLAVIX ) 75 MG tablet Take 1 tablet (75 mg total) by mouth daily. 30 tablet 3   famotidine  (PEPCID ) 20 MG  tablet Take 1 tablet (20 mg total) by mouth 2 (two) times daily. 60 tablet 0   Fluticasone  Furoate (ARNUITY ELLIPTA ) 100 MCG/ACT AEPB Inhale 1 puff into the lungs daily. 30 each 0   gabapentin  (NEURONTIN ) 100 MG capsule Take 100 mg by mouth 2 (two) times daily.     metFORMIN  (GLUCOPHAGE -XR) 500 MG 24 hr tablet Take 1,000 mg by mouth 2 (two) times daily.     nicotine  (NICODERM CQ  - DOSED IN MG/24 HOURS) 14 mg/24hr patch Place 1 patch (14 mg total) onto the skin daily. 28 patch 0   rosuvastatin  (CRESTOR ) 5 MG tablet Take 5 mg by mouth daily.     No current facility-administered medications for this encounter.    Physical Findings: The patient is in no acute distress. Patient is alert and oriented.  height is 5' 4 (1.626 m) and weight is 178 lb 3.2 oz (80.8 kg). Her temperature is 97.3 F (36.3 C) (abnormal). Her blood pressure is 168/97 (abnormal) and her pulse is 98. Her respiration is 20 and oxygen  saturation is 95%. .  No significant changes. Ambulates with a cane.  Lab Findings: Lab Results  Component Value Date   WBC 9.3 06/26/2024   HGB 11.7 (L) 06/26/2024   HCT 35.5 (L) 06/26/2024   MCV 94.7  06/26/2024   PLT 226 06/26/2024    Radiographic Findings: CUP PACEART REMOTE DEVICE CHECK Result Date: 07/24/2024 ILR summary report received. Battery status OK. Normal device function. No new symptom, tachy, brady, or pause episodes. No new AF episodes. Monthly summary reports and ROV/PRN LA, CVRS   Impression:  The patient is recovering from the effects of radiation. Experienced mild fatigue which is improving.  Plan:  CT Chest WO in 2 months  Total time spent today in preparation for this visit was 30 minutes. This included patient care, imaging and path review, documentation, multidisciplinary discussion and coordination of care and follow up.    Estefana HERO. Maritza, M.D.    "

## 2024-08-06 NOTE — Progress Notes (Addendum)
 [name] is here today for follow up post radiation to the left lung. She completed radiation on 07-06-24.  Lung Side: Left Lung.   Does the patient complain of any of the following: Pain:Denies Shortness of breath w/wo exertion: Denies Cough: Denies Hemoptysis: Denies Pain with swallowing: Denies Swallowing/choking concerns: Denies Appetite: Good Energy Level: Low Post radiation skin Changes: Denies    Additional comments if applicable:None at this time.   BP (!) 165/99 (BP Location: Right Arm, Patient Position: Sitting, Cuff Size: Large)   Pulse 98   Temp (!) 97.3 F (36.3 C)   Resp 20   Ht 5' 4 (1.626 m)   Wt 178 lb 3.2 oz (80.8 kg)   SpO2 95%   BMI 30.59 kg/m

## 2024-08-07 ENCOUNTER — Ambulatory Visit: Attending: Physical Medicine & Rehabilitation | Admitting: Physical Therapy

## 2024-08-07 VITALS — BP 159/97 | HR 80

## 2024-08-07 DIAGNOSIS — M6281 Muscle weakness (generalized): Secondary | ICD-10-CM | POA: Insufficient documentation

## 2024-08-07 DIAGNOSIS — R269 Unspecified abnormalities of gait and mobility: Secondary | ICD-10-CM | POA: Insufficient documentation

## 2024-08-07 DIAGNOSIS — R2689 Other abnormalities of gait and mobility: Secondary | ICD-10-CM | POA: Diagnosis present

## 2024-08-07 DIAGNOSIS — I69398 Other sequelae of cerebral infarction: Secondary | ICD-10-CM | POA: Diagnosis not present

## 2024-08-07 DIAGNOSIS — R278 Other lack of coordination: Secondary | ICD-10-CM | POA: Diagnosis present

## 2024-08-07 DIAGNOSIS — I63411 Cerebral infarction due to embolism of right middle cerebral artery: Secondary | ICD-10-CM | POA: Insufficient documentation

## 2024-08-07 DIAGNOSIS — R2681 Unsteadiness on feet: Secondary | ICD-10-CM | POA: Insufficient documentation

## 2024-08-07 DIAGNOSIS — R296 Repeated falls: Secondary | ICD-10-CM | POA: Insufficient documentation

## 2024-08-07 NOTE — Therapy (Signed)
 " OUTPATIENT PHYSICAL THERAPY NEURO EVALUATION   Patient Name: [name] MRN: [id number] DOB:[birth year], 74 y.o., female Today's Date: 08/07/2024   PCP: Dyane Laurette RAMAN, FNP REFERRING PROVIDER: Carilyn Prentice BRAVO, MD  END OF SESSION:  PT End of Session - 08/07/24 1444     Visit Number 1    Number of Visits 17   with eval   Date for Recertification  10/16/24   to allow for scheduling delays   Authorization Type [employer] Medicare    PT Start Time 1443    PT Stop Time 1525    PT Time Calculation (min) 42 min    Equipment Utilized During Treatment Gait belt    Activity Tolerance Patient tolerated treatment well    Behavior During Therapy WFL for tasks assessed/performed          Past Medical History:  Diagnosis Date   Alcohol abuse, in remission    Anxiety    Cancer of sigmoid colon (HCC) 07/2005   T2N0M0   Chronic bronchitis    COPD (chronic obstructive pulmonary disease) (HCC)    Depression    Diabetes mellitus without complication (HCC)    GERD (gastroesophageal reflux disease)    History of thyroid  nodule    monitoring   Hyperlipidemia    Hypertension    Migraines    OA (osteoarthritis)    PONV (postoperative nausea and vomiting)    Reflux    Past Surgical History:  Procedure Laterality Date   ABDOMINAL EXPOSURE N/A 11/24/2016   Procedure: ABDOMINAL EXPOSURE;  Surgeon: Krystal JULIANNA Doing, MD;  Location: [facility] OR;  Service: Vascular;  Laterality: N/A;   ABDOMINAL HYSTERECTOMY  1992   age 7 and appendics   ANTERIOR LUMBAR FUSION Bilateral 11/24/2016   Procedure: LUMBAR 5-SACRUM 1 ANTERIOR LUMBAR INTERBODY FUSION;  Surgeon: Oneil Priestly, MD;  Location: [facility] OR;  Service: Orthopedics;  Laterality: Bilateral;  LUMBAR 5-SACRUM 1 ANTERIOR LUMBAR INTERBODY FUSION    ARTHRODESIS METATARSALPHALANGEAL JOINT (MTPJ) Left 08/15/2023   Procedure: ARTHRODESIS METATARSALPHALANGEAL JOINT (MTPJ);  Surgeon: Tobie Franky SQUIBB, DPM;  Location: [facility] ORS;  Service: Orthopedics/Podiatry;   Laterality: Left;  POPLITEAL/SAPHENOUS BLOCK   bilateral breast surgery      benign tumors removed   CHOLECYSTECTOMY  2007   COLON RESECTION  2006   COLONOSCOPY  2019   HAND SURGERY  2010   LOOP RECORDER INSERTION N/A 05/17/2024   Procedure: LOOP RECORDER INSERTION;  Surgeon: Inocencio Soyla Lunger, MD;  Location: [facility] INVASIVE CV LAB;  Service: Cardiovascular;  Laterality: N/A;   repair of incisional hernia  2012   SHOULDER ARTHROSCOPY Left 2009   SHOULDER SURGERY Right 1997   rotator cuff   TONSILLECTOMY     as a child   TOTAL KNEE ARTHROPLASTY Left 08/11/2021   Procedure: TOTAL KNEE ARTHROPLASTY;  Surgeon: Beverley Evalene JONETTA, MD;  Location: [facility] ORS;  Service: Orthopedics;  Laterality: Left;   TOTAL KNEE ARTHROPLASTY Right 06/29/2022   Procedure: TOTAL KNEE ARTHROPLASTY;  Surgeon: Beverley Evalene JONETTA, MD;  Location: [facility] ORS;  Service: Orthopedics;  Laterality: Right;   VIDEO BRONCHOSCOPY WITH ENDOBRONCHIAL NAVIGATION N/A 04/23/2024   Procedure: VIDEO BRONCHOSCOPY WITH ENDOBRONCHIAL NAVIGATION;  Surgeon: Shelah Lamar RAMAN, MD;  Location: [facility] ENDOSCOPY;  Service: Pulmonary;  Laterality: N/A;  lingular nodule   Patient Active Problem List   Diagnosis Date Noted   Depression with anxiety 05/21/2024   CVA (cerebral vascular accident) (HCC) 05/17/2024   Acute CVA (cerebrovascular accident) (HCC) 05/12/2024   Gastroesophageal reflux  disease 05/11/2024   Drug-induced myopathy 05/11/2024   Diabetic foot ulcer (HCC) 05/11/2024   Benign essential hypertension 05/11/2024   Major depressive disorder, recurrent, moderate (HCC) 05/11/2024   Malignant neoplasm of lung (HCC) 05/11/2024   Thyroid  nodule 05/11/2024   Panic disorder 05/11/2024   Migraine without aura and responsive to treatment 05/11/2024   Recurrent falls 05/11/2024   Stage 2 chronic kidney disease 05/11/2024   Poorly controlled type 2 diabetes mellitus (HCC) 05/11/2024   Acute hypoxemic respiratory failure (HCC) 05/11/2024   Acute  left-sided weakness 05/11/2024   AKI (acute kidney injury) 05/11/2024   Hypokalemia 05/11/2024   Pulmonary nodule 1 cm or greater in diameter 04/23/2024   Lung nodules 04/11/2024   S/P total knee arthroplasty, right 06/29/2022   Multinodular goiter 06/11/2021   Recurrent ventral incisional hernia 06/11/2021   Centrilobular emphysema (HCC) 12/13/2020   Radiculopathy 11/24/2016   H/O colon cancer, stage I 01/19/2013   Nonspecific abnormal electrocardiogram (ECG) (EKG) 03/31/2011   Chest pain 03/31/2011   Hyperlipidemia 03/31/2011   Essential hypertension 03/31/2011   Tobacco abuse 03/31/2011    ONSET DATE: 07/12/2024 (referral date); stroke 05/12/2024  REFERRING DIAG: I69.398,R26.9 (ICD-10-CM) - Gait disturbance, post-stroke  THERAPY DIAG:  Muscle weakness (generalized)  Other abnormalities of gait and mobility  Unsteadiness on feet  Other lack of coordination  Repeated falls  Cerebrovascular accident (CVA) due to embolism of right middle cerebral artery (HCC)  Rationale for Evaluation and Treatment: Rehabilitation  SUBJECTIVE:                                                                                                                                                                                             SUBJECTIVE STATEMENT: Pt was d/c from CIR on 05/31/24 after having a CVA on 05/12/24. Since being home from the hospital she did have some HHPT and HHOT but reports that the HHPT was not very helpful and she didn't get many visits, her HHOT was helpful though. She feels like she has regressed due to this but has been working on some of the exercises that she got from her therapists in the hospital.  Pt has completed her radiation for right now for her lung cancer, will get another scan in 2 months to determine if she needs more.  Pt enters clinic with SPC, reports she was using a RW up until 2 weeks ago. She reports she does have difficulty walking longer distances  or standing for an extended period of time. 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 Her partner mostly needs help with getting in/out of the car,  does not need assist with ADLs. She gets help from her partner to thread her L arm into her shirts.  She also reports she has had 2 falls since being home but was able to get back up herself. Pt denies any injuries with her falls. Pt does feel like she doesn't have full strength in her L hand, does not carry a full drink with this hand. She declines any OT for right now but will keep it in mind.   Pt accompanied by: self, drives herself  PERTINENT HISTORY: PMH: tobacco abuse, squamous cell carcinoma of the lung (newly diagnosed 04/2024), coronary artery calcification, migraines, COPD, chronic bronchitis, colon cancer, hypertension, hyperlipidemia, type 2 diabetes, thyroid  nodule, arthritis, migraines, anxiety, depression, and recent stroke.  PAIN:  Are you having pain? No  PRECAUTIONS: Fall and Other: loop recorder  RED FLAGS: None   WEIGHT BEARING RESTRICTIONS: No  FALLS: Has patient fallen in last 6 months? Yes. Number of falls 2 falls since October, was able to get back up herself, no injuries  LIVING ENVIRONMENT: Lives with: lives with their partner; she is caregiver for her partner - MS Lives in: House/apartment Stairs: Yes: Internal: 14 steps; on right going up (except for a short distance across a platform - is working on getting a rail installed here) Has following equipment at home: Single point cane and Environmental Consultant - 2 wheeled  PLOF: Independent with gait, Independent with transfers, and Requires assistive device for independence  PATIENT GOALS: to get a lot more strength in my legs, I felt imbalanced prior to the stroke get my balance back so I don't have to use a cane  OBJECTIVE:  Note: Objective measures were completed at Evaluation unless otherwise noted.  DIAGNOSTIC FINDINGS:  Brain MRI  05/12/2024 IMPRESSION: 1. Widely scattered small Acute infarcts in the right MCA and right MCA/PCA watershed territories. 2. Cytotoxic edema and scattered acute petechial hemorrhage (right parietal and occipital). No malignant hemorrhagic transformation. No intracranial mass effect. 3. Underlying chronic white matter disease.  COGNITION: Overall cognitive status: has some trouble with word-finding at times   SENSATION: N/T in L leg down into foot up to about her knee Mild sensory impairments in RLE per pt report  COORDINATION: Impaired in L hemibody  EDEMA:  Mild in [name]   POSTURE: rounded shoulders, forward head, and posterior pelvic tilt  LOWER EXTREMITY ROM:   WFL  Active  Right Eval Left Eval  Hip flexion    Hip extension    Hip abduction    Hip adduction    Hip internal rotation    Hip external rotation    Knee flexion    Knee extension    Ankle dorsiflexion    Ankle plantarflexion    Ankle inversion    Ankle eversion     (Blank rows = not tested)  LOWER EXTREMITY MMT:    MMT Right Eval Left Eval  Hip flexion 4+ 3+  Hip extension    Hip abduction    Hip adduction    Hip internal rotation    Hip external rotation    Knee flexion 4+ 3+  Knee extension 4+ 3+  Ankle dorsiflexion 4+ 4  Ankle plantarflexion    Ankle inversion    Ankle eversion    (Blank rows = not tested)  BED MOBILITY:  Sleeping in chair currently as her bed is upstairs; may have to get a bedrail when she gets back to her bed, her bed is tall off the floor  TRANSFERS: Sit to stand: Modified independence  Assistive device utilized: None     Stand to sit: Modified independence  Assistive device utilized: None     Chair to chair: Modified independence  Assistive device utilized: None       STAIRS: STAIRS:  Level of Assistance: SBA  Stair Negotiation Technique: Step to Pattern with Bilateral Rails  Number of Stairs: 6   Height of Stairs: 4  Comments: step to gait pattern, slow  and cautious  GAIT: Findings:  Gait pattern: path deviation - weaving and deviation to the L, decreased hip/knee flexion- Left, and decreased ankle dorsiflexion- Left Distance walked: various clinic distances Assistive device utilized: Single point cane Level of assistance: SBA   FUNCTIONAL TESTS:    [facility] PT Assessment - 08/07/24 1507       Ambulation/Gait   Gait velocity 32.8 ft over 14.34 sec = 2.29 ft/sec      Standardized Balance Assessment   Standardized Balance Assessment Timed Up and Go Test;Five Times Sit to Stand    Five times sit to stand comments  14.9 sec   no UE, some posterior LOB     Timed Up and Go Test   TUG Normal TUG    Normal TUG (seconds) 9.69   no AD     Functional Gait  Assessment   Gait assessed  Yes    Gait Level Surface Walks 20 ft, slow speed, abnormal gait pattern, evidence for imbalance or deviates 10-15 in outside of the 12 in walkway width. Requires more than 7 sec to ambulate 20 ft.    Change in Gait Speed Able to change speed, demonstrates mild gait deviations, deviates 6-10 in outside of the 12 in walkway width, or no gait deviations, unable to achieve a major change in velocity, or uses a change in velocity, or uses an assistive device.    Gait with Horizontal Head Turns Performs head turns with moderate changes in gait velocity, slows down, deviates 10-15 in outside 12 in walkway width but recovers, can continue to walk.    Gait with Vertical Head Turns Performs task with slight change in gait velocity (eg, minor disruption to smooth gait path), deviates 6 - 10 in outside 12 in walkway width or uses assistive device    Gait and Pivot Turn Pivot turns safely in greater than 3 sec and stops with no loss of balance, or pivot turns safely within 3 sec and stops with mild imbalance, requires small steps to catch balance.    Step Over Obstacle Is able to step over one shoe box (4.5 in total height) without changing gait speed. No evidence of imbalance.     Gait with Narrow Base of Support Ambulates less than 4 steps heel to toe or cannot perform without assistance.    Gait with Eyes Closed Walks 20 ft, slow speed, abnormal gait pattern, evidence for imbalance, deviates 10-15 in outside 12 in walkway width. Requires more than 9 sec to ambulate 20 ft.    Ambulating Backwards Walks 20 ft, uses assistive device, slower speed, mild gait deviations, deviates 6-10 in outside 12 in walkway width.    Steps Two feet to a stair, must use rail.    Total Score 14    FGA comment: 14/30, high fall risk  TREATMENT DATE:  PT Evaluation  Self-Care/Home Management Vitals:   08/07/24 1459 08/07/24 1551  BP: (!) 164/104 (!) 159/97  Pulse: 84 80   Assessed in RUE while seated at rest. Initial reading significantly elevated. Reassessed after seated 2 min break. Second reading is still elevated but within safe limits to proceed with PT eval. Educated pt on BP management and cutoff of 180/100 for safe participation in therapy session. Pt believes that her BP medication was stopped when she was in the hospital and was not re-prescribed. Pt going to ask her neurologist at her appt tomorrow about it and also her PCP when she sees them later this week.   PATIENT EDUCATION: Education details: Eval findings, PT POC, results of OM and functional implications, see above regarding BP Person educated: Patient Education method: Medical Illustrator Education comprehension: verbalized understanding, returned demonstration, and needs further education  HOME EXERCISE PROGRAM: To be reviewed from CIR and addended as needed  GOALS: Goals reviewed with patient? Yes  SHORT TERM GOALS: Target date: 09/04/2024   Pt will be independent with initial HEP for improved strength, balance, transfers and gait. Baseline: Goal status:  INITIAL  2.  Pt will improve gait velocity to at least 2.5 ft/sec for improved gait efficiency and performance at SBA level  Baseline: 2.29 ft/sec SBA no AD (1/6) Goal status: INITIAL  3.  Pt will improve FGA to 18/30 for decreased fall risk  Baseline: 14/30 (1/6) Goal status: INITIAL  4.  Pt will be able to navigate up/down a flight of stairs with one handrail at mod I level with step-to gait pattern Baseline:  Goal status: INITIAL   LONG TERM GOALS: Target date: 10/02/2024   Pt will be independent with final HEP for improved strength, balance, transfers and gait. Baseline:  Goal status: INITIAL  2.  Pt will improve gait velocity to at least 2.75 ft/sec for improved gait efficiency and performance at mod I level  Baseline: 2.29 ft/sec SBA no AD (1/6) Goal status: INITIAL  3.  Pt will improve FGA to 22/30 for decreased fall risk  Baseline: 14/30 (1/6) Goal status: INITIAL  4.  Pt will be able to navigate up/down a flight of stairs with one handrail at mod I level with alternating gait pattern Baseline:  Goal status: INITIAL  5.  mCTSIB to be assessed and LTG set Baseline:  Goal status: INITIAL    ASSESSMENT:  CLINICAL IMPRESSION: Patient is a 74 year old female referred to Neuro OPPT for CVA.   Pt's PMH is significant for: tobacco abuse, squamous cell carcinoma of the lung (newly diagnosed 04/2024), coronary artery calcification, migraines, COPD, chronic bronchitis, colon cancer, hypertension, hyperlipidemia, type 2 diabetes, thyroid  nodule, arthritis, migraines, anxiety, depression, and recent stroke. The following deficits were present during the exam: decreased BLE strength, sensory impairments in [name], balance impairments, hypertension, mild cog impairments, and potential visual impairments. Based on her gait speed of 2.29 ft/sec, FGA score of 14/30 sec, fall history, and sensory and strength impairments, pt is an increased risk for falls. Pt would benefit from skilled PT  to address these impairments and functional limitations to maximize functional mobility independence.   OBJECTIVE IMPAIRMENTS: Abnormal gait, decreased activity tolerance, decreased balance, decreased cognition, decreased coordination, decreased endurance, difficulty walking, decreased strength, impaired sensation, impaired UE functional use, impaired vision/preception, and postural dysfunction.   ACTIVITY LIMITATIONS: carrying, lifting, bending, squatting, stairs, bed mobility, and caring for others  PARTICIPATION LIMITATIONS: cleaning, laundry, and community activity  PERSONAL  FACTORS: Fitness and 3+ comorbidities:   tobacco abuse, squamous cell carcinoma of the lung (newly diagnosed 04/2024), coronary artery calcification, migraines, COPD, chronic bronchitis, colon cancer, hypertension, hyperlipidemia, type 2 diabetes, thyroid  nodule, arthritis, migraines, anxiety, depression, and recent stroke.are also affecting patient's functional outcome.   REHAB POTENTIAL: Good  CLINICAL DECISION MAKING: Evolving/moderate complexity  EVALUATION COMPLEXITY: Moderate  PLAN:  PT FREQUENCY: 2x/week  PT DURATION: 8 weeks  PLANNED INTERVENTIONS: [id number]- PT Re-evaluation, 97750- Physical Performance Testing, 97110-Therapeutic exercises, 97530- Therapeutic activity, 97112- Neuromuscular re-education, 97535- Self Care, [id number]- Manual therapy, [phone]- Gait training, [phone]- Orthotic Initial, [phone]- Orthotic/Prosthetic subsequent, Patient/Family education, Balance training, Stair training, Taping, Joint mobilization, Visual/preceptual remediation/compensation, Cognitive remediation, DME instructions, Cryotherapy, and Moist heat  PLAN FOR NEXT SESSION: CHECK BP, assess visual impairments - L neglect?, assess mCTSIB and set LTG (pt reports LOB with EC), review CIR HEP and addend/upgrade as needed to work on [name] NMR/strengthening and balance impairments   Waddell Southgate, PT Waddell Southgate, PT, DPT,  CSRS  08/07/2024, 3:52 PM        "

## 2024-08-08 ENCOUNTER — Encounter: Payer: Self-pay | Admitting: Neurology

## 2024-08-08 ENCOUNTER — Ambulatory Visit: Admitting: Neurology

## 2024-08-08 VITALS — BP 171/72 | HR 63 | Ht 64.0 in | Wt 181.0 lb

## 2024-08-08 DIAGNOSIS — I63411 Cerebral infarction due to embolism of right middle cerebral artery: Secondary | ICD-10-CM | POA: Diagnosis not present

## 2024-08-08 NOTE — Patient Instructions (Addendum)
 Continue current medications including Plavix  and Crestor  Follow-up with PCP tomorrow to discuss reinitiation of blood pressure medication Continue your other medications Continue with physical therapy as scheduled Continue follow-up PCP Return as needed

## 2024-08-08 NOTE — Progress Notes (Signed)
 "  [facility]  PATIENT: [name] DOB: 1950-11-26  REFERRING CLINICIAN: Jerri Pfeiffer, MD HISTORY FROM: Patient  REASON FOR VISIT: Multiple falls.   HISTORICAL  CHIEF COMPLAINT:  Chief Complaint  Patient presents with   Cerebrovascular Accident    Rm 13 alone Pt is well, reports residual L sided weakness. Also mentions migraines due to not taking BC powder since stroke. No other concerns.     INTERVAL HISTORY 08/09/2023 Discussed the use of AI scribe software for clinical note transcription with the patient, who gave verbal consent to proceed.  [name] is a 74 year old female with a history of hypertension, diabetes, anxiety/depression who presents after a recent admission for right hemispheric stroke.  Stroke etiology is likely large vessel disease but cannot rule out cardioembolic.  Patient completed acute rehab, and is pending outpatient PT that supposed start next week.  Following her stroke, she was discharged on Plavix  and statin and also has a loop recorder so far no evidence of atrial fibrillation but patient has reported family history of atrial fibrillation  She has been undergoing physical therapy at home since her hospital discharge in October, although there has been inconsistency with the sessions. She is scheduled to start a new therapy program next week.  Post-stroke, she experiences hearing loss in her left ear, which she describes as not a significant issue. She continues to have numbness and weakness in her left leg, describing it as feeling like 'a brick tied to it.' Her left hand and arm have good movement, although she avoids lifting weights with them.  A loop recorder was implanted after her initial workup showed minimal atherosclerosis in her neck or head vessels. Her family history includes atrial fibrillation in her mother and sister, although she has not been diagnosed with any heart problems.  Her diabetes markers were slightly  elevated with an A1c of 7.4, but her cholesterol levels were within normal limits. She quit smoking after her stroke and is currently using a nicotine  patch.  She was previously on blood pressure medication, specifically valsartan and hydrochlorothiazide, but noticed it was discontinued around her hospital stay. She is concerned about her elevated blood pressure readings since then.    INTERVAL HISTORY 12/17/21:  Patient presents today for follow-up, at last visit plan was to start gabapentin  for neuropathy.  She reported gabapentin  helps with the neuropathy but she ran out of medication.  In January she did have total knee replacement on the left but currently her right knee is flaring up.  She completed PT and is doing PT at home. No other complaints.   INTERNAL HISTORY 06/18/2021  Patient presented for follow-up, last visit was on August 19, at that time plan was to get neuropathy labs, EEG for further evaluation.  EEG completed, did not show any epileptiform discharge, no seizures.  Her neuropathy labs including hemoglobin A1c, TSH and vitamin B12 were all within normal limits; her vitamin D  level was low but she is on vitamin D  supplement.  Patient reports since last visit she had 2 near falls and they both occurred after sudden turn, she lost her equilibrium but was able to catch herself.  No other injuries.  She does complain of pain in the lower extremities associated with her neuropathy and left knee pain which she is scheduled to have surgery in January.  She does not use assistive device for ambulation pain.   HISTORY OF PRESENT ILLNESS:  This is a 74 year old woman  with past medical history of hypertension hyperlipidemia, anxiety depression, colon cancer treated with surgery who is presented after multiple falls for the past year.  Patient stated in the past this is a having a total of 4 falls of unclear mechanism.  She mentioned for one fall, she remembers tripping and falling but the  other 3 falls she did not know how it happened other than the fact that she woke up from the ground with injuries.  She denies any warning sign prior to the fall, denies any lightheadedness, no change in vision and no abnormal heartbeat prior to the fall.  Her last one was on August 6 when she had left forehead laceration requiring multiple stitches.  At that time a head CT was done no evidence of intracranial bleeding.  She also reports urinary incontinence with 1 event.  Patient stated she lives alone no one had witnessed the falls.  She reported no confusion after falls.  She reports following up with a cardiologist had and was told that everything was normal including an echocardiogram of the heart.   Patient also reports a balance problem, she cannot stand on one foot, reports that when shutting eye, she sway, report some numbness in the legs.   OTHER MEDICAL CONDITIONS: No reported heart disease, HTN, HLD, Anxiety/Depression, history of cancer treated with surgery    REVIEW OF SYSTEMS: Full 14 system review of systems performed and negative with exception of: as noted in the HPI  ALLERGIES: Allergies  Allergen Reactions   Bc Cherry [Aspirin -Caffeine] Anaphylaxis   Benadryl  [Diphenhydramine  Hcl] Nausea And Vomiting and Other (See Comments)    Migraine headaches.   Lansoprazole Nausea And Vomiting and Other (See Comments)    Migraine headache  Prevacid* due to dye in med    Morphine Sulfate Nausea And Vomiting and Other (See Comments)    Migraine headaches.   Red Dye #40 (Allura Red) Other (See Comments)    Also allergic to [facility] Dye-migraine headaches.   Simvastatin Nausea And Vomiting and Other (See Comments)    Migraine    Cephalexin     Other Reaction(s): severe fatigue   Macrolides And Ketolides Nausea And Vomiting   Other     Brown dye,medicine with brown dye   Codeine Nausea And Vomiting   Penicillins Nausea And Vomiting and Other (See Comments)    INTOLERANCE >  NAUSEA &  VOMITING YEAST INFECTION > CHANGE IN NORMAL FLORA   Shellfish Allergy Other (See Comments)    Congestion  Other Reaction(s): migraine   Sulfa Antibiotics Nausea And Vomiting    HOME MEDICATIONS: Outpatient Medications Prior to Visit  Medication Sig Dispense Refill   albuterol  (VENTOLIN  HFA) 108 (90 Base) MCG/ACT inhaler Inhale 1-2 puffs into the lungs every 4 (four) hours as needed for wheezing or shortness of breath.     amitriptyline  (ELAVIL ) 25 MG tablet Take 100 mg by mouth at bedtime.     ARIPiprazole  (ABILIFY ) 20 MG tablet Take 20 mg by mouth daily.     calcium  carbonate (TUMS - DOSED IN MG ELEMENTAL CALCIUM ) 500 MG chewable tablet Chew 2 tablets by mouth 4 (four) times daily as needed for indigestion or heartburn.     clopidogrel  (PLAVIX ) 75 MG tablet Take 1 tablet (75 mg total) by mouth daily. 30 tablet 3   famotidine  (PEPCID ) 20 MG tablet Take 1 tablet (20 mg total) by mouth 2 (two) times daily. 60 tablet 0   gabapentin  (NEURONTIN ) 100 MG capsule Take 100 mg  by mouth 2 (two) times daily.     metFORMIN  (GLUCOPHAGE -XR) 500 MG 24 hr tablet Take 1,000 mg by mouth 2 (two) times daily.     nicotine  (NICODERM CQ  - DOSED IN MG/24 HOURS) 14 mg/24hr patch Place 1 patch (14 mg total) onto the skin daily. 28 patch 0   rosuvastatin  (CRESTOR ) 5 MG tablet Take 5 mg by mouth daily.     Cholecalciferol  1.25 MG (50000 UT) capsule Take 50,000 Units by mouth every 7 (seven) days.     Fluticasone  Furoate (ARNUITY ELLIPTA ) 100 MCG/ACT AEPB Inhale 1 puff into the lungs daily. 30 each 0   No facility-administered medications prior to visit.    PAST MEDICAL HISTORY: Past Medical History:  Diagnosis Date   Alcohol abuse, in remission    Anxiety    Cancer of sigmoid colon (HCC) 07/2005   T2N0M0   Chronic bronchitis    COPD (chronic obstructive pulmonary disease) (HCC)    Depression    Diabetes mellitus without complication (HCC)    GERD (gastroesophageal reflux disease)    History of thyroid   nodule    monitoring   Hyperlipidemia    Hypertension    Migraines    OA (osteoarthritis)    PONV (postoperative nausea and vomiting)    Reflux     PAST SURGICAL HISTORY: Past Surgical History:  Procedure Laterality Date   ABDOMINAL EXPOSURE N/A 11/24/2016   Procedure: ABDOMINAL EXPOSURE;  Surgeon: Krystal JULIANNA Doing, MD;  Location: [facility] OR;  Service: Vascular;  Laterality: N/A;   ABDOMINAL HYSTERECTOMY  1992   age 31 and appendics   ANTERIOR LUMBAR FUSION Bilateral 11/24/2016   Procedure: LUMBAR 5-SACRUM 1 ANTERIOR LUMBAR INTERBODY FUSION;  Surgeon: Oneil Priestly, MD;  Location: [facility] OR;  Service: Orthopedics;  Laterality: Bilateral;  LUMBAR 5-SACRUM 1 ANTERIOR LUMBAR INTERBODY FUSION    ARTHRODESIS METATARSALPHALANGEAL JOINT (MTPJ) Left 08/15/2023   Procedure: ARTHRODESIS METATARSALPHALANGEAL JOINT (MTPJ);  Surgeon: Tobie Franky SQUIBB, DPM;  Location: [facility] ORS;  Service: Orthopedics/Podiatry;  Laterality: Left;  POPLITEAL/SAPHENOUS BLOCK   bilateral breast surgery      benign tumors removed   CHOLECYSTECTOMY  2007   COLON RESECTION  2006   COLONOSCOPY  2019   HAND SURGERY  2010   LOOP RECORDER INSERTION N/A 05/17/2024   Procedure: LOOP RECORDER INSERTION;  Surgeon: Inocencio Soyla Lunger, MD;  Location: [facility] INVASIVE CV LAB;  Service: Cardiovascular;  Laterality: N/A;   repair of incisional hernia  2012   SHOULDER ARTHROSCOPY Left 2009   SHOULDER SURGERY Right 1997   rotator cuff   TONSILLECTOMY     as a child   TOTAL KNEE ARTHROPLASTY Left 08/11/2021   Procedure: TOTAL KNEE ARTHROPLASTY;  Surgeon: Beverley Evalene BIRCH, MD;  Location: [facility] ORS;  Service: Orthopedics;  Laterality: Left;   TOTAL KNEE ARTHROPLASTY Right 06/29/2022   Procedure: TOTAL KNEE ARTHROPLASTY;  Surgeon: Beverley Evalene BIRCH, MD;  Location: [facility] ORS;  Service: Orthopedics;  Laterality: Right;   VIDEO BRONCHOSCOPY WITH ENDOBRONCHIAL NAVIGATION N/A 04/23/2024   Procedure: VIDEO BRONCHOSCOPY WITH ENDOBRONCHIAL NAVIGATION;  Surgeon:  Shelah Lamar RAMAN, MD;  Location: [facility] ENDOSCOPY;  Service: Pulmonary;  Laterality: N/A;  lingular nodule    FAMILY HISTORY: Family History  Problem Relation Age of Onset   Heart disease Mother        Previous MI at age 85-55   Atrial fibrillation Mother    Stroke Mother    Heart failure Mother    Seizures Father  poss d/t alcohol   Cancer Father 28       Prostate cancer    Diabetes Sister    Arrhythmia Sister    Cancer Maternal Aunt        lung cancer    Cancer Maternal Uncle        kidney ca    SOCIAL HISTORY: Social History   Socioeconomic History   Marital status: Media Planner    Spouse name: Not on file   Number of children: 0   Years of education: Not on file   Highest education level: Some college, no degree  Occupational History   Occupation: pet care  Tobacco Use   Smoking status: Former    Current packs/day: 0.75    Average packs/day: 0.8 packs/day for 55.0 years (41.3 ttl pk-yrs)    Types: Cigarettes, E-cigarettes    Start date: 1971   Smokeless tobacco: Never   Tobacco comments:    7 cigerettes  a day, using e cigarette(very little), states she quit 05/12/24  Vaping Use   Vaping status: Some Days   Substances: Nicotine , Flavoring  Substance and Sexual Activity   Alcohol use: No    Comment: recovering  alcoholic   Drug use: No   Sexual activity: Not on file  Other Topics Concern   Not on file  Social History Narrative   Caffeine 3 cups per day    Lives with Partner Dawn   Social Drivers of Health   Tobacco Use: Medium Risk (08/08/2024)   Patient History    Smoking Tobacco Use: Former    Smokeless Tobacco Use: Never    Passive Exposure: Not on Actuary Strain: Not on file  Food Insecurity: No Food Insecurity (05/12/2024)   Epic    Worried About Programme Researcher, Broadcasting/film/video in the Last Year: Never true    Ran Out of Food in the Last Year: Never true  Transportation Needs: No Transportation Needs (05/12/2024)   Epic    Lack of  Transportation (Medical): No    Lack of Transportation (Non-Medical): No  Physical Activity: Not on file  Stress: Not on file  Social Connections: Moderately Isolated (05/16/2024)   Social Connection and Isolation Panel    Frequency of Communication with Friends and Family: More than three times a week    Frequency of Social Gatherings with Friends and Family: Once a week    Attends Religious Services: Never    Database Administrator or Organizations: No    Attends Banker Meetings: Never    Marital Status: Living with partner  Intimate Partner Violence: Not At Risk (05/12/2024)   Epic    Fear of Current or Ex-Partner: No    Emotionally Abused: No    Physically Abused: No    Sexually Abused: No  Depression (PHQ2-9): Low Risk (07/12/2024)   Depression (PHQ2-9)    PHQ-2 Score: 3  Alcohol Screen: Not on file  Housing: Low Risk (05/16/2024)   Epic    Unable to Pay for Housing in the Last Year: No    Number of Times Moved in the Last Year: 0    Homeless in the Last Year: No  Utilities: Not At Risk (05/12/2024)   Epic    Threatened with loss of utilities: No  Health Literacy: Not on file    PHYSICAL EXAM GENERAL EXAM/CONSTITUTIONAL: Vitals:  Vitals:   08/08/24 1434 08/08/24 1441  BP: (!) 175/119 (!) 171/72  Pulse: 63 63  Weight: 181  lb (82.1 kg)   Height: 5' 4 (1.626 m)    Body mass index is 31.07 kg/m. Wt Readings from Last 3 Encounters:  08/08/24 181 lb (82.1 kg)  08/06/24 178 lb 3.2 oz (80.8 kg)  07/12/24 177 lb 3.2 oz (80.4 kg)   Patient is in no distress; well developed, nourished and groomed; neck is supple  MUSCULOSKELETAL: Gait, strength, tone, movements noted in Neurologic exam below  NEUROLOGIC: MENTAL STATUS:  awake, alert, oriented to person, place and time recent and remote memory intact normal attention and concentration language fluent, comprehension intact, naming intact fund of knowledge appropriate  CRANIAL NERVE:  2nd, 3rd,  4th, 6th - visual fields full to confrontation, extraocular muscles intact, no nystagmus 5th - facial sensation symmetric 7th - facial strength symmetric 8th - hearing intact 9th - palate elevates symmetrically, uvula midline 11th - shoulder shrug symmetric 12th - tongue protrusion midline  MOTOR:  normal bulk and tone, full strength in the BUE, BLE except for left hip flexion 4/5  SENSORY:  Decrease sensation to vibration and pinprick to the lower extremities up to ankle. Positive Romberg.  COORDINATION:  finger-nose-finger, fine finger movements normal  GAIT/STATION:  Positive Romberg, wide based gait. Unable to tandem gait   DIAGNOSTIC DATA (LABS, IMAGING, TESTING) - I reviewed patient records, labs, notes, testing and imaging myself where available.  Lab Results  Component Value Date   WBC 9.3 06/26/2024   HGB 11.7 (L) 06/26/2024   HCT 35.5 (L) 06/26/2024   MCV 94.7 06/26/2024   PLT 226 06/26/2024      Component Value Date/Time   NA 141 06/26/2024 1352   NA 139 09/10/2016 1443   K 4.1 06/26/2024 1352   K 3.7 09/10/2016 1443   CL 102 06/26/2024 1352   CO2 28 06/26/2024 1352   CO2 29 09/10/2016 1443   GLUCOSE 178 (H) 06/26/2024 1352   GLUCOSE 94 09/10/2016 1443   BUN 10 06/26/2024 1352   BUN 20.2 09/10/2016 1443   CREATININE 0.78 06/26/2024 1352   CREATININE 0.9 09/10/2016 1443   CALCIUM  9.7 06/26/2024 1352   CALCIUM  10.3 09/10/2016 1443   PROT 6.9 06/26/2024 1352   PROT 7.6 09/10/2016 1443   ALBUMIN 4.1 06/26/2024 1352   ALBUMIN 4.1 09/10/2016 1443   AST 20 06/26/2024 1352   AST 14 09/10/2016 1443   ALT 7 06/26/2024 1352   ALT 8 09/10/2016 1443   ALKPHOS 81 06/26/2024 1352   ALKPHOS 88 09/10/2016 1443   BILITOT 0.3 06/26/2024 1352   BILITOT 0.42 09/10/2016 1443   GFRNONAA >60 06/26/2024 1352   GFRAA >60 04/14/2020 1716   Lab Results  Component Value Date   CHOL 101 05/12/2024   HDL 45 05/12/2024   LDLCALC 23 05/12/2024   TRIG 166 (H) 05/12/2024    CHOLHDL 2.3 05/12/2024   Lab Results  Component Value Date   HGBA1C 7.4 (H) 05/11/2024   Lab Results  Component Value Date   VITAMINB12 438 03/20/2021   Lab Results  Component Value Date   TSH 2.780 03/20/2021     Head CT 03/07/2021: 1.  No evidence for acute intracranial abnormality. 2. LEFT frontal scalp laceration not associated fracture. 3. Chronic paranasal sinus changes. 4. Mid cervical degenerative changes without acute cervical spine injury. 5. Re-demonstrated LEFT thyroid  nodule for which biopsy has been recommended.   Routine EEG 8/31: This is a normal EEG recording in the waking and drowsy state. No evidence of interictal epileptiform discharges seen. A normal  EEG does not exclude a diagnosis of epilepsy.   MRI Brain 05/12/2024 1. Widely scattered small Acute infarcts in the right MCA and right MCA/PCA watershed territories. 2. Cytotoxic edema and scattered acute petechial hemorrhage (right parietal and occipital). No malignant hemorrhagic transformation. No intracranial mass effect. 3. Underlying chronic white matter disease.  CTA head and Neck 05/11/2024 1. No acute large vessel occlusion. 2. No hemodynamically significant stenosis or aneurysm in the head or neck vessels. 3. No evidence of ischemia by CT brain perfusion    ASSESSMENT AND PLAN  74 y.o. year old female with past medical history of hypertension, hyperlipidemia, anxiety depression, vitamin D  deficiency, and peripheral neuropathy who is presenting after recent right hemispheric stroke in October.  Patient's stroke etiology likely large vessel disease but cannot rule out cardioembolic based on distribution of the stroke.  She is currently on Plavix  and Crestor .  Advised her to continue current medications.  She does have mild deficit in her left lower extremity, weakness in the left hip flexion but set to start physical therapy next week. In terms of her hypertension, her  valsartan/hydrochlorothiazide was discontinued on discharge from rehab.  Her blood pressure today was elevated.  She is supposed to see her PCP tomorrow, she will talk to PCP regarding restarting BP meds tomorrow; She will need to be on blood pressure medication for secondary stroke prevention.  Advised her to continue with rehab and return if as needed.   Cerebral infarction with right hemiparesis Residual left hemiparesis with numbness and weakness in the left leg. Good movement in the hand and arm. Stroke etiology likely large vessel disease but cannot rule out cardioembolic due to absence of carotid artery disease. Loop recorder in place to monitor for atrial fibrillation. No current anticoagulation therapy; clopidogrel  is being taken. - Continue physical therapy for left-sided weakness. - Await cardiologist's update regarding loop recorder findings. - Will adjust medication if atrial fibrillation is detected.  Hypertension Blood pressure elevated at 175 mmHg. Previously on valsartan and hydrochlorothiazide, which were discontinued post-hospitalization. High blood pressure poses a risk for recurrent stroke. - Discuss with primary care physician about restarting valsartan and hydrochlorothiazide. - Monitor blood pressure closely.   1. Cerebrovascular accident (CVA) due to embolism of right middle cerebral artery [facility])    Patient Instructions  Continue current medications including Plavix  and Crestor  Follow-up with PCP tomorrow to discuss reinitiation of blood pressure medication Continue your other medications Continue with physical therapy as scheduled Continue follow-up PCP Return as needed    No orders of the defined types were placed in this encounter.   No orders of the defined types were placed in this encounter.   Return if symptoms worsen or fail to improve.   Pastor Falling, MD 08/08/2024, 4:12 PM  [facility] [address], [address] [place],  [place] [address] [phone]  "

## 2024-08-10 ENCOUNTER — Ambulatory Visit: Admitting: Podiatry

## 2024-08-10 DIAGNOSIS — M79674 Pain in right toe(s): Secondary | ICD-10-CM | POA: Diagnosis not present

## 2024-08-10 DIAGNOSIS — Q666 Other congenital valgus deformities of feet: Secondary | ICD-10-CM | POA: Diagnosis not present

## 2024-08-10 DIAGNOSIS — B351 Tinea unguium: Secondary | ICD-10-CM | POA: Diagnosis not present

## 2024-08-10 DIAGNOSIS — M79675 Pain in left toe(s): Secondary | ICD-10-CM

## 2024-08-10 NOTE — Progress Notes (Signed)
"  °  Subjective:  Patient ID: [name], female    DOB: 1951/01/06,  MRN: [id number]  Chief Complaint  Patient presents with   Nail Problem    Nail trim   74 y.o. female returns for the above complaint.  Patient presents with thickened elongated dystrophic toenails x10 mild pain on palpation.  Hurts with ambulation hurts with pressure.  She would like for me to be down she is not able to do it herself.  She is a diabetic with unknown A1c  Objective:   There were no vitals filed for this visit.  Podiatric Exam: Vascular: dorsalis pedis and posterior tibial pulses are palpable bilateral. Capillary return is immediate. Temperature gradient is WNL. Skin turgor WNL  Sensorium: Normal Semmes Weinstein monofilament test. Normal tactile sensation bilaterally. Nail Exam: Pt has thick disfigured discolored nails with subungual debris noted bilateral entire nail hallux through fifth toenails.  Pain on palpation to the nails. Ulcer Exam: There is no evidence of ulcer or pre-ulcerative changes or infection. Orthopedic Exam: Gait examination as well pes planovalgus deformity calcaneovalgus to many toes signs unable to perform single and double heel raise.  Calcaneus does not return to neutral position Skin: No Porokeratosis. No infection or ulcers    Assessment & Plan:   No diagnosis found.    Patient was evaluated and treated and all questions answered.   Pes planovalgus/foot deformity -I explained to patient the etiology of pes planovalgus and relationship with heel pain/arch pain and various treatment options were discussed.  Given patient foot structure in the setting of heel pain/arch pain I believe patient will benefit from custom-made orthotics to help control the hindfoot motion support the arch of the foot and take the stress away from arches.  Patient agrees with the plan like to proceed with orthotics -Patient was casted for orthotics   Onychomycosis with pain  -Nails  palliatively debrided as below. -Educated on self-care  Procedure: Nail Debridement Rationale: pain  Type of Debridement: manual, sharp debridement. Instrumentation: Nail nipper, rotary burr. Number of Nails: 10  Procedures and Treatment: Consent by patient was obtained for treatment procedures. The patient understood the discussion of treatment and procedures well. All questions were answered thoroughly reviewed. Debridement of mycotic and hypertrophic toenails, 1 through 5 bilateral and clearing of subungual debris. No ulceration, no infection noted.  Return Visit-Office Procedure: Patient instructed to return to the office for a follow up visit 3 months for continued evaluation and treatment.  Franky Blanch, DPM    No follow-ups on file. "

## 2024-08-13 ENCOUNTER — Ambulatory Visit: Admitting: Physical Therapy

## 2024-08-13 VITALS — BP 137/83 | HR 87

## 2024-08-13 DIAGNOSIS — R2689 Other abnormalities of gait and mobility: Secondary | ICD-10-CM

## 2024-08-13 DIAGNOSIS — R2681 Unsteadiness on feet: Secondary | ICD-10-CM

## 2024-08-13 DIAGNOSIS — R296 Repeated falls: Secondary | ICD-10-CM

## 2024-08-13 DIAGNOSIS — I63411 Cerebral infarction due to embolism of right middle cerebral artery: Secondary | ICD-10-CM

## 2024-08-13 DIAGNOSIS — M6281 Muscle weakness (generalized): Secondary | ICD-10-CM | POA: Diagnosis not present

## 2024-08-13 DIAGNOSIS — R278 Other lack of coordination: Secondary | ICD-10-CM

## 2024-08-13 NOTE — Therapy (Signed)
 " OUTPATIENT PHYSICAL THERAPY NEURO TREATMENT   Patient Name: [name] MRN: [id number] DOB:October 30, 1950, 74 y.o., female Today's Date: 08/13/2024   PCP: Dyane Laurette RAMAN, FNP REFERRING PROVIDER: Carilyn Prentice BRAVO, MD  END OF SESSION:  PT End of Session - 08/13/24 1448     Visit Number 2    Number of Visits 17   with eval   Date for Recertification  10/16/24   to allow for scheduling delays   Authorization Type [employer] Medicare    PT Start Time 1445    PT Stop Time 1528    PT Time Calculation (min) 43 min    Equipment Utilized During Treatment Gait belt    Activity Tolerance Patient tolerated treatment well    Behavior During Therapy WFL for tasks assessed/performed           Past Medical History:  Diagnosis Date   Alcohol abuse, in remission    Anxiety    Cancer of sigmoid colon (HCC) 07/2005   T2N0M0   Chronic bronchitis    COPD (chronic obstructive pulmonary disease) (HCC)    Depression    Diabetes mellitus without complication (HCC)    GERD (gastroesophageal reflux disease)    History of thyroid  nodule    monitoring   Hyperlipidemia    Hypertension    Migraines    OA (osteoarthritis)    PONV (postoperative nausea and vomiting)    Reflux    Past Surgical History:  Procedure Laterality Date   ABDOMINAL EXPOSURE N/A 11/24/2016   Procedure: ABDOMINAL EXPOSURE;  Surgeon: Krystal JULIANNA Doing, MD;  Location: [facility] OR;  Service: Vascular;  Laterality: N/A;   ABDOMINAL HYSTERECTOMY  1992   age 43 and appendics   ANTERIOR LUMBAR FUSION Bilateral 11/24/2016   Procedure: LUMBAR 5-SACRUM 1 ANTERIOR LUMBAR INTERBODY FUSION;  Surgeon: Oneil Priestly, MD;  Location: [facility] OR;  Service: Orthopedics;  Laterality: Bilateral;  LUMBAR 5-SACRUM 1 ANTERIOR LUMBAR INTERBODY FUSION    ARTHRODESIS METATARSALPHALANGEAL JOINT (MTPJ) Left 08/15/2023   Procedure: ARTHRODESIS METATARSALPHALANGEAL JOINT (MTPJ);  Surgeon: Tobie Franky SQUIBB, DPM;  Location: [facility] ORS;  Service: Orthopedics/Podiatry;   Laterality: Left;  POPLITEAL/SAPHENOUS BLOCK   bilateral breast surgery      benign tumors removed   CHOLECYSTECTOMY  2007   COLON RESECTION  2006   COLONOSCOPY  2019   HAND SURGERY  2010   LOOP RECORDER INSERTION N/A 05/17/2024   Procedure: LOOP RECORDER INSERTION;  Surgeon: Inocencio Soyla Lunger, MD;  Location: [facility] INVAS[facility];  Service: Cardiovascular;  Laterality: N/A;   repair of incisional hernia  2012   SHOULDER ARTHROSCOPY Left 2009   SHOULDER SURGERY Right 1997   rotator cuff   TONSILLECTOMY     as a child   TOTAL KNEE ARTHROPLASTY Left 08/11/2021   Procedure: TOTAL KNEE ARTHROPLASTY;  Surgeon: Beverley Evalene JONETTA, MD;  Location: [facility] ORS;  Service: Orthopedics;  Laterality: Left;   TOTAL KNEE ARTHROPLASTY Right 06/29/2022   Procedure: TOTAL KNEE ARTHROPLASTY;  Surgeon: Beverley Evalene JONETTA, MD;  Location: [facility] ORS;  Service: Orthopedics;  Laterality: Right;   VIDEO BRONCHOSCOPY WITH ENDOBRONCHIAL NAVIGATION N/A 04/23/2024   Procedure: VIDEO BRONCHOSCOPY WITH ENDOBRONCHIAL NAVIGATION;  Surgeon: Shelah Lamar RAMAN, MD;  Location: [facility] ENDOSCOPY;  Service: Pulmonary;  Laterality: N/A;  lingular nodule   Patient Active Problem List   Diagnosis Date Noted   Depression with anxiety 05/21/2024   CVA (cerebral vascular accident) (HCC) 05/17/2024   Acute CVA (cerebrovascular accident) (HCC) 05/12/2024   Gastroesophageal  reflux disease 05/11/2024   Drug-induced myopathy 05/11/2024   Diabetic foot ulcer (HCC) 05/11/2024   Benign essential hypertension 05/11/2024   Major depressive disorder, recurrent, moderate (HCC) 05/11/2024   Malignant neoplasm of lung (HCC) 05/11/2024   Thyroid  nodule 05/11/2024   Panic disorder 05/11/2024   Migraine without aura and responsive to treatment 05/11/2024   Recurrent falls 05/11/2024   Stage 2 chronic kidney disease 05/11/2024   Poorly controlled type 2 diabetes mellitus (HCC) 05/11/2024   Acute hypoxemic respiratory failure (HCC) 05/11/2024   Acute  left-sided weakness 05/11/2024   AKI (acute kidney injury) 05/11/2024   Hypokalemia 05/11/2024   Pulmonary nodule 1 cm or greater in diameter 04/23/2024   Lung nodules 04/11/2024   S/P total knee arthroplasty, right 06/29/2022   Multinodular goiter 06/11/2021   Recurrent ventral incisional hernia 06/11/2021   Centrilobular emphysema (HCC) 12/13/2020   Radiculopathy 11/24/2016   H/O colon cancer, stage I 01/19/2013   Nonspecific abnormal electrocardiogram (ECG) (EKG) 03/31/2011   Chest pain 03/31/2011   Hyperlipidemia 03/31/2011   Essential hypertension 03/31/2011   Tobacco abuse 03/31/2011    ONSET DATE: 07/12/2024 (referral date); stroke 05/12/2024  REFERRING DIAG: I69.398,R26.9 (ICD-10-CM) - Gait disturbance, post-stroke  THERAPY DIAG:  Muscle weakness (generalized)  Other abnormalities of gait and mobility  Unsteadiness on feet  Other lack of coordination  Repeated falls  Cerebrovascular accident (CVA) due to embolism of right middle cerebral artery (HCC)  Rationale for Evaluation and Treatment: Rehabilitation  SUBJECTIVE:                                                                                                                                                                                             SUBJECTIVE STATEMENT:   Pt is back on her blood pressure medication. Pt did see Dr. Gregg since last visit, does not need to follow-up unless something changes. Pt also saw her PCP last week, will follow-up again in 3 weeks.  Pt did have one fall since last visit, tried to sit down in her chair and missed it. She had a lot of trouble getting back up (had to use a cushion under her knees).  Pt not feeling comfortable with showers, does have a shower seat, non-slip mat, grab bars, etc but always feels like she slides a little bit. -not her feet that slide but her hand that slides on the bar - makes sure soap is washed off her hand, rag, etc.  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  Pt saw podiatrist (Dr. Tobie) on Friday, drop foot on the L and got her a brace/insert for this, takes 6-8 weeks to come in.  HEP from CIR: heel/toe raises, seated marches, seated LAQ and HS curls, seated hip abd, standing at kitchen counter   Pt accompanied by: self, drives herself  PERTINENT HISTORY: PMH: tobacco abuse, squamous cell carcinoma of the lung (newly diagnosed 04/2024), coronary artery calcification, migraines, COPD, chronic bronchitis, colon cancer, hypertension, hyperlipidemia, type 2 diabetes, thyroid  nodule, arthritis, migraines, anxiety, depression, and recent stroke.  PAIN:  Are you having pain? No  PRECAUTIONS: Fall and Other: loop recorder  RED FLAGS: None   WEIGHT BEARING RESTRICTIONS: No  FALLS: Has patient fallen in last 6 months? Yes. Number of falls 2 falls since October, was able to get back up herself, no injuries  LIVING ENVIRONMENT: Lives with: lives with their partner; she is caregiver for her partner - MS Lives in: House/apartment Stairs: Yes: Internal: 14 steps; on right going up (except for a short distance across a platform - is working on getting a rail installed here) Has following equipment at home: Single point cane and Environmental Consultant - 2 wheeled  PLOF: Independent with gait, Independent with transfers, and Requires assistive device for independence  PATIENT GOALS: to get a lot more strength in my legs, I felt imbalanced prior to the stroke get my balance back so I don't have to use a cane  OBJECTIVE:  Note: Objective measures were completed at Evaluation unless otherwise noted.  DIAGNOSTIC FINDINGS:  Brain MRI 05/12/2024 IMPRESSION: 1. Widely scattered small Acute infarcts in the right MCA and right MCA/PCA watershed territories. 2. Cytotoxic edema and scattered  acute petechial hemorrhage (right parietal and occipital). No malignant hemorrhagic transformation. No intracranial mass effect. 3. Underlying chronic white matter disease.  COGNITION: Overall cognitive status: has some trouble with word-finding at times   SENSATION: N/T in L leg down into foot up to about her knee Mild sensory impairments in RLE per pt report  COORDINATION: Impaired in L hemibody  EDEMA:  Mild in [name]   POSTURE: rounded shoulders, forward head, and posterior pelvic tilt  LOWER EXTREMITY ROM:   WFL  Active  Right Eval Left Eval  Hip flexion    Hip extension    Hip abduction    Hip adduction    Hip internal rotation    Hip external rotation    Knee flexion    Knee extension    Ankle dorsiflexion    Ankle plantarflexion    Ankle inversion    Ankle eversion     (Blank rows = not tested)  LOWER EXTREMITY MMT:    MMT Right Eval Left Eval  Hip flexion 4+ 3+  Hip extension    Hip abduction    Hip adduction    Hip internal rotation    Hip external rotation    Knee flexion 4+ 3+  Knee extension 4+ 3+  Ankle dorsiflexion 4+ 4  Ankle plantarflexion    Ankle inversion    Ankle eversion    (Blank rows = not tested)  BED MOBILITY:  Sleeping in chair currently as her bed is upstairs; may have to get a bedrail when she gets back to her bed, her bed is tall off the floor  TRANSFERS: Sit to stand: Modified independence  Assistive device utilized: None     Stand to sit: Modified independence  Assistive device utilized: None  Chair to chair: Modified independence  Assistive device utilized: None       STAIRS: STAIRS:  Level of Assistance: SBA  Stair Negotiation Technique: Step to Pattern with Bilateral Rails  Number of Stairs: 6   Height of Stairs: 4  Comments: step to gait pattern, slow and cautious  GAIT: Findings:  Gait pattern: path deviation - weaving and deviation to the L, decreased hip/knee flexion- Left, and decreased ankle  dorsiflexion- Left Distance walked: various clinic distances Assistive device utilized: Single point cane Level of assistance: SBA   FUNCTIONAL TESTS:                                                                                                                                  TREATMENT DATE:    Self-Care/Home Management Vitals:   08/13/24 1458  BP: 137/83  Pulse: 87    Assessed in RUE while seated at rest. BP reading WNL and significantly improved as compared to initial evaluation.   TherAct Quick visual assessment: R peripheral vision decreased (birth defect) as compared to L peripheral vision Convergence normal Visual tracking WFL  For balance assessment: mCTSIB: Condition 1: 30 sec Condition 2: 30 sec Condition 3: 30 sec Condition 4: 30 sec       =120/120    Floor Recovery: Patient educated in floor recovery this visit using teach-back for injury assessment and sequencing of task in clinic setting.  Discussion of transfer of skills to variable scenarios outside the clinic.  Patient has most difficulty with tolerating being kneeling position.  Performed 1 time. Level of Assist:  Verbal/tactile cues.   Discussed sitting on the floor to recover after a fall before trying to get back up and pt then reports she had difficulty getting back up due to fatigue. She is able to perform a floor to chair transfer with just cueing needed and no physical assist but did have difficulty with this at home when she was more fatigued.  To work on functional LE strengthening: Staggered sit to stand with [name] pulled back 2 x 10 reps   Added to HEP, see bolded below   PATIENT EDUCATION: Education details: initial HEP Person educated: Patient Education method: Explanation, Demonstration, and Handouts Education comprehension: verbalized understanding, returned demonstration, and needs further education  HOME EXERCISE PROGRAM: Access Code: [email or web address] URL:  [email or web address] Date: 08/13/2024 Prepared by: Waddell Southgate  Exercises - Staggered Sit-to-Stand  - 1 x daily - 7 x weekly - 3 sets - 10 reps  GOALS: Goals reviewed with patient? Yes  SHORT TERM GOALS: Target date: 09/04/2024   Pt will be independent with initial HEP for improved strength, balance, transfers and gait. Baseline: Goal status: INITIAL  2.  Pt will improve gait velocity to at least 2.5 ft/sec for improved gait efficiency and performance at SBA level  Baseline: 2.29 ft/sec SBA no AD (1/6) Goal status: INITIAL  3.  Pt will improve FGA to 18/30  for decreased fall risk  Baseline: 14/30 (1/6) Goal status: INITIAL  4.  Pt will be able to navigate up/down a flight of stairs with one handrail at mod I level with step-to gait pattern Baseline:  Goal status: INITIAL   LONG TERM GOALS: Target date: 10/02/2024   Pt will be independent with final HEP for improved strength, balance, transfers and gait. Baseline:  Goal status: INITIAL  2.  Pt will improve gait velocity to at least 2.75 ft/sec for improved gait efficiency and performance at mod I level  Baseline: 2.29 ft/sec SBA no AD (1/6) Goal status: INITIAL  3.  Pt will improve FGA to 22/30 for decreased fall risk  Baseline: 14/30 (1/6) Goal status: INITIAL  4.  Pt will be able to navigate up/down a flight of stairs with one handrail at mod I level with alternating gait pattern Baseline:  Goal status: INITIAL  5.  mCTSIB to be assessed and LTG set Baseline: 120/120 (1/12) Goal status: D/C GOAL due to high score    ASSESSMENT:  CLINICAL IMPRESSION: Emphasis of skilled PT session on continuing to assess vitals due to hypertension during eval, further assessing visual impairments and balance, and initiating HEP. Pt's BP significantly improved this visit as compared to initial assessment and she has started taking her BP medication again, was incorrectly told to stop taking it when she was d/c  from the hospital. She has baseline visual impairments in her R eye due to a birth defect but no visual impairments on her L side observed this visit. She scores 120/120 on the mCSTIB so LTG for this was d/c. She continues to exhibit mild L hemibody impairments as well as difficulty with some functional mobility and transfers and continues to benefit from skilled PT services to work towards increased safety and independence with functional mobility. Continue POC.    OBJECTIVE IMPAIRMENTS: Abnormal gait, decreased activity tolerance, decreased balance, decreased cognition, decreased coordination, decreased endurance, difficulty walking, decreased strength, impaired sensation, impaired UE functional use, impaired vision/preception, and postural dysfunction.   ACTIVITY LIMITATIONS: carrying, lifting, bending, squatting, stairs, bed mobility, and caring for others  PARTICIPATION LIMITATIONS: cleaning, laundry, and community activity  PERSONAL FACTORS: Fitness and 3+ comorbidities:   tobacco abuse, squamous cell carcinoma of the lung (newly diagnosed 04/2024), coronary artery calcification, migraines, COPD, chronic bronchitis, colon cancer, hypertension, hyperlipidemia, type 2 diabetes, thyroid  nodule, arthritis, migraines, anxiety, depression, and recent stroke.are also affecting patient's functional outcome.   REHAB POTENTIAL: Good  CLINICAL DECISION MAKING: Evolving/moderate complexity  EVALUATION COMPLEXITY: Moderate  PLAN:  PT FREQUENCY: 2x/week  PT DURATION: 8 weeks  PLANNED INTERVENTIONS: 97164- PT Re-evaluation, 97750- Physical Performance Testing, 97110-Therapeutic exercises, 97530- Therapeutic activity, 97112- Neuromuscular re-education, 97535- Self Care, [id number]- Manual therapy, [phone]- Gait training, [phone]- Orthotic Initial, [phone]- Orthotic/Prosthetic subsequent, Patient/Family education, Balance training, Stair training, Taping, Joint mobilization, Visual/preceptual  remediation/compensation, Cognitive remediation, DME instructions, Cryotherapy, and Moist heat  PLAN FOR NEXT SESSION: CHECK BP, add to HEP as needed to work on [name] NMR/strengthening and balance impairments; floor transfers, stairs   Waddell Southgate, PT Waddell Southgate, PT, DPT, CSRS  08/13/2024, 3:28 PM        "

## 2024-08-16 ENCOUNTER — Ambulatory Visit: Admitting: Physical Therapy

## 2024-08-16 VITALS — BP 142/81 | HR 96

## 2024-08-16 DIAGNOSIS — R2681 Unsteadiness on feet: Secondary | ICD-10-CM

## 2024-08-16 DIAGNOSIS — M6281 Muscle weakness (generalized): Secondary | ICD-10-CM | POA: Diagnosis not present

## 2024-08-16 DIAGNOSIS — R296 Repeated falls: Secondary | ICD-10-CM

## 2024-08-16 DIAGNOSIS — R2689 Other abnormalities of gait and mobility: Secondary | ICD-10-CM

## 2024-08-16 DIAGNOSIS — R278 Other lack of coordination: Secondary | ICD-10-CM

## 2024-08-16 NOTE — Therapy (Signed)
 " OUTPATIENT PHYSICAL THERAPY NEURO TREATMENT   Patient Name: [name] MRN: [id number] DOB:05-25-1951, 74 y.o., female Today's Date: 08/16/2024   PCP: Dyane Laurette RAMAN, FNP REFERRING PROVIDER: Carilyn Prentice BRAVO, MD  END OF SESSION:  PT End of Session - 08/16/24 1448     Visit Number 3    Number of Visits 17   with eval   Date for Recertification  10/16/24   to allow for scheduling delays   Authorization Type [employer] Medicare    PT Start Time 1446    PT Stop Time 1529    PT Time Calculation (min) 43 min    Equipment Utilized During Treatment Gait belt    Activity Tolerance Patient tolerated treatment well    Behavior During Therapy WFL for tasks assessed/performed            Past Medical History:  Diagnosis Date   Alcohol abuse, in remission    Anxiety    Cancer of sigmoid colon (HCC) 07/2005   T2N0M0   Chronic bronchitis    COPD (chronic obstructive pulmonary disease) (HCC)    Depression    Diabetes mellitus without complication (HCC)    GERD (gastroesophageal reflux disease)    History of thyroid  nodule    monitoring   Hyperlipidemia    Hypertension    Migraines    OA (osteoarthritis)    PONV (postoperative nausea and vomiting)    Reflux    Past Surgical History:  Procedure Laterality Date   ABDOMINAL EXPOSURE N/A 11/24/2016   Procedure: ABDOMINAL EXPOSURE;  Surgeon: Krystal JULIANNA Doing, MD;  Location: [facility] OR;  Service: Vascular;  Laterality: N/A;   ABDOMINAL HYSTERECTOMY  1992   age 83 and appendics   ANTERIOR LUMBAR FUSION Bilateral 11/24/2016   Procedure: LUMBAR 5-SACRUM 1 ANTERIOR LUMBAR INTERBODY FUSION;  Surgeon: Oneil Priestly, MD;  Location: [facility] OR;  Service: Orthopedics;  Laterality: Bilateral;  LUMBAR 5-SACRUM 1 ANTERIOR LUMBAR INTERBODY FUSION    ARTHRODESIS METATARSALPHALANGEAL JOINT (MTPJ) Left 08/15/2023   Procedure: ARTHRODESIS METATARSALPHALANGEAL JOINT (MTPJ);  Surgeon: Tobie Franky SQUIBB, DPM;  Location: [facility] ORS;  Service: Orthopedics/Podiatry;   Laterality: Left;  POPLITEAL/SAPHENOUS BLOCK   bilateral breast surgery      benign tumors removed   CHOLECYSTECTOMY  2007   COLON RESECTION  2006   COLONOSCOPY  2019   HAND SURGERY  2010   LOOP RECORDER INSERTION N/A 05/17/2024   Procedure: LOOP RECORDER INSERTION;  Surgeon: Inocencio Soyla Lunger, MD;  Location: [facility] INVASIVE CV LAB;  Service: Cardiovascular;  Laterality: N/A;   repair of incisional hernia  2012   SHOULDER ARTHROSCOPY Left 2009   SHOULDER SURGERY Right 1997   rotator cuff   TONSILLECTOMY     as a child   TOTAL KNEE ARTHROPLASTY Left 08/11/2021   Procedure: TOTAL KNEE ARTHROPLASTY;  Surgeon: Beverley Evalene JONETTA, MD;  Location: [facility] ORS;  Service: Orthopedics;  Laterality: Left;   TOTAL KNEE ARTHROPLASTY Right 06/29/2022   Procedure: TOTAL KNEE ARTHROPLASTY;  Surgeon: Beverley Evalene JONETTA, MD;  Location: [facility] ORS;  Service: Orthopedics;  Laterality: Right;   VIDEO BRONCHOSCOPY WITH ENDOBRONCHIAL NAVIGATION N/A 04/23/2024   Procedure: VIDEO BRONCHOSCOPY WITH ENDOBRONCHIAL NAVIGATION;  Surgeon: Shelah Lamar RAMAN, MD;  Location: [facility] ENDOSCOPY;  Service: Pulmonary;  Laterality: N/A;  lingular nodule   Patient Active Problem List   Diagnosis Date Noted   Depression with anxiety 05/21/2024   CVA (cerebral vascular accident) (HCC) 05/17/2024   Acute CVA (cerebrovascular accident) (HCC) 05/12/2024  Gastroesophageal reflux disease 05/11/2024   Drug-induced myopathy 05/11/2024   Diabetic foot ulcer (HCC) 05/11/2024   Benign essential hypertension 05/11/2024   Major depressive disorder, recurrent, moderate (HCC) 05/11/2024   Malignant neoplasm of lung (HCC) 05/11/2024   Thyroid  nodule 05/11/2024   Panic disorder 05/11/2024   Migraine without aura and responsive to treatment 05/11/2024   Recurrent falls 05/11/2024   Stage 2 chronic kidney disease 05/11/2024   Poorly controlled type 2 diabetes mellitus (HCC) 05/11/2024   Acute hypoxemic respiratory failure (HCC) 05/11/2024   Acute  left-sided weakness 05/11/2024   AKI (acute kidney injury) 05/11/2024   Hypokalemia 05/11/2024   Pulmonary nodule 1 cm or greater in diameter 04/23/2024   Lung nodules 04/11/2024   S/P total knee arthroplasty, right 06/29/2022   Multinodular goiter 06/11/2021   Recurrent ventral incisional hernia 06/11/2021   Centrilobular emphysema (HCC) 12/13/2020   Radiculopathy 11/24/2016   H/O colon cancer, stage I 01/19/2013   Nonspecific abnormal electrocardiogram (ECG) (EKG) 03/31/2011   Chest pain 03/31/2011   Hyperlipidemia 03/31/2011   Essential hypertension 03/31/2011   Tobacco abuse 03/31/2011    ONSET DATE: 07/12/2024 (referral date); stroke 05/12/2024  REFERRING DIAG: I69.398,R26.9 (ICD-10-CM) - Gait disturbance, post-stroke  THERAPY DIAG:  Muscle weakness (generalized)  Other abnormalities of gait and mobility  Unsteadiness on feet  Other lack of coordination  Repeated falls  Rationale for Evaluation and Treatment: Rehabilitation  SUBJECTIVE:                                                                                                                                                                                             SUBJECTIVE STATEMENT:   Pt reports she feels more off balance today, is not sure how to explain it. States she turned around this morning when making her coffee and felt like she was just going. No pain or falls since last session.    Pt accompanied by: self, drives herself  PERTINENT HISTORY: PMH: tobacco abuse, squamous cell carcinoma of the lung (newly diagnosed 04/2024), coronary artery calcification, migraines, COPD, chronic bronchitis, colon cancer, hypertension, hyperlipidemia, type 2 diabetes, thyroid  nodule, arthritis, migraines, anxiety, depression, and recent stroke.  PAIN:  Are you having pain? No  PRECAUTIONS: Fall and Other: loop recorder  RED FLAGS: None   WEIGHT BEARING RESTRICTIONS: No  FALLS: Has patient fallen in last  6 months? Yes. Number of falls 2 falls since October, was able to get back up herself, no injuries  LIVING ENVIRONMENT: Lives with: lives with their partner; she is caregiver for her partner - MS Lives in: House/apartment Stairs: Yes: Internal: 14 steps; on right  going up (except for a short distance across a platform - is working on getting a rail installed here) Has following equipment at home: Single point cane and Environmental Consultant - 2 wheeled  PLOF: Independent with gait, Independent with transfers, and Requires assistive device for independence  PATIENT GOALS: to get a lot more strength in my legs, I felt imbalanced prior to the stroke get my balance back so I don't have to use a cane  OBJECTIVE:  Note: Objective measures were completed at Evaluation unless otherwise noted.  DIAGNOSTIC FINDINGS:  Brain MRI 05/12/2024 IMPRESSION: 1. Widely scattered small Acute infarcts in the right MCA and right MCA/PCA watershed territories. 2. Cytotoxic edema and scattered acute petechial hemorrhage (right parietal and occipital). No malignant hemorrhagic transformation. No intracranial mass effect. 3. Underlying chronic white matter disease.  COGNITION: Overall cognitive status: has some trouble with word-finding at times   SENSATION: N/T in L leg down into foot up to about her knee Mild sensory impairments in RLE per pt report  COORDINATION: Impaired in L hemibody  EDEMA:  Mild in [name]   POSTURE: rounded shoulders, forward head, and posterior pelvic tilt  LOWER EXTREMITY ROM:   WFL  Active  Right Eval Left Eval  Hip flexion    Hip extension    Hip abduction    Hip adduction    Hip internal rotation    Hip external rotation    Knee flexion    Knee extension    Ankle dorsiflexion    Ankle plantarflexion    Ankle inversion    Ankle eversion     (Blank rows = not tested)  LOWER EXTREMITY MMT:    MMT Right Eval Left Eval  Hip flexion 4+ 3+  Hip extension    Hip  abduction    Hip adduction    Hip internal rotation    Hip external rotation    Knee flexion 4+ 3+  Knee extension 4+ 3+  Ankle dorsiflexion 4+ 4  Ankle plantarflexion    Ankle inversion    Ankle eversion    (Blank rows = not tested)  BED MOBILITY:  Sleeping in chair currently as her bed is upstairs; may have to get a bedrail when she gets back to her bed, her bed is tall off the floor  TRANSFERS: Sit to stand: Modified independence  Assistive device utilized: None     Stand to sit: Modified independence  Assistive device utilized: None     Chair to chair: Modified independence  Assistive device utilized: None       STAIRS: STAIRS:  Level of Assistance: SBA  Stair Negotiation Technique: Step to Pattern with Bilateral Rails  Number of Stairs: 6   Height of Stairs: 4  Comments: step to gait pattern, slow and cautious  GAIT: Findings:  Gait pattern: path deviation - weaving and deviation to the L, decreased hip/knee flexion- Left, and decreased ankle dorsiflexion- Left Distance walked: various clinic distances Assistive device utilized: Single point cane Level of assistance: SBA   FUNCTIONAL TESTS:    VITALS  Vitals:   08/16/24 1453  BP: (!) 142/81  Pulse: 96  TREATMENT DATE:    Self-Care/Home Management Assessed in [name] while seated at rest (see above) and within limits for session.   NMR  For improved ankle strategy, stepping strategy, postural control, vestibular input and single leg stability: On rockerboard in A/P direction:   Standing w/no UE support, x3 minutes. Pt demonstrated significant posterior lean, requiring mod tactile cues to shift weight forward. I feel like I will fall.  Head turns in vertical and horizontal direction, x90s each. Increased difficulty turning head to R, requiring CGA-min A due to posterior LOB. Provided  mod cues to facilitate ankle strategy, as pt very rigid due to fear of falling. However, once able to facilitate ankle strategy, pt able to perform without LOB and w/SBA Alt retro step off board, x12 reps per side w/BUE support > no UE support w/CGA. Min cues for increased step length w/RLE, as pt frequently catching edge of board w/foot  Alt fwd step  off board w/BUE support > no UE support. Min A required due to posterior instability and inconsistent foot placement on board despite cues.  Suitcase carries w/12# KB, x3 reps down and back in // bars per UE. Increased difficulty holding on R side, w/increased instability on L and inconsistent L foot placement noted.  Discussed importance of proper foot placement for improved stability and reduced fall risk, as pt only unstable today when she stood w/narrow BOS.  Discussed practicing proper lifting techniques w/use of gait belt, as pt's house partner frequently falls and pt feels as though she will fall trying to lift her   PATIENT EDUCATION: Education details: Continue HEP, plan to practice how to lift a person from ground using gait belt  Person educated: Patient Education method: Explanation and Demonstration Education comprehension: verbalized understanding, returned demonstration, and needs further education  HOME EXERCISE PROGRAM: Access Code: [email or web address] URL: [email or web address] Date: 08/13/2024 Prepared by: Waddell Southgate  Exercises - Staggered Sit-to-Stand  - 1 x daily - 7 x weekly - 3 sets - 10 reps  GOALS: Goals reviewed with patient? Yes  SHORT TERM GOALS: Target date: 09/04/2024   Pt will be independent with initial HEP for improved strength, balance, transfers and gait. Baseline: Goal status: INITIAL  2.  Pt will improve gait velocity to at least 2.5 ft/sec for improved gait efficiency and performance at SBA level  Baseline: 2.29 ft/sec SBA no AD (1/6) Goal status: INITIAL  3.  Pt will improve FGA to 18/30  for decreased fall risk  Baseline: 14/30 (1/6) Goal status: INITIAL  4.  Pt will be able to navigate up/down a flight of stairs with one handrail at mod I level with step-to gait pattern Baseline:  Goal status: INITIAL   LONG TERM GOALS: Target date: 10/02/2024   Pt will be independent with final HEP for improved strength, balance, transfers and gait. Baseline:  Goal status: INITIAL  2.  Pt will improve gait velocity to at least 2.75 ft/sec for improved gait efficiency and performance at mod I level  Baseline: 2.29 ft/sec SBA no AD (1/6) Goal status: INITIAL  3.  Pt will improve FGA to 22/30 for decreased fall risk  Baseline: 14/30 (1/6) Goal status: INITIAL  4.  Pt will be able to navigate up/down a flight of stairs with one handrail at mod I level with alternating gait pattern Baseline:  Goal status: INITIAL  5.  mCTSIB to be assessed and LTG set Baseline: 120/120 (1/12) Goal status: D/C GOAL due to high score    ASSESSMENT:  CLINICAL IMPRESSION: Emphasis of skilled PT session on facilitation of ankle and stepping strategy, functional BLE strength and vestibular input. Pt's BP WNL today now that she is back on BP medication. Pt reported feeling more unsteady today and noted significant inconsistencies in L foot placement during session, which caused pt to be unstable. Educated pt on importance of proper foot placement and worked on facilitation of ankle/stepping strategy for balance correction, as pt tends to stand rigid due to fear of falling. Continue POC.    OBJECTIVE IMPAIRMENTS: Abnormal gait, decreased activity tolerance, decreased balance, decreased cognition, decreased coordination, decreased endurance, difficulty walking, decreased strength, impaired sensation, impaired UE functional use, impaired vision/preception, and postural dysfunction.   ACTIVITY LIMITATIONS: carrying, lifting, bending, squatting, stairs, bed mobility, and caring for others  PARTICIPATION  LIMITATIONS: cleaning, laundry, and community activity  PERSONAL FACTORS: Fitness and 3+ comorbidities:   tobacco abuse, squamous cell carcinoma of the lung (newly diagnosed 04/2024), coronary artery calcification, migraines, COPD, chronic bronchitis, colon cancer, hypertension, hyperlipidemia, type 2 diabetes, thyroid  nodule, arthritis, migraines, anxiety, depression, and recent stroke.are also affecting patient's functional outcome.   REHAB POTENTIAL: Good  CLINICAL DECISION MAKING: Evolving/moderate complexity  EVALUATION COMPLEXITY: Moderate  PLAN:  PT FREQUENCY: 2x/week  PT DURATION: 8 weeks  PLANNED INTERVENTIONS: 97164- PT Re-evaluation, 97750- Physical Performance Testing, 97110-Therapeutic exercises, 97530- Therapeutic activity, 97112- Neuromuscular re-education, 97535- Self Care, [id number]- Manual therapy, [phone]- Gait training, [phone]- Orthotic Initial, [phone]- Orthotic/Prosthetic subsequent, Patient/Family education, Balance training, Stair training, Taping, Joint mobilization, Visual/preceptual remediation/compensation, Cognitive remediation, DME instructions, Cryotherapy, and Moist heat  PLAN FOR NEXT SESSION: CHECK BP, add to HEP as needed to work on [name] NMR/strengthening and balance impairments; floor transfers, stairs, stepping/ankle strategy    Maelys Kinnick E Bula Cavalieri, PT, DPT 08/16/2024, 4:15 PM        "

## 2024-08-20 ENCOUNTER — Ambulatory Visit: Admitting: Physical Therapy

## 2024-08-20 VITALS — BP 144/95 | HR 83

## 2024-08-20 DIAGNOSIS — M6281 Muscle weakness (generalized): Secondary | ICD-10-CM | POA: Diagnosis not present

## 2024-08-20 DIAGNOSIS — R296 Repeated falls: Secondary | ICD-10-CM

## 2024-08-20 DIAGNOSIS — R2689 Other abnormalities of gait and mobility: Secondary | ICD-10-CM

## 2024-08-20 DIAGNOSIS — R2681 Unsteadiness on feet: Secondary | ICD-10-CM

## 2024-08-20 DIAGNOSIS — R278 Other lack of coordination: Secondary | ICD-10-CM

## 2024-08-20 DIAGNOSIS — I63411 Cerebral infarction due to embolism of right middle cerebral artery: Secondary | ICD-10-CM

## 2024-08-20 NOTE — Therapy (Signed)
 " OUTPATIENT PHYSICAL THERAPY NEURO TREATMENT   Patient Name: [name] MRN: [id number] DOB:05-24-1951, 74 y.o., female Today's Date: 08/20/2024   PCP: Dyane Laurette RAMAN, FNP REFERRING PROVIDER: Carilyn Prentice BRAVO, MD  END OF SESSION:  PT End of Session - 08/20/24 1312     Visit Number 4    Number of Visits 17   with eval   Date for Recertification  10/16/24   to allow for scheduling delays   Authorization Type [employer] Medicare    PT Start Time 1312    PT Stop Time 1400    PT Time Calculation (min) 48 min    Equipment Utilized During Treatment Gait belt    Activity Tolerance Patient tolerated treatment well    Behavior During Therapy WFL for tasks assessed/performed             Past Medical History:  Diagnosis Date   Alcohol abuse, in remission    Anxiety    Cancer of sigmoid colon (HCC) 07/2005   T2N0M0   Chronic bronchitis    COPD (chronic obstructive pulmonary disease) (HCC)    Depression    Diabetes mellitus without complication (HCC)    GERD (gastroesophageal reflux disease)    History of thyroid  nodule    monitoring   Hyperlipidemia    Hypertension    Migraines    OA (osteoarthritis)    PONV (postoperative nausea and vomiting)    Reflux    Past Surgical History:  Procedure Laterality Date   ABDOMINAL EXPOSURE N/A 11/24/2016   Procedure: ABDOMINAL EXPOSURE;  Surgeon: Krystal JULIANNA Doing, MD;  Location: [facility] OR;  Service: Vascular;  Laterality: N/A;   ABDOMINAL HYSTERECTOMY  1992   age 37 and appendics   ANTERIOR LUMBAR FUSION Bilateral 11/24/2016   Procedure: LUMBAR 5-SACRUM 1 ANTERIOR LUMBAR INTERBODY FUSION;  Surgeon: Oneil Priestly, MD;  Location: [facility] OR;  Service: Orthopedics;  Laterality: Bilateral;  LUMBAR 5-SACRUM 1 ANTERIOR LUMBAR INTERBODY FUSION    ARTHRODESIS METATARSALPHALANGEAL JOINT (MTPJ) Left 08/15/2023   Procedure: ARTHRODESIS METATARSALPHALANGEAL JOINT (MTPJ);  Surgeon: Tobie Franky SQUIBB, DPM;  Location: [facility] ORS;  Service: Orthopedics/Podiatry;   Laterality: Left;  POPLITEAL/SAPHENOUS BLOCK   bilateral breast surgery      benign tumors removed   CHOLECYSTECTOMY  2007   COLON RESECTION  2006   COLONOSCOPY  2019   HAND SURGERY  2010   LOOP RECORDER INSERTION N/A 05/17/2024   Procedure: LOOP RECORDER INSERTION;  Surgeon: Inocencio Soyla Lunger, MD;  Location: [facility] INVASIVE CV LAB;  Service: Cardiovascular;  Laterality: N/A;   repair of incisional hernia  2012   SHOULDER ARTHROSCOPY Left 2009   SHOULDER SURGERY Right 1997   rotator cuff   TONSILLECTOMY     as a child   TOTAL KNEE ARTHROPLASTY Left 08/11/2021   Procedure: TOTAL KNEE ARTHROPLASTY;  Surgeon: Beverley Evalene JONETTA, MD;  Location: [facility] ORS;  Service: Orthopedics;  Laterality: Left;   TOTAL KNEE ARTHROPLASTY Right 06/29/2022   Procedure: TOTAL KNEE ARTHROPLASTY;  Surgeon: Beverley Evalene JONETTA, MD;  Location: [facility] ORS;  Service: Orthopedics;  Laterality: Right;   VIDEO BRONCHOSCOPY WITH ENDOBRONCHIAL NAVIGATION N/A 04/23/2024   Procedure: VIDEO BRONCHOSCOPY WITH ENDOBRONCHIAL NAVIGATION;  Surgeon: Shelah Lamar RAMAN, MD;  Location: [facility] ENDOSCOPY;  Service: Pulmonary;  Laterality: N/A;  lingular nodule   Patient Active Problem List   Diagnosis Date Noted   Depression with anxiety 05/21/2024   CVA (cerebral vascular accident) (HCC) 05/17/2024   Acute CVA (cerebrovascular accident) (HCC) 05/12/2024  Gastroesophageal reflux disease 05/11/2024   Drug-induced myopathy 05/11/2024   Diabetic foot ulcer (HCC) 05/11/2024   Benign essential hypertension 05/11/2024   Major depressive disorder, recurrent, moderate (HCC) 05/11/2024   Malignant neoplasm of lung (HCC) 05/11/2024   Thyroid  nodule 05/11/2024   Panic disorder 05/11/2024   Migraine without aura and responsive to treatment 05/11/2024   Recurrent falls 05/11/2024   Stage 2 chronic kidney disease 05/11/2024   Poorly controlled type 2 diabetes mellitus (HCC) 05/11/2024   Acute hypoxemic respiratory failure (HCC) 05/11/2024   Acute  left-sided weakness 05/11/2024   AKI (acute kidney injury) 05/11/2024   Hypokalemia 05/11/2024   Pulmonary nodule 1 cm or greater in diameter 04/23/2024   Lung nodules 04/11/2024   S/P total knee arthroplasty, right 06/29/2022   Multinodular goiter 06/11/2021   Recurrent ventral incisional hernia 06/11/2021   Centrilobular emphysema (HCC) 12/13/2020   Radiculopathy 11/24/2016   H/O colon cancer, stage I 01/19/2013   Nonspecific abnormal electrocardiogram (ECG) (EKG) 03/31/2011   Chest pain 03/31/2011   Hyperlipidemia 03/31/2011   Essential hypertension 03/31/2011   Tobacco abuse 03/31/2011    ONSET DATE: 07/12/2024 (referral date); stroke 05/12/2024  REFERRING DIAG: I69.398,R26.9 (ICD-10-CM) - Gait disturbance, post-stroke  THERAPY DIAG:  Muscle weakness (generalized)  Other abnormalities of gait and mobility  Unsteadiness on feet  Other lack of coordination  Repeated falls  Cerebrovascular accident (CVA) due to embolism of right middle cerebral artery (HCC)  Rationale for Evaluation and Treatment: Rehabilitation  SUBJECTIVE:                                                                                                                                                                                             SUBJECTIVE STATEMENT:  Pt denies any falls since last visit. Pt denies any other acute changes since last visit.  Pt accompanied by: self, drives herself  PERTINENT HISTORY: PMH: tobacco abuse, squamous cell carcinoma of the lung (newly diagnosed 04/2024), coronary artery calcification, migraines, COPD, chronic bronchitis, colon cancer, hypertension, hyperlipidemia, type 2 diabetes, thyroid  nodule, arthritis, migraines, anxiety, depression, and recent stroke.  PAIN:  Are you having pain? No  PRECAUTIONS: Fall and Other: loop recorder  RED FLAGS: None   WEIGHT BEARING RESTRICTIONS: No  FALLS: Has patient fallen in last 6 months? Yes. Number of falls 2  falls since October, was able to get back up herself, no injuries  LIVING ENVIRONMENT: Lives with: lives with their partner; she is caregiver for her partner - MS Lives in: House/apartment Stairs: Yes: Internal: 14 steps; on right going up (except for a short distance across a platform - is working  on getting a rail installed here) Has following equipment at home: Single point cane and Walker - 2 wheeled  PLOF: Independent with gait, Independent with transfers, and Requires assistive device for independence  PATIENT GOALS: to get a lot more strength in my legs, I felt imbalanced prior to the stroke get my balance back so I don't have to use a cane  OBJECTIVE:  Note: Objective measures were completed at Evaluation unless otherwise noted.  DIAGNOSTIC FINDINGS:  Brain MRI 05/12/2024 IMPRESSION: 1. Widely scattered small Acute infarcts in the right MCA and right MCA/PCA watershed territories. 2. Cytotoxic edema and scattered acute petechial hemorrhage (right parietal and occipital). No malignant hemorrhagic transformation. No intracranial mass effect. 3. Underlying chronic white matter disease.  COGNITION: Overall cognitive status: has some trouble with word-finding at times   SENSATION: N/T in L leg down into foot up to about her knee Mild sensory impairments in RLE per pt report  COORDINATION: Impaired in L hemibody  EDEMA:  Mild in [name]   POSTURE: rounded shoulders, forward head, and posterior pelvic tilt  LOWER EXTREMITY ROM:   WFL  Active  Right Eval Left Eval  Hip flexion    Hip extension    Hip abduction    Hip adduction    Hip internal rotation    Hip external rotation    Knee flexion    Knee extension    Ankle dorsiflexion    Ankle plantarflexion    Ankle inversion    Ankle eversion     (Blank rows = not tested)  LOWER EXTREMITY MMT:    MMT Right Eval Left Eval  Hip flexion 4+ 3+  Hip extension    Hip abduction    Hip adduction    Hip  internal rotation    Hip external rotation    Knee flexion 4+ 3+  Knee extension 4+ 3+  Ankle dorsiflexion 4+ 4  Ankle plantarflexion    Ankle inversion    Ankle eversion    (Blank rows = not tested)  BED MOBILITY:  Sleeping in chair currently as her bed is upstairs; may have to get a bedrail when she gets back to her bed, her bed is tall off the floor  TRANSFERS: Sit to stand: Modified independence  Assistive device utilized: None     Stand to sit: Modified independence  Assistive device utilized: None     Chair to chair: Modified independence  Assistive device utilized: None       STAIRS: STAIRS:  Level of Assistance: SBA  Stair Negotiation Technique: Step to Pattern with Bilateral Rails  Number of Stairs: 6   Height of Stairs: 4  Comments: step to gait pattern, slow and cautious  GAIT: Findings:  Gait pattern: path deviation - weaving and deviation to the L, decreased hip/knee flexion- Left, and decreased ankle dorsiflexion- Left Distance walked: various clinic distances Assistive device utilized: Single point cane Level of assistance: SBA   FUNCTIONAL TESTS:    VITALS  Vitals:   08/20/24 1315 08/20/24 1318  BP: (!) 145/92 (!) 144/95  Pulse: 88 83  TREATMENT DATE:    Self-Care/Home Management Assessed in [name] while seated at rest (see above) and BP more elevated this date than previous 2 sessions. Pt did take her BP medication today, monitored patient during session due to elevated BP this date.   TherAct Practiced floor to mat transfer and how to safely help house mate if she has a fall. Pt able to don/doff gait belt on therapist to simulate putting a gait belt on her house mate. Discussed and demonstrated safe transfer techniques and how her house mate should be trying to get into half-kneel position then pushing up from a sturdy  surface (couch, bed) and then Mavi can try to help lift her from there. Educated patient that if she is not feeling stable or she is not feeling safe to help her house mat with a transfer up from the floor then they should call EMS.  For improved stepping strategy and reactive balance: Resisted gait x 230 ft with red band with steady resistance Fatiguing for patient Resisted gait 2 x 230 ft with red band with multidirectional perturbations Initially with cross over stepping strategy with progression to stepping out strategy Tends to utilize cross over stepping strategy with LOB to the R   Grip Strength Assessment: R hand L hand  32.1 lbs 35.2 lbs  33.5 lbs 36.1 lbs  35.9 lbs 34.6 lbs  Avg: 33.8 lbs Avg 35.3 lbs   Educated patient that her grip strength is decreased in B hands. Encouraged her to think about OT to work on grip strength and increased independence with ADLs and feeling more comfortable with shower transfers. Pt to think about if she wants OT or not and will let PT know.  PATIENT EDUCATION: Education details: Continue HEP, importance of continuing to monitor BP at home, grip strength results and functional implications, benefits of OT Person educated: Patient Education method: Explanation and Demonstration Education comprehension: verbalized understanding, returned demonstration, and needs further education  HOME EXERCISE PROGRAM: Access Code: [email or web address] URL: [email or web address] Date: 08/13/2024 Prepared by: Waddell Southgate  Exercises - Staggered Sit-to-Stand  - 1 x daily - 7 x weekly - 3 sets - 10 reps  GOALS: Goals reviewed with patient? Yes  SHORT TERM GOALS: Target date: 09/04/2024   Pt will be independent with initial HEP for improved strength, balance, transfers and gait. Baseline: Goal status: INITIAL  2.  Pt will improve gait velocity to at least 2.5 ft/sec for improved gait efficiency and performance at SBA level  Baseline: 2.29 ft/sec SBA  no AD (1/6) Goal status: INITIAL  3.  Pt will improve FGA to 18/30 for decreased fall risk  Baseline: 14/30 (1/6) Goal status: INITIAL  4.  Pt will be able to navigate up/down a flight of stairs with one handrail at mod I level with step-to gait pattern Baseline:  Goal status: INITIAL   LONG TERM GOALS: Target date: 10/02/2024   Pt will be independent with final HEP for improved strength, balance, transfers and gait. Baseline:  Goal status: INITIAL  2.  Pt will improve gait velocity to at least 2.75 ft/sec for improved gait efficiency and performance at mod I level  Baseline: 2.29 ft/sec SBA no AD (1/6) Goal status: INITIAL  3.  Pt will improve FGA to 22/30 for decreased fall risk  Baseline: 14/30 (1/6) Goal status: INITIAL  4.  Pt will be able to navigate up/down a flight of stairs with one handrail at mod I level with alternating gait pattern Baseline:  Goal status:  INITIAL  5.  mCTSIB to be assessed and LTG set Baseline: 120/120 (1/12) Goal status: D/C GOAL due to high score    ASSESSMENT:  CLINICAL IMPRESSION: Emphasis of skilled PT session on continuing to assess vitals, assessing grip strength, working on simulation of helping her house mate back up from the floor after a fall, and working on balance reactions and stepping strategy. Pt's BP more elevated this date compared to previous sessions despite her taking her medication, encouraged her to continue checking it at home. Also, she exhibits decreased grip strength in B hands, encouraged her to think about starting OT to work on this. She exhibits good understanding of how to safely assist her house mat with a transfer back up from the floor but encouraged her to call EMS if she herself is not feeling steady or strong enough at the time. Finally, she tends to utilize cross over stepping strategy for balance recovery with improved ability to utilize stepping out strategy as task progresses but can benefit from further  practice of this. Continue POC.    OBJECTIVE IMPAIRMENTS: Abnormal gait, decreased activity tolerance, decreased balance, decreased cognition, decreased coordination, decreased endurance, difficulty walking, decreased strength, impaired sensation, impaired UE functional use, impaired vision/preception, and postural dysfunction.   ACTIVITY LIMITATIONS: carrying, lifting, bending, squatting, stairs, bed mobility, and caring for others  PARTICIPATION LIMITATIONS: cleaning, laundry, and community activity  PERSONAL FACTORS: Fitness and 3+ comorbidities:   tobacco abuse, squamous cell carcinoma of the lung (newly diagnosed 04/2024), coronary artery calcification, migraines, COPD, chronic bronchitis, colon cancer, hypertension, hyperlipidemia, type 2 diabetes, thyroid  nodule, arthritis, migraines, anxiety, depression, and recent stroke.are also affecting patient's functional outcome.   REHAB POTENTIAL: Good  CLINICAL DECISION MAKING: Evolving/moderate complexity  EVALUATION COMPLEXITY: Moderate  PLAN:  PT FREQUENCY: 2x/week  PT DURATION: 8 weeks  PLANNED INTERVENTIONS: [id number]- PT Re-evaluation, 97750- Physical Performance Testing, 97110-Therapeutic exercises, 97530- Therapeutic activity, 97112- Neuromuscular re-education, 97535- Self Care, [id number]- Manual therapy, [phone]- Gait training, [phone]- Orthotic Initial, [phone]- Orthotic/Prosthetic subsequent, Patient/Family education, Balance training, Stair training, Taping, Joint mobilization, Visual/preceptual remediation/compensation, Cognitive remediation, DME instructions, Cryotherapy, and Moist heat  PLAN FOR NEXT SESSION: CHECK BP, does patient want OT?, add to HEP as needed to work on [name] NMR/strengthening and balance impairments; floor transfers, stairs, stepping/ankle strategy    Waddell Southgate, PT Waddell Southgate, PT, DPT, CSRS  08/20/2024, 2:03 PM        "

## 2024-08-22 ENCOUNTER — Ambulatory Visit

## 2024-08-22 DIAGNOSIS — M216X2 Other acquired deformities of left foot: Secondary | ICD-10-CM

## 2024-08-22 DIAGNOSIS — M2141 Flat foot [pes planus] (acquired), right foot: Secondary | ICD-10-CM

## 2024-08-22 DIAGNOSIS — M216X1 Other acquired deformities of right foot: Secondary | ICD-10-CM

## 2024-08-22 DIAGNOSIS — M2142 Flat foot [pes planus] (acquired), left foot: Secondary | ICD-10-CM

## 2024-08-22 NOTE — Progress Notes (Signed)
 [name] presents today to pick up her orthotics.  The orthotics fit well overall but are too short for her shoes-  I am having her try these out but am ordering her a longer pair that will fit better in her shoes.  I will see her when these are ready for pick up and will swap out inserts with her at that time.  [name] is understanding of this and knows she will need to come back in for fitting of the re made pair.  I will charge for orthotics when the correct size is dispensed for her.

## 2024-08-23 ENCOUNTER — Ambulatory Visit

## 2024-08-23 ENCOUNTER — Ambulatory Visit: Admitting: Physical Therapy

## 2024-08-23 ENCOUNTER — Telehealth: Payer: Self-pay | Admitting: Physical Therapy

## 2024-08-23 VITALS — BP 147/89 | HR 96

## 2024-08-23 DIAGNOSIS — R296 Repeated falls: Secondary | ICD-10-CM

## 2024-08-23 DIAGNOSIS — I639 Cerebral infarction, unspecified: Secondary | ICD-10-CM

## 2024-08-23 DIAGNOSIS — M6281 Muscle weakness (generalized): Secondary | ICD-10-CM

## 2024-08-23 DIAGNOSIS — R278 Other lack of coordination: Secondary | ICD-10-CM

## 2024-08-23 DIAGNOSIS — R2681 Unsteadiness on feet: Secondary | ICD-10-CM

## 2024-08-23 DIAGNOSIS — I63411 Cerebral infarction due to embolism of right middle cerebral artery: Secondary | ICD-10-CM

## 2024-08-23 DIAGNOSIS — R2689 Other abnormalities of gait and mobility: Secondary | ICD-10-CM

## 2024-08-23 NOTE — Therapy (Signed)
 " OUTPATIENT PHYSICAL THERAPY NEURO TREATMENT   Patient Name: [name] MRN: [id number] DOB:1950-09-22, 74 y.o., female Today's Date: 08/23/2024   PCP: Dyane Laurette RAMAN, FNP REFERRING PROVIDER: Carilyn Prentice BRAVO, MD  END OF SESSION:  PT End of Session - 08/23/24 1403     Visit Number 5    Number of Visits 17   with eval   Date for Recertification  10/16/24   to allow for scheduling delays   Authorization Type [employer] Medicare    PT Start Time 1402    PT Stop Time 1447    PT Time Calculation (min) 45 min    Equipment Utilized During Treatment Gait belt    Activity Tolerance Patient tolerated treatment well    Behavior During Therapy WFL for tasks assessed/performed              Past Medical History:  Diagnosis Date   Alcohol abuse, in remission    Anxiety    Cancer of sigmoid colon (HCC) 07/2005   T2N0M0   Chronic bronchitis    COPD (chronic obstructive pulmonary disease) (HCC)    Depression    Diabetes mellitus without complication (HCC)    GERD (gastroesophageal reflux disease)    History of thyroid  nodule    monitoring   Hyperlipidemia    Hypertension    Migraines    OA (osteoarthritis)    PONV (postoperative nausea and vomiting)    Reflux    Past Surgical History:  Procedure Laterality Date   ABDOMINAL EXPOSURE N/A 11/24/2016   Procedure: ABDOMINAL EXPOSURE;  Surgeon: Krystal JULIANNA Doing, MD;  Location: [facility] OR;  Service: Vascular;  Laterality: N/A;   ABDOMINAL HYSTERECTOMY  1992   age 47 and appendics   ANTERIOR LUMBAR FUSION Bilateral 11/24/2016   Procedure: LUMBAR 5-SACRUM 1 ANTERIOR LUMBAR INTERBODY FUSION;  Surgeon: Oneil Priestly, MD;  Location: [facility] OR;  Service: Orthopedics;  Laterality: Bilateral;  LUMBAR 5-SACRUM 1 ANTERIOR LUMBAR INTERBODY FUSION    ARTHRODESIS METATARSALPHALANGEAL JOINT (MTPJ) Left 08/15/2023   Procedure: ARTHRODESIS METATARSALPHALANGEAL JOINT (MTPJ);  Surgeon: Tobie Franky SQUIBB, DPM;  Location: [facility] ORS;  Service: Orthopedics/Podiatry;   Laterality: Left;  POPLITEAL/SAPHENOUS BLOCK   bilateral breast surgery      benign tumors removed   CHOLECYSTECTOMY  2007   COLON RESECTION  2006   COLONOSCOPY  2019   HAND SURGERY  2010   LOOP RECORDER INSERTION N/A 05/17/2024   Procedure: LOOP RECORDER INSERTION;  Surgeon: Inocencio Soyla Lunger, MD;  Location: [facility] INVASIVE CV LAB;  Service: Cardiovascular;  Laterality: N/A;   repair of incisional hernia  2012   SHOULDER ARTHROSCOPY Left 2009   SHOULDER SURGERY Right 1997   rotator cuff   TONSILLECTOMY     as a child   TOTAL KNEE ARTHROPLASTY Left 08/11/2021   Procedure: TOTAL KNEE ARTHROPLASTY;  Surgeon: Beverley Evalene JONETTA, MD;  Location: [facility] ORS;  Service: Orthopedics;  Laterality: Left;   TOTAL KNEE ARTHROPLASTY Right 06/29/2022   Procedure: TOTAL KNEE ARTHROPLASTY;  Surgeon: Beverley Evalene JONETTA, MD;  Location: [facility] ORS;  Service: Orthopedics;  Laterality: Right;   VIDEO BRONCHOSCOPY WITH ENDOBRONCHIAL NAVIGATION N/A 04/23/2024   Procedure: VIDEO BRONCHOSCOPY WITH ENDOBRONCHIAL NAVIGATION;  Surgeon: Shelah Lamar RAMAN, MD;  Location: [facility] ENDOSCOPY;  Service: Pulmonary;  Laterality: N/A;  lingular nodule   Patient Active Problem List   Diagnosis Date Noted   Depression with anxiety 05/21/2024   CVA (cerebral vascular accident) (HCC) 05/17/2024   Acute CVA (cerebrovascular accident) (HCC) 05/12/2024  Gastroesophageal reflux disease 05/11/2024   Drug-induced myopathy 05/11/2024   Diabetic foot ulcer (HCC) 05/11/2024   Benign essential hypertension 05/11/2024   Major depressive disorder, recurrent, moderate (HCC) 05/11/2024   Malignant neoplasm of lung (HCC) 05/11/2024   Thyroid  nodule 05/11/2024   Panic disorder 05/11/2024   Migraine without aura and responsive to treatment 05/11/2024   Recurrent falls 05/11/2024   Stage 2 chronic kidney disease 05/11/2024   Poorly controlled type 2 diabetes mellitus (HCC) 05/11/2024   Acute hypoxemic respiratory failure (HCC) 05/11/2024   Acute  left-sided weakness 05/11/2024   AKI (acute kidney injury) 05/11/2024   Hypokalemia 05/11/2024   Pulmonary nodule 1 cm or greater in diameter 04/23/2024   Lung nodules 04/11/2024   S/P total knee arthroplasty, right 06/29/2022   Multinodular goiter 06/11/2021   Recurrent ventral incisional hernia 06/11/2021   Centrilobular emphysema (HCC) 12/13/2020   Radiculopathy 11/24/2016   H/O colon cancer, stage I 01/19/2013   Nonspecific abnormal electrocardiogram (ECG) (EKG) 03/31/2011   Chest pain 03/31/2011   Hyperlipidemia 03/31/2011   Essential hypertension 03/31/2011   Tobacco abuse 03/31/2011    ONSET DATE: 07/12/2024 (referral date); stroke 05/12/2024  REFERRING DIAG: I69.398,R26.9 (ICD-10-CM) - Gait disturbance, post-stroke  THERAPY DIAG:  Muscle weakness (generalized)  Other abnormalities of gait and mobility  Unsteadiness on feet  Other lack of coordination  Repeated falls  Rationale for Evaluation and Treatment: Rehabilitation  SUBJECTIVE:                                                                                                                                                                                             SUBJECTIVE STATEMENT:  Pt denies any falls since last visit. Feels more dizzy today. HEP is going well. Wearing her new orthotics, they are too short but they feel good. Will be getting longer ones soon   Pt accompanied by: self, drives herself  PERTINENT HISTORY: PMH: tobacco abuse, squamous cell carcinoma of the lung (newly diagnosed 04/2024), coronary artery calcification, migraines, COPD, chronic bronchitis, colon cancer, hypertension, hyperlipidemia, type 2 diabetes, thyroid  nodule, arthritis, migraines, anxiety, depression, and recent stroke.  PAIN:  Are you having pain? No  PRECAUTIONS: Fall and Other: loop recorder  RED FLAGS: None   WEIGHT BEARING RESTRICTIONS: No  FALLS: Has patient fallen in last 6 months? Yes. Number of falls 2  falls since October, was able to get back up herself, no injuries  LIVING ENVIRONMENT: Lives with: lives with their partner; she is caregiver for her partner - MS Lives in: House/apartment Stairs: Yes: Internal: 14 steps; on right going up (except for a short distance across  a platform - is working on getting a rail installed here) Has following equipment at home: Single point cane and Environmental Consultant - 2 wheeled  PLOF: Independent with gait, Independent with transfers, and Requires assistive device for independence  PATIENT GOALS: to get a lot more strength in my legs, I felt imbalanced prior to the stroke get my balance back so I don't have to use a cane  OBJECTIVE:  Note: Objective measures were completed at Evaluation unless otherwise noted.  DIAGNOSTIC FINDINGS:  Brain MRI 05/12/2024 IMPRESSION: 1. Widely scattered small Acute infarcts in the right MCA and right MCA/PCA watershed territories. 2. Cytotoxic edema and scattered acute petechial hemorrhage (right parietal and occipital). No malignant hemorrhagic transformation. No intracranial mass effect. 3. Underlying chronic white matter disease.  COGNITION: Overall cognitive status: has some trouble with word-finding at times   SENSATION: N/T in L leg down into foot up to about her knee Mild sensory impairments in RLE per pt report  COORDINATION: Impaired in L hemibody  EDEMA:  Mild in [name]   POSTURE: rounded shoulders, forward head, and posterior pelvic tilt  LOWER EXTREMITY ROM:   WFL  Active  Right Eval Left Eval  Hip flexion    Hip extension    Hip abduction    Hip adduction    Hip internal rotation    Hip external rotation    Knee flexion    Knee extension    Ankle dorsiflexion    Ankle plantarflexion    Ankle inversion    Ankle eversion     (Blank rows = not tested)  LOWER EXTREMITY MMT:    MMT Right Eval Left Eval  Hip flexion 4+ 3+  Hip extension    Hip abduction    Hip adduction    Hip  internal rotation    Hip external rotation    Knee flexion 4+ 3+  Knee extension 4+ 3+  Ankle dorsiflexion 4+ 4  Ankle plantarflexion    Ankle inversion    Ankle eversion    (Blank rows = not tested)  BED MOBILITY:  Sleeping in chair currently as her bed is upstairs; may have to get a bedrail when she gets back to her bed, her bed is tall off the floor  TRANSFERS: Sit to stand: Modified independence  Assistive device utilized: None     Stand to sit: Modified independence  Assistive device utilized: None     Chair to chair: Modified independence  Assistive device utilized: None       STAIRS: STAIRS:  Level of Assistance: SBA  Stair Negotiation Technique: Step to Pattern with Bilateral Rails  Number of Stairs: 6   Height of Stairs: 4  Comments: step to gait pattern, slow and cautious  GAIT: Findings:  Gait pattern: path deviation - weaving and deviation to the L, decreased hip/knee flexion- Left, and decreased ankle dorsiflexion- Left Distance walked: various clinic distances Assistive device utilized: Single point cane Level of assistance: SBA   FUNCTIONAL TESTS:    VITALS  Vitals:   08/23/24 1406  BP: (!) 147/89  Pulse: 96  TREATMENT DATE:    Self-Care/Home Management Assessed in [name] while seated at rest (see above) and BP WNL.   TherAct/NMR For improved grip strength, functional hip strength and stability w/lifting:  Suitcase carries w/20# and 25# KB, one in each hand, x115'. Increased instability to L side. Pt required standing rest break ~80' in due to SOB.  Deadlifts w/25# KB, x15 reps. Pt demonstrated good body mechanics without cues   Slam ball cleans over the shoulder w/10# ball, x18 reps. Min cues to use hips to get weight over shoulder and pt reports feeling more stable when using hips.  At stairway, 5 Blaze pods on random  reach setting for improved LE coordination, step clearance and single leg stability.  Performed on 2 minute intervals with 3-5 minute seated rest periods.  Pt requires CGA-min A guarding. Round 1:  3 pods placed on second step and 2 pods placed on first step.  45 hits. Round 2:  same setup.  49 hits. Notable errors/deficits:  Intermittent UE support required to regain balance. Decreased step clearance noted w/RLE, as pt frequently tripping over first step  SciFit multi-peaks level 5.5 for 8 minutes using BUE/BLEs for neural priming for reciprocal movement, dynamic cardiovascular conditioning and increased amplitude of stepping. RPE of 4-5/10 following activity     PATIENT EDUCATION: Education details: Continue HEP, plan to obtain OT referral  Person educated: Patient Education method: Explanation and Demonstration Education comprehension: verbalized understanding, returned demonstration, and needs further education  HOME EXERCISE PROGRAM: Access Code: [email or web address] URL: [email or web address] Date: 08/13/2024 Prepared by: Waddell Southgate  Exercises - Staggered Sit-to-Stand  - 1 x daily - 7 x weekly - 3 sets - 10 reps  GOALS: Goals reviewed with patient? Yes  SHORT TERM GOALS: Target date: 09/04/2024   Pt will be independent with initial HEP for improved strength, balance, transfers and gait. Baseline: Goal status: INITIAL  2.  Pt will improve gait velocity to at least 2.5 ft/sec for improved gait efficiency and performance at SBA level  Baseline: 2.29 ft/sec SBA no AD (1/6) Goal status: INITIAL  3.  Pt will improve FGA to 18/30 for decreased fall risk  Baseline: 14/30 (1/6) Goal status: INITIAL  4.  Pt will be able to navigate up/down a flight of stairs with one handrail at mod I level with step-to gait pattern Baseline:  Goal status: INITIAL   LONG TERM GOALS: Target date: 10/02/2024   Pt will be independent with final HEP for improved strength, balance, transfers  and gait. Baseline:  Goal status: INITIAL  2.  Pt will improve gait velocity to at least 2.75 ft/sec for improved gait efficiency and performance at mod I level  Baseline: 2.29 ft/sec SBA no AD (1/6) Goal status: INITIAL  3.  Pt will improve FGA to 22/30 for decreased fall risk  Baseline: 14/30 (1/6) Goal status: INITIAL  4.  Pt will be able to navigate up/down a flight of stairs with one handrail at mod I level with alternating gait pattern Baseline:  Goal status: INITIAL  5.  mCTSIB to be assessed and LTG set Baseline: 120/120 (1/12) Goal status: D/C GOAL due to high score    ASSESSMENT:  CLINICAL IMPRESSION: Emphasis of skilled PT session on proper lifting technique, posterior chain strength, LE coordination and single leg stability. Pt reported increased dizziness at beginning of session but BP WNL. Pt required cues to use power of hip extension to assist w/lifting heavy objects, as she tends to strain BUEs. With cues to  use hips, pt reported feeling more stable and less stress on low back. Pt frequently tripping over RLE during blaze pod activity due to decreased single leg stability on [name]. Continue POC.   OBJECTIVE IMPAIRMENTS: Abnormal gait, decreased activity tolerance, decreased balance, decreased cognition, decreased coordination, decreased endurance, difficulty walking, decreased strength, impaired sensation, impaired UE functional use, impaired vision/preception, and postural dysfunction.   ACTIVITY LIMITATIONS: carrying, lifting, bending, squatting, stairs, bed mobility, and caring for others  PARTICIPATION LIMITATIONS: cleaning, laundry, and community activity  PERSONAL FACTORS: Fitness and 3+ comorbidities:   tobacco abuse, squamous cell carcinoma of the lung (newly diagnosed 04/2024), coronary artery calcification, migraines, COPD, chronic bronchitis, colon cancer, hypertension, hyperlipidemia, type 2 diabetes, thyroid  nodule, arthritis, migraines, anxiety,  depression, and recent stroke.are also affecting patient's functional outcome.   REHAB POTENTIAL: Good  CLINICAL DECISION MAKING: Evolving/moderate complexity  EVALUATION COMPLEXITY: Moderate  PLAN:  PT FREQUENCY: 2x/week  PT DURATION: 8 weeks  PLANNED INTERVENTIONS: 97164- PT Re-evaluation, 97750- Physical Performance Testing, 97110-Therapeutic exercises, 97530- Therapeutic activity, 97112- Neuromuscular re-education, 97535- Self Care, [id number]- Manual therapy, [phone]- Gait training, [phone]- Orthotic Initial, [phone]- Orthotic/Prosthetic subsequent, Patient/Family education, Balance training, Stair training, Taping, Joint mobilization, Visual/preceptual remediation/compensation, Cognitive remediation, DME instructions, Cryotherapy, and Moist heat  PLAN FOR NEXT SESSION: CHECK BP, did we get OT order? add to HEP as needed to work on [name] NMR/strengthening and balance impairments; floor transfers, stairs, stepping/ankle strategy    Yovani Cogburn E Anaaya Fuster, PT, DPT  08/23/2024, 2:47 PM        "

## 2024-08-23 NOTE — Telephone Encounter (Signed)
 Dr. Carilyn,  Larrie D Zettlemoyer  is being seen by PT for CVA and would benefit from an OT evaluation for BUE weakness.    If you agree, please place an order in [facility]que in [facility] or fax the order to [phone].  Thank you,  Marlon FORBES Dural, PT, DPT [facility] [address] [place], [place]  [address] Phone:  [phone] Fax:  [phone]

## 2024-08-24 ENCOUNTER — Ambulatory Visit: Payer: Self-pay | Admitting: Cardiology

## 2024-08-24 LAB — CUP PACEART REMOTE DEVICE CHECK
Date Time Interrogation Session: 20260122153910
Implantable Pulse Generator Implant Date: 20251016

## 2024-08-25 NOTE — Progress Notes (Signed)
 Remote Loop Recorder Transmission

## 2024-08-27 ENCOUNTER — Ambulatory Visit: Admitting: Physical Therapy

## 2024-08-30 ENCOUNTER — Ambulatory Visit: Admitting: Physical Therapy

## 2024-08-30 NOTE — Therapy (Incomplete)
 " OUTPATIENT PHYSICAL THERAPY NEURO TREATMENT   Patient Name: [name] MRN: [id number] DOB:[birth year], 74 y.o., female Today's Date: 08/30/2024   PCP: Dyane Laurette RAMAN, FNP REFERRING PROVIDER: Carilyn Prentice BRAVO, MD  END OF SESSION:        Past Medical History:  Diagnosis Date   Alcohol abuse, in remission    Anxiety    Cancer of sigmoid colon (HCC) 07/2005   T2N0M0   Chronic bronchitis    COPD (chronic obstructive pulmonary disease) (HCC)    Depression    Diabetes mellitus without complication (HCC)    GERD (gastroesophageal reflux disease)    History of thyroid  nodule    monitoring   Hyperlipidemia    Hypertension    Migraines    OA (osteoarthritis)    PONV (postoperative nausea and vomiting)    Reflux    Past Surgical History:  Procedure Laterality Date   ABDOMINAL EXPOSURE N/A 11/24/2016   Procedure: ABDOMINAL EXPOSURE;  Surgeon: Krystal JULIANNA Doing, MD;  Location: [facility] OR;  Service: Vascular;  Laterality: N/A;   ABDOMINAL HYSTERECTOMY  1992   age 57 and appendics   ANTERIOR LUMBAR FUSION Bilateral 11/24/2016   Procedure: LUMBAR 5-SACRUM 1 ANTERIOR LUMBAR INTERBODY FUSION;  Surgeon: Oneil Priestly, MD;  Location: [facility] OR;  Service: Orthopedics;  Laterality: Bilateral;  LUMBAR 5-SACRUM 1 ANTERIOR LUMBAR INTERBODY FUSION    ARTHRODESIS METATARSALPHALANGEAL JOINT (MTPJ) Left 08/15/2023   Procedure: ARTHRODESIS METATARSALPHALANGEAL JOINT (MTPJ);  Surgeon: Tobie Franky SQUIBB, DPM;  Location: [facility] ORS;  Service: Orthopedics/Podiatry;  Laterality: Left;  POPLITEAL/SAPHENOUS BLOCK   bilateral breast surgery      benign tumors removed   CHOLECYSTECTOMY  2007   COLON RESECTION  2006   COLONOSCOPY  2019   HAND SURGERY  2010   LOOP RECORDER INSERTION N/A 05/17/2024   Procedure: LOOP RECORDER INSERTION;  Surgeon: Inocencio Soyla Lunger, MD;  Location: [facility] INVASIVE CV LAB;  Service: Cardiovascular;  Laterality: N/A;   repair of incisional hernia  2012   SHOULDER ARTHROSCOPY Left 2009    SHOULDER SURGERY Right 1997   rotator cuff   TONSILLECTOMY     as a child   TOTAL KNEE ARTHROPLASTY Left 08/11/2021   Procedure: TOTAL KNEE ARTHROPLASTY;  Surgeon: Beverley Evalene JONETTA, MD;  Location: [facility] ORS;  Service: Orthopedics;  Laterality: Left;   TOTAL KNEE ARTHROPLASTY Right 06/29/2022   Procedure: TOTAL KNEE ARTHROPLASTY;  Surgeon: Beverley Evalene JONETTA, MD;  Location: [facility] ORS;  Service: Orthopedics;  Laterality: Right;   VIDEO BRONCHOSCOPY WITH ENDOBRONCHIAL NAVIGATION N/A 04/23/2024   Procedure: VIDEO BRONCHOSCOPY WITH ENDOBRONCHIAL NAVIGATION;  Surgeon: Shelah Lamar RAMAN, MD;  Location: [facility] ENDOSCOPY;  Service: Pulmonary;  Laterality: N/A;  lingular nodule   Patient Active Problem List   Diagnosis Date Noted   Depression with anxiety 05/21/2024   CVA (cerebral vascular accident) (HCC) 05/17/2024   Acute CVA (cerebrovascular accident) (HCC) 05/12/2024   Gastroesophageal reflux disease 05/11/2024   Drug-induced myopathy 05/11/2024   Diabetic foot ulcer (HCC) 05/11/2024   Benign essential hypertension 05/11/2024   Major depressive disorder, recurrent, moderate (HCC) 05/11/2024   Malignant neoplasm of lung (HCC) 05/11/2024   Thyroid  nodule 05/11/2024   Panic disorder 05/11/2024   Migraine without aura and responsive to treatment 05/11/2024   Recurrent falls 05/11/2024   Stage 2 chronic kidney disease 05/11/2024   Poorly controlled type 2 diabetes mellitus (HCC) 05/11/2024   Acute hypoxemic respiratory failure (HCC) 05/11/2024   Acute left-sided weakness 05/11/2024   AKI (acute kidney  injury) 05/11/2024   Hypokalemia 05/11/2024   Pulmonary nodule 1 cm or greater in diameter 04/23/2024   Lung nodules 04/11/2024   S/P total knee arthroplasty, right 06/29/2022   Multinodular goiter 06/11/2021   Recurrent ventral incisional hernia 06/11/2021   Centrilobular emphysema (HCC) 12/13/2020   Radiculopathy 11/24/2016   H/O colon cancer, stage I 01/19/2013   Nonspecific abnormal  electrocardiogram (ECG) (EKG) 03/31/2011   Chest pain 03/31/2011   Hyperlipidemia 03/31/2011   Essential hypertension 03/31/2011   Tobacco abuse 03/31/2011    ONSET DATE: 07/12/2024 (referral date); stroke 05/12/2024  REFERRING DIAG: I69.398,R26.9 (ICD-10-CM) - Gait disturbance, post-stroke  THERAPY DIAG:  No diagnosis found.  Rationale for Evaluation and Treatment: Rehabilitation  SUBJECTIVE:                                                                                                                                                                                             SUBJECTIVE STATEMENT:  Pt denies any falls since last visit. Feels more dizzy today. HEP is going well. Wearing her new orthotics, they are too short but they feel good. Will be getting longer ones soon   ***  Pt accompanied by: self, drives herself  PERTINENT HISTORY: PMH: tobacco abuse, squamous cell carcinoma of the lung (newly diagnosed 04/2024), coronary artery calcification, migraines, COPD, chronic bronchitis, colon cancer, hypertension, hyperlipidemia, type 2 diabetes, thyroid  nodule, arthritis, migraines, anxiety, depression, and recent stroke.  PAIN:  Are you having pain? No  PRECAUTIONS: Fall and Other: loop recorder  RED FLAGS: None   WEIGHT BEARING RESTRICTIONS: No  FALLS: Has patient fallen in last 6 months? Yes. Number of falls 2 falls since October, was able to get back up herself, no injuries  LIVING ENVIRONMENT: Lives with: lives with their partner; she is caregiver for her partner - MS Lives in: House/apartment Stairs: Yes: Internal: 14 steps; on right going up (except for a short distance across a platform - is working on getting a rail installed here) Has following equipment at home: Single point cane and Environmental Consultant - 2 wheeled  PLOF: Independent with gait, Independent with transfers, and Requires assistive device for independence  PATIENT GOALS: to get a lot more strength in my  legs, I felt imbalanced prior to the stroke get my balance back so I don't have to use a cane  OBJECTIVE:  Note: Objective measures were completed at Evaluation unless otherwise noted.  DIAGNOSTIC FINDINGS:  Brain MRI 05/12/2024 IMPRESSION: 1. Widely scattered small Acute infarcts in the right MCA and right MCA/PCA watershed territories. 2. Cytotoxic edema and scattered acute petechial hemorrhage (right parietal and occipital). No  malignant hemorrhagic transformation. No intracranial mass effect. 3. Underlying chronic white matter disease.  COGNITION: Overall cognitive status: has some trouble with word-finding at times   SENSATION: N/T in L leg down into foot up to about her knee Mild sensory impairments in RLE per pt report  COORDINATION: Impaired in L hemibody  EDEMA:  Mild in [name]   POSTURE: rounded shoulders, forward head, and posterior pelvic tilt  LOWER EXTREMITY ROM:   WFL  Active  Right Eval Left Eval  Hip flexion    Hip extension    Hip abduction    Hip adduction    Hip internal rotation    Hip external rotation    Knee flexion    Knee extension    Ankle dorsiflexion    Ankle plantarflexion    Ankle inversion    Ankle eversion     (Blank rows = not tested)  LOWER EXTREMITY MMT:    MMT Right Eval Left Eval  Hip flexion 4+ 3+  Hip extension    Hip abduction    Hip adduction    Hip internal rotation    Hip external rotation    Knee flexion 4+ 3+  Knee extension 4+ 3+  Ankle dorsiflexion 4+ 4  Ankle plantarflexion    Ankle inversion    Ankle eversion    (Blank rows = not tested)  BED MOBILITY:  Sleeping in chair currently as her bed is upstairs; may have to get a bedrail when she gets back to her bed, her bed is tall off the floor  TRANSFERS: Sit to stand: Modified independence  Assistive device utilized: None     Stand to sit: Modified independence  Assistive device utilized: None     Chair to chair: Modified independence  Assistive  device utilized: None       STAIRS: STAIRS:  Level of Assistance: SBA  Stair Negotiation Technique: Step to Pattern with Bilateral Rails  Number of Stairs: 6   Height of Stairs: 4  Comments: step to gait pattern, slow and cautious  GAIT: Findings:  Gait pattern: path deviation - weaving and deviation to the L, decreased hip/knee flexion- Left, and decreased ankle dorsiflexion- Left Distance walked: various clinic distances Assistive device utilized: Single point cane Level of assistance: SBA   FUNCTIONAL TESTS:    VITALS  There were no vitals filed for this visit.                                                                                                                                 TREATMENT DATE:    Self-Care/Home Management Assessed in [name] while seated at rest (see above) and BP WNL.  ***  TherAct/NMR For improved grip strength, functional hip strength and stability w/lifting:  Suitcase carries w/20# and 25# KB, one in each hand, x115'. Increased instability to L side. Pt required standing rest break ~80' in due to SOB.  Deadlifts w/25# KB, [id number]  reps. Pt demonstrated good body mechanics without cues   Slam ball cleans over the shoulder w/10# ball, x18 reps. Min cues to use hips to get weight over shoulder and pt reports feeling more stable when using hips.  At stairway, 5 Blaze pods on random reach setting for improved LE coordination, step clearance and single leg stability.  Performed on 2 minute intervals with 3-5 minute seated rest periods.  Pt requires CGA-min A guarding. Round 1:  3 pods placed on second step and 2 pods placed on first step.  45 hits. Round 2:  same setup.  49 hits. Notable errors/deficits:  Intermittent UE support required to regain balance. Decreased step clearance noted w/RLE, as pt frequently tripping over first step  SciFit multi-peaks level 5.5 for 8 minutes using BUE/BLEs for neural priming for reciprocal movement, dynamic  cardiovascular conditioning and increased amplitude of stepping. RPE of 4-5/10 following activity  ***    PATIENT EDUCATION: Education details: Continue HEP, plan to obtain OT referral *** Person educated: Patient Education method: Explanation and Demonstration Education comprehension: verbalized understanding, returned demonstration, and needs further education  HOME EXERCISE PROGRAM: Access Code: [email or web address] URL: [email or web address] Date: 08/13/2024 Prepared by: Waddell Southgate  Exercises - Staggered Sit-to-Stand  - 1 x daily - 7 x weekly - 3 sets - 10 reps  GOALS: Goals reviewed with patient? Yes  SHORT TERM GOALS: Target date: 09/04/2024   Pt will be independent with initial HEP for improved strength, balance, transfers and gait. Baseline: Goal status: INITIAL  2.  Pt will improve gait velocity to at least 2.5 ft/sec for improved gait efficiency and performance at SBA level  Baseline: 2.29 ft/sec SBA no AD (1/6) Goal status: INITIAL  3.  Pt will improve FGA to 18/30 for decreased fall risk  Baseline: 14/30 (1/6) Goal status: INITIAL  4.  Pt will be able to navigate up/down a flight of stairs with one handrail at mod I level with step-to gait pattern Baseline:  Goal status: INITIAL   LONG TERM GOALS: Target date: 10/02/2024   Pt will be independent with final HEP for improved strength, balance, transfers and gait. Baseline:  Goal status: INITIAL  2.  Pt will improve gait velocity to at least 2.75 ft/sec for improved gait efficiency and performance at mod I level  Baseline: 2.29 ft/sec SBA no AD (1/6) Goal status: INITIAL  3.  Pt will improve FGA to 22/30 for decreased fall risk  Baseline: 14/30 (1/6) Goal status: INITIAL  4.  Pt will be able to navigate up/down a flight of stairs with one handrail at mod I level with alternating gait pattern Baseline:  Goal status: INITIAL  5.  mCTSIB to be assessed and LTG set Baseline: 120/120 (1/12) Goal  status: D/C GOAL due to high score    ASSESSMENT:  CLINICAL IMPRESSION: Emphasis of skilled PT session on*** proper lifting technique, posterior chain strength, LE coordination and single leg stability. Pt reported increased dizziness at beginning of session but BP WNL. Pt required cues to use power of hip extension to assist w/lifting heavy objects, as she tends to strain BUEs. With cues to use hips, pt reported feeling more stable and less stress on low back. Pt frequently tripping over RLE during blaze pod activity due to decreased single leg stability on [name]. Continue POC.   OBJECTIVE IMPAIRMENTS: Abnormal gait, decreased activity tolerance, decreased balance, decreased cognition, decreased coordination, decreased endurance, difficulty walking, decreased strength, impaired sensation, impaired UE functional use, impaired vision/preception, and postural  dysfunction.   ACTIVITY LIMITATIONS: carrying, lifting, bending, squatting, stairs, bed mobility, and caring for others  PARTICIPATION LIMITATIONS: cleaning, laundry, and community activity  PERSONAL FACTORS: Fitness and 3+ comorbidities:   tobacco abuse, squamous cell carcinoma of the lung (newly diagnosed 04/2024), coronary artery calcification, migraines, COPD, chronic bronchitis, colon cancer, hypertension, hyperlipidemia, type 2 diabetes, thyroid  nodule, arthritis, migraines, anxiety, depression, and recent stroke.are also affecting patient's functional outcome.   REHAB POTENTIAL: Good  CLINICAL DECISION MAKING: Evolving/moderate complexity  EVALUATION COMPLEXITY: Moderate  PLAN:  PT FREQUENCY: 2x/week  PT DURATION: 8 weeks  PLANNED INTERVENTIONS: 97164- PT Re-evaluation, 97750- Physical Performance Testing, 97110-Therapeutic exercises, 97530- Therapeutic activity, 97112- Neuromuscular re-education, 97535- Self Care, 02859- Manual therapy, [phone]- Gait training, [phone]- Orthotic Initial, [phone]- Orthotic/Prosthetic subsequent,  Patient/Family education, Balance training, Stair training, Taping, Joint mobilization, Visual/preceptual remediation/compensation, Cognitive remediation, DME instructions, Cryotherapy, and Moist heat  PLAN FOR NEXT SESSION: CHECK BP, did we get OT order? add to HEP as needed to work on [name] NMR/strengthening and balance impairments; floor transfers, stairs, stepping/ankle strategy ***   Waddell Southgate, PT Waddell Southgate, PT, DPT, CSRS   08/30/2024, 7:54 AM        "

## 2024-08-31 NOTE — Progress Notes (Unsigned)
" °  Cardiology Office Note:  .   Date:  08/31/2024  ID:  [name], DOB 11/26/1950, MRN [id number] PCP: [name], FNP  [facility] Providers Cardiologist:  Darryle ONEIDA Decent, MD   History of Present Illness: .   No chief complaint on file.   [name] is a 74 y.o. female with below history who presents for follow-up.   History of Present Illness               Problem List 1. DM -A1c 7.4 2. HTN 3. HLD -T chol 101, HDL 45, LDL 23, TG 166 4. Tobacco abuse/COPD -minimal COPD -53 years  5. Coronary calcification on CT scan  -minimal LAD calcifications  6. Lung CA -Stage IA SCC L lung  7. CVA -05/2024 -multifocal - cardioembolic? -s/p ILR    ROS: All other ROS reviewed and negative. Pertinent positives noted in the HPI.     Studies Reviewed: SABRA       TTE 05/13/2024  1. Left ventricular ejection fraction, by estimation, is 60 to 65%. The  left ventricle has normal function. The left ventricle has no regional  wall motion abnormalities. There is mild concentric left ventricular  hypertrophy. Left ventricular diastolic  parameters are indeterminate.   2. Right ventricular systolic function is normal. The right ventricular  size is normal. Tricuspid regurgitation signal is inadequate for assessing  PA pressure.   3. The mitral valve is degenerative. Trivial mitral valve regurgitation.   4. The aortic valve is tricuspid. Aortic valve regurgitation is not  visualized. Aortic valve sclerosis/calcification is present, without any  evidence of aortic stenosis. Aortic valve mean gradient measures 3.0 mmHg.   5. The inferior vena cava is dilated in size with <50% respiratory  variability, suggesting right atrial pressure of 15 mmHg.  Physical Exam:   VS:  There were no vitals taken for this visit.   Wt Readings from Last 3 Encounters:  08/08/24 181 lb (82.1 kg)  08/06/24 178 lb 3.2 oz (80.8 kg)  07/12/24 177 lb 3.2 oz (80.4 kg)    GEN: Well  nourished, well developed in no acute distress NECK: No JVD; No carotid bruits CARDIAC: ***RRR, no murmurs, rubs, gallops RESPIRATORY:  Clear to auscultation without rales, wheezing or rhonchi  ABDOMEN: Soft, non-tender, non-distended EXTREMITIES:  No edema; No deformity  ASSESSMENT AND PLAN: .   Assessment and Plan                 {Are you ordering a CV Procedure (e.g. stress test, cath, DCCV, TEE, etc)?   Press F2        :[id number]}   Follow-up: No follow-ups on file.  Signed, Darryle ONEIDA. Decent, MD, [facility]  [facility]  [address] [place], [place] [address] [phone]  1:42 PM   "

## 2024-09-03 ENCOUNTER — Ambulatory Visit: Admitting: Cardiovascular Disease

## 2024-09-03 ENCOUNTER — Ambulatory Visit: Admitting: Physical Therapy

## 2024-09-03 DIAGNOSIS — I10 Essential (primary) hypertension: Secondary | ICD-10-CM

## 2024-09-03 DIAGNOSIS — I2510 Atherosclerotic heart disease of native coronary artery without angina pectoris: Secondary | ICD-10-CM

## 2024-09-03 DIAGNOSIS — E782 Mixed hyperlipidemia: Secondary | ICD-10-CM

## 2024-09-04 NOTE — Progress Notes (Unsigned)
 " Cardiology Office Note:  .   Date:  09/07/2024  ID:  [name], DOB [birth year], MRN [id number] PCP: [name], FNP  [facility] Providers Cardiologist:  Darryle ONEIDA Decent, MD   History of Present Illness: .    Chief Complaint  Patient presents with   Follow-up    [name] is a 74 y.o. female with below history who presents for follow-up.   History of Present Illness   [name] is a 74 year old female with diabetes, hypertension, hyperlipidemia, and recent stroke who presents for follow-up.  She recently experienced a stroke, resulting in ongoing issues with her left leg and balance, although her left arm has recovered. A loop recorder was placed to monitor for atrial fibrillation, but no arrhythmias have been detected. She is currently on Plavix  75 mg daily.  She has been diagnosed with lung cancer and has completed one round of radiation therapy. A follow-up scan is scheduled in a few weeks to determine the need for further treatment. No chest pain or trouble breathing.  Her diabetes is well-controlled with an A1c of 7.0. She is not currently on any blood pressure medications.  Her hyperlipidemia is managed with Crestor  5 mg daily, and her most recent LDL is 23.           Problem List 1. DM -A1c 7.0 2. HTN 3. HLD -T chol 101, HDL 45, LDL 23, TG 166 4. Tobacco abuse/COPD -minimal COPD -53 years  5. Coronary calcification on CT scan  -minimal LAD calcifications  6. Lung CA -Stage IA SCC L lung  7. CVA -05/2024 -multifocal - cardioembolic? -s/p ILR    ROS: All other ROS reviewed and negative. Pertinent positives noted in the HPI.     Studies Reviewed: [name]       TTE 05/13/2024  1. Left ventricular ejection fraction, by estimation, is 60 to 65%. The  left ventricle has normal function. The left ventricle has no regional  wall motion abnormalities. There is mild concentric left ventricular  hypertrophy. Left ventricular diastolic   parameters are indeterminate.   2. Right ventricular systolic function is normal. The right ventricular  size is normal. Tricuspid regurgitation signal is inadequate for assessing  PA pressure.   3. The mitral valve is degenerative. Trivial mitral valve regurgitation.   4. The aortic valve is tricuspid. Aortic valve regurgitation is not  visualized. Aortic valve sclerosis/calcification is present, without any  evidence of aortic stenosis. Aortic valve mean gradient measures 3.0 mmHg.   5. The inferior vena cava is dilated in size with <50% respiratory  variability, suggesting right atrial pressure of 15 mmHg.  Physical Exam:   VS:  BP 118/78 (BP Location: Left Arm, Patient Position: Sitting, Cuff Size: Normal)   Pulse 90   Ht 5' 4.5 (1.638 m)   Wt 179 lb 3.2 oz (81.3 kg)   SpO2 95%   BMI 30.28 kg/m    Wt Readings from Last 3 Encounters:  09/07/24 179 lb 3.2 oz (81.3 kg)  08/08/24 181 lb (82.1 kg)  08/06/24 178 lb 3.2 oz (80.8 kg)    GEN: Well nourished, well developed in no acute distress NECK: No JVD; No carotid bruits CARDIAC: RRR, no murmurs, rubs, gallops RESPIRATORY:  Clear to auscultation without rales, wheezing or rhonchi  ABDOMEN: Soft, non-tender, non-distended EXTREMITIES:  No edema; No deformity  ASSESSMENT AND PLAN: .   Assessment and Plan    Cryptogenic stroke with loop recorder  monitoring Recent cryptogenic stroke with no atrial fibrillation detected on loop recorder. Family history of atrial fibrillation and stroke. Neurologists suggest possible cardiac origin, but no arrhythmias detected. Watchman implant not recommended due to lack of AFib evidence. - Continue loop recorder monitoring for atrial fibrillation. - Continue clopidogrel  75 mg daily.  Mixed hyperlipidemia Cholesterol levels are well-controlled with current medication regimen. Most recent LDL is 23 mg/dL. - Continue Crestor  5 mg daily.                Follow-up: Return in about 1 year  (around 09/07/2025).  Signed, Darryle DASEN. Barbaraann, MD, [facility]  [facility]  [address] [place], [place] [address] [phone]  2:12 PM   "

## 2024-09-06 ENCOUNTER — Ambulatory Visit: Admitting: Physical Therapy

## 2024-09-06 VITALS — BP 139/86 | HR 81

## 2024-09-06 DIAGNOSIS — I63411 Cerebral infarction due to embolism of right middle cerebral artery: Secondary | ICD-10-CM

## 2024-09-06 DIAGNOSIS — R2681 Unsteadiness on feet: Secondary | ICD-10-CM

## 2024-09-06 DIAGNOSIS — R296 Repeated falls: Secondary | ICD-10-CM

## 2024-09-06 DIAGNOSIS — R278 Other lack of coordination: Secondary | ICD-10-CM

## 2024-09-06 DIAGNOSIS — M6281 Muscle weakness (generalized): Secondary | ICD-10-CM

## 2024-09-06 DIAGNOSIS — R2689 Other abnormalities of gait and mobility: Secondary | ICD-10-CM

## 2024-09-06 NOTE — Therapy (Signed)
 " OUTPATIENT PHYSICAL THERAPY NEURO TREATMENT   Patient Name: [name] MRN: [id number] DOB:November 25, 1950, 74 y.o., female Today's Date: 09/06/2024   PCP: Dyane Laurette RAMAN, FNP REFERRING PROVIDER: Carilyn Prentice BRAVO, MD  END OF SESSION:  PT End of Session - 09/06/24 1449     Visit Number 6    Number of Visits 17   with eval   Date for Recertification  10/16/24   to allow for scheduling delays   Authorization Type BCBS Medicare    PT Start Time 1447    PT Stop Time 1527    PT Time Calculation (min) 40 min    Equipment Utilized During Treatment Gait belt    Activity Tolerance Patient tolerated treatment well    Behavior During Therapy WFL for tasks assessed/performed               Past Medical History:  Diagnosis Date   Alcohol abuse, in remission    Anxiety    Cancer of sigmoid colon (HCC) 07/2005   T2N0M0   Chronic bronchitis    COPD (chronic obstructive pulmonary disease) (HCC)    Depression    Diabetes mellitus without complication (HCC)    GERD (gastroesophageal reflux disease)    History of thyroid  nodule    monitoring   Hyperlipidemia    Hypertension    Migraines    OA (osteoarthritis)    PONV (postoperative nausea and vomiting)    Reflux    Past Surgical History:  Procedure Laterality Date   ABDOMINAL EXPOSURE N/A 11/24/2016   Procedure: ABDOMINAL EXPOSURE;  Surgeon: Krystal JULIANNA Doing, MD;  Location: [facility] OR;  Service: Vascular;  Laterality: N/A;   ABDOMINAL HYSTERECTOMY  1992   age 85 and appendics   ANTERIOR LUMBAR FUSION Bilateral 11/24/2016   Procedure: LUMBAR 5-SACRUM 1 ANTERIOR LUMBAR INTERBODY FUSION;  Surgeon: Oneil Priestly, MD;  Location: [facility] OR;  Service: Orthopedics;  Laterality: Bilateral;  LUMBAR 5-SACRUM 1 ANTERIOR LUMBAR INTERBODY FUSION    ARTHRODESIS METATARSALPHALANGEAL JOINT (MTPJ) Left 08/15/2023   Procedure: ARTHRODESIS METATARSALPHALANGEAL JOINT (MTPJ);  Surgeon: Tobie Franky SQUIBB, DPM;  Location: [facility] ORS;  Service: Orthopedics/Podiatry;   Laterality: Left;  POPLITEAL/SAPHENOUS BLOCK   bilateral breast surgery      benign tumors removed   CHOLECYSTECTOMY  2007   COLON RESECTION  2006   COLONOSCOPY  2019   HAND SURGERY  2010   LOOP RECORDER INSERTION N/A 05/17/2024   Procedure: LOOP RECORDER INSERTION;  Surgeon: Inocencio Soyla Lunger, MD;  Location: [facility] IN[facility]AS[facility];  Service: Cardiovascular;  Laterality: N/A;   repair of incisional hernia  2012   SHOULDER ARTHROSCOPY Left 2009   SHOULDER SURGERY Right 1997   rotator cuff   TONSILLECTOMY     as a child   TOTAL KNEE ARTHROPLASTY Left 08/11/2021   Procedure: TOTAL KNEE ARTHROPLASTY;  Surgeon: Beverley Evalene JONETTA, MD;  Location: [facility] ORS;  Service: Orthopedics;  Laterality: Left;   TOTAL KNEE ARTHROPLASTY Right 06/29/2022   Procedure: TOTAL KNEE ARTHROPLASTY;  Surgeon: Beverley Evalene JONETTA, MD;  Location: [facility] ORS;  Service: Orthopedics;  Laterality: Right;   VIDEO BRONCHOSCOPY WITH ENDOBRONCHIAL NAVIGATION N/A 04/23/2024   Procedure: VIDEO BRONCHOSCOPY WITH ENDOBRONCHIAL NAVIGATION;  Surgeon: Shelah Lamar RAMAN, MD;  Location: [facility] ENDOS[facility];  Service: Pulmonary;  Laterality: N/A;  lingular nodule   Patient Active Problem List   Diagnosis Date Noted   Depression with anxiety 05/21/2024   CVA (cerebral vascular accident) (HCC) 05/17/2024   Acute CVA (cerebrovascular accident) (HCC)  05/12/2024   Gastroesophageal reflux disease 05/11/2024   Drug-induced myopathy 05/11/2024   Diabetic foot ulcer (HCC) 05/11/2024   Benign essential hypertension 05/11/2024   Major depressive disorder, recurrent, moderate (HCC) 05/11/2024   Malignant neoplasm of lung (HCC) 05/11/2024   Thyroid  nodule 05/11/2024   Panic disorder 05/11/2024   Migraine without aura and responsive to treatment 05/11/2024   Recurrent falls 05/11/2024   Stage 2 chronic kidney disease 05/11/2024   Poorly controlled type 2 diabetes mellitus (HCC) 05/11/2024   Acute hypoxemic respiratory failure (HCC) 05/11/2024   Acute  left-sided weakness 05/11/2024   AKI (acute kidney injury) 05/11/2024   Hypokalemia 05/11/2024   Pulmonary nodule 1 cm or greater in diameter 04/23/2024   Lung nodules 04/11/2024   S/P total knee arthroplasty, right 06/29/2022   Multinodular goiter 06/11/2021   Recurrent ventral incisional hernia 06/11/2021   Centrilobular emphysema (HCC) 12/13/2020   Radiculopathy 11/24/2016   H/O colon cancer, stage I 01/19/2013   Nonspecific abnormal electrocardiogram (ECG) (EKG) 03/31/2011   Chest pain 03/31/2011   Hyperlipidemia 03/31/2011   Essential hypertension 03/31/2011   Tobacco abuse 03/31/2011    ONSET DATE: 07/12/2024 (referral date); stroke 05/12/2024  REFERRING DIAG: I69.398,R26.9 (ICD-10-CM) - Gait disturbance, post-stroke  THERAPY DIAG:  Cerebrovascular accident (CVA) due to embolism of right middle cerebral artery (HCC)  Other lack of coordination  Muscle weakness (generalized)  Other abnormalities of gait and mobility  Unsteadiness on feet  Repeated falls  Rationale for Evaluation and Treatment: Rehabilitation  SUBJECTIVE:                                                                                                                                                                                             SUBJECTIVE STATEMENT:  Pt did have a fall down the stairs since last visit shoveling snow off her stairs so that her dog could get outside. She reports that she fell all the way down the stairs and landed in the snow. No pain today.  She was able to finally get a handrail installed indoors on her stairs up to the 2nd floor.  Pt still waiting on her new correct size orthotics, current ones are too short.  Pt notices that her legs will bounce like they are going to give out at times, usually first thing in the morning or if she has been sitting for a while. She feels like this has been getting worse lately.  Pt accompanied by: self, drives herself  PERTINENT  HISTORY: PMH: tobacco abuse, squamous cell carcinoma of the lung (newly diagnosed 04/2024), coronary artery calcification, migraines, COPD, chronic bronchitis, colon cancer, hypertension,  hyperlipidemia, type 2 diabetes, thyroid  nodule, arthritis, migraines, anxiety, depression, and recent stroke.  PAIN:  Are you having pain? No  PRECAUTIONS: Fall and Other: loop recorder  RED FLAGS: None   WEIGHT BEARING RESTRICTIONS: No  FALLS: Has patient fallen in last 6 months? Yes. Number of falls 2 falls since October, was able to get back up herself, no injuries  LIVING ENVIRONMENT: Lives with: lives with their partner; she is caregiver for her partner - MS Lives in: House/apartment Stairs: Yes: Internal: 14 steps; on right going up (except for a short distance across a platform - is working on getting a rail installed here) Has following equipment at home: Single point cane and Environmental Consultant - 2 wheeled  PLOF: Independent with gait, Independent with transfers, and Requires assistive device for independence  PATIENT GOALS: to get a lot more strength in my legs, I felt imbalanced prior to the stroke get my balance back so I don't have to use a cane  OBJECTIVE:  Note: Objective measures were completed at Evaluation unless otherwise noted.  DIAGNOSTIC FINDINGS:  Brain MRI 05/12/2024 IMPRESSION: 1. Widely scattered small Acute infarcts in the right MCA and right MCA/PCA watershed territories. 2. Cytotoxic edema and scattered acute petechial hemorrhage (right parietal and occipital). No malignant hemorrhagic transformation. No intracranial mass effect. 3. Underlying chronic white matter disease.  COGNITION: Overall cognitive status: has some trouble with word-finding at times   SENSATION: N/T in L leg down into foot up to about her knee Mild sensory impairments in RLE per pt report  COORDINATION: Impaired in L hemibody  EDEMA:  Mild in [name]   POSTURE: rounded shoulders, forward head,  and posterior pelvic tilt  LOWER EXTREMITY ROM:   WFL  Active  Right Eval Left Eval  Hip flexion    Hip extension    Hip abduction    Hip adduction    Hip internal rotation    Hip external rotation    Knee flexion    Knee extension    Ankle dorsiflexion    Ankle plantarflexion    Ankle inversion    Ankle eversion     (Blank rows = not tested)  LOWER EXTREMITY MMT:    MMT Right Eval Left Eval  Hip flexion 4+ 3+  Hip extension    Hip abduction    Hip adduction    Hip internal rotation    Hip external rotation    Knee flexion 4+ 3+  Knee extension 4+ 3+  Ankle dorsiflexion 4+ 4  Ankle plantarflexion    Ankle inversion    Ankle eversion    (Blank rows = not tested)  BED MOBILITY:  Sleeping in chair currently as her bed is upstairs; may have to get a bedrail when she gets back to her bed, her bed is tall off the floor  TRANSFERS: Sit to stand: Modified independence  Assistive device utilized: None     Stand to sit: Modified independence  Assistive device utilized: None     Chair to chair: Modified independence  Assistive device utilized: None       STAIRS: STAIRS:  Level of Assistance: SBA  Stair Negotiation Technique: Step to Pattern with Bilateral Rails  Number of Stairs: 6   Height of Stairs: 4  Comments: step to gait pattern, slow and cautious  GAIT: Findings:  Gait pattern: path deviation - weaving and deviation to the L, decreased hip/knee flexion- Left, and decreased ankle dorsiflexion- Left Distance walked: various clinic distances Assistive device utilized:  Single point cane Level of assistance: SBA   FUNCTIONAL TESTS:    [facility] PT Assessment - 09/06/24 1513       Ambulation/Gait   Gait velocity 32.8 ft over 11.44 sec = 2.87 ft/sec   with SPC     Functional Gait  Assessment   Gait assessed  Yes    Gait Level Surface Walks 20 ft in less than 7 sec but greater than 5.5 sec, uses assistive device, slower speed, mild gait deviations, or deviates  6-10 in outside of the 12 in walkway width.    Change in Gait Speed Able to change speed, demonstrates mild gait deviations, deviates 6-10 in outside of the 12 in walkway width, or no gait deviations, unable to achieve a major change in velocity, or uses a change in velocity, or uses an assistive device.    Gait with Horizontal Head Turns Performs head turns smoothly with slight change in gait velocity (eg, minor disruption to smooth gait path), deviates 6-10 in outside 12 in walkway width, or uses an assistive device.    Gait with Vertical Head Turns Performs task with slight change in gait velocity (eg, minor disruption to smooth gait path), deviates 6 - 10 in outside 12 in walkway width or uses assistive device    Gait and Pivot Turn Pivot turns safely in greater than 3 sec and stops with no loss of balance, or pivot turns safely within 3 sec and stops with mild imbalance, requires small steps to catch balance.    Step Over Obstacle Is able to step over one shoe box (4.5 in total height) without changing gait speed. No evidence of imbalance.    Gait with Narrow Base of Support Ambulates less than 4 steps heel to toe or cannot perform without assistance.    Gait with Eyes Closed Walks 20 ft, slow speed, abnormal gait pattern, evidence for imbalance, deviates 10-15 in outside 12 in walkway width. Requires more than 9 sec to ambulate 20 ft.    Ambulating Backwards Walks 20 ft, uses assistive device, slower speed, mild gait deviations, deviates 6-10 in outside 12 in walkway width.    Steps Alternating feet, must use rail.    Total Score 17    FGA comment: 17/30, high fall risk         VITALS  Vitals:   09/06/24 1459  BP: 139/86  Pulse: 81                                                                                                                                   TREATMENT DATE:    Self-Care/Home Management Assessed in [name] while seated at rest (see above) and BP WNL.     TherAct/NMR For review of HEP: staggered sit to stands  Physical Performance For STG assessment  [facility] PT Assessment - 09/06/24 1513       Ambulation/Gait   Gait velocity 32.8 ft over  11.44 sec = 2.87 ft/sec   with [facility]     Functional Gait  Assessment   Gait assessed  Yes    Gait Level Surface Walks 20 ft in less than 7 sec but greater than 5.5 sec, uses assistive device, slower speed, mild gait deviations, or deviates 6-10 in outside of the 12 in walkway width.    Change in Gait Speed Able to change speed, demonstrates mild gait deviations, deviates 6-10 in outside of the 12 in walkway width, or no gait deviations, unable to achieve a major change in velocity, or uses a change in velocity, or uses an assistive device.    Gait with Horizontal Head Turns Performs head turns smoothly with slight change in gait velocity (eg, minor disruption to smooth gait path), deviates 6-10 in outside 12 in walkway width, or uses an assistive device.    Gait with Vertical Head Turns Performs task with slight change in gait velocity (eg, minor disruption to smooth gait path), deviates 6 - 10 in outside 12 in walkway width or uses assistive device    Gait and Pivot Turn Pivot turns safely in greater than 3 sec and stops with no loss of balance, or pivot turns safely within 3 sec and stops with mild imbalance, requires small steps to catch balance.    Step Over Obstacle Is able to step over one shoe box (4.5 in total height) without changing gait speed. No evidence of imbalance.    Gait with Narrow Base of Support Ambulates less than 4 steps heel to toe or cannot perform without assistance.    Gait with Eyes Closed Walks 20 ft, slow speed, abnormal gait pattern, evidence for imbalance, deviates 10-15 in outside 12 in walkway width. Requires more than 9 sec to ambulate 20 ft.    Ambulating Backwards Walks 20 ft, uses assistive device, slower speed, mild gait deviations, deviates 6-10 in outside 12 in walkway  width.    Steps Alternating feet, must use rail.    Total Score 17    FGA comment: 17/30, high fall risk             PATIENT EDUCATION: Education details: Continue HEP, results of OM and functional implications, schedule OT eval at front desk after appt today Person educated: Patient Education method: Explanation and Demonstration Education comprehension: verbalized understanding, returned demonstration, and needs further education  HOME EXERCISE PROGRAM: Access Code: [email or web address] URL: [email or web address] Date: 08/13/2024 Prepared by: Waddell Southgate  Exercises - Staggered Sit-to-Stand  - 1 x daily - 7 x weekly - 3 sets - 10 reps   CIR HEP: standing hip abd, hip flexion, hip ext, standing marches, running in place, squats  GOALS: Goals reviewed with patient? Yes  SHORT TERM GOALS: Target date: 09/04/2024   Pt will be independent with initial HEP for improved strength, balance, transfers and gait. Baseline: pt needs cues and review of HEP this visit (2/5) Goal status: IN PROGRESS  2.  Pt will improve gait velocity to at least 2.5 ft/sec for improved gait efficiency and performance at SBA level  Baseline: 2.29 ft/sec SBA no AD (1/6), 2.87 ft/sec SBA no AD (2/5) Goal status: MET  3.  Pt will improve FGA to 18/30 for decreased fall risk  Baseline: 14/30 (1/6), 17/30 (2/5) Goal status: IN PROGRESS  4.  Pt will be able to navigate up/down a flight of stairs with one handrail at mod I level with step-to gait pattern Baseline:  Goal status: MET   LONG TERM  GOALS: Target date: 10/02/2024   Pt will be independent with final HEP for improved strength, balance, transfers and gait. Baseline:  Goal status: INITIAL  2.  Pt will improve gait velocity to at least 3.0 ft/sec for improved gait efficiency and performance at mod I level  Baseline: 2.29 ft/sec SBA no AD (1/6), 2.87 ft/sec SBA no AD (2/5) Goal status: REVISED/UPGRADED  3.  Pt will improve FGA to 22/30  for decreased fall risk  Baseline: 14/30 (1/6), 17/30 (2/5) Goal status: INITIAL  4.  Pt will be able to navigate up/down a flight of stairs with one handrail at mod I level with alternating gait pattern Baseline:  Goal status: INITIAL  5.  mCTSIB to be assessed and LTG set Baseline: 120/120 (1/12) Goal status: D/C GOAL due to high score    ASSESSMENT:  CLINICAL IMPRESSION: Emphasis of skilled PT session on continuing to assess vitals, assessing STG, and reviewing her current HEP. Her vitals remain WNL this visit. She has met 2/4 STG and is making progress towards remaining 2/4 STG. She improved her gait speed from 2.29 ft/sec initially to 2.87 ft/sec this visit, exceeding her STG. She also was able to navigate a flight of stairs with one handrail at mod I level with step-to gait pattern. She is making progress towards being independent with her initial HEP and towards improving her FGA score. She did improve her FGA score though not quite enough to meet her STG. She does need cues and review of her initial HEP this visit due to some confusion with how to perform exercise correctly in order to maximize [name] activation. She continues to remain a high fall risk however due to her recent fall at home down the stairs in the snow. She continues to benefit from skilled PT services to work towards increasing her functional strength and improving her balance in order to increase her safety and independence with functional mobility and decrease her fall risk. Continue POC.   OBJECTIVE IMPAIRMENTS: Abnormal gait, decreased activity tolerance, decreased balance, decreased cognition, decreased coordination, decreased endurance, difficulty walking, decreased strength, impaired sensation, impaired UE functional use, impaired vision/preception, and postural dysfunction.   ACTIVITY LIMITATIONS: carrying, lifting, bending, squatting, stairs, bed mobility, and caring for others  PARTICIPATION LIMITATIONS:  cleaning, laundry, and community activity  PERSONAL FACTORS: Fitness and 3+ comorbidities:   tobacco abuse, squamous cell carcinoma of the lung (newly diagnosed 04/2024), coronary artery calcification, migraines, COPD, chronic bronchitis, colon cancer, hypertension, hyperlipidemia, type 2 diabetes, thyroid  nodule, arthritis, migraines, anxiety, depression, and recent stroke.are also affecting patient's functional outcome.   REHAB POTENTIAL: Good  CLINICAL DECISION MAKING: Evolving/moderate complexity  EVALUATION COMPLEXITY: Moderate  PLAN:  PT FREQUENCY: 2x/week  PT DURATION: 8 weeks  PLANNED INTERVENTIONS: 97164- PT Re-evaluation, 97750- Physical Performance Testing, 97110-Therapeutic exercises, 97530- Therapeutic activity, 97112- Neuromuscular re-education, 97535- Self Care, [id number]- Manual therapy, [phone]- Gait training, [phone]- Orthotic Initial, [phone]- Orthotic/Prosthetic subsequent, Patient/Family education, Balance training, Stair training, Taping, Joint mobilization, Visual/preceptual remediation/compensation, Cognitive remediation, DME instructions, Cryotherapy, and Moist heat  PLAN FOR NEXT SESSION: CHECK BP, add to HEP as needed to work on [name] NMR/strengthening and balance impairments; floor transfers, stairs, stepping/ankle strategy, glute strengthening   Waddell Southgate, PT Waddell Southgate, PT, DPT, CSRS   09/06/2024, 3:28 PM        "

## 2024-09-07 ENCOUNTER — Encounter: Payer: Self-pay | Admitting: Cardiovascular Disease

## 2024-09-07 ENCOUNTER — Ambulatory Visit: Admitting: Cardiovascular Disease

## 2024-09-07 VITALS — BP 118/78 | HR 90 | Ht 64.5 in | Wt 179.2 lb

## 2024-09-07 DIAGNOSIS — I10 Essential (primary) hypertension: Secondary | ICD-10-CM

## 2024-09-07 DIAGNOSIS — E782 Mixed hyperlipidemia: Secondary | ICD-10-CM

## 2024-09-07 DIAGNOSIS — I63411 Cerebral infarction due to embolism of right middle cerebral artery: Secondary | ICD-10-CM

## 2024-09-07 NOTE — Patient Instructions (Signed)
 Medication Instructions:  Your physician recommends that you continue on your current medications as directed. Please refer to the Current Medication list given to you today.  *If you need a refill on your cardiac medications before your next appointment, please call your pharmacy*   Follow-Up: At [facility], you and your health needs are our priority.  As part of our continuing mission to provide you with exceptional heart care, our providers are all part of one team.  This team includes your primary Cardiologist (physician) and Advanced Practice Providers or APPs (Physician Assistants and Nurse Practitioners) who all work together to provide you with the care you need, when you need it.  Your next appointment:   12 month(s)  Provider:   Darryle ONEIDA Decent, MD or One of our Advanced Practice Providers (APPs): Morse Clause, PA-C  Hanh Waddell Daniels, PA-C  Saddie Cleaves, NP  Olivia Pavy, PA-C Miriam Shams, NP  Leontine Salen, PA-C Josefa Beauvais, NP  [facility], PA-C [place], PA-C  [place], PA-C [place], [place]  Damien Braver, NP Jon Hails, PA-C  Waddell Donath, PA-C Dayna Dunn, PA-C  [place], PA-C [place], [place] Glendia Ferrier, PA-C Callie Goodrich, PA-C  Katlyn West, NP Thom Sluder, PA-C  Alyssa White, NP Rollo Louder, PA-C Xika Zhao, NP    Lamarr Satterfield, NP                 We recommend signing up for the patient portal called MyChart.  Sign up information is provided on this After Visit Summary.  MyChart is used to connect with patients for Virtual Visits (Telemedicine).  Patients are able to view lab/test results, encounter notes, upcoming appointments, etc.  Non-urgent messages can be sent to your provider as well.   To learn more about what you can do with MyChart, go to [email or web address].   Other Instructions

## 2024-09-11 ENCOUNTER — Ambulatory Visit: Admitting: Physical Therapy

## 2024-09-13 ENCOUNTER — Ambulatory Visit: Admitting: Physical Therapy

## 2024-09-17 ENCOUNTER — Ambulatory Visit: Admitting: Physical Therapy

## 2024-09-20 ENCOUNTER — Ambulatory Visit: Admitting: Physical Therapy

## 2024-09-23 ENCOUNTER — Ambulatory Visit

## 2024-09-24 ENCOUNTER — Ambulatory Visit

## 2024-09-24 ENCOUNTER — Ambulatory Visit: Admitting: Physical Therapy

## 2024-09-27 ENCOUNTER — Ambulatory Visit: Admitting: Physical Therapy

## 2024-10-01 ENCOUNTER — Ambulatory Visit (HOSPITAL_COMMUNITY)

## 2024-10-02 ENCOUNTER — Ambulatory Visit: Admitting: Occupational Therapy

## 2024-10-02 ENCOUNTER — Ambulatory Visit: Admitting: Physical Therapy

## 2024-10-03 ENCOUNTER — Ambulatory Visit: Admitting: Physical Therapy

## 2024-10-04 ENCOUNTER — Ambulatory Visit: Admitting: Radiation Oncology

## 2024-10-04 ENCOUNTER — Ambulatory Visit

## 2024-10-04 ENCOUNTER — Ambulatory Visit: Payer: Self-pay | Admitting: Physical Therapy

## 2024-10-09 ENCOUNTER — Ambulatory Visit: Admitting: Occupational Therapy

## 2024-10-09 ENCOUNTER — Ambulatory Visit: Payer: Self-pay | Admitting: Physical Therapy

## 2024-10-11 ENCOUNTER — Encounter: Admitting: Physical Medicine & Rehabilitation

## 2024-10-11 ENCOUNTER — Ambulatory Visit: Payer: Self-pay | Admitting: Physical Therapy

## 2024-10-11 ENCOUNTER — Ambulatory Visit

## 2024-10-16 ENCOUNTER — Ambulatory Visit: Admitting: Occupational Therapy

## 2024-10-16 ENCOUNTER — Ambulatory Visit: Payer: Self-pay | Admitting: Physical Therapy

## 2024-10-16 ENCOUNTER — Inpatient Hospital Stay: Attending: Internal Medicine

## 2024-10-18 ENCOUNTER — Ambulatory Visit: Payer: Self-pay | Admitting: Physical Therapy

## 2024-10-18 ENCOUNTER — Ambulatory Visit

## 2024-10-23 ENCOUNTER — Inpatient Hospital Stay: Admitting: Internal Medicine

## 2024-10-24 ENCOUNTER — Ambulatory Visit

## 2024-11-07 ENCOUNTER — Ambulatory Visit: Admitting: Podiatry

## 2024-11-24 ENCOUNTER — Ambulatory Visit

## 2024-12-25 ENCOUNTER — Ambulatory Visit

## 2025-01-25 ENCOUNTER — Ambulatory Visit

## 2025-02-25 ENCOUNTER — Ambulatory Visit

## 2025-03-28 ENCOUNTER — Ambulatory Visit

## 2025-04-28 ENCOUNTER — Ambulatory Visit

## 2025-05-29 ENCOUNTER — Ambulatory Visit

## 2025-06-29 ENCOUNTER — Ambulatory Visit

## 2025-07-30 ENCOUNTER — Ambulatory Visit
